# Patient Record
Sex: Female | Born: 1960
Health system: Southern US, Community
[De-identification: ages and names within clinical notes are randomized; demographics above are authoritative.]

## PROBLEM LIST (undated history)

## (undated) DIAGNOSIS — K529 Noninfective gastroenteritis and colitis, unspecified: Secondary | ICD-10-CM

## (undated) DIAGNOSIS — Z923 Personal history of irradiation: Secondary | ICD-10-CM

## (undated) DIAGNOSIS — J45909 Unspecified asthma, uncomplicated: Secondary | ICD-10-CM

## (undated) DIAGNOSIS — M1990 Unspecified osteoarthritis, unspecified site: Secondary | ICD-10-CM

## (undated) DIAGNOSIS — E119 Type 2 diabetes mellitus without complications: Secondary | ICD-10-CM

## (undated) DIAGNOSIS — I10 Essential (primary) hypertension: Secondary | ICD-10-CM

## (undated) DIAGNOSIS — C801 Malignant (primary) neoplasm, unspecified: Secondary | ICD-10-CM

## (undated) DIAGNOSIS — G629 Polyneuropathy, unspecified: Secondary | ICD-10-CM

## (undated) HISTORY — PX: TONSILLECTOMY: SUR1361

## (undated) HISTORY — DX: Personal history of irradiation: Z92.3

## (undated) HISTORY — PX: TOTAL HIP ARTHROPLASTY: SHX124

## (undated) HISTORY — PX: ABDOMINAL HYSTERECTOMY: SHX81

## (undated) HISTORY — DX: Noninfective gastroenteritis and colitis, unspecified: K52.9

## (undated) HISTORY — PX: KNEE ARTHROSCOPY: SUR90

## (undated) HISTORY — PX: DILATION AND CURETTAGE OF UTERUS: SHX78

---

## 1998-03-15 ENCOUNTER — Other Ambulatory Visit: Admission: RE | Admit: 1998-03-15 | Discharge: 1998-03-15 | Payer: Self-pay | Admitting: Gynecology

## 2001-01-06 ENCOUNTER — Other Ambulatory Visit: Admission: RE | Admit: 2001-01-06 | Discharge: 2001-01-06 | Payer: Self-pay | Admitting: Gynecology

## 2002-09-16 ENCOUNTER — Other Ambulatory Visit: Admission: RE | Admit: 2002-09-16 | Discharge: 2002-09-16 | Payer: Self-pay | Admitting: Gynecology

## 2002-09-18 ENCOUNTER — Other Ambulatory Visit: Admission: RE | Admit: 2002-09-18 | Discharge: 2002-09-18 | Payer: Self-pay | Admitting: Obstetrics and Gynecology

## 2003-03-02 ENCOUNTER — Other Ambulatory Visit: Admission: RE | Admit: 2003-03-02 | Discharge: 2003-03-02 | Payer: Self-pay | Admitting: Gynecology

## 2003-10-19 ENCOUNTER — Other Ambulatory Visit: Admission: RE | Admit: 2003-10-19 | Discharge: 2003-10-19 | Payer: Self-pay | Admitting: Gynecology

## 2004-10-06 ENCOUNTER — Ambulatory Visit: Payer: Self-pay | Admitting: Gastroenterology

## 2004-10-13 ENCOUNTER — Ambulatory Visit: Payer: Self-pay | Admitting: Gastroenterology

## 2004-10-13 ENCOUNTER — Encounter (INDEPENDENT_AMBULATORY_CARE_PROVIDER_SITE_OTHER): Payer: Self-pay | Admitting: *Deleted

## 2004-10-23 ENCOUNTER — Other Ambulatory Visit: Admission: RE | Admit: 2004-10-23 | Discharge: 2004-10-23 | Payer: Self-pay | Admitting: Gynecology

## 2004-11-13 ENCOUNTER — Ambulatory Visit: Payer: Self-pay | Admitting: Gastroenterology

## 2007-07-23 ENCOUNTER — Encounter (INDEPENDENT_AMBULATORY_CARE_PROVIDER_SITE_OTHER): Payer: Self-pay | Admitting: Obstetrics and Gynecology

## 2007-07-23 ENCOUNTER — Inpatient Hospital Stay (HOSPITAL_COMMUNITY): Admission: RE | Admit: 2007-07-23 | Discharge: 2007-07-25 | Payer: Self-pay | Admitting: Obstetrics and Gynecology

## 2008-08-07 ENCOUNTER — Encounter: Admission: RE | Admit: 2008-08-07 | Discharge: 2008-08-07 | Payer: Self-pay | Admitting: Orthopedic Surgery

## 2009-01-04 ENCOUNTER — Inpatient Hospital Stay (HOSPITAL_COMMUNITY): Admission: RE | Admit: 2009-01-04 | Discharge: 2009-01-08 | Payer: Self-pay | Admitting: Orthopedic Surgery

## 2009-09-09 ENCOUNTER — Encounter (INDEPENDENT_AMBULATORY_CARE_PROVIDER_SITE_OTHER): Payer: Self-pay | Admitting: *Deleted

## 2010-02-28 NOTE — Letter (Signed)
Summary: Colonoscopy Letter  [facility]  [place], [place] [address]   Phone: [phone]  Fax: [phone]      September 09, 2009 MRN: [id number]   [facility] [place] [address] [place], [place]  [address]   Dear Ms. PATIL[place]-Burkes,   According to your medical record, it is time for you to schedule a Colonoscopy. The American Cancer Society recommends this procedure as a method to detect early colon cancer. Patients with a family history of colon cancer, or a personal history of colon polyps or inflammatory bowel disease are at increased risk.  This letter has beeen generated based on the recommendations made at the time of your procedure. If you feel that in your particular situation this may no longer apply, please contact our office.  Please call our office at [phone] to schedule this appointment or to update your records at your earliest convenience.  Thank you for cooperating with [place] to provide you with the very best care possible.   Sincerely,  Norberto Sorenson T. Fuller Plan, M.D.  [facility] [phone]

## 2010-05-02 LAB — PROTIME-INR
INR: 1.4 (ref 0.00–1.49)
INR: 1.56 — ABNORMAL HIGH (ref 0.00–1.49)
Prothrombin Time: 18.5 seconds — ABNORMAL HIGH (ref 11.6–15.2)

## 2010-05-02 LAB — HEMOGLOBIN AND HEMATOCRIT, BLOOD
HCT: 31.5 % — ABNORMAL LOW (ref 36.0–46.0)
Hemoglobin: 10.5 g/dL — ABNORMAL LOW (ref 12.0–15.0)
Hemoglobin: 10.7 g/dL — ABNORMAL LOW (ref 12.0–15.0)
Hemoglobin: 11.7 g/dL — ABNORMAL LOW (ref 12.0–15.0)

## 2010-05-02 LAB — URINALYSIS, ROUTINE W REFLEX MICROSCOPIC
Hgb urine dipstick: NEGATIVE
Nitrite: NEGATIVE
Protein, ur: NEGATIVE mg/dL
Specific Gravity, Urine: 1.023 (ref 1.005–1.030)
Urobilinogen, UA: 0.2 mg/dL (ref 0.0–1.0)

## 2010-05-02 LAB — TYPE AND SCREEN
ABO/RH(D): O POS
Antibody Screen: NEGATIVE

## 2010-05-02 LAB — DIFFERENTIAL
Eosinophils Relative: 1 % (ref 0–5)
Lymphocytes Relative: 35 % (ref 12–46)
Lymphs Abs: 2.7 10*3/uL (ref 0.7–4.0)
Monocytes Absolute: 0.5 10*3/uL (ref 0.1–1.0)
Monocytes Relative: 6 % (ref 3–12)

## 2010-05-02 LAB — COMPREHENSIVE METABOLIC PANEL
ALT: 27 U/L (ref 0–35)
AST: 25 U/L (ref 0–37)
Albumin: 3.6 g/dL (ref 3.5–5.2)
Calcium: 9.2 mg/dL (ref 8.4–10.5)
Creatinine, Ser: 1.22 mg/dL — ABNORMAL HIGH (ref 0.4–1.2)
GFR calc Af Amer: 56 mL/min — ABNORMAL LOW (ref >60)
Sodium: 140 mEq/L (ref 135–145)

## 2010-05-02 LAB — CBC
MCHC: 33.3 g/dL (ref 30.0–36.0)
MCV: 88.5 fL (ref 78.0–100.0)
Platelets: 222 10*3/uL (ref 150–400)
WBC: 7.6 10*3/uL (ref 4.0–10.5)

## 2010-05-02 LAB — ABO/RH: ABO/RH(D): O POS

## 2010-06-13 NOTE — Op Note (Signed)
NAME:  Brandi Dickson, Brandi Dickson     ACCOUNT NO.:  [id number]   MEDICAL RECORD NO.:  [id number]          PATIENT TYPE:  INP   LOCATION:  9303                          FACILITY:  [facility]   PHYSICIAN:  Gus Height, M.D.       DATE OF BIRTH:  09/28/1960   DATE OF PROCEDURE:  07/23/2007  DATE OF DISCHARGE:                               OPERATIVE REPORT   PREOPERATIVE DIAGNOSES:  1. Menorrhagia.  2. Uterine fibroids.   POSTOPERATIVE DIAGNOSES:  1. Menorrhagia.  2. Uterine fibroids.   PROCEDURES:  Total abdominal hysterectomy, bilateral salpingo-  oophorectomy.   SURGEON:  Gus Height, MD.   ASSISTANT:  Barbaraann Rondo, MD.   ANESTHESIA:  General.   COMPLICATIONS:  None.   JUSTIFICATION:  The patient is a 50 year old white female with a long  history of menorrhagia and history of uterine fibroids.  The bleeding  and size and pain are associated with the fibroids, which is now  becoming an issue with the patient's quality of life and she has  requested definitive procedure be carried out to correct this problem.  Risks and benefits of total abdominal hysterectomy were discussed with  the patient and husband and informed consent has been obtained.   PROCEDURE:  The patient was taken to the operating room and placed in  supine position.  General anesthesia was administered without  difficulty.  She was then prepped and draped in the usual sterile  fashion.  Foley catheter was inserted.  At this point, a Pfannenstiel  incision was made, extended through the subcutaneous tissue with  bleeding points being clamped and coagulation were encountered.  The  fascia was then identified, incised transversely, and separated from the  underlying rectus muscles.  The rectus muscles were divided in midline.  The peritoneum was then found and entered carefully underlying  structures.  At this point, a self-retaining retractor was placed  through the wound.  The viscera was packed out of pelvis and  inspection  revealed a markedly enlarged uterus approximately 14-16 weeks'  equivalent size with many intramural and subserosal uterine fibroids,  fibroids extending into the broad ligament and onto the anterior lower  uterine segment.  At this point, the round ligaments were identified,  clamped, cut, and suture ligated using suture ligatures of 0 Vicryl and  a bladder flap was created anteriorly.  Perforations were then made  below the utero-ovarian ligaments.  The infundibulopelvic ligament was  then found as were the ureters where the ureters were identified and the  infundibulopelvic ligaments were clamped, cut, and suture ligated using  suture ligatures of 0 Vicryl.  The broad ligaments were then  skeletonized and additional bites were taken of the broad ligament  structures using curved Heaney clamps.  Uterine vessels were then found,  clamped, cut, and suture ligated in similar fashion.  At this point in  an effort to debulk the uterus to allow the surgery to proceed more  smoothly, the fundus was cut free from the cervix.  The cervix was then  grasped with Kocher clamps and held.  At this point, the bladder flap  was taken down further with sharp  dissection.  Then, using straight  Heaney clamps, serial bites were taken of the paracervical fascia.  All  pedicles were cut and suture ligated using suture ligatures of 0 Vicryl.  The cardinal ligaments and uterosacral ligaments were then clamped in  sequence using curved Heaney clamps.  Pedicles were cut and suture  ligated using suture ligatures of 0 Vicryl.  Once this was done, the  reflection of the cervix on the vagina was found using curved Heaney  clamps.  This was cross-cut.  Specimen of the cervix was excised.  The  cuff was then closed using 3-way suture of 0 Vicryl with the uterosacral  ligaments incorporated for support and then using interrupted stitch of  0 Vicryl sutures.  Inspection was made for hemostasis.  Hemostasis   appeared to be excellent.  At this point, lap counts and instrument  counts were taken down to be correct.  The abdomen was irrigated with  warm saline and then the abdomen was closed.  Parietal peritoneum was  closed using running continuous 0 Vicryl suture.  Rectus muscles were  reapproximated using running continuous 0 Vicryl suture.  The fascia was  then closed using two sutures of 0 Vicryl each starting at the lateral  fascial angles and meeting in the midline.  Subcutaneous tissue was  closed using interrupted 0 Vicryl sutures and the skin incision was  closed using subcuticular 3-0 Vicryl.  Estimated blood loss was  approximately 350 mL.  The patient tolerated the procedure well and went  to the recovery room in satisfactory condition.      Gus Height, M.D.  Electronically Signed     AR/MEDQ  D:  07/23/2007  T:  07/24/2007  Job:  [id number]

## 2010-06-16 NOTE — Discharge Summary (Signed)
NAME:  Brandi Dickson, Brandi Dickson     ACCOUNT NO.:  [id number]   MEDICAL RECORD NO.:  [id number]          PATIENT TYPE:  INP   LOCATION:  9303                          FACILITY:  [facility]   PHYSICIAN:  Gus Height, M.D.       DATE OF BIRTH:  November 15, 1960   DATE OF ADMISSION:  07/23/2007  DATE OF DISCHARGE:  07/25/2007                               DISCHARGE SUMMARY   ADMISSION DIAGNOSIS:  Symptomatic uterine fibroids.   OPERATIONS AND PROCEDURES:  Total abdominal hysterectomy and bilateral  salpingo-oophorectomy.   BRIEF HISTORY:  The patient is a 50 year old white female.  She is a  patient of Dr. Delene Loll who on clinical examination was noted to have  large uterine fibroids associated with pelvic pain and menorrhagia.  Due  to the symptoms associated with the large uterine fibroids, it was  decided that the patient was to undergo definitive therapy via total  abdominal hysterectomy and bilateral salpingo-oophorectomy and the risks  and benefits of this procedure were discussed with the patient.  She was  admitted on July 23, 2007.  After informed consent was obtained, the  patient underwent laparotomy with total abdominal hysterectomy and  bilateral salpingo-oophorectomy.  At the time laparotomy, the patient  was noted to have multiple large uterine fibroids both subserosal and  intramural.  The hysterectomy was carried without difficulty with  associated bilateral salpingo-oophorectomy and the patient underwent an  essentially uncomplicated postoperative course tolerating increasing  ambulation and diet well.  She was able to be discharged home by the  second postoperative day.  She remained afebrile.  Her hemoglobin  remained stable.  She was able to take a regular diet and void  spontaneously on her own.  The final pathology report on the  hysterectomy specimen revealed chronic cervicitis, benign proliferative  endometrium, multiple leiomyomas, the largest being 7 cm.  The right  ovary and tube revealed atrophic ovarian parenchyma.  The left ovary  revealed a hemorrhagic corpus luteum and a benign ovarian fibroma.  The  patient was discharged home on Tylox 1 every 3 or 4 hours as needed for  pain.  She was instructed to do no heavy lifting.  Call for any problems  such as fever, pain, or heavy bleeding.  She will be seen back in 4  weeks for followup examination.      Gus Height, M.D.  Electronically Signed     AR/MEDQ  D:  07/31/2007  T:  07/31/2007  Job:  [id number]

## 2010-10-26 LAB — URINALYSIS, ROUTINE W REFLEX MICROSCOPIC
Bilirubin Urine: NEGATIVE
Ketones, ur: NEGATIVE
Nitrite: NEGATIVE
Specific Gravity, Urine: 1.01
Urobilinogen, UA: 0.2

## 2010-10-26 LAB — COMPREHENSIVE METABOLIC PANEL
AST: 26
CO2: 31
Calcium: 9.2
Creatinine, Ser: 0.66
GFR calc Af Amer: >60
GFR calc non Af Amer: >60

## 2010-10-26 LAB — CBC
MCHC: 33
MCHC: 33.2
MCHC: 33.5
MCV: 89.6
MCV: 90.6
RBC: 3.69 — ABNORMAL LOW
RBC: 4.81
RDW: 13.4
RDW: 13.5
RDW: 13.6

## 2010-10-26 LAB — PREGNANCY, URINE: Preg Test, Ur: NEGATIVE

## 2010-10-26 LAB — PROTIME-INR: Prothrombin Time: 12.1

## 2010-10-26 LAB — APTT: aPTT: 29

## 2011-10-03 ENCOUNTER — Encounter: Payer: Self-pay | Admitting: Gastroenterology

## 2013-03-03 ENCOUNTER — Other Ambulatory Visit (HOSPITAL_BASED_OUTPATIENT_CLINIC_OR_DEPARTMENT_OTHER): Payer: Self-pay | Admitting: Physician Assistant

## 2013-03-03 DIAGNOSIS — R229 Localized swelling, mass and lump, unspecified: Secondary | ICD-10-CM

## 2013-03-04 ENCOUNTER — Ambulatory Visit (HOSPITAL_BASED_OUTPATIENT_CLINIC_OR_DEPARTMENT_OTHER)
Admission: RE | Admit: 2013-03-04 | Discharge: 2013-03-04 | Disposition: A | Discharge status: Home / Self Care | Payer: 59 | Source: Ambulatory Visit | Attending: Physician Assistant | Admitting: Physician Assistant

## 2013-03-04 ENCOUNTER — Ambulatory Visit (HOSPITAL_BASED_OUTPATIENT_CLINIC_OR_DEPARTMENT_OTHER): Payer: 59

## 2013-03-04 DIAGNOSIS — R229 Localized swelling, mass and lump, unspecified: Secondary | ICD-10-CM

## 2013-03-04 DIAGNOSIS — R222 Localized swelling, mass and lump, trunk: Secondary | ICD-10-CM | POA: Insufficient documentation

## 2014-09-21 ENCOUNTER — Emergency Department (HOSPITAL_COMMUNITY): Payer: 59

## 2014-09-21 ENCOUNTER — Emergency Department (HOSPITAL_COMMUNITY)
Admission: EM | Admit: 2014-09-21 | Discharge: 2014-09-21 | Disposition: A | Discharge status: Home / Self Care | Payer: 59 | Attending: Emergency Medicine | Admitting: Emergency Medicine

## 2014-09-21 ENCOUNTER — Encounter (HOSPITAL_COMMUNITY): Payer: Self-pay | Admitting: Emergency Medicine

## 2014-09-21 DIAGNOSIS — Z8669 Personal history of other diseases of the nervous system and sense organs: Secondary | ICD-10-CM | POA: Diagnosis not present

## 2014-09-21 DIAGNOSIS — E876 Hypokalemia: Secondary | ICD-10-CM

## 2014-09-21 DIAGNOSIS — R0789 Other chest pain: Secondary | ICD-10-CM

## 2014-09-21 DIAGNOSIS — R079 Chest pain, unspecified: Secondary | ICD-10-CM | POA: Diagnosis present

## 2014-09-21 DIAGNOSIS — E119 Type 2 diabetes mellitus without complications: Secondary | ICD-10-CM | POA: Diagnosis not present

## 2014-09-21 DIAGNOSIS — I10 Essential (primary) hypertension: Secondary | ICD-10-CM | POA: Insufficient documentation

## 2014-09-21 DIAGNOSIS — R112 Nausea with vomiting, unspecified: Secondary | ICD-10-CM | POA: Diagnosis not present

## 2014-09-21 DIAGNOSIS — R002 Palpitations: Secondary | ICD-10-CM | POA: Diagnosis not present

## 2014-09-21 HISTORY — DX: Polyneuropathy, unspecified: G62.9

## 2014-09-21 HISTORY — DX: Type 2 diabetes mellitus without complications: E11.9

## 2014-09-21 HISTORY — DX: Essential (primary) hypertension: I10

## 2014-09-21 LAB — BASIC METABOLIC PANEL
ANION GAP: 10 (ref 5–15)
BUN: 19 mg/dL (ref 6–20)
CALCIUM: 9 mg/dL (ref 8.9–10.3)
CO2: 24 mmol/L (ref 22–32)
CREATININE: 1.03 mg/dL — AB (ref 0.44–1.00)
Chloride: 100 mmol/L — ABNORMAL LOW (ref 101–111)
Glucose, Bld: 139 mg/dL — ABNORMAL HIGH (ref 65–99)
Potassium: 3.3 mmol/L — ABNORMAL LOW (ref 3.5–5.1)
SODIUM: 134 mmol/L — AB (ref 135–145)

## 2014-09-21 LAB — TROPONIN I

## 2014-09-21 LAB — CBC
HCT: 42.6 % (ref 36.0–46.0)
HEMOGLOBIN: 14.3 g/dL (ref 12.0–15.0)
MCH: 29.5 pg (ref 26.0–34.0)
MCHC: 33.6 g/dL (ref 30.0–36.0)
MCV: 88 fL (ref 78.0–100.0)
PLATELETS: 206 10*3/uL (ref 150–400)
RBC: 4.84 MIL/uL (ref 3.87–5.11)
RDW: 13.2 % (ref 11.5–15.5)
WBC: 9 10*3/uL (ref 4.0–10.5)

## 2014-09-21 MED ORDER — POTASSIUM CHLORIDE CRYS ER 20 MEQ PO TBCR
40.0000 meq | EXTENDED_RELEASE_TABLET | Freq: Once | ORAL | Status: AC
  Administered 2014-09-21: 40 meq via ORAL
  Filled 2014-09-21: qty 2

## 2014-09-21 MED ORDER — ONDANSETRON HCL 8 MG PO TABS: 8.0000 mg | ORAL_TABLET | Freq: Three times a day (TID) | ORAL | Status: DC | PRN

## 2014-09-21 NOTE — ED Provider Notes (Signed)
CSN: [id number]     Arrival date & time 09/21/14  0351 History   First MD Initiated Contact with Patient 09/21/14 [phone]     Chief Complaint  Patient presents with  . Chest Pain     (Consider location/radiation/quality/duration/timing/severity/associated sxs/prior Treatment) HPI Comments: Brandi Dickson is a 54 y.o. female with a PMHx of HTN, DM2, and neuropathy, who presents to the ED with complaints of palpitations and nausea that began around 2:30 AM. She states that 2 weeks ago she started taking lisinopril, and she noticed that about 15-20 minutes after she takes the medication she has these symptoms. Last night she states that she took the medicine at 10 PM, and awoke at 2:30 AM with a sensation that she could "feel her heart beating in her entire body". She denies that this is a fast heartbeat, or irregular, and that sensation lasted approximately 1-1/2 hours. She had one episode of nonbloody nonbilious emesis and some mild chest pressure that gradually developed after the onset of her original symptoms. She described this chest pressure is 1/10 centrally located nonradiating constant pressure which lasted approximately 1 hour and then resolved, with no aggravating or alleviating factors. Associated symptoms also include mild lightheadedness when her original symptoms occurred, but this also resolved. Upon evaluation all of her symptoms had resolved on their own with no interventions. She denies any fevers, chills, cough, shortness of breath, leg swelling, recent travel/surgery/immobilization, history of DVT/PE, estrogen use, claudication, orthopnea, diaphoresis, abdominal pain, hematemesis, diarrhea, constipation, dysuria, hematuria, numbness, tingling, or weakness. She is a nonsmoker. She has a family history of MI in her maternal aunt and paternal uncle.  Patient is a 54 y.o. female presenting with chest pain. The history is provided by the patient. No language interpreter was used.  Chest  Pain Pain location:  Substernal area Pain quality: pressure   Pain radiates to:  Does not radiate Pain radiates to the back: no   Pain severity:  Mild Onset quality:  Gradual Duration:  1 hour Timing:  Constant Progression:  Resolved Chronicity:  New Context: at rest   Relieved by:  None tried Worsened by:  Nothing tried Ineffective treatments:  None tried Associated symptoms: nausea, palpitations and vomiting (x1)   Associated symptoms: no abdominal pain, no claudication, no cough, no diaphoresis, no fever, no lower extremity edema, no numbness, no orthopnea, no shortness of breath and no weakness   Risk factors: diabetes mellitus, hypertension and obesity   Risk factors: no birth control, no coronary artery disease, no high cholesterol, no immobilization, no prior DVT/PE, no smoking and no surgery     Past Medical History  Diagnosis Date  . Hypertension   . Diabetes mellitus without complication   . Neuropathy    Past Surgical History  Procedure Laterality Date  . Total hip arthroplasty Right   . Abdominal hysterectomy     No family history on file. Social History  Substance Use Topics  . Smoking status: Never Smoker   . Smokeless tobacco: None  . Alcohol Use: No   OB History    No data available     Review of Systems  Constitutional: Negative for fever, chills and diaphoresis.  Respiratory: Negative for cough and shortness of breath.   Cardiovascular: Positive for chest pain (pressure, mild, resolved) and palpitations. Negative for orthopnea and claudication.  Gastrointestinal: Positive for nausea and vomiting (x1). Negative for abdominal pain, diarrhea and constipation.  Genitourinary: Negative for dysuria and hematuria.  Musculoskeletal: Negative for myalgias  and arthralgias.  Skin: Negative for color change.  Allergic/Immunologic: Positive for immunocompromised state (diabetic).  Neurological: Positive for light-headedness (brief, now resolved). Negative for  weakness and numbness.  Psychiatric/Behavioral: Negative for confusion.   10 Systems reviewed and are negative for acute change except as noted in the HPI.    Allergies  Augmentin  Home Medications   Prior to Admission medications   Not on File   BP 110/58 mmHg  Pulse 88  Temp(Src) 97.9 F (36.6 C) (Oral)  Resp 14  Ht 5\' 5"  (1.651 m)  Wt 242 lb 12.8 oz (110.133 kg)  BMI 40.40 kg/m2  SpO2 97% Physical Exam  Constitutional: She is oriented to person, place, and time. Vital signs are normal. She appears well-developed and well-nourished.  Non-toxic appearance. No distress.  Afebrile, nontoxic, NAD  HENT:  Head: Normocephalic and atraumatic.  Mouth/Throat: Oropharynx is clear and moist and mucous membranes are normal.  Eyes: Conjunctivae and EOM are normal. Right eye exhibits no discharge. Left eye exhibits no discharge.  Neck: Normal range of motion. Neck supple.  Cardiovascular: Normal rate, regular rhythm, normal heart sounds and intact distal pulses.  Exam reveals no gallop and no friction rub.   No murmur heard. RRR, nl s1/s2, no m/r/g, distal pulses intact, no pedal edema   Pulmonary/Chest: Effort normal and breath sounds normal. No respiratory distress. She has no decreased breath sounds. She has no wheezes. She has no rhonchi. She has no rales. She exhibits no tenderness, no crepitus, no deformity and no retraction.  CTAB in all lung fields, no w/r/r, no hypoxia or increased WOB, speaking in full sentences, SpO2 97% on RA  No chest wall TTP, crepitus, deformity, or retractions  Abdominal: Soft. Normal appearance and bowel sounds are normal. She exhibits no distension. There is no tenderness. There is no rigidity, no rebound, no guarding, no CVA tenderness, no tenderness at McBurney's point and negative Murphy's sign.  Musculoskeletal: Normal range of motion.  MAE x4 Strength and sensation grossly intact Distal pulses intact No pedal edema, neg homan's bilaterally    Neurological: She is alert and oriented to person, place, and time. She has normal strength. No sensory deficit.  Skin: Skin is warm, dry and intact. No rash noted.  Psychiatric: She has a normal mood and affect.  Nursing note and vitals reviewed.   ED Course  Procedures (including critical care time) Labs Review Labs Reviewed  BASIC METABOLIC PANEL - Abnormal; Notable for the following:    Sodium 134 (*)    Potassium 3.3 (*)    Chloride 100 (*)    Glucose, Bld 139 (*)    Creatinine, Ser 1.03 (*)    All other components within normal limits  CBC  TROPONIN I  TROPONIN I    Imaging Review Dg Chest 2 View  09/21/2014   CLINICAL DATA:  Chest pain and nausea. Symptoms for 2 weeks. Symptoms primarily after taking anti hypertensive medication.  EXAM: CHEST  2 VIEW  COMPARISON:  None.  FINDINGS: The cardiomediastinal contours are normal. The lungs are clear. Pulmonary vasculature is normal. No consolidation, pleural effusion, or pneumothorax. No acute osseous abnormalities are seen. Mild degenerative change in the spine.  IMPRESSION: No acute pulmonary process.   Electronically Signed   By: Jeb Levering M.D.   On: 09/21/2014 04:47   I have personally reviewed and evaluated these images and lab results as part of my medical decision-making.   EKG Interpretation   Date/Time:  Tuesday September 21 2014 03:57:41 EDT Ventricular Rate:  109 PR Interval:  174 QRS Duration: 80 QT Interval:  338 QTC Calculation: 455 R Axis:   95 Text Interpretation:  Sinus tachycardia Rightward axis Low voltage QRS  Septal infarct , age undetermined Abnormal ECG No significant change since  last tracing Confirmed by Christy Gentles  MD, DONALD ([id number]) on 09/21/2014  5:03:28 AM      MDM   Final diagnoses:  Atypical chest pain  Palpitations  Non-intractable vomiting with nausea, vomiting of unspecified type  Hypokalemia    54 y.o. female here with complaints of palpitations and nausea with 1 episode of  vomiting around 2:30am. Similar symptoms since starting 2wks ago when she started lisinopril. Today she had a brief episode of mild chest pressure which resolved on its own. She feels well upon evaluation. Risk factors: diabetic, some family hx. Nonsmoker. No SOB complaint although this was documented by triage. No ongoing tachycardia (only one documented HR>100, none since) or hypoxia, doubt PE. BMP showing mildly low K, will replete. Otherwise labs, CXR, and EKG unremarkable/unchanged. All symptoms resolved at this time. Given some risk factors, will repeat trop at 1hr mark. If neg, likely could go home with outpt f/up. Will reassess shortly.  8:08 AM  2nd trop neg. Still no ongoing symptoms, with no interventions given here. Likely med reaction. Will have her f/up with her PCP in 3-5 days. Will send home with zofran. Discussed good hydration. Possible holter monitoring as outpt if palpitations continues. I explained the diagnosis and have given explicit precautions to return to the ER including for any other new or worsening symptoms. The patient understands and accepts the medical plan as it's been dictated and I have answered their questions. Discharge instructions concerning home care and prescriptions have been given. The patient is STABLE and is discharged to home in good condition.   BP 110/58 mmHg  Pulse 88  Temp(Src) 97.9 F (36.6 C) (Oral)  Resp 14  Ht 5\' 5"  (1.651 m)  Wt 242 lb 12.8 oz (110.133 kg)  BMI 40.40 kg/m2  SpO2 97%  Meds ordered this encounter  Medications  . potassium chloride SA (K-DUR,KLOR-CON) CR tablet 40 mEq    Sig:   . ondansetron (ZOFRAN) 8 MG tablet    Sig: Take 1 tablet (8 mg total) by mouth every 8 (eight) hours as needed for nausea or vomiting.    Dispense:  10 tablet    Refill:  0    Order Specific Question:  Supervising Provider    Answer:  Noemi Chapel [[id number]]     Brandi Montellano Camprubi-Soms, PA-C 09/21/14 [place], DO 09/21/14 1504

## 2014-09-21 NOTE — Discharge Instructions (Signed)
Your symptoms could be related to your recent change in medication. Use zofran as directed for nausea, stay well hydrated, and check your blood pressure and blood sugar regularly. Follow up with your regular doctor in 5 days for recheck and ongoing management, and potentially to schedule more outpatient testing. Return to the ER for changes or worsening symptoms.   Chest Pain (Nonspecific) It is often hard to give a diagnosis for the cause of chest pain. There is always a chance that your pain could be related to something serious, such as a heart attack or a blood clot in the lungs. You need to follow up with your doctor. HOME CARE  If antibiotic medicine was given, take it as directed by your doctor. Finish the medicine even if you start to feel better.  For the next few days, avoid activities that bring on chest pain. Continue physical activities as told by your doctor.  Do not use any tobacco products. This includes cigarettes, chewing tobacco, and e-cigarettes.  Avoid drinking alcohol.  Only take medicine as told by your doctor.  Follow your doctor's suggestions for more testing if your chest pain does not go away.  Keep all doctor visits you made. GET HELP IF:  Your chest pain does not go away, even after treatment.  You have a rash with blisters on your chest.  You have a fever. GET HELP RIGHT AWAY IF:   You have more pain or pain that spreads to your arm, neck, jaw, back, or belly (abdomen).  You have shortness of breath.  You cough more than usual or cough up blood.  You have very bad back or belly pain.  You feel sick to your stomach (nauseous) or throw up (vomit).  You have very bad weakness.  You pass out (faint).  You have chills. This is an emergency. Do not wait to see if the problems will go away. Call your local emergency services (911 in [place] Do not drive yourself to the hospital. MAKE SURE YOU:   Understand these instructions.  Will watch your  condition.  Will get help right away if you are not doing well or get worse. Document Released: 07/04/2007 Document Revised: 01/20/2013 Document Reviewed: 07/04/2007 [facility] Patient Information 2015 [place], [place]. This information is not intended to replace advice given to you by your health care provider. Make sure you discuss any questions you have with your health care provider.  Nausea and Vomiting Nausea is a sick feeling that often comes before throwing up (vomiting). Vomiting is a reflex where stomach contents come out of your mouth. Vomiting can cause severe loss of body fluids (dehydration). Children and elderly adults can become dehydrated quickly, especially if they also have diarrhea. Nausea and vomiting are symptoms of a condition or disease. It is important to find the cause of your symptoms. CAUSES   Direct irritation of the stomach lining. This irritation can result from increased acid production (gastroesophageal reflux disease), infection, food poisoning, taking certain medicines (such as nonsteroidal anti-inflammatory drugs), alcohol use, or tobacco use.  Signals from the brain.These signals could be caused by a headache, heat exposure, an inner ear disturbance, increased pressure in the brain from injury, infection, a tumor, or a concussion, pain, emotional stimulus, or metabolic problems.  An obstruction in the gastrointestinal tract (bowel obstruction).  Illnesses such as diabetes, hepatitis, gallbladder problems, appendicitis, kidney problems, cancer, sepsis, atypical symptoms of a heart attack, or eating disorders.  Medical treatments such as chemotherapy and radiation.  Receiving medicine that makes you sleep (general anesthetic) during surgery. DIAGNOSIS Your caregiver may ask for tests to be done if the problems do not improve after a few days. Tests may also be done if symptoms are severe or if the reason for the nausea and vomiting is not clear. Tests may  include:  Urine tests.  Blood tests.  Stool tests.  Cultures (to look for evidence of infection).  X-rays or other imaging studies. Test results can help your caregiver make decisions about treatment or the need for additional tests. TREATMENT You need to stay well hydrated. Drink frequently but in small amounts.You may wish to drink water, sports drinks, clear broth, or eat frozen ice pops or gelatin dessert to help stay hydrated.When you eat, eating slowly may help prevent nausea.There are also some antinausea medicines that may help prevent nausea. HOME CARE INSTRUCTIONS   Take all medicine as directed by your caregiver.  If you do not have an appetite, do not force yourself to eat. However, you must continue to drink fluids.  If you have an appetite, eat a normal diet unless your caregiver tells you differently.  Eat a variety of complex carbohydrates (rice, wheat, potatoes, bread), lean meats, yogurt, fruits, and vegetables.  Avoid high-fat foods because they are more difficult to digest.  Drink enough water and fluids to keep your urine clear or pale yellow.  If you are dehydrated, ask your caregiver for specific rehydration instructions. Signs of dehydration may include:  Severe thirst.  Dry lips and mouth.  Dizziness.  Dark urine.  Decreasing urine frequency and amount.  Confusion.  Rapid breathing or pulse. SEEK IMMEDIATE MEDICAL CARE IF:   You have blood or brown flecks (like coffee grounds) in your vomit.  You have black or bloody stools.  You have a severe headache or stiff neck.  You are confused.  You have severe abdominal pain.  You have chest pain or trouble breathing.  You do not urinate at least once every 8 hours.  You develop cold or clammy skin.  You continue to vomit for longer than 24 to 48 hours.  You have a fever. MAKE SURE YOU:   Understand these instructions.  Will watch your condition.  Will get help right away if  you are not doing well or get worse. Document Released: 01/15/2005 Document Revised: 04/09/2011 Document Reviewed: 06/14/2010 [facility] Patient Information 2015 [place], [place]. This information is not intended to replace advice given to you by your health care provider. Make sure you discuss any questions you have with your health care provider.  Hypokalemia Hypokalemia means that the amount of potassium in the blood is lower than normal.Potassium is a chemical, called an electrolyte, that helps regulate the amount of fluid in the body. It also stimulates muscle contraction and helps nerves function properly.Most of the body's potassium is inside of cells, and only a very small amount is in the blood. Because the amount in the blood is so small, minor changes can be life-threatening. CAUSES  Antibiotics.  Diarrhea or vomiting.  Using laxatives too much, which can cause diarrhea.  Chronic kidney disease.  Water pills (diuretics).  Eating disorders (bulimia).  Low magnesium level.  Sweating a lot. SIGNS AND SYMPTOMS  Weakness.  Constipation.  Fatigue.  Muscle cramps.  Mental confusion.  Skipped heartbeats or irregular heartbeat (palpitations).  Tingling or numbness. DIAGNOSIS  Your health care provider can diagnose hypokalemia with blood tests. In addition to checking your potassium level, your health care  provider may also check other lab tests. TREATMENT Hypokalemia can be treated with potassium supplements taken by mouth or adjustments in your current medicines. If your potassium level is very low, you may need to get potassium through a vein (IV) and be monitored in the hospital. A diet high in potassium is also helpful. Foods high in potassium are:  Nuts, such as peanuts and pistachios.  Seeds, such as sunflower seeds and pumpkin seeds.  Peas, lentils, and lima beans.  Whole grain and bran cereals and breads.  Fresh fruit and vegetables, such as apricots,  avocado, bananas, cantaloupe, kiwi, oranges, tomatoes, asparagus, and potatoes.  Orange and tomato juices.  Red meats.  Fruit yogurt. HOME CARE INSTRUCTIONS  Take all medicines as prescribed by your health care provider.  Maintain a healthy diet by including nutritious food, such as fruits, vegetables, nuts, whole grains, and lean meats.  If you are taking a laxative, be sure to follow the directions on the label. SEEK MEDICAL CARE IF:  Your weakness gets worse.  You feel your heart pounding or racing.  You are vomiting or having diarrhea.  You are diabetic and having trouble keeping your blood glucose in the normal range. SEEK IMMEDIATE MEDICAL CARE IF:  You have chest pain, shortness of breath, or dizziness.  You are vomiting or having diarrhea for more than 2 days.  You faint. MAKE SURE YOU:   Understand these instructions.  Will watch your condition.  Will get help right away if you are not doing well or get worse. Document Released: 01/15/2005 Document Revised: 11/05/2012 Document Reviewed: 07/18/2012 [facility] Patient Information 2015 [place], [place]. This information is not intended to replace advice given to you by your health care provider. Make sure you discuss any questions you have with your health care provider.  Holter Monitoring A Holter monitor is a small device with electrodes (small sticky patches) that attach to your chest. It records the electrical activity of your heart and is worn continuously for 24-48 hours.  A HOLTER MONITOR IS USED TO  Detect heart problems such as:  Heart arrhythmia. Is an abnormal or irregular heartbeat. With some heart arrhythmias, you may not feel or know that you have an irregular heart rhythm.  Palpitations, such as feeling your heart racing or fluttering. It is possible to have heart palpitations and not have a heart arrhythmia.  A heart rhythm that is too slow or too fast.  If you have problems fainting, near  fainting or feeling light-headed, a Holter monitor may be worn to see if your heart is the cause. HOLTER MONITOR PREPARATION   Electrodes will be attached to the skin on your chest.  If you have hair on your chest, small areas may have to be shaved. This is done to help the patches stick better and make the recording more accurate.  The electrodes are attached by wires to the Holter monitor. The Holter monitor clips to your clothing. You will wear the monitor at all times, even while exercising and sleeping. HOME CARE INSTRUCTIONS   Wear your monitor at all times.  The wires and the monitor must stay dry. Do not get the monitor wet.  Do not bathe, swim or use a hot tub with it on.  You may do a "sponge" bath while you have the monitor on.  Keep your skin clean, do not put body lotion or moisturizer on your chest.  It's possible that your skin under the electrodes could become irritated. To keep  this from happening, you may put the electrodes in slightly different places on your chest.  Your caregiver will also ask you to keep a diary of your activities, such as walking or doing chores. Be sure to note what you are doing if you experience heart symptoms such as palpitations. This will help your caregiver determine what might be contributing to your symptoms. The information stored in your monitor will be reviewed by your caregiver alongside your diary entries.  Make sure the monitor is safely clipped to your clothing or in a location close to your body that your caregiver recommends.  The monitor and electrodes are removed when the test is over. Return the monitor as directed.  Be sure to follow up with your caregiver and discuss your Holter monitor results. SEEK IMMEDIATE MEDICAL CARE IF:  You faint or feel lightheaded.  You have trouble breathing.  You get pain in your chest, upper arm or jaw.  You feel sick to your stomach and your skin is pale, cool, or damp.  You think  something is wrong with the way your heart is beating. MAKE SURE YOU:   Understand these instructions.  Will watch your condition.  Will get help right away if you are not doing well or get worse. Document Released: 10/14/2003 Document Revised: 04/09/2011 Document Reviewed: 02/26/2008 [facility] Patient Information 2015 [place], [place]. This information is not intended to replace advice given to you by your health care provider. Make sure you discuss any questions you have with your health care provider.  Palpitations A palpitation is the feeling that your heartbeat is irregular. It may feel like your heart is fluttering or skipping a beat. It may also feel like your heart is beating faster than normal. This is usually not a serious problem. In some cases, you may need more medical tests. HOME CARE  Avoid:  Caffeine in coffee, tea, soft drinks, diet pills, and energy drinks.  Chocolate.  Alcohol.  Stop smoking if you smoke.  Reduce your stress and anxiety. Try:  A method that measures bodily functions so you can learn to control them (biofeedback).  Yoga.  Meditation.  Physical activity such as swimming, jogging, or walking.  Get plenty of rest and sleep. GET HELP IF:  Your fast or irregular heartbeat continues after 24 hours.  Your palpitations occur more often. GET HELP RIGHT AWAY IF:   You have chest pain.  You feel short of breath.  You have a very bad headache.  You feel dizzy or pass out (faint). MAKE SURE YOU:   Understand these instructions.  Will watch your condition.  Will get help right away if you are not doing well or get worse. Document Released: 10/25/2007 Document Revised: 06/01/2013 Document Reviewed: 03/16/2011 [facility] Patient Information 2015 [place], [place]. This information is not intended to replace advice given to you by your health care provider. Make sure you discuss any questions you have with your health care provider.  Potassium  Content of Foods Potassium is a mineral found in many foods and drinks. It helps keep fluids and minerals balanced in your body and affects how steadily your heart beats. Potassium also helps control your blood pressure and keep your muscles and nervous system healthy. Certain health conditions and medicines may change the balance of potassium in your body. When this happens, you can help balance your level of potassium through the foods that you do or do not eat. Your health care provider or dietitian may recommend an amount of  potassium that you should have each day. The following lists of foods provide the amount of potassium (in parentheses) per serving in each item. HIGH IN POTASSIUM  The following foods and beverages have 200 mg or more of potassium per serving:  Apricots, 2 raw or 5 dry (200 mg).  Artichoke, 1 medium (345 mg).  Avocado, raw,  each (245 mg).  Banana, 1 medium (425 mg).  Beans, lima, or baked beans, canned,  cup (280 mg).  Beans, white, canned,  cup (595 mg).  Beef roast, 3 oz (320 mg).  Beef, ground, 3 oz (270 mg).  Beets, raw or cooked,  cup (260 mg).  Bran muffin, 2 oz (300 mg).  Broccoli,  cup (230 mg).  Brussels sprouts,  cup (250 mg).  Cantaloupe,  cup (215 mg).  Cereal, 100% bran,  cup (200-400 mg).  Cheeseburger, single, fast food, 1 each (225-400 mg).  Chicken, 3 oz (220 mg).  Clams, canned, 3 oz (535 mg).  Crab, 3 oz (225 mg).  Dates, 5 each (270 mg).  Dried beans and peas,  cup (300-475 mg).  Figs, dried, 2 each (260 mg).  Fish: halibut, tuna, cod, snapper, 3 oz (480 mg).  Fish: salmon, haddock, swordfish, perch, 3 oz (300 mg).  Fish, tuna, canned 3 oz (200 mg).  [place] fries, fast food, 3 oz (470 mg).  Granola with fruit and nuts,  cup (200 mg).  Grapefruit juice,  cup (200 mg).  Greens, beet,  cup (655 mg).  Honeydew melon,  cup (200 mg).  Kale, raw, 1 cup (300 mg).  Kiwi, 1 medium (240  mg).  Kohlrabi, rutabaga, parsnips,  cup (280 mg).  Lentils,  cup (365 mg).  Mango, 1 each (325 mg).  Milk, chocolate, 1 cup (420 mg).  Milk: nonfat, low-fat, whole, buttermilk, 1 cup (350-380 mg).  Molasses, 1 Tbsp (295 mg).  Mushrooms,  cup (280) mg.  Nectarine, 1 each (275 mg).  Nuts: almonds, peanuts, hazelnuts, [place], cashew, mixed, 1 oz (200 mg).  Nuts, pistachios, 1 oz (295 mg).  Orange, 1 each (240 mg).  Orange juice,  cup (235 mg).  Papaya, medium,  fruit (390 mg).  Peanut butter, chunky, 2 Tbsp (240 mg).  Peanut butter, smooth, 2 Tbsp (210 mg).  Pear, 1 medium (200 mg).  Pomegranate, 1 whole (400 mg).  Pomegranate juice,  cup (215 mg).  Pork, 3 oz (350 mg).  Potato chips, salted, 1 oz (465 mg).  Potato, baked with skin, 1 medium (925 mg).  Potatoes, boiled,  cup (255 mg).  Potatoes, mashed,  cup (330 mg).  Prune juice,  cup (370 mg).  Prunes, 5 each (305 mg).  Pudding, chocolate,  cup (230 mg).  Pumpkin, canned,  cup (250 mg).  Raisins, seedless,  cup (270 mg).  Seeds, sunflower or pumpkin, 1 oz (240 mg).  Soy milk, 1 cup (300 mg).  Spinach,  cup (420 mg).  Spinach, canned,  cup (370 mg).  Sweet potato, baked with skin, 1 medium (450 mg).  Swiss chard,  cup (480 mg).  Tomato or vegetable juice,  cup (275 mg).  Tomato sauce or puree,  cup (400-550 mg).  Tomato, raw, 1 medium (290 mg).  Tomatoes, canned,  cup (200-300 mg).  [place], 3 oz (250 mg).  Wheat germ, 1 oz (250 mg).  Winter squash,  cup (250 mg).  Yogurt, plain or fruited, 6 oz (260-435 mg).  Zucchini,  cup (220 mg). MODERATE IN POTASSIUM The following foods and beverages have  50-200 mg of potassium per serving:  Apple, 1 each (150 mg).  Apple juice,  cup (150 mg).  Applesauce,  cup (90 mg).  Apricot nectar,  cup (140 mg).  Asparagus, small spears,  cup or 6 spears (155 mg).  Bagel, cinnamon raisin, 1 each (130 mg).  Bagel,  egg or plain, 4 in., 1 each (70 mg).  Beans, green,  cup (90 mg).  Beans, yellow,  cup (190 mg).  Beer, regular, 12 oz (100 mg).  Beets, canned,  cup (125 mg).  Blackberries,  cup (115 mg).  Blueberries,  cup (60 mg).  Bread, whole wheat, 1 slice (70 mg).  Broccoli, raw,  cup (145 mg).  Cabbage,  cup (150 mg).  Carrots, cooked or raw,  cup (180 mg).  Cauliflower, raw,  cup (150 mg).  Celery, raw,  cup (155 mg).  Cereal, bran flakes, cup (120-150 mg).  Cheese, cottage,  cup (110 mg).  Cherries, 10 each (150 mg).  Chocolate, 1 oz bar (165 mg).  Coffee, brewed 6 oz (90 mg).  Corn,  cup or 1 ear (195 mg).  Cucumbers,  cup (80 mg).  Egg, large, 1 each (60 mg).  Eggplant,  cup (60 mg).  Endive, raw, cup (80 mg).  English muffin, 1 each (65 mg).  Fish, orange roughy, 3 oz (150 mg).  Frankfurter, beef or pork, 1 each (75 mg).  Fruit cocktail,  cup (115 mg).  Grape juice,  cup (170 mg).  Grapefruit,  fruit (175 mg).  Grapes,  cup (155 mg).  Greens: kale, turnip, collard,  cup (110-150 mg).  Ice cream or frozen yogurt, chocolate,  cup (175 mg).  Ice cream or frozen yogurt, vanilla,  cup (120-150 mg).  Lemons, limes, 1 each (80 mg).  Lettuce, all types, 1 cup (100 mg).  Mixed vegetables,  cup (150 mg).  Mushrooms, raw,  cup (110 mg).  Nuts: walnuts, pecans, or macadamia, 1 oz (125 mg).  Oatmeal,  cup (80 mg).  Okra,  cup (110 mg).  Onions, raw,  cup (120 mg).  Peach, 1 each (185 mg).  Peaches, canned,  cup (120 mg).  Pears, canned,  cup (120 mg).  Peas, green, frozen,  cup (90 mg).  Peppers, green,  cup (130 mg).  Peppers, red,  cup (160 mg).  Pineapple juice,  cup (165 mg).  Pineapple, fresh or canned,  cup (100 mg).  Plums, 1 each (105 mg).  Pudding, vanilla,  cup (150 mg).  Raspberries,  cup (90 mg).  Rhubarb,  cup (115 mg).  Rice, wild,  cup (80 mg).  Shrimp, 3 oz (155  mg).  Spinach, raw, 1 cup (170 mg).  Strawberries,  cup (125 mg).  Summer squash  cup (175-200 mg).  Swiss chard, raw, 1 cup (135 mg).  Tangerines, 1 each (140 mg).  Tea, brewed, 6 oz (65 mg).  Turnips,  cup (140 mg).  Watermelon,  cup (85 mg).  Wine, red, table, 5 oz (180 mg).  Wine, white, table, 5 oz (100 mg). LOW IN POTASSIUM The following foods and beverages have less than 50 mg of potassium per serving.  Bread, white, 1 slice (30 mg).  Carbonated beverages, 12 oz (less than 5 mg).  Cheese, 1 oz (20-30 mg).  Cranberries,  cup (45 mg).  Cranberry juice cocktail,  cup (20 mg).  Fats and oils, 1 Tbsp (less than 5 mg).  Hummus, 1 Tbsp (32 mg).  Nectar: papaya, mango, or pear,  cup (35 mg).  Rice, white or brown,  cup (50 mg).  Spaghetti or macaroni,  cup cooked (30 mg).  Tortilla, flour or corn, 1 each (50 mg).  Waffle, 4 in., 1 each (50 mg).  Water chestnuts,  cup (40 mg). Document Released: 08/29/2004 Document Revised: 01/20/2013 Document Reviewed: 12/12/2012 [facility] Patient Information 2015 [place], [place]. This information is not intended to replace advice given to you by your health care provider. Make sure you discuss any questions you have with your health care provider.

## 2014-09-21 NOTE — ED Notes (Signed)
Pt reports she was started on Lisinopril 2 weeks ago and ever since then she gets a brief moment of palpitations after the dose. Tonight she was woken up from sleep with more intense palpitations and chest pressure. Also reports sob and nv.

## 2014-12-20 ENCOUNTER — Other Ambulatory Visit: Payer: Self-pay | Admitting: Orthopedic Surgery

## 2014-12-21 ENCOUNTER — Other Ambulatory Visit: Payer: Self-pay | Admitting: Emergency Medicine

## 2015-01-18 ENCOUNTER — Encounter (HOSPITAL_COMMUNITY): Payer: Self-pay

## 2015-01-18 ENCOUNTER — Emergency Department (HOSPITAL_COMMUNITY)
Admission: EM | Admit: 2015-01-18 | Discharge: 2015-01-18 | Disposition: A | Discharge status: Home / Self Care | Payer: 59 | Attending: Emergency Medicine | Admitting: Emergency Medicine

## 2015-01-18 ENCOUNTER — Emergency Department (HOSPITAL_COMMUNITY): Payer: 59

## 2015-01-18 DIAGNOSIS — Z791 Long term (current) use of non-steroidal anti-inflammatories (NSAID): Secondary | ICD-10-CM | POA: Diagnosis not present

## 2015-01-18 DIAGNOSIS — K760 Fatty (change of) liver, not elsewhere classified: Secondary | ICD-10-CM | POA: Diagnosis not present

## 2015-01-18 DIAGNOSIS — E119 Type 2 diabetes mellitus without complications: Secondary | ICD-10-CM | POA: Diagnosis not present

## 2015-01-18 DIAGNOSIS — Z79899 Other long term (current) drug therapy: Secondary | ICD-10-CM | POA: Diagnosis not present

## 2015-01-18 DIAGNOSIS — I10 Essential (primary) hypertension: Secondary | ICD-10-CM | POA: Insufficient documentation

## 2015-01-18 DIAGNOSIS — Z7982 Long term (current) use of aspirin: Secondary | ICD-10-CM | POA: Insufficient documentation

## 2015-01-18 DIAGNOSIS — R1010 Upper abdominal pain, unspecified: Secondary | ICD-10-CM | POA: Diagnosis present

## 2015-01-18 DIAGNOSIS — Z8669 Personal history of other diseases of the nervous system and sense organs: Secondary | ICD-10-CM | POA: Diagnosis not present

## 2015-01-18 DIAGNOSIS — R1013 Epigastric pain: Secondary | ICD-10-CM

## 2015-01-18 LAB — COMPREHENSIVE METABOLIC PANEL
ALT: 22 U/L (ref 14–54)
ANION GAP: 7 (ref 5–15)
AST: 21 U/L (ref 15–41)
Albumin: 3.8 g/dL (ref 3.5–5.0)
Alkaline Phosphatase: 81 U/L (ref 38–126)
BILIRUBIN TOTAL: 0.5 mg/dL (ref 0.3–1.2)
BUN: 13 mg/dL (ref 6–20)
CALCIUM: 9.4 mg/dL (ref 8.9–10.3)
CO2: 29 mmol/L (ref 22–32)
CREATININE: 0.88 mg/dL (ref 0.44–1.00)
Chloride: 103 mmol/L (ref 101–111)
GFR calc Af Amer: >60 mL/min (ref >60)
GLUCOSE: 101 mg/dL — AB (ref 65–99)
POTASSIUM: 4.2 mmol/L (ref 3.5–5.1)
Sodium: 139 mmol/L (ref 135–145)
TOTAL PROTEIN: 7.8 g/dL (ref 6.5–8.1)

## 2015-01-18 LAB — URINALYSIS, ROUTINE W REFLEX MICROSCOPIC
BILIRUBIN URINE: NEGATIVE
Glucose, UA: NEGATIVE mg/dL
Hgb urine dipstick: NEGATIVE
KETONES UR: NEGATIVE mg/dL
Leukocytes, UA: NEGATIVE
NITRITE: NEGATIVE
PH: 6.5 (ref 5.0–8.0)
Protein, ur: NEGATIVE mg/dL
Specific Gravity, Urine: 1.014 (ref 1.005–1.030)

## 2015-01-18 LAB — LIPASE, BLOOD: LIPASE: 33 U/L (ref 11–51)

## 2015-01-18 LAB — CBC
HCT: 44.4 % (ref 36.0–46.0)
Hemoglobin: 14.5 g/dL (ref 12.0–15.0)
MCH: 29.5 pg (ref 26.0–34.0)
MCHC: 32.7 g/dL (ref 30.0–36.0)
MCV: 90.2 fL (ref 78.0–100.0)
PLATELETS: 216 10*3/uL (ref 150–400)
RBC: 4.92 MIL/uL (ref 3.87–5.11)
RDW: 13.3 % (ref 11.5–15.5)
WBC: 8.8 10*3/uL (ref 4.0–10.5)

## 2015-01-18 MED ORDER — ONDANSETRON HCL 8 MG PO TABS: 8.0000 mg | ORAL_TABLET | Freq: Three times a day (TID) | ORAL | Status: DC | PRN

## 2015-01-18 MED ORDER — ESOMEPRAZOLE MAGNESIUM 40 MG PO CPDR: 40.0000 mg | DELAYED_RELEASE_CAPSULE | Freq: Every day | ORAL | Status: DC

## 2015-01-18 MED ORDER — SUCRALFATE 1 G PO TABS: 1.0000 g | ORAL_TABLET | Freq: Three times a day (TID) | ORAL | Status: DC

## 2015-01-18 MED ORDER — HYDROCODONE-ACETAMINOPHEN 5-325 MG PO TABS
2.0000 | ORAL_TABLET | Freq: Once | ORAL | Status: AC
  Administered 2015-01-18: 2 via ORAL
  Filled 2015-01-18: qty 2

## 2015-01-18 MED ORDER — DICYCLOMINE HCL 20 MG PO TABS: 20.0000 mg | ORAL_TABLET | Freq: Two times a day (BID) | ORAL | Status: DC

## 2015-01-18 NOTE — ED Notes (Signed)
Pt ambulatory to restroom

## 2015-01-18 NOTE — Discharge Instructions (Signed)
Abdominal Pain, Adult Many things can cause abdominal pain. Usually, abdominal pain is not caused by a disease and will improve without treatment. It can often be observed and treated at home. Your health care provider will do a physical exam and possibly order blood tests and X-rays to help determine the seriousness of your pain. However, in many cases, more time must pass before a clear cause of the pain can be found. Before that point, your health care provider may not know if you need more testing or further treatment. HOME CARE INSTRUCTIONS Monitor your abdominal pain for any changes. The following actions may help to alleviate any discomfort you are experiencing:  Only take over-the-counter or prescription medicines as directed by your health care provider.  Do not take laxatives unless directed to do so by your health care provider.  Try a clear liquid diet (broth, tea, or water) as directed by your health care provider. Slowly move to a bland diet as tolerated. SEEK MEDICAL CARE IF:  You have unexplained abdominal pain.  You have abdominal pain associated with nausea or diarrhea.  You have pain when you urinate or have a bowel movement.  You experience abdominal pain that wakes you in the night.  You have abdominal pain that is worsened or improved by eating food.  You have abdominal pain that is worsened with eating fatty foods.  You have a fever. SEEK IMMEDIATE MEDICAL CARE IF:  Your pain does not go away within 2 hours.  You keep throwing up (vomiting).  Your pain is felt only in portions of the abdomen, such as the right side or the left lower portion of the abdomen.  You pass bloody or black tarry stools. MAKE SURE YOU:  Understand these instructions.  Will watch your condition.  Will get help right away if you are not doing well or get worse.   This information is not intended to replace advice given to you by your health care provider. Make sure you discuss  any questions you have with your health care provider.   Document Released: 10/25/2004 Document Revised: 10/06/2014 Document Reviewed: 09/24/2012 Elsevier Interactive Patient Education 2016 Elsevier Inc[employer]  Fatty Liver Fatty liver, also called hepatic steatosis or steatohepatitis, is a condition in which too much fat has built up in your liver cells. The liver removes harmful substances from your bloodstream. It produces fluids your body needs. It also helps your body use and store energy from the food you eat. In many cases, fatty liver does not cause symptoms or problems. It is often diagnosed when tests are being done for other reasons. However, over time, fatty liver can cause inflammation that may lead to more serious liver problems, such as scarring of the liver (cirrhosis). CAUSES  Causes of fatty liver may include:   Drinking too much alcohol.  Poor nutrition.  Obesity.  Cushing syndrome.  Diabetes.  Hyperlipidemia.  Pregnancy.  Certain drugs.  Poisons.  Some viral infections. RISK FACTORS You may be more likely to develop fatty liver if you:  Abuse alcohol.  Are pregnant.  Are overweight.  Have diabetes.  Have hepatitis.  Have a high triglyceride level.  SIGNS AND SYMPTOMS  Fatty liver often does not cause any symptoms. In cases where symptoms develop, they can include:  Fatigue.  Weakness.  Weight loss.  Confusion.   Abdominal pain.  Yellowing of your skin and the white parts of your eyes (jaundice).  Nausea and vomiting. DIAGNOSIS  Fatty liver may be diagnosed  by:   Physical exam and medical history.  Blood tests.  Imaging tests, such as an ultrasound, CT scan, or MRI.  Liver biopsy. A small sample of liver tissue is removed using a needle. The sample is then looked at under a microscope. TREATMENT  Fatty liver is often caused by other health conditions. Treatment for fatty liver may involve medicines and lifestyle changes to manage  conditions such as:   Alcoholism.  High cholesterol.  Diabetes.  Being overweight or obese.  HOME CARE INSTRUCTIONS  Eat a healthy diet as directed by your health care provider.  Exercise regularly. This can help you lose weight and control your cholesterol and diabetes. Talk to your health care provider about an exercise plan and which activities are best for you.  Do not drink alcohol.   Take medicines only as directed by your health care provider. SEEK MEDICAL CARE IF: You have difficulty controlling your:  Blood sugar.  Cholesterol.  Alcohol consumption. SEEK IMMEDIATE MEDICAL CARE IF:  You have abdominal pain.  You have jaundice.  You have nausea and vomiting.   This information is not intended to replace advice given to you by your health care provider. Make sure you discuss any questions you have with your health care provider.   Document Released: 03/02/2005 Document Revised: 02/05/2014 Document Reviewed: 05/27/2013 Elsevier Interactive Patient Education [employer].

## 2015-01-18 NOTE — ED Notes (Signed)
Patient here with epigastric pain that has been intermittent x 2 months with intermittent vomiting. Pain worse after eating fried foods

## 2015-01-18 NOTE — ED Provider Notes (Signed)
4:01 PM BP 150/80 mmHg  Pulse 66  Temp(Src) 98.7 F (37.1 C) (Oral)  Resp 16  SpO2 100% Patient itaken in sign out. Awaiting RUQ [place]. Anticipate DC with ccs or GI f/u  US negative except for dialated cystic bile duct. Will d/c with both CCS and GI . meds for pain and suspected PUD. Discussed results and return precautions. The patient appears reasonably screened and/or stabilized for discharge and I doubt any other medical condition or other [facility] requiring further screening, evaluation, or treatment in the ED at this time prior to discharge.   Margarita Mail, PA-C 01/20/15 0104  Fredia Sorrow, MD 01/20/15 [phone]

## 2015-01-18 NOTE — ED Provider Notes (Signed)
CSN: [id number]     Arrival date & time 01/18/15  1152 History   First MD Initiated Contact with Patient 01/18/15 1420     Chief Complaint  Patient presents with  . Abdominal Pain     (Consider location/radiation/quality/duration/timing/severity/associated sxs/prior Treatment) HPI Comments: Patient presents to the emergency department with chief complaints of upper abdominal pain, and intermittent nausea and vomiting 1 month. She states that her symptoms are worsened after she eats. She denies any prior abdominal surgeries. She does report a history of acid reflux, but does not take any medication for this. She denies any associated fevers, or chills. There are no other associated symptoms.  The history is provided by the patient. No language interpreter was used.    Past Medical History  Diagnosis Date  . Hypertension   . Diabetes mellitus without complication ([place])   . Neuropathy [facility])    Past Surgical History  Procedure Laterality Date  . Total hip arthroplasty Right   . Abdominal hysterectomy     No family history on file. Social History  Substance Use Topics  . Smoking status: Never Smoker   . Smokeless tobacco: None  . Alcohol Use: No   OB History    No data available     Review of Systems  Constitutional: Negative for fever and chills.  Respiratory: Negative for shortness of breath.   Cardiovascular: Negative for chest pain.  Gastrointestinal: Positive for nausea and abdominal pain. Negative for vomiting, diarrhea and constipation.  Genitourinary: Negative for dysuria.  All other systems reviewed and are negative.     Allergies  Augmentin  Home Medications   Prior to Admission medications   Medication Sig Start Date End Date Taking? Authorizing Provider  aspirin EC 81 MG tablet Take 81 mg by mouth 2 (two) times daily.   Yes Historical Provider, MD  Biotin 5000 MCG TABS Take 5,000 mcg by mouth 2 (two) times daily.   Yes Historical Provider, MD   gabapentin (NEURONTIN) 100 MG capsule Take 200 mg by mouth at bedtime.    Yes Historical Provider, MD  meloxicam (MOBIC) 15 MG tablet Take 15 mg by mouth daily. 01/13/15  Yes Historical Provider, MD  metFORMIN (GLUCOPHAGE-XR) 500 MG 24 hr tablet Take 1,000 mg by mouth at bedtime.   Yes Historical Provider, MD  niacin 500 MG tablet Take 500 mg by mouth 2 (two) times daily.   Yes Historical Provider, MD  Nutritional Supplements (MENOPAUSE FORMULA PO) Take 1 tablet by mouth daily.   Yes Historical Provider, MD  triamterene-hydrochlorothiazide (MAXZIDE) 75-50 MG per tablet Take 1 tablet by mouth daily.   Yes Historical Provider, MD  ondansetron (ZOFRAN) 8 MG tablet Take 1 tablet (8 mg total) by mouth every 8 (eight) hours as needed for nausea or vomiting. Patient not taking: Reported on 01/18/2015 09/21/14   Mercedes Camprubi-Soms, PA-C   BP 138/84 mmHg  Pulse 69  Temp(Src) 98.7 F (37.1 C) (Oral)  Resp 18  SpO2 100% Physical Exam  Constitutional: She is oriented to person, place, and time. She appears well-developed and well-nourished.  HENT:  Head: Normocephalic and atraumatic.  Eyes: Conjunctivae and EOM are normal. Pupils are equal, round, and reactive to light.  Neck: Normal range of motion. Neck supple.  Cardiovascular: Normal rate and regular rhythm.  Exam reveals no gallop and no friction rub.   No murmur heard. Pulmonary/Chest: Effort normal and breath sounds normal. No respiratory distress. She has no wheezes. She has no rales. She exhibits no  tenderness.  Abdominal: Soft. Bowel sounds are normal. She exhibits no distension and no mass. There is no tenderness. There is no rebound and no guarding.  No focal abdominal tenderness, no RLQ tenderness or pain at McBurney's point, no RUQ tenderness or Murphy's sign, no left-sided abdominal tenderness, no fluid wave, or signs of peritonitis   Musculoskeletal: Normal range of motion. She exhibits no edema or tenderness.  Neurological: She  is alert and oriented to person, place, and time.  Skin: Skin is warm and dry.  Psychiatric: She has a normal mood and affect. Her behavior is normal. Judgment and thought content normal.  Nursing note and vitals reviewed.   ED Course  Procedures (including critical care time) Results for orders placed or performed during the hospital encounter of 01/18/15  Lipase, blood  Result Value Ref Range   Lipase 33 11 - 51 U/L  Comprehensive metabolic panel  Result Value Ref Range   Sodium 139 135 - 145 mmol/L   Potassium 4.2 3.5 - 5.1 mmol/L   Chloride 103 101 - 111 mmol/L   CO2 29 22 - 32 mmol/L   Glucose, Bld 101 (H) 65 - 99 mg/dL   BUN 13 6 - 20 mg/dL   Creatinine, Ser 0.88 0.44 - 1.00 mg/dL   Calcium 9.4 8.9 - 10.3 mg/dL   Total Protein 7.8 6.5 - 8.1 g/dL   Albumin 3.8 3.5 - 5.0 g/dL   AST 21 15 - 41 U/L   ALT 22 14 - 54 U/L   Alkaline Phosphatase 81 38 - 126 U/L   Total Bilirubin 0.5 0.3 - 1.2 mg/dL   GFR calc non Af Amer >60 >60 mL/min   GFR calc Af Amer >60 >60 mL/min   Anion gap 7 5 - 15  CBC  Result Value Ref Range   WBC 8.8 4.0 - 10.5 K/uL   RBC 4.92 3.87 - 5.11 MIL/uL   Hemoglobin 14.5 12.0 - 15.0 g/dL   HCT 44.4 36.0 - 46.0 %   MCV 90.2 78.0 - 100.0 fL   MCH 29.5 26.0 - 34.0 pg   MCHC 32.7 30.0 - 36.0 g/dL   RDW 13.3 11.5 - 15.5 %   Platelets 216 150 - 400 K/uL  Urinalysis, Routine w reflex microscopic (not at [facility])  Result Value Ref Range   Color, Urine YELLOW YELLOW   APPearance CLEAR CLEAR   Specific Gravity, Urine 1.014 1.005 - 1.030   pH 6.5 5.0 - 8.0   Glucose, UA NEGATIVE NEGATIVE mg/dL   Hgb urine dipstick NEGATIVE NEGATIVE   Bilirubin Urine NEGATIVE NEGATIVE   Ketones, ur NEGATIVE NEGATIVE mg/dL   Protein, ur NEGATIVE NEGATIVE mg/dL   Nitrite NEGATIVE NEGATIVE   Leukocytes, UA NEGATIVE NEGATIVE   US Abdomen Limited  01/18/2015  CLINICAL DATA:  Patient with right upper quadrant pain for 1 day. Prior hysterectomy. EXAM: US ABDOMEN LIMITED - RIGHT  UPPER QUADRANT COMPARISON:  None. FINDINGS: Gallbladder: No gallstones or wall thickening visualized. No sonographic Murphy sign noted by sonographer. Common bile duct: Diameter: 7 mm Liver: Diffusely increased in echogenicity.  No focal lesion identified. IMPRESSION: No cholelithiasis or sonographic evidence for acute cholecystitis. Common bile duct is dilated measuring 7 mm. Recommend correlation with LFTs. If abnormal, consider further evaluation with MRCP. Hepatic steatosis. Electronically Signed   By: Lovey Newcomer M.D.   On: 01/18/2015 16:08    I have personally reviewed and evaluated these images and lab results as part of my medical decision-making.  MDM   Final diagnoses:  Epigastric abdominal pain  Fatty liver    Patient with intermittent upper abdominal pain 1 month. She has had some associated nausea and vomiting. She has a history of GERD, but this is untreated. Symptoms could be related to GERD versus gallbladder colic. Will check right upper quadrant ultrasound.  Patient signed out to [place], [place], who will follow-up on ultrasound, and review results with patient.    Montine Circle, PA-C 01/19/15 1643  Fredia Sorrow, MD 01/20/15 [phone]

## 2015-07-11 ENCOUNTER — Encounter (HOSPITAL_COMMUNITY): Payer: Self-pay | Admitting: *Deleted

## 2015-07-11 ENCOUNTER — Emergency Department (HOSPITAL_COMMUNITY)
Admission: EM | Admit: 2015-07-11 | Discharge: 2015-07-11 | Disposition: A | Discharge status: Home / Self Care | Payer: 59 | Attending: Emergency Medicine | Admitting: Emergency Medicine

## 2015-07-11 DIAGNOSIS — I10 Essential (primary) hypertension: Secondary | ICD-10-CM | POA: Insufficient documentation

## 2015-07-11 DIAGNOSIS — Z79899 Other long term (current) drug therapy: Secondary | ICD-10-CM | POA: Insufficient documentation

## 2015-07-11 DIAGNOSIS — Z7982 Long term (current) use of aspirin: Secondary | ICD-10-CM | POA: Diagnosis not present

## 2015-07-11 DIAGNOSIS — Z7984 Long term (current) use of oral hypoglycemic drugs: Secondary | ICD-10-CM | POA: Diagnosis not present

## 2015-07-11 DIAGNOSIS — K922 Gastrointestinal hemorrhage, unspecified: Secondary | ICD-10-CM | POA: Diagnosis not present

## 2015-07-11 DIAGNOSIS — E119 Type 2 diabetes mellitus without complications: Secondary | ICD-10-CM | POA: Insufficient documentation

## 2015-07-11 DIAGNOSIS — K625 Hemorrhage of anus and rectum: Secondary | ICD-10-CM | POA: Diagnosis present

## 2015-07-11 LAB — COMPREHENSIVE METABOLIC PANEL
ALT: 41 U/L (ref 14–54)
AST: 34 U/L (ref 15–41)
Albumin: 3.8 g/dL (ref 3.5–5.0)
Alkaline Phosphatase: 79 U/L (ref 38–126)
Anion gap: 11 (ref 5–15)
BILIRUBIN TOTAL: 0.5 mg/dL (ref 0.3–1.2)
BUN: 11 mg/dL (ref 6–20)
CALCIUM: 9.1 mg/dL (ref 8.9–10.3)
CHLORIDE: 99 mmol/L — AB (ref 101–111)
CO2: 28 mmol/L (ref 22–32)
CREATININE: 0.92 mg/dL (ref 0.44–1.00)
Glucose, Bld: 112 mg/dL — ABNORMAL HIGH (ref 65–99)
POTASSIUM: 3.3 mmol/L — AB (ref 3.5–5.1)
Sodium: 138 mmol/L (ref 135–145)
TOTAL PROTEIN: 7.9 g/dL (ref 6.5–8.1)

## 2015-07-11 LAB — CBC
HCT: 41.5 % (ref 36.0–46.0)
HEMOGLOBIN: 13.6 g/dL (ref 12.0–15.0)
MCH: 28.3 pg (ref 26.0–34.0)
MCHC: 32.8 g/dL (ref 30.0–36.0)
MCV: 86.3 fL (ref 78.0–100.0)
PLATELETS: 241 10*3/uL (ref 150–400)
RBC: 4.81 MIL/uL (ref 3.87–5.11)
RDW: 13 % (ref 11.5–15.5)
WBC: 6 10*3/uL (ref 4.0–10.5)

## 2015-07-11 LAB — POC OCCULT BLOOD, ED: FECAL OCCULT BLD: POSITIVE — AB

## 2015-07-11 LAB — TYPE AND SCREEN
ABO/RH(D): O POS
ANTIBODY SCREEN: NEGATIVE

## 2015-07-11 LAB — CBG MONITORING, ED: GLUCOSE-CAPILLARY: 109 mg/dL — AB (ref 65–99)

## 2015-07-11 NOTE — ED Provider Notes (Signed)
CSN: [id number]     Arrival date & time 07/11/15  0919 History   First MD Initiated Contact with Patient 07/11/15 1031     Chief Complaint  Patient presents with  . Rectal Bleeding     (Consider location/radiation/quality/duration/timing/severity/associated sxs/prior Treatment) HPI Patient reports rectal bleeding for about 2 weeks. Initially symptoms started with a large loose bowel movement that was nonbloody. She went on then to have small amounts of blood sporadically. She reports for the past couple of days now her bowel movements have been red and mostly blood. She reports 3 episodes of stool this morning that looks like "red mush" in the bottom of the toilet. She denies any pain. She has not been having abdominal pain. No rectal pain. She denies weakness or lightheadedness. No syncopal episode. No recent fevers or chills. Patient states that she had a colonoscopy some 11 years ago. At that time to polyps were removed. She does not know of any history of diverticulosis. Patient does however report a strong family history of GI cancer. Past Medical History  Diagnosis Date  . Hypertension   . Diabetes mellitus without complication ([place])   . Neuropathy [facility])    Past Surgical History  Procedure Laterality Date  . Total hip arthroplasty Right   . Abdominal hysterectomy     No family history on file. Social History  Substance Use Topics  . Smoking status: Never Smoker   . Smokeless tobacco: None  . Alcohol Use: No   OB History    No data available     Review of Systems  10 Systems reviewed and are negative for acute change except as noted in the HPI.   Allergies  Lisinopril and Augmentin  Home Medications   Prior to Admission medications   Medication Sig Start Date End Date Taking? Authorizing Provider  aspirin EC 81 MG tablet Take 81 mg by mouth 2 (two) times daily.   Yes Historical Provider, MD  bismuth subsalicylate (PEPTO BISMOL) 262 MG/15ML suspension Take 30 mLs by  mouth every 6 (six) hours as needed for indigestion or diarrhea or loose stools.   Yes Historical Provider, MD  Black Cohosh-SoyIsoflav-Magnol (ESTROVEN MENOPAUSE RELIEF) CAPS Take 1 tablet by mouth daily.   Yes Historical Provider, MD  Esomeprazole Magnesium (NEXIUM 24HR) 20 MG TBEC Take 20 mg by mouth at bedtime.   Yes Historical Provider, MD  gabapentin (NEURONTIN) 100 MG capsule Take 200 mg by mouth at bedtime.    Yes Historical Provider, MD  loperamide (IMODIUM) 1 MG/5ML solution Take 3 mg by mouth as needed for diarrhea or loose stools.   Yes Historical Provider, MD  metFORMIN (GLUCOPHAGE-XR) 500 MG 24 hr tablet Take 1,000 mg by mouth at bedtime.   Yes Historical Provider, MD  metoprolol (LOPRESSOR) 50 MG tablet Take 50 mg by mouth 2 (two) times daily.   Yes Historical Provider, MD  niacin 250 MG tablet Take 500 mg by mouth at bedtime.   Yes Historical Provider, MD  Plant Sterols and Stanols (CHOLESTOFF PLUS PO) Take 1 tablet by mouth daily.   Yes Historical Provider, MD  Specialty Vitamins Products (VITAMINS FOR THE HAIR) TABS Take 2 tablets by mouth 2 (two) times daily.   Yes Historical Provider, MD  triamterene-hydrochlorothiazide (MAXZIDE) 75-50 MG per tablet Take 0.5 tablets by mouth daily.    Yes Historical Provider, MD  Turmeric Curcumin 500 MG CAPS Take 2,000 mg by mouth daily.   Yes Historical Provider, MD  dicyclomine (BENTYL) 20 MG  tablet Take 1 tablet (20 mg total) by mouth 2 (two) times daily. Patient not taking: Reported on 07/11/2015 01/18/15   Margarita Mail, PA-C  esomeprazole (NEXIUM) 40 MG capsule Take 1 capsule (40 mg total) by mouth daily. Patient not taking: Reported on 07/11/2015 01/18/15   Margarita Mail, PA-C  ondansetron (ZOFRAN) 8 MG tablet Take 1 tablet (8 mg total) by mouth every 8 (eight) hours as needed for nausea or vomiting. Patient not taking: Reported on 07/11/2015 01/18/15   Margarita Mail, PA-C  sucralfate (CARAFATE) 1 G tablet Take 1 tablet (1 g total) by  mouth 4 (four) times daily -  with meals and at bedtime. Patient not taking: Reported on 07/11/2015 01/18/15   Margarita Mail, PA-C   BP 135/59 mmHg  Pulse 68  Temp(Src) 98.4 F (36.9 C) (Oral)  Resp 18  SpO2 100% Physical Exam  Constitutional: She is oriented to person, place, and time.  Patient is alert and nontoxic. No respiratory distress. Color is good.  HENT:  Head: Normocephalic and atraumatic.  Mouth/Throat: Oropharynx is clear and moist.  Eyes: EOM are normal. Pupils are equal, round, and reactive to light.  Neck: Neck supple.  Cardiovascular: Normal rate, regular rhythm, normal heart sounds and intact distal pulses.   Pulmonary/Chest: Effort normal and breath sounds normal.  Abdominal: Soft. Bowel sounds are normal. She exhibits no distension. There is no tenderness.  Genitourinary:  Rectal vault is empty. Trace blood-tinged mucus present. No obvious, significant hemorrhoid. No masses in the rectal vault.  Musculoskeletal: Normal range of motion. She exhibits no edema or tenderness.  Neurological: She is alert and oriented to person, place, and time. She has normal strength. Coordination normal. GCS eye subscore is 4. GCS verbal subscore is 5. GCS motor subscore is 6.  Skin: Skin is warm, dry and intact.  Psychiatric: She has a normal mood and affect.    ED Course  Procedures (including critical care time) Labs Review Labs Reviewed  COMPREHENSIVE METABOLIC PANEL - Abnormal; Notable for the following:    Potassium 3.3 (*)    Chloride 99 (*)    Glucose, Bld 112 (*)    All other components within normal limits  POC OCCULT BLOOD, ED - Abnormal; Notable for the following:    Fecal Occult Bld POSITIVE (*)    All other components within normal limits  CBG MONITORING, ED - Abnormal; Notable for the following:    Glucose-Capillary 109 (*)    All other components within normal limits  CBC  TYPE AND SCREEN    Imaging Review No results found. I have personally reviewed  and evaluated these images and lab results as part of my medical decision-making.   EKG Interpretation None     Consult: Discussed with Dr. Oletta Lamas of [facility]. Patient will be contacted for an appointment. MDM   Final diagnoses:  Lower GI bleed   Patient presents with recurrent bloody stool. General condition is good. She is alert and nontoxic. Vital signs are stable. Hemoglobin is stable. There is blood-tinged mucus in the rectal vault. This appears to be lower GI bleed. Patient has not been absorbing blood to elevate her BUN. Consideration is also for bloody diarrhea. The patient however has no fever and no abdominal pain. At this time I do not feel that antibiotics are indicated. Plan will be for follow-up with gastroenterology. Signs and symptoms which return are reviewed.    Charlesetta Shanks, MD 07/11/15 [phone]

## 2015-07-11 NOTE — Discharge Instructions (Signed)
Gastrointestinal Bleeding Gastrointestinal (GI) bleeding means there is bleeding somewhere along the digestive tract, between the mouth and anus. CAUSES  There are many different problems that can cause GI bleeding. Possible causes include:  Esophagitis. This is inflammation, irritation, or swelling of the esophagus.  Hemorrhoids.These are veins that are full of blood (engorged) in the rectum. They cause pain, inflammation, and may bleed.  Anal fissures.These are areas of painful tearing which may bleed. They are often caused by passing hard stool.  Diverticulosis.These are pouches that form on the colon over time, with age, and may bleed significantly.  Diverticulitis.This is inflammation in areas with diverticulosis. It can cause pain, fever, and bloody stools, although bleeding is rare.  Polyps and cancer. Colon cancer often starts out as precancerous polyps.  Gastritis and ulcers.Bleeding from the upper gastrointestinal tract (near the stomach) may travel through the intestines and produce black, sometimes tarry, often bad smelling stools. In certain cases, if the bleeding is fast enough, the stools may not be black, but red. This condition may be life-threatening. SYMPTOMS   Vomiting bright red blood or material that looks like coffee grounds.  Bloody, black, or tarry stools. DIAGNOSIS  Your caregiver may diagnose your condition by taking your history and performing a physical exam. More tests may be needed, including:  X-rays and other imaging tests.  Esophagogastroduodenoscopy (EGD). This test uses a flexible, lighted tube to look at your esophagus, stomach, and small intestine.  Colonoscopy. This test uses a flexible, lighted tube to look at your colon. TREATMENT  Treatment depends on the cause of your bleeding.   For bleeding from the esophagus, stomach, small intestine, or colon, the caregiver doing your EGD or colonoscopy may be able to stop the bleeding as part of  the procedure.  Inflammation or infection of the colon can be treated with medicines.  Many rectal problems can be treated with creams, suppositories, or warm baths.  Surgery is sometimes needed.  Blood transfusions are sometimes needed if you have lost a lot of blood. If bleeding is slow, you may be allowed to go home. If there is a lot of bleeding, you will need to stay in the hospital for observation. HOME CARE INSTRUCTIONS   Take any medicines exactly as prescribed.  Keep your stools soft by eating foods that are high in fiber. These foods include whole grains, legumes, fruits, and vegetables. Prunes (1 to 3 a day) work well for many people.  Drink enough fluids to keep your urine clear or pale yellow. SEEK IMMEDIATE MEDICAL CARE IF:   Your bleeding increases.  You feel lightheaded, weak, or you faint.  You have severe cramps in your back or abdomen.  You pass large blood clots in your stool.  Your problems are getting worse. MAKE SURE YOU:   Understand these instructions.  Will watch your condition.  Will get help right away if you are not doing well or get worse.   This information is not intended to replace advice given to you by your health care provider. Make sure you discuss any questions you have with your health care provider.   Document Released: 01/13/2000 Document Revised: 01/02/2012 Document Reviewed: 07/05/2014 Elsevier Interactive Patient Education 2016 Else[employer] Bloody Diarrhea Bloody diarrhea can be caused by many different conditions. Most of the time bloody diarrhea is the result of food poisoning or minor infections. Bloody diarrhea usually improves over 2 to 3 days of rest and fluid replacement. Other conditions that can cause bloody diarrhea  include:  Internal bleeding.  Infection.  Diseases of the bowel and colon. Internal bleeding from an ulcer or bowel disease can be severe and requires hospital care or even surgery. DIAGNOSIS  To  find out what is wrong your caregiver may check your:  Stool.  Blood.  Results from a test that looks inside the body (endoscopy). TREATMENT   Get plenty of rest.  Drink enough water and fluids to keep your urine clear or pale yellow.  Do not smoke.  Solid foods and dairy products should be avoided until your illness improves.  As you improve, slowly return to a regular diet with easily-digested foods first. Examples are:  Bananas.  Rice.  Toast.  Crackers. You should only need these for about 2 days before adding more normal foods to your diet.  Avoid spicy or fatty foods as well as caffeine and alcohol for several days.  Medicine to control cramping and diarrhea can relieve symptoms but may prolong some cases of bloody diarrhea. Antibiotics can speed recovery from diarrhea due to some bacterial infections. Call your caregiver if diarrhea does not get better in 3 days. SEEK MEDICAL CARE IF:   You do not improve after 3 days.  Your diarrhea improves but your stool appears black. SEEK IMMEDIATE MEDICAL CARE IF:   You become extremely weak or faint.  You become very sweaty.  You have increased pain or bleeding.  You develop repeated vomiting.  You vomit and you see blood or the vomit looks black in color.  You have a fever.   This information is not intended to replace advice given to you by your health care provider. Make sure you discuss any questions you have with your health care provider.   Document Released: 01/15/2005 Document Revised: 02/05/2014 Document Reviewed: 12/17/2008 Elsevier Interactive Patient Education [employer].

## 2015-07-11 NOTE — ED Notes (Signed)
Pt reports scant bright red rectal bleeding x 2 weeks ago, but Friday it's more bleeding.  Reports feeling bloated but denies any abd pain or n/v.  States each time she has a BM, it "squirts and it's just blood."  Denies known hemorrhoids at this time.

## 2015-07-13 ENCOUNTER — Telehealth: Payer: Self-pay | Admitting: Gastroenterology

## 2015-07-13 NOTE — Telephone Encounter (Signed)
Patient will come in tomorrow and see Alonza Bogus, PA at 11:00

## 2015-07-14 ENCOUNTER — Other Ambulatory Visit (INDEPENDENT_AMBULATORY_CARE_PROVIDER_SITE_OTHER): Payer: 59

## 2015-07-14 ENCOUNTER — Encounter: Payer: Self-pay | Admitting: Gastroenterology

## 2015-07-14 ENCOUNTER — Ambulatory Visit (INDEPENDENT_AMBULATORY_CARE_PROVIDER_SITE_OTHER): Payer: 59 | Admitting: Gastroenterology

## 2015-07-14 VITALS — BP 118/80 | HR 80 | Ht 64.0 in | Wt 236.2 lb

## 2015-07-14 DIAGNOSIS — Z800 Family history of malignant neoplasm of digestive organs: Secondary | ICD-10-CM | POA: Insufficient documentation

## 2015-07-14 DIAGNOSIS — Z1211 Encounter for screening for malignant neoplasm of colon: Secondary | ICD-10-CM

## 2015-07-14 DIAGNOSIS — R634 Abnormal weight loss: Secondary | ICD-10-CM | POA: Insufficient documentation

## 2015-07-14 DIAGNOSIS — K625 Hemorrhage of anus and rectum: Secondary | ICD-10-CM | POA: Insufficient documentation

## 2015-07-14 DIAGNOSIS — R1032 Left lower quadrant pain: Secondary | ICD-10-CM

## 2015-07-14 LAB — CBC WITH DIFFERENTIAL/PLATELET
BASOS ABS: 0 10*3/uL (ref 0.0–0.1)
Basophils Relative: 0.3 % (ref 0.0–3.0)
EOS ABS: 0.5 10*3/uL (ref 0.0–0.7)
Eosinophils Relative: 3.7 % (ref 0.0–5.0)
HEMATOCRIT: 41.2 % (ref 36.0–46.0)
Hemoglobin: 13.6 g/dL (ref 12.0–15.0)
LYMPHS PCT: 25.6 % (ref 12.0–46.0)
Lymphs Abs: 3.4 10*3/uL (ref 0.7–4.0)
MCHC: 33 g/dL (ref 30.0–36.0)
MCV: 85.8 fl (ref 78.0–100.0)
Monocytes Absolute: 1.4 10*3/uL — ABNORMAL HIGH (ref 0.1–1.0)
Monocytes Relative: 10.5 % (ref 3.0–12.0)
NEUTROS ABS: 7.9 10*3/uL — AB (ref 1.4–7.7)
Neutrophils Relative %: 59.9 % (ref 43.0–77.0)
PLATELETS: 290 10*3/uL (ref 150.0–400.0)
RBC: 4.8 Mil/uL (ref 3.87–5.11)
RDW: 13 % (ref 11.5–15.5)
WBC: 13.1 10*3/uL — AB (ref 4.0–10.5)

## 2015-07-14 LAB — COMPREHENSIVE METABOLIC PANEL
ALBUMIN: 4 g/dL (ref 3.5–5.2)
ALK PHOS: 92 U/L (ref 39–117)
ALT: 40 U/L — ABNORMAL HIGH (ref 0–35)
AST: 27 U/L (ref 0–37)
BUN: 9 mg/dL (ref 6–23)
CALCIUM: 9.5 mg/dL (ref 8.4–10.5)
CO2: 34 mEq/L — ABNORMAL HIGH (ref 19–32)
Chloride: 95 mEq/L — ABNORMAL LOW (ref 96–112)
Creatinine, Ser: 0.89 mg/dL (ref 0.40–1.20)
GFR: 69.56 mL/min (ref >60.00)
Glucose, Bld: 109 mg/dL — ABNORMAL HIGH (ref 70–99)
POTASSIUM: 3 meq/L — AB (ref 3.5–5.1)
SODIUM: 136 meq/L (ref 135–145)
TOTAL PROTEIN: 8.1 g/dL (ref 6.0–8.3)
Total Bilirubin: 0.5 mg/dL (ref 0.2–1.2)

## 2015-07-14 MED ORDER — NA SULFATE-K SULFATE-MG SULF 17.5-3.13-1.6 GM/177ML PO SOLN: 1.0000 | Freq: Once | ORAL | Status: DC

## 2015-07-14 MED ORDER — NA SULFATE-K SULFATE-MG SULF 17.5-3.13-1.6 GM/177ML PO SOLN: 1.0000 | Freq: Once | ORAL | Status: AC

## 2015-07-14 MED ORDER — HYDROCORTISONE ACETATE 25 MG RE SUPP: 25.0000 mg | Freq: Two times a day (BID) | RECTAL | Status: DC

## 2015-07-14 NOTE — Patient Instructions (Addendum)
If you are age 57 or older, your body mass index should be between 23-30. Your Body mass index is 40.53 kg/(m^2). If this is out of the aforementioned range listed, please consider follow up with your Primary Care Provider.  If you are age 69 or younger, your body mass index should be between 19-25. Your Body mass index is 40.53 kg/(m^2). If this is out of the aformentioned range listed, please consider follow up with your Primary Care Provider.   Your physician has requested that you go to the basement for the following lab work before leaving today: CBC, CMET  We have sent the following medications to your pharmacy for you to pick up at your convenience: Hydrocortisone   You have been scheduled for a colonoscopy. Please follow written instructions given to you at your visit today.  Please pick up your prep supplies at the pharmacy within the next 1-3 days. If you use inhalers (even only as needed), please bring them with you on the day of your procedure. Your physician has requested that you go to www.startemmi.com and enter the access code given to you at your visit today. This web site gives a general overview about your procedure. However, you should still follow specific instructions given to you by our office regarding your preparation for the procedure.  Your physician has requested that you go to the basement for the following lab work before leaving today: CBC   You have been scheduled for a CT scan of the abdomen and pelvis at [facility] ([address]---this is in the same building as [facility]).   You are scheduled on Friday 07-15-2015 at 1:00 PM. You should arrive at 12:45 PM  to your appointment time for registration. Please follow the written instructions below on the day of your exam:  WARNING: IF YOU ARE ALLERGIC TO IODINE/X-RAY DYE, PLEASE NOTIFY RADIOLOGY IMMEDIATELY AT [phone]! YOU WILL BE GIVEN A 13 HOUR PREMEDICATION PREP.  1) Do not eat or  drink anything after 9:00 am (4 hours prior to your test) 2) You have been given 2 bottles of oral contrast to drink. The solution may taste   better if refrigerated, but do NOT add ice or any other liquid to this solution. Shake   well before drinking.    Drink 1 bottle of contrast @ 11:00 am (2 hours prior to your exam)  Drink 1 bottle of contrast @ 12:00 Noon (1 hour prior to your exam)  You may take any medications as prescribed with a small amount of water except for the following: Metformin, Glucophage, Glucovance, Avandamet, Riomet, Fortamet, Actoplus Met, Janumet, Glumetza or Metaglip. The above medications must be held the day of the exam AND 48 hours after the exam.  The purpose of you drinking the oral contrast is to aid in the visualization of your intestinal tract. The contrast solution may cause some diarrhea. Before your exam is started, you will be given a small amount of fluid to drink. Depending on your individual set of symptoms, you may also receive an intravenous injection of x-ray contrast/dye. Plan on being at [facility] for 30 minutes or long, depending on the type of exam you are having performed.  If you have any questions regarding your exam or if you need to reschedule, you may call the CT department at [phone] between the hours of 8:00 am and 5:00 pm, Monday-Friday.  ________________________________________________________________________

## 2015-07-14 NOTE — Progress Notes (Signed)
07/14/2015 DYAMON SOSINSKI [id number] 1960/07/10   HISTORY OF PRESENT ILLNESS:  This is a pleasant 55 year old female who is known to Dr. Fuller Plan several years ago for colonoscopy. We actually don't even have those records at this time and had to request her paper chart. Anyway, she presents to our office today with complaints of rectal bleeding and left lower quadrant abdominal pain. She states that last Saturday, so 5 days ago, she started having rectal bleeding. It has been bright red, but is continuous with bowel movements and she says that a lot of times it is mostly blood and very little stool. She presented to the ER on the 12th for evaluation at which time she was heme positive, but HGb was normal/stable and remaining labs were ok so she was discharged home and told to follow-up with GI.  She tells me that then on Tuesday, so 2 days ago, she developed some left lower quadrant abdominal pain that has been persistent for the past couple of days.  She says that she feels like there is a rock and pressure in her LLQ when she stands up.  She says that she has lost about 13 pounds since this all began because she is afraid to eat and irritate something.  Of note, she also has a family history of colon cancer in her father listed in her history.   Past Medical History  Diagnosis Date  . Hypertension   . Diabetes mellitus without complication ([place])   . Neuropathy [facility])    Past Surgical History  Procedure Laterality Date  . Total hip arthroplasty Right   . Abdominal hysterectomy      reports that she has never smoked. She has never used smokeless tobacco. She reports that she does not drink alcohol or use illicit drugs. family history includes Colon cancer in her father; Stomach cancer in her paternal uncle and paternal uncle. Allergies  Allergen Reactions  . Lisinopril Palpitations  . Augmentin [Amoxicillin-Pot Clavulanate] Other (See Comments)    sts gives her a yeast infection       Outpatient Encounter Prescriptions as of 07/14/2015  Medication Sig  . aspirin EC 81 MG tablet Take 81 mg by mouth 2 (two) times daily.  [name] Kitchen bismuth subsalicylate (PEPTO BISMOL) 262 MG/15ML suspension Take 30 mLs by mouth every 6 (six) hours as needed for indigestion or diarrhea or loose stools.  . Black Cohosh-SoyIsoflav-Magnol (ESTROVEN MENOPAUSE RELIEF) CAPS Take 1 tablet by mouth daily.  [name] Kitchen dicyclomine (BENTYL) 20 MG tablet Take 1 tablet (20 mg total) by mouth 2 (two) times daily.  [name] Kitchen esomeprazole (NEXIUM) 40 MG capsule Take 1 capsule (40 mg total) by mouth daily.  . Esomeprazole Magnesium (NEXIUM 24HR) 20 MG TBEC Take 20 mg by mouth at bedtime.  . gabapentin (NEURONTIN) 100 MG capsule Take 200 mg by mouth at bedtime.   [name] Kitchen loperamide (IMODIUM) 1 MG/5ML solution Take 3 mg by mouth as needed for diarrhea or loose stools.  . metFORMIN (GLUCOPHAGE-XR) 500 MG 24 hr tablet Take 1,000 mg by mouth at bedtime.  . metoprolol (LOPRESSOR) 50 MG tablet Take 50 mg by mouth 2 (two) times daily.  . niacin 250 MG tablet Take 500 mg by mouth at bedtime.  . ondansetron (ZOFRAN) 8 MG tablet Take 1 tablet (8 mg total) by mouth every 8 (eight) hours as needed for nausea or vomiting.  . Plant Sterols and Stanols (CHOLESTOFF PLUS PO) Take 1 tablet by mouth daily.  [name] Kitchen Specialty Vitamins  Products (VITAMINS FOR THE HAIR) TABS Take 2 tablets by mouth 2 (two) times daily.  . sucralfate (CARAFATE) 1 G tablet Take 1 tablet (1 g total) by mouth 4 (four) times daily -  with meals and at bedtime.  . triamterene-hydrochlorothiazide (MAXZIDE) 75-50 MG per tablet Take 0.5 tablets by mouth daily.   . Turmeric Curcumin 500 MG CAPS Take 2,000 mg by mouth daily.  . hydrocortisone (ANUSOL-HC) 25 MG suppository Place 1 suppository (25 mg total) rectally 2 (two) times daily.  . Na Sulfate-K Sulfate-Mg Sulf 17.5-3.13-1.6 GM/180ML SOLN Take 1 kit by mouth once.  . Na Sulfate-K Sulfate-Mg Sulf 17.5-3.13-1.6 GM/180ML SOLN Take 1 kit by  mouth once.   No facility-administered encounter medications on file as of 07/14/2015.     REVIEW OF SYSTEMS  : All other systems reviewed and negative except where noted in the History of Present Illness.   PHYSICAL EXAM: BP 118/80 mmHg  Pulse 80  Ht 5' 4"  (1.626 m)  Wt 236 lb 4 oz (107.162 kg)  BMI 40.53 kg/m2 General: Well developed white female in no acute distress Head: Normocephalic and atraumatic Eyes:  Sclerae anicteric, conjunctiva pink. Ears: Normal auditory acuity Lungs: Clear throughout to auscultation Heart: Regular rate and rhythm Abdomen: Soft, non-distended.  Normal bowel sounds.  Moderate LLQ TTP. Rectal:  No external hemorrhoids noted.  DRE did not reveal any masses.  No stool but trace pink mucus/secretions on exam glove that were heme positive.  Anoscopy revealed internal hemorrhoids and some blood but no exact bleeding source identified.   Musculoskeletal: Symmetrical with no gross deformities  Skin: No lesions on visible extremities Extremities: No edema  Neurological: Alert oriented x 4, grossly non-focal Psychological:  Alert and cooperative. Normal mood and affect  ASSESSMENT AND PLAN: -55 year old female with complaints of left lower quadrant abdominal pain and rectal bleeding. She does need a colonoscopy as she has not had one in several years and did have some bleeding on exam today. We will schedule that with Dr. Fuller Plan.  The risks, benefits, and alternatives to colonoscopy were discussed with the patient and she consents to proceed.  I'm going to recheck a CBC and a BMP today.  I'm going to treat her empirically with some hydrocortisone suppositories twice a day for now. In light of her left lower quadrant abdominal pain, she is quite tender on exam. I am going to obtain a CT scan tomorrow to rule out diverticulitis, etc. before proceeding with colonoscopy.  -Weight loss of 13 pounds over the past week -Family history of colon caner in her father.   CC:   Orpah Melter, MD

## 2015-07-15 ENCOUNTER — Ambulatory Visit (INDEPENDENT_AMBULATORY_CARE_PROVIDER_SITE_OTHER)
Admission: RE | Admit: 2015-07-15 | Discharge: 2015-07-15 | Disposition: A | Discharge status: Home / Self Care | Payer: 59 | Source: Ambulatory Visit | Attending: Gastroenterology | Admitting: Gastroenterology

## 2015-07-15 ENCOUNTER — Telehealth: Payer: Self-pay | Admitting: Gastroenterology

## 2015-07-15 DIAGNOSIS — K625 Hemorrhage of anus and rectum: Secondary | ICD-10-CM

## 2015-07-15 DIAGNOSIS — R1032 Left lower quadrant pain: Secondary | ICD-10-CM

## 2015-07-15 MED ORDER — METRONIDAZOLE 500 MG PO TABS: 500.0000 mg | ORAL_TABLET | Freq: Three times a day (TID) | ORAL | Status: DC

## 2015-07-15 MED ORDER — IOPAMIDOL (ISOVUE-300) INJECTION 61%
100.0000 mL | Freq: Once | INTRAVENOUS | Status: AC | PRN
  Administered 2015-07-15: 100 mL via INTRAVENOUS

## 2015-07-15 MED ORDER — CIPROFLOXACIN HCL 500 MG PO TABS: 500.0000 mg | ORAL_TABLET | Freq: Two times a day (BID) | ORAL | Status: DC

## 2015-07-15 NOTE — Telephone Encounter (Signed)
Spoke with the patient regarding her CT scan results. She has diverticulitis. We'll send Cipro and Flagyl to her pharmacy for 14 day course. She is already scheduled for colonoscopy on July 24, which we will proceed with. She will call back post-treatment if her symptoms persist and will certainly call our office back or proceed to the ER if symptoms worsen.

## 2015-07-17 NOTE — Progress Notes (Signed)
Reviewed and agree with initial management plan.  Sukhraj Esquivias T. Unique Searfoss, MD FACG 

## 2015-08-09 ENCOUNTER — Encounter: Payer: Self-pay | Admitting: Gastroenterology

## 2015-08-22 ENCOUNTER — Ambulatory Visit (AMBULATORY_SURGERY_CENTER): Payer: 59 | Admitting: Gastroenterology

## 2015-08-22 ENCOUNTER — Encounter: Payer: Self-pay | Admitting: Gastroenterology

## 2015-08-22 VITALS — BP 126/72 | HR 73 | Temp 97.1°F | Resp 14 | Ht 64.0 in | Wt 236.0 lb

## 2015-08-22 DIAGNOSIS — R195 Other fecal abnormalities: Secondary | ICD-10-CM | POA: Diagnosis not present

## 2015-08-22 DIAGNOSIS — K635 Polyp of colon: Secondary | ICD-10-CM

## 2015-08-22 DIAGNOSIS — K529 Noninfective gastroenteritis and colitis, unspecified: Secondary | ICD-10-CM

## 2015-08-22 DIAGNOSIS — D120 Benign neoplasm of cecum: Secondary | ICD-10-CM

## 2015-08-22 DIAGNOSIS — R933 Abnormal findings on diagnostic imaging of other parts of digestive tract: Secondary | ICD-10-CM

## 2015-08-22 DIAGNOSIS — K5140 Inflammatory polyps of colon without complications: Secondary | ICD-10-CM | POA: Diagnosis not present

## 2015-08-22 DIAGNOSIS — K921 Melena: Secondary | ICD-10-CM | POA: Diagnosis not present

## 2015-08-22 LAB — GLUCOSE, CAPILLARY
Glucose-Capillary: 102 mg/dL — ABNORMAL HIGH (ref 65–99)
Glucose-Capillary: 78 mg/dL (ref 65–99)

## 2015-08-22 MED ORDER — MESALAMINE 800 MG PO TBEC: 800.0000 mg | DELAYED_RELEASE_TABLET | Freq: Three times a day (TID) | ORAL | 0 refills | Status: DC

## 2015-08-22 NOTE — Progress Notes (Signed)
Report to PACU, RN, vss, BBS= Clear.  

## 2015-08-22 NOTE — Patient Instructions (Signed)
YOU HAD AN ENDOSCOPIC PROCEDURE TODAY AT [facility]:   Refer to the procedure report that was given to you for any specific questions about what was found during the examination.  If the procedure report does not answer your questions, please call your gastroenterologist to clarify.  If you requested that your care partner not be given the details of your procedure findings, then the procedure report has been included in a sealed envelope for you to review at your convenience later.  YOU SHOULD EXPECT: Some feelings of bloating in the abdomen. Passage of more gas than usual.  Walking can help get rid of the air that was put into your GI tract during the procedure and reduce the bloating. If you had a lower endoscopy (such as a colonoscopy or flexible sigmoidoscopy) you may notice spotting of blood in your stool or on the toilet paper. If you underwent a bowel prep for your procedure, you may not have a normal bowel movement for a few days.  Please Note:  You might notice some irritation and congestion in your nose or some drainage.  This is from the oxygen used during your procedure.  There is no need for concern and it should clear up in a day or so.  SYMPTOMS TO REPORT IMMEDIATELY:   Following lower endoscopy (colonoscopy or flexible sigmoidoscopy):  Excessive amounts of blood in the stool  Significant tenderness or worsening of abdominal pains  Swelling of the abdomen that is new, acute  Fever of 100F or higher   For urgent or emergent issues, a gastroenterologist can be reached at any hour by calling [phone].   DIET: Your first meal following the procedure should be a small meal and then it is ok to progress to your normal diet. Heavy or fried foods are harder to digest and may make you feel nauseous or bloated.  Likewise, meals heavy in dairy and vegetables can increase bloating.  Drink plenty of fluids but you should avoid alcoholic beverages for 24  hours.  ACTIVITY:  You should plan to take it easy for the rest of today and you should NOT DRIVE or use heavy machinery until tomorrow (because of the sedation medicines used during the test).    FOLLOW UP: Our staff will call the number listed on your records the next business day following your procedure to check on you and address any questions or concerns that you may have regarding the information given to you following your procedure. If we do not reach you, we will leave a message.  However, if you are feeling well and you are not experiencing any problems, there is no need to return our call.  We will assume that you have returned to your regular daily activities without incident.  If any biopsies were taken you will be contacted by phone or by letter within the next 1-3 weeks.  Please call us at [phone] if you have not heard about the biopsies in 3 weeks.    SIGNATURES/CONFIDENTIALITY: You and/or your care partner have signed paperwork which will be entered into your electronic medical record.  These signatures attest to the fact that that the information above on your After Visit Summary has been reviewed and is understood.  Full responsibility of the confidentiality of this discharge information lies with you and/or your care-partner.  Read all of the handouts given to you by your recovery room nurse.  Thank-you for choosing [facility] for your healthcare needs today.

## 2015-08-22 NOTE — Progress Notes (Signed)
Called to room to assist during endoscopic procedure.  Patient ID and intended procedure confirmed with present staff. Received instructions for my participation in the procedure from the performing physician.  

## 2015-08-22 NOTE — Addendum Note (Signed)
Addended by: Ernestine Conrad D on: 08/22/2015 04:08 PM   Modules accepted: Orders

## 2015-08-22 NOTE — Op Note (Signed)
Minor Patient Name: Brandi Dickson Procedure Date: 08/22/2015 2:43 PM MRN: [id number] Endoscopist: Ladene Artist , MD Age: 55 Referring MD:  Date of Birth: [birth year] Gender: Female Account #: [id number] Procedure:                Colonoscopy Indications:              Evaluation of unexplained GI bleeding,                            Hematochezia, Heme positive stool, Abnormal CT of                            the GI tract Medicines:                Monitored Anesthesia Care Procedure:                Pre-Anesthesia Assessment:                           - Prior to the procedure, a History and Physical                            was performed, and patient medications and                            allergies were reviewed. The patient's tolerance of                            previous anesthesia was also reviewed. The risks                            and benefits of the procedure and the sedation                            options and risks were discussed with the patient.                            All questions were answered, and informed consent                            was obtained. Prior Anticoagulants: The patient has                            taken no previous anticoagulant or antiplatelet                            agents. ASA Grade Assessment: III - A patient with                            severe systemic disease. After reviewing the risks                            and benefits, the patient was deemed in  satisfactory condition to undergo the procedure.                           After obtaining informed consent, the colonoscope                            was passed under direct vision. Throughout the                            procedure, the patient's blood pressure, pulse, and                            oxygen saturations were monitored continuously. The                            Model PCF-H190L [id number] scope was introduced                            through the anus and advanced to the the cecum,                            identified by appendiceal orifice and ileocecal                            valve. The ileocecal valve, appendiceal orifice,                            and rectum were photographed. The quality of the                            bowel preparation was good. The colonoscopy was                            performed without difficulty. The patient tolerated                            the procedure well. Scope In: 2:51:55 PM Scope Out: 3:05:36 PM Scope Withdrawal Time: 0 hours 11 minutes 18 seconds  Total Procedure Duration: 0 hours 13 minutes 41 seconds  Findings:                 A 6 mm polyp was found in the cecum. The polyp was                            sessile. The polyp was removed with a cold snare.                            Resection and retrieval were complete.                           Diffuse moderate inflammation characterized by                            congestion (edema), erythema, friability,  granularity and loss of vascularity was found in                            the rectum, in the sigmoid colon, in the descending                            colon and at the splenic flexure. Biopsies were                            taken with a cold forceps for histology.                           Diffuse mild inflammation characterized by                            congestion (edema), friability and granularity was                            found in the transverse colon and at the hepatic                            flexure. Biopsies were taken with a cold forceps                            for histology.                           The exam was otherwise normal throughout the                            examined colon. Random biopsies taken in the                            ascending colon and cecum. Several attempts were                            made to enter the  TI that were not successful. Complications:            No immediate complications. Estimated blood loss:                            None. Estimated Blood Loss:     Estimated blood loss: none. Impression:               - One 6 mm polyp in the cecum, removed with a cold                            snare. Resected and retrieved.                           - Diffuse moderate inflammation was found in the                            rectum, in the sigmoid colon, in the descending  colon and at the splenic flexure secondary to                            ulcerative colitis. Biopsied.                           - Diffuse mild inflammation was found in the                            transverse colon and at the hepatic flexure                            secondary to ulcerative colitis. Biopsied. Recommendation:           - Repeat colonoscopy date to be determined after                            pending pathology results are reviewed for                            surveillance.                           - Patient has a contact number available for                            emergencies. The signs and symptoms of potential                            delayed complications were discussed with the                            patient. Return to normal activities tomorrow.                            Written discharge instructions were provided to the                            patient.                           - Resume previous diet.                           - Continue present medications.                           - Await pathology results.                           - Asacol HD 800 mg 2 tabs PO TID.                           - Return to my office in 1 month. Ladene Artist, MD 08/22/2015 3:13:37 PM This report has been signed electronically.

## 2015-08-23 ENCOUNTER — Telehealth: Payer: Self-pay | Admitting: *Deleted

## 2015-08-23 NOTE — Telephone Encounter (Signed)
  Follow up Call-  Call back number 08/22/2015  Post procedure Call Back phone  # [phone]  Permission to leave phone message Yes  Some recent data might be hidden     Patient questions:  Left message on f/u call

## 2015-08-31 ENCOUNTER — Encounter: Payer: Self-pay | Admitting: Gastroenterology

## 2015-09-28 ENCOUNTER — Ambulatory Visit: Payer: 59 | Admitting: Gastroenterology

## 2015-10-07 ENCOUNTER — Encounter (INDEPENDENT_AMBULATORY_CARE_PROVIDER_SITE_OTHER): Payer: Self-pay

## 2015-10-07 ENCOUNTER — Ambulatory Visit (INDEPENDENT_AMBULATORY_CARE_PROVIDER_SITE_OTHER): Payer: 59 | Admitting: Gastroenterology

## 2015-10-07 ENCOUNTER — Encounter: Payer: Self-pay | Admitting: Gastroenterology

## 2015-10-07 VITALS — BP 124/70 | Ht 64.0 in | Wt 224.6 lb

## 2015-10-07 DIAGNOSIS — K5190 Ulcerative colitis, unspecified, without complications: Secondary | ICD-10-CM | POA: Diagnosis not present

## 2015-10-07 DIAGNOSIS — K219 Gastro-esophageal reflux disease without esophagitis: Secondary | ICD-10-CM | POA: Diagnosis not present

## 2015-10-07 NOTE — Patient Instructions (Signed)
Follow up with Dr. Fuller Plan in one year.  Normal BMI (Body Mass Index- based on height and weight) is between 19 and 25. Your BMI today is Body mass index is 38.55 kg/m. [name] Kitchen Please consider follow up  regarding your BMI with your Primary Care Provider.  Thank you for choosing me and [facility].  Pricilla Riffle. Dagoberto Ligas., MD., Marval Regal

## 2015-10-07 NOTE — Progress Notes (Signed)
    History of Present Illness: This is a 55 year-old female returning for follow-up of bloody diarrhea. See CT imaging and colonoscopy results as below. She's been treated with Asacol HD 800 mg 3 times a day with good relief of all symptoms. She has occasional slightly loose stool with high fiber foods and milk products and greasy foods. When she avoids these she has no gastrointestinal complaints.  Abdominal/pelvic CT 07/15/2015 IMPRESSION: 1. Left lower quadrant inflammatory process associated with sigmoid colon wall thickening and surrounding inflammation, most consistent with acute diverticulitis. There is possible minimal adjacent extraluminal air, although this could be contained within a diverticulum. No evidence of free air, abscess or obstruction. 2. Nonspecific small pelvic and retroperitoneal lymph nodes, possibly reactive. 3. Follow-up CT or colonoscopy recommended after treatment of diverticulitis to exclude underlying mucosal neoplasm. 4. Probable incidental left adrenal adenoma, grossly stable from previous lumbar MRI  Colonoscopy 08/22/2015: - One 6 mm polyp in the cecum, removed with a cold snare. Resected and retrieved. - Diffuse moderate inflammation was found in the rectum, in the sigmoid colon, in the descending colon and at the splenic flexure secondary to ulcerative colitis. Biopsied. - Diffuse mild inflammation was found in the transverse colon and at the hepatic flexure secondary to ulcerative colitis.  - Biopsies showed chronic mild to moderately active colitis from rectum to transverse colon. Inflammatory polyp.   Current Medications, Allergies, Past Medical History, Past Surgical History, Family History and Social History were reviewed in [employer] record.  Physical Exam: General: Well developed, well nourished, no acute distress Head: Normocephalic and atraumatic Eyes:  sclerae anicteric, EOMI Ears: Normal auditory acuity Mouth: No  deformity or lesions Lungs: Clear throughout to auscultation Heart: Regular rate and rhythm; no murmurs, rubs or bruits Abdomen: Soft, non tender and non distended. No masses, hepatosplenomegaly or hernias noted. Normal Bowel sounds Musculoskeletal: Symmetrical with no gross deformities  Pulses:  Normal pulses noted Extremities: No clubbing, cyanosis, edema or deformities noted Neurological: Alert oriented x 4, grossly nonfocal Psychological:  Alert and cooperative. Normal mood and affect  Assessment and Recommendations:  1. Ulcerative colitis, rectum to hepatic flexure, symptoms under good control. Asacol HD 800 mg po tid. Long discussion about management of UC in the short and long-term. Avoid/minimize foods that trigger GI symptoms. REV in 1 year and prn.  2. GERD. Well controlled with standard antireflux measures and Nexium 20 mg daily.  I spent 15 minutes of face-to-face time with the patient. Greater than 50% of the time was spent counseling and coordinating care.

## 2016-02-13 DIAGNOSIS — E119 Type 2 diabetes mellitus without complications: Secondary | ICD-10-CM | POA: Diagnosis not present

## 2016-02-13 DIAGNOSIS — E1122 Type 2 diabetes mellitus with diabetic chronic kidney disease: Secondary | ICD-10-CM | POA: Diagnosis not present

## 2016-02-13 DIAGNOSIS — Z01818 Encounter for other preprocedural examination: Secondary | ICD-10-CM | POA: Diagnosis not present

## 2016-02-13 DIAGNOSIS — Z7984 Long term (current) use of oral hypoglycemic drugs: Secondary | ICD-10-CM | POA: Diagnosis not present

## 2016-02-24 NOTE — Patient Instructions (Addendum)
Brandi Dickson  02/24/2016   Your procedure is scheduled on: Tuesday  03/06/2016  Report to Brandi Dickson take Brandi Dickson  elevators to 3rd floor to  Brandi Dickson.  Call this number if you have problems the morning of surgery [phone]   Remember: ONLY 1 PERSON MAY GO WITH YOU TO SHORT STAY TO GET  READY MORNING OF Brandi Dickson.    Do not eat food or drink liquids :After Midnight.    How to Manage Your Diabetes Before and After Surgery  Why is it important to control my blood sugar before and after surgery? . Improving blood sugar levels before and after surgery helps healing and can limit problems. . A way of improving blood sugar control is eating a healthy diet by: o  Eating less sugar and carbohydrates o  Increasing activity/exercise o  Talking with your doctor about reaching your blood sugar goals . High blood sugars (greater than 180 mg/dL) can raise your risk of infections and slow your recovery, so you will need to focus on controlling your diabetes during the weeks before surgery. . Make sure that the doctor who takes care of your diabetes knows about your planned surgery including the date and location.  How do I manage my blood sugar before surgery? . Check your blood sugar at least 4 times a day, starting 2 days before surgery, to make sure that the level is not too high or low. o Check your blood sugar the morning of your surgery when you wake up and every 2 hours until you get to the Short Stay unit. . If your blood sugar is less than 70 mg/dL, you will need to treat for low blood sugar: o Do not take insulin. o Treat a low blood sugar (less than 70 mg/dL) with  cup of clear juice (cranberry or apple), 4 glucose tablets, OR glucose gel. o Recheck blood sugar in 15 minutes after treatment (to make sure it is greater than 70 mg/dL). If your blood sugar is not greater than 70 mg/dL on recheck, call [phone] for  further instructions. . Report your blood sugar to the short stay nurse when you get to Short Stay.  . If you are admitted to the hospital after surgery: o Your blood sugar will be checked by the staff and you will probably be given insulin after surgery (instead of oral diabetes medicines) to make sure you have good blood sugar levels. o The goal for blood sugar control after surgery is 80-180 mg/dL.   WHAT DO I DO ABOUT MY DIABETES MEDICATION?  [name] Kitchen Do not take oral diabetes medicines (pills) the morning of surgery.!     Take these medicines the morning of surgery with A SIP OF WATER: Metoprolol                               You may not have any metal on your body including hair pins and              piercings  Do not wear jewelry, make-up, lotions, powders or perfumes, deodorant             Do not wear nail polish.  Do not shave  48 hours prior to surgery.  Men may shave face and neck.   Do not bring valuables to the hospital. [name].  Contacts, dentures or bridgework may not be worn into surgery.  Leave suitcase in the car. After surgery it may be brought to your room.                  Please read over the following fact sheets you were given: _____________________________________________________________________             Brandi Dickson - Preparing for Surgery Before surgery, you can play an important role.  Because skin is not sterile, your skin needs to be as free of germs as possible.  You can reduce the number of germs on your skin by washing with CHG (chlorahexidine gluconate) soap before surgery.  CHG is an antiseptic cleaner which kills germs and bonds with the skin to continue killing germs even after washing. Please DO NOT use if you have an allergy to CHG or antibacterial soaps.  If your skin becomes reddened/irritated stop using the CHG and inform your nurse when you arrive at Short Stay. Do not shave  (including legs and underarms) for at least 48 hours prior to the first CHG shower.  You may shave your face/neck. Please follow these instructions carefully:  1.  Shower with CHG Soap the night before surgery and the  morning of Surgery.  2.  If you choose to wash your hair, wash your hair first as usual with your  normal  shampoo.  3.  After you shampoo, rinse your hair and body thoroughly to remove the  shampoo.                           4.  Use CHG as you would any other liquid soap.  You can apply chg directly  to the skin and wash                       Gently with a scrungie or clean washcloth.  5.  Apply the CHG Soap to your body ONLY FROM THE NECK DOWN.   Do not use on face/ open                           Wound or open sores. Avoid contact with eyes, ears mouth and genitals (private parts).                       Wash face,  Genitals (private parts) with your normal soap.             6.  Wash thoroughly, paying special attention to the area where your surgery  will be performed.  7.  Thoroughly rinse your body with warm water from the neck down.  8.  DO NOT shower/wash with your normal soap after using and rinsing off  the CHG Soap.                9.  Pat yourself dry with a clean towel.            10.  Wear clean pajamas.            11.  Place clean sheets on your bed the night of your first shower and do not  sleep with  pets. Day of Surgery : Do not apply any lotions/deodorants the morning of surgery.  Please wear clean clothes to the hospital/surgery center.  FAILURE TO FOLLOW THESE INSTRUCTIONS MAY RESULT IN THE CANCELLATION OF YOUR SURGERY PATIENT SIGNATURE_________________________________  NURSE SIGNATURE__________________________________  ________________________________________________________________________   Brandi Dickson  An incentive spirometer is a tool that can help keep your lungs clear and active. This tool measures how well you are filling your lungs with  each breath. Taking long deep breaths may help reverse or decrease the chance of developing breathing (pulmonary) problems (especially infection) following:  A long period of time when you are unable to move or be active. BEFORE THE PROCEDURE   If the spirometer includes an indicator to show your best effort, your nurse or respiratory therapist will set it to a desired goal.  If possible, sit up straight or lean slightly forward. Try not to slouch.  Hold the incentive spirometer in an upright position. INSTRUCTIONS FOR USE  1. Sit on the edge of your bed if possible, or sit up as far as you can in bed or on a chair. 2. Hold the incentive spirometer in an upright position. 3. Breathe out normally. 4. Place the mouthpiece in your mouth and seal your lips tightly around it. 5. Breathe in slowly and as deeply as possible, raising the piston or the ball toward the top of the column. 6. Hold your breath for 3-5 seconds or for as long as possible. Allow the piston or ball to fall to the bottom of the column. 7. Remove the mouthpiece from your mouth and breathe out normally. 8. Rest for a few seconds and repeat Steps 1 through 7 at least 10 times every 1-2 hours when you are awake. Take your time and take a few normal breaths between deep breaths. 9. The spirometer may include an indicator to show your best effort. Use the indicator as a goal to work toward during each repetition. 10. After each set of 10 deep breaths, practice coughing to be sure your lungs are clear. If you have an incision (the cut made at the time of surgery), support your incision when coughing by placing a pillow or rolled up towels firmly against it. Once you are able to get out of bed, walk around indoors and cough well. You may stop using the incentive spirometer when instructed by your caregiver.  RISKS AND COMPLICATIONS  Take your time so you do not get dizzy or light-headed.  If you are in pain, you may need to take or  ask for pain medication before doing incentive spirometry. It is harder to take a deep breath if you are having pain. AFTER USE  Rest and breathe slowly and easily.  It can be helpful to keep track of a log of your progress. Your caregiver can provide you with a simple table to help with this. If you are using the spirometer at home, follow these instructions: [place] IF:   You are having difficultly using the spirometer.  You have trouble using the spirometer as often as instructed.  Your pain medication is not giving enough relief while using the spirometer.  You develop fever of 100.5 F (38.1 C) or higher. SEEK IMMEDIATE MEDICAL CARE IF:   You cough up bloody sputum that had not been present before.  You develop fever of 102 F (38.9 C) or greater.  You develop worsening pain at or near the incision site. MAKE SURE YOU:   Understand these  instructions.  Will watch your condition.  Will get help right away if you are not doing well or get worse. Document Released: 05/28/2006 Document Revised: 04/09/2011 Document Reviewed: 07/29/2006 ExitCare Patient Information 2014 ExitCare, [place].   ________________________________________________________________________  WHAT IS A BLOOD TRANSFUSION? Blood Transfusion Information  A transfusion is the replacement of blood or some of its parts. Blood is made up of multiple cells which provide different functions.  Red blood cells carry oxygen and are used for blood loss replacement.  White blood cells fight against infection.  Platelets control bleeding.  Plasma helps clot blood.  Other blood products are available for specialized needs, such as hemophilia or other clotting disorders. BEFORE THE TRANSFUSION  Who gives blood for transfusions?   Healthy volunteers who are fully evaluated to make sure their blood is safe. This is blood bank blood. Transfusion therapy is the safest it has ever been in the practice of  medicine. Before blood is taken from a donor, a complete history is taken to make sure that person has no history of diseases nor engages in risky social behavior (examples are intravenous drug use or sexual activity with multiple partners). The donor's travel history is screened to minimize risk of transmitting infections, such as malaria. The donated blood is tested for signs of infectious diseases, such as HIV and hepatitis. The blood is then tested to be sure it is compatible with you in order to minimize the chance of a transfusion reaction. If you or a relative donates blood, this is often done in anticipation of surgery and is not appropriate for emergency situations. It takes many days to process the donated blood. RISKS AND COMPLICATIONS Although transfusion therapy is very safe and saves many lives, the main dangers of transfusion include:   Getting an infectious disease.  Developing a transfusion reaction. This is an allergic reaction to something in the blood you were given. Every precaution is taken to prevent this. The decision to have a blood transfusion has been considered carefully by your caregiver before blood is given. Blood is not given unless the benefits outweigh the risks. AFTER THE TRANSFUSION  Right after receiving a blood transfusion, you will usually feel much better and more energetic. This is especially true if your red blood cells have gotten low (anemic). The transfusion raises the level of the red blood cells which carry oxygen, and this usually causes an energy increase.  The nurse administering the transfusion will monitor you carefully for complications. HOME CARE INSTRUCTIONS  No special instructions are needed after a transfusion. You may find your energy is better. Speak with your caregiver about any limitations on activity for underlying diseases you may have. SEEK MEDICAL CARE IF:   Your condition is not improving after your transfusion.  You develop  redness or irritation at the intravenous (IV) site. SEEK IMMEDIATE MEDICAL CARE IF:  Any of the following symptoms occur over the next 12 hours:  Shaking chills.  You have a temperature by mouth above 102 F (38.9 C), not controlled by medicine.  Chest, back, or muscle pain.  People around you feel you are not acting correctly or are confused.  Shortness of breath or difficulty breathing.  Dizziness and fainting.  You get a rash or develop hives.  You have a decrease in urine output.  Your urine turns a dark color or changes to pink, red, or brown. Any of the following symptoms occur over the next 10 days:  You have a temperature by mouth  above 102 F (38.9 C), not controlled by medicine.  Shortness of breath.  Weakness after normal activity.  The white part of the eye turns yellow (jaundice).  You have a decrease in the amount of urine or are urinating less often.  Your urine turns a dark color or changes to pink, red, or brown. Document Released: 01/13/2000 Document Revised: 04/09/2011 Document Reviewed: 09/01/2007 [facility] Patient Information 2014 [place], [place].  _______________________________________________________________________

## 2016-02-27 ENCOUNTER — Encounter (HOSPITAL_COMMUNITY)
Admission: RE | Admit: 2016-02-27 | Discharge: 2016-02-27 | Disposition: A | Discharge status: Home / Self Care | Payer: 59 | Source: Ambulatory Visit | Attending: Orthopedic Surgery | Admitting: Orthopedic Surgery

## 2016-02-27 ENCOUNTER — Encounter (HOSPITAL_COMMUNITY): Payer: Self-pay

## 2016-02-27 DIAGNOSIS — Z01818 Encounter for other preprocedural examination: Secondary | ICD-10-CM | POA: Insufficient documentation

## 2016-02-27 DIAGNOSIS — E119 Type 2 diabetes mellitus without complications: Secondary | ICD-10-CM | POA: Insufficient documentation

## 2016-02-27 DIAGNOSIS — I10 Essential (primary) hypertension: Secondary | ICD-10-CM | POA: Insufficient documentation

## 2016-02-27 DIAGNOSIS — M1712 Unilateral primary osteoarthritis, left knee: Secondary | ICD-10-CM | POA: Diagnosis not present

## 2016-02-27 HISTORY — DX: Unspecified osteoarthritis, unspecified site: M19.90

## 2016-02-27 HISTORY — DX: Unspecified asthma, uncomplicated: J45.909

## 2016-02-27 LAB — CBC
HCT: 40.9 % (ref 36.0–46.0)
Hemoglobin: 13.1 g/dL (ref 12.0–15.0)
MCH: 26.8 pg (ref 26.0–34.0)
MCHC: 32 g/dL (ref 30.0–36.0)
MCV: 83.8 fL (ref 78.0–100.0)
PLATELETS: 264 10*3/uL (ref 150–400)
RBC: 4.88 MIL/uL (ref 3.87–5.11)
RDW: 14.9 % (ref 11.5–15.5)
WBC: 7.9 10*3/uL (ref 4.0–10.5)

## 2016-02-27 LAB — SURGICAL PCR SCREEN
MRSA, PCR: NEGATIVE
STAPHYLOCOCCUS AUREUS: POSITIVE — AB

## 2016-02-27 LAB — BASIC METABOLIC PANEL
Anion gap: 10 (ref 5–15)
BUN: 12 mg/dL (ref 6–20)
CO2: 28 mmol/L (ref 22–32)
CREATININE: 0.95 mg/dL (ref 0.44–1.00)
Calcium: 9.5 mg/dL (ref 8.9–10.3)
Chloride: 100 mmol/L — ABNORMAL LOW (ref 101–111)
GFR calc Af Amer: >60 mL/min (ref >60)
GLUCOSE: 96 mg/dL (ref 65–99)
POTASSIUM: 3.8 mmol/L (ref 3.5–5.1)
Sodium: 138 mmol/L (ref 135–145)

## 2016-02-27 LAB — GLUCOSE, CAPILLARY: Glucose-Capillary: 100 mg/dL — ABNORMAL HIGH (ref 65–99)

## 2016-02-28 LAB — HEMOGLOBIN A1C
Hgb A1c MFr Bld: 5.7 % — ABNORMAL HIGH (ref 4.8–5.6)
Mean Plasma Glucose: 117 mg/dL

## 2016-03-04 NOTE — H&P (Signed)
TOTAL HIP ADMISSION H&P  Patient is admitted for left total hip arthroplasty, anterior approach.  Subjective:  Chief Complaint:    Left hip primary OA / pain  HPI: Brandi Dickson, 56 y.o. female, has a history of pain and functional disability in the left hip(s) due to arthritis and patient has failed non-surgical conservative treatments for greater than 12 weeks to include NSAID's and/or analgesics, use of assistive devices and activity modification.  Onset of symptoms was gradual starting 1+ years ago with gradually worsening course since that time.The patient noted prior procedures of the hip to include arthroplasty on the right hip in 2010.  Patient currently rates pain in the left hip at 10 out of 10 with activity. Patient has night pain, worsening of pain with activity and weight bearing, trendelenberg gait, pain that interfers with activities of daily living and pain with passive range of motion. Patient has evidence of periarticular osteophytes and joint space narrowing by imaging studies. This condition presents safety issues increasing the risk of falls.   There is no current active infection.   Risks, benefits and expectations were discussed with the patient.  Risks including but not limited to the risk of anesthesia, blood clots, nerve damage, blood vessel damage, failure of the prosthesis, infection and up to and including death.  Patient understand the risks, benefits and expectations and wishes to proceed with surgery.   PCP: Orpah Melter, MD  D/C Plans:       Home - No PT,  HEP  Post-op Meds:       No Rx given  Tranexamic Acid:      To be given - IV   Decadron:      Is to be given  FYI:     ASA  Tramadol and APAP     Patient Active Problem List   Diagnosis Date Noted  . Rectal bleeding 07/14/2015  . LLQ abdominal pain 07/14/2015  . Special screening for malignant neoplasms, colon 07/14/2015  . Loss of weight 07/14/2015  . Family history of colon cancer 07/14/2015    Past Medical History:  Diagnosis Date  . Arthritis   . Asthma    triggered with [name] perfumes and cigarette smoke  . Colitis   . Diabetes mellitus without complication ([place])   . Hypertension   . Neuropathy [facility])     Past Surgical History:  Procedure Laterality Date  . ABDOMINAL HYSTERECTOMY    . DILATION AND CURETTAGE OF UTERUS    . TONSILLECTOMY    . TOTAL HIP ARTHROPLASTY Right     No prescriptions prior to admission.   Allergies  Allergen Reactions  . Lisinopril Palpitations  . Augmentin [Amoxicillin-Pot Clavulanate] Other (See Comments)    sts gives her a yeast infection    Social History  Substance Use Topics  . Smoking status: Former Smoker    Packs/day: 1.00    Years: 29.00    Types: Cigarettes    Quit date: 11/10/2002  . Smokeless tobacco: Never Used  . Alcohol use No    Family History  Problem Relation Age of Onset  . Colon cancer Father   . Stomach cancer Paternal Uncle   . Stomach cancer Paternal Uncle      Review of Systems  Constitutional: Negative.   HENT: Negative.   Eyes: Negative.   Respiratory: Negative.   Cardiovascular: Negative.   Gastrointestinal: Negative.   Genitourinary: Negative.   Musculoskeletal: Positive for joint pain.  Skin: Negative.   Neurological:  Negative.   Endo/Heme/Allergies: Negative.   Psychiatric/Behavioral: Negative.     Objective:  Physical Exam  Constitutional: She is oriented to person, place, and time. She appears well-developed.  HENT:  Head: Normocephalic.  Eyes: Pupils are equal, round, and reactive to light.  Neck: Neck supple. No JVD present. No tracheal deviation present. No thyromegaly present.  Cardiovascular: Normal rate, regular rhythm, normal heart sounds and intact distal pulses.   Respiratory: Effort normal and breath sounds normal. No respiratory distress. She has no wheezes.  GI: Soft. There is no tenderness. There is no guarding.  Musculoskeletal:       Left hip: She exhibits  decreased range of motion, decreased strength, tenderness and bony tenderness. She exhibits no swelling, no deformity and no laceration.  Lymphadenopathy:    She has no cervical adenopathy.  Neurological: She is alert and oriented to person, place, and time. A sensory deficit (bilateral DM neuropathy) is present.  Skin: Skin is warm and dry.  Psychiatric: She has a normal mood and affect.       Labs:  Estimated body mass index is 38.24 kg/m as calculated from the following:   Height as of 02/27/16: 5\' 4"  (1.626 m).   Weight as of 02/27/16: 101.1 kg (222 lb 12.8 oz).   Imaging Review Plain radiographs demonstrate severe degenerative joint disease of the left hip(s). The bone quality appears to be good for age and reported activity level.  Assessment/Plan:  End stage arthritis, left hip(s)  The patient history, physical examination, clinical judgement of the provider and imaging studies are consistent with end stage degenerative joint disease of the left hip(s) and total hip arthroplasty is deemed medically necessary. The treatment options including medical management, injection therapy, arthroscopy and arthroplasty were discussed at length. The risks and benefits of total hip arthroplasty were presented and reviewed. The risks due to aseptic loosening, infection, stiffness, dislocation/subluxation,  thromboembolic complications and other imponderables were discussed.  The patient acknowledged the explanation, agreed to proceed with the plan and consent was signed. Patient is being admitted for inpatient treatment for surgery, pain control, PT, OT, prophylactic antibiotics, VTE prophylaxis, progressive ambulation and ADL's and discharge planning.The patient is planning to be discharged home.      West Pugh Jhada Risk   PA-C  03/04/2016, 2:37 PM

## 2016-03-05 NOTE — Anesthesia Preprocedure Evaluation (Addendum)
Anesthesia Evaluation  Patient identified by MRN, date of birth, ID band Patient awake    Reviewed: Allergy & Precautions, H&P , NPO status , Patient's Chart, lab work & pertinent test results, reviewed documented beta blocker date and time   Airway Mallampati: II  TM Distance: >3 FB Neck ROM: Full    Dental no notable dental hx. (+) Teeth Intact, Dental Advisory Given   Pulmonary asthma , former smoker,    Pulmonary exam normal breath sounds clear to auscultation       Cardiovascular Exercise Tolerance: Good hypertension, Pt. on medications and Pt. on home beta blockers  Rhythm:Regular Rate:Normal     Neuro/Psych negative neurological ROS  negative psych ROS   GI/Hepatic negative GI ROS, Neg liver ROS,   Endo/Other  diabetes, Type 2, Oral Hypoglycemic AgentsMorbid obesity  Renal/GU negative Renal ROS  negative genitourinary   Musculoskeletal  (+) Arthritis , Osteoarthritis,    Abdominal   Peds  Hematology negative hematology ROS (+)   Anesthesia Other Findings   Reproductive/Obstetrics negative OB ROS                            Anesthesia Physical Anesthesia Plan  ASA: III  Anesthesia Plan: Spinal   Post-op Pain Management:    Induction: Intravenous  Airway Management Planned: Simple Face Mask  Additional Equipment:   Intra-op Plan:   Post-operative Plan:   Informed Consent: I have reviewed the patients History and Physical, chart, labs and discussed the procedure including the risks, benefits and alternatives for the proposed anesthesia with the patient or authorized representative who has indicated his/her understanding and acceptance.   Dental advisory given  Plan Discussed with: CRNA  Anesthesia Plan Comments:         Anesthesia Quick Evaluation

## 2016-03-06 ENCOUNTER — Inpatient Hospital Stay (HOSPITAL_COMMUNITY): Payer: 59

## 2016-03-06 ENCOUNTER — Inpatient Hospital Stay (HOSPITAL_COMMUNITY): Payer: 59 | Admitting: Anesthesiology

## 2016-03-06 ENCOUNTER — Encounter (HOSPITAL_COMMUNITY): Payer: Self-pay | Admitting: Anesthesiology

## 2016-03-06 ENCOUNTER — Encounter (HOSPITAL_COMMUNITY)
Admission: RE | Disposition: A | Discharge status: Home / Self Care | Payer: Self-pay | Source: Ambulatory Visit | Attending: Orthopedic Surgery

## 2016-03-06 ENCOUNTER — Inpatient Hospital Stay (HOSPITAL_COMMUNITY)
Admission: RE | Admit: 2016-03-06 | Discharge: 2016-03-07 | DRG: 470 | Disposition: A | Discharge status: Home / Self Care | Payer: 59 | Source: Ambulatory Visit | Attending: Orthopedic Surgery | Admitting: Orthopedic Surgery

## 2016-03-06 DIAGNOSIS — Z7984 Long term (current) use of oral hypoglycemic drugs: Secondary | ICD-10-CM | POA: Diagnosis not present

## 2016-03-06 DIAGNOSIS — I10 Essential (primary) hypertension: Secondary | ICD-10-CM | POA: Diagnosis present

## 2016-03-06 DIAGNOSIS — K625 Hemorrhage of anus and rectum: Secondary | ICD-10-CM | POA: Diagnosis not present

## 2016-03-06 DIAGNOSIS — Z87891 Personal history of nicotine dependence: Secondary | ICD-10-CM

## 2016-03-06 DIAGNOSIS — Z471 Aftercare following joint replacement surgery: Secondary | ICD-10-CM | POA: Diagnosis not present

## 2016-03-06 DIAGNOSIS — Z79899 Other long term (current) drug therapy: Secondary | ICD-10-CM

## 2016-03-06 DIAGNOSIS — Z6838 Body mass index (BMI) 38.0-38.9, adult: Secondary | ICD-10-CM | POA: Diagnosis not present

## 2016-03-06 DIAGNOSIS — E1140 Type 2 diabetes mellitus with diabetic neuropathy, unspecified: Secondary | ICD-10-CM | POA: Diagnosis present

## 2016-03-06 DIAGNOSIS — Z96649 Presence of unspecified artificial hip joint: Secondary | ICD-10-CM

## 2016-03-06 DIAGNOSIS — M1612 Unilateral primary osteoarthritis, left hip: Principal | ICD-10-CM | POA: Diagnosis present

## 2016-03-06 DIAGNOSIS — M25552 Pain in left hip: Secondary | ICD-10-CM | POA: Diagnosis not present

## 2016-03-06 DIAGNOSIS — Z96641 Presence of right artificial hip joint: Secondary | ICD-10-CM | POA: Diagnosis present

## 2016-03-06 DIAGNOSIS — Z96642 Presence of left artificial hip joint: Secondary | ICD-10-CM | POA: Diagnosis not present

## 2016-03-06 DIAGNOSIS — J45909 Unspecified asthma, uncomplicated: Secondary | ICD-10-CM | POA: Diagnosis present

## 2016-03-06 HISTORY — PX: TOTAL HIP ARTHROPLASTY: SHX124

## 2016-03-06 LAB — GLUCOSE, CAPILLARY
Glucose-Capillary: 103 mg/dL — ABNORMAL HIGH (ref 65–99)
Glucose-Capillary: 144 mg/dL — ABNORMAL HIGH (ref 65–99)
Glucose-Capillary: 187 mg/dL — ABNORMAL HIGH (ref 65–99)
Glucose-Capillary: 153 mg/dL — ABNORMAL HIGH (ref 65–99)

## 2016-03-06 LAB — TYPE AND SCREEN
ABO/RH(D): O POS
ANTIBODY SCREEN: NEGATIVE

## 2016-03-06 SURGERY — ARTHROPLASTY, HIP, TOTAL, ANTERIOR APPROACH
Anesthesia: Spinal | Site: Hip | Laterality: Left

## 2016-03-06 MED ORDER — METHOCARBAMOL 500 MG PO TABS: 500.0000 mg | ORAL_TABLET | Freq: Four times a day (QID) | ORAL | 0 refills | Status: DC | PRN

## 2016-03-06 MED ORDER — MENTHOL 3 MG MT LOZG: 1.0000 | LOZENGE | OROMUCOSAL | Status: DC | PRN

## 2016-03-06 MED ORDER — TRAMADOL HCL 50 MG PO TABS: 50.0000 mg | ORAL_TABLET | Freq: Four times a day (QID) | ORAL | 0 refills | Status: DC | PRN

## 2016-03-06 MED ORDER — DEXAMETHASONE SODIUM PHOSPHATE 10 MG/ML IJ SOLN
10.0000 mg | Freq: Once | INTRAMUSCULAR | Status: AC
  Administered 2016-03-07: 10 mg via INTRAVENOUS
  Filled 2016-03-06: qty 1

## 2016-03-06 MED ORDER — ONDANSETRON HCL 4 MG PO TABS: 4.0000 mg | ORAL_TABLET | Freq: Four times a day (QID) | ORAL | Status: DC | PRN

## 2016-03-06 MED ORDER — CEFAZOLIN SODIUM-DEXTROSE 2-4 GM/100ML-% IV SOLN
INTRAVENOUS | Status: AC
  Filled 2016-03-06: qty 100

## 2016-03-06 MED ORDER — CELECOXIB 200 MG PO CAPS
200.0000 mg | ORAL_CAPSULE | Freq: Two times a day (BID) | ORAL | Status: DC
  Administered 2016-03-06 – 2016-03-07 (×2): 200 mg via ORAL
  Filled 2016-03-06 (×2): qty 1

## 2016-03-06 MED ORDER — ACETAMINOPHEN 500 MG PO TABS
1000.0000 mg | ORAL_TABLET | Freq: Three times a day (TID) | ORAL | Status: DC
  Administered 2016-03-06: 1000 mg via ORAL
  Filled 2016-03-06: qty 2

## 2016-03-06 MED ORDER — DOCUSATE SODIUM 100 MG PO CAPS
100.0000 mg | ORAL_CAPSULE | Freq: Two times a day (BID) | ORAL | Status: DC
  Administered 2016-03-07: 100 mg via ORAL
  Filled 2016-03-06 (×2): qty 1

## 2016-03-06 MED ORDER — METHOCARBAMOL 1000 MG/10ML IJ SOLN
500.0000 mg | Freq: Four times a day (QID) | INTRAVENOUS | Status: DC | PRN
  Administered 2016-03-06: 500 mg via INTRAVENOUS
  Filled 2016-03-06: qty 550
  Filled 2016-03-06: qty 5

## 2016-03-06 MED ORDER — HYDROCORTISONE ACETATE 25 MG RE SUPP: 25.0000 mg | Freq: Two times a day (BID) | RECTAL | Status: DC

## 2016-03-06 MED ORDER — BISMUTH SUBSALICYLATE 262 MG/15ML PO SUSP: 30.0000 mL | Freq: Four times a day (QID) | ORAL | Status: DC | PRN

## 2016-03-06 MED ORDER — ESOMEPRAZOLE MAGNESIUM 20 MG PO CPDR
20.0000 mg | DELAYED_RELEASE_CAPSULE | Freq: Every day | ORAL | Status: DC
Start: 2016-03-06 — End: 2016-03-07
  Filled 2016-03-06 (×2): qty 1

## 2016-03-06 MED ORDER — MIDAZOLAM HCL 5 MG/5ML IJ SOLN
INTRAMUSCULAR | Status: DC | PRN
  Administered 2016-03-06: 2 mg via INTRAVENOUS

## 2016-03-06 MED ORDER — DIPHENHYDRAMINE HCL 25 MG PO CAPS: 25.0000 mg | ORAL_CAPSULE | Freq: Four times a day (QID) | ORAL | Status: DC | PRN

## 2016-03-06 MED ORDER — GABAPENTIN 100 MG PO CAPS
200.0000 mg | ORAL_CAPSULE | Freq: Every day | ORAL | Status: DC
  Administered 2016-03-06: 100 mg via ORAL
  Filled 2016-03-06: qty 2

## 2016-03-06 MED ORDER — DICYCLOMINE HCL 20 MG PO TABS
20.0000 mg | ORAL_TABLET | Freq: Two times a day (BID) | ORAL | Status: DC
  Administered 2016-03-07: 20 mg via ORAL
  Filled 2016-03-06 (×2): qty 1

## 2016-03-06 MED ORDER — SUCRALFATE 1 G PO TABS: 1.0000 g | ORAL_TABLET | Freq: Three times a day (TID) | ORAL | Status: DC

## 2016-03-06 MED ORDER — HYDROMORPHONE HCL 1 MG/ML IJ SOLN
INTRAMUSCULAR | Status: AC
  Administered 2016-03-06: 0.5 mg via INTRAVENOUS
  Filled 2016-03-06: qty 1

## 2016-03-06 MED ORDER — HYDROCODONE-ACETAMINOPHEN 7.5-325 MG PO TABS
1.0000 | ORAL_TABLET | ORAL | Status: DC
  Administered 2016-03-06 – 2016-03-07 (×6): 1 via ORAL
  Filled 2016-03-06 (×6): qty 1

## 2016-03-06 MED ORDER — METOCLOPRAMIDE HCL 5 MG PO TABS: 5.0000 mg | ORAL_TABLET | Freq: Three times a day (TID) | ORAL | Status: DC | PRN

## 2016-03-06 MED ORDER — METHOCARBAMOL 500 MG PO TABS
500.0000 mg | ORAL_TABLET | Freq: Four times a day (QID) | ORAL | Status: DC | PRN
  Administered 2016-03-07: 500 mg via ORAL
  Filled 2016-03-06: qty 1

## 2016-03-06 MED ORDER — CEFAZOLIN SODIUM-DEXTROSE 2-4 GM/100ML-% IV SOLN
2.0000 g | Freq: Four times a day (QID) | INTRAVENOUS | Status: AC
  Administered 2016-03-06 (×2): 2 g via INTRAVENOUS
  Filled 2016-03-06 (×2): qty 100

## 2016-03-06 MED ORDER — KETAMINE HCL 10 MG/ML IJ SOLN
INTRAMUSCULAR | Status: AC
  Filled 2016-03-06: qty 1

## 2016-03-06 MED ORDER — SODIUM CHLORIDE 0.9 % IV SOLN
1000.0000 mg | INTRAVENOUS | Status: AC
  Administered 2016-03-06: 1000 mg via INTRAVENOUS
  Filled 2016-03-06: qty 1100

## 2016-03-06 MED ORDER — INSULIN ASPART 100 UNIT/ML ~~LOC~~ SOLN
0.0000 [IU] | Freq: Three times a day (TID) | SUBCUTANEOUS | Status: DC
  Administered 2016-03-06: 3 [IU] via SUBCUTANEOUS
  Administered 2016-03-07: 2 [IU] via SUBCUTANEOUS

## 2016-03-06 MED ORDER — FENTANYL CITRATE (PF) 100 MCG/2ML IJ SOLN
INTRAMUSCULAR | Status: AC
  Filled 2016-03-06: qty 2

## 2016-03-06 MED ORDER — ASPIRIN 81 MG PO CHEW
81.0000 mg | CHEWABLE_TABLET | Freq: Two times a day (BID) | ORAL | Status: DC
  Administered 2016-03-06 – 2016-03-07 (×2): 81 mg via ORAL
  Filled 2016-03-06 (×2): qty 1

## 2016-03-06 MED ORDER — ONDANSETRON HCL 4 MG/2ML IJ SOLN: 4.0000 mg | Freq: Four times a day (QID) | INTRAMUSCULAR | Status: DC | PRN

## 2016-03-06 MED ORDER — SODIUM CHLORIDE 0.9 % IR SOLN
Status: DC | PRN
  Administered 2016-03-06: 1000 mL

## 2016-03-06 MED ORDER — CEFAZOLIN SODIUM-DEXTROSE 2-4 GM/100ML-% IV SOLN
2.0000 g | INTRAVENOUS | Status: AC
  Administered 2016-03-06: 2 g via INTRAVENOUS

## 2016-03-06 MED ORDER — SODIUM CHLORIDE 0.9 % IV SOLN
100.0000 mL/h | INTRAVENOUS | Status: DC
  Administered 2016-03-06 – 2016-03-07 (×2): 100 mL/h via INTRAVENOUS
  Filled 2016-03-06 (×6): qty 1000

## 2016-03-06 MED ORDER — METOPROLOL TARTRATE 50 MG PO TABS
50.0000 mg | ORAL_TABLET | Freq: Two times a day (BID) | ORAL | Status: DC
  Administered 2016-03-06 – 2016-03-07 (×2): 50 mg via ORAL
  Filled 2016-03-06 (×2): qty 1

## 2016-03-06 MED ORDER — FERROUS SULFATE 325 (65 FE) MG PO TABS
325.0000 mg | ORAL_TABLET | Freq: Three times a day (TID) | ORAL | Status: DC
  Administered 2016-03-06 – 2016-03-07 (×3): 325 mg via ORAL
  Filled 2016-03-06 (×3): qty 1

## 2016-03-06 MED ORDER — ONDANSETRON HCL 4 MG/2ML IJ SOLN
INTRAMUSCULAR | Status: DC | PRN
  Administered 2016-03-06: 4 mg via INTRAVENOUS

## 2016-03-06 MED ORDER — MAGNESIUM CITRATE PO SOLN: 1.0000 | Freq: Once | ORAL | Status: DC | PRN

## 2016-03-06 MED ORDER — ALUM & MAG HYDROXIDE-SIMETH 200-200-20 MG/5ML PO SUSP
30.0000 mL | ORAL | Status: DC | PRN
  Administered 2016-03-07: 30 mL via ORAL
  Filled 2016-03-06: qty 30

## 2016-03-06 MED ORDER — HYDROMORPHONE HCL 1 MG/ML IJ SOLN
0.2500 mg | INTRAMUSCULAR | Status: DC | PRN
  Administered 2016-03-06: 0.5 mg via INTRAVENOUS

## 2016-03-06 MED ORDER — PROPOFOL 10 MG/ML IV BOLUS
INTRAVENOUS | Status: AC
  Filled 2016-03-06: qty 80

## 2016-03-06 MED ORDER — PHENOL 1.4 % MT LIQD: 1.0000 | OROMUCOSAL | Status: DC | PRN

## 2016-03-06 MED ORDER — EPHEDRINE 5 MG/ML INJ
INTRAVENOUS | Status: AC
  Filled 2016-03-06: qty 10

## 2016-03-06 MED ORDER — ASPIRIN 81 MG PO CHEW: 81.0000 mg | CHEWABLE_TABLET | Freq: Two times a day (BID) | ORAL | 0 refills | Status: AC

## 2016-03-06 MED ORDER — ONDANSETRON HCL 4 MG/2ML IJ SOLN
INTRAMUSCULAR | Status: AC
  Filled 2016-03-06: qty 2

## 2016-03-06 MED ORDER — HYDROCODONE-ACETAMINOPHEN 7.5-325 MG/15ML PO SOLN: 10.0000 mL | ORAL | Status: DC

## 2016-03-06 MED ORDER — FERROUS SULFATE 325 (65 FE) MG PO TABS: 325.0000 mg | ORAL_TABLET | Freq: Three times a day (TID) | ORAL | Status: DC

## 2016-03-06 MED ORDER — DOCUSATE SODIUM 100 MG PO CAPS: 100.0000 mg | ORAL_CAPSULE | Freq: Two times a day (BID) | ORAL | 0 refills | Status: DC

## 2016-03-06 MED ORDER — POLYETHYLENE GLYCOL 3350 17 G PO PACK: 17.0000 g | PACK | Freq: Two times a day (BID) | ORAL | 0 refills | Status: DC

## 2016-03-06 MED ORDER — LACTATED RINGERS IV SOLN
INTRAVENOUS | Status: DC | PRN
  Administered 2016-03-06 (×3): via INTRAVENOUS

## 2016-03-06 MED ORDER — MIDAZOLAM HCL 2 MG/2ML IJ SOLN
INTRAMUSCULAR | Status: AC
  Filled 2016-03-06: qty 2

## 2016-03-06 MED ORDER — METOCLOPRAMIDE HCL 5 MG/ML IJ SOLN: 5.0000 mg | Freq: Three times a day (TID) | INTRAMUSCULAR | Status: DC | PRN

## 2016-03-06 MED ORDER — METFORMIN HCL ER 500 MG PO TB24
1000.0000 mg | ORAL_TABLET | Freq: Every day | ORAL | Status: DC
  Administered 2016-03-06: 1000 mg via ORAL
  Filled 2016-03-06: qty 2

## 2016-03-06 MED ORDER — PROPOFOL 500 MG/50ML IV EMUL
INTRAVENOUS | Status: DC | PRN
  Administered 2016-03-06: 75 ug/kg/min via INTRAVENOUS

## 2016-03-06 MED ORDER — ACETAMINOPHEN 500 MG PO TABS: 1000.0000 mg | ORAL_TABLET | Freq: Three times a day (TID) | ORAL | 0 refills | Status: DC

## 2016-03-06 MED ORDER — HYDROMORPHONE HCL 1 MG/ML IJ SOLN
0.5000 mg | INTRAMUSCULAR | Status: DC | PRN
  Administered 2016-03-06: 1 mg via INTRAVENOUS
  Filled 2016-03-06 (×2): qty 1

## 2016-03-06 MED ORDER — DEXAMETHASONE SODIUM PHOSPHATE 10 MG/ML IJ SOLN
10.0000 mg | Freq: Once | INTRAMUSCULAR | Status: AC
  Administered 2016-03-06: 10 mg via INTRAVENOUS

## 2016-03-06 MED ORDER — FENTANYL CITRATE (PF) 100 MCG/2ML IJ SOLN
INTRAMUSCULAR | Status: DC | PRN
Start: 2016-03-06 — End: 2016-03-06
  Administered 2016-03-06: 75 ug via INTRAVENOUS
  Administered 2016-03-06: 25 ug via INTRAVENOUS

## 2016-03-06 MED ORDER — KETAMINE HCL 10 MG/ML IJ SOLN
INTRAMUSCULAR | Status: DC | PRN
  Administered 2016-03-06 (×3): 10 mg via INTRAVENOUS

## 2016-03-06 MED ORDER — NIACIN 250 MG PO TABS
250.0000 mg | ORAL_TABLET | Freq: Every day | ORAL | Status: DC
  Administered 2016-03-07: 250 mg via ORAL
  Filled 2016-03-06: qty 1

## 2016-03-06 MED ORDER — POLYETHYLENE GLYCOL 3350 17 G PO PACK
17.0000 g | PACK | Freq: Two times a day (BID) | ORAL | Status: DC
  Administered 2016-03-07: 17 g via ORAL
  Filled 2016-03-06 (×2): qty 1

## 2016-03-06 MED ORDER — ASPIRIN 81 MG PO CHEW: 81.0000 mg | CHEWABLE_TABLET | Freq: Two times a day (BID) | ORAL | 0 refills | Status: DC

## 2016-03-06 MED ORDER — BISACODYL 10 MG RE SUPP: 10.0000 mg | Freq: Every day | RECTAL | Status: DC | PRN

## 2016-03-06 MED ORDER — BUPIVACAINE IN DEXTROSE 0.75-8.25 % IT SOLN
INTRATHECAL | Status: DC | PRN
  Administered 2016-03-06: 2 mL via INTRATHECAL

## 2016-03-06 MED ORDER — EPHEDRINE SULFATE-NACL 50-0.9 MG/10ML-% IV SOSY
PREFILLED_SYRINGE | INTRAVENOUS | Status: DC | PRN
  Administered 2016-03-06 (×7): 5 mg via INTRAVENOUS

## 2016-03-06 MED ORDER — DEXAMETHASONE SODIUM PHOSPHATE 10 MG/ML IJ SOLN
INTRAMUSCULAR | Status: AC
  Filled 2016-03-06: qty 2

## 2016-03-06 MED ORDER — TRAMADOL HCL 50 MG PO TABS
50.0000 mg | ORAL_TABLET | Freq: Four times a day (QID) | ORAL | Status: DC
  Administered 2016-03-06: 100 mg via ORAL
  Filled 2016-03-06: qty 2

## 2016-03-06 MED ORDER — CHLORHEXIDINE GLUCONATE 4 % EX LIQD: 60.0000 mL | Freq: Once | CUTANEOUS | Status: DC

## 2016-03-06 MED ORDER — TRIAMTERENE-HCTZ 75-50 MG PO TABS
0.5000 | ORAL_TABLET | Freq: Every day | ORAL | Status: DC
  Filled 2016-03-06: qty 0.5

## 2016-03-06 SURGICAL SUPPLY — 35 items
BAG DECANTER FOR FLEXI CONT (MISCELLANEOUS) IMPLANT
BAG ZIPLOCK 12X15 (MISCELLANEOUS) IMPLANT
BLADE SAG 18X100X1.27 (BLADE) ×2 IMPLANT
CAPT HIP TOTAL 2 ×2 IMPLANT
CLOTH BEACON ORANGE TIMEOUT ST (SAFETY) ×2 IMPLANT
COVER PERINEAL POST (MISCELLANEOUS) ×2 IMPLANT
DERMABOND ADVANCED (GAUZE/BANDAGES/DRESSINGS) ×1
DERMABOND ADVANCED .7 DNX12 (GAUZE/BANDAGES/DRESSINGS) ×1 IMPLANT
DRAPE STERI IOBAN 125X83 (DRAPES) ×2 IMPLANT
DRAPE U-SHAPE 47X51 STRL (DRAPES) ×4 IMPLANT
DRESSING AQUACEL AG SP 3.5X10 (GAUZE/BANDAGES/DRESSINGS) ×1 IMPLANT
DRSG AQUACEL AG SP 3.5X10 (GAUZE/BANDAGES/DRESSINGS) ×2
DURAPREP 26ML APPLICATOR (WOUND CARE) ×2 IMPLANT
ELECT REM PT RETURN 15FT ADLT (MISCELLANEOUS) IMPLANT
ELECT REM PT RETURN 9FT ADLT (ELECTROSURGICAL) ×2
ELECTRODE REM PT RTRN 9FT ADLT (ELECTROSURGICAL) ×1 IMPLANT
GLOVE BIOGEL M STRL SZ7.5 (GLOVE) ×4 IMPLANT
GLOVE BIOGEL PI IND STRL 7.5 (GLOVE) ×5 IMPLANT
GLOVE BIOGEL PI IND STRL 8.5 (GLOVE) IMPLANT
GLOVE BIOGEL PI INDICATOR 7.5 (GLOVE) ×5
GLOVE BIOGEL PI INDICATOR 8.5 (GLOVE)
GLOVE ECLIPSE 8.0 STRL XLNG CF (GLOVE) IMPLANT
GLOVE ORTHO TXT STRL SZ7.5 (GLOVE) ×2 IMPLANT
GOWN STRL REUS W/TWL LRG LVL3 (GOWN DISPOSABLE) ×2 IMPLANT
GOWN STRL REUS W/TWL XL LVL3 (GOWN DISPOSABLE) ×6 IMPLANT
HOLDER FOLEY CATH W/STRAP (MISCELLANEOUS) ×2 IMPLANT
PACK ANTERIOR HIP CUSTOM (KITS) ×2 IMPLANT
SUT MNCRL AB 4-0 PS2 18 (SUTURE) ×2 IMPLANT
SUT VIC AB 1 CT1 36 (SUTURE) ×6 IMPLANT
SUT VIC AB 2-0 CT1 27 (SUTURE) ×2
SUT VIC AB 2-0 CT1 TAPERPNT 27 (SUTURE) ×2 IMPLANT
SUT VLOC 180 0 24IN GS25 (SUTURE) ×2 IMPLANT
TRAY FOLEY CATH 14FR (SET/KITS/TRAYS/PACK) ×2 IMPLANT
WATER STERILE IRR 1500ML POUR (IV SOLUTION) ×4 IMPLANT
YANKAUER SUCT BULB TIP 10FT TU (MISCELLANEOUS) IMPLANT

## 2016-03-06 NOTE — Interval H&P Note (Signed)
History and Physical Interval Note:  03/06/2016 7:04 AM  Brandi Dickson  has presented today for surgery, with the diagnosis of Left hip osteoarthritis  The various methods of treatment have been discussed with the patient and family. After consideration of risks, benefits and other options for treatment, the patient has consented to  Procedure(s): LEFT TOTAL HIP ARTHROPLASTY ANTERIOR APPROACH (Left) as a surgical intervention .  The patient's history has been reviewed, patient examined, no change in status, stable for surgery.  I have reviewed the patient's chart and labs.  Questions were answered to the patient's satisfaction.     Mauri Pole

## 2016-03-06 NOTE — Op Note (Signed)
NAME:  Brandi Dickson NO.: [id number]      MEDICAL RECORD NO.: [id number]      FACILITY:  [facility]      PHYSICIAN:  [name] D  DATE OF BIRTH:  [birth year]     DATE OF PROCEDURE:  03/06/2016                                 OPERATIVE REPORT         PREOPERATIVE DIAGNOSIS: Left  hip osteoarthritis.      POSTOPERATIVE DIAGNOSIS:  Left hip osteoarthritis.      PROCEDURE:  Left total hip replacement through an anterior approach   utilizing DePuy THR system, component size 6m pinnacle cup, a size 36+4 neutral   Altrex liner, a size 4 Hi Tri Lock stem with a 36+1.5 delta ceramic   ball.      SURGEON:  MPietro Cassis OAlvan Dame M.D.      ASSISTANT:  BNehemiah Massed PA-C     ANESTHESIA:  Spinal.      SPECIMENS:  None.      COMPLICATIONS:  None.      BLOOD LOSS:  250 cc     DRAINS:  None.      INDICATION OF THE PROCEDURE:  RNAHJAE HOEGis a 56y.o. female who had   presented to office for evaluation of left hip pain.  Radiographs revealed   progressive degenerative changes with bone-on-bone   articulation to the  hip joint.  The patient had painful limited range of   motion significantly affecting their overall quality of life.  The patient was failing to    respond to conservative measures, and at this point was ready   to proceed with more definitive measures.  The patient has noted progressive   degenerative changes in his hip, progressive problems and dysfunction   with regarding the hip prior to surgery.  Consent was obtained for   benefit of pain relief.  Specific risk of infection, DVT, component   failure, dislocation, need for revision surgery, as well discussion of   the anterior versus posterior approach were reviewed.  Consent was   obtained for benefit of anterior pain relief through an anterior   approach.      PROCEDURE IN DETAIL:  The patient was brought to operative theater.   Once adequate anesthesia,  preoperative antibiotics, 2gm of Ancef, 1 gm of Tranexamic Acid, and 10 mg of Decadron administered.   The patient was positioned supine on the OSI Hanna table.  Once adequate   padding of boney process was carried out, we had predraped out the hip, and  used fluoroscopy to confirm orientation of the pelvis and position.      The left hip was then prepped and draped from proximal iliac crest to   mid thigh with shower curtain technique.      Time-out was performed identifying the patient, planned procedure, and   extremity.     An incision was then made 2 cm distal and lateral to the   anterior superior iliac spine extending over the orientation of the   tensor fascia lata muscle and sharp dissection was carried down to the   fascia of the muscle and protractor placed in the soft tissues.      The fascia was  then incised.  The muscle belly was identified and swept   laterally and retractor placed along the superior neck.  Following   cauterization of the circumflex vessels and removing some pericapsular   fat, a second cobra retractor was placed on the inferior neck.  A third   retractor was placed on the anterior acetabulum after elevating the   anterior rectus.  A L-capsulotomy was along the line of the   superior neck to the trochanteric fossa, then extended proximally and   distally.  Tag sutures were placed and the retractors were then placed   intracapsular.  We then identified the trochanteric fossa and   orientation of my neck cut, confirmed this radiographically   and then made a neck osteotomy with the femur on traction.  The femoral   head was removed without difficulty or complication.  Traction was let   off and retractors were placed posterior and anterior around the   acetabulum.      The labrum and foveal tissue were debrided.  I began reaming with a 37m   reamer and reamed up to 521mreamer with good bony bed preparation and a 5235m cup was chosen.  The final 49m40minnacle cup was then impacted under fluoroscopy  to confirm the depth of penetration and orientation with respect to   abduction.  A screw was placed followed by the hole eliminator.  The final   36+4 neutral Altrex liner was impacted with good visualized rim fit.  The cup was positioned anatomically within the acetabular portion of the pelvis.      At this point, the femur was rolled at 80 degrees.  Further capsule was   released off the inferior aspect of the femoral neck.  I then   released the superior capsule proximally.  The hook was placed laterally   along the femur and elevated manually and held in position with the bed   hook.  The leg was then extended and adducted with the leg rolled to 100   degrees of external rotation.  Once the proximal femur was fully   exposed, I used a box osteotome to set orientation.  I then began   broaching with the starting chili pepper broach and passed this by hand and then broached up to 4.  With the 4 broach in place I chose a high offset neck and did  trial reductions.  The offset was appropriate, leg lengths   appeared to be equal best matched with the +1.5 head ball confirmed radiographically.   Given these findings, I went ahead and dislocated the hip, repositioned all   retractors and positioned the right hip in the extended and abducted position.  The final 4 Hi Tri Lock stem was   chosen and it was impacted down to the level of neck cut.  Based on this   and the trial reduction, a 36+1.5 delta ceramic ball was chosen and   impacted onto a clean and dry trunnion, and the hip was reduced.  The   hip had been irrigated throughout the case again at this point.  I did   reapproximate the superior capsular leaflet to the anterior leaflet   using #1 Vicryl.  The fascia of the   tensor fascia lata muscle was then reapproximated using #1 Vicryl and #0 Stratafix sutures.  The   remaining wound was closed with 2-0 Vicryl and running 4-0 Monocryl.    The hip was cleaned, dried, and  dressed sterilely using Dermabond and   Aquacel dressing.  She was then brought   to recovery room in stable condition tolerating the procedure well.    Nehemiah Massed, PA-C was present for the entirety of the case involved from   preoperative positioning, perioperative retractor management, general   facilitation of the case, as well as primary wound closure as assistant.            Pietro Cassis Alvan Dame, M.D.        03/06/2016 9:49 AM

## 2016-03-06 NOTE — Anesthesia Procedure Notes (Signed)
Spinal  Patient location during procedure: OR Start time: 03/06/2016 8:27 AM End time: 03/06/2016 8:31 AM Staffing Anesthesiologist: Roderic Palau Performed: anesthesiologist  Preanesthetic Checklist Completed: patient identified, surgical consent, pre-op evaluation, timeout performed, IV checked, risks and benefits discussed and monitors and equipment checked Spinal Block Patient position: sitting Prep: DuraPrep Patient monitoring: cardiac monitor, continuous pulse ox and blood pressure Approach: midline Location: L3-4 Injection technique: single-shot Needle Needle type: Pencan  Needle gauge: 24 G Needle length: 9 cm Assessment Sensory level: T8 Additional Notes Functioning IV was confirmed and monitors were applied. Sterile prep and drape, including hand hygiene and sterile gloves were used. The patient was positioned and the spine was prepped. The skin was anesthetized with lidocaine.  Free flow of clear CSF was obtained prior to injecting local anesthetic into the CSF.  The spinal needle aspirated freely following injection.  The needle was carefully withdrawn.  The patient tolerated the procedure well.

## 2016-03-06 NOTE — Anesthesia Postprocedure Evaluation (Signed)
Anesthesia Post Note  Patient: Brandi Dickson  Procedure(s) Performed: Procedure(s) (LRB): LEFT TOTAL HIP ARTHROPLASTY ANTERIOR APPROACH (Left)  Patient location during evaluation: PACU Anesthesia Type: Spinal Level of consciousness: awake and alert Pain management: pain level controlled Vital Signs Assessment: post-procedure vital signs reviewed and stable Respiratory status: spontaneous breathing and respiratory function stable Cardiovascular status: blood pressure returned to baseline and stable Postop Assessment: spinal receding Anesthetic complications: no       Last Vitals:  Vitals:   03/06/16 1200 03/06/16 1213  BP: 121/68 116/66  Pulse: (!) 52 67  Resp: 14 18  Temp:      Last Pain:  Vitals:   03/06/16 1213  TempSrc:   PainSc: 3     LLE Motor Response: Purposeful movement (03/06/16 1213) LLE Sensation: Decreased (03/06/16 1213) RLE Motor Response: Purposeful movement (03/06/16 1213) RLE Sensation: Decreased (03/06/16 1213) L Sensory Level: L3-Anterior knee, lower leg (03/06/16 1213) R Sensory Level: L3-Anterior knee, lower leg (03/06/16 1213)  Artis Buechele,W. EDMOND

## 2016-03-06 NOTE — Transfer of Care (Signed)
Immediate Anesthesia Transfer of Care Note  Patient: Brandi Dickson  Procedure(s) Performed: Procedure(s): LEFT TOTAL HIP ARTHROPLASTY ANTERIOR APPROACH (Left)  Patient Location: PACU  Anesthesia Type:Spinal  Level of Consciousness:  sedated, patient cooperative and responds to stimulation  Airway & Oxygen Therapy:Patient Spontanous Breathing and Patient connected to face mask oxgen  Post-op Assessment:  Report given to PACU RN and Post -op Vital signs reviewed and stable  Post vital signs:  Reviewed and stable  Last Vitals:  Vitals:   03/06/16 0618  BP: 137/77  Pulse: 73  Resp: 16  Temp: 0000000 C    Complications: No apparent anesthesia complications

## 2016-03-06 NOTE — Progress Notes (Signed)
PT Cancellation Note  Patient Details Name: Brandi Dickson MRN: [id number] DOB: 1960-03-09   Cancelled Treatment:     remains  Too numb at this time. Claretha Cooper 03/06/2016, 4:07 PM

## 2016-03-06 NOTE — Discharge Instructions (Signed)

## 2016-03-07 LAB — BASIC METABOLIC PANEL
Anion gap: 6 (ref 5–15)
BUN: 12 mg/dL (ref 6–20)
CHLORIDE: 105 mmol/L (ref 101–111)
CO2: 27 mmol/L (ref 22–32)
CREATININE: 0.76 mg/dL (ref 0.44–1.00)
Calcium: 8.6 mg/dL — ABNORMAL LOW (ref 8.9–10.3)
GFR calc Af Amer: >60 mL/min (ref >60)
GFR calc non Af Amer: >60 mL/min (ref >60)
GLUCOSE: 107 mg/dL — AB (ref 65–99)
Potassium: 4.7 mmol/L (ref 3.5–5.1)
SODIUM: 138 mmol/L (ref 135–145)

## 2016-03-07 LAB — CBC
HCT: 32.7 % — ABNORMAL LOW (ref 36.0–46.0)
HEMOGLOBIN: 10.4 g/dL — AB (ref 12.0–15.0)
MCH: 26.8 pg (ref 26.0–34.0)
MCHC: 31.8 g/dL (ref 30.0–36.0)
MCV: 84.3 fL (ref 78.0–100.0)
Platelets: 218 10*3/uL (ref 150–400)
RBC: 3.88 MIL/uL (ref 3.87–5.11)
RDW: 14.9 % (ref 11.5–15.5)
WBC: 9.7 10*3/uL (ref 4.0–10.5)

## 2016-03-07 LAB — GLUCOSE, CAPILLARY
GLUCOSE-CAPILLARY: 104 mg/dL — AB (ref 65–99)
GLUCOSE-CAPILLARY: 139 mg/dL — AB (ref 65–99)

## 2016-03-07 NOTE — Progress Notes (Signed)
Physical Therapy Treatment Patient Details Name: Brandi Dickson MRN: [id number] DOB: 1960/03/11 Today's Date: 03/07/2016    History of Present Illness Pt s/p L THR and with hx of R posterior THR (10), DM and neuropathy    PT Comments    Pt progressing well with mobility and eager for return home.  Reviewed stairs, car transfers and home therex program with progression with pt and spouse.    Follow Up Recommendations  No PT follow up     Equipment Recommendations  None recommended by PT    Recommendations for Other Services       Precautions / Restrictions Precautions Precautions: Fall Restrictions Weight Bearing Restrictions: No    Mobility  Bed Mobility Overal bed mobility: Needs Assistance Bed Mobility: Supine to Sit;Sit to Supine     Supine to sit: Min guard Sit to supine: Min assist   General bed mobility comments: cues for sequence and use of R LE to self assist.  Physical assist to bring L LE into bed  Transfers Overall transfer level: Needs assistance Equipment used: Rolling walker (2 wheeled) Transfers: Sit to/from Stand Sit to Stand: Min guard;Supervision         General transfer comment: cues for LE management and use of UEs to self assist  Ambulation/Gait Ambulation/Gait assistance: Min guard;Supervision Ambulation Distance (Feet): 300 Feet Assistive device: Rolling walker (2 wheeled) Gait Pattern/deviations: Step-to pattern;Step-through pattern;Decreased step length - right;Decreased step length - left;Shuffle;Trunk flexed Gait velocity: decr Gait velocity interpretation: Below normal speed for age/gender General Gait Details: cues for posture, position from RW and initial sequence   Stairs Stairs: Yes   Stair Management: One rail Right;Step to pattern;Forwards;With cane Number of Stairs: 4 General stair comments: cues for sequence and foot/cane placement; spouse present and assisting  Wheelchair Mobility    Modified Rankin (Stroke  Patients Only)       Balance Overall balance assessment: No apparent balance deficits (not formally assessed)                                  Cognition Arousal/Alertness: Awake/alert Behavior During Therapy: WFL for tasks assessed/performed Overall Cognitive Status: Within Functional Limits for tasks assessed                      Exercises Total Joint Exercises Ankle Circles/Pumps: AROM;Both;20 reps;Supine Quad Sets: AROM;Both;10 reps;Supine Heel Slides: AAROM;Left;Supine;15 reps Hip ABduction/ADduction: AAROM;Left;Supine;10 reps Long Arc Quad: 10 reps;AROM;Left;Seated    General Comments        Pertinent Vitals/Pain Pain Assessment: 0-10 Pain Score: 4  Pain Location: L hip Pain Descriptors / Indicators: Aching;Sore Pain Intervention(s): Limited activity within patient's tolerance;Monitored during session;Premedicated before session;Ice applied    Home Living                      Prior Function            PT Goals (current goals can now be found in the care plan section) Acute Rehab PT Goals Patient Stated Goal: Regain IND PT Goal Formulation: With patient Time For Goal Achievement: 03/10/16 Potential to Achieve Goals: Good Progress towards PT goals: Progressing toward goals    Frequency    7X/week      PT Plan Current plan remains appropriate    Co-evaluation             End of Session Equipment Utilized During Treatment:  Gait belt Activity Tolerance: Patient tolerated treatment well Patient left: in chair;with call bell/phone within reach;with family/visitor present;with chair alarm set     Time: 1400-1438 PT Time Calculation (min) (ACUTE ONLY): 38 min  Charges:  $Gait Training: 8-22 mins $Therapeutic Exercise: 8-22 mins $Therapeutic Activity: 8-22 mins                    G Codes:      Jymir Dunaj 04-05-16, 5:21 PM

## 2016-03-07 NOTE — Evaluation (Signed)
Physical Therapy Evaluation Patient Details Name: Brandi Dickson MRN: [id number] DOB: 1960/06/11 Today's Date: 03/07/2016   History of Present Illness  Pt s/p L THR and with hx of R posterior THR (10), DM and neuropathy  Clinical Impression  Pt s/p L THR and presents with decreased L LE strength/ROM and post op pain limiting functional mobility.  Pt plans dc home with family assist.    Follow Up Recommendations No PT follow up (Per physician)    Equipment Recommendations  None recommended by PT    Recommendations for Other Services       Precautions / Restrictions Precautions Precautions: Fall Restrictions Weight Bearing Restrictions: No      Mobility  Bed Mobility               General bed mobility comments: OOB with nursing  Transfers Overall transfer level: Needs assistance Equipment used: Rolling walker (2 wheeled) Transfers: Sit to/from Stand Sit to Stand: Min assist         General transfer comment: cues for LE management and use of UEs to self assist  Ambulation/Gait Ambulation/Gait assistance: Min assist;Min guard Ambulation Distance (Feet): 200 Feet Assistive device: Rolling walker (2 wheeled) Gait Pattern/deviations: Step-to pattern;Step-through pattern;Decreased step length - right;Decreased step length - left;Shuffle;Trunk flexed Gait velocity: decr Gait velocity interpretation: Below normal speed for age/gender General Gait Details: cues for posture, position from RW and initial sequence  Stairs            Wheelchair Mobility    Modified Rankin (Stroke Patients Only)       Balance Overall balance assessment: No apparent balance deficits (not formally assessed)                                           Pertinent Vitals/Pain Pain Assessment: 0-10 Pain Score: 4  Pain Location: L hip Pain Descriptors / Indicators: Aching;Sore Pain Intervention(s): Limited activity within patient's tolerance;Monitored during  session;Premedicated before session;Ice applied    Home Living Family/patient expects to be discharged to:: Private residence Living Arrangements: Spouse/significant other Available Help at Discharge: Family Type of Home: House Home Access: Stairs to enter Entrance Stairs-Rails: Right Entrance Stairs-Number of Steps: 3 Home Layout: Able to live on main level with bedroom/bathroom Home Equipment: Walker - 2 wheels;Cane - single point;Crutches;Shower seat;Wheelchair - manual      Prior Function Level of Independence: Independent               Hand Dominance        Extremity/Trunk Assessment   Upper Extremity Assessment Upper Extremity Assessment: Overall WFL for tasks assessed    Lower Extremity Assessment Lower Extremity Assessment: LLE deficits/detail LLE Deficits / Details: Strength at hip 2+/5 with AAROM at hip to 80 flex and 20 abd    Cervical / Trunk Assessment Cervical / Trunk Assessment: Normal  Communication   Communication: No difficulties  Cognition Arousal/Alertness: Awake/alert Behavior During Therapy: WFL for tasks assessed/performed Overall Cognitive Status: Within Functional Limits for tasks assessed                      General Comments      Exercises Total Joint Exercises Ankle Circles/Pumps: AROM;Both;20 reps;Supine Quad Sets: AROM;Both;10 reps;Supine Heel Slides: AAROM;Left;20 reps;Supine Hip ABduction/ADduction: AAROM;Left;15 reps;Supine   Assessment/Plan    PT Assessment Patient needs continued PT services  PT Problem List Decreased  strength;Decreased range of motion;Decreased activity tolerance;Decreased mobility;Decreased knowledge of use of DME;Obesity;Pain          PT Treatment Interventions DME instruction;Gait training;Functional mobility training;Stair training;Therapeutic activities;Therapeutic exercise;Patient/family education    PT Goals (Current goals can be found in the Care Plan section)  Acute Rehab PT  Goals Patient Stated Goal: Regain IND PT Goal Formulation: With patient Time For Goal Achievement: 03/10/16 Potential to Achieve Goals: Good    Frequency 7X/week   Barriers to discharge        Co-evaluation               End of Session Equipment Utilized During Treatment: Gait belt Activity Tolerance: Patient tolerated treatment well Patient left: in chair;with call bell/phone within reach;with family/visitor present;with chair alarm set Nurse Communication: Mobility status         Time: 1040-1110 PT Time Calculation (min) (ACUTE ONLY): 30 min   Charges:   PT Evaluation $PT Eval Low Complexity: 1 Procedure PT Treatments $Therapeutic Exercise: 8-22 mins   PT G Codes:        Oleda Borski March 24, 2016, 12:36 PM

## 2016-03-07 NOTE — Progress Notes (Signed)
OT Cancellation Note  Patient Details Name: Brandi Dickson MRN: [id number] DOB: March 11, 1960   Cancelled Treatment:    Reason Eval/Treat Not Completed: PT screened, no needs identified, will sign off  Natalie Leclaire 03/07/2016, 11:12 AM  Lesle Chris, OTR/L [phone] 03/07/2016

## 2016-03-07 NOTE — Care Management Note (Signed)
Case Management Note  Patient Details  Name: Brandi Dickson MRN: [id number] Date of Birth: 1960/07/19  Subjective/Objective:                  LEFT TOTAL HIP ARTHROPLASTY ANTERIOR APPROACH Action/Plan: Discharge planning Expected Discharge Date:  03/07/16               Expected Discharge Plan:  Home/Self Care  In-House Referral:     Discharge planning Services  CM Consult  Post Acute Care Choice:  NA Choice offered to:  Patient  DME Arranged:  N/A DME Agency:  NA  HH Arranged:  NA HH Agency:  NA  Status of Service:  Completed, signed off  If discussed at [place] of Stay Meetings, dates discussed:    Additional Comments: CM met with pt to confirm plan is no Home Health services; pt confirms. Pt also states she has rolling walker, 3n1, shower stool at home. NO other CM needs were communicated. Dellie Catholic, RN 03/07/2016, 11:29 AM

## 2016-03-09 NOTE — Progress Notes (Signed)
     Subjective: 1 Day Post-Op Procedure(s) (LRB): LEFT TOTAL HIP ARTHROPLASTY ANTERIOR APPROACH (Left)   Patient reports pain as mild, pain controlled. No events throughout the night.  Ready to be discharged home.  Objective:   VITALS:    03/07/16 0950  BP: 130/63  Pulse: 65  Resp: 16  Temp: 98 F (36.7 C)    Dorsiflexion/Plantar flexion intact Incision: dressing C/D/I No cellulitis present Compartment soft  LABS  Recent Labs  03/07/16 0427  HGB 10.4*  HCT 32.7*  WBC 9.7  PLT 218     Recent Labs  03/07/16 0427  NA 138  K 4.7  BUN 12  CREATININE 0.76  GLUCOSE 107*     Assessment/Plan: 1 Day Post-Op Procedure(s) (LRB): LEFT TOTAL HIP ARTHROPLASTY ANTERIOR APPROACH (Left) Up with therapy Discharge home Follow up in 2 weeks at [facility]. Follow up with OLIN,Zamyia Gowell D in 2 weeks.  Contact information:  [facility] [address], [address] [place] Narissa Beaufort   PAC  03/09/2016, 11:44 AM

## 2016-03-09 NOTE — Discharge Summary (Signed)
Physician Discharge Summary  Patient ID: Brandi Dickson MRN: [id number] DOB/AGE: 1960-11-21 56 y.o.  Admit date: 03/06/2016 Discharge date: 03/07/2016   Procedures:  Procedure(s) (LRB): LEFT TOTAL HIP ARTHROPLASTY ANTERIOR APPROACH (Left)  Attending Physician:  Dr. [name]   Admission Diagnoses:   Left hip primary OA / pain  Discharge Diagnoses:  Principal Problem:   S/P left THA, AA  Past Medical History:  Diagnosis Date  . Arthritis   . Asthma    triggered with [name] perfumes and cigarette smoke  . Colitis   . Diabetes mellitus without complication ([place])   . Hypertension   . Neuropathy (HCC)     HPI:    Brandi Dickson, 56 y.o. female, has a history of pain and functional disability in the left hip(s) due to arthritis and patient has failed non-surgical conservative treatments for greater than 12 weeks to include NSAID's and/or analgesics, use of assistive devices and activity modification.  Onset of symptoms was gradual starting 1+ years ago with gradually worsening course since that time.The patient noted prior procedures of the hip to include arthroplasty on the right hip in 2010.  Patient currently rates pain in the left hip at 10 out of 10 with activity. Patient has night pain, worsening of pain with activity and weight bearing, trendelenberg gait, pain that interfers with activities of daily living and pain with passive range of motion. Patient has evidence of periarticular osteophytes and joint space narrowing by imaging studies. This condition presents safety issues increasing the risk of falls.  There is no current active infection.   Risks, benefits and expectations were discussed with the patient.  Risks including but not limited to the risk of anesthesia, blood clots, nerve damage, blood vessel damage, failure of the prosthesis, infection and up to and including death.  Patient understand the risks, benefits and expectations and wishes to proceed with  surgery.   PCP: Orpah Melter, MD   Discharged Condition: good  Hospital Course:  Patient underwent the above stated procedure on 03/06/2016. Patient tolerated the procedure well and brought to the recovery room in good condition and subsequently to the floor.  POD #1 BP: 130/63 ; Pulse: 65 ; Temp: 98 F (36.7 C) ; Resp: 16 Patient reports pain as mild, pain controlled. No events throughout the night.  Ready to be discharged home. Dorsiflexion/plantar flexion intact, incision: dressing C/D/I, no cellulitis present and compartment soft.   LABS  Basename    HGB     10.4  HCT     32.7    Discharge Exam: General appearance: alert, cooperative and no distress Extremities: Homans sign is negative, no sign of DVT, no edema, redness or tenderness in the calves or thighs and no ulcers, gangrene or trophic changes  Disposition: Home with follow up in 2 weeks   Follow-up Information    Mauri Pole, MD. Schedule an appointment as soon as possible for a visit in 2 week(s).   Specialty:  Orthopedic Surgery Contact information: [address] [place] [address] [phone]           Discharge Instructions    Call MD / Call 911    Complete by:  As directed    If you experience chest pain or shortness of breath, CALL 911 and be transported to the hospital emergency room.  If you develope a fever above 101 F, pus (white drainage) or increased drainage or redness at the wound, or calf pain, call your  surgeon's office.   Change dressing    Complete by:  As directed    Maintain surgical dressing until follow up in the clinic. If the edges start to pull up, may reinforce with tape. If the dressing is no longer working, may remove and cover with gauze and tape, but must keep the area dry and clean.  Call with any questions or concerns.   Constipation Prevention    Complete by:  As directed    Drink plenty of fluids.  Prune juice may be helpful.  You may use a stool  softener, such as Colace (over the counter) 100 mg twice a day.  Use MiraLax (over the counter) for constipation as needed.   Diet - low sodium heart healthy    Complete by:  As directed    Discharge instructions    Complete by:  As directed    Maintain surgical dressing until follow up in the clinic. If the edges start to pull up, may reinforce with tape. If the dressing is no longer working, may remove and cover with gauze and tape, but must keep the area dry and clean.  Follow up in 2 weeks at [facility]. Call with any questions or concerns.   Increase activity slowly as tolerated    Complete by:  As directed    Weight bearing as tolerated with assist device (walker, cane, etc) as directed, use it as long as suggested by your surgeon or therapist, typically at least 4-6 weeks.   TED hose    Complete by:  As directed    Use stockings (TED hose) for 2 weeks on both leg(s).  You may remove them at night for sleeping.      Allergies as of 03/07/2016      Reactions   Lisinopril Palpitations   Augmentin [amoxicillin-pot Clavulanate] Other (See Comments)   sts gives her a yeast infection Has patient had a PCN reaction causing immediate rash, facial/tongue/throat swelling, SOB or lightheadedness with hypotension: no Has patient had a PCN reaction causing severe rash involving mucus membranes or skin necrosis: no Has patient had a PCN reaction that required hospitalization no Has patient had a PCN reaction occurring within the last 10 years: unknown If all of the above answers are "NO", then may proceed with Cephalosporin use.      Medication List    STOP taking these medications   aspirin EC 81 MG tablet Replaced by:  aspirin 81 MG chewable tablet   Mesalamine 800 MG Tbec Commonly known as:  ASACOL HD     TAKE these medications   acetaminophen 500 MG tablet Commonly known as:  TYLENOL Take 2 tablets (1,000 mg total) by mouth every 8 (eight) hours.   aspirin 81 MG  chewable tablet Chew 1 tablet (81 mg total) by mouth 2 (two) times daily. Take for 4 weeks, then resume regular dose. Replaces:  aspirin EC 81 MG tablet   Biotin 7500 MCG Tabs Take 7,500 mcg by mouth daily.   dicyclomine 20 MG tablet Commonly known as:  BENTYL Take 1 tablet (20 mg total) by mouth 2 (two) times daily. What changed:  when to take this  reasons to take this   docusate sodium 100 MG capsule Commonly known as:  COLACE Take 1 capsule (100 mg total) by mouth 2 (two) times daily.   ESTROVEN MENOPAUSE RELIEF Caps Take 1 tablet by mouth daily.   ferrous sulfate 325 (65 FE) MG tablet Commonly known as:  FERROUSUL Take  1 tablet (325 mg total) by mouth 3 (three) times daily with meals. What changed:  when to take this   gabapentin 100 MG capsule Commonly known as:  NEURONTIN Take 200 mg by mouth at bedtime.   hydrocortisone 25 MG suppository Commonly known as:  ANUSOL-HC Place 1 suppository (25 mg total) rectally 2 (two) times daily. What changed:  when to take this  reasons to take this   loperamide 1 MG/5ML solution Commonly known as:  IMODIUM Take 3 mg by mouth as needed for diarrhea or loose stools.   metFORMIN 500 MG 24 hr tablet Commonly known as:  GLUCOPHAGE-XR Take 1,000 mg by mouth at bedtime.   methocarbamol 500 MG tablet Commonly known as:  ROBAXIN Take 1 tablet (500 mg total) by mouth every 6 (six) hours as needed for muscle spasms.   metoprolol 50 MG tablet Commonly known as:  LOPRESSOR Take 50 mg by mouth 2 (two) times daily.   NEXIUM 24HR 20 MG Tbec Generic drug:  Esomeprazole Magnesium Take 20 mg by mouth at bedtime as needed (For heartburn or acid reflux.). What changed:  Another medication with the same name was removed. Continue taking this medication, and follow the directions you see here.   niacin 250 MG tablet Take 250 mg by mouth daily before breakfast.   polyethylene glycol packet Commonly known as:  MIRALAX /  GLYCOLAX Take 17 g by mouth 2 (two) times daily.   traMADol 50 MG tablet Commonly known as:  ULTRAM Take 1-2 tablets (50-100 mg total) by mouth every 6 (six) hours as needed. What changed:  how much to take  when to take this  reasons to take this   triamterene-hydrochlorothiazide 75-50 MG tablet Commonly known as:  MAXZIDE Take 0.5 tablets by mouth daily.   VITAMINS FOR THE HAIR Tabs Take 2 tablets by mouth 2 (two) times daily.        Signed: [place] P[place]. Magda Muise   PA-C  03/09/2016, 11:42 AM

## 2016-04-04 DIAGNOSIS — E782 Mixed hyperlipidemia: Secondary | ICD-10-CM | POA: Diagnosis not present

## 2016-04-04 DIAGNOSIS — I10 Essential (primary) hypertension: Secondary | ICD-10-CM | POA: Diagnosis not present

## 2016-04-04 DIAGNOSIS — E1122 Type 2 diabetes mellitus with diabetic chronic kidney disease: Secondary | ICD-10-CM | POA: Diagnosis not present

## 2016-04-04 DIAGNOSIS — N183 Chronic kidney disease, stage 3 (moderate): Secondary | ICD-10-CM | POA: Diagnosis not present

## 2016-04-18 DIAGNOSIS — Z96642 Presence of left artificial hip joint: Secondary | ICD-10-CM | POA: Diagnosis not present

## 2016-04-20 ENCOUNTER — Telehealth: Payer: Self-pay | Admitting: Gastroenterology

## 2016-04-20 NOTE — Telephone Encounter (Signed)
Waiting on patient's return call 

## 2016-04-23 MED ORDER — MESALAMINE 800 MG PO TBEC: 1.0000 | DELAYED_RELEASE_TABLET | Freq: Three times a day (TID) | ORAL | 0 refills | Status: DC

## 2016-04-23 NOTE — Telephone Encounter (Signed)
Prescription for Asacol sent to patient's pharmacy in [place], [place].

## 2016-04-24 DIAGNOSIS — N6321 Unspecified lump in the left breast, upper outer quadrant: Secondary | ICD-10-CM | POA: Diagnosis not present

## 2016-04-25 ENCOUNTER — Other Ambulatory Visit: Payer: Self-pay | Admitting: Radiology

## 2016-04-25 DIAGNOSIS — D0592 Unspecified type of carcinoma in situ of left breast: Secondary | ICD-10-CM | POA: Diagnosis not present

## 2016-04-25 DIAGNOSIS — C50812 Malignant neoplasm of overlapping sites of left female breast: Secondary | ICD-10-CM | POA: Diagnosis not present

## 2016-04-25 DIAGNOSIS — C50412 Malignant neoplasm of upper-outer quadrant of left female breast: Secondary | ICD-10-CM | POA: Diagnosis not present

## 2016-04-26 ENCOUNTER — Telehealth: Payer: Self-pay | Admitting: *Deleted

## 2016-04-26 NOTE — Telephone Encounter (Signed)
Confirmed [facility] for 05/02/16 at 815am .  Instructions and contact information given.

## 2016-04-30 ENCOUNTER — Telehealth: Payer: Self-pay | Admitting: Gastroenterology

## 2016-04-30 MED ORDER — MESALAMINE 800 MG PO TBEC: 1.0000 | DELAYED_RELEASE_TABLET | Freq: Three times a day (TID) | ORAL | 5 refills | Status: DC

## 2016-04-30 NOTE — Telephone Encounter (Signed)
Left a message for patient informing her a sent her prescription to her pharmacy.

## 2016-05-02 ENCOUNTER — Encounter: Payer: Self-pay | Admitting: Hematology

## 2016-05-02 ENCOUNTER — Encounter: Payer: Self-pay | Admitting: *Deleted

## 2016-05-02 ENCOUNTER — Ambulatory Visit (HOSPITAL_BASED_OUTPATIENT_CLINIC_OR_DEPARTMENT_OTHER): Payer: 59 | Admitting: Hematology

## 2016-05-02 ENCOUNTER — Other Ambulatory Visit: Payer: Self-pay | Admitting: Surgery

## 2016-05-02 ENCOUNTER — Ambulatory Visit
Admission: RE | Admit: 2016-05-02 | Discharge: 2016-05-02 | Disposition: A | Discharge status: Home / Self Care | Payer: 59 | Source: Ambulatory Visit | Attending: Radiation Oncology | Admitting: Radiation Oncology

## 2016-05-02 ENCOUNTER — Telehealth (HOSPITAL_COMMUNITY): Payer: Self-pay | Admitting: Vascular Surgery

## 2016-05-02 VITALS — BP 112/65 | HR 75 | Temp 98.2°F | Resp 18 | Ht 64.75 in | Wt 214.2 lb

## 2016-05-02 DIAGNOSIS — C50912 Malignant neoplasm of unspecified site of left female breast: Secondary | ICD-10-CM | POA: Diagnosis not present

## 2016-05-02 DIAGNOSIS — I10 Essential (primary) hypertension: Secondary | ICD-10-CM

## 2016-05-02 DIAGNOSIS — C50412 Malignant neoplasm of upper-outer quadrant of left female breast: Secondary | ICD-10-CM

## 2016-05-02 DIAGNOSIS — Z171 Estrogen receptor negative status [ER-]: Secondary | ICD-10-CM | POA: Diagnosis not present

## 2016-05-02 DIAGNOSIS — K5190 Ulcerative colitis, unspecified, without complications: Secondary | ICD-10-CM | POA: Diagnosis not present

## 2016-05-02 DIAGNOSIS — E1140 Type 2 diabetes mellitus with diabetic neuropathy, unspecified: Secondary | ICD-10-CM

## 2016-05-02 MED ORDER — ONDANSETRON HCL 8 MG PO TABS: 8.0000 mg | ORAL_TABLET | Freq: Two times a day (BID) | ORAL | 1 refills | Status: DC | PRN

## 2016-05-02 MED ORDER — LORAZEPAM 0.5 MG PO TABS: 0.5000 mg | ORAL_TABLET | Freq: Once | ORAL | 0 refills | Status: DC | PRN

## 2016-05-02 MED ORDER — DEXAMETHASONE 4 MG PO TABS: 4.0000 mg | ORAL_TABLET | Freq: Every day | ORAL | 1 refills | Status: DC

## 2016-05-02 MED ORDER — PROCHLORPERAZINE MALEATE 10 MG PO TABS: 10.0000 mg | ORAL_TABLET | Freq: Four times a day (QID) | ORAL | 1 refills | Status: DC | PRN

## 2016-05-02 NOTE — Telephone Encounter (Signed)
Left pt detailed message about appt 4/11

## 2016-05-02 NOTE — Progress Notes (Signed)
START ON PATHWAY REGIMEN - Breast   Docetaxel  + Carboplatin + Trastuzumab + Pertuzumab (TCHP) q21 Days:   A cycle is every 21 days:     Pertuzumab      Pertuzumab      Trastuzumab      Trastuzumab      Carboplatin      Docetaxel   **Always confirm dose/schedule in your pharmacy ordering system**    Trastuzumab (Maintenance - NO Loading Dose):   A cycle is every 21 days:     Trastuzumab   **Always confirm dose/schedule in your pharmacy ordering system**    Patient Characteristics: Preoperative or Nonsurgical Candidate (Clinical Staging), Neoadjuvant Therapy followed by Surgery, Invasive Disease, Chemotherapy, HER2 Positive, ER Negative/Unknown Therapeutic Status: Preoperative or Nonsurgical Candidate (Clinical Staging) AJCC M Category: cM0 Breast Surgical Plan: Neoadjuvant Therapy followed by Surgery AJCC 8 Stage Grouping: IIA ER Status: Negative (-) HER2 Status: Positive (+) AJCC T Category: T2 AJCC Grade: G3 AJCC N Category: cN0 PR Status: Negative (-)  Intent of Therapy: Curative Intent, Discussed with Patient

## 2016-05-02 NOTE — Progress Notes (Signed)
Nutrition Assessment  Reason for Assessment:  Pt seen in Breast Clinic  ASSESSMENT:   56 year old female with new diagnosis of left breast cancer.  Past medical history of ulcerative colitis, DM, HTN  Patient reports normal appetite but has been trying to change diet due to ulcerative colitis.    Medications:  reviewed  Labs: reviewed  Anthropometrics:   Height: 64 inches Weight: 214 lb 3.2 oz BMI: 36   NUTRITION DIAGNOSIS: Food and nutrition related knowledge deficit related to new diagnosis of breast cancer as evidenced by no prior need for nutrition related information.  INTERVENTION:   Discussed and provided packet of information regarding nutritional tips for breast cancer patients.  Questions answered.  Teachback method used.  Contact information provided and patient knows to contact me with questions/concerns.    MONITORING, EVALUATION, and GOAL: Pt will consume a healthy plant based diet to maintain lean body mass throughout treatment.   Neidra Girvan B. Zenia Resides, [place], [place] Registered Dietitian [phone] (pager)

## 2016-05-02 NOTE — Progress Notes (Signed)
Clinical Social Work [facility] Psychosocial Distress Screening [facility]  Patient completed distress screening protocol and scored a 5 on the Psychosocial Distress Thermometer which indicates moderate distress. Clinical Social Worker met with patient and patients family in [facility] to assess for distress and other psychosocial needs. Patient stated she was feeling overwhelmed but felt "better" after meeting with the treatment team and getting more information on her treatment plan. CSW and patient discussed common feeling and emotions when being diagnosed with cancer, and the importance of support during treatment. CSW informed patient of the support team and support services at [facility], and patient was agreeable to an [facility] referral. CSW provided contact information and encouraged patient to call with any questions or concerns.   ON[facility]N DISTRESS SCREENING 05/02/2016  Screening Type Initial Screening  Distress experienced in past week (1-10) 5  Emotional problem type Adjusting to illness  Referral to support programs Yes    Abigail Elmore, MSW, LCSW, OSW-C Clinical Social Worker [facility] [phone]       

## 2016-05-02 NOTE — Progress Notes (Addendum)
[place]  [phone] Fax:([phone] [place] Note   Patient Care Team: Orpah Melter, MD as PCP - General (Family Medicine) [name], MD as Consulting Physician (General Surgery) Truitt Merle, MD as Consulting Physician (Hematology) Gery Pray, MD as Consulting Physician (Radiation Oncology) Ladene Artist, MD as Consulting Physician (Gastroenterology) 05/02/2016  CHIEF COMPLAINTS/PURPOSE OF CONSULTATION:  Left breast cancer    Breast cancer of upper-outer quadrant of left female breast ([place])   04/24/2016 Mammogram    Category B breasts with a new irregular mass in the UOQ left breast middle depth. Ultrasound revealed a 3.9 cm mass in the 1:00 position. The left axilla was negative.       04/25/2016 Initial Biopsy    Biopsy of the left breast showed grade 3 invasive ductal carcinoma, DCIS, and lymphovascular invasion was present.      04/25/2016 Receptors her2    ER 0% negative, PR 0% negative, HER2 positive, Ki67 30%      05/02/2016 Initial Diagnosis    Breast cancer of upper-outer quadrant of left female breast ([place])      HISTORY OF PRESENTING ILLNESS:  Brandi Dickson 56 y.o. female is here because of a new diagnosis of left breast cancer. She is accompanied by her husband to our multidisciplinary breast clinic today.  The patient presented with a palpable left breast lump approximately 2 weeks ago. Bilateral diagnostic mammogram on 04/24/16 showed Category B breasts with a new irregular mass in the UOQ left breast middle depth. Ultrasound performed on 04/24/16 revealed a 3.9 cm mass in the 1:00 position. The left axilla was negative.  Biopsy of the left breast on 04/25/16 showed grade 3 invasive ductal carcinoma, DCIS, and lymphovascular invasion was present (ER 0% negative, PR 0% negative, HER2 positive, Ki67 30%).  She denies tenderness of the biopsied area. Reports minor dimpling. The patient is 8 weeks out from a left hip  replacement. She had a right hip replacement 2010 years ago.  The patient and her husband present today in multidisciplinary breast clinic to discuss treatment options for the management of her disease.  The patient is taking Neurontin for neuropathy in her feet from diabetes. She has been diagnosed for diabetes for the past 4 years. She states she only has neuropathy at night and take 2 Neurontin at night and then has no symptoms.  GYN HISTORY  Menarchal: 9 LMP: Complete hysterectomy for uterine fibroids ~ 2009. She was bleeding too much with her last menstrual cycle lasting 6 weeks. No issues with menopause. Contraceptive: no  HRT: No GP: G1P0  MEDICAL HISTORY:  Past Medical History:  Diagnosis Date  . Arthritis   . Asthma    triggered with [name] perfumes and cigarette smoke  . Colitis   . Diabetes mellitus without complication ([place])   . Hypertension   . Neuropathy ([place])     SURGICAL HISTORY: Past Surgical History:  Procedure Laterality Date  . ABDOMINAL HYSTERECTOMY    . DILATION AND CURETTAGE OF UTERUS    . TONSILLECTOMY    . TOTAL HIP ARTHROPLASTY Right   . TOTAL HIP ARTHROPLASTY Left 03/06/2016   Procedure: LEFT TOTAL HIP ARTHROPLASTY ANTERIOR APPROACH;  Surgeon: [name], MD;  Location: [facility];  Service: Orthopedics;  Laterality: Left;    SOCIAL HISTORY: Social History   Social History  . Marital status: Married    Spouse name: N/A  . Number of children: N/A  . Years of education: N/A  Occupational History  . Not on file.   Social History Main Topics  . Smoking status: Former Smoker    Packs/day: 1.00    Years: 29.00    Types: Cigarettes    Quit date: 10/27/2002  . Smokeless tobacco: Never Used  . Alcohol use No  . Drug use: No  . Sexual activity: Not on file   Other Topics Concern  . Not on file   Social History Narrative  . No narrative on file    FAMILY HISTORY: Family History  Problem Relation Age of Onset  . Colon cancer  Father   . Stomach cancer Paternal Uncle   . Stomach cancer Paternal Uncle   . Melanoma Brother   . Thyroid cancer Brother   . Breast cancer Maternal Aunt   . Breast cancer Maternal Aunt   . Breast cancer Cousin     ALLERGIES:  is allergic to lisinopril and augmentin [amoxicillin-pot clavulanate].  MEDICATIONS:  Current Outpatient Prescriptions  Medication Sig Dispense Refill  . acetaminophen (TYLENOL) 500 MG tablet Take 2 tablets (1,000 mg total) by mouth every 8 (eight) hours. (Patient not taking: Reported on 05/02/2016) 30 tablet 0  . Biotin 7500 MCG TABS Take 7,500 mcg by mouth daily.     . Black Cohosh-SoyIsoflav-Magnol (ESTROVEN MENOPAUSE RELIEF) CAPS Take 1 tablet by mouth daily.    [name] Kitchen dicyclomine (BENTYL) 20 MG tablet Take 1 tablet (20 mg total) by mouth 2 (two) times daily. (Patient taking differently: Take 20 mg by mouth 2 (two) times daily as needed (For colitis flare-ups.). ) 20 tablet 0  . docusate sodium (COLACE) 100 MG capsule Take 1 capsule (100 mg total) by mouth 2 (two) times daily. 10 capsule 0  . Esomeprazole Magnesium (NEXIUM 24HR) 20 MG TBEC Take 20 mg by mouth at bedtime as needed (For heartburn or acid reflux.).     [name] Kitchen ferrous sulfate (FERROUSUL) 325 (65 FE) MG tablet Take 1 tablet (325 mg total) by mouth 3 (three) times daily with meals.    . gabapentin (NEURONTIN) 100 MG capsule Take 200 mg by mouth at bedtime.     . hydrocortisone (ANUSOL-HC) 25 MG suppository Place 1 suppository (25 mg total) rectally 2 (two) times daily. (Patient taking differently: Place 25 mg rectally 2 (two) times daily as needed for hemorrhoids or itching. ) 20 suppository 2  . loperamide (IMODIUM) 1 MG/5ML solution Take 3 mg by mouth as needed for diarrhea or loose stools.    [name] Kitchen LORazepam (ATIVAN) 0.5 MG tablet Take 1 tablet (0.5 mg total) by mouth once as needed for anxiety. 2 tablet 0  . Mesalamine (ASACOL HD) 800 MG TBEC Take 1 tablet (800 mg total) by mouth 3 (three) times daily. 90 tablet 5   . metFORMIN (GLUCOPHAGE-XR) 500 MG 24 hr tablet Take 1,000 mg by mouth at bedtime.    . methocarbamol (ROBAXIN) 500 MG tablet Take 1 tablet (500 mg total) by mouth every 6 (six) hours as needed for muscle spasms. 40 tablet 0  . metoprolol (LOPRESSOR) 50 MG tablet Take 50 mg by mouth 2 (two) times daily.    . niacin 250 MG tablet Take 250 mg by mouth daily before breakfast.    . polyethylene glycol (MIRALAX / GLYCOLAX) packet Take 17 g by mouth 2 (two) times daily. 14 each 0  . Specialty Vitamins Products (VITAMINS FOR THE HAIR) TABS Take 2 tablets by mouth 2 (two) times daily.    . traMADol (ULTRAM) 50 MG tablet Take 1-2  tablets (50-100 mg total) by mouth every 6 (six) hours as needed. 40 tablet 0  . triamterene-hydrochlorothiazide (MAXZIDE) 75-50 MG per tablet Take 0.5 tablets by mouth daily.      No current facility-administered medications for this visit.     REVIEW OF SYSTEMS:   Constitutional: Denies fevers, chills or abnormal night sweats Eyes: Denies blurriness of vision, double vision or watery eyes Ears, nose, mouth, throat, and face: Denies mucositis or sore throat Respiratory: Denies cough, dyspnea or wheezes Cardiovascular: Denies palpitation, chest discomfort or lower extremity swelling Gastrointestinal:  Denies nausea, heartburn or change in bowel habits Skin: Denies abnormal skin rashes Lymphatics: Denies new lymphadenopathy or easy bruising Neurological: (+) Foot numbness from diabetic neuropathy. Behavioral/Psych: Mood is stable, no new changes  All other systems were reviewed with the patient and are negative.  PHYSICAL EXAMINATION: ECOG PERFORMANCE STATUS: 1 - Symptomatic but completely ambulatory  Vitals:   05/02/16 0829  BP: 112/65  Pulse: 75  Resp: 18  Temp: 98.2 F (36.8 C)   Filed Weights   05/02/16 0829  Weight: 214 lb 3.2 oz (97.2 kg)    GENERAL:alert, no distress and comfortable SKIN: skin color, texture, turgor are normal, no rashes or  significant lesions EYES: normal, conjunctiva are pink and non-injected, sclera clear OROPHARYNX:no exudate, no erythema and lips, buccal mucosa, and tongue normal  NECK: supple, thyroid normal size, non-tender, without nodularity LYMPH:  no palpable lymphadenopathy in the cervical, axillary or inguinal LUNGS: clear to auscultation and percussion with normal breathing effort HEART: regular rate & rhythm and no murmurs and no lower extremity edema ABDOMEN:abdomen soft, non-tender and normal bowel sounds Musculoskeletal:no cyanosis of digits and no clubbing  PSYCH: alert & oriented x 3 with fluent speech NEURO: no focal motor/sensory deficits BREAST: Breast inspection showed them to be symmetrical with no nipple discharge. Left breast exam showed UOQ 4 x 2.5 cm palpable mass, non-tender, no palpable mass in the right breast and bilateral axilla.  LABORATORY DATA:  I have reviewed the data as listed CBC Latest Ref Rng & Units 03/07/2016 02/27/2016 07/14/2015  WBC 4.0 - 10.5 K/uL 9.7 7.9 13.1(H)  Hemoglobin 12.0 - 15.0 g/dL 10.4(L) 13.1 13.6  Hematocrit 36.0 - 46.0 % 32.7(L) 40.9 41.2  Platelets 150 - 400 K/uL 218 264 290.0   CMP Latest Ref Rng & Units 03/07/2016 02/27/2016 07/14/2015  Glucose 65 - 99 mg/dL 107(H) 96 109(H)  BUN 6 - 20 mg/dL 12 12 9   Creatinine 0.44 - 1.00 mg/dL 0.76 0.95 0.89  Sodium 135 - 145 mmol/L 138 138 136  Potassium 3.5 - 5.1 mmol/L 4.7 3.8 3.0(L)  Chloride 101 - 111 mmol/L 105 100(L) 95(L)  CO2 22 - 32 mmol/L 27 28 34(H)  Calcium 8.9 - 10.3 mg/dL 8.6(L) 9.5 9.5  Total Protein 6.0 - 8.3 g/dL - - 8.1  Total Bilirubin 0.2 - 1.2 mg/dL - - 0.5  Alkaline Phos 39 - 117 U/L - - 92  AST 0 - 37 U/L - - 27  ALT 0 - 35 U/L - - 40(H)   PATHOLOGY REPORT:  See onc history   RADIOGRAPHIC STUDIES: I have personally reviewed the radiological images as listed and agreed with the findings in the report.  See onc history  ASSESSMENT & PLAN: 56 y.o. post-menopausal Caucasian  female with a self palpated left breast mass.  1. Breast cancer of upper-outer quadrant of left breast, invasive ductal carcinoma,  stage IIA (cT2N0M0) grade 3, ER-, PR-, HER2 amplified -We reviewed  the patient's imaging and pathology. -We reviewed her staging and biology of her breast cancer  -We discussed that surgical resection is the definitive treatment for breast cancer, she was seen by breast surgeon Dr. Lucia Gaskins today, lumpectomy versus mastectomy were discussed with patient. -We discussed HER2 positive breast cancers total approximately 15% of breast cancers and happens to be more aggressive than HER2 negative cancers, especially ER and PR negative disease, she has high likelihood of cancer recurrence after complete surgical resection.  -We discussed the benefit of neoadjuvant or adjuvant chemotherapy, to reduce her risk of cancer recurrence. Given her the large size of the primary tumor, I recommend neoadjuvant chemotherapy to shrink the tumor, and makes the lumpectomy surgery more feasible. -We discussed that HR negative HER-2 positive cancer are more sensitive to chemotherapy, she has a high likelihood to have good response to chemotherapy.  -We discussed various chemo regiment.Given her stage II disease, I recommend docetaxel, Carboplatin, Herceptin and pejeta (TCHP) every 3 weeks, for total of 6 cycles, followed by Herceptin and perjeta maintenance therapy to complete 1 year treatment. --Chemotherapy consent: Side effects including but does not not limited to, fatigue, nausea, vomiting, diarrhea, hair loss, neuropathy, fluid retention, renal and kidney dysfunction, neutropenic fever, needed for blood transfusion, bleeding, infusion reaction, skin rashes, congestive heart failure, etc, were discussed with patient in great detail. She agrees to proceed. -The goal of therapy is curative. -due to her DM, I will reduced dexa to 41m daily for the day before, and daily X3 after chemo to prevent  docetaxel allergy reaction -If she gets lumpectomy, she would also benefit from adjuvant breast radiation to reduce her risk of local recurrence. She was seen by a radiation oncologist Dr. KSondra Cometoday. -The patient has a family history of breast and colon cancers. She is eligible for genetic counseling. -will get breat MRI, echo before first cycle chemo, lab next week.   2. Genetics -Given her strong family history of breast cancer, we recommend her to see genetic counseling to ruled out inheritable breast cancer syndrome. She agrees  3. Type 2 Diabetes mellitus, HTN -Managed by her PCP. -The patient has peripheral neuropathy in her feet from her diabetes. The patient is already on Neurontin with 200 mg at night. We discussed that chemotherapy may make her neuropathy worse. -Steroids will be given to reduce chemo side effects and I will reduce the amount given to not affect her blood sugar much. -We'll monitor her blood glucose and blood pressure closely during her chemotherapy treatment.  4. Colitis -We discussed that chemotherapy would cause diarrhea and the patient's colitis may exacerbate during chemo  -She is not taking steroids for this. She is on mesalamine, will follow up with Dr. SFuller Plan  5. Arthritis  -s/p b/l hip replacement, last surgery 2 months ago   PLAN -Genetic counseling on 05/17/16 -Bilateral breast MRI on 05/05/16. -Echocardiogram and cardiology consult scheduled on 05/09/16 -Chemo class and lab on same day next week -Lab, flush, f/u, and neoadjuvant chemo TCHP on 05/11/16.   Orders Placed This Encounter  Procedures  . MR BREAST BILATERAL W WO CONTRAST    Wt 214/yes claus/creat drawn 03-07-16 in epic/[facility] bilat mammogram 04-24-16-ofc to have sent to [facility]/no needs/no metal in eyes or body/no implants/no hx of brain,heart,eye or ear sx/no breast implants/no kidney or  Liver dz/no lupus or ra/no htn/yes diab/nkda to iv dye/uhc/plm and dawn with epic order    Standing  Status:   Future    Standing  Expiration Date:   07/03/2017    Order Specific Question:   If indicated for the ordered procedure, I authorize the administration of contrast media per Radiology protocol    Answer:   Yes    Order Specific Question:   Reason for Exam (SYMPTOM  OR DIAGNOSIS REQUIRED)    Answer:   new breast cancer    Order Specific Question:   Preferred imaging location?    Answer:   [facility] (table limit-350 lbs)    Order Specific Question:   What is the patient's sedation requirement?    Answer:   No Sedation    Order Specific Question:   Does the patient have a pacemaker or implanted devices?    Answer:   No  . CBC with Differential    Standing Status:   Standing    Number of Occurrences:   40    Standing Expiration Date:   05/02/2021  . Comprehensive metabolic panel    Standing Status:   Standing    Number of Occurrences:   40    Standing Expiration Date:   05/02/2021  . ECHOCARDIOGRAM COMPLETE    Standing Status:   Future    Standing Expiration Date:   08/01/2017    Order Specific Question:   Where should this test be performed    Answer:   [place]    Order Specific Question:   Complete or Limited study?    Answer:   Complete    Order Specific Question:   Does the patient have a known history of hypersensitivity to Perflutren (aka Scientist, research (medical) for echocardiograms - CHECK ALLERGIES)    Answer:   No    Order Specific Question:   ADMINISTER PERFLUTERN    Answer:   ADMINISTER PERFLUTREN    Order Specific Question:   Expected Date:    Answer:   1 week    Order Specific Question:   Reason for exam-Echo    Answer:   Pre-operative cardiovascular examination V72.81 / Z01.810    All questions were answered. The patient knows to call the clinic with any problems, questions or concerns. I spent 55 minutes counseling the patient face to face. The total time spent in the appointment was 60 minutes and more than 50% was on counseling.     Truitt Merle,  MD 05/02/2016   This document serves as a record of services personally performed by Truitt Merle, MD. It was created on her behalf by Darcus Austin, a trained medical scribe. The creation of this record is based on the scribe's personal observations and the provider's statements to them. This document has been checked and approved by the attending provider.

## 2016-05-02 NOTE — Progress Notes (Signed)
Radiation Oncology         [phone] ________________________________  Name: Brandi Dickson MRN: [id number]  Date: 05/02/2016  DOB: [birth year]  [id number], Annie Main, MD  [name], MD     REFERRING PHYSICIAN: [name], MD   DIAGNOSIS: The encounter diagnosis was Malignant neoplasm of upper-outer quadrant of left breast in female, estrogen receptor negative ([place]).   HISTORY OF PRESENT ILLNESS: Brandi Dickson is a 56 y.o. female seen in the multidisciplinary breast clinic for a new diagnosis of left breast cancer. The patient self palpated a mass after toweling off while bathing and was seen for a mammogram. This revealed a mass in the left upper outer quadrant. She proceeded with diagnostic ultrasound which revealed a 3.9 cm mass and her axillary evaluation was negative for adenopathy. A biopsy on 04/25/16 revealed a grade 3 invasive ductal carcinoma with DCIS and LVSI, ER/PR negative, HER2 amplified, with a Ki 67 of 30%. She comes today to discuss her options for treatment of her breast cancer.    PREVIOUS RADIATION THERAPY: No   PAST MEDICAL HISTORY:  Past Medical History:  Diagnosis Date  . Arthritis   . Asthma    triggered with [name] perfumes and cigarette smoke  . Colitis   . Diabetes mellitus without complication ([place])   . Hypertension   . Neuropathy ([place])        PAST SURGICAL HISTORY: Past Surgical History:  Procedure Laterality Date  . ABDOMINAL HYSTERECTOMY    . DILATION AND CURETTAGE OF UTERUS    . TONSILLECTOMY    . TOTAL HIP ARTHROPLASTY Right   . TOTAL HIP ARTHROPLASTY Left 03/06/2016   Procedure: LEFT TOTAL HIP ARTHROPLASTY ANTERIOR APPROACH;  Surgeon: [name], MD;  Location: [facility];  Service: Orthopedics;  Laterality: Left;     FAMILY HISTORY:  Family History  Problem Relation Age of Onset  . Colon cancer Father   . Stomach cancer Paternal Uncle   . Stomach cancer Paternal Uncle   . Melanoma Brother   . Thyroid cancer Brother    . Breast cancer Maternal Aunt   . Breast cancer Maternal Aunt   . Breast cancer Cousin      SOCIAL HISTORY:  reports that she quit smoking about 13 years ago. Her smoking use included Cigarettes. She has a 29.00 pack-year smoking history. She has never used smokeless tobacco. She reports that she does not drink alcohol or use drugs. The patient is married and lives in [place]. 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    ALLERGIES: Lisinopril and Augmentin [amoxicillin-pot clavulanate]   MEDICATIONS:  Current Outpatient Prescriptions  Medication Sig Dispense Refill  . acetaminophen (TYLENOL) 500 MG tablet Take 2 tablets (1,000 mg total) by mouth every 8 (eight) hours. 30 tablet 0  . Biotin 7500 MCG TABS Take 7,500 mcg by mouth daily.     . Black Cohosh-SoyIsoflav-Magnol (ESTROVEN MENOPAUSE RELIEF) CAPS Take 1 tablet by mouth daily.    [name] Kitchen dicyclomine (BENTYL) 20 MG tablet Take 1 tablet (20 mg total) by mouth 2 (two) times daily. (Patient taking differently: Take 20 mg by mouth 2 (two) times daily as needed (For colitis flare-ups.). ) 20 tablet 0  . docusate sodium (COLACE) 100 MG capsule Take 1 capsule (100 mg total) by mouth 2 (two) times daily. 10 capsule 0  . Esomeprazole Magnesium (NEXIUM 24HR) 20 MG TBEC Take 20 mg by mouth at bedtime as needed (For heartburn or  acid reflux.).     [name] Kitchen ferrous sulfate (FERROUSUL) 325 (65 FE) MG tablet Take 1 tablet (325 mg total) by mouth 3 (three) times daily with meals.    . gabapentin (NEURONTIN) 100 MG capsule Take 200 mg by mouth at bedtime.     . hydrocortisone (ANUSOL-HC) 25 MG suppository Place 1 suppository (25 mg total) rectally 2 (two) times daily. (Patient taking differently: Place 25 mg rectally 2 (two) times daily as needed for hemorrhoids or itching. ) 20 suppository 2  . loperamide (IMODIUM) 1 MG/5ML solution Take 3 mg by mouth as needed for diarrhea or loose stools.    . Mesalamine (ASACOL HD) 800  MG TBEC Take 1 tablet (800 mg total) by mouth 3 (three) times daily. 90 tablet 5  . metFORMIN (GLUCOPHAGE-XR) 500 MG 24 hr tablet Take 1,000 mg by mouth at bedtime.    . methocarbamol (ROBAXIN) 500 MG tablet Take 1 tablet (500 mg total) by mouth every 6 (six) hours as needed for muscle spasms. 40 tablet 0  . metoprolol (LOPRESSOR) 50 MG tablet Take 50 mg by mouth 2 (two) times daily.    . niacin 250 MG tablet Take 250 mg by mouth daily before breakfast.    . polyethylene glycol (MIRALAX / GLYCOLAX) packet Take 17 g by mouth 2 (two) times daily. 14 each 0  . Specialty Vitamins Products (VITAMINS FOR THE HAIR) TABS Take 2 tablets by mouth 2 (two) times daily.    . traMADol (ULTRAM) 50 MG tablet Take 1-2 tablets (50-100 mg total) by mouth every 6 (six) hours as needed. 40 tablet 0  . triamterene-hydrochlorothiazide (MAXZIDE) 75-50 MG per tablet Take 0.5 tablets by mouth daily.      No current facility-administered medications for this encounter.      REVIEW OF SYSTEMS: On review of systems, the patient reports that she is doing well overall. She denies any chest pain, shortness of breath, cough, fevers, chills, night sweats, unintended weight changes. In fact she's been successful in weight loss and is recovering from hip surgery as well. She is getting along well since this surgery was performed about 6 weeks ago and walks with the assistance of a cane when out in public. She denies any bowel or bladder disturbances, and denies abdominal pain, nausea or vomiting. She denies any additional musculoskeletal or joint aches or pains. A complete review of systems is obtained and is otherwise negative.     PHYSICAL EXAM:  Wt Readings from Last 3 Encounters:  05/02/16 214 lb 3.2 oz (97.2 kg)  03/06/16 222 lb (100.7 kg)  02/27/16 222 lb 12.8 oz (101.1 kg)   Temp Readings from Last 3 Encounters:  05/02/16 98.2 F (36.8 C) (Oral)  03/07/16 98.2 F (36.8 C) (Oral)  02/27/16 98.9 F (37.2 C) (Oral)    BP Readings from Last 3 Encounters:  05/02/16 112/65  03/07/16 119/66  02/27/16 125/61   Pulse Readings from Last 3 Encounters:  05/02/16 75  03/07/16 76  02/27/16 70   In general this is a well appearing caucasian female in no acute distress. She is alert and oriented x4 and appropriate throughout the examination. HEENT reveals that the patient is normocephalic, atraumatic. EOMs are intact. Skin is intact without any evidence of gross lesions. Cardiopulmonary assessment is negative for acute distress and she exhibits normal effort. Lymphatic assessment is performed and does not reveal any adenopathy in the cervical, supraclavicular, axillary, or inguinal chains. Bilateral breast examination is performed. No gross assymetry is  noted, no skin retraction is noted of either breast. No nipple bleeding or discharge is noted of either breast. Post biopsy changes are noted along the left breast without palpable mass, and the right breast does not reveal any palpable abnormalities. Abdomen has active bowel sounds in all quadrants and is intact. The abdomen is soft, non tender, non distended. Lower extremities reveals bilateral 1+ pretibial pitting. No edema, deep calf tenderness, cyanosis or clubbing is noted.   ECOG = 0  0 - Asymptomatic (Fully active, able to carry on all predisease activities without restriction)  1 - Symptomatic but completely ambulatory (Restricted in physically strenuous activity but ambulatory and able to carry out work of a light or sedentary nature. For example, light housework, office work)  2 - Symptomatic, <50% in bed during the day (Ambulatory and capable of all self care but unable to carry out any work activities. Up and about more than 50% of waking hours)  3 - Symptomatic, >50% in bed, but not bedbound (Capable of only limited self-care, confined to bed or chair 50% or more of waking hours)  4 - Bedbound (Completely disabled. Cannot carry on any self-care. Totally  confined to bed or chair)  5 - Death   Eustace Pen MM, Creech RH, Tormey DC, et al. [phone]). "Toxicity and response criteria of the [facility]". [place] Oncol. 5 (6): 649-55    LABORATORY DATA:  Lab Results  Component Value Date   WBC 9.7 04-Feb-202018   HGB 10.4 (L) 04-Feb-202018   HCT 32.7 (L) 04-Feb-202018   MCV 84.3 04-Feb-202018   PLT 218 04-Feb-202018   Lab Results  Component Value Date   NA 138 04-Feb-202018   K 4.7 04-Feb-202018   CL 105 04-Feb-202018   CO2 27 04-Feb-202018   Lab Results  Component Value Date   ALT 40 (H) 07/14/2015   AST 27 07/14/2015   ALKPHOS 92 07/14/2015   BILITOT 0.5 07/14/2015      RADIOGRAPHY: No results found.     IMPRESSION/PLAN: 1. Stage IIA, cT2,N0  ER/PR negative, HER2 amplified grade 3 invasive ductal carcinoma with DCIS of the left breast. Dr. Sondra Come discusses the pathology findings and reviews the nature of invasive ductal breast disease. The consensus from the breast conference includes neoadjuvant chemotherapy with targeted Herceptin as well. An MRI prior to this is recommended as a baseline to characterize her disease and to have a baseline study for comparison purposes following chemotherapy. She is also going to meet with genetic counseling which may alter her surgical recommendations and or decisions regarding which procedure she'd be interested in. If she elects for post chemotherapy lumpectomy and sentinel node evaluation rather than mastectomy, radiotherapy would be recommended as an adjuvant therapy to reduce the risk of recurrence. There does not appear to be a role for antiestrogen therapy due to her receptor status. We discussed the risks, benefits, short, and long term effects of radiotherapy, and the patient is interested in proceeding. Dr. Sondra Come discusses the delivery and logistics of radiotherapy, and would recommend a course of 5-6 1/2 weeks of daily treatment. Given that her disease is localized to the left breast, we will  anticipate deep inspiration breath hold technique as well. We will see her back about 2 weeks after completing surgery to move forward with the simulation and planning process and anticipate starting radiotherapy about 4-6 weeks after surgery.  2. Possible genetic predisposition to malignancy. Given her personal history of cancer and family history, she has  been offered an evaluation with genetics. She will meet with genetic counseling on 05/17/16 to pursue testing.   The above documentation reflects my direct findings during this shared patient visit. Please see the separate note by Dr. Sondra Come on this date for the remainder of the patient's plan of care.    Carola Rhine, PAC

## 2016-05-03 ENCOUNTER — Encounter (HOSPITAL_BASED_OUTPATIENT_CLINIC_OR_DEPARTMENT_OTHER): Payer: Self-pay | Admitting: *Deleted

## 2016-05-03 NOTE — Progress Notes (Signed)
Bring all medications. Coming tomorrow for BMET. 

## 2016-05-05 ENCOUNTER — Telehealth: Payer: Self-pay | Admitting: Hematology

## 2016-05-05 ENCOUNTER — Ambulatory Visit
Admission: RE | Admit: 2016-05-05 | Discharge: 2016-05-05 | Disposition: A | Discharge status: Home / Self Care | Payer: 59 | Source: Ambulatory Visit | Attending: Hematology | Admitting: Hematology

## 2016-05-05 DIAGNOSIS — C50412 Malignant neoplasm of upper-outer quadrant of left female breast: Secondary | ICD-10-CM

## 2016-05-05 DIAGNOSIS — N6321 Unspecified lump in the left breast, upper outer quadrant: Secondary | ICD-10-CM | POA: Diagnosis not present

## 2016-05-05 DIAGNOSIS — Z171 Estrogen receptor negative status [ER-]: Principal | ICD-10-CM

## 2016-05-05 MED ORDER — GADOBENATE DIMEGLUMINE 529 MG/ML IV SOLN
19.0000 mL | Freq: Once | INTRAVENOUS | Status: AC | PRN
  Administered 2016-05-05: 19 mL via INTRAVENOUS

## 2016-05-05 NOTE — Telephone Encounter (Signed)
Scheduled Chemo education class per sch message from [facility] . Left message with appt date and time.

## 2016-05-07 ENCOUNTER — Telehealth: Payer: Self-pay | Admitting: *Deleted

## 2016-05-07 NOTE — H&P (Signed)
Brandi Dickson  Location: [facility] Patient #: [id number] DOB: [birth year] Undefined / Language: Cleophus Molt / Race: Refused to Report/Unreported Female  History of Present Illness   The patient is a 56 year old female who presents with a complaint of left breast cancer.   The PCP is Dr. [name]  The patient was referred by Dr. [name]  The pateint is at the [name] is Drs. Burr Medico and Kinard  She is accompanied by her husband, Brandi Dickson.  She felt a mass in her left breast when she was on a trip to [place] with her husband. They were going there to visit friends. She was seeing Dr. [name], but he has retired, so she saw Dr. [name]. Her last mammogram was about 2 years ago. She updated her mammogram  Mammograms: at [facility] on on 04/24/2016 which showed a 3.9 cm lobulated mass in the left upper outer quadrant Biopsy: 04/25/2016 ([id number]) - IDC, grade 3, PR - 0%, ER - 0%, Ki67- 30%, and Her2Neu - positive Family history of breast or ovarian cancer: 2 aunts on her mother's side and one cousin On hormone therapy: None  I discussed the options for breast cancer treatment with the patient. The patient is at the [facility], which includes medical oncology and radiation oncology. I discussed the surgical options of lumpectomy vs. mastectomy. If mastectomy, there is the possibility of reconstruction. I discussed the options of lymph node biopsy. The treatment plan depends on the pathologic staging of the tumor and the patient's personal wishes. She appears to best suited for up front neoadjuvant chemotx. The risks of surgery include, but are not limited to, bleeding, infection, the need for further surgery, and nerve injury. The patient has been given literature on the treatment of breast cancer.  Plan: 1) Neoadjuvant chemotx/anti Her2Neu therapy, 2) port placement, 3) MRI, 4) Genetics, 5) left breast lumpectomy  and left axillary SLNBx, 6) Rad tx to the left breast  I discussed the indications and potential complications of the power port placement. The primary complications of the power port, include, but are not limited to, bleeding, infection, nerve injury, thrombosis, and pneumothorax.  Past Medical History: 1. Claustrophobia I gave her Valium - 5 mg x 2 for the day of the MRI 2. Quit smoking 2004 3. UC diagnosed in July 2017 last colonoscopy in 2017 - UC well controlled on current meds and when she watches what she eats 4. DM - x 4 years Stable HbgA1c at 5.9 5. Left hip replaced by Dr. Alvan Dame She is still in recovery for this.  Social History: She is accompanied by her husband, Brandi Dickson. 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 Hip Surgery  Bilateral. Hysterectomy (not due to cancer) - Complete  Tonsillectomy   Diagnostic Studies History [name], RN; 05/02/2016 8:13 AM) Colonoscopy  within last year Mammogram  within last year Pap Smear  1-5 years ago  Medication History [name], RN; 05/02/2016 8:13 AM) Medications Reconciled  Social History [name], RN; 05/02/2016 8:13 AM) Alcohol use  Remotely quit alcohol use. Caffeine use  Carbonated beverages, Tea. No drug use  Tobacco use  Former smoker.  Family History [name], RN; 05/02/2016 8:13 AM) Breast Cancer  Family Members In General. Cancer  Family Members In General. Cerebrovascular Accident  Mother. Colon Cancer  Father. Colon Polyps  Father. Diabetes  Mellitus  Family Members In General. Heart Disease  Family Members In General. Hypertension  Mother. Melanoma  Brother. Thyroid problems  Brother.  Pregnancy / Birth History [name], RN; 05/02/2016 8:13 AM) Age at menarche  84 years. Age of menopause  76-50 Contraceptive History  Oral  contraceptives. Gravida  1 Maternal age  42-25  Other Problems [name], RN; 05/02/2016 8:13 AM) Arthritis  Asthma  Diabetes Mellitus  High blood pressure  Hypercholesterolemia  Lump In Breast  Ulcerative Colitis     Review of Systems [name] RN; 05/02/2016 8:13 AM) General Not Present- Appetite Loss, Chills, Fatigue, Fever, Night Sweats, Weight Gain and Weight Loss. Skin Not Present- Change in Wart/Mole, Dryness, Hives, Jaundice, New Lesions, Non-Healing Wounds, Rash and Ulcer. HEENT Present- Wears glasses/contact lenses. Not Present- Earache, Hearing Loss, Hoarseness, Nose Bleed, Oral Ulcers, Ringing in the Ears, Seasonal Allergies, Sinus Pain, Sore Throat, Visual Disturbances and Yellow Eyes. Respiratory Not Present- Bloody sputum, Chronic Cough, Difficulty Breathing, Snoring and Wheezing. Breast Present- Breast Mass. Not Present- Breast Pain, Nipple Discharge and Skin Changes. Cardiovascular Not Present- Chest Pain, Difficulty Breathing Lying Down, Leg Cramps, Palpitations, Rapid Heart Rate, Shortness of Breath and Swelling of Extremities. Gastrointestinal Not Present- Abdominal Pain, Bloating, Bloody Stool, Change in Bowel Habits, Chronic diarrhea, Constipation, Difficulty Swallowing, Excessive gas, Gets full quickly at meals, Hemorrhoids, Indigestion, Nausea, Rectal Pain and Vomiting. Female Genitourinary Not Present- Frequency, Nocturia, Painful Urination, Pelvic Pain and Urgency. Musculoskeletal Not Present- Back Pain, Joint Pain, Joint Stiffness, Muscle Pain, Muscle Weakness and Swelling of Extremities. Neurological Not Present- Decreased Memory, Fainting, Headaches, Numbness, Seizures, Tingling, Tremor, Trouble walking and Weakness. Psychiatric Not Present- Anxiety, Bipolar, Change in Sleep Pattern, Depression, Fearful and Frequent crying. Endocrine Not Present- Cold Intolerance, Excessive Hunger, Hair Changes, Heat Intolerance, Hot flashes and New  Diabetes. Hematology Present- Blood Thinners. Not Present- Easy Bruising, Excessive bleeding, Gland problems, HIV and Persistent Infections.   Physical Exam  General: WN moderately obese WF, alert and generally healthy appearing. Skin: Inspection and palpation of the skin unremarkable.  Eyes: Conjunctivae white, pupils equal. Face, ears, nose, mouth, and throat: Face - normal. Normal ears and nose. Lips and teeth normal.  Neck: Supple. No mass. Trachea midline. No thyroid mass. Lymph Nodes: No supraclavicular or cervical adenopathy.  No axillary adenopathy. Particular attention paid to the left axilla.  Lungs: Normal respiratory effort. Clear to auscultation and symmetric breath sounds. Cardiovascular: Regular rate and rythm. Normal auscultation of the heart. No murmur or rub.  Breasts: right - no mass or nodule  Left - 3.0 cm mass in the 1 o'clock position, about 5 cm off the areaola  Abdomen: Soft. No mass. Liver and spleen not palpable. No tenderness. No hernia. Normal bowel sounds. No abdominal scars. Rectal: Not done.  Musculoskeletal/extremities: Normal gait.  Good strength and ROM in upper and lower extremities.  Neurologic: Grossly intact to motor and sensory function.  Psychiatric: Has normal mood and affect.  Judgement and insight appear normal.   Assessment & Plan  1.  BREAST CANCER, STAGE 2, LEFT (C50.912)  Story: Mammograms: at [facility] on on 04/24/2016 which showed a 3.9 cm lobulated mass in the left upper outer quadrant  Biopsy: 04/25/2016 ([id number]) - IDC, grade 3, PR - 0%, ER - 0%, Ki67- 30%, and Her2Neu - positive  Oncology - Feng/Kinard  Plan:   1) Neoadjuvant chemotx/anti Her2Neu therapy,   2) port placement,    PAC placement scheduled 4/10 @ [facility] @ 2:00pm.   3)  MRI,   4) Genetics,   5) left breast lumpectomy and left axillary SLNBx,   6) Rad tx to the left breast  2.  CHRONIC ULCERATIVE COLITIS WITHOUT COMPLICATION, UNSPECIFIED LOCATION  (K51.90)  Impression: Followed by Dr. Fuller Plan  last colonoscopy in 2017 - UC well controlled on current meds and when she watches what she eats 3. Claustrophobia I gave her Valium - 5 mg x 2 for the day of the MRI 4. Quit smoking 2004 5. DM - x 4 years Stable HbgA1c at 5.9 6. Left hip replaced by Dr. Alvan Dame She is still in recovery for this.    [name], MD, [facility] Pager: [phone] Office phone:  [phone]

## 2016-05-07 NOTE — Telephone Encounter (Signed)
Spoke to pt regarding Brandi Dickson from 05/02/16. Denies questions or concerns regarding dx or treatment care plan. Scheduled lab appt for 4/12 after chemo class and flush and appt with [name] on 4/13. Confirmed date and time. Informed pt she will be scheduled for chemo after appt with [name] on 4/13. Received verbal understanding. Encourage pt to call with needs.

## 2016-05-08 ENCOUNTER — Ambulatory Visit (HOSPITAL_BASED_OUTPATIENT_CLINIC_OR_DEPARTMENT_OTHER)
Admission: RE | Admit: 2016-05-08 | Discharge: 2016-05-08 | Disposition: A | Discharge status: Home / Self Care | Payer: 59 | Source: Ambulatory Visit | Attending: Surgery | Admitting: Surgery

## 2016-05-08 ENCOUNTER — Ambulatory Visit (HOSPITAL_COMMUNITY)
Admission: RE | Admit: 2016-05-08 | Discharge: 2016-05-08 | Disposition: A | Discharge status: Home / Self Care | Payer: 59 | Source: Ambulatory Visit | Attending: Surgery | Admitting: Surgery

## 2016-05-08 ENCOUNTER — Ambulatory Visit (HOSPITAL_BASED_OUTPATIENT_CLINIC_OR_DEPARTMENT_OTHER): Payer: 59 | Admitting: Certified Registered Nurse Anesthetist

## 2016-05-08 ENCOUNTER — Encounter (HOSPITAL_BASED_OUTPATIENT_CLINIC_OR_DEPARTMENT_OTHER)
Admission: RE | Disposition: A | Discharge status: Home / Self Care | Payer: Self-pay | Source: Ambulatory Visit | Attending: Surgery

## 2016-05-08 ENCOUNTER — Ambulatory Visit (HOSPITAL_COMMUNITY): Payer: 59

## 2016-05-08 ENCOUNTER — Encounter (HOSPITAL_BASED_OUTPATIENT_CLINIC_OR_DEPARTMENT_OTHER): Payer: Self-pay | Admitting: Certified Registered Nurse Anesthetist

## 2016-05-08 DIAGNOSIS — E119 Type 2 diabetes mellitus without complications: Secondary | ICD-10-CM | POA: Insufficient documentation

## 2016-05-08 DIAGNOSIS — Z7982 Long term (current) use of aspirin: Secondary | ICD-10-CM | POA: Diagnosis not present

## 2016-05-08 DIAGNOSIS — C50412 Malignant neoplasm of upper-outer quadrant of left female breast: Secondary | ICD-10-CM | POA: Diagnosis not present

## 2016-05-08 DIAGNOSIS — C50911 Malignant neoplasm of unspecified site of right female breast: Secondary | ICD-10-CM

## 2016-05-08 DIAGNOSIS — I10 Essential (primary) hypertension: Secondary | ICD-10-CM | POA: Diagnosis not present

## 2016-05-08 DIAGNOSIS — Z79899 Other long term (current) drug therapy: Secondary | ICD-10-CM | POA: Diagnosis not present

## 2016-05-08 DIAGNOSIS — Z6836 Body mass index (BMI) 36.0-36.9, adult: Secondary | ICD-10-CM | POA: Insufficient documentation

## 2016-05-08 DIAGNOSIS — C50912 Malignant neoplasm of unspecified site of left female breast: Secondary | ICD-10-CM | POA: Diagnosis not present

## 2016-05-08 DIAGNOSIS — Z95828 Presence of other vascular implants and grafts: Secondary | ICD-10-CM

## 2016-05-08 DIAGNOSIS — Z96642 Presence of left artificial hip joint: Secondary | ICD-10-CM | POA: Insufficient documentation

## 2016-05-08 DIAGNOSIS — Z170 Estrogen receptor positive status [ER+]: Secondary | ICD-10-CM | POA: Insufficient documentation

## 2016-05-08 DIAGNOSIS — J45909 Unspecified asthma, uncomplicated: Secondary | ICD-10-CM | POA: Diagnosis not present

## 2016-05-08 DIAGNOSIS — Z8041 Family history of malignant neoplasm of ovary: Secondary | ICD-10-CM | POA: Insufficient documentation

## 2016-05-08 DIAGNOSIS — Z87891 Personal history of nicotine dependence: Secondary | ICD-10-CM | POA: Insufficient documentation

## 2016-05-08 DIAGNOSIS — K5190 Ulcerative colitis, unspecified, without complications: Secondary | ICD-10-CM | POA: Insufficient documentation

## 2016-05-08 DIAGNOSIS — Z7984 Long term (current) use of oral hypoglycemic drugs: Secondary | ICD-10-CM | POA: Insufficient documentation

## 2016-05-08 DIAGNOSIS — Z7952 Long term (current) use of systemic steroids: Secondary | ICD-10-CM | POA: Insufficient documentation

## 2016-05-08 DIAGNOSIS — Z452 Encounter for adjustment and management of vascular access device: Secondary | ICD-10-CM | POA: Diagnosis not present

## 2016-05-08 HISTORY — PX: PORTACATH PLACEMENT: SHX2246

## 2016-05-08 HISTORY — DX: Malignant (primary) neoplasm, unspecified: C80.1

## 2016-05-08 LAB — POCT I-STAT, CHEM 8
BUN: 13 mg/dL (ref 6–20)
CALCIUM ION: 1.17 mmol/L (ref 1.15–1.40)
CREATININE: 1 mg/dL (ref 0.44–1.00)
Chloride: 102 mmol/L (ref 101–111)
GLUCOSE: 107 mg/dL — AB (ref 65–99)
HCT: 44 % (ref 36.0–46.0)
HEMOGLOBIN: 15 g/dL (ref 12.0–15.0)
Potassium: 3.7 mmol/L (ref 3.5–5.1)
Sodium: 141 mmol/L (ref 135–145)
TCO2: 28 mmol/L (ref 0–100)

## 2016-05-08 LAB — GLUCOSE, CAPILLARY: Glucose-Capillary: 113 mg/dL — ABNORMAL HIGH (ref 65–99)

## 2016-05-08 SURGERY — INSERTION, TUNNELED CENTRAL VENOUS DEVICE, WITH PORT
Anesthesia: General | Site: Chest | Laterality: Right

## 2016-05-08 MED ORDER — OXYCODONE HCL 5 MG PO TABS
5.0000 mg | ORAL_TABLET | Freq: Once | ORAL | Status: AC | PRN
  Administered 2016-05-08: 5 mg via ORAL

## 2016-05-08 MED ORDER — CEFAZOLIN SODIUM-DEXTROSE 2-4 GM/100ML-% IV SOLN
2.0000 g | INTRAVENOUS | Status: AC
  Administered 2016-05-08: 2 g via INTRAVENOUS

## 2016-05-08 MED ORDER — BUPIVACAINE HCL 0.25 % IJ SOLN
INTRAMUSCULAR | Status: DC | PRN
  Administered 2016-05-08: 7 mL

## 2016-05-08 MED ORDER — LIDOCAINE HCL (CARDIAC) 20 MG/ML IV SOLN
INTRAVENOUS | Status: DC | PRN
  Administered 2016-05-08: 50 mg via INTRAVENOUS

## 2016-05-08 MED ORDER — SCOPOLAMINE 1 MG/3DAYS TD PT72: 1.0000 | MEDICATED_PATCH | Freq: Once | TRANSDERMAL | Status: DC | PRN

## 2016-05-08 MED ORDER — FENTANYL CITRATE (PF) 100 MCG/2ML IJ SOLN
INTRAMUSCULAR | Status: AC
  Filled 2016-05-08: qty 2

## 2016-05-08 MED ORDER — GABAPENTIN 300 MG PO CAPS
300.0000 mg | ORAL_CAPSULE | ORAL | Status: AC
  Administered 2016-05-08: 300 mg via ORAL

## 2016-05-08 MED ORDER — EPHEDRINE SULFATE 50 MG/ML IJ SOLN
INTRAMUSCULAR | Status: DC | PRN
  Administered 2016-05-08 (×2): 10 mg via INTRAVENOUS

## 2016-05-08 MED ORDER — PROPOFOL 10 MG/ML IV BOLUS
INTRAVENOUS | Status: AC
  Filled 2016-05-08: qty 20

## 2016-05-08 MED ORDER — HEPARIN SOD (PORK) LOCK FLUSH 100 UNIT/ML IV SOLN
INTRAVENOUS | Status: AC
  Filled 2016-05-08: qty 5

## 2016-05-08 MED ORDER — LIDOCAINE-PRILOCAINE 2.5-2.5 % EX CREA: 1.0000 "application " | TOPICAL_CREAM | CUTANEOUS | 0 refills | Status: DC | PRN

## 2016-05-08 MED ORDER — LIDOCAINE 2% (20 MG/ML) 5 ML SYRINGE
INTRAMUSCULAR | Status: AC
  Filled 2016-05-08: qty 5

## 2016-05-08 MED ORDER — HEPARIN SODIUM (PORCINE) 1000 UNIT/ML IJ SOLN
INTRAMUSCULAR | Status: AC
  Filled 2016-05-08: qty 1

## 2016-05-08 MED ORDER — EPHEDRINE 5 MG/ML INJ
INTRAVENOUS | Status: AC
  Filled 2016-05-08: qty 10

## 2016-05-08 MED ORDER — OXYCODONE HCL 5 MG PO TABS
ORAL_TABLET | ORAL | Status: AC
  Filled 2016-05-08: qty 1

## 2016-05-08 MED ORDER — ONDANSETRON HCL 4 MG/2ML IJ SOLN
INTRAMUSCULAR | Status: DC | PRN
  Administered 2016-05-08: 4 mg via INTRAVENOUS

## 2016-05-08 MED ORDER — DEXAMETHASONE SODIUM PHOSPHATE 10 MG/ML IJ SOLN
INTRAMUSCULAR | Status: AC
  Filled 2016-05-08: qty 1

## 2016-05-08 MED ORDER — ACETAMINOPHEN 500 MG PO TABS
1000.0000 mg | ORAL_TABLET | ORAL | Status: AC
  Administered 2016-05-08: 1000 mg via ORAL

## 2016-05-08 MED ORDER — MIDAZOLAM HCL 2 MG/2ML IJ SOLN
INTRAMUSCULAR | Status: AC
  Filled 2016-05-08: qty 2

## 2016-05-08 MED ORDER — HYDROMORPHONE HCL 1 MG/ML IJ SOLN: 0.2500 mg | INTRAMUSCULAR | Status: DC | PRN

## 2016-05-08 MED ORDER — LACTATED RINGERS IV SOLN
INTRAVENOUS | Status: DC
  Administered 2016-05-08 (×2): via INTRAVENOUS

## 2016-05-08 MED ORDER — PROMETHAZINE HCL 25 MG/ML IJ SOLN: 6.2500 mg | INTRAMUSCULAR | Status: DC | PRN

## 2016-05-08 MED ORDER — GABAPENTIN 300 MG PO CAPS
ORAL_CAPSULE | ORAL | Status: AC
  Filled 2016-05-08: qty 1

## 2016-05-08 MED ORDER — MEPERIDINE HCL 25 MG/ML IJ SOLN: 6.2500 mg | INTRAMUSCULAR | Status: DC | PRN

## 2016-05-08 MED ORDER — PROPOFOL 10 MG/ML IV BOLUS
INTRAVENOUS | Status: DC | PRN
  Administered 2016-05-08: 200 mg via INTRAVENOUS

## 2016-05-08 MED ORDER — CHLORHEXIDINE GLUCONATE CLOTH 2 % EX PADS
6.0000 | MEDICATED_PAD | Freq: Once | CUTANEOUS | Status: DC
Start: 2016-05-08 — End: 2016-05-08

## 2016-05-08 MED ORDER — HEPARIN (PORCINE) IN NACL 2-0.9 UNIT/ML-% IJ SOLN
INTRAMUSCULAR | Status: DC | PRN
  Administered 2016-05-08: 1 via INTRAVENOUS

## 2016-05-08 MED ORDER — MIDAZOLAM HCL 2 MG/2ML IJ SOLN
1.0000 mg | INTRAMUSCULAR | Status: DC | PRN
  Administered 2016-05-08: 2 mg via INTRAVENOUS

## 2016-05-08 MED ORDER — DEXAMETHASONE SODIUM PHOSPHATE 10 MG/ML IJ SOLN
INTRAMUSCULAR | Status: DC | PRN
  Administered 2016-05-08: 5 mg via INTRAVENOUS

## 2016-05-08 MED ORDER — ONDANSETRON HCL 4 MG/2ML IJ SOLN
INTRAMUSCULAR | Status: AC
  Filled 2016-05-08: qty 2

## 2016-05-08 MED ORDER — FENTANYL CITRATE (PF) 100 MCG/2ML IJ SOLN
50.0000 ug | INTRAMUSCULAR | Status: AC | PRN
  Administered 2016-05-08: 50 ug via INTRAVENOUS
  Administered 2016-05-08 (×2): 25 ug via INTRAVENOUS

## 2016-05-08 MED ORDER — CEFAZOLIN SODIUM-DEXTROSE 2-4 GM/100ML-% IV SOLN
INTRAVENOUS | Status: AC
  Filled 2016-05-08: qty 100

## 2016-05-08 MED ORDER — HEPARIN SOD (PORK) LOCK FLUSH 100 UNIT/ML IV SOLN
INTRAVENOUS | Status: DC | PRN
  Administered 2016-05-08: 400 [IU] via INTRAVENOUS

## 2016-05-08 MED ORDER — BUPIVACAINE HCL (PF) 0.25 % IJ SOLN
INTRAMUSCULAR | Status: AC
  Filled 2016-05-08: qty 30

## 2016-05-08 MED ORDER — OXYCODONE HCL 5 MG/5ML PO SOLN: 5.0000 mg | Freq: Once | ORAL | Status: AC | PRN

## 2016-05-08 MED ORDER — CHLORHEXIDINE GLUCONATE CLOTH 2 % EX PADS: 6.0000 | MEDICATED_PAD | Freq: Once | CUTANEOUS | Status: DC

## 2016-05-08 MED ORDER — ACETAMINOPHEN 500 MG PO TABS
ORAL_TABLET | ORAL | Status: AC
  Filled 2016-05-08: qty 2

## 2016-05-08 MED ORDER — BUPIVACAINE HCL (PF) 0.5 % IJ SOLN
INTRAMUSCULAR | Status: AC
  Filled 2016-05-08: qty 30

## 2016-05-08 SURGICAL SUPPLY — 47 items
BAG DECANTER FOR FLEXI CONT (MISCELLANEOUS) ×2 IMPLANT
BENZOIN TINCTURE PRP APPL 2/3 (GAUZE/BANDAGES/DRESSINGS) ×2 IMPLANT
BLADE SURG 15 STRL LF DISP TIS (BLADE) ×1 IMPLANT
BLADE SURG 15 STRL SS (BLADE) ×1
CHLORAPREP W/TINT 26ML (MISCELLANEOUS) ×2 IMPLANT
CLEANER CAUTERY TIP 5X5 PAD (MISCELLANEOUS) ×1 IMPLANT
COVER BACK TABLE 60X90IN (DRAPES) ×2 IMPLANT
COVER MAYO STAND STRL (DRAPES) ×2 IMPLANT
COVER PROBE 5X48 (MISCELLANEOUS)
DECANTER SPIKE VIAL GLASS SM (MISCELLANEOUS) IMPLANT
DERMABOND ADVANCED (GAUZE/BANDAGES/DRESSINGS) ×1
DERMABOND ADVANCED .7 DNX12 (GAUZE/BANDAGES/DRESSINGS) ×1 IMPLANT
DRAPE C-ARM 42X72 X-RAY (DRAPES) ×2 IMPLANT
DRAPE LAPAROTOMY T 102X78X121 (DRAPES) ×2 IMPLANT
DRAPE UTILITY XL STRL (DRAPES) ×2 IMPLANT
ELECT REM PT RETURN 9FT ADLT (ELECTROSURGICAL) ×2
ELECTRODE REM PT RTRN 9FT ADLT (ELECTROSURGICAL) ×1 IMPLANT
GAUZE SPONGE 4X4 12PLY STRL LF (GAUZE/BANDAGES/DRESSINGS) ×4 IMPLANT
GLOVE BIO SURGEON STRL SZ 6.5 (GLOVE) ×2 IMPLANT
GLOVE BIOGEL PI IND STRL 7.0 (GLOVE) ×2 IMPLANT
GLOVE BIOGEL PI INDICATOR 7.0 (GLOVE) ×2
GLOVE SURG SIGNA 7.5 PF LTX (GLOVE) ×2 IMPLANT
GOWN STRL REUS W/ TWL LRG LVL3 (GOWN DISPOSABLE) ×1 IMPLANT
GOWN STRL REUS W/ TWL XL LVL3 (GOWN DISPOSABLE) ×1 IMPLANT
GOWN STRL REUS W/TWL LRG LVL3 (GOWN DISPOSABLE) ×1
GOWN STRL REUS W/TWL XL LVL3 (GOWN DISPOSABLE) ×1
IV CATH AUTO 14GX1.75 SAFE ORG (IV SOLUTION) IMPLANT
IV CATH PLACEMENT UNIT 16 GA (IV SOLUTION) IMPLANT
IV KIT MINILOC 20X1 SAFETY (NEEDLE) IMPLANT
KIT CVR 48X5XPRB PLUP LF (MISCELLANEOUS) IMPLANT
KIT PORT POWER 8FR ISP CVUE (Catheter) ×2 IMPLANT
NEEDLE HYPO 25X1 1.5 SAFETY (NEEDLE) ×2 IMPLANT
PACK BASIN DAY SURGERY FS (CUSTOM PROCEDURE TRAY) ×2 IMPLANT
PAD CLEANER CAUTERY TIP 5X5 (MISCELLANEOUS) ×1
PENCIL BUTTON HOLSTER BLD 10FT (ELECTRODE) ×2 IMPLANT
SET SHEATH INTRODUCER 10FR (MISCELLANEOUS) IMPLANT
SHEATH COOK PEEL AWAY SET 9F (SHEATH) IMPLANT
SLEEVE SCD COMPRESS KNEE MED (MISCELLANEOUS) ×2 IMPLANT
STRIP CLOSURE SKIN 1/4X4 (GAUZE/BANDAGES/DRESSINGS) ×2 IMPLANT
SUT ETHILON 2 0 FS 18 (SUTURE) IMPLANT
SUT ETHILON 3 0 PS 1 (SUTURE) IMPLANT
SUT MNCRL AB 4-0 PS2 18 (SUTURE) ×2 IMPLANT
SUT VICRYL 3-0 CR8 SH (SUTURE) ×2 IMPLANT
SYR 5ML LUER SLIP (SYRINGE) ×2 IMPLANT
SYR CONTROL 10ML LL (SYRINGE) ×2 IMPLANT
TOWEL OR 17X24 6PK STRL BLUE (TOWEL DISPOSABLE) ×2 IMPLANT
TOWEL OR NON WOVEN STRL DISP B (DISPOSABLE) ×2 IMPLANT

## 2016-05-08 NOTE — Anesthesia Procedure Notes (Signed)
Procedure Name: LMA Insertion Date/Time: 05/08/2016 10:53 AM Performed by: Bufford Spikes Pre-anesthesia Checklist: Patient identified, Emergency Drugs available, Suction available and Patient being monitored Patient Re-evaluated:Patient Re-evaluated prior to inductionOxygen Delivery Method: Circle system utilized Preoxygenation: Pre-oxygenation with 100% oxygen Intubation Type: IV induction Ventilation: Mask ventilation without difficulty LMA: LMA inserted LMA Size: 4.0 Number of attempts: 1 Placement Confirmation: positive ETCO2 Tube secured with: Tape Dental Injury: Teeth and Oropharynx as per pre-operative assessment

## 2016-05-08 NOTE — Anesthesia Preprocedure Evaluation (Signed)
Anesthesia Evaluation  Patient identified by MRN, date of birth, ID band Patient awake    Reviewed: Allergy & Precautions, H&P , NPO status , Patient's Chart, lab work & pertinent test results, reviewed documented beta blocker date and time   Airway Mallampati: II  TM Distance: >3 FB Neck ROM: Full    Dental no notable dental hx. (+) Teeth Intact, Dental Advisory Given   Pulmonary asthma , former smoker,    Pulmonary exam normal breath sounds clear to auscultation       Cardiovascular Exercise Tolerance: Good hypertension, Pt. on medications and Pt. on home beta blockers  Rhythm:Regular Rate:Normal     Neuro/Psych negative neurological ROS  negative psych ROS   GI/Hepatic negative GI ROS, Neg liver ROS,   Endo/Other  diabetes, Type 2, Oral Hypoglycemic AgentsMorbid obesity  Renal/GU negative Renal ROS  negative genitourinary   Musculoskeletal  (+) Arthritis , Osteoarthritis,    Abdominal   Peds  Hematology negative hematology ROS (+)   Anesthesia Other Findings   Reproductive/Obstetrics negative OB ROS                             Anesthesia Physical  Anesthesia Plan  ASA: III  Anesthesia Plan: General   Post-op Pain Management:    Induction: Intravenous  Airway Management Planned: LMA  Additional Equipment:   Intra-op Plan:   Post-operative Plan:   Informed Consent: I have reviewed the patients History and Physical, chart, labs and discussed the procedure including the risks, benefits and alternatives for the proposed anesthesia with the patient or authorized representative who has indicated his/her understanding and acceptance.   Dental advisory given  Plan Discussed with: CRNA  Anesthesia Plan Comments:         Anesthesia Quick Evaluation

## 2016-05-08 NOTE — Discharge Instructions (Signed)
°  Post Anesthesia Home Care Instructions  Activity: Get plenty of rest for the remainder of the day. A responsible individual must stay with you for 24 hours following the procedure.  For the next 24 hours, DO NOT: -Drive a car -Paediatric nurse -Drink alcoholic beverages -Take any medication unless instructed by your physician -Make any legal decisions or sign important papers.  Meals: Start with liquid foods such as gelatin or soup. Progress to regular foods as tolerated. Avoid greasy, spicy, heavy foods. If nausea and/or vomiting occur, drink only clear liquids until the nausea and/or vomiting subsides. Call your physician if vomiting continues.  Special Instructions/Symptoms: Your throat may feel dry or sore from the anesthesia or the breathing tube placed in your throat during surgery. If this causes discomfort, gargle with warm salt water. The discomfort should disappear within 24 hours.  If you had a scopolamine patch placed behind your ear for the management of post- operative nausea and/or vomiting:  1. The medication in the patch is effective for 72 hours, after which it should be removed.  Wrap patch in a tissue and discard in the trash. Wash hands thoroughly with soap and water. 2. You may remove the patch earlier than 72 hours if you experience unpleasant side effects which may include dry mouth, dizziness or visual disturbances. 3. Avoid touching the patch. Wash your hands with soap and water after contact with the patch.   [facility] - DISCHARGE INSTRUCTIONS TO PATIENT  Activity:  Driving - May drive tomorrow, if doing well   Lifting - No lifting more than 15 pounds for 3 days, then no limit  Wound Care:   Leave incisions dry for 2 days, then may shower  Diet:  As tolerated  Follow up appointment:  Call Dr. Pollie Friar office [facility]) at [phone] for an appointment in 2 months.  Medications and dosages:  Resume your home  medications.  You have a prescription for:  EMLA cream - to be applied about 30 prior to chemotx. (Apply to skin over port, then cover with plastic wrap to allow for clothing/activity)  Call Dr. Lucia Gaskins or his office  [phone]) if you have:  Temperature greater than 100.4,  Persistent nausea and vomiting,  Severe uncontrolled pain,  Redness, tenderness, or signs of infection (pain, swelling, redness, odor or green/yellow discharge around the site),  Difficulty breathing, headache or visual disturbances,  Any other questions or concerns you may have after discharge.  In an emergency, call 911 or go to an Emergency Department at a nearby hospital.

## 2016-05-08 NOTE — Interval H&P Note (Signed)
History and Physical Interval Note:  05/08/2016 10:42 AM  Brandi Dickson  has presented today for surgery, with the diagnosis of LEFT BREAST CANCER  The various methods of treatment have been discussed with the patient and family.  Her husband is here with her.  After consideration of risks, benefits and other options for treatment, the patient has consented to  Procedure(s): INSERTION PORT-A-CATH WITH [place] (N/A) as a surgical intervention .  The patient's history has been reviewed, patient examined, no change in status, stable for surgery.  I have reviewed the patient's chart and labs.  Questions were answered to the patient's satisfaction.     Aleatha Taite H

## 2016-05-08 NOTE — Op Note (Signed)
05/08/2016  12:03 PM  PATIENT:  Brandi Dickson, 56 y.o., female MRN: [id number] DOB: Apr 29, 1960  PREOP DIAGNOSIS:  LEFT BREAST CANCER, anticipate chemotherapy  POSTOP DIAGNOSIS:   Left Breast cancer, anticipate chemotherapy  PROCEDURE:   Procedure(s):   INSERTION PORT-A-CATH WITH [place]  SURGEON:   [name], M.D.  ANESTHESIA:   general  No anesthesia staff entered.  General  EBL:  minimal  ml  COUNTS CORRECT:  YES  INDICATIONS FOR PROCEDURE:  Brandi Dickson is a 56 y.o. (DOB: 1960-10-21) white female whose primary care physician is MEYERS, Annie Main, MD and comes for power port placement for the treatment of left breast cancer cancer.  [name] and Sondra Come is her treating oncologist.   Her tumor is Her2Neu positive and large - so our plans are neoadjuvant chemotx.   The indications and risks of the surgery were explained to the patient.  The risks include, but are not limited to, infection, bleeding, pneumothorax, nerve injury, and thrombosis of the vein.  OPERATIVE NOTE:  The patient was taken to Room #6 at [facility].  Anesthesia was provided by No anesthesia staff entered..  At the beginning of the operation, the patient was given 2 gm Ancef, had a roll placed under her back, and had the upper chest/neck prepped with Chloroprep and draped.   A time out was held and the surgery checklist reviewed.   The patient was placed in Trendelenburg position.  The right internal jugular vein was accessed with a 16 gauge needle and a guide wire threaded through the needle into the vein.  The position of the wire was checked with fluoroscopy.   I then developed a pocket in the upper inner aspect of the right chest for the port reservoir.  I used the [employer] for venous access.  The reservoir was sewn in place with a 3-0 Vicryl suture.  The reservoir had been flushed with dilute (10 units/cc) heparin.   I then passed the silastic tubing from the reservoir incision to the  subclavian stick site and used the 8 French introducer to pass it into the vein.  The tip of the silastic catheter was position at the junction of the SVC and the right atrium under fluoroscopy.  The silastic catheter was then attached to the port with the bayonet device.     The entire port and tubing were checked with fluoroscopy and then the port was flushed with 4 cc of concentrated heparin (100 units/cc).   The wounds were then closed with 3-0 vicryl subcutaneous sutures and the skin closed with a 4-0 Monocryl suture.  The skin was painted with DermaBond.   The patient was transferred to the recovery room in good condition.  The sponge and needle count were correct at the end of the case.  A CXR is ordered for port placement and pending at the time of this note.  [name], MD, [facility] Pager: [phone] Office phone:  [phone]

## 2016-05-08 NOTE — Anesthesia Postprocedure Evaluation (Signed)
Anesthesia Post Note  Patient: Brandi Dickson  Procedure(s) Performed: Procedure(s) (LRB): INSERTION PORT-A-CATH WITH [place] (Right)  Patient location during evaluation: PACU Anesthesia Type: General Level of consciousness: awake and alert Pain management: pain level controlled Vital Signs Assessment: post-procedure vital signs reviewed and stable Respiratory status: spontaneous breathing, nonlabored ventilation and respiratory function stable Cardiovascular status: blood pressure returned to baseline and stable Postop Assessment: no signs of nausea or vomiting Anesthetic complications: no       Last Vitals:  Vitals:   05/08/16 1303 05/08/16 1321  BP:  129/81  Pulse: (!) 58 61  Resp: 16 18  Temp:  36.4 C    Last Pain:  Vitals:   05/08/16 1321  TempSrc:   PainSc: [place]

## 2016-05-08 NOTE — Transfer of Care (Signed)
Immediate Anesthesia Transfer of Care Note  Patient: Brandi Dickson  Procedure(s) Performed: Procedure(s): INSERTION PORT-A-CATH WITH [place] (Right)  Patient Location: PACU  Anesthesia Type:General  Level of Consciousness: oriented and sedated  Airway & Oxygen Therapy: Patient Spontanous Breathing and Patient connected to face mask oxygen  Post-op Assessment: Report given to RN and Post -op Vital signs reviewed and stable  Post vital signs: Reviewed and stable  Last Vitals:  Vitals:   05/08/16 0927  BP: (!) 142/85  Pulse: 69  Resp: 18  Temp: 36.9 C    Last Pain:  Vitals:   05/08/16 0927  TempSrc: Oral         Complications: No apparent anesthesia complications

## 2016-05-09 ENCOUNTER — Encounter (HOSPITAL_COMMUNITY): Payer: Self-pay

## 2016-05-09 ENCOUNTER — Ambulatory Visit (HOSPITAL_COMMUNITY)
Admission: RE | Admit: 2016-05-09 | Discharge: 2016-05-09 | Disposition: A | Discharge status: Home / Self Care | Payer: 59 | Source: Ambulatory Visit | Attending: Internal Medicine | Admitting: Internal Medicine

## 2016-05-09 ENCOUNTER — Ambulatory Visit (HOSPITAL_BASED_OUTPATIENT_CLINIC_OR_DEPARTMENT_OTHER)
Admission: RE | Admit: 2016-05-09 | Discharge: 2016-05-09 | Disposition: A | Discharge status: Home / Self Care | Payer: 59 | Source: Ambulatory Visit | Attending: Cardiology | Admitting: Cardiology

## 2016-05-09 ENCOUNTER — Encounter: Payer: Self-pay | Admitting: *Deleted

## 2016-05-09 VITALS — BP 140/80 | HR 64 | Wt 217.5 lb

## 2016-05-09 DIAGNOSIS — Z79899 Other long term (current) drug therapy: Secondary | ICD-10-CM | POA: Insufficient documentation

## 2016-05-09 DIAGNOSIS — Z7984 Long term (current) use of oral hypoglycemic drugs: Secondary | ICD-10-CM | POA: Diagnosis not present

## 2016-05-09 DIAGNOSIS — C50412 Malignant neoplasm of upper-outer quadrant of left female breast: Secondary | ICD-10-CM | POA: Insufficient documentation

## 2016-05-09 DIAGNOSIS — Z87891 Personal history of nicotine dependence: Secondary | ICD-10-CM | POA: Diagnosis not present

## 2016-05-09 DIAGNOSIS — Z803 Family history of malignant neoplasm of breast: Secondary | ICD-10-CM | POA: Insufficient documentation

## 2016-05-09 DIAGNOSIS — K5190 Ulcerative colitis, unspecified, without complications: Secondary | ICD-10-CM | POA: Insufficient documentation

## 2016-05-09 DIAGNOSIS — Z808 Family history of malignant neoplasm of other organs or systems: Secondary | ICD-10-CM | POA: Diagnosis not present

## 2016-05-09 DIAGNOSIS — Z7982 Long term (current) use of aspirin: Secondary | ICD-10-CM | POA: Insufficient documentation

## 2016-05-09 DIAGNOSIS — E119 Type 2 diabetes mellitus without complications: Secondary | ICD-10-CM | POA: Diagnosis not present

## 2016-05-09 DIAGNOSIS — Z800 Family history of malignant neoplasm of digestive organs: Secondary | ICD-10-CM | POA: Insufficient documentation

## 2016-05-09 DIAGNOSIS — Z171 Estrogen receptor negative status [ER-]: Secondary | ICD-10-CM | POA: Insufficient documentation

## 2016-05-09 DIAGNOSIS — I10 Essential (primary) hypertension: Secondary | ICD-10-CM | POA: Diagnosis not present

## 2016-05-09 NOTE — Patient Instructions (Signed)
We will contact you in 3 months to schedule your next appointment and echocardiogram  

## 2016-05-09 NOTE — Addendum Note (Signed)
Addended by: Truitt Merle on: 05/09/2016 08:00 AM   Modules accepted: Orders

## 2016-05-09 NOTE — Progress Notes (Signed)
  Echocardiogram 2D Echocardiogram has been performed.  Brandi Dickson 05/09/2016, 12:08 PM

## 2016-05-09 NOTE — Progress Notes (Signed)
[place]  [phone] [phone]  Clinic Follow Up Note   Patient Care Team: Orpah Melter, MD as PCP - General (Family Medicine) [name], MD as Consulting Physician (General Surgery) Truitt Merle, MD as Consulting Physician (Hematology) Gery Pray, MD as Consulting Physician (Radiation Oncology) Ladene Artist, MD as Consulting Physician (Gastroenterology) 05/11/2016  CHIEF COMPLAINTS:  Follow up left breast cancer  Oncology History   Cancer Staging Breast cancer of upper-outer quadrant of left female breast [facility]) Staging form: Breast, AJCC 8th Edition - Clinical stage from 04/25/2016: Stage IIA (cT2, cN0, cM0, G3, ER: Negative, PR: Negative, HER2: Positive) - Signed by Truitt Merle, MD on 05/02/2016       Breast cancer of upper-outer quadrant of left female breast ([place])   04/24/2016 Mammogram    Category B breasts with a new irregular mass in the UOQ left breast middle depth. Ultrasound revealed a 3.9 cm mass in the 1:00 position. The left axilla was negative.       04/25/2016 Initial Biopsy    Biopsy of the left breast showed grade 3 invasive ductal carcinoma, DCIS, and lymphovascular invasion was present.      04/25/2016 Receptors her2    ER 0% negative, PR 0% negative, HER2 positive, Ki67 30%      05/02/2016 Initial Diagnosis    Breast cancer of upper-outer quadrant of left female breast ([place])     05/07/2016 Imaging    MRI of the bilateral breast 05/07/16 IMPRESSION: Lobulated enhancing mass (4.0 x 2.7 x 3.1 cm) in the upper-outer quadrant of the left breast corresponding with the recently diagnosed invasive mammary carcinoma. Linear enhancement extends 2.6 cm posterior to the mass worrisome for ductal carcinoma in-situ.      05/09/2016 Echocardiogram    Echo 05/09/16 -LF EF: 55-60%      05/11/2016 -  Neo-Adjuvant Chemotherapy    Docetaxel, Carboplatin, Herceptin and pejeta (TCHP) every 3 weeks, for total of 6 cycles, followed by  Herceptin and perjeta maintenance therapy to complete 1 year treatment.       HISTORY OF PRESENTING ILLNESS:  Brandi Dickson 56 y.o. female is here because of a new diagnosis of left breast cancer. She is accompanied by her husband to our multidisciplinary breast clinic today.  The patient presented with a palpable left breast lump approximately 2 weeks ago. Bilateral diagnostic mammogram on 04/24/16 showed Category B breasts with a new irregular mass in the UOQ left breast middle depth. Ultrasound performed on 04/24/16 revealed a 3.9 cm mass in the 1:00 position. The left axilla was negative.  Biopsy of the left breast on 04/25/16 showed grade 3 invasive ductal carcinoma, DCIS, and lymphovascular invasion was present (ER 0% negative, PR 0% negative, HER2 positive, Ki67 30%).  She denies tenderness of the biopsied area. Reports minor dimpling. The patient is 8 weeks out from a left hip replacement. She had a right hip replacement 2010 years ago.  The patient and her husband present today in multidisciplinary breast clinic to discuss treatment options for the management of her disease.  The patient is taking Neurontin for neuropathy in her feet from diabetes. She has been diagnosed for diabetes for the past 4 years. She states she only has neuropathy at night and take 2 Neurontin at night and then has no symptoms.  GYN HISTORY  Menarchal: 9 LMP: Complete hysterectomy for uterine fibroids ~ 2009. She was bleeding too much with her last menstrual cycle lasting 6 weeks. No issues with  menopause. Contraceptive: no  HRT: No GP: G1P0  CURRENT THERAPY: Docetaxel, Carboplatin, Herceptin and pejeta (TCHP) every 3 weeks, for total of 6 cycles, followed by Herceptin and perjeta maintenance therapy to complete 1 year treatment starting 05/11/16  INTERVAL HISTORY: Brandi Dickson returns for follow up with her husband and first cycle Docetaxel, Carboplatin, Herceptin and pejeta (TCHP). She is anxious  about chemotherapy. She had a port placed on 05/08/16. She has some pain in that area and is taking Tramadol for this with good relief. She denies any other complaints at this time.  MEDICAL HISTORY:  Past Medical History:  Diagnosis Date  . Arthritis   . Asthma    triggered with [name] perfumes and cigarette smoke  . Cancer ([place])   . Colitis   . Diabetes mellitus without complication ([place])   . Hypertension   . Neuropathy ([place])     SURGICAL HISTORY: Past Surgical History:  Procedure Laterality Date  . ABDOMINAL HYSTERECTOMY    . DILATION AND CURETTAGE OF UTERUS    . PORTACATH PLACEMENT Right 05/08/2016   Procedure: INSERTION PORT-A-CATH WITH [place];  Surgeon: [name], MD;  Location: [facility];  Service: General;  Laterality: Right;  . TONSILLECTOMY    . TOTAL HIP ARTHROPLASTY Right   . TOTAL HIP ARTHROPLASTY Left 03/06/2016   Procedure: LEFT TOTAL HIP ARTHROPLASTY ANTERIOR APPROACH;  Surgeon: [name], MD;  Location: [facility];  Service: Orthopedics;  Laterality: Left;    SOCIAL HISTORY: Social History   Social History  . Marital status: Married    Spouse name: N/A  . Number of children: N/A  . Years of education: N/A   Occupational History  . Not on file.   Social History Main Topics  . Smoking status: Former Smoker    Packs/day: 1.00    Years: 29.00    Types: Cigarettes    Quit date: 10/27/2002  . Smokeless tobacco: Never Used  . Alcohol use No  . Drug use: No  . Sexual activity: Yes    Birth control/ protection: Surgical   Other Topics Concern  . Not on file   Social History Narrative  . No narrative on file    FAMILY HISTORY: Family History  Problem Relation Age of Onset  . Colon cancer Father   . Stomach cancer Paternal Uncle   . Stomach cancer Paternal Uncle   . Melanoma Brother   . Thyroid cancer Brother   . Breast cancer Maternal Aunt   . Breast cancer Maternal Aunt   . Breast cancer Cousin     ALLERGIES:  is allergic  to lisinopril and augmentin [amoxicillin-pot clavulanate].  MEDICATIONS:  Current Outpatient Prescriptions  Medication Sig Dispense Refill  . ALBUTEROL IN Inhale into the lungs.    . ASPIRIN 81 PO Take by mouth.    . dexamethasone (DECADRON) 4 MG tablet Take 1 tablet (4 mg total) by mouth daily. Start the day before Taxotere. Then again the day after chemo for 3 days. 30 tablet 1  . Esomeprazole Magnesium (NEXIUM 24HR) 20 MG TBEC Take 20 mg by mouth at bedtime as needed (For heartburn or acid reflux.).     [name] Kitchen ferrous sulfate (FERROUSUL) 325 (65 FE) MG tablet Take 1 tablet (325 mg total) by mouth 3 (three) times daily with meals.    . gabapentin (NEURONTIN) 100 MG capsule Take 200 mg by mouth at bedtime.     . hydrocortisone (ANUSOL-HC) 25 MG suppository Place 1 suppository (25 mg  total) rectally 2 (two) times daily. (Patient taking differently: Place 25 mg rectally 2 (two) times daily as needed for hemorrhoids or itching. ) 20 suppository 2  . lidocaine-prilocaine (EMLA) cream Apply 1 application to skin 1.5 to 2 hrs before use.  Cover to secure cream with plastic wrap. 30 g PRN  . loperamide (IMODIUM) 1 MG/5ML solution Take 3 mg by mouth as needed for diarrhea or loose stools.    [name] Kitchen LORazepam (ATIVAN) 0.5 MG tablet Take 1 tablet (0.5 mg total) by mouth once as needed for anxiety. 2 tablet 0  . Mesalamine (ASACOL HD) 800 MG TBEC Take 1 tablet (800 mg total) by mouth 3 (three) times daily. 90 tablet 5  . metFORMIN (GLUCOPHAGE-XR) 500 MG 24 hr tablet Take 1,000 mg by mouth at bedtime.    . methocarbamol (ROBAXIN) 500 MG tablet Take 1 tablet (500 mg total) by mouth every 6 (six) hours as needed for muscle spasms. 40 tablet 0  . metoprolol (LOPRESSOR) 50 MG tablet Take 50 mg by mouth 2 (two) times daily.    . niacin 250 MG tablet Take 250 mg by mouth daily before breakfast.    . Specialty Vitamins Products (VITAMINS FOR THE HAIR) TABS Take 2 tablets by mouth 2 (two) times daily.    . traMADol (ULTRAM)  50 MG tablet Take 1-2 tablets (50-100 mg total) by mouth every 6 (six) hours as needed. 40 tablet 0  . triamterene-hydrochlorothiazide (MAXZIDE) 75-50 MG per tablet Take 0.5 tablets by mouth daily.     . ondansetron (ZOFRAN) 8 MG tablet Take 1 tablet (8 mg total) by mouth 2 (two) times daily as needed for refractory nausea / vomiting. Start on day 3 after chemo. (Patient not taking: Reported on 05/09/2016) 30 tablet 1  . prochlorperazine (COMPAZINE) 10 MG tablet Take 1 tablet (10 mg total) by mouth every 6 (six) hours as needed (Nausea or vomiting). (Patient not taking: Reported on 05/09/2016) 30 tablet 1   No current facility-administered medications for this visit.    Facility-Administered Medications Ordered in Other Visits  Medication Dose Route Frequency Provider Last Rate Last Dose  . CARBOplatin (PARAPLATIN) 720 mg in sodium chloride 0.9 % 250 mL chemo infusion  720 mg Intravenous Once Truitt Merle, MD      . dexamethasone (DECADRON) injection 10 mg  10 mg Intravenous Once Truitt Merle, MD      . DOCEtaxel (TAXOTERE) 160 mg in sodium chloride 0.9 % 250 mL chemo infusion  75 mg/m2 (Treatment Plan Recorded) Intravenous Once Truitt Merle, MD      . heparin lock flush 100 unit/mL  500 Units Intracatheter Once PRN Truitt Merle, MD      . palonosetron (ALOXI) injection 0.25 mg  0.25 mg Intravenous Once Truitt Merle, MD      . pertuzumab (PERJETA) 840 mg in sodium chloride 0.9 % 250 mL chemo infusion  840 mg Intravenous Once Truitt Merle, MD      . sodium chloride flush (NS) 0.9 % injection 10 mL  10 mL Intracatheter PRN Truitt Merle, MD      . trastuzumab (HERCEPTIN) 777 mg in sodium chloride 0.9 % 250 mL chemo infusion  8 mg/kg (Treatment Plan Recorded) Intravenous Once Truitt Merle, MD        REVIEW OF SYSTEMS:   Constitutional: Denies fevers, chills or abnormal night sweats Eyes: Denies blurriness of vision, double vision or watery eyes Ears, nose, mouth, throat, and face: Denies mucositis or sore throat Respiratory:  Denies  cough, dyspnea or wheezes Cardiovascular: Denies palpitation, chest discomfort or lower extremity swelling Gastrointestinal:  Denies nausea, heartburn or change in bowel habits Skin: Denies abnormal skin rashes Lymphatics: Denies new lymphadenopathy or easy bruising Neurological: (+) Foot numbness from diabetic neuropathy. (+) Right chest pain/discomfort from port a cath placement. Behavioral/Psych: Mood is stable, no new changes  All other systems were reviewed with the patient and are negative.  PHYSICAL EXAMINATION: ECOG PERFORMANCE STATUS: 1 - Symptomatic but completely ambulatory  Vitals:   05/11/16 0851  BP: (!) 144/58  Pulse: 72  Resp: 18  Temp: 98.5 F (36.9 C)   Filed Weights   05/11/16 0851  Weight: 213 lb 12.8 oz (97 kg)    GENERAL:alert, no distress and comfortable SKIN: skin color, texture, turgor are normal, no rashes or significant lesions EYES: normal, conjunctiva are pink and non-injected, sclera clear OROPHARYNX:no exudate, no erythema and lips, buccal mucosa, and tongue normal  NECK: supple, thyroid normal size, non-tender, without nodularity LYMPH:  no palpable lymphadenopathy in the cervical, axillary or inguinal LUNGS: clear to auscultation and percussion with normal breathing effort HEART: regular rate & rhythm and no murmurs and no lower extremity edema ABDOMEN:abdomen soft, non-tender and normal bowel sounds Musculoskeletal:no cyanosis of digits and no clubbing  PSYCH: alert & oriented x 3 with fluent speech NEURO: no focal motor/sensory deficits BREAST: Breast inspection showed them to be symmetrical with no nipple discharge. Left breast exam showed UOQ 4 x 2.5 cm palpable mass, non-tender, no palpable mass in the right breast and bilateral axilla.  LABORATORY DATA:  I have reviewed the data as listed CBC Latest Ref Rng & Units 05/10/2016 05/08/2016 03/07/2016  WBC 3.9 - 10.3 10e3/uL 7.6 - 9.7  Hemoglobin 11.6 - 15.9 g/dL 12.6 15.0 10.4(L)    Hematocrit 34.8 - 46.6 % 39.2 44.0 32.7(L)  Platelets 145 - 400 10e3/uL 276 - 218   CMP Latest Ref Rng & Units 05/10/2016 05/08/2016 03/07/2016  Glucose 70 - 140 mg/dl 92 107(H) 107(H)  BUN 7.0 - 26.0 mg/dL 12._0 Creatinine 0.6 - 1.1 mg/dL 0.8 1.00 0.76  Sodium 136 - 145 mEq/L 140 141 138  Potassium 3.5 - 5.1 mEq/L 3.9 3.7 4.7  Chloride 101 - 111 mmol/L - 102 105  CO2 22 - 29 mEq/L 29 - 27  Calcium 8.4 - 10.4 mg/dL 9.3 - 8.6(L)  Total Protein 6.4 - 8.3 g/dL 7.2 - -  Total Bilirubin 0.20 - 1.20 mg/dL 0.39 - -  Alkaline Phos 40 - 150 U/L 82 - -  AST 5 - 34 U/L 17 - -  ALT 0 - 55 U/L 17 - -   PATHOLOGY REPORT:   ADDITIONAL INFORMATION: 04/25/16 PROGNOSTIC INDICATORS Results: IMMUNOHISTOCHEMICAL AND MORPHOMETRIC ANALYSIS PERFORMED MANUALLY Estrogen Receptor: 0%, NEGATIVE Progesterone Receptor: 0%, NEGATIVE Proliferation Marker Ki67: 30% COMMENT: The negative hormone receptor study(ies) in this case has no internal positive control. REFERENCE RANGE ESTROGEN RECEPTOR NEGATIVE 0% POSITIVE =>1% REFERENCE RANGE PROGESTERONE RECEPTOR NEGATIVE 0% POSITIVE =>1% All controls stained appropriately Enid Cutter MD Pathologist, Electronic Signature ( Signed 05/01/2016) FLUORESCENCE IN-SITU HYBRIDIZATION Results: HER2 - **POSITIVE** RATIO OF HER2/CEP17 SIGNALS 2.25 AVERAGE HER2 COPY NUMBER PER CELL 8.45 1 of 3 FINAL for Radle, Marlis P ([id number]) ADDITIONAL INFORMATION:(continued) Reference Range: NEGATIVE HER2/CEP17 Ratio <2.0 and average HER2 copy number <4.0 EQUIVOCAL HER2/CEP17 Ratio <2.0 and average HER2 copy number 4.0 and <6.0 POSITIVE HER2/CEP17 Ratio >=2.0 or <2.0 and average HER2 copy number >=6.0 Enid Cutter MD Pathologist, Electronic Signature (  Signed 04/30/2016) FINAL DIAGNOSIS Diagnosis Breast, left, needle core biopsy - INVASIVE DUCTAL CARCINOMA, SEE COMMENT. - DUCTAL CARCINOMA IN SITU. - LYMPHOVASCULAR INVASION PRESENT. Microscopic Comment The  carcinoma appears grade 3 with focal squamous differentiation. Prognostic markers will be ordered. Dr. Lyndon Code has reviewed the case. The case was called to Dr. Isaiah Blakes on 04/26/2016. Vicente Males MD Pathologist, Electronic Signature (Case signed 04/26/2016)  RADIOGRAPHIC STUDIES: I have personally reviewed the radiological images as listed and agreed with the findings in the report.  See onc history  Echo 05/09/16 Study Conclusions -LF EF: 55-60% - Left ventricle: The cavity size was normal. There was mild focal   basal hypertrophy of the septum. Indeterminant diastolic   function. Systolic function was normal. The estimated ejection   fraction was in the range of 55% to 60%. Wall motion was normal;   there were no regional wall motion abnormalities. GLS abnormal at   -13.3%, poor images however.  ASSESSMENT & PLAN: 56 y.o. post-menopausal Caucasian female with a self palpated left breast mass.  1. Breast cancer of upper-outer quadrant of left breast, invasive ductal carcinoma,  stage IIA (cT2N0M0) grade 3, ER-, PR-, HER2 amplified -We reviewed the patient's imaging and pathology. -We reviewed her staging and biology of her breast cancer  -We discussed that surgical resection is the definitive treatment for breast cancer, she was seen by breast surgeon Dr. Lucia Gaskins today, lumpectomy versus mastectomy were discussed with patient. -We discussed HER2 positive breast cancers total approximately 15% of breast cancers and happens to be more aggressive than HER2 negative cancers, especially ER and PR negative disease, she has high likelihood of cancer recurrence after complete surgical resection.  -I recommend neoadjuvant chemotherapy TCHP (docetaxel, carboplatin, Herceptin and pejeta) every 3 weeks for 6 cycles, followed by maintenance Herceptin and pejeta to complete 1 year therapy --Chemotherapy consent: Side effects including but does not not limited to, fatigue, nausea, vomiting, diarrhea, hair  loss, neuropathy, fluid retention, renal and kidney dysfunction, neutropenic fever, needed for blood transfusion, bleeding, cardiomyopathy and heart failure, were discussed with patient in great detail. She agrees to proceed. -Her baseline echo was normal, she was seen by cardiologist Dr. Benjamine Mola -MRI of the bilateral breast 05/07/16 showed a lobulated enhancing mass (4.0 x 2.7 x 3.1 cm) in the upper-outer quadrant of the left breast corresponding with the recently diagnosed invasive mammary carcinoma. Linear enhancement extends 2.6 cm posterior to the mass worrisome for ductal carcinoma in-situ. -The patient has two cats and I advised her to wear long sleeved clothing to prevent bites and scratches that could cause a possible infection while she is on chemotherapy. -Labs reviewed, adequate for 1st cycle TCHP TODAY, every 3 weeks.  2. Genetics -Given her strong family history of breast cancer, we recommend her to see genetic counseling to ruled out inheritable breast cancer syndrome. She agreed. -Genetic counseling scheduled 05/17/16.  3. Type 2 Diabetes mellitus, HTN -Managed by her PCP. -The patient has peripheral neuropathy in her feet from her diabetes. The patient is already on Neurontin with 200 mg at night. We discussed that chemotherapy may make her neuropathy worse. -Steroids will be given to reduce chemo side effects and I will reduce dexa to 78m daily to not affect her blood sugar much. -We'll monitor her blood glucose and blood pressure closely during her chemotherapy treatment.  4. Colitis -We discussed that chemotherapy would cause diarrhea and the patient's colitis may exacerbate during chemo  -She is not taking steroids for this. She is on  mesalamine, will follow up with Dr. Fuller Plan  -I advised the patient to keep herself adequatly hydrated and to take Imodium PRN.  5. Arthritis  -s/p b/l hip replacement, last surgery in February 2018.   PLAN -1st cycle neoadjuvant chemo TCHP  today. -Genetic counseling on 05/17/16 -Lab, f/u with APP and 2hr IVF in 1 week. -Lab, flushed, follow-up and second cycle TCHP in 3 weeks  No orders of the defined types were placed in this encounter.   All questions were answered. The patient knows to call the clinic with any problems, questions or concerns. I spent 20 minutes counseling the patient face to face. The total time spent in the appointment was 25 minutes and more than 50% was on counseling.     Truitt Merle, MD 05/11/2016   This document serves as a record of services personally performed by Truitt Merle, MD. It was created on her behalf by Darcus Austin, a trained medical scribe. The creation of this record is based on the scribe's personal observations and the provider's statements to them. This document has been checked and approved by the attending provider.

## 2016-05-10 ENCOUNTER — Other Ambulatory Visit: Payer: 59

## 2016-05-10 ENCOUNTER — Other Ambulatory Visit (HOSPITAL_BASED_OUTPATIENT_CLINIC_OR_DEPARTMENT_OTHER): Payer: 59

## 2016-05-10 ENCOUNTER — Telehealth: Payer: Self-pay | Admitting: *Deleted

## 2016-05-10 DIAGNOSIS — C50412 Malignant neoplasm of upper-outer quadrant of left female breast: Secondary | ICD-10-CM | POA: Diagnosis not present

## 2016-05-10 DIAGNOSIS — Z171 Estrogen receptor negative status [ER-]: Principal | ICD-10-CM

## 2016-05-10 LAB — CBC WITH DIFFERENTIAL/PLATELET
BASO%: 0.6 % (ref 0.0–2.0)
Basophils Absolute: 0 10*3/uL (ref 0.0–0.1)
EOS%: 2.6 % (ref 0.0–7.0)
Eosinophils Absolute: 0.2 10*3/uL (ref 0.0–0.5)
HEMATOCRIT: 39.2 % (ref 34.8–46.6)
HEMOGLOBIN: 12.6 g/dL (ref 11.6–15.9)
LYMPH#: 2.8 10*3/uL (ref 0.9–3.3)
LYMPH%: 36.7 % (ref 14.0–49.7)
MCH: 28.3 pg (ref 25.1–34.0)
MCHC: 32.3 g/dL (ref 31.5–36.0)
MCV: 87.5 fL (ref 79.5–101.0)
MONO#: 0.6 10*3/uL (ref 0.1–0.9)
MONO%: 7.3 % (ref 0.0–14.0)
NEUT#: 4 10*3/uL (ref 1.5–6.5)
NEUT%: 52.8 % (ref 38.4–76.8)
Platelets: 276 10*3/uL (ref 145–400)
RBC: 4.47 10*6/uL (ref 3.70–5.45)
RDW: 14.5 % (ref 11.2–14.5)
WBC: 7.6 10*3/uL (ref 3.9–10.3)

## 2016-05-10 LAB — COMPREHENSIVE METABOLIC PANEL
ALT: 17 U/L (ref 0–55)
AST: 17 U/L (ref 5–34)
Albumin: 3.3 g/dL — ABNORMAL LOW (ref 3.5–5.0)
Alkaline Phosphatase: 82 U/L (ref 40–150)
Anion Gap: 8 mEq/L (ref 3–11)
BUN: 12.8 mg/dL (ref 7.0–26.0)
CALCIUM: 9.3 mg/dL (ref 8.4–10.4)
CHLORIDE: 104 meq/L (ref 98–109)
CO2: 29 mEq/L (ref 22–29)
CREATININE: 0.8 mg/dL (ref 0.6–1.1)
EGFR: 80 mL/min/{1.73_m2} — ABNORMAL LOW (ref >90)
GLUCOSE: 92 mg/dL (ref 70–140)
POTASSIUM: 3.9 meq/L (ref 3.5–5.1)
SODIUM: 140 meq/L (ref 136–145)
Total Bilirubin: 0.39 mg/dL (ref 0.20–1.20)
Total Protein: 7.2 g/dL (ref 6.4–8.3)

## 2016-05-10 MED ORDER — LIDOCAINE-PRILOCAINE 2.5-2.5 % EX CREA: TOPICAL_CREAM | CUTANEOUS | 99 refills | Status: DC

## 2016-05-10 NOTE — Progress Notes (Signed)
Oncologist: [name]  56 yo referred by [name] with history of type II diabetes, HTN, ulcerative colitis and breast cancer presents for cardio-oncology evaluation.   Breast cancer was diagnosed 3/18, ER-/PR-/HER2+.  She will start docetaxel/carboplatin/Herceptin/Perjeta on 05/11/16 x 6 cycles.  Herceptin/Perjeta will continue for a year.  She will have surgery and radiation after chemotherapy.    At baseline, no exertional dyspnea or chest pain.  No history of cardiac disease. She is a nonsmoker.   Labs (2/18): K 4.7, creatinine 0.76  PMH: 1. Type II diabetes 2. HTN 3. Ulcerative colitis 4. Breast cancer: Diagnosed 3/18, ER-/PR-/HER2+.  She will start docetaxel/carboplatin/Herceptin/Perjeta on 05/11/16 x 6 cycles.  Herceptin/Perjeta will continue for a year.  She will have surgery and radiation after chemotherapy.   - Echo (4/18): EF 55-60%, GLS -13.3%, normal RV size and systolic function (technically difficult study).   Social History   Social History  . Marital status: Married    Spouse name: N/A  . Number of children: N/A  . Years of education: N/A   Occupational History  . Not on file.   Social History Main Topics  . Smoking status: Former Smoker    Packs/day: 1.00    Years: 29.00    Types: Cigarettes    Quit date: 10/27/2002  . Smokeless tobacco: Never Used  . Alcohol use No  . Drug use: No  . Sexual activity: Yes    Birth control/ protection: Surgical   Other Topics Concern  . Not on file   Social History Narrative  . No narrative on file   Family History  Problem Relation Age of Onset  . Colon cancer Father   . Stomach cancer Paternal Uncle   . Stomach cancer Paternal Uncle   . Melanoma Brother   . Thyroid cancer Brother   . Breast cancer Maternal Aunt   . Breast cancer Maternal Aunt   . Breast cancer Cousin    ROS: All systems reviewed and negative except as per HPI.   Current Outpatient Prescriptions  Medication Sig Dispense Refill  . ALBUTEROL IN  Inhale into the lungs.    . ASPIRIN 81 PO Take by mouth.    . dexamethasone (DECADRON) 4 MG tablet Take 1 tablet (4 mg total) by mouth daily. Start the day before Taxotere. Then again the day after chemo for 3 days. 30 tablet 1  . Esomeprazole Magnesium (NEXIUM 24HR) 20 MG TBEC Take 20 mg by mouth at bedtime as needed (For heartburn or acid reflux.).     [name] Kitchen ferrous sulfate (FERROUSUL) 325 (65 FE) MG tablet Take 1 tablet (325 mg total) by mouth 3 (three) times daily with meals.    . gabapentin (NEURONTIN) 100 MG capsule Take 200 mg by mouth at bedtime.     . hydrocortisone (ANUSOL-HC) 25 MG suppository Place 1 suppository (25 mg total) rectally 2 (two) times daily. (Patient taking differently: Place 25 mg rectally 2 (two) times daily as needed for hemorrhoids or itching. ) 20 suppository 2  . loperamide (IMODIUM) 1 MG/5ML solution Take 3 mg by mouth as needed for diarrhea or loose stools.    [name] Kitchen LORazepam (ATIVAN) 0.5 MG tablet Take 1 tablet (0.5 mg total) by mouth once as needed for anxiety. 2 tablet 0  . Mesalamine (ASACOL HD) 800 MG TBEC Take 1 tablet (800 mg total) by mouth 3 (three) times daily. 90 tablet 5  . metFORMIN (GLUCOPHAGE-XR) 500 MG 24 hr tablet Take 1,000 mg by mouth at bedtime.    [name] Kitchen  methocarbamol (ROBAXIN) 500 MG tablet Take 1 tablet (500 mg total) by mouth every 6 (six) hours as needed for muscle spasms. 40 tablet 0  . metoprolol (LOPRESSOR) 50 MG tablet Take 50 mg by mouth 2 (two) times daily.    . niacin 250 MG tablet Take 250 mg by mouth daily before breakfast.    . Specialty Vitamins Products (VITAMINS FOR THE HAIR) TABS Take 2 tablets by mouth 2 (two) times daily.    [name] Kitchen triamterene-hydrochlorothiazide (MAXZIDE) 75-50 MG per tablet Take 0.5 tablets by mouth daily.     [name] Kitchen lidocaine-prilocaine (EMLA) cream Apply 1 application to skin 1.5 to 2 hrs before use.  Cover to secure cream with plastic wrap. 30 g PRN  . ondansetron (ZOFRAN) 8 MG tablet Take 1 tablet (8 mg total) by mouth 2  (two) times daily as needed for refractory nausea / vomiting. Start on day 3 after chemo. (Patient not taking: Reported on 05/09/2016) 30 tablet 1  . prochlorperazine (COMPAZINE) 10 MG tablet Take 1 tablet (10 mg total) by mouth every 6 (six) hours as needed (Nausea or vomiting). (Patient not taking: Reported on 05/09/2016) 30 tablet 1  . traMADol (ULTRAM) 50 MG tablet Take 1-2 tablets (50-100 mg total) by mouth every 6 (six) hours as needed. (Patient not taking: Reported on 05/09/2016) 40 tablet 0   No current facility-administered medications for this encounter.      BP 140/80   Pulse 64   Wt 217 lb 8 oz (98.7 kg)   SpO2 100%   BMI 37.33 kg/m  General: NAD Neck: No JVD, no thyromegaly or thyroid nodule.  Lungs: Clear to auscultation bilaterally with normal respiratory effort. CV: Nondisplaced PMI.  Heart regular S1/S2, no S3/S4, no murmur.  No peripheral edema.  No carotid bruit.  Normal pedal pulses.  Abdomen: Soft, nontender, no hepatosplenomegaly, no distention.  Skin: Intact without lesions or rashes.  Neurologic: Alert and oriented x 3.  Psych: Normal affect. Extremities: No clubbing or cyanosis.  HEENT: Normal.   Assessment/Plan: 1. Breast cancer: Recent diagnosis of HER2+ breast cancer.  She will be starting a Herceptin-based regimen later this week.  We discussed the cardiac risk of Herceptin therapy and the rationale behind echo screening.  I reviewed her baseline echo today => difficult study, but LV systolic function is normal.  - Repeat echo with office followup in 3 months. 2. HTN: BP upper normal.  I will not change her regimen.   Loralie Champagne 05/10/2016

## 2016-05-10 NOTE — Telephone Encounter (Signed)
"  I just left there.  I'm at [place] to pick up the cream for my port-a-cath but the pharmacy does not have an order yet.  What do I need to do?"  This nurse will send order.  Confirmed she knows how to use  this cream.  ERx order sent.  Corrected preferred pharmacy information.

## 2016-05-11 ENCOUNTER — Ambulatory Visit: Payer: 59

## 2016-05-11 ENCOUNTER — Encounter: Payer: Self-pay | Admitting: *Deleted

## 2016-05-11 ENCOUNTER — Telehealth: Payer: Self-pay | Admitting: Hematology

## 2016-05-11 ENCOUNTER — Ambulatory Visit (HOSPITAL_BASED_OUTPATIENT_CLINIC_OR_DEPARTMENT_OTHER): Payer: 59

## 2016-05-11 ENCOUNTER — Ambulatory Visit (HOSPITAL_BASED_OUTPATIENT_CLINIC_OR_DEPARTMENT_OTHER): Payer: 59 | Admitting: Hematology

## 2016-05-11 ENCOUNTER — Other Ambulatory Visit: Payer: Self-pay | Admitting: *Deleted

## 2016-05-11 ENCOUNTER — Encounter: Payer: Self-pay | Admitting: Hematology

## 2016-05-11 VITALS — BP 144/58 | HR 72 | Temp 98.5°F | Resp 18 | Ht 64.0 in | Wt 213.8 lb

## 2016-05-11 VITALS — BP 141/83 | HR 68 | Temp 98.7°F | Resp 18

## 2016-05-11 DIAGNOSIS — Z5112 Encounter for antineoplastic immunotherapy: Secondary | ICD-10-CM | POA: Diagnosis not present

## 2016-05-11 DIAGNOSIS — C50412 Malignant neoplasm of upper-outer quadrant of left female breast: Secondary | ICD-10-CM | POA: Diagnosis not present

## 2016-05-11 DIAGNOSIS — K529 Noninfective gastroenteritis and colitis, unspecified: Secondary | ICD-10-CM

## 2016-05-11 DIAGNOSIS — E1140 Type 2 diabetes mellitus with diabetic neuropathy, unspecified: Secondary | ICD-10-CM | POA: Diagnosis not present

## 2016-05-11 DIAGNOSIS — Z5111 Encounter for antineoplastic chemotherapy: Secondary | ICD-10-CM | POA: Diagnosis not present

## 2016-05-11 DIAGNOSIS — M160 Bilateral primary osteoarthritis of hip: Secondary | ICD-10-CM | POA: Diagnosis not present

## 2016-05-11 DIAGNOSIS — I10 Essential (primary) hypertension: Secondary | ICD-10-CM

## 2016-05-11 DIAGNOSIS — Z171 Estrogen receptor negative status [ER-]: Secondary | ICD-10-CM

## 2016-05-11 DIAGNOSIS — Z95828 Presence of other vascular implants and grafts: Secondary | ICD-10-CM | POA: Insufficient documentation

## 2016-05-11 MED ORDER — ACETAMINOPHEN 325 MG PO TABS
ORAL_TABLET | ORAL | Status: AC
  Filled 2016-05-11: qty 2

## 2016-05-11 MED ORDER — SODIUM CHLORIDE 0.9 % IV SOLN
Freq: Once | INTRAVENOUS | Status: AC
  Administered 2016-05-11: 10:00:00 via INTRAVENOUS

## 2016-05-11 MED ORDER — LORAZEPAM 2 MG/ML IJ SOLN
INTRAMUSCULAR | Status: AC
  Filled 2016-05-11: qty 1

## 2016-05-11 MED ORDER — DEXAMETHASONE SODIUM PHOSPHATE 10 MG/ML IJ SOLN
10.0000 mg | Freq: Once | INTRAMUSCULAR | Status: AC
  Administered 2016-05-11: 10 mg via INTRAVENOUS

## 2016-05-11 MED ORDER — HEPARIN SOD (PORK) LOCK FLUSH 100 UNIT/ML IV SOLN
500.0000 [IU] | Freq: Once | INTRAVENOUS | Status: AC | PRN
  Administered 2016-05-11: 500 [IU]
  Filled 2016-05-11: qty 5

## 2016-05-11 MED ORDER — SODIUM CHLORIDE 0.9 % IV SOLN
840.0000 mg | Freq: Once | INTRAVENOUS | Status: AC
  Administered 2016-05-11: 840 mg via INTRAVENOUS
  Filled 2016-05-11: qty 28

## 2016-05-11 MED ORDER — DIPHENHYDRAMINE HCL 25 MG PO CAPS
ORAL_CAPSULE | ORAL | Status: AC
  Filled 2016-05-11: qty 2

## 2016-05-11 MED ORDER — SODIUM CHLORIDE 0.9 % IV SOLN
720.0000 mg | Freq: Once | INTRAVENOUS | Status: AC
  Administered 2016-05-11: 720 mg via INTRAVENOUS
  Filled 2016-05-11: qty 72

## 2016-05-11 MED ORDER — DEXAMETHASONE SODIUM PHOSPHATE 10 MG/ML IJ SOLN
INTRAMUSCULAR | Status: AC
  Filled 2016-05-11: qty 1

## 2016-05-11 MED ORDER — SODIUM CHLORIDE 0.9% FLUSH
10.0000 mL | INTRAVENOUS | Status: DC | PRN
  Administered 2016-05-11: 10 mL
  Filled 2016-05-11: qty 10

## 2016-05-11 MED ORDER — SODIUM CHLORIDE 0.9% FLUSH
10.0000 mL | Freq: Once | INTRAVENOUS | Status: AC
  Administered 2016-05-11: 10 mL
  Filled 2016-05-11: qty 10

## 2016-05-11 MED ORDER — LORAZEPAM 2 MG/ML IJ SOLN
0.5000 mg | Freq: Once | INTRAMUSCULAR | Status: AC
  Administered 2016-05-11: 0.5 mg via INTRAVENOUS

## 2016-05-11 MED ORDER — PALONOSETRON HCL INJECTION 0.25 MG/5ML
0.2500 mg | Freq: Once | INTRAVENOUS | Status: AC
  Administered 2016-05-11: 0.25 mg via INTRAVENOUS

## 2016-05-11 MED ORDER — ACETAMINOPHEN 325 MG PO TABS
650.0000 mg | ORAL_TABLET | Freq: Once | ORAL | Status: AC
  Administered 2016-05-11: 650 mg via ORAL

## 2016-05-11 MED ORDER — SODIUM CHLORIDE 0.9 % IV SOLN
75.0000 mg/m2 | Freq: Once | INTRAVENOUS | Status: AC
  Administered 2016-05-11: 160 mg via INTRAVENOUS
  Filled 2016-05-11: qty 16

## 2016-05-11 MED ORDER — TRASTUZUMAB CHEMO 150 MG IV SOLR
8.0000 mg/kg | Freq: Once | INTRAVENOUS | Status: AC
  Administered 2016-05-11: 777 mg via INTRAVENOUS
  Filled 2016-05-11: qty 37

## 2016-05-11 MED ORDER — PALONOSETRON HCL INJECTION 0.25 MG/5ML
INTRAVENOUS | Status: AC
  Filled 2016-05-11: qty 5

## 2016-05-11 MED ORDER — DIPHENHYDRAMINE HCL 25 MG PO CAPS
50.0000 mg | ORAL_CAPSULE | Freq: Once | ORAL | Status: AC
  Administered 2016-05-11: 50 mg via ORAL

## 2016-05-11 NOTE — Telephone Encounter (Signed)
Appointments scheduled per 4.13.18 LOS. Patient given AVS report and calendars with future scheduled appointments.

## 2016-05-11 NOTE — Progress Notes (Deleted)
Patient does not require phlebotomy today due to HCT. Patient verbalized understanding and instructed to call our office for any questions or concerns.   

## 2016-05-11 NOTE — Progress Notes (Signed)
Pt reports restless legs and "crawling feeling" to skin, states this is due to the benadryl.  Selena Lesser, NP notified and order received to administer 0.5 mg Ativan IVP.  Herceptin paused, NS started to flush IV line prior to administering Ativan.  Then Herceptin will be restarted.  Pt verbalized understanding

## 2016-05-11 NOTE — Patient Instructions (Addendum)
[facility] Discharge Instructions for Patients Receiving Chemotherapy  Today you received the following chemotherapy agents Herceptin/Perjeta/Taxotere/Cytoxan  To help prevent nausea and vomiting after your treatment, we encourage you to take your nausea medication as prescribed. If you develop nausea and vomiting that is not controlled by your nausea medication, call the clinic.   BELOW ARE SYMPTOMS THAT SHOULD BE REPORTED IMMEDIATELY:  *FEVER GREATER THAN 100.5 F  *CHILLS WITH OR WITHOUT FEVER  NAUSEA AND VOMITING THAT IS NOT CONTROLLED WITH YOUR NAUSEA MEDICATION  *UNUSUAL SHORTNESS OF BREATH  *UNUSUAL BRUISING OR BLEEDING  TENDERNESS IN MOUTH AND THROAT WITH OR WITHOUT PRESENCE OF ULCERS  *URINARY PROBLEMS  *BOWEL PROBLEMS  UNUSUAL RASH Items with * indicate a potential emergency and should be followed up as soon as possible.  Feel free to call the clinic you have any questions or concerns. The clinic phone number is [phone].  Please show the [facility] at check-in to the Emergency Department and triage nurse.   Trastuzumab injection for infusion (Herceptin) What is this medicine? TRASTUZUMAB (tras TOO zoo mab) is a monoclonal antibody. It is used to treat breast cancer and stomach cancer. This medicine may be used for other purposes; ask your health care provider or pharmacist if you have questions. COMMON BRAND NAME(S): Herceptin What should I tell my health care provider before I take this medicine? They need to know if you have any of these conditions: -heart disease -heart failure -lung or breathing disease, like asthma -an unusual or allergic reaction to trastuzumab, benzyl alcohol, or other medications, foods, dyes, or preservatives -pregnant or trying to get pregnant -breast-feeding How should I use this medicine? This drug is given as an infusion into a vein. It is administered in a hospital or clinic by a specially trained  health care professional. Talk to your pediatrician regarding the use of this medicine in children. This medicine is not approved for use in children. Overdosage: If you think you have taken too much of this medicine contact a poison control center or emergency room at once. NOTE: This medicine is only for you. Do not share this medicine with others. What if I miss a dose? It is important not to miss a dose. Call your doctor or health care professional if you are unable to keep an appointment. What may interact with this medicine? This medicine may interact with the following medications: -certain types of chemotherapy, such as daunorubicin, doxorubicin, epirubicin, and idarubicin This list may not describe all possible interactions. Give your health care provider a list of all the medicines, herbs, non-prescription drugs, or dietary supplements you use. Also tell them if you smoke, drink alcohol, or use illegal drugs. Some items may interact with your medicine. What should I watch for while using this medicine? Visit your doctor for checks on your progress. Report any side effects. Continue your course of treatment even though you feel ill unless your doctor tells you to stop. Call your doctor or health care professional for advice if you get a fever, chills or sore throat, or other symptoms of a cold or flu. Do not treat yourself. Try to avoid being around people who are sick. You may experience fever, chills and shaking during your first infusion. These effects are usually mild and can be treated with other medicines. Report any side effects during the infusion to your health care professional. Fever and chills usually do not happen with later infusions. Do not become pregnant while taking  this medicine or for 7 months after stopping it. Women should inform their doctor if they wish to become pregnant or think they might be pregnant. Women of child-bearing potential will need to have a negative  pregnancy test before starting this medicine. There is a potential for serious side effects to an unborn child. Talk to your health care professional or pharmacist for more information. Do not breast-feed an infant while taking this medicine or for 7 months after stopping it. Women must use effective birth control with this medicine. What side effects may I notice from receiving this medicine? Side effects that you should report to your doctor or health care professional as soon as possible: -allergic reactions like skin rash, itching or hives, swelling of the face, lips, or tongue -chest pain or palpitations -cough -dizziness -feeling faint or lightheaded, falls -fever -general ill feeling or flu-like symptoms -signs of worsening heart failure like breathing problems; swelling in your legs and feet -unusually weak or tired Side effects that usually do not require medical attention (report to your doctor or health care professional if they continue or are bothersome): -bone pain -changes in taste -diarrhea -joint pain -nausea/vomiting -weight loss This list may not describe all possible side effects. Call your doctor for medical advice about side effects. You may report side effects to FDA at 1-800-FDA-1088. Where should I keep my medicine? This drug is given in a hospital or clinic and will not be stored at home. NOTE: This sheet is a summary. It may not cover all possible information. If you have questions about this medicine, talk to your doctor, pharmacist, or health care provider.  2018 Elsevier/Gold Standard (2016-01-10 14:37:52)  Pertuzumab injection (Perjeta) What is this medicine? PERTUZUMAB (per TOOZ ue mab) is a monoclonal antibody. It is used to treat breast cancer. This medicine may be used for other purposes; ask your health care provider or pharmacist if you have questions. COMMON BRAND NAME(S): PERJETA What should I tell my health care provider before I take this  medicine? They need to know if you have any of these conditions: -heart disease -heart failure -high blood pressure -history of irregular heart beat -recent or ongoing radiation therapy -an unusual or allergic reaction to pertuzumab, other medicines, foods, dyes, or preservatives -pregnant or trying to get pregnant -breast-feeding How should I use this medicine? This medicine is for infusion into a vein. It is given by a health care professional in a hospital or clinic setting. Talk to your pediatrician regarding the use of this medicine in children. Special care may be needed. Overdosage: If you think you have taken too much of this medicine contact a poison control center or emergency room at once. NOTE: This medicine is only for you. Do not share this medicine with others. What if I miss a dose? It is important not to miss your dose. Call your doctor or health care professional if you are unable to keep an appointment. What may interact with this medicine? Interactions are not expected. Give your health care provider a list of all the medicines, herbs, non-prescription drugs, or dietary supplements you use. Also tell them if you smoke, drink alcohol, or use illegal drugs. Some items may interact with your medicine. This list may not describe all possible interactions. Give your health care provider a list of all the medicines, herbs, non-prescription drugs, or dietary supplements you use. Also tell them if you smoke, drink alcohol, or use illegal drugs. Some items may interact with your medicine.  What should I watch for while using this medicine? Your condition will be monitored carefully while you are receiving this medicine. Report any side effects. Continue your course of treatment even though you feel ill unless your doctor tells you to stop. Do not become pregnant while taking this medicine or for 7 months after stopping it. Women should inform their doctor if they wish to become  pregnant or think they might be pregnant. Women of child-bearing potential will need to have a negative pregnancy test before starting this medicine. There is a potential for serious side effects to an unborn child. Talk to your health care professional or pharmacist for more information. Do not breast-feed an infant while taking this medicine or for 7 months after stopping it. Women must use effective birth control with this medicine. Call your doctor or health care professional for advice if you get a fever, chills or sore throat, or other symptoms of a cold or flu. Do not treat yourself. Try to avoid being around people who are sick. You may experience fever, chills, and headache during the infusion. Report any side effects during the infusion to your health care professional. What side effects may I notice from receiving this medicine? Side effects that you should report to your doctor or health care professional as soon as possible: -breathing problems -chest pain or palpitations -dizziness -feeling faint or lightheaded -fever or chills -skin rash, itching or hives -sore throat -swelling of the face, lips, or tongue -swelling of the legs or ankles -unusually weak or tired Side effects that usually do not require medical attention (report to your doctor or health care professional if they continue or are bothersome): -diarrhea -hair loss -nausea, vomiting -tiredness This list may not describe all possible side effects. Call your doctor for medical advice about side effects. You may report side effects to FDA at 1-800-FDA-1088. Where should I keep my medicine? This drug is given in a hospital or clinic and will not be stored at home. NOTE: This sheet is a summary. It may not cover all possible information. If you have questions about this medicine, talk to your doctor, pharmacist, or health care provider.  2018 Elsevier/Gold Standard (2015-02-17 12:08:50)   Docetaxel injection  (Taxotere) What is this medicine? DOCETAXEL (doe se TAX el) is a chemotherapy drug. It targets fast dividing cells, like cancer cells, and causes these cells to die. This medicine is used to treat many types of cancers like breast cancer, certain stomach cancers, head and neck cancer, lung cancer, and prostate cancer. This medicine may be used for other purposes; ask your health care provider or pharmacist if you have questions. COMMON BRAND NAME(S): Docefrez, Taxotere What should I tell my health care provider before I take this medicine? They need to know if you have any of these conditions: -infection (especially a virus infection such as chickenpox, cold sores, or herpes) -liver disease -low blood counts, like low white cell, platelet, or red cell counts -an unusual or allergic reaction to docetaxel, polysorbate 80, other chemotherapy agents, other medicines, foods, dyes, or preservatives -pregnant or trying to get pregnant -breast-feeding How should I use this medicine? This drug is given as an infusion into a vein. It is administered in a hospital or clinic by a specially trained health care professional. Talk to your pediatrician regarding the use of this medicine in children. Special care may be needed. Overdosage: If you think you have taken too much of this medicine contact a poison control center  or emergency room at once. NOTE: This medicine is only for you. Do not share this medicine with others. What if I miss a dose? It is important not to miss your dose. Call your doctor or health care professional if you are unable to keep an appointment. What may interact with this medicine? -cyclosporine -erythromycin -ketoconazole -medicines to increase blood counts like filgrastim, pegfilgrastim, sargramostim -vaccines Talk to your doctor or health care professional before taking any of these medicines: -acetaminophen -aspirin -ibuprofen -ketoprofen -naproxen This list may not  describe all possible interactions. Give your health care provider a list of all the medicines, herbs, non-prescription drugs, or dietary supplements you use. Also tell them if you smoke, drink alcohol, or use illegal drugs. Some items may interact with your medicine. What should I watch for while using this medicine? Your condition will be monitored carefully while you are receiving this medicine. You will need important blood work done while you are taking this medicine. This drug may make you feel generally unwell. This is not uncommon, as chemotherapy can affect healthy cells as well as cancer cells. Report any side effects. Continue your course of treatment even though you feel ill unless your doctor tells you to stop. In some cases, you may be given additional medicines to help with side effects. Follow all directions for their use. Call your doctor or health care professional for advice if you get a fever, chills or sore throat, or other symptoms of a cold or flu. Do not treat yourself. This drug decreases your body's ability to fight infections. Try to avoid being around people who are sick. This medicine may increase your risk to bruise or bleed. Call your doctor or health care professional if you notice any unusual bleeding. This medicine may contain alcohol in the product. You may get drowsy or dizzy. Do not drive, use machinery, or do anything that needs mental alertness until you know how this medicine affects you. Do not stand or sit up quickly, especially if you are an older patient. This reduces the risk of dizzy or fainting spells. Avoid alcoholic drinks. Do not become pregnant while taking this medicine. Women should inform their doctor if they wish to become pregnant or think they might be pregnant. There is a potential for serious side effects to an unborn child. Talk to your health care professional or pharmacist for more information. Do not breast-feed an infant while taking this  medicine. What side effects may I notice from receiving this medicine? Side effects that you should report to your doctor or health care professional as soon as possible: -allergic reactions like skin rash, itching or hives, swelling of the face, lips, or tongue -low blood counts - This drug may decrease the number of white blood cells, red blood cells and platelets. You may be at increased risk for infections and bleeding. -signs of infection - fever or chills, cough, sore throat, pain or difficulty passing urine -signs of decreased platelets or bleeding - bruising, pinpoint red spots on the skin, black, tarry stools, nosebleeds -signs of decreased red blood cells - unusually weak or tired, fainting spells, lightheadedness -breathing problems -fast or irregular heartbeat -low blood pressure -mouth sores -nausea and vomiting -pain, swelling, redness or irritation at the injection site -pain, tingling, numbness in the hands or feet -swelling of the ankle, feet, hands -weight gain Side effects that usually do not require medical attention (report to your doctor or health care professional if they continue or are bothersome): -  bone pain -complete hair loss including hair on your head, underarms, pubic hair, eyebrows, and eyelashes -diarrhea -excessive tearing -changes in the color of fingernails -loosening of the fingernails -nausea -muscle pain -red flush to skin -sweating -weak or tired This list may not describe all possible side effects. Call your doctor for medical advice about side effects. You may report side effects to FDA at 1-800-FDA-1088. Where should I keep my medicine? This drug is given in a hospital or clinic and will not be stored at home. NOTE: This sheet is a summary. It may not cover all possible information. If you have questions about this medicine, talk to your doctor, pharmacist, or health care provider.  2018 Elsevier/Gold Standard (2015-02-17  12:32:56)  Carboplatin injection What is this medicine? CARBOPLATIN (KAR boe pla tin) is a chemotherapy drug. It targets fast dividing cells, like cancer cells, and causes these cells to die. This medicine is used to treat ovarian cancer and many other cancers. This medicine may be used for other purposes; ask your health care provider or pharmacist if you have questions. COMMON BRAND NAME(S): Paraplatin What should I tell my health care provider before I take this medicine? They need to know if you have any of these conditions: -blood disorders -hearing problems -kidney disease -recent or ongoing radiation therapy -an unusual or allergic reaction to carboplatin, cisplatin, other chemotherapy, other medicines, foods, dyes, or preservatives -pregnant or trying to get pregnant -breast-feeding How should I use this medicine? This drug is usually given as an infusion into a vein. It is administered in a hospital or clinic by a specially trained health care professional. Talk to your pediatrician regarding the use of this medicine in children. Special care may be needed. Overdosage: If you think you have taken too much of this medicine contact a poison control center or emergency room at once. NOTE: This medicine is only for you. Do not share this medicine with others. What if I miss a dose? It is important not to miss a dose. Call your doctor or health care professional if you are unable to keep an appointment. What may interact with this medicine? -medicines for seizures -medicines to increase blood counts like filgrastim, pegfilgrastim, sargramostim -some antibiotics like amikacin, gentamicin, neomycin, streptomycin, tobramycin -vaccines Talk to your doctor or health care professional before taking any of these medicines: -acetaminophen -aspirin -ibuprofen -ketoprofen -naproxen This list may not describe all possible interactions. Give your health care provider a list of all the  medicines, herbs, non-prescription drugs, or dietary supplements you use. Also tell them if you smoke, drink alcohol, or use illegal drugs. Some items may interact with your medicine. What should I watch for while using this medicine? Your condition will be monitored carefully while you are receiving this medicine. You will need important blood work done while you are taking this medicine. This drug may make you feel generally unwell. This is not uncommon, as chemotherapy can affect healthy cells as well as cancer cells. Report any side effects. Continue your course of treatment even though you feel ill unless your doctor tells you to stop. In some cases, you may be given additional medicines to help with side effects. Follow all directions for their use. Call your doctor or health care professional for advice if you get a fever, chills or sore throat, or other symptoms of a cold or flu. Do not treat yourself. This drug decreases your body's ability to fight infections. Try to avoid being around people who are  sick. This medicine may increase your risk to bruise or bleed. Call your doctor or health care professional if you notice any unusual bleeding. Be careful brushing and flossing your teeth or using a toothpick because you may get an infection or bleed more easily. If you have any dental work done, tell your dentist you are receiving this medicine. Avoid taking products that contain aspirin, acetaminophen, ibuprofen, naproxen, or ketoprofen unless instructed by your doctor. These medicines may hide a fever. Do not become pregnant while taking this medicine. Women should inform their doctor if they wish to become pregnant or think they might be pregnant. There is a potential for serious side effects to an unborn child. Talk to your health care professional or pharmacist for more information. Do not breast-feed an infant while taking this medicine. What side effects may I notice from receiving this  medicine? Side effects that you should report to your doctor or health care professional as soon as possible: -allergic reactions like skin rash, itching or hives, swelling of the face, lips, or tongue -signs of infection - fever or chills, cough, sore throat, pain or difficulty passing urine -signs of decreased platelets or bleeding - bruising, pinpoint red spots on the skin, black, tarry stools, nosebleeds -signs of decreased red blood cells - unusually weak or tired, fainting spells, lightheadedness -breathing problems -changes in hearing -changes in vision -chest pain -high blood pressure -low blood counts - This drug may decrease the number of white blood cells, red blood cells and platelets. You may be at increased risk for infections and bleeding. -nausea and vomiting -pain, swelling, redness or irritation at the injection site -pain, tingling, numbness in the hands or feet -problems with balance, talking, walking -trouble passing urine or change in the amount of urine Side effects that usually do not require medical attention (report to your doctor or health care professional if they continue or are bothersome): -hair loss -loss of appetite -metallic taste in the mouth or changes in taste This list may not describe all possible side effects. Call your doctor for medical advice about side effects. You may report side effects to FDA at 1-800-FDA-1088. Where should I keep my medicine? This drug is given in a hospital or clinic and will not be stored at home. NOTE: This sheet is a summary. It may not cover all possible information. If you have questions about this medicine, talk to your doctor, pharmacist, or health care provider.  2018 Elsevier/Gold Standard (2007-04-22 14:38:05)

## 2016-05-13 ENCOUNTER — Encounter (HOSPITAL_COMMUNITY): Payer: Self-pay | Admitting: Emergency Medicine

## 2016-05-13 ENCOUNTER — Emergency Department (HOSPITAL_COMMUNITY)
Admission: EM | Admit: 2016-05-13 | Discharge: 2016-05-13 | Disposition: A | Discharge status: Home / Self Care | Payer: 59 | Attending: Emergency Medicine | Admitting: Emergency Medicine

## 2016-05-13 ENCOUNTER — Other Ambulatory Visit: Payer: Self-pay

## 2016-05-13 ENCOUNTER — Emergency Department (HOSPITAL_COMMUNITY): Payer: 59

## 2016-05-13 DIAGNOSIS — J45909 Unspecified asthma, uncomplicated: Secondary | ICD-10-CM | POA: Diagnosis not present

## 2016-05-13 DIAGNOSIS — Z7982 Long term (current) use of aspirin: Secondary | ICD-10-CM | POA: Insufficient documentation

## 2016-05-13 DIAGNOSIS — Z87891 Personal history of nicotine dependence: Secondary | ICD-10-CM | POA: Insufficient documentation

## 2016-05-13 DIAGNOSIS — Z96643 Presence of artificial hip joint, bilateral: Secondary | ICD-10-CM | POA: Insufficient documentation

## 2016-05-13 DIAGNOSIS — I10 Essential (primary) hypertension: Secondary | ICD-10-CM | POA: Insufficient documentation

## 2016-05-13 DIAGNOSIS — E1140 Type 2 diabetes mellitus with diabetic neuropathy, unspecified: Secondary | ICD-10-CM | POA: Insufficient documentation

## 2016-05-13 DIAGNOSIS — R0600 Dyspnea, unspecified: Secondary | ICD-10-CM | POA: Diagnosis not present

## 2016-05-13 DIAGNOSIS — R0602 Shortness of breath: Secondary | ICD-10-CM | POA: Diagnosis not present

## 2016-05-13 LAB — COMPREHENSIVE METABOLIC PANEL
ALK PHOS: 70 U/L (ref 38–126)
ALT: 26 U/L (ref 14–54)
AST: 26 U/L (ref 15–41)
Albumin: 3.8 g/dL (ref 3.5–5.0)
Anion gap: 9 (ref 5–15)
BILIRUBIN TOTAL: 0.8 mg/dL (ref 0.3–1.2)
BUN: 17 mg/dL (ref 6–20)
CALCIUM: 9.1 mg/dL (ref 8.9–10.3)
CO2: 24 mmol/L (ref 22–32)
CREATININE: 0.78 mg/dL (ref 0.44–1.00)
Chloride: 103 mmol/L (ref 101–111)
GFR calc non Af Amer: >60 mL/min (ref >60)
GLUCOSE: 123 mg/dL — AB (ref 65–99)
Potassium: 4.2 mmol/L (ref 3.5–5.1)
SODIUM: 136 mmol/L (ref 135–145)
TOTAL PROTEIN: 7.9 g/dL (ref 6.5–8.1)

## 2016-05-13 LAB — CBC
HEMATOCRIT: 40.8 % (ref 36.0–46.0)
HEMOGLOBIN: 13.6 g/dL (ref 12.0–15.0)
MCH: 28.3 pg (ref 26.0–34.0)
MCHC: 33.3 g/dL (ref 30.0–36.0)
MCV: 84.8 fL (ref 78.0–100.0)
Platelets: 260 10*3/uL (ref 150–400)
RBC: 4.81 MIL/uL (ref 3.87–5.11)
RDW: 13.7 % (ref 11.5–15.5)
WBC: 6.3 10*3/uL (ref 4.0–10.5)

## 2016-05-13 LAB — D-DIMER, QUANTITATIVE: D-Dimer, Quant: 0.64 ug/mL-FEU — ABNORMAL HIGH (ref 0.00–0.50)

## 2016-05-13 LAB — TROPONIN I

## 2016-05-13 LAB — BRAIN NATRIURETIC PEPTIDE: B Natriuretic Peptide: 21.1 pg/mL (ref 0.0–100.0)

## 2016-05-13 MED ORDER — SODIUM CHLORIDE 0.9 % IV BOLUS (SEPSIS)
1000.0000 mL | Freq: Once | INTRAVENOUS | Status: AC
  Administered 2016-05-13: 1000 mL via INTRAVENOUS

## 2016-05-13 MED ORDER — IOPAMIDOL (ISOVUE-370) INJECTION 76%
INTRAVENOUS | Status: AC
  Administered 2016-05-13: 100 mL
  Filled 2016-05-13: qty 100

## 2016-05-13 MED ORDER — HEPARIN SOD (PORK) LOCK FLUSH 100 UNIT/ML IV SOLN
500.0000 [IU] | Freq: Once | INTRAVENOUS | Status: AC
  Administered 2016-05-13: 500 [IU]
  Filled 2016-05-13: qty 5

## 2016-05-13 MED ORDER — IPRATROPIUM-ALBUTEROL 0.5-2.5 (3) MG/3ML IN SOLN
3.0000 mL | RESPIRATORY_TRACT | Status: DC
  Administered 2016-05-13: 3 mL via RESPIRATORY_TRACT
  Filled 2016-05-13: qty 3

## 2016-05-13 MED ORDER — DEXAMETHASONE SODIUM PHOSPHATE 10 MG/ML IJ SOLN
10.0000 mg | Freq: Once | INTRAMUSCULAR | Status: AC
  Administered 2016-05-13: 10 mg via INTRAVENOUS
  Filled 2016-05-13: qty 1

## 2016-05-13 NOTE — ED Notes (Signed)
Patient transported to X-ray 

## 2016-05-13 NOTE — ED Notes (Signed)
ED Provider at bedside. 

## 2016-05-13 NOTE — ED Provider Notes (Signed)
[facility] DEPT Provider Note   CSN: [id number] Arrival date & time: 05/13/16  1325     History   Chief Complaint Chief Complaint  Patient presents with  . Shortness of Breath    HPI Brandi Dickson is a 56 y.o. female.   Shortness of Breath   A 56 year old female with history of asthma, breast cancer status post 1 chemotherapy treatment on Friday, diabetes, hypertension who presents to the emergency department today which was of breath that started this morning. She describes it as a chest pressure feels like something is wrapped around her chest to make it hard for her to take a deep breath. She's had asthma exacerbations before and does not feel like this is similar to those. She has not had a fever or cough. She has no lower extremity swelling or history of heart failure. She didn't have any episodes or concerns after her chemotherapy on Friday however yesterday she did go to a potluck and after that she had diarrhea and so did her husband. No vomiting. No other associated or exacerbating symptoms.  Past Medical History:  Diagnosis Date  . Arthritis   . Asthma    triggered with [name] perfumes and cigarette smoke  . Cancer ([place])   . Colitis   . Diabetes mellitus without complication ([place])   . Hypertension   . Neuropathy     Patient Active Problem List   Diagnosis Date Noted  . Port catheter in place 05/11/2016  . Breast cancer of upper-outer quadrant of left female breast ([place]) 05/02/2016  . S/P left THA, AA 03/06/2016  . Rectal bleeding 07/14/2015  . LLQ abdominal pain 07/14/2015  . Special screening for malignant neoplasms, colon 07/14/2015  . Loss of weight 07/14/2015  . Family history of colon cancer 07/14/2015    Past Surgical History:  Procedure Laterality Date  . ABDOMINAL HYSTERECTOMY    . DILATION AND CURETTAGE OF UTERUS    . PORTACATH PLACEMENT Right 05/08/2016   Procedure: INSERTION PORT-A-CATH WITH [place];  Surgeon: [name], MD;   Location: [facility];  Service: General;  Laterality: Right;  . TONSILLECTOMY    . TOTAL HIP ARTHROPLASTY Right   . TOTAL HIP ARTHROPLASTY Left 03/06/2016   Procedure: LEFT TOTAL HIP ARTHROPLASTY ANTERIOR APPROACH;  Surgeon: [name], MD;  Location: [facility];  Service: Orthopedics;  Laterality: Left;    OB History    No data available       Home Medications    Prior to Admission medications   Medication Sig Start Date End Date Taking? Authorizing Provider  albuterol (PROVENTIL HFA;VENTOLIN HFA) 108 (90 Base) MCG/ACT inhaler Inhale 1-2 puffs into the lungs every 6 (six) hours as needed for wheezing or shortness of breath.   Yes Historical Provider, MD  ASPIRIN 81 PO Take by mouth.   Yes Historical Provider, MD  dexamethasone (DECADRON) 4 MG tablet Take 1 tablet (4 mg total) by mouth daily. Start the day before Taxotere. Then again the day after chemo for 3 days. 05/02/16  Yes Truitt Merle, MD  ferrous sulfate (FERROUSUL) 325 (65 FE) MG tablet Take 1 tablet (325 mg total) by mouth 3 (three) times daily with meals. 03/06/16  Yes Danae Orleans, PA-C  gabapentin (NEURONTIN) 100 MG capsule Take 200 mg by mouth at bedtime.    Yes Historical Provider, MD  lidocaine-prilocaine (EMLA) cream Apply 1 application to skin 1.5 to 2 hrs before use.  Cover to secure cream with plastic wrap. 05/10/16  Yes Truitt Merle, MD  loperamide (IMODIUM) 1 MG/5ML solution Take 3 mg by mouth as needed for diarrhea or loose stools.   Yes Historical Provider, MD  LORazepam (ATIVAN) 0.5 MG tablet Take 1 tablet (0.5 mg total) by mouth once as needed for anxiety. 05/02/16  Yes Truitt Merle, MD  Mesalamine (ASACOL HD) 800 MG TBEC Take 1 tablet (800 mg total) by mouth 3 (three) times daily. 04/30/16  Yes Ladene Artist, MD  metFORMIN (GLUCOPHAGE-XR) 500 MG 24 hr tablet Take 1,000 mg by mouth at bedtime.   Yes Historical Provider, MD  metoprolol (LOPRESSOR) 50 MG tablet Take 50 mg by mouth 2 (two) times daily.   Yes Historical  Provider, MD  niacin 250 MG tablet Take 250 mg by mouth daily before breakfast.   Yes Historical Provider, MD  ondansetron (ZOFRAN) 8 MG tablet Take 1 tablet (8 mg total) by mouth 2 (two) times daily as needed for refractory nausea / vomiting. Start on day 3 after chemo. 05/02/16  Yes Truitt Merle, MD  prochlorperazine (COMPAZINE) 10 MG tablet Take 1 tablet (10 mg total) by mouth every 6 (six) hours as needed (Nausea or vomiting). 05/02/16  Yes Truitt Merle, MD  Specialty Vitamins Products (VITAMINS FOR THE HAIR) TABS Take 2 tablets by mouth 2 (two) times daily.   Yes Historical Provider, MD  traMADol (ULTRAM) 50 MG tablet Take 1-2 tablets (50-100 mg total) by mouth every 6 (six) hours as needed. 03/06/16  Yes Danae Orleans, PA-C  triamterene-hydrochlorothiazide (MAXZIDE) 75-50 MG per tablet Take 0.5 tablets by mouth daily.    Yes Historical Provider, MD    Family History Family History  Problem Relation Age of Onset  . Colon cancer Father   . Stomach cancer Paternal Uncle   . Stomach cancer Paternal Uncle   . Melanoma Brother   . Thyroid cancer Brother   . Breast cancer Maternal Aunt   . Breast cancer Maternal Aunt   . Breast cancer Cousin     Social History Social History  Substance Use Topics  . Smoking status: Former Smoker    Packs/day: 1.00    Years: 29.00    Types: Cigarettes    Quit date: 10/27/2002  . Smokeless tobacco: Never Used  . Alcohol use No     Allergies   Lisinopril; Augmentin [amoxicillin-pot clavulanate]; and Benadryl [diphenhydramine]   Review of Systems Review of Systems  Respiratory: Positive for shortness of breath.   All other systems reviewed and are negative.    Physical Exam Updated Vital Signs BP 121/70 (BP Location: Right Arm)   Pulse 78   Temp 98.9 F (37.2 C) (Oral)   Resp 14   Ht 5' 4"  (1.626 m)   Wt 213 lb (96.6 kg)   SpO2 96%   BMI 36.56 kg/m   Physical Exam  Constitutional: She is oriented to person, place, and time. She appears  well-developed and well-nourished.  HENT:  Head: Normocephalic and atraumatic.  Eyes: Conjunctivae and EOM are normal.  Neck: Normal range of motion.  Cardiovascular: Normal rate and regular rhythm.   Pulmonary/Chest: Effort normal and breath sounds normal. No stridor. No respiratory distress. She has no wheezes.  Abdominal: Soft. She exhibits no distension.  Musculoskeletal: Normal range of motion. She exhibits no edema or deformity.  Neurological: She is alert and oriented to person, place, and time. No cranial nerve deficit. Coordination normal.  Skin: Skin is warm and dry.  Nursing note and vitals reviewed.    ED  Treatments / Results  Labs (all labs ordered are listed, but only abnormal results are displayed) Labs Reviewed  COMPREHENSIVE METABOLIC PANEL - Abnormal; Notable for the following:       Result Value   Glucose, Bld 123 (*)    All other components within normal limits  D-DIMER, QUANTITATIVE (NOT AT [facility]) - Abnormal; Notable for the following:    D-Dimer, Quant 0.64 (*)    All other components within normal limits  CBC  BRAIN NATRIURETIC PEPTIDE  TROPONIN I    EKG  EKG Interpretation None       Radiology Dg Chest 2 View  Result Date: 05/13/2016 CLINICAL DATA:  Shortness of breath this morning EXAM: CHEST  2 VIEW COMPARISON:  05/09/2014 FINDINGS: Cardiomediastinal silhouette is stable. Right IJ Port-A-Cath is unchanged in position. No infiltrate or pleural effusion. No pulmonary edema. Bony thorax is stable. IMPRESSION: No active cardiopulmonary disease. Electronically Signed   By: Lahoma Crocker M.D.   On: 05/13/2016 14:47   Ct Angio Chest Pe W And/or Wo Contrast  Result Date: 05/13/2016 CLINICAL DATA:  Shortness of Breath EXAM: CT ANGIOGRAPHY CHEST WITH CONTRAST TECHNIQUE: Multidetector CT imaging of the chest was performed using the standard protocol during bolus administration of intravenous contrast. Multiplanar CT image reconstructions and MIPs were obtained  to evaluate the vascular anatomy. CONTRAST:  100 mL Isovue 370. COMPARISON:  Chest x-ray from earlier in the same day FINDINGS: Cardiovascular: Thoracic aorta shows a normal branching pattern with mild atherosclerotic changes in the descending thoracic aorta. No significant calcifications, aneurysmal dilatation or dissection is seen. The pulmonary artery shows a normal branching pattern without intraluminal filling defect to suggest pulmonary embolism. No cardiac enlargement is seen. No significant coronary calcifications are noted. A right chest wall port is seen in satisfactory position. Mediastinum/Nodes: The thoracic inlet is within normal limits. No sizable hilar or mediastinal adenopathy is noted. The esophagus as visualize is within normal limits. No axillary adenopathy is seen. Lungs/Pleura: Well aerated bilaterally without focal infiltrate or sizable effusion. No pneumothorax or parenchymal nodules are seen. Upper Abdomen: A hypodense 1.7 cm lesion is noted within the left adrenal gland likely representing a small adenoma. This is stable from a prior CT examination dated 07/15/2015. Musculoskeletal: No acute abnormality noted. Changes in the left breast are noted consistent with the given clinical history. Review of the MIP images confirms the above findings. IMPRESSION: No evidence of pulmonary emboli. Left breast mass consistent with the given clinical history. Stable left adrenal lesion likely representing a small adenoma. Electronically Signed   By: Inez Catalina M.D.   On: 05/13/2016 17:57    Procedures Procedures (including critical care time)  Medications Ordered in ED Medications  sodium chloride 0.9 % bolus 1,000 mL (0 mLs Intravenous Stopped 05/13/16 1813)  iopamidol (ISOVUE-370) 76 % injection (100 mLs  Contrast Given 05/13/16 1659)  dexamethasone (DECADRON) injection 10 mg (10 mg Intravenous Given 05/13/16 1831)  heparin lock flush 100 unit/mL (500 Units Intracatheter Given 05/13/16 1831)       Initial Impression / Assessment and Plan / ED Course  I have reviewed the triage vital signs and the nursing notes.  Pertinent labs & imaging results that were available during my care of the patient were reviewed by me and considered in my medical decision making (see chart for details).     Will eval for PE, treat for asthma.    Workup overall negative. Doubt ACS. No e/o PE. No e/o PNA. Discussed adrenal abnormality,  will note and fu w/ PCP. Decadron given, will continue albuterol at home. Stable for dc.   Final Clinical Impressions(s) / ED Diagnoses   Final diagnoses:  Dyspnea, unspecified type    New Prescriptions Discharge Medication List as of 05/13/2016  6:02 PM       Merrily Pew, MD 05/13/16 2255

## 2016-05-13 NOTE — ED Triage Notes (Signed)
Patient reports that she has breast cancer and had chemo on Friday. Patient states that she felt bad during the night after going to a birthday party and then today started having SOB.  Patient states that it differ than her asthma, states that she just cant get a full deep breath.

## 2016-05-14 ENCOUNTER — Ambulatory Visit (HOSPITAL_BASED_OUTPATIENT_CLINIC_OR_DEPARTMENT_OTHER): Payer: 59

## 2016-05-14 VITALS — BP 147/89 | HR 75 | Temp 98.6°F | Resp 18

## 2016-05-14 DIAGNOSIS — Z5189 Encounter for other specified aftercare: Secondary | ICD-10-CM

## 2016-05-14 DIAGNOSIS — C50412 Malignant neoplasm of upper-outer quadrant of left female breast: Secondary | ICD-10-CM

## 2016-05-14 DIAGNOSIS — Z171 Estrogen receptor negative status [ER-]: Principal | ICD-10-CM

## 2016-05-14 MED ORDER — PEGFILGRASTIM INJECTION 6 MG/0.6ML ~~LOC~~
6.0000 mg | PREFILLED_SYRINGE | Freq: Once | SUBCUTANEOUS | Status: AC
  Administered 2016-05-14: 6 mg via SUBCUTANEOUS
  Filled 2016-05-14: qty 0.6

## 2016-05-14 NOTE — Patient Instructions (Signed)
Pegfilgrastim injection What is this medicine? PEGFILGRASTIM (PEG fil gra stim) is a long-acting granulocyte colony-stimulating factor that stimulates the growth of neutrophils, a type of white blood cell important in the body's fight against infection. It is used to reduce the incidence of fever and infection in patients with certain types of cancer who are receiving chemotherapy that affects the bone marrow, and to increase survival after being exposed to high doses of radiation. This medicine may be used for other purposes; ask your health care provider or pharmacist if you have questions. COMMON BRAND NAME(S): Neulasta What should I tell my health care provider before I take this medicine? They need to know if you have any of these conditions: -kidney disease -latex allergy -ongoing radiation therapy -sickle cell disease -skin reactions to acrylic adhesives (On-Body Injector only) -an unusual or allergic reaction to pegfilgrastim, filgrastim, other medicines, foods, dyes, or preservatives -pregnant or trying to get pregnant -breast-feeding How should I use this medicine? This medicine is for injection under the skin. If you get this medicine at home, you will be taught how to prepare and give the pre-filled syringe or how to use the On-body Injector. Refer to the patient Instructions for Use for detailed instructions. Use exactly as directed. Tell your healthcare provider immediately if you suspect that the On-body Injector may not have performed as intended or if you suspect the use of the On-body Injector resulted in a missed or partial dose. It is important that you put your used needles and syringes in a special sharps container. Do not put them in a trash can. If you do not have a sharps container, call your pharmacist or healthcare provider to get one. Talk to your pediatrician regarding the use of this medicine in children. While this drug may be prescribed for selected conditions,  precautions do apply. Overdosage: If you think you have taken too much of this medicine contact a poison control center or emergency room at once. NOTE: This medicine is only for you. Do not share this medicine with others. What if I miss a dose? It is important not to miss your dose. Call your doctor or health care professional if you miss your dose. If you miss a dose due to an On-body Injector failure or leakage, a new dose should be administered as soon as possible using a single prefilled syringe for manual use. What may interact with this medicine? Interactions have not been studied. Give your health care provider a list of all the medicines, herbs, non-prescription drugs, or dietary supplements you use. Also tell them if you smoke, drink alcohol, or use illegal drugs. Some items may interact with your medicine. This list may not describe all possible interactions. Give your health care provider a list of all the medicines, herbs, non-prescription drugs, or dietary supplements you use. Also tell them if you smoke, drink alcohol, or use illegal drugs. Some items may interact with your medicine. What should I watch for while using this medicine? You may need blood work done while you are taking this medicine. If you are going to need a MRI, CT scan, or other procedure, tell your doctor that you are using this medicine (On-Body Injector only). What side effects may I notice from receiving this medicine? Side effects that you should report to your doctor or health care professional as soon as possible: -allergic reactions like skin rash, itching or hives, swelling of the face, lips, or tongue -dizziness -fever -pain, redness, or irritation at site  where injected -pinpoint red spots on the skin -red or dark-brown urine -shortness of breath or breathing problems -stomach or side pain, or pain at the shoulder -swelling -tiredness -trouble passing urine or change in the amount of urine Side  effects that usually do not require medical attention (report to your doctor or health care professional if they continue or are bothersome): -bone pain -muscle pain This list may not describe all possible side effects. Call your doctor for medical advice about side effects. You may report side effects to FDA at 1-800-FDA-1088. Where should I keep my medicine? Keep out of the reach of children. Store pre-filled syringes in a refrigerator between 2 and 8 degrees C (36 and 46 degrees F). Do not freeze. Keep in carton to protect from light. Throw away this medicine if it is left out of the refrigerator for more than 48 hours. Throw away any unused medicine after the expiration date. NOTE: This sheet is a summary. It may not cover all possible information. If you have questions about this medicine, talk to your doctor, pharmacist, or health care provider.  2018 Elsevier/Gold Standard (2016-01-12 12:58:03)

## 2016-05-16 ENCOUNTER — Telehealth: Payer: Self-pay | Admitting: *Deleted

## 2016-05-16 DIAGNOSIS — Z170 Estrogen receptor positive status [ER+]: Principal | ICD-10-CM

## 2016-05-16 DIAGNOSIS — C50412 Malignant neoplasm of upper-outer quadrant of left female breast: Secondary | ICD-10-CM

## 2016-05-16 MED ORDER — DIPHENOXYLATE-ATROPINE 2.5-0.025 MG PO TABS: ORAL_TABLET | ORAL | 1 refills | Status: DC

## 2016-05-16 NOTE — Telephone Encounter (Signed)
[place] operator received call from patient reporting six stools rectal bleeding so far this morning.  Returned call to patient.  "My first chemotherapy was 05-11-2016.  Diarrhea started yesterday.  I stayed out of work yesterday.  I've taken imodium liquid yesterday and today but this morning, nothing but blood six time with blood in the bowl.  I have colitis.  The chemotherapy diarrhea may have aggravated this.  What do I do?  I'm very nauseated and not eating as much (crackers, chicken and rice soup).  Threw up once yesterday.  Before when this happens, Dr. Fuller Plan tells me to use a suppository I have on hand. "    Verbal order received and read back from [name] for Lomotil 1-2 tabs q 6 hrs prn.  Take up to eight imodium per 24 hours as needed and to call if signs of dehydration or diarrhea unresolved for follow up.    Patient given these orders and instructions along with symptoms/signs of dehydration, diet precautions.  Encouraged to force fluids (water, sports drinks) always but especially for the next twelve hours., Reintroduce soft foods slowly or follow BRAT diet.  Lomotil order called to [facility] on [address].  No suppository on medication list as mentioned by patient but Mesalamine 800 mg tablets are on list.

## 2016-05-17 ENCOUNTER — Other Ambulatory Visit: Payer: 59

## 2016-05-17 ENCOUNTER — Encounter: Payer: 59 | Admitting: Genetics

## 2016-05-18 ENCOUNTER — Encounter (HOSPITAL_COMMUNITY): Payer: Self-pay | Admitting: Emergency Medicine

## 2016-05-18 ENCOUNTER — Ambulatory Visit: Payer: 59

## 2016-05-18 ENCOUNTER — Other Ambulatory Visit: Payer: Self-pay

## 2016-05-18 ENCOUNTER — Ambulatory Visit: Payer: 59 | Admitting: Hematology

## 2016-05-18 ENCOUNTER — Encounter (HOSPITAL_COMMUNITY): Payer: Self-pay | Admitting: Internal Medicine

## 2016-05-18 ENCOUNTER — Other Ambulatory Visit: Payer: 59

## 2016-05-18 ENCOUNTER — Encounter: Payer: Self-pay | Admitting: Adult Health

## 2016-05-18 ENCOUNTER — Ambulatory Visit (HOSPITAL_BASED_OUTPATIENT_CLINIC_OR_DEPARTMENT_OTHER): Payer: 59 | Admitting: Adult Health

## 2016-05-18 ENCOUNTER — Telehealth: Payer: Self-pay | Admitting: *Deleted

## 2016-05-18 ENCOUNTER — Inpatient Hospital Stay (HOSPITAL_COMMUNITY)
Admission: EM | Admit: 2016-05-18 | Discharge: 2016-05-24 | DRG: 385 | Disposition: A | Discharge status: Home / Self Care | Payer: 59 | Attending: Internal Medicine | Admitting: Internal Medicine

## 2016-05-18 ENCOUNTER — Ambulatory Visit (HOSPITAL_BASED_OUTPATIENT_CLINIC_OR_DEPARTMENT_OTHER): Payer: 59

## 2016-05-18 ENCOUNTER — Other Ambulatory Visit (HOSPITAL_BASED_OUTPATIENT_CLINIC_OR_DEPARTMENT_OTHER): Payer: 59

## 2016-05-18 DIAGNOSIS — I10 Essential (primary) hypertension: Secondary | ICD-10-CM | POA: Diagnosis present

## 2016-05-18 DIAGNOSIS — Z90710 Acquired absence of both cervix and uterus: Secondary | ICD-10-CM

## 2016-05-18 DIAGNOSIS — Z7952 Long term (current) use of systemic steroids: Secondary | ICD-10-CM | POA: Diagnosis not present

## 2016-05-18 DIAGNOSIS — E1140 Type 2 diabetes mellitus with diabetic neuropathy, unspecified: Secondary | ICD-10-CM

## 2016-05-18 DIAGNOSIS — D62 Acute posthemorrhagic anemia: Secondary | ICD-10-CM | POA: Diagnosis present

## 2016-05-18 DIAGNOSIS — C50919 Malignant neoplasm of unspecified site of unspecified female breast: Secondary | ICD-10-CM | POA: Diagnosis not present

## 2016-05-18 DIAGNOSIS — D6959 Other secondary thrombocytopenia: Secondary | ICD-10-CM | POA: Diagnosis present

## 2016-05-18 DIAGNOSIS — M160 Bilateral primary osteoarthritis of hip: Secondary | ICD-10-CM | POA: Diagnosis not present

## 2016-05-18 DIAGNOSIS — Z79899 Other long term (current) drug therapy: Secondary | ICD-10-CM | POA: Diagnosis not present

## 2016-05-18 DIAGNOSIS — E871 Hypo-osmolality and hyponatremia: Secondary | ICD-10-CM | POA: Diagnosis not present

## 2016-05-18 DIAGNOSIS — T451X5A Adverse effect of antineoplastic and immunosuppressive drugs, initial encounter: Secondary | ICD-10-CM | POA: Diagnosis present

## 2016-05-18 DIAGNOSIS — K51311 Ulcerative (chronic) rectosigmoiditis with rectal bleeding: Secondary | ICD-10-CM | POA: Diagnosis not present

## 2016-05-18 DIAGNOSIS — G9341 Metabolic encephalopathy: Secondary | ICD-10-CM | POA: Diagnosis present

## 2016-05-18 DIAGNOSIS — K529 Noninfective gastroenteritis and colitis, unspecified: Secondary | ICD-10-CM

## 2016-05-18 DIAGNOSIS — D6481 Anemia due to antineoplastic chemotherapy: Secondary | ICD-10-CM | POA: Diagnosis not present

## 2016-05-18 DIAGNOSIS — Z87891 Personal history of nicotine dependence: Secondary | ICD-10-CM

## 2016-05-18 DIAGNOSIS — C50412 Malignant neoplasm of upper-outer quadrant of left female breast: Secondary | ICD-10-CM | POA: Diagnosis not present

## 2016-05-18 DIAGNOSIS — Z800 Family history of malignant neoplasm of digestive organs: Secondary | ICD-10-CM

## 2016-05-18 DIAGNOSIS — Z7984 Long term (current) use of oral hypoglycemic drugs: Secondary | ICD-10-CM | POA: Diagnosis not present

## 2016-05-18 DIAGNOSIS — Z6836 Body mass index (BMI) 36.0-36.9, adult: Secondary | ICD-10-CM

## 2016-05-18 DIAGNOSIS — R112 Nausea with vomiting, unspecified: Secondary | ICD-10-CM

## 2016-05-18 DIAGNOSIS — D61810 Antineoplastic chemotherapy induced pancytopenia: Secondary | ICD-10-CM | POA: Diagnosis not present

## 2016-05-18 DIAGNOSIS — K625 Hemorrhage of anus and rectum: Secondary | ICD-10-CM

## 2016-05-18 DIAGNOSIS — R197 Diarrhea, unspecified: Secondary | ICD-10-CM | POA: Diagnosis not present

## 2016-05-18 DIAGNOSIS — Z881 Allergy status to other antibiotic agents status: Secondary | ICD-10-CM

## 2016-05-18 DIAGNOSIS — Z171 Estrogen receptor negative status [ER-]: Principal | ICD-10-CM

## 2016-05-18 DIAGNOSIS — Z96643 Presence of artificial hip joint, bilateral: Secondary | ICD-10-CM | POA: Diagnosis present

## 2016-05-18 DIAGNOSIS — R1084 Generalized abdominal pain: Secondary | ICD-10-CM | POA: Diagnosis not present

## 2016-05-18 DIAGNOSIS — E876 Hypokalemia: Secondary | ICD-10-CM | POA: Diagnosis not present

## 2016-05-18 DIAGNOSIS — Z888 Allergy status to other drugs, medicaments and biological substances status: Secondary | ICD-10-CM

## 2016-05-18 DIAGNOSIS — R402362 Coma scale, best motor response, obeys commands, at arrival to emergency department: Secondary | ICD-10-CM | POA: Diagnosis present

## 2016-05-18 DIAGNOSIS — Z803 Family history of malignant neoplasm of breast: Secondary | ICD-10-CM

## 2016-05-18 DIAGNOSIS — R402142 Coma scale, eyes open, spontaneous, at arrival to emergency department: Secondary | ICD-10-CM | POA: Diagnosis present

## 2016-05-18 DIAGNOSIS — D696 Thrombocytopenia, unspecified: Secondary | ICD-10-CM | POA: Diagnosis not present

## 2016-05-18 DIAGNOSIS — Z79891 Long term (current) use of opiate analgesic: Secondary | ICD-10-CM

## 2016-05-18 DIAGNOSIS — M1990 Unspecified osteoarthritis, unspecified site: Secondary | ICD-10-CM | POA: Diagnosis present

## 2016-05-18 DIAGNOSIS — E860 Dehydration: Secondary | ICD-10-CM | POA: Diagnosis not present

## 2016-05-18 DIAGNOSIS — K921 Melena: Secondary | ICD-10-CM | POA: Diagnosis not present

## 2016-05-18 DIAGNOSIS — E663 Overweight: Secondary | ICD-10-CM | POA: Diagnosis present

## 2016-05-18 DIAGNOSIS — J45909 Unspecified asthma, uncomplicated: Secondary | ICD-10-CM | POA: Diagnosis present

## 2016-05-18 DIAGNOSIS — R402252 Coma scale, best verbal response, oriented, at arrival to emergency department: Secondary | ICD-10-CM | POA: Diagnosis present

## 2016-05-18 DIAGNOSIS — C189 Malignant neoplasm of colon, unspecified: Secondary | ICD-10-CM | POA: Diagnosis not present

## 2016-05-18 DIAGNOSIS — Z7982 Long term (current) use of aspirin: Secondary | ICD-10-CM | POA: Diagnosis not present

## 2016-05-18 DIAGNOSIS — K922 Gastrointestinal hemorrhage, unspecified: Secondary | ICD-10-CM | POA: Diagnosis present

## 2016-05-18 DIAGNOSIS — E119 Type 2 diabetes mellitus without complications: Secondary | ICD-10-CM | POA: Diagnosis not present

## 2016-05-18 DIAGNOSIS — Z808 Family history of malignant neoplasm of other organs or systems: Secondary | ICD-10-CM

## 2016-05-18 DIAGNOSIS — K5190 Ulcerative colitis, unspecified, without complications: Secondary | ICD-10-CM | POA: Diagnosis not present

## 2016-05-18 LAB — COMPREHENSIVE METABOLIC PANEL
ALK PHOS: 85 U/L (ref 40–150)
ALT: 19 U/L (ref 0–55)
AST: 23 U/L (ref 5–34)
Albumin: 2.7 g/dL — ABNORMAL LOW (ref 3.5–5.0)
Anion Gap: 14 mEq/L — ABNORMAL HIGH (ref 3–11)
BUN: 21 mg/dL (ref 7.0–26.0)
CO2: 23 meq/L (ref 22–29)
Calcium: 9.2 mg/dL (ref 8.4–10.4)
Creatinine: 1.2 mg/dL — ABNORMAL HIGH (ref 0.6–1.1)
EGFR: 51 mL/min/{1.73_m2} — AB (ref >90)
GLUCOSE: 131 mg/dL (ref 70–140)
POTASSIUM: 3.8 meq/L (ref 3.5–5.1)
Total Bilirubin: 1.21 mg/dL — ABNORMAL HIGH (ref 0.20–1.20)
Total Protein: 6.7 g/dL (ref 6.4–8.3)

## 2016-05-18 LAB — BASIC METABOLIC PANEL
Anion gap: 9 (ref 5–15)
BUN: 19 mg/dL (ref 6–20)
CHLORIDE: 92 mmol/L — AB (ref 101–111)
CO2: 24 mmol/L (ref 22–32)
CREATININE: 1 mg/dL (ref 0.44–1.00)
Calcium: 7.9 mg/dL — ABNORMAL LOW (ref 8.9–10.3)
GFR calc non Af Amer: >60 mL/min (ref >60)
GLUCOSE: 126 mg/dL — AB (ref 65–99)
Potassium: 3.2 mmol/L — ABNORMAL LOW (ref 3.5–5.1)
Sodium: 125 mmol/L — ABNORMAL LOW (ref 135–145)

## 2016-05-18 LAB — CBC WITH DIFFERENTIAL/PLATELET
BASO%: 0.1 % (ref 0.0–2.0)
BASOS ABS: 0 10*3/uL (ref 0.0–0.1)
EOS ABS: 0 10*3/uL (ref 0.0–0.5)
EOS%: 0.1 % (ref 0.0–7.0)
HCT: 38.3 % (ref 34.8–46.6)
HGB: 12.7 g/dL (ref 11.6–15.9)
LYMPH%: 32.5 % (ref 14.0–49.7)
MCH: 28.3 pg (ref 25.1–34.0)
MCHC: 33.1 g/dL (ref 31.5–36.0)
MCV: 85.3 fL (ref 79.5–101.0)
MONO#: 0.2 10*3/uL (ref 0.1–0.9)
MONO%: 8 % (ref 0.0–14.0)
NEUT#: 1.4 10*3/uL — ABNORMAL LOW (ref 1.5–6.5)
NEUT%: 59.3 % (ref 38.4–76.8)
Platelets: 54 10*3/uL — ABNORMAL LOW (ref 145–400)
RBC: 4.49 10*6/uL (ref 3.70–5.45)
RDW: 13.5 % (ref 11.2–14.5)
WBC: 2.4 10*3/uL — ABNORMAL LOW (ref 3.9–10.3)
lymph#: 0.8 10*3/uL — ABNORMAL LOW (ref 0.9–3.3)

## 2016-05-18 LAB — DIFFERENTIAL
Basophils Absolute: 0 10*3/uL (ref 0.0–0.1)
Basophils Relative: 0 %
EOS ABS: 0 10*3/uL (ref 0.0–0.7)
Eosinophils Relative: 0 %
LYMPHS ABS: 0.9 10*3/uL (ref 0.7–4.0)
Lymphocytes Relative: 35 %
MONO ABS: 0.4 10*3/uL (ref 0.1–1.0)
Monocytes Relative: 16 %
NEUTROS ABS: 1.2 10*3/uL — AB (ref 1.7–7.7)
NEUTROS PCT: 49 %

## 2016-05-18 LAB — CBC
HEMATOCRIT: 32.4 % — AB (ref 36.0–46.0)
HEMOGLOBIN: 11.1 g/dL — AB (ref 12.0–15.0)
MCH: 27.7 pg (ref 26.0–34.0)
MCHC: 34.3 g/dL (ref 30.0–36.0)
MCV: 80.8 fL (ref 78.0–100.0)
Platelets: 36 10*3/uL — ABNORMAL LOW (ref 150–400)
RBC: 4.01 MIL/uL (ref 3.87–5.11)
RDW: 12.8 % (ref 11.5–15.5)
WBC: 2.5 10*3/uL — ABNORMAL LOW (ref 4.0–10.5)

## 2016-05-18 LAB — MAGNESIUM: Magnesium: 1.4 mg/dL — ABNORMAL LOW (ref 1.7–2.4)

## 2016-05-18 LAB — GLUCOSE, CAPILLARY: GLUCOSE-CAPILLARY: 115 mg/dL — AB (ref 65–99)

## 2016-05-18 LAB — PHOSPHORUS: PHOSPHORUS: 2.8 mg/dL (ref 2.5–4.6)

## 2016-05-18 LAB — TYPE AND SCREEN
ABO/RH(D): O POS
ANTIBODY SCREEN: NEGATIVE

## 2016-05-18 LAB — I-STAT CG4 LACTIC ACID, ED: LACTIC ACID, VENOUS: 1.72 mmol/L (ref 0.5–1.9)

## 2016-05-18 MED ORDER — ONDANSETRON HCL 4 MG/2ML IJ SOLN
8.0000 mg | Freq: Once | INTRAMUSCULAR | Status: AC
  Administered 2016-05-18: 8 mg via INTRAVENOUS

## 2016-05-18 MED ORDER — MAGNESIUM SULFATE 2 GM/50ML IV SOLN
2.0000 g | Freq: Once | INTRAVENOUS | Status: AC
  Administered 2016-05-18: 2 g via INTRAVENOUS
  Filled 2016-05-18: qty 50

## 2016-05-18 MED ORDER — SODIUM CHLORIDE 0.9 % IV SOLN
INTRAVENOUS | Status: DC
  Administered 2016-05-18: 13:00:00 via INTRAVENOUS

## 2016-05-18 MED ORDER — ACETAMINOPHEN 325 MG PO TABS
650.0000 mg | ORAL_TABLET | Freq: Once | ORAL | Status: AC
  Administered 2016-05-18: 650 mg via ORAL
  Filled 2016-05-18: qty 2

## 2016-05-18 MED ORDER — SODIUM CHLORIDE 0.9 % IV SOLN
Freq: Once | INTRAVENOUS | Status: AC
  Administered 2016-05-18: 17:00:00 via INTRAVENOUS

## 2016-05-18 MED ORDER — ONDANSETRON HCL 4 MG/2ML IJ SOLN
INTRAMUSCULAR | Status: AC
  Filled 2016-05-18: qty 4

## 2016-05-18 MED ORDER — SODIUM CHLORIDE 0.9 % IV SOLN: Freq: Once | INTRAVENOUS | Status: DC

## 2016-05-18 MED ORDER — SODIUM CHLORIDE 0.9 % IV BOLUS (SEPSIS)
1000.0000 mL | Freq: Once | INTRAVENOUS | Status: AC
  Administered 2016-05-18: 1000 mL via INTRAVENOUS

## 2016-05-18 MED ORDER — MORPHINE SULFATE (PF) 4 MG/ML IV SOLN
4.0000 mg | Freq: Once | INTRAVENOUS | Status: AC
  Administered 2016-05-18: 4 mg via INTRAVENOUS
  Filled 2016-05-18: qty 1

## 2016-05-18 MED ORDER — INSULIN ASPART 100 UNIT/ML ~~LOC~~ SOLN
0.0000 [IU] | SUBCUTANEOUS | Status: DC
  Administered 2016-05-19 – 2016-05-20 (×2): 1 [IU] via SUBCUTANEOUS
  Administered 2016-05-20: 2 [IU] via SUBCUTANEOUS
  Administered 2016-05-21: 1 [IU] via SUBCUTANEOUS
  Administered 2016-05-22: 3 [IU] via SUBCUTANEOUS
  Administered 2016-05-22: 2 [IU] via SUBCUTANEOUS
  Administered 2016-05-22: 1 [IU] via SUBCUTANEOUS
  Administered 2016-05-22 – 2016-05-23 (×4): 2 [IU] via SUBCUTANEOUS
  Administered 2016-05-23: 1 [IU] via SUBCUTANEOUS
  Administered 2016-05-23 – 2016-05-24 (×3): 2 [IU] via SUBCUTANEOUS

## 2016-05-18 MED ORDER — SODIUM CHLORIDE 0.9 % IV SOLN
Freq: Once | INTRAVENOUS | Status: AC
  Administered 2016-05-18: 10 mL via INTRAVENOUS

## 2016-05-18 MED ORDER — FAMOTIDINE IN NACL 20-0.9 MG/50ML-% IV SOLN
20.0000 mg | Freq: Once | INTRAVENOUS | Status: AC
  Administered 2016-05-18: 20 mg via INTRAVENOUS
  Filled 2016-05-18: qty 50

## 2016-05-18 NOTE — ED Notes (Signed)
Requested urine from patient. 

## 2016-05-18 NOTE — H&P (Signed)
Brandi Dickson QIO:[id number] DOB: 1960/07/19 DOA: 05/18/2016     PCP: Orpah Melter, MD   Outpatient Specialists: [facility]  Patient coming from:  home Lives   With family    Chief Complaint: Vomiting diarrhea  HPI: Brandi Dickson is a 56 y.o. female with medical history significant of 42 diagnosed breast cancer, DM 2 asthma, HTN, neuropathy secondary to diabetes, ulcerative colitis    Presented with 5 days of nausea vomiting and diarrhea she went to a pot luck with her husband and after that both of them developed diarrhea she later on developed some nausea and vomiting. Patient status post chemotherapy on April 13 for breast cancer she was told that this will likely to cause nausea vomiting and diarrhea as well seen for free per patient has undergone Pegfilgrastim injection Attempted to use some Imodium 2 days ago she had an episode of blood in the bowl. She has history of known colitis. She is concerned that her chemotherapy may have aggravated it she's not been eating much she called oncology and was told to use Lomotil. Her diarrhea has become more severe habit 45 right knee bowel movements this morning. The bleeding has decreased. Presented today to oncology clinic on 1 L normal saline and Zofran need to decline and had some signs of confusion labs were done in sodium show to be 125 at which point is send her to emergency department.  Note she was seen in the emergency department 5 days ago for chest tightness and shortness of breath had a CTA done showing no evidence of PE   Regarding pertinent Chronic problems: In March 2018 diagnosed with upper outer quadrant of the left breast mass concerning for invasive carcinoma that on neoadjuvant chemotherapy and 13th of April 2018with  Docetaxel, Carboplatin, Herceptin and pejeta (TCHP) every 3 weeks Plan for total of 6 cycles, followed by Herceptin and perjeta maintenance therapy to complete 1  year treatment. She had a port placed on 05/08/16.  Brandi Dickson has history of colitis followed by GI Echo 05/09/16 -LF EF: 55-60%  IN ER:  Temp (24hrs), Avg:97.8 F (36.6 C), Min:97.7 F (36.5 C), Max:97.9 F (36.6 C)     RR19 96 %Hr 116 Bp 115/71 LA 1.72 WBC 2.5  Hg 11.1 at baseline 13.6 PLT 36 down from 54 today and down from 260 at baseline NA 125 K 3.2 Cr 1.00 Mg 1.4  Following Medications were ordered in ER: Medications  magnesium sulfate IVPB 2 g 50 mL (not administered)  sodium chloride 0.9 % bolus 1,000 mL (not administered)  0.9 %  sodium chloride infusion ( Intravenous New Bag/Given 05/18/16 1717)  morphine 4 MG/ML injection 4 mg (4 mg Intravenous Given 05/18/16 1718)  famotidine (PEPCID) IVPB 20 mg premix (0 mg Intravenous Stopped 05/18/16 1736)      Hospitalist was called for admission for dehydration secondary to nausea vomiting diarrhea with associated hyponatremia hypokalemia and hypomagnesemia with recent blood per rectum in the setting of new onset of thrombocytopenia light the secondary to chemotherapy  Review of Systems:    Pertinent positives include: abdominal pain, nausea, vomiting, diarrhea,  blood in stool, Constitutional:  No weight loss, night sweats, Fevers, chills, fatigue, weight loss  HEENT:  No headaches, Difficulty swallowing,Tooth/dental problems,Sore throat,  No sneezing, itching, ear ache, nasal congestion, post nasal drip,  Cardio-vascular:  No chest pain, Orthopnea, PND, anasarca, dizziness, palpitations.no Bilateral lower extremity swelling  GI:  No heartburn, indigestion, change in  bowel habits, loss of appetite, melena,  hematemesis Resp:  no shortness of breath at rest. No dyspnea on exertion, No excess mucus, no productive cough, No non-productive cough, No coughing up of blood.No change in color of mucus.No wheezing. Skin:  no rash or lesions. No jaundice GU:  no dysuria, change in color of urine, no urgency or frequency. No straining to  urinate.  No flank pain.  Musculoskeletal:  No joint pain or no joint swelling. No decreased range of motion. No back pain.  Psych:  No change in mood or affect. No depression or anxiety. No memory loss.  Neuro: no localizing neurological complaints, no tingling, no weakness, no double vision, no gait abnormality, no slurred speech, no confusion  As per HPI otherwise 10 point review of systems negative.   Past Medical History: Past Medical History:  Diagnosis Date  . Arthritis   . Asthma    triggered with [name] perfumes and cigarette smoke  . Cancer ([place])   . Colitis   . Diabetes mellitus without complication ([place])   . Hypertension   . Neuropathy    Past Surgical History:  Procedure Laterality Date  . ABDOMINAL HYSTERECTOMY    . DILATION AND CURETTAGE OF UTERUS    . PORTACATH PLACEMENT Right 05/08/2016   Procedure: INSERTION PORT-A-CATH WITH [place];  Surgeon: [name], MD;  Location: [place];  Service: General;  Laterality: Right;  . TONSILLECTOMY    . TOTAL HIP ARTHROPLASTY Right   . TOTAL HIP ARTHROPLASTY Left 03/06/2016   Procedure: LEFT TOTAL HIP ARTHROPLASTY ANTERIOR APPROACH;  Surgeon: [name], MD;  Location: [facility];  Service: Orthopedics;  Laterality: Left;     Social History:  Ambulatory  independently      reports that she quit smoking about 13 years ago. Her smoking use included Cigarettes. She has a 29.00 pack-year smoking history. She has never used smokeless tobacco. She reports that she does not drink alcohol or use drugs.  Allergies:   Allergies  Allergen Reactions  . Lisinopril Palpitations  . Augmentin [Amoxicillin-Pot Clavulanate] Other (See Comments)    sts gives her a yeast infection Has patient had a PCN reaction causing immediate rash, facial/tongue/throat swelling, SOB or lightheadedness with hypotension: no Has patient had a PCN reaction causing severe rash involving mucus membranes or skin necrosis: no Has patient  had a PCN reaction that required hospitalization no Has patient had a PCN reaction occurring within the last 10 years: unknown If all of the above answers are "NO", then may proceed with Cephalosporin use.   . Benadryl [Diphenhydramine] Itching and Anxiety    Per pt: "Makes my skin crawl"; makes pt sensitive to touch       Family History:   Family History  Problem Relation Age of Onset  . Colon cancer Father   . Stomach cancer Paternal Uncle   . Stomach cancer Paternal Uncle   . Melanoma Brother   . Thyroid cancer Brother   . Breast cancer Maternal Aunt   . Breast cancer Maternal Aunt   . Breast cancer Cousin     Medications: Prior to Admission medications   Medication Sig Start Date End Date Taking? Authorizing Provider  albuterol (PROVENTIL HFA;VENTOLIN HFA) 108 (90 Base) MCG/ACT inhaler Inhale 1-2 puffs into the lungs every 6 (six) hours as needed for wheezing or shortness of breath.   Yes Historical Provider, MD  ASPIRIN 81 PO Take by mouth.   Yes Historical Provider, MD  dexamethasone (DECADRON)  4 MG tablet Take 1 tablet (4 mg total) by mouth daily. Start the day before Taxotere. Then again the day after chemo for 3 days. 05/02/16  Yes Truitt Merle, MD  diphenoxylate-atropine (LOMOTIL) 2.5-0.025 MG tablet Take one to two tablets every six hours as needed for diarrhea 05/16/16  Yes Truitt Merle, MD  ferrous sulfate (FERROUSUL) 325 (65 FE) MG tablet Take 1 tablet (325 mg total) by mouth 3 (three) times daily with meals. 03/06/16  Yes Danae Orleans, PA-C  gabapentin (NEURONTIN) 100 MG capsule Take 200 mg by mouth at bedtime.    Yes Historical Provider, MD  lidocaine-prilocaine (EMLA) cream Apply 1 application to skin 1.5 to 2 hrs before use.  Cover to secure cream with plastic wrap. 05/10/16  Yes Truitt Merle, MD  loperamide (IMODIUM) 1 MG/5ML solution Take 3 mg by mouth as needed for diarrhea or loose stools.   Yes Historical Provider, MD  LORazepam (ATIVAN) 0.5 MG tablet Take 1 tablet (0.5 mg  total) by mouth once as needed for anxiety. 05/02/16  Yes Truitt Merle, MD  Mesalamine (ASACOL HD) 800 MG TBEC Take 1 tablet (800 mg total) by mouth 3 (three) times daily. 04/30/16  Yes Ladene Artist, MD  metFORMIN (GLUCOPHAGE-XR) 500 MG 24 hr tablet Take 1,000 mg by mouth at bedtime.   Yes Historical Provider, MD  metoprolol (LOPRESSOR) 50 MG tablet Take 50 mg by mouth at bedtime.    Yes Historical Provider, MD  niacin 250 MG tablet Take 250 mg by mouth at bedtime.    Yes Historical Provider, MD  ondansetron (ZOFRAN) 8 MG tablet Take 1 tablet (8 mg total) by mouth 2 (two) times daily as needed for refractory nausea / vomiting. Start on day 3 after chemo. 05/02/16  Yes Truitt Merle, MD  prochlorperazine (COMPAZINE) 10 MG tablet Take 1 tablet (10 mg total) by mouth every 6 (six) hours as needed (Nausea or vomiting). 05/02/16  Yes Truitt Merle, MD  Specialty Vitamins Products (VITAMINS FOR THE HAIR) TABS Take 2 tablets by mouth 2 (two) times daily.   Yes Historical Provider, MD  traMADol (ULTRAM) 50 MG tablet Take 1-2 tablets (50-100 mg total) by mouth every 6 (six) hours as needed. 03/06/16  Yes Danae Orleans, PA-C  triamterene-hydrochlorothiazide (MAXZIDE) 75-50 MG per tablet Take 0.5 tablets by mouth daily.    Yes Historical Provider, MD    Physical Exam: Patient Vitals for the past 24 hrs:  BP Temp Temp src Pulse Resp SpO2 Height Weight  05/18/16 1855 115/71 - - (!) 116 19 96 % - -  05/18/16 1819 126/66 - - (!) 110 19 96 % - -  05/18/16 1720 114/72 - - (!) 124 (!) 23 97 % - -  05/18/16 1645 114/72 - - (!) 121 (!) 24 96 % - -  05/18/16 1600 124/75 - - (!) 121 (!) 21 91 % - -  05/18/16 1554 117/77 - - (!) 121 20 97 % - -  05/18/16 1446 (!) 144/91 97.7 F (36.5 C) Oral (!) 127 18 99 % 5' 4"  (1.626 m) 96.6 kg (213 lb)    1. General:  in No Acute distress 2. Psychological: Alert and  Oriented 3. Head/ENT:     Dry Mucous Membranes                          Head Non traumatic, neck supple  Normal   Dentition 4. SKIN:  decreased Skin turgor,  Skin clean Dry and intact no rash 5. Heart: Regular rate and rhythm no Murmur, Rub or gallop 6. Lungs: Clear to auscultation bilaterally, no wheezes or crackles   7. Abdomen: Soft,  non-tender, Non distended 8. Lower extremities: no clubbing, cyanosis, or edema 9. Neurologically Grossly intact, moving all 4 extremities equally  t 10. MSK: Normal range of motion   body mass index is 36.56 kg/m.  Labs on Admission:   Labs on Admission: I have personally reviewed following labs and imaging studies  CBC:  Recent Labs Lab 05/13/16 1419 05/18/16 1238 05/18/16 1653  WBC 6.3 2.4* 2.5*  NEUTROABS  --  1.4*  --   HGB 13.6 12.7 11.1*  HCT 40.8 38.3 32.4*  MCV 84.8 85.3 80.8  PLT 260 54* 36*   Basic Metabolic Panel:  Recent Labs Lab 05/13/16 1419 05/18/16 1238 05/18/16 1653  NA 136 125 Repeated and Verified* 125*  K 4.2 3.8 3.2*  CL 103  --  92*  CO2 24 23 24   GLUCOSE 123* 131 126*  BUN 17 21.0 19  CREATININE 0.78 1.2* 1.00  CALCIUM 9.1 9.2 7.9*  MG  --   --  1.4*   GFR: Estimated Creatinine Clearance: 70.1 mL/min (by C-G formula based on SCr of 1 mg/dL). Liver Function Tests:  Recent Labs Lab 05/13/16 1419 05/18/16 1238  AST 26 23  ALT 26 19  ALKPHOS 70 85  BILITOT 0.8 1.21*  PROT 7.9 6.7  ALBUMIN 3.8 2.7*   No results for input(s): LIPASE, AMYLASE in the last 168 hours. No results for input(s): AMMONIA in the last 168 hours. Coagulation Profile: No results for input(s): INR, PROTIME in the last 168 hours. Cardiac Enzymes:  Recent Labs Lab 05/13/16 1431  TROPONINI <0.03   BNP (last 3 results) No results for input(s): PROBNP in the last 8760 hours. HbA1C: No results for input(s): HGBA1C in the last 72 hours. CBG: No results for input(s): GLUCAP in the last 168 hours. Lipid Profile: No results for input(s): CHOL, HDL, LDLCALC, TRIG, CHOLHDL, LDLDIRECT in the last 72 hours. Thyroid Function  Tests: No results for input(s): TSH, T4TOTAL, FREET4, T3FREE, THYROIDAB in the last 72 hours. Anemia Panel: No results for input(s): VITAMINB12, FOLATE, FERRITIN, TIBC, IRON, RETICCTPCT in the last 72 hours. Urine analysis:  Sepsis Labs: @LABRCNTIP (procalcitonin:4,lacticidven:4) )No results found for this or any previous visit (from the past 240 hour(s)).    UA  not ordered  Lab Results  Component Value Date   HGBA1C 5.7 (H) 02/27/2016    Estimated Creatinine Clearance: 70.1 mL/min (by C-G formula based on SCr of 1 mg/dL).  BNP (last 3 results) No results for input(s): PROBNP in the last 8760 hours.   ECG REPORT  Independently reviewed Rate: 112  Rhythm: sinus tachycardia ST&T Change: No acute ischemic changes   QTC 475  Filed Weights   05/18/16 1446  Weight: 96.6 kg (213 lb)     Cultures: No results found for: SDES, SPECREQUEST, CULT, REPTSTATUS   Radiological Exams on Admission: No results found.  Chart has been reviewed    Assessment/Plan  56 y.o. female with medical history significant of 68 diagnosed breast cancer, DM 2 asthma, HTN, neuropathy secondary to diabetes, colitis  admitted for dehydration secondary to nausea vomiting diarrhea with associated hyponatremia hypokalemia and hypomagnesemia with recent blood per rectum in the setting of new onset of thrombocytopenia light the secondary to chemotherapy   Present  on Admission:  . Hyponatremia - most likely secondary to hypovolemic hyponatremia secondary to fluid depletion and dehydration will obtain urine electrolytes rehydrate and follow sodium closely  . Hypokalemia - replace . Hypomagnesemia- replace . Dehydration - administer IV fluids and rehydrate . Blood in stool - likely exacerbation of ulcerative colitis GI aware . Thrombocytopenia ([place]) - most likely secondary to thrombocytopenia hematology aware we'll transfuse given ongoing GI bleed . GI bleeding Most likely secondary to ulcerative  colitis, lower GI bleed we will transfuse platelets order  serial CB doubt upper GI bleed hold off on Protonix drip instead order twice a day . Breast cancer of upper-outer quadrant of left female breast [facility])- discuss with hematology oncology aware of the patient Diarrhea most likely secondary to ulcerative colitis but also have had recent possibly food poisoning will obtain Gi panel DM 2 neuropathy - continue Neurontin order sliding scale hold by mouth medications Other plan as per orders.  DVT prophylaxis:  SCD      Code Status:  FULL CODE as per patient    Family Communication:   Family  at  Bedside  plan of care was discussed with   Husband,    Disposition Plan:      To home once workup is complete and patient is stable                              Consults called: Gi LB, hematology     Admission status:  inpatient       Level of care    tele           I have spent a total of 76 min on this admission  extra time was spent to discuss case with consultants  Reyli Schroth 05/19/2016, 12:46 AM    [facility]  Pager [phone]   after 2 AM please page floor coverage PA If 7AM-7PM, please contact the day team taking care of the patient  [email or web address]  Password [email or web address]

## 2016-05-18 NOTE — Patient Instructions (Signed)
Implanted Port Home Guide An implanted port is a type of central line that is placed under the skin. Central lines are used to provide IV access when treatment or nutrition needs to be given through a person's veins. Implanted ports are used for long-term IV access. An implanted port may be placed because:  You need IV medicine that would be irritating to the small veins in your hands or arms.  You need long-term IV medicines, such as antibiotics.  You need IV nutrition for a long period.  You need frequent blood draws for lab tests.  You need dialysis.  Implanted ports are usually placed in the chest area, but they can also be placed in the upper arm, the abdomen, or the leg. An implanted port has two main parts:  Reservoir. The reservoir is round and will appear as a small, raised area under your skin. The reservoir is the part where a needle is inserted to give medicines or draw blood.  Catheter. The catheter is a thin, flexible tube that extends from the reservoir. The catheter is placed into a large vein. Medicine that is inserted into the reservoir goes into the catheter and then into the vein.  How will I care for my incision site? Do not get the incision site wet. Bathe or shower as directed by your health care provider. How is my port accessed? Special steps must be taken to access the port:  Before the port is accessed, a numbing cream can be placed on the skin. This helps numb the skin over the port site.  Your health care provider uses a sterile technique to access the port. ? Your health care provider must put on a mask and sterile gloves. ? The skin over your port is cleaned carefully with an antiseptic and allowed to dry. ? The port is gently pinched between sterile gloves, and a needle is inserted into the port.  Only "non-coring" port needles should be used to access the port. Once the port is accessed, a blood return should be checked. This helps ensure that the port  is in the vein and is not clogged.  If your port needs to remain accessed for a constant infusion, a clear (transparent) bandage will be placed over the needle site. The bandage and needle will need to be changed every week, or as directed by your health care provider.  Keep the bandage covering the needle clean and dry. Do not get it wet. Follow your health care provider's instructions on how to take a shower or bath while the port is accessed.  If your port does not need to stay accessed, no bandage is needed over the port.  What is flushing? Flushing helps keep the port from getting clogged. Follow your health care provider's instructions on how and when to flush the port. Ports are usually flushed with saline solution or a medicine called heparin. The need for flushing will depend on how the port is used.  If the port is used for intermittent medicines or blood draws, the port will need to be flushed: ? After medicines have been given. ? After blood has been drawn. ? As part of routine maintenance.  If a constant infusion is running, the port may not need to be flushed.  How long will my port stay implanted? The port can stay in for as long as your health care provider thinks it is needed. When it is time for the port to come out, surgery will be   done to remove it. The procedure is similar to the one performed when the port was put in. When should I seek immediate medical care? When you have an implanted port, you should seek immediate medical care if:  You notice a bad smell coming from the incision site.  You have swelling, redness, or drainage at the incision site.  You have more swelling or pain at the port site or the surrounding area.  You have a fever that is not controlled with medicine.  This information is not intended to replace advice given to you by your health care provider. Make sure you discuss any questions you have with your health care provider. Document  Released: 01/15/2005 Document Revised: 06/23/2015 Document Reviewed: 09/22/2012 Elsevier Interactive Patient Education  2017 [employer]  

## 2016-05-18 NOTE — Progress Notes (Signed)
Pt arrived to tx area for flush and labs to be drawn. Accessed pt PAC and drew labs.  Pt complained of extreme nausea, vomiting and diarrhea for the past several days. Charlestine Massed NP notified,  Order for Zofran and 1 L NS to be admin.(both administered according to [facility]) in Tx area. Mendel Ryder to assess pt in tx area. Will continue to monitor.   1400 pt experiencing nausea and diarrhea.With pt in bathroom for majority of time in tx area, as not to leave her alone, and make sure she is safe.  Pt is finding it hard to find/use the correct words. Pt is experiencing confusion. She does not know the year, does know she is in the [facility]  Paged Mendel Ryder, also left this new information on her voicemail.  Charlestine Massed NP assessed pt in tx area. Pt brought to ED@[id number] , via wheelchair.

## 2016-05-18 NOTE — ED Triage Notes (Addendum)
Per [place] pt sent for evaluation of abdominal pain with associated n/v/d for 2 days; pt sodium level 125. Pt received chemotherapy 8 days ago for breast cancer. Pt has been given 1000 ml bolus of 0.9% sodium chloride and 8 mg of zofran at [facility].

## 2016-05-18 NOTE — ED Notes (Signed)
Patient taken upstairs 

## 2016-05-18 NOTE — Telephone Encounter (Signed)
Received call from pt stating that she is having diarrhea all the time.  She reports going through 4 pairs of pants this am.  When asked how many times she has had diarrhea this am, she reports 4-5 times.  She states the bleeding is less.  She took lomotil @ 10-10:30 am & took imodium sometime before that.  She states she wants to come for her appt but doesn't know how she can manage.  Discussed with Dr Burr Medico who states to take imodium now & repeat lomotil in 1 hour & try to come in.  This info was given to pt & also to hold solid foods & push clear liquids.  She expressed understanding & will follow directions.  Message routed to [facility] NP

## 2016-05-18 NOTE — Progress Notes (Signed)
[place]  [phone] [phone]  Clinic Follow Up Note   Patient Care Team: Orpah Melter, MD as PCP - General (Family Medicine) [name], MD as Consulting Physician (General Surgery) Truitt Merle, MD as Consulting Physician (Hematology) Gery Pray, MD as Consulting Physician (Radiation Oncology) Ladene Artist, MD as Consulting Physician (Gastroenterology) 05/18/2016  CHIEF COMPLAINTS:  Follow up left breast cancer  Oncology History   Cancer Staging Breast cancer of upper-outer quadrant of left female breast [facility]) Staging form: Breast, AJCC 8th Edition - Clinical stage from 04/25/2016: Stage IIA (cT2, cN0, cM0, G3, ER: Negative, PR: Negative, HER2: Positive) - Signed by Truitt Merle, MD on 05/02/2016       Breast cancer of upper-outer quadrant of left female breast ([place])   04/24/2016 Mammogram    Category B breasts with a new irregular mass in the UOQ left breast middle depth. Ultrasound revealed a 3.9 cm mass in the 1:00 position. The left axilla was negative.       04/25/2016 Initial Biopsy    Biopsy of the left breast showed grade 3 invasive ductal carcinoma, DCIS, and lymphovascular invasion was present.      04/25/2016 Receptors her2    ER 0% negative, PR 0% negative, HER2 positive, Ki67 30%      05/02/2016 Initial Diagnosis    Breast cancer of upper-outer quadrant of left female breast ([place])     05/07/2016 Imaging    MRI of the bilateral breast 05/07/16 IMPRESSION: Lobulated enhancing mass (4.0 x 2.7 x 3.1 cm) in the upper-outer quadrant of the left breast corresponding with the recently diagnosed invasive mammary carcinoma. Linear enhancement extends 2.6 cm posterior to the mass worrisome for ductal carcinoma in-situ.      05/09/2016 Echocardiogram    Echo 05/09/16 -LF EF: 55-60%      05/11/2016 -  Neo-Adjuvant Chemotherapy    Docetaxel, Carboplatin, Herceptin and pejeta (TCHP) every 3 weeks, for total of 6 cycles, followed by  Herceptin and perjeta maintenance therapy to complete 1 year treatment.       HISTORY OF PRESENTING ILLNESS:  Brandi Dickson 56 y.o. female is here because of a new diagnosis of left breast cancer. She is accompanied by her husband to our multidisciplinary breast clinic today.  The patient presented with a palpable left breast lump approximately 2 weeks ago. Bilateral diagnostic mammogram on 04/24/16 showed Category B breasts with a new irregular mass in the UOQ left breast middle depth. Ultrasound performed on 04/24/16 revealed a 3.9 cm mass in the 1:00 position. The left axilla was negative.  Biopsy of the left breast on 04/25/16 showed grade 3 invasive ductal carcinoma, DCIS, and lymphovascular invasion was present (ER 0% negative, PR 0% negative, HER2 positive, Ki67 30%).  She denies tenderness of the biopsied area. Reports minor dimpling. The patient is 8 weeks out from a left hip replacement. She had a right hip replacement 2010 years ago.  The patient and her husband present today in multidisciplinary breast clinic to discuss treatment options for the management of her disease.  The patient is taking Neurontin for neuropathy in her feet from diabetes. She has been diagnosed for diabetes for the past 4 years. She states she only has neuropathy at night and take 2 Neurontin at night and then has no symptoms.  GYN HISTORY  Menarchal: 9 LMP: Complete hysterectomy for uterine fibroids ~ 2009. She was bleeding too much with her last menstrual cycle lasting 6 weeks. No issues with  menopause. Contraceptive: no  HRT: No GP: G1P0  CURRENT THERAPY: Docetaxel, Carboplatin, Herceptin and pejeta (TCHP) every 3 weeks, for total of 6 cycles, followed by Herceptin and perjeta maintenance therapy to complete 1 year treatment starting 05/11/16  INTERVAL HISTORY: Brandi Dickson returns for follow up with her husband after receiving her first cycle Docetaxel, Carboplatin, Herceptin and pejeta  (TCHP).  She is day 8 of treatment and unfortunately over the past 2 days has had significant nausea, vomiting and diarrhea.  She has taken anti-emetics without relief and has been taking loperamide and lomotil without any improvement in her diarrhea.  She has been unable to keep any po foods or liquids down.  She has stomach pain, but no fevers, chills.   MEDICAL HISTORY:  Past Medical History:  Diagnosis Date  . Arthritis   . Asthma    triggered with [name] perfumes and cigarette smoke  . Cancer ([place])   . Colitis   . Diabetes mellitus without complication ([place])   . Hypertension   . Neuropathy     SURGICAL HISTORY: Past Surgical History:  Procedure Laterality Date  . ABDOMINAL HYSTERECTOMY    . DILATION AND CURETTAGE OF UTERUS    . PORTACATH PLACEMENT Right 05/08/2016   Procedure: INSERTION PORT-A-CATH WITH [place];  Surgeon: [name], MD;  Location: [facility];  Service: General;  Laterality: Right;  . TONSILLECTOMY    . TOTAL HIP ARTHROPLASTY Right   . TOTAL HIP ARTHROPLASTY Left 03/06/2016   Procedure: LEFT TOTAL HIP ARTHROPLASTY ANTERIOR APPROACH;  Surgeon: [name], MD;  Location: [facility];  Service: Orthopedics;  Laterality: Left;    SOCIAL HISTORY: Social History   Social History  . Marital status: Married    Spouse name: N/A  . Number of children: N/A  . Years of education: N/A   Occupational History  . Not on file.   Social History Main Topics  . Smoking status: Former Smoker    Packs/day: 1.00    Years: 29.00    Types: Cigarettes    Quit date: 10/27/2002  . Smokeless tobacco: Never Used  . Alcohol use No  . Drug use: No  . Sexual activity: Yes    Birth control/ protection: Surgical   Other Topics Concern  . Not on file   Social History Narrative  . No narrative on file    FAMILY HISTORY: Family History  Problem Relation Age of Onset  . Colon cancer Father   . Stomach cancer Paternal Uncle   . Stomach cancer Paternal Uncle     . Melanoma Brother   . Thyroid cancer Brother   . Breast cancer Maternal Aunt   . Breast cancer Maternal Aunt   . Breast cancer Cousin     ALLERGIES:  is allergic to lisinopril; augmentin [amoxicillin-pot clavulanate]; and benadryl [diphenhydramine].  MEDICATIONS:  Current Outpatient Prescriptions  Medication Sig Dispense Refill  . albuterol (PROVENTIL HFA;VENTOLIN HFA) 108 (90 Base) MCG/ACT inhaler Inhale 1-2 puffs into the lungs every 6 (six) hours as needed for wheezing or shortness of breath.    . ASPIRIN 81 PO Take by mouth.    . dexamethasone (DECADRON) 4 MG tablet Take 1 tablet (4 mg total) by mouth daily. Start the day before Taxotere. Then again the day after chemo for 3 days. 30 tablet 1  . diphenoxylate-atropine (LOMOTIL) 2.5-0.025 MG tablet Take one to two tablets every six hours as needed for diarrhea 30 tablet 1  . ferrous sulfate (FERROUSUL) 325 (  65 FE) MG tablet Take 1 tablet (325 mg total) by mouth 3 (three) times daily with meals.    . gabapentin (NEURONTIN) 100 MG capsule Take 200 mg by mouth at bedtime.     . lidocaine-prilocaine (EMLA) cream Apply 1 application to skin 1.5 to 2 hrs before use.  Cover to secure cream with plastic wrap. 30 g PRN  . loperamide (IMODIUM) 1 MG/5ML solution Take 3 mg by mouth as needed for diarrhea or loose stools.    [name] Kitchen LORazepam (ATIVAN) 0.5 MG tablet Take 1 tablet (0.5 mg total) by mouth once as needed for anxiety. 2 tablet 0  . Mesalamine (ASACOL HD) 800 MG TBEC Take 1 tablet (800 mg total) by mouth 3 (three) times daily. 90 tablet 5  . metFORMIN (GLUCOPHAGE-XR) 500 MG 24 hr tablet Take 1,000 mg by mouth at bedtime.    . metoprolol (LOPRESSOR) 50 MG tablet Take 50 mg by mouth 2 (two) times daily.    . niacin 250 MG tablet Take 250 mg by mouth daily before breakfast.    . ondansetron (ZOFRAN) 8 MG tablet Take 1 tablet (8 mg total) by mouth 2 (two) times daily as needed for refractory nausea / vomiting. Start on day 3 after chemo. 30 tablet  1  . prochlorperazine (COMPAZINE) 10 MG tablet Take 1 tablet (10 mg total) by mouth every 6 (six) hours as needed (Nausea or vomiting). 30 tablet 1  . Specialty Vitamins Products (VITAMINS FOR THE HAIR) TABS Take 2 tablets by mouth 2 (two) times daily.    . traMADol (ULTRAM) 50 MG tablet Take 1-2 tablets (50-100 mg total) by mouth every 6 (six) hours as needed. 40 tablet 0  . triamterene-hydrochlorothiazide (MAXZIDE) 75-50 MG per tablet Take 0.5 tablets by mouth daily.      Current Facility-Administered Medications  Medication Dose Route Frequency Provider Last Rate Last Dose  . 0.9 %  sodium chloride infusion   Intravenous Continuous Gardenia Phlegm, NP 500 mL/hr at 05/18/16 1325      REVIEW OF SYSTEMS:   Difficulty to do ROS due to increased word finding ability.  No fevers or chills.  No chest pain, body aches, muscle aches.  No shortness of breath  PHYSICAL EXAMINATION: ECOG PERFORMANCE STATUS: 1 - Symptomatic but completely ambulatory 98/73, HR 135, o2 98%, T 97.9 There were no vitals filed for this visit. There were no vitals filed for this visit. GENERAL: Patient appears unwell and acutely ill HEENT:  Sclerae anicteric.  Oropharynx dry. No ulcerations or evidence of oropharyngeal candidiasis.   NODES:  No cervical, supraclavicular, or axillary lymphadenopathy palpated.  BREAST EXAM:  Deferred. LUNGS:  Clear to auscultation bilaterally.  No wheezes or rhonchi. HEART:  Regular rate and rhythm. No murmur appreciated. ABDOMEN:  Soft, nontender.  Positive, normoactive bowel sounds. No organomegaly palpated. MSK:  No focal spinal tenderness to palpation. Full range of motion bilaterally in the upper extremities. EXTREMITIES:  No peripheral edema.   SKIN:  Clear with no obvious rashes or skin changes. No nail dyscrasia. NEURO:  Nonfocal. Disoriented to time, difficulty with word finding ability  Appropriate affect.   LABORATORY DATA:  I have reviewed the data as listed CBC  Latest Ref Rng & Units 05/18/2016 05/13/2016 05/10/2016  WBC 3.9 - 10.3 10e3/uL 2.4(L) 6.3 7.6  Hemoglobin 11.6 - 15.9 g/dL 12.7 13.6 12.6  Hematocrit 34.8 - 46.6 % 38.3 40.8 39.2  Platelets 145 - 400 10e3/uL 54(L) 260 276   CMP Latest Ref  Rng & Units 05/18/2016 05/13/2016 05/10/2016  Glucose 70 - 140 mg/dl 131 123(H) 92  BUN 7.0 - 26.0 mg/dL 21.0 17 12.8  Creatinine 0.6 - 1.1 mg/dL 1.2(H) 0.78 0.8  Sodium 136 - 145 mEq/L 125 Repeated and Verified(L) 136 140  Potassium 3.5 - 5.1 mEq/L 3.8 4.2 3.9  Chloride 101 - 111 mmol/L - 103 -  CO2 22 - 29 mEq/L _0 Calcium 8.4 - 10.4 mg/dL 9.2 9.1 9.3  Total Protein 6.4 - 8.3 g/dL 6.7 7.9 7.2  Total Bilirubin 0.20 - 1.20 mg/dL 1.21(H) 0.8 0.39  Alkaline Phos 40 - 150 U/L 85 70 82  AST 5 - 34 U/L _1 ALT 0 - 55 U/L _2 PATHOLOGY REPORT:   ADDITIONAL INFORMATION: 04/25/16 PROGNOSTIC INDICATORS Results: IMMUNOHISTOCHEMICAL AND MORPHOMETRIC ANALYSIS PERFORMED MANUALLY Estrogen Receptor: 0%, NEGATIVE Progesterone Receptor: 0%, NEGATIVE Proliferation Marker Ki67: 30% COMMENT: The negative hormone receptor study(ies) in this case has no internal positive control. REFERENCE RANGE ESTROGEN RECEPTOR NEGATIVE 0% POSITIVE =>1% REFERENCE RANGE PROGESTERONE RECEPTOR NEGATIVE 0% POSITIVE =>1% All controls stained appropriately Enid Cutter MD Pathologist, Electronic Signature ( Signed 05/01/2016) FLUORESCENCE IN-SITU HYBRIDIZATION Results: HER2 - **POSITIVE** RATIO OF HER2/CEP17 SIGNALS 2.25 AVERAGE HER2 COPY NUMBER PER CELL 8.45 1 of 3 FINAL for Car, Ferne P ([id number]) ADDITIONAL INFORMATION:(continued) Reference Range: NEGATIVE HER2/CEP17 Ratio <2.0 and average HER2 copy number <4.0 EQUIVOCAL HER2/CEP17 Ratio <2.0 and average HER2 copy number 4.0 and <6.0 POSITIVE HER2/CEP17 Ratio >=2.0 or <2.0 and average HER2 copy number >=6.0 Enid Cutter MD Pathologist, Electronic Signature ( Signed 04/30/2016) FINAL  DIAGNOSIS Diagnosis Breast, left, needle core biopsy - INVASIVE DUCTAL CARCINOMA, SEE COMMENT. - DUCTAL CARCINOMA IN SITU. - LYMPHOVASCULAR INVASION PRESENT. Microscopic Comment The carcinoma appears grade 3 with focal squamous differentiation. Prognostic markers will be ordered. Dr. Lyndon Code has reviewed the case. The case was called to Dr. Isaiah Blakes on 04/26/2016. Vicente Males MD Pathologist, Electronic Signature (Case signed 04/26/2016)  RADIOGRAPHIC STUDIES: I have personally reviewed the radiological images as listed and agreed with the findings in the report.  See onc history  Echo 05/09/16 Study Conclusions -LF EF: 55-60% - Left ventricle: The cavity size was normal. There was mild focal   basal hypertrophy of the septum. Indeterminant diastolic   function. Systolic function was normal. The estimated ejection   fraction was in the range of 55% to 60%. Wall motion was normal;   there were no regional wall motion abnormalities. GLS abnormal at   -13.3%, poor images however.  ASSESSMENT & PLAN: 56 y.o. post-menopausal Caucasian female with a self palpated left breast mass.  1. Breast cancer of upper-outer quadrant of left breast, invasive ductal carcinoma,  stage IIA (cT2N0M0) grade 3, ER-, PR-, HER2 amplified -Patient imaging and pathology has been reviewed. -staging and biology of breast cancer has been reviewed  -discussed that surgical resection is the definitive treatment for breast cancer, she has been seen by breast surgeon Dr. Lucia Gaskins, lumpectomy versus mastectomy were discussed with patient. -HER2 positive breast cancers total approximately 15% of breast cancers and happens to be more aggressive than HER2 negative cancers, especially ER and PR negative disease, she has high likelihood of cancer recurrence after complete surgical resection.  - neoadjuvant chemotherapy TCHP (docetaxel, carboplatin, Herceptin and pejeta) every 3 weeks for 6 cycles, followed by maintenance  Herceptin and pejeta to complete 1 year therapy -Her baseline echo was normal, she was seen by cardiologist Dr. Benjamine Mola -MRI of the  bilateral breast 05/07/16 showed a lobulated enhancing mass (4.0 x 2.7 x 3.1 cm) in the upper-outer quadrant of the left breast corresponding with the recently diagnosed invasive mammary carcinoma. Linear enhancement extends 2.6 cm posterior to the mass worrisome for ductal carcinoma in-situ.    2. Genetics strong family history of breast cancer genetic counseling scheduled 05/17/16.  3. Type 2 Diabetes mellitus, HTN -Managed by her PCP. -The patient has peripheral neuropathy in her feet from her diabetes. The patient is already on Neurontin with 200 mg at night. We discussed that chemotherapy may make her neuropathy worse. -will do lower dose dex.   4. Colitis -has h/o colitis, may be exacerbated by chemotherapy.   -She is not taking steroids for this. She is on mesalamine, will follow up with Dr. Fuller Plan   5. Arthritis  -s/p b/l hip replacement, last surgery in February 2018.   PLAN Patient has significant nausea, vomiting, and diarrhea.  She was very unwell when she came to infusion room with her nausea vomiting and diarrhea, so she received one liter of iv fluids and IV zofran after her port was accessed and labs were drawn.  Due to her confusion and her sodium result of 125, she needs to go to ER for further IV fluid administration, evaluation and monitoring. I reviewed this with her husband and with her and they are agreeable.  We will see her after discharge.    This was reviewed with [name] in detail and she is in agreement with the above plan.  All questions were answered. The patient knows to call the clinic with any problems, questions or concerns.  A total of (30) minutes of face-to-face time was spent with this patient with greater than 50% of that time in counseling and care-coordination.      Scot Dock, NP 05/18/2016

## 2016-05-18 NOTE — ED Provider Notes (Signed)
[facility] DEPT Provider Note   CSN: [id number] Arrival date & time: 05/18/16  1444     History   Chief Complaint Chief Complaint  Patient presents with  . Abnormal Lab  . Abdominal Pain    n/v/d    HPI Brandi Dickson is a 56 y.o. female who presents with history of asthma, breast cancer status post 1 chemotherapy treatment 1 week ago, diabetes, hypertension. She was seen in the ED 5 days ago for SOB and CTA was negative for PE. Over the past 2 days she has had significant N/V/D and abdominal pain. She has also had blood in the stool. She has been taking antiemetics and anti-diarrheal agents without relief. Also has hx of UC and reports blood in the stool. Today she went to the cancer center and was evaluated. She received 1L NS and Zofran at Cancer clinic and was transferred to the ED for further care due to her clinical presentation. Labs were drawn and were significant for hyponatremia of 125. [name] is oncologist. She reports associated headache. She denies fever, chills, chest pain, SOB, dysuria, vaginal symptoms. Her husband reports mild confusion although the patient denies this. When asked where she is she states she is still at the cancer center.  HPI  Past Medical History:  Diagnosis Date  . Arthritis   . Asthma    triggered with [name] perfumes and cigarette smoke  . Cancer ([place])   . Colitis   . Diabetes mellitus without complication ([place])   . Hypertension   . Neuropathy     Patient Active Problem List   Diagnosis Date Noted  . Port catheter in place 05/11/2016  . Breast cancer of upper-outer quadrant of left female breast ([place]) 05/02/2016  . S/P left THA, AA 03/06/2016  . Rectal bleeding 07/14/2015  . LLQ abdominal pain 07/14/2015  . Special screening for malignant neoplasms, colon 07/14/2015  . Loss of weight 07/14/2015  . Family history of colon cancer 07/14/2015    Past Surgical History:  Procedure Laterality Date  . ABDOMINAL HYSTERECTOMY     . DILATION AND CURETTAGE OF UTERUS    . PORTACATH PLACEMENT Right 05/08/2016   Procedure: INSERTION PORT-A-CATH WITH [place];  Surgeon: [name], MD;  Location: [facility];  Service: General;  Laterality: Right;  . TONSILLECTOMY    . TOTAL HIP ARTHROPLASTY Right   . TOTAL HIP ARTHROPLASTY Left 03/06/2016   Procedure: LEFT TOTAL HIP ARTHROPLASTY ANTERIOR APPROACH;  Surgeon: [name], MD;  Location: [facility];  Service: Orthopedics;  Laterality: Left;    OB History    No data available       Home Medications    Prior to Admission medications   Medication Sig Start Date End Date Taking? Authorizing Provider  albuterol (PROVENTIL HFA;VENTOLIN HFA) 108 (90 Base) MCG/ACT inhaler Inhale 1-2 puffs into the lungs every 6 (six) hours as needed for wheezing or shortness of breath.    Historical Provider, MD  ASPIRIN 81 PO Take by mouth.    Historical Provider, MD  dexamethasone (DECADRON) 4 MG tablet Take 1 tablet (4 mg total) by mouth daily. Start the day before Taxotere. Then again the day after chemo for 3 days. 05/02/16   Truitt Merle, MD  diphenoxylate-atropine (LOMOTIL) 2.5-0.025 MG tablet Take one to two tablets every six hours as needed for diarrhea 05/16/16   Truitt Merle, MD  ferrous sulfate (FERROUSUL) 325 (65 FE) MG tablet Take 1 tablet (325 mg total) by mouth  3 (three) times daily with meals. 03/06/16   Danae Orleans, PA-C  gabapentin (NEURONTIN) 100 MG capsule Take 200 mg by mouth at bedtime.     Historical Provider, MD  lidocaine-prilocaine (EMLA) cream Apply 1 application to skin 1.5 to 2 hrs before use.  Cover to secure cream with plastic wrap. 05/10/16   Truitt Merle, MD  loperamide (IMODIUM) 1 MG/5ML solution Take 3 mg by mouth as needed for diarrhea or loose stools.    Historical Provider, MD  LORazepam (ATIVAN) 0.5 MG tablet Take 1 tablet (0.5 mg total) by mouth once as needed for anxiety. 05/02/16   Truitt Merle, MD  Mesalamine (ASACOL HD) 800 MG TBEC Take 1 tablet (800 mg total) by  mouth 3 (three) times daily. 04/30/16   Ladene Artist, MD  metFORMIN (GLUCOPHAGE-XR) 500 MG 24 hr tablet Take 1,000 mg by mouth at bedtime.    Historical Provider, MD  metoprolol (LOPRESSOR) 50 MG tablet Take 50 mg by mouth 2 (two) times daily.    Historical Provider, MD  niacin 250 MG tablet Take 250 mg by mouth daily before breakfast.    Historical Provider, MD  ondansetron (ZOFRAN) 8 MG tablet Take 1 tablet (8 mg total) by mouth 2 (two) times daily as needed for refractory nausea / vomiting. Start on day 3 after chemo. 05/02/16   Truitt Merle, MD  prochlorperazine (COMPAZINE) 10 MG tablet Take 1 tablet (10 mg total) by mouth every 6 (six) hours as needed (Nausea or vomiting). 05/02/16   Truitt Merle, MD  Specialty Vitamins Products (VITAMINS FOR THE HAIR) TABS Take 2 tablets by mouth 2 (two) times daily.    Historical Provider, MD  traMADol (ULTRAM) 50 MG tablet Take 1-2 tablets (50-100 mg total) by mouth every 6 (six) hours as needed. 03/06/16   Danae Orleans, PA-C  triamterene-hydrochlorothiazide (MAXZIDE) 75-50 MG per tablet Take 0.5 tablets by mouth daily.     Historical Provider, MD    Family History Family History  Problem Relation Age of Onset  . Colon cancer Father   . Stomach cancer Paternal Uncle   . Stomach cancer Paternal Uncle   . Melanoma Brother   . Thyroid cancer Brother   . Breast cancer Maternal Aunt   . Breast cancer Maternal Aunt   . Breast cancer Cousin     Social History Social History  Substance Use Topics  . Smoking status: Former Smoker    Packs/day: 1.00    Years: 29.00    Types: Cigarettes    Quit date: 10/27/2002  . Smokeless tobacco: Never Used  . Alcohol use No     Allergies   Lisinopril; Augmentin [amoxicillin-pot clavulanate]; and Benadryl [diphenhydramine]   Review of Systems Review of Systems  Constitutional: Positive for fatigue. Negative for chills and fever.  Respiratory: Negative for shortness of breath.   Cardiovascular: Negative for chest  pain.  Gastrointestinal: Positive for abdominal pain, blood in stool, diarrhea, nausea and vomiting. Negative for anal bleeding.  Allergic/Immunologic: Positive for immunocompromised state.  Neurological: Positive for light-headedness and headaches. Negative for syncope.  Psychiatric/Behavioral: Positive for confusion.  All other systems reviewed and are negative.    Physical Exam Updated Vital Signs BP (!) 144/91 (BP Location: Right Arm)   Pulse (!) 127   Temp 97.7 F (36.5 C) (Oral)   Resp 18   Ht 5\' 4"  (1.626 m)   Wt 96.6 kg   SpO2 99%   BMI 36.56 kg/m   Physical Exam  Constitutional: She  appears well-developed and well-nourished. She appears ill. No distress.  NAD. Appears fatigued  HENT:  Head: Normocephalic and atraumatic.  Eyes: Conjunctivae are normal. Pupils are equal, round, and reactive to light. Right eye exhibits no discharge. Left eye exhibits no discharge. No scleral icterus.  Neck: Normal range of motion.  Cardiovascular: Tachycardia present.  Exam reveals no gallop and no friction rub.   No murmur heard. Pulmonary/Chest: Effort normal and breath sounds normal. No respiratory distress. She has no wheezes. She has no rales. She exhibits no tenderness.  Abdominal: Soft. Bowel sounds are normal. She exhibits no distension and no mass. There is tenderness (generalized). There is no rebound and no guarding. No hernia.  Neurological: She is alert. GCS eye subscore is 4. GCS verbal subscore is 5. GCS motor subscore is 6.  Skin: Skin is warm and dry.  Psychiatric: She has a normal mood and affect. Her behavior is normal.  Nursing note and vitals reviewed.    ED Treatments / Results  Labs (all labs ordered are listed, but only abnormal results are displayed) Labs Reviewed  CBC - Abnormal; Notable for the following:       Result Value   WBC 2.5 (*)    Hemoglobin 11.1 (*)    HCT 32.4 (*)    Platelets 36 (*)    All other components within normal limits  BASIC  METABOLIC PANEL - Abnormal; Notable for the following:    Sodium 125 (*)    Potassium 3.2 (*)    Chloride 92 (*)    Glucose, Bld 126 (*)    Calcium 7.9 (*)    All other components within normal limits  MAGNESIUM - Abnormal; Notable for the following:    Magnesium 1.4 (*)    All other components within normal limits  DIFFERENTIAL - Abnormal; Notable for the following:    Neutro Abs 1.2 (*)    All other components within normal limits  PHOSPHORUS  SODIUM, URINE, RANDOM  CREATININE, URINE, RANDOM  OSMOLALITY, URINE  I-STAT CG4 LACTIC ACID, ED  TYPE AND SCREEN    EKG  EKG Interpretation None       Radiology No results found.  Procedures Procedures (including critical care time)  Medications Ordered in ED Medications  0.9 %  sodium chloride infusion ( Intravenous New Bag/Given 05/18/16 1717)  morphine 4 MG/ML injection 4 mg (4 mg Intravenous Given 05/18/16 1718)  famotidine (PEPCID) IVPB 20 mg premix (0 mg Intravenous Stopped 05/18/16 1736)  magnesium sulfate IVPB 2 g 50 mL (2 g Intravenous New Bag/Given 05/18/16 1922)  sodium chloride 0.9 % bolus 1,000 mL (0 mLs Intravenous Stopped 05/18/16 1922)     Initial Impression / Assessment and Plan / ED Course  I have reviewed the triage vital signs and the nursing notes.  Pertinent labs & imaging results that were available during my care of the patient were reviewed by me and considered in my medical decision making (see chart for details).  56 year old female presents with symptomatic hyponatremia likely due to N/V/D. She is tachycardic on arrival. Otherwise vitals are normal. CBC remarkable for leukopenia (2.4) and platelets 36. CMP remarkable for hyponatremia of 125, hypochloremia 88, SCr of 1.2 which is slightly elevated from baseline. Lactic acid is normal. Mg is slightly low at 1.4. Fluids, Mg, Pepcid, and pain medicine ordered. Shared visit with Dr. Myrene Buddy. Will admit to hospitalist. Spoke with Dr. Loletha Carrow with [facility] who  will follow. He recommends stool panel due to  immunocompromised state.  Final Clinical Impressions(s) / ED Diagnoses   Final diagnoses:  Hyponatremia  Nausea vomiting and diarrhea    New Prescriptions New Prescriptions   No medications on file        Recardo Evangelist, PA-C 05/18/16 2054    Recardo Evangelist, PA-C 05/18/16 2218    Duffy Bruce, MD 05/19/16 1158

## 2016-05-19 ENCOUNTER — Other Ambulatory Visit: Payer: Self-pay | Admitting: Oncology

## 2016-05-19 DIAGNOSIS — R1084 Generalized abdominal pain: Secondary | ICD-10-CM

## 2016-05-19 DIAGNOSIS — K921 Melena: Secondary | ICD-10-CM

## 2016-05-19 DIAGNOSIS — R112 Nausea with vomiting, unspecified: Secondary | ICD-10-CM

## 2016-05-19 DIAGNOSIS — R197 Diarrhea, unspecified: Secondary | ICD-10-CM

## 2016-05-19 LAB — BASIC METABOLIC PANEL
Anion gap: 9 (ref 5–15)
BUN: 19 mg/dL (ref 6–20)
CO2: 23 mmol/L (ref 22–32)
CREATININE: 0.99 mg/dL (ref 0.44–1.00)
Calcium: 8.2 mg/dL — ABNORMAL LOW (ref 8.9–10.3)
Chloride: 98 mmol/L — ABNORMAL LOW (ref 101–111)
Glucose, Bld: 102 mg/dL — ABNORMAL HIGH (ref 65–99)
Potassium: 4 mmol/L (ref 3.5–5.1)
SODIUM: 130 mmol/L — AB (ref 135–145)

## 2016-05-19 LAB — C DIFFICILE QUICK SCREEN W PCR REFLEX
C DIFFICILE (CDIFF) TOXIN: NEGATIVE
C Diff antigen: NEGATIVE
C Diff interpretation: NOT DETECTED

## 2016-05-19 LAB — GLUCOSE, CAPILLARY
GLUCOSE-CAPILLARY: 98 mg/dL (ref 65–99)
GLUCOSE-CAPILLARY: 125 mg/dL — AB (ref 65–99)
Glucose-Capillary: 96 mg/dL (ref 65–99)
Glucose-Capillary: 105 mg/dL — ABNORMAL HIGH (ref 65–99)
Glucose-Capillary: 148 mg/dL — ABNORMAL HIGH (ref 65–99)

## 2016-05-19 LAB — OSMOLALITY, URINE: OSMOLALITY UR: 436 mosm/kg (ref 300–900)

## 2016-05-19 LAB — CBC WITH DIFFERENTIAL/PLATELET
BASOS ABS: 0 10*3/uL (ref 0.0–0.1)
Basophils Relative: 0 %
EOS ABS: 0 10*3/uL (ref 0.0–0.7)
Eosinophils Relative: 0 %
HCT: 32.3 % — ABNORMAL LOW (ref 36.0–46.0)
HEMOGLOBIN: 11 g/dL — AB (ref 12.0–15.0)
LYMPHS PCT: 32 %
Lymphs Abs: 1.6 10*3/uL (ref 0.7–4.0)
MCH: 28.2 pg (ref 26.0–34.0)
MCHC: 34.1 g/dL (ref 30.0–36.0)
MCV: 82.8 fL (ref 78.0–100.0)
MONOS PCT: 10 %
Monocytes Absolute: 0.5 10*3/uL (ref 0.1–1.0)
NEUTROS ABS: 2.8 10*3/uL (ref 1.7–7.7)
Neutrophils Relative %: 58 %
PLATELETS: 52 10*3/uL — AB (ref 150–400)
RBC: 3.9 MIL/uL (ref 3.87–5.11)
RDW: 13.2 % (ref 11.5–15.5)
WBC Morphology: INCREASED
WBC: 4.9 10*3/uL (ref 4.0–10.5)

## 2016-05-19 LAB — COMPREHENSIVE METABOLIC PANEL
ALBUMIN: 2.4 g/dL — AB (ref 3.5–5.0)
ALT: 26 U/L (ref 14–54)
ANION GAP: 9 (ref 5–15)
AST: 35 U/L (ref 15–41)
Alkaline Phosphatase: 72 U/L (ref 38–126)
BILIRUBIN TOTAL: 0.8 mg/dL (ref 0.3–1.2)
BUN: 19 mg/dL (ref 6–20)
CHLORIDE: 98 mmol/L — AB (ref 101–111)
CO2: 23 mmol/L (ref 22–32)
Calcium: 8.2 mg/dL — ABNORMAL LOW (ref 8.9–10.3)
Creatinine, Ser: 1 mg/dL (ref 0.44–1.00)
Glucose, Bld: 101 mg/dL — ABNORMAL HIGH (ref 65–99)
POTASSIUM: 4 mmol/L (ref 3.5–5.1)
SODIUM: 130 mmol/L — AB (ref 135–145)
TOTAL PROTEIN: 5.6 g/dL — AB (ref 6.5–8.1)

## 2016-05-19 LAB — GASTROINTESTINAL PANEL BY PCR, STOOL (REPLACES STOOL CULTURE)

## 2016-05-19 LAB — TSH: TSH: 1.491 u[IU]/mL (ref 0.350–4.500)

## 2016-05-19 LAB — MAGNESIUM: Magnesium: 2.4 mg/dL (ref 1.7–2.4)

## 2016-05-19 LAB — MRSA PCR SCREENING: MRSA by PCR: NEGATIVE

## 2016-05-19 LAB — PHOSPHORUS: Phosphorus: 2.6 mg/dL (ref 2.5–4.6)

## 2016-05-19 LAB — SODIUM, URINE, RANDOM: Sodium, Ur: <10 mmol/L

## 2016-05-19 LAB — CBC
HCT: 30.1 % — ABNORMAL LOW (ref 36.0–46.0)
HEMOGLOBIN: 10.4 g/dL — AB (ref 12.0–15.0)
MCH: 28.9 pg (ref 26.0–34.0)
MCHC: 34.6 g/dL (ref 30.0–36.0)
MCV: 83.6 fL (ref 78.0–100.0)
PLATELETS: 53 10*3/uL — AB (ref 150–400)
RBC: 3.6 MIL/uL — AB (ref 3.87–5.11)
RDW: 13.3 % (ref 11.5–15.5)
WBC: 6.7 10*3/uL (ref 4.0–10.5)

## 2016-05-19 LAB — CREATININE, URINE, RANDOM: CREATININE, URINE: 100.17 mg/dL

## 2016-05-19 LAB — HIV ANTIBODY (ROUTINE TESTING W REFLEX): HIV SCREEN 4TH GENERATION: NONREACTIVE

## 2016-05-19 MED ORDER — ACETAMINOPHEN 650 MG RE SUPP: 650.0000 mg | Freq: Four times a day (QID) | RECTAL | Status: DC | PRN

## 2016-05-19 MED ORDER — MORPHINE SULFATE (PF) 4 MG/ML IV SOLN
2.0000 mg | INTRAVENOUS | Status: DC | PRN
  Administered 2016-05-19 – 2016-05-23 (×10): 2 mg via INTRAVENOUS
  Filled 2016-05-19 (×10): qty 1

## 2016-05-19 MED ORDER — SODIUM CHLORIDE 0.9 % IV SOLN
INTRAVENOUS | Status: DC
  Administered 2016-05-19: via INTRAVENOUS
  Administered 2016-05-20: 1000 mL via INTRAVENOUS

## 2016-05-19 MED ORDER — PANTOPRAZOLE SODIUM 40 MG IV SOLR
40.0000 mg | Freq: Two times a day (BID) | INTRAVENOUS | Status: DC
  Administered 2016-05-19 – 2016-05-24 (×12): 40 mg via INTRAVENOUS
  Filled 2016-05-19 (×12): qty 40

## 2016-05-19 MED ORDER — BOOST / RESOURCE BREEZE PO LIQD
1.0000 | Freq: Three times a day (TID) | ORAL | Status: DC
  Administered 2016-05-20 – 2016-05-23 (×2): 1 via ORAL

## 2016-05-19 MED ORDER — MESALAMINE 400 MG PO CPDR
800.0000 mg | DELAYED_RELEASE_CAPSULE | Freq: Three times a day (TID) | ORAL | Status: DC
  Administered 2016-05-19 – 2016-05-21 (×7): 800 mg via ORAL
  Filled 2016-05-19 (×8): qty 2

## 2016-05-19 MED ORDER — SODIUM CHLORIDE 0.9 % IV SOLN: 30.0000 meq | Freq: Once | INTRAVENOUS | Status: DC

## 2016-05-19 MED ORDER — POTASSIUM CHLORIDE 10 MEQ/100ML IV SOLN
10.0000 meq | INTRAVENOUS | Status: AC
  Administered 2016-05-19 (×3): 10 meq via INTRAVENOUS
  Filled 2016-05-19 (×3): qty 100

## 2016-05-19 MED ORDER — SODIUM CHLORIDE 0.9% FLUSH
3.0000 mL | Freq: Two times a day (BID) | INTRAVENOUS | Status: DC
  Administered 2016-05-19 – 2016-05-21 (×4): 3 mL via INTRAVENOUS

## 2016-05-19 MED ORDER — DICYCLOMINE HCL 20 MG PO TABS
20.0000 mg | ORAL_TABLET | Freq: Three times a day (TID) | ORAL | Status: DC
  Administered 2016-05-19 – 2016-05-24 (×19): 20 mg via ORAL
  Filled 2016-05-19 (×22): qty 1

## 2016-05-19 MED ORDER — ACETAMINOPHEN 325 MG PO TABS: 650.0000 mg | ORAL_TABLET | Freq: Four times a day (QID) | ORAL | Status: DC | PRN

## 2016-05-19 MED ORDER — LORAZEPAM 0.5 MG PO TABS
0.5000 mg | ORAL_TABLET | Freq: Once | ORAL | Status: DC | PRN
  Administered 2016-05-19 – 2016-05-23 (×5): 0.5 mg via ORAL
  Filled 2016-05-19 (×5): qty 1

## 2016-05-19 MED ORDER — ONDANSETRON HCL 4 MG/2ML IJ SOLN: 4.0000 mg | Freq: Four times a day (QID) | INTRAMUSCULAR | Status: DC | PRN

## 2016-05-19 MED ORDER — ONDANSETRON HCL 4 MG PO TABS: 4.0000 mg | ORAL_TABLET | Freq: Four times a day (QID) | ORAL | Status: DC | PRN

## 2016-05-19 MED ORDER — GABAPENTIN 100 MG PO CAPS
200.0000 mg | ORAL_CAPSULE | Freq: Every day | ORAL | Status: DC
  Administered 2016-05-19 – 2016-05-23 (×6): 200 mg via ORAL
  Filled 2016-05-19 (×6): qty 2

## 2016-05-19 MED ORDER — SODIUM CHLORIDE 0.9 % IV SOLN
INTRAVENOUS | Status: AC
  Administered 2016-05-19: 01:00:00 via INTRAVENOUS

## 2016-05-19 MED ORDER — HYDROCODONE-ACETAMINOPHEN 5-325 MG PO TABS: 1.0000 | ORAL_TABLET | ORAL | Status: DC | PRN

## 2016-05-19 NOTE — Progress Notes (Signed)
The is having frequent trips to the bathroom, and has requested that we have three side rails up and a bsc at the bedside. I have informed the Pt about the importance of calling for help for bathroom assistance bed alarm is on at this time. I will continue to monitor.

## 2016-05-19 NOTE — Progress Notes (Signed)
Initial Nutrition Assessment  DOCUMENTATION CODES:   Obesity unspecified  INTERVENTION:   Provide Boost Breeze po TID, each supplement provides 250 kcal and 9 grams of protein while on clear liquids.  RD to continue to monitor for needs  NUTRITION DIAGNOSIS:   Increased nutrient needs related to cancer and cancer related treatments as evidenced by estimated needs.  GOAL:   Patient will meet greater than or equal to 90% of their needs  MONITOR:   PO intake, Supplement acceptance, Labs, Weight trends, Diet advancement, I & O's  REASON FOR ASSESSMENT:   Malnutrition Screening Tool    ASSESSMENT:   56 y.o. female with medical history significant of 68 diagnosed breast cancer, DM 2 asthma, HTN, neuropathy secondary to diabetes, ulcerative colitis  Pt last seen by [facility] on 4/4, at that time pt was reporting normal appetite and eating well.  Pt now reports a week of N/V/D. Prior to these symptoms she was eating normally. Ate at a potluck and became sick after eating on 4/14. Pt on clear liquids at this time. Will order Boost Breeze supplements while on clears.  Per chart review, pt has lost 9 lb since 2/6 (4% wt loss x 2.5 months, insignificant for time frame). Nutrition focused physical exam shows no sign of depletion of muscle mass or body fat.  Medications: IV Protonix every 12 hours Labs reviewed: Low Na Mg/Phos WNL  Diet Order:  Diet clear liquid Room service appropriate? Yes; Fluid consistency: Thin  Skin:  Wound (see comment) (4/10 R breast incision)  Last BM:  4/21  Height:   Ht Readings from Last 1 Encounters:  05/18/16 5\' 4"  (1.626 m)    Weight:   Wt Readings from Last 1 Encounters:  05/18/16 213 lb (96.6 kg)    Ideal Body Weight:  54.5 kg  BMI:  Body mass index is 36.56 kg/m.  Estimated Nutritional Needs:   Kcal:  1950-2150  Protein:  80-90g  Fluid:  2L/day  EDUCATION NEEDS:   No education needs identified at this  time  Clayton Bibles, MS, RD, LDN Pager: [phone] After Hours Pager: [phone]

## 2016-05-19 NOTE — Progress Notes (Unsigned)
I was called by the hospitalist on this patient because of concerns regarding the low platelet count and ulcerative colitis with some bleeding. Hemoglobin is stable. I suggested checking platelets twice daily for now and maintaining a count >50K so long as there is a concern re bleeding from her UC. She is now day 9 from her chemo and her marrow function will be recovering in the next few days.

## 2016-05-19 NOTE — Progress Notes (Signed)
PROGRESS NOTE    Brandi Dickson  LYY:[id number] DOB: Mar 05, 1960 DOA: 05/18/2016 PCP: Orpah Melter, MD   Chief Complaint  Patient presents with  . Abnormal Lab  . Abdominal Pain    n/v/d    Brief Narrative:  56 y.o. female with medical history significant of 68 diagnosed breast cancer, DM 2 asthma, HTN, neuropathy secondary to diabetes, ulcerative colitis. Presented with vomiting and diarrhea. Found to have rectal bleeding and thrombocytopenia.   Assessment & Plan   GI bleeding/hematochezia with diarrhea secondary to exacerbation of ulcerative colitis -In the setting of thrombocytopenia with recent chemotherapy -C Diff PCR negative, GI pathogen panel pending -hemoglobin currently stable -Gastroenterology consulted and appreciated -Continue mesalamine -Suspect diarrhea may be due to food poisoning at it started earlier this week after eating out  Thrombocytopenia -Likely secondary to chemotherapy received on 05/11/2016 -Platelets dropped to 36, patient was transfused -Currently platelets 52 -Discussed with Oncology/Hematology, Dr. Jana Hakim, keep platelets over 50K given GIB -Continue to monitor CBC   Hyponatremia -Likely secondary to GI losses and dehydration -Sodium was 125 upon admission, currently 130 -Continue IVF and monitor BMP  Hypomagnesemia/Hypokalemia  -Replaced/resolved  Diabetes mellitus, type II with neuropathy -Hold metformin -Placed on ISS and CBG monitoring -Continue gabapentin for neuropathy  Breast cancer of the upper-outer quadrant of the left breast -Stage IIA -Followed by [name] -Recently received chemotherapy, Docetaxel, carboplatin, herceptin and pejeta  -Started on 05/11/16 -Discussed with HemOnc  DVT Prophylaxis  SCDs  Code Status: Full  Family Communication: None at bedside  Disposition Plan: Admitted, continue treatment and monitor CBC. Pending GI  recommendations  Consultants Gastroenterology Hematology/Oncology  Procedures  None  Antibiotics   Anti-infectives    None      Subjective:   Brandi Dickson seen and examined today.  Patient states she is feeling mildly better. Denies further nausea and vomiting. Would like to try drinking clear liquids. Denies chest pain, shortness of breath, headache, dizziness.  Objective:   Vitals:   05/19/16 0000 05/19/16 0121 05/19/16 0349 05/19/16 0800  BP: (!) 118/57 (!) 112/51    Pulse: (!) 106 (!) 103    Resp: 18 14    Temp:  97.9 F (36.6 C) 97.6 F (36.4 C) 97.6 F (36.4 C)  TempSrc:  Oral Oral Oral  SpO2: 98% 96%    Weight:      Height:        Intake/Output Summary (Last 24 hours) at 05/19/16 1104 Last data filed at 05/19/16 0130  Gross per 24 hour  Intake             1423 ml  Output              200 ml  Net             1223 ml   Filed Weights   05/18/16 1446  Weight: 96.6 kg (213 lb)    Exam  General: Well developed, well nourished, NAD, appears stated age  HEENT: NCAT, mucous membranes moist.   Cardiovascular: S1 S2 auscultated, no rubs, murmurs or gallops. Regular rate and rhythm.  Respiratory: Clear to auscultation bilaterally with equal chest rise  Abdomen: Soft, obese, mild diffuse TTP, nondistended, + bowel sounds  Extremities: warm dry without cyanosis clubbing or edema  Neuro: AAOx3, nonfocal  Psych: Normal affect and demeanor with intact judgement and insight   Data Reviewed: I have personally reviewed following labs and imaging studies  CBC:  Recent Labs Lab 05/13/16 1419 05/18/16 1238 05/18/16  1653 05/19/16 0740  WBC 6.3 2.4* 2.5* 4.9  NEUTROABS  --  1.4* 1.2* 2.8  HGB 13.6 12.7 11.1* 11.0*  HCT 40.8 38.3 32.4* 32.3*  MCV 84.8 85.3 80.8 82.8  PLT 260 54* 36* 52*   Basic Metabolic Panel:  Recent Labs Lab 05/13/16 1419 05/18/16 1238 05/18/16 1653 05/19/16 0740  NA 136 125 Repeated and Verified* 125* 130*  130*  K 4.2  3.8 3.2* 4.0  4.0  CL 103  --  92* 98*  98*  CO2 24 23 24 23  23   GLUCOSE 123* 131 126* 101*  102*  BUN 17 21.0 19 19  19   CREATININE 0.78 1.2* 1.00 1.00  0.99  CALCIUM 9.1 9.2 7.9* 8.2*  8.2*  MG  --   --  1.4* 2.4  PHOS  --   --  2.8 2.6   GFR: Estimated Creatinine Clearance: 70.1 mL/min (by C-G formula based on SCr of 1 mg/dL). Liver Function Tests:  Recent Labs Lab 05/13/16 1419 05/18/16 1238 05/19/16 0740  AST 26 23 35  ALT 26 19 26   ALKPHOS 70 85 72  BILITOT 0.8 1.21* 0.8  PROT 7.9 6.7 5.6*  ALBUMIN 3.8 2.7* 2.4*   No results for input(s): LIPASE, AMYLASE in the last 168 hours. No results for input(s): AMMONIA in the last 168 hours. Coagulation Profile: No results for input(s): INR, PROTIME in the last 168 hours. Cardiac Enzymes:  Recent Labs Lab 05/13/16 1431  TROPONINI <0.03   BNP (last 3 results) No results for input(s): PROBNP in the last 8760 hours. HbA1C: No results for input(s): HGBA1C in the last 72 hours. CBG:  Recent Labs Lab 05/18/16 2306 05/19/16 0422 05/19/16 0809  GLUCAP 115* 98 96   Lipid Profile: No results for input(s): CHOL, HDL, LDLCALC, TRIG, CHOLHDL, LDLDIRECT in the last 72 hours. Thyroid Function Tests:  Recent Labs  05/19/16 0740  TSH 1.491   Anemia Panel: No results for input(s): VITAMINB12, FOLATE, FERRITIN, TIBC, IRON, RETICCTPCT in the last 72 hours. Urine analysis:    Component Value Date/Time   COLORURINE YELLOW 01/18/2015 [place] 01/18/2015 1209   LABSPEC 1.014 01/18/2015 1209   PHURINE 6.5 01/18/2015 1209   GLUCOSEU NEGATIVE 01/18/2015 1209   HGBUR NEGATIVE 01/18/2015 1209   BILIRUBINUR NEGATIVE 01/18/2015 1209   KETONESUR NEGATIVE 01/18/2015 1209   PROTEINUR NEGATIVE 01/18/2015 1209   UROBILINOGEN 0.2 12/30/2008 1317   NITRITE NEGATIVE 01/18/2015 1209   LEUKOCYTESUR NEGATIVE 01/18/2015 1209   Sepsis Labs: @LABRCNTIP (procalcitonin:4,lacticidven:4)  ) Recent Results (from the  past 240 hour(s))  MRSA PCR Screening     Status: None   Collection Time: 05/18/16 10:50 PM  Result Value Ref Range Status   MRSA by PCR NEGATIVE NEGATIVE Final    Comment:        The GeneXpert MRSA Assay (FDA approved for NASAL specimens only), is one component of a comprehensive MRSA colonization surveillance program. It is not intended to diagnose MRSA infection nor to guide or monitor treatment for MRSA infections.   C difficile quick scan w PCR reflex     Status: None   Collection Time: 05/19/16 12:15 AM  Result Value Ref Range Status   C Diff antigen NEGATIVE NEGATIVE Final   C Diff toxin NEGATIVE NEGATIVE Final   C Diff interpretation No C. difficile detected.  Final      Radiology Studies: No results found.   Scheduled Meds: . gabapentin  200 mg Oral QHS  .  insulin aspart  0-9 Units Subcutaneous Q4H  . Mesalamine  800 mg Oral TID  . pantoprazole (PROTONIX) IV  40 mg Intravenous Q12H  . sodium chloride flush  3 mL Intravenous Q12H   Continuous Infusions:   LOS: 1 day   Time Spent in minutes   30 minutes  Kailand Seda D.O. on 05/19/2016 at 11:04 AM  Between 7am to 7pm - Pager - [phone]  After 7pm go to [email or web address] - password [email or web address]  And look for the night coverage person covering for me after hours  [facility] Office  [phone]

## 2016-05-19 NOTE — Consult Note (Signed)
Consultation  Referring Provider: Dr. Ree Kida Primary Care Physician:  Orpah Melter, MD Primary Gastroenterologist: Dr. Fuller Plan        Reason for Consultation: Ulcerative Colitis, Hematochezia, Diarrhea, Nausea and Vomiting           HPI:   Brandi Dickson is a 56 y.o. Caucasian female with past medical history of asthma, arthritis, recently diagnosed breast cancer, Ulcerative colitis, DM and HTN, who presented to the ED with complaint of 5 days of nausea, vomiting and diarrhea with bright red blood.  Today, the patient is found laying in bed comfortably on the phone with a relative. She begins by explaining that she was recently diagnosed with breast cancer and had her first chemotherapy therapy treatment on Friday 05/11/16 and seemed to do well that day into the next. On Saturday, 05/12/16 her and her husband went to a potluck birthday dinner and had some "questionable food", they both started with diarrhea that night, though her husband's stopped on Monday and she continued with symptoms and also started with some bright red blood mixed in with diarrhea on Monday, 05/14/16. She also started with nausea and vomiting. She was able to go to work on Monday but by Tuesday felt so ill that she could not. She continued with symptoms and progressively heavier bright red blood over the past week. She did try Imodium which did not help and was then instructed to try Lomotil which seemed to "lessen things", but not stop them. The patient has not had another episode of vomiting since she has been here but has continued with loose stool. Associated symptoms include a generalized abdominal pain. Patient feels like she has "a rock in my gut".   Patient does tell me that she was just recently diagnosed with ulcerative colitis last year and typically only takes her Asacol HD 800 mg as needed, though admits that for the past couple of months she has been taking this 2-3 times per day. She seems to have certain  foods which "set off my symptoms" and tries to avoid these.   Patient also has chronic reflux symptoms for which she was on Nexium at some point, but also stopped this due to "fear of side effects".   Patient's social history is positive for losing around 36 pounds over the past year" watching my diet".   Patient denies fever, chills, melena or change in appetite.   IN ER:  Temp (24hrs), Avg:97.8 F (36.6 C), Min:97.7 F (36.5 C), Max:97.9 F (36.6 C)     RR19 96 %Hr 116 Bp 115/71 LA 1.72 WBC 2.5  Hg 11.1 at baseline 13.6 PLT 36 down from 54 today and down from 260 at baseline NA 125 K 3.2 Cr 1.00 Mg 1.4  Past GI History: 08/22/15-colonoscopy, Dr. Fuller Plan: Impression: 16 mm polyp in the cecum, diffuse moderate inflammation in the rectum, sigmoid colon, descending colon and at the splenic flexure secondary to ulcerative colitis, diffuse mild inflammation in the transverse colon and at the hepatic flexure secondary to ulcerative colitis.; Recommendations: Patient started on Canasa call HD 800 mg 2 tabs by mouth 3 times a day  Past Medical History:  Diagnosis Date  . Arthritis   . Asthma    triggered with [name] perfumes and cigarette smoke  . Cancer ([place])   . Colitis   . Diabetes mellitus without complication ([place])   . Hypertension   . Neuropathy     Past Surgical History:  Procedure Laterality Date  .  ABDOMINAL HYSTERECTOMY    . DILATION AND CURETTAGE OF UTERUS    . PORTACATH PLACEMENT Right 05/08/2016   Procedure: INSERTION PORT-A-CATH WITH [place];  Surgeon: [name], MD;  Location: [facility];  Service: General;  Laterality: Right;  . TONSILLECTOMY    . TOTAL HIP ARTHROPLASTY Right   . TOTAL HIP ARTHROPLASTY Left 03/06/2016   Procedure: LEFT TOTAL HIP ARTHROPLASTY ANTERIOR APPROACH;  Surgeon: [name], MD;  Location: [facility];  Service: Orthopedics;  Laterality: Left;    Family History  Problem Relation Age of Onset  . Colon cancer Father   . Stomach  cancer Paternal Uncle   . Stomach cancer Paternal Uncle   . Melanoma Brother   . Thyroid cancer Brother   . Breast cancer Maternal Aunt   . Breast cancer Maternal Aunt   . Breast cancer Cousin     Social History  Substance Use Topics  . Smoking status: Former Smoker    Packs/day: 1.00    Years: 29.00    Types: Cigarettes    Quit date: 10/27/2002  . Smokeless tobacco: Never Used  . Alcohol use No  Her husband is at the bedside  Prior to Admission medications   Medication Sig Start Date End Date Taking? Authorizing Provider  albuterol (PROVENTIL HFA;VENTOLIN HFA) 108 (90 Base) MCG/ACT inhaler Inhale 1-2 puffs into the lungs every 6 (six) hours as needed for wheezing or shortness of breath.   Yes Historical Provider, MD  ASPIRIN 81 PO Take by mouth.   Yes Historical Provider, MD  dexamethasone (DECADRON) 4 MG tablet Take 1 tablet (4 mg total) by mouth daily. Start the day before Taxotere. Then again the day after chemo for 3 days. 05/02/16  Yes Truitt Merle, MD  diphenoxylate-atropine (LOMOTIL) 2.5-0.025 MG tablet Take one to two tablets every six hours as needed for diarrhea 05/16/16  Yes Truitt Merle, MD  ferrous sulfate (FERROUSUL) 325 (65 FE) MG tablet Take 1 tablet (325 mg total) by mouth 3 (three) times daily with meals. 03/06/16  Yes Danae Orleans, PA-C  gabapentin (NEURONTIN) 100 MG capsule Take 200 mg by mouth at bedtime.    Yes Historical Provider, MD  lidocaine-prilocaine (EMLA) cream Apply 1 application to skin 1.5 to 2 hrs before use.  Cover to secure cream with plastic wrap. 05/10/16  Yes Truitt Merle, MD  loperamide (IMODIUM) 1 MG/5ML solution Take 3 mg by mouth as needed for diarrhea or loose stools.   Yes Historical Provider, MD  LORazepam (ATIVAN) 0.5 MG tablet Take 1 tablet (0.5 mg total) by mouth once as needed for anxiety. 05/02/16  Yes Truitt Merle, MD  Mesalamine (ASACOL HD) 800 MG TBEC Take 1 tablet (800 mg total) by mouth 3 (three) times daily. 04/30/16  Yes Ladene Artist, MD  metFORMIN  (GLUCOPHAGE-XR) 500 MG 24 hr tablet Take 1,000 mg by mouth at bedtime.   Yes Historical Provider, MD  metoprolol (LOPRESSOR) 50 MG tablet Take 50 mg by mouth at bedtime.    Yes Historical Provider, MD  niacin 250 MG tablet Take 250 mg by mouth at bedtime.    Yes Historical Provider, MD  ondansetron (ZOFRAN) 8 MG tablet Take 1 tablet (8 mg total) by mouth 2 (two) times daily as needed for refractory nausea / vomiting. Start on day 3 after chemo. 05/02/16  Yes Truitt Merle, MD  prochlorperazine (COMPAZINE) 10 MG tablet Take 1 tablet (10 mg total) by mouth every 6 (six) hours as needed (Nausea or vomiting). 05/02/16  Yes Truitt Merle, MD  Specialty Vitamins Products (VITAMINS FOR THE HAIR) TABS Take 2 tablets by mouth 2 (two) times daily.   Yes Historical Provider, MD  traMADol (ULTRAM) 50 MG tablet Take 1-2 tablets (50-100 mg total) by mouth every 6 (six) hours as needed. 03/06/16  Yes Danae Orleans, PA-C  triamterene-hydrochlorothiazide (MAXZIDE) 75-50 MG per tablet Take 0.5 tablets by mouth daily.    Yes Historical Provider, MD    Current Facility-Administered Medications  Medication Dose Route Frequency Provider Last Rate Last Dose  . 0.9 %  sodium chloride infusion   Intravenous Continuous Toy Baker, MD 100 mL/hr at 05/19/16 0125    . acetaminophen (TYLENOL) tablet 650 mg  650 mg Oral Q6H PRN Toy Baker, MD       Or  . acetaminophen (TYLENOL) suppository 650 mg  650 mg Rectal Q6H PRN Toy Baker, MD      . gabapentin (NEURONTIN) capsule 200 mg  200 mg Oral QHS Toy Baker, MD   200 mg at 05/19/16 0124  . HYDROcodone-acetaminophen (NORCO/VICODIN) 5-325 MG per tablet 1-2 tablet  1-2 tablet Oral Q4H PRN Toy Baker, MD      . insulin aspart (novoLOG) injection 0-9 Units  0-9 Units Subcutaneous Q4H Toy Baker, MD      . LORazepam (ATIVAN) tablet 0.5 mg  0.5 mg Oral Once PRN Toy Baker, MD      . Mesalamine (ASACOL) DR capsule 800 mg  800 mg Oral TID  Toy Baker, MD      . ondansetron (ZOFRAN) tablet 4 mg  4 mg Oral Q6H PRN Toy Baker, MD       Or  . ondansetron (ZOFRAN) injection 4 mg  4 mg Intravenous Q6H PRN Toy Baker, MD      . pantoprazole (PROTONIX) injection 40 mg  40 mg Intravenous Q12H Toy Baker, MD   40 mg at 05/19/16 0125  . sodium chloride flush (NS) 0.9 % injection 3 mL  3 mL Intravenous Q12H Toy Baker, MD   3 mL at 05/19/16 0126    Allergies as of 05/18/2016 - Review Complete 05/18/2016  Allergen Reaction Noted  . Lisinopril Palpitations 07/11/2015  . Augmentin [amoxicillin-pot clavulanate] Other (See Comments) 09/21/2014  . Benadryl [diphenhydramine] Itching and Anxiety 05/13/2016     Review of Systems:    Constitutional: No fever or chills Skin: No rash Cardiovascular: No chest pain Respiratory: No SOB Gastrointestinal: See HPI and otherwise negative Genitourinary: No dysuria or change in urinary frequency Neurological: No headache, dizziness or syncope Musculoskeletal: No new muscle or joint pain Hematologic: No bruising Psychiatric: No history of depression or anxiety   Physical Exam:  Vital signs in last 24 hours: Temp:  [97.6 F (36.4 C)-98 F (36.7 C)] 97.6 F (36.4 C) (04/21 0800) Pulse Rate:  [103-136] 103 (04/21 0121) Resp:  [14-24] 14 (04/21 0121) BP: (96-144)/(51-91) 112/51 (04/21 0121) SpO2:  [91 %-100 %] 96 % (04/21 0121) Weight:  [213 lb (96.6 kg)] 213 lb (96.6 kg) (04/20 1446) Last BM Date: 05/19/16 General:   Very Pleasant overweight Caucasian female appears to be in NAD, Well developed, Well nourished, alert and cooperative Head:  Normocephalic and atraumatic. Eyes:   PEERL, EOMI. No icterus. Conjunctiva pink. Ears:  Normal auditory acuity. Neck:  Supple Throat: Oral cavity and pharynx without inflammation, swelling or lesion.  Lungs: Respirations even and unlabored. Lungs clear to auscultation bilaterally.   No wheezes, crackles, or rhonchi.    Heart: Normal S1, S2. No MRG. Regular  rate and rhythm. No peripheral edema, cyanosis or pallor.  Abdomen:  Soft, nondistended, Moderate generalized tenderness. No rebound or guarding. Normal bowel sounds. No appreciable masses or hepatomegaly. Rectal:  Not performed.  Msk:  Symmetrical without gross deformities.  Extremities:  Without edema, no deformity or joint abnormality.  Neurologic:  Alert and  oriented x4;  grossly normal neurologically.  Skin:   Dry and intact without significant lesions or rashes. Psychiatric: Demonstrates good judgement and reason without abnormal affect or behaviors.   LAB RESULTS:  Recent Labs  05/18/16 1238 05/18/16 1653 05/19/16 0740  WBC 2.4* 2.5* 4.9  HGB 12.7 11.1* 11.0*  HCT 38.3 32.4* 32.3*  PLT 54* 36* 52*   BMET  Recent Labs  05/18/16 1238 05/18/16 1653 05/19/16 0740  NA 125 Repeated and Verified* 125* 130*  130*  K 3.8 3.2* 4.0  4.0  CL  --  92* 98*  98*  CO2 23 24 23  23   GLUCOSE 131 126* 101*  102*  BUN 21.0 19 19  19   CREATININE 1.2* 1.00 1.00  0.99  CALCIUM 9.2 7.9* 8.2*  8.2*   LFT  Recent Labs  05/19/16 0740  PROT 5.6*  ALBUMIN 2.4*  AST 35  ALT 26  ALKPHOS 72  BILITOT 0.8    PREVIOUS ENDOSCOPIES:            See HPI   Impression / Plan:   Impression: 1. Ulcerative colitis: Patient diagnosed in July of last year via with Dr. Fuller Plan, maintained on Asacol HD 800 mg 2 tabs 3 times a day, the patient admits she is only taking these as needed and explains she has been taking this 2--3 times a week over the past month, unsure if she is taking the right dose, started with hematochezia/diarrhea and abdominal pain at the beginning of the week after eating at a cookout; likely this represents a flare of UC, must rule out some sort of food poisoning/infectious cause 2. Nausea and vomiting: Likely related to chemotherapy and possibly above 3. Hyponatremia/hypokalemia/hypomagnesemia 4. Thrombocytopenia 5. Breast  cancer with upper-outer quadrant left breast  Plan: 1. GI pathogen panel pending, C. difficile is returned negative 2. Could consider steroids 3. Continue supportive measures 4. Please await any further recommendations from Dr. Loletha Carrow later today  Thank you for your kind consultation, we will continue to follow.  Lavone Nian Lemmon  05/19/2016, 9:52 AM Pager #: [phone]  I have reviewed the entire case in detail with the above APP and discussed the plan in detail.  Therefore, I agree with the diagnoses recorded above. In addition,  I have personally interviewed and examined the patient and have personally reviewed any abdominal/pelvic CT scan images.  My additional thoughts are as follows: Additional diagnoses: Generalized abdominal pain Acute diarrhea, suspect infectious Hematochezia, suspected to be from colitis.   While she is nontoxic, this patient seems to have a significant colitis with diffuse abdominal pain, lower tenderness and ongoing postprandial cramps and bleeding.  Her WBC is rebanding today, although I am not certain if it is merely the rebound from nadir after chemotherapy induced pancytopenia, or if it is a relative leukocytosis from this inflammatory/infectious process.  The acute onset and time course of the illness seems to speak mostly toward an infectious and probably bacterial colitis. Her husband became sick at the same time, but much less severely in for only a couple of days. She has known ulcerative colitis that, while not on a frequency of medicines prescribed,  did not seem to have any symptoms suggesting poor control up until this acute illness. It is conceivable that the immunosuppressive effects of chemotherapy may somehow affect her inflammatory bowel disease and cause an acute flare.  My plan is as follows:  Bentyl 20 mg 3 times a day and bedtime to help control cramps.  I've asked the nurse to contact the hospitalist to consider IV narcotics to  help control her pain. Vicodin did not seem to help, and she recalls receiving some relief from morphine in the ED.  I'm awaiting the results of her stool pathogen panel. If it shows positive result for a bacteria such as Escherichia coli or Salmonella, her immunocompromised state and overall clinical condition will warrant a 3 day antibiotic course.  If negative, it is possible that it may still have been a bacterial colitis but the actual pathogen has cleared and is not detectable. Still, we would have to assume it could be a flare of the UC in that case, and I would opt to give her mesalamine 60 g enema at nighttime. Either way, we have resumed her 3 time daily dose of oral mesalamine.  I agree with hematology that if she still is active bleeding and platelets dropped below 50,000, platelet transfusion is in order.  We will follow her with you. I will be on-call until Sunday evening, and then my partner Dr. Ardis Hughs will be on overnight until Dr. Carlean Purl has the service Monday morning.  Nelida Meuse III Pager [phone]  Mon-Fri 8a-5p [phone] after 5p, weekends, holidays

## 2016-05-20 LAB — CBC
HCT: 28.6 % — ABNORMAL LOW (ref 36.0–46.0)
HCT: 30.2 % — ABNORMAL LOW (ref 36.0–46.0)
HCT: 31.5 % — ABNORMAL LOW (ref 36.0–46.0)
Hemoglobin: 9.6 g/dL — ABNORMAL LOW (ref 12.0–15.0)
Hemoglobin: 10.1 g/dL — ABNORMAL LOW (ref 12.0–15.0)
Hemoglobin: 10.5 g/dL — ABNORMAL LOW (ref 12.0–15.0)
MCH: 28.5 pg (ref 26.0–34.0)
MCH: 28.5 pg (ref 26.0–34.0)
MCH: 28.5 pg (ref 26.0–34.0)
MCHC: 33.6 g/dL (ref 30.0–36.0)
MCHC: 33.4 g/dL (ref 30.0–36.0)
MCHC: 33.3 g/dL (ref 30.0–36.0)
MCV: 84.9 fL (ref 78.0–100.0)
MCV: 85.1 fL (ref 78.0–100.0)
MCV: 85.6 fL (ref 78.0–100.0)
PLATELETS: 49 10*3/uL — AB (ref 150–400)
PLATELETS: 58 10*3/uL — AB (ref 150–400)
PLATELETS: 64 10*3/uL — AB (ref 150–400)
RBC: 3.37 MIL/uL — ABNORMAL LOW (ref 3.87–5.11)
RBC: 3.55 MIL/uL — ABNORMAL LOW (ref 3.87–5.11)
RBC: 3.68 MIL/uL — ABNORMAL LOW (ref 3.87–5.11)
RDW: 13.5 % (ref 11.5–15.5)
RDW: 13.5 % (ref 11.5–15.5)
RDW: 13.6 % (ref 11.5–15.5)
WBC: 6.8 10*3/uL (ref 4.0–10.5)
WBC: 7 10*3/uL (ref 4.0–10.5)
WBC: 6.9 10*3/uL (ref 4.0–10.5)

## 2016-05-20 LAB — PREPARE PLATELET PHERESIS: UNIT DIVISION: 0

## 2016-05-20 LAB — HEMOGLOBIN A1C
Hgb A1c MFr Bld: 5.7 % — ABNORMAL HIGH (ref 4.8–5.6)
MEAN PLASMA GLUCOSE: 117 mg/dL

## 2016-05-20 LAB — BASIC METABOLIC PANEL
Anion gap: 6 (ref 5–15)
BUN: 10 mg/dL (ref 6–20)
CALCIUM: 7.7 mg/dL — AB (ref 8.9–10.3)
CO2: 25 mmol/L (ref 22–32)
Chloride: 103 mmol/L (ref 101–111)
Creatinine, Ser: 0.77 mg/dL (ref 0.44–1.00)
GFR calc Af Amer: >60 mL/min (ref >60)
GLUCOSE: 102 mg/dL — AB (ref 65–99)
Potassium: 3.2 mmol/L — ABNORMAL LOW (ref 3.5–5.1)
Sodium: 134 mmol/L — ABNORMAL LOW (ref 135–145)

## 2016-05-20 LAB — GLUCOSE, CAPILLARY
GLUCOSE-CAPILLARY: 96 mg/dL (ref 65–99)
GLUCOSE-CAPILLARY: 157 mg/dL — AB (ref 65–99)
Glucose-Capillary: 112 mg/dL — ABNORMAL HIGH (ref 65–99)
Glucose-Capillary: 142 mg/dL — ABNORMAL HIGH (ref 65–99)
Glucose-Capillary: 73 mg/dL (ref 65–99)
Glucose-Capillary: 97 mg/dL (ref 65–99)

## 2016-05-20 LAB — BPAM PLATELET PHERESIS
BLOOD PRODUCT EXPIRATION DATE: 201804202359
ISSUE DATE / TIME: 201804202312
Unit Type and Rh: 5100

## 2016-05-20 LAB — MAGNESIUM: Magnesium: 2.1 mg/dL (ref 1.7–2.4)

## 2016-05-20 MED ORDER — PREDNISONE 5 MG PO TABS
30.0000 mg | ORAL_TABLET | Freq: Every day | ORAL | Status: DC
  Administered 2016-05-20: 30 mg via ORAL
  Filled 2016-05-20: qty 1

## 2016-05-20 MED ORDER — MESALAMINE 4 G RE ENEM
4.0000 g | ENEMA | Freq: Every day | RECTAL | Status: DC
  Administered 2016-05-21: 4 g via RECTAL
  Filled 2016-05-20: qty 60

## 2016-05-20 MED ORDER — SODIUM CHLORIDE 0.9 % IV SOLN
Freq: Once | INTRAVENOUS | Status: AC
  Administered 2016-05-20: 12:00:00 via INTRAVENOUS

## 2016-05-20 MED ORDER — POTASSIUM CHLORIDE 10 MEQ/100ML IV SOLN
10.0000 meq | INTRAVENOUS | Status: AC
  Administered 2016-05-20 (×3): 10 meq via INTRAVENOUS
  Filled 2016-05-20 (×3): qty 100

## 2016-05-20 MED ORDER — SODIUM CHLORIDE 0.9 % IV SOLN: 30.0000 meq | Freq: Once | INTRAVENOUS | Status: DC

## 2016-05-20 NOTE — Progress Notes (Signed)
PROGRESS NOTE [facility]   Brandi Dickson   ZWC:[id number] DOB: [birth year]  DOA: 05/18/2016 PCP: Orpah Melter, MD   Brief Narrative:  56 y.o.femalewith medical history significant of 68 diagnosed breast cancer, DM 2 asthma, HTN, neuropathy secondary to diabetes, ulcerative colitis. Presented with vomiting and diarrhea. Found to have rectal bleeding and thrombocytopenia.   Subjective: Patient seen and examined with husband at bedside. Patient report less cramping, continues to have bloody BM. No tolerating clear liquid diet. No acute event overnight.   Assessment & Plan: GI bleeding/hematochezia with diarrhea secondary to exacerbation of ulcerative colitis -In the setting of thrombocytopenia with recent chemotherapy -C Diff PCR negative, GI pathogen pending  -GI on board  -Continue mesalamine, adding rectal Mesalamine tonight. -Likely had VGE early this weak which caused UC flare   Thrombocytopenia -Likely secondary to chemotherapy received  -Was transfused 1 unit of platelet yesterday, will transfuse another one today as GIB continues  -Platelets 49 today  -Oncologist recommending to keep platelets >50K -Monitor CBC   Hyponatremia - improved  -Likely secondary to GI losses and dehydration -Sodium was 125 upon admission, currently 134 -On IVF - monitor with sign of fluid overload   Hypomagnesemia/Hypokalemia  -Replaced/resolved  Diabetes mellitus, type II with neuropathy -Hold metformin -Placed on ISS and CBG monitoring -Continue gabapentin for neuropathy  Breast cancer of the upper-outer quadrant of the left breast -Stage IIA -Followed by [name] -Recently received chemotherapy, Docetaxel, carboplatin, herceptin and pejeta  -Started on 05/11/16 -Discussed with HemOnc   DVT prophylaxis: SCD's  Code Status: FULL Family Communication:  Disposition Plan: Home when medically stable   Consultants:   GI - [place]  Procedures:   None    Antimicrobials: Anti-infectives    None        Objective: Vitals:   05/20/16 0200 05/20/16 0400 05/20/16 0600 05/20/16 1136  BP: 140/71 132/66 113/70   Pulse: 94 84 84   Resp: (!) 30 (!) 21 12   Temp:  97.6 F (36.4 C)  98 F (36.7 C)  TempSrc:  Axillary  Oral  SpO2: 100% 96% 95%   Weight:      Height:        Intake/Output Summary (Last 24 hours) at 05/20/16 1351 Last data filed at 05/20/16 1224  Gross per 24 hour  Intake             2340 ml  Output                4 ml  Net             2336 ml   Filed Weights   05/18/16 1446  Weight: 96.6 kg (213 lb)    Examination:  General exam: Appears calm and comfortable  Respiratory system: Clear to auscultation. Chemo port in R chest in place, no signs of infection Cardiovascular system: S1 & S2 heard, RRR. No JVD, murmurs, rubs or gallops Gastrointestinal system: Abd is non distended and soft, mild b/l lower tenderness.  Central nervous system: Alert and oriented.  Extremities: Trace pedal edema.  Skin: No rashes, lesions or ulcers Psychiatry: Judgement and insight appear normal. Mood & affect appropriate.    Data Reviewed: I have personally reviewed following labs and imaging studies  CBC:  Recent Labs Lab 05/18/16 1238 05/18/16 1653 05/19/16 0740 05/19/16 1807 05/20/16 0420 05/20/16 0800  WBC 2.4* 2.5* 4.9 6.7 6.8 7.0  NEUTROABS 1.4* 1.2* 2.8  --   --   --   HGB  12.7 11.1* 11.0* 10.4* 9.6* 10.1*  HCT 38.3 32.4* 32.3* 30.1* 28.6* 30.2*  MCV 85.3 80.8 82.8 83.6 84.9 85.1  PLT 54* 36* 52* 53* 49* 58*   Basic Metabolic Panel:  Recent Labs Lab 05/13/16 1419 05/18/16 1238 05/18/16 1653 05/19/16 0740 05/20/16 0420  NA 136 125 Repeated and Verified* 125* 130*  130* 134*  K 4.2 3.8 3.2* 4.0  4.0 3.2*  CL 103  --  92* 98*  98* 103  CO2 24 23 24 23  23 25   GLUCOSE 123* 131 126* 101*  102* 102*  BUN 17 21.0 19 19  19 10   CREATININE 0.78 1.2* 1.00 1.00  0.99 0.77  CALCIUM 9.1 9.2 7.9* 8.2*   8.2* 7.7*  MG  --   --  1.4* 2.4 2.1  PHOS  --   --  2.8 2.6  --    GFR: Estimated Creatinine Clearance: 87.6 mL/min (by C-G formula based on SCr of 0.77 mg/dL). Liver Function Tests:  Recent Labs Lab 05/13/16 1419 05/18/16 1238 05/19/16 0740  AST 26 23 35  ALT 26 19 26   ALKPHOS 70 85 72  BILITOT 0.8 1.21* 0.8  PROT 7.9 6.7 5.6*  ALBUMIN 3.8 2.7* 2.4*   Cardiac Enzymes:  Recent Labs Lab 05/13/16 1431  TROPONINI <0.03   CBG:  Recent Labs Lab 05/19/16 2034 05/19/16 2347 05/20/16 0413 05/20/16 0832 05/20/16 1245  GLUCAP 148* 112* 96 97 142*   Thyroid Function Tests:  Recent Labs  05/19/16 0740  TSH 1.491   Sepsis Labs:  Recent Labs Lab 05/18/16 1704  LATICACIDVEN 1.72    Recent Results (from the past 240 hour(s))  MRSA PCR Screening     Status: None   Collection Time: 05/18/16 10:50 PM  Result Value Ref Range Status   MRSA by PCR NEGATIVE NEGATIVE Final    Comment:        The GeneXpert MRSA Assay (FDA approved for NASAL specimens only), is one component of a comprehensive MRSA colonization surveillance program. It is not intended to diagnose MRSA infection nor to guide or monitor treatment for MRSA infections.   Gastrointestinal Panel by PCR , Stool     Status: None   Collection Time: 05/19/16 12:15 AM  Result Value Ref Range Status   Campylobacter species NOT DETECTED NOT DETECTED Final   Plesimonas shigelloides NOT DETECTED NOT DETECTED Final   Salmonella species NOT DETECTED NOT DETECTED Final   Yersinia enterocolitica NOT DETECTED NOT DETECTED Final   Vibrio species NOT DETECTED NOT DETECTED Final   Vibrio cholerae NOT DETECTED NOT DETECTED Final   Enteroaggregative E coli (EAEC) NOT DETECTED NOT DETECTED Final   Enteropathogenic E coli (EPEC) NOT DETECTED NOT DETECTED Final   Enterotoxigenic E coli (ETEC) NOT DETECTED NOT DETECTED Final   Shiga like toxin producing E coli (STEC) NOT DETECTED NOT DETECTED Final    Shigella/Enteroinvasive E coli (EIEC) NOT DETECTED NOT DETECTED Final   Cryptosporidium NOT DETECTED NOT DETECTED Final   Cyclospora cayetanensis NOT DETECTED NOT DETECTED Final   Entamoeba histolytica NOT DETECTED NOT DETECTED Final   Giardia lamblia NOT DETECTED NOT DETECTED Final   Adenovirus F40/41 NOT DETECTED NOT DETECTED Final   Astrovirus NOT DETECTED NOT DETECTED Final   Norovirus GI/GII NOT DETECTED NOT DETECTED Final   Rotavirus A NOT DETECTED NOT DETECTED Final   Sapovirus (I, II, IV, and V) NOT DETECTED NOT DETECTED Final  C difficile quick scan w PCR reflex     Status:  None   Collection Time: 05/19/16 12:15 AM  Result Value Ref Range Status   C Diff antigen NEGATIVE NEGATIVE Final   C Diff toxin NEGATIVE NEGATIVE Final   C Diff interpretation No C. difficile detected.  Final     Scheduled Meds: . dicyclomine  20 mg Oral TID AC & HS  . feeding supplement  1 Container Oral TID BM  . gabapentin  200 mg Oral QHS  . insulin aspart  0-9 Units Subcutaneous Q4H  . Mesalamine  800 mg Oral TID  . mesalamine  4 g Rectal QHS  . pantoprazole (PROTONIX) IV  40 mg Intravenous Q12H  . sodium chloride flush  3 mL Intravenous Q12H   Continuous Infusions: . sodium chloride 1,000 mL (05/20/16 0916)  . potassium chloride Stopped (05/20/16 1259)     LOS: 2 days    Chipper Oman, MD Pager: Text Page via [email or web address]  [phone]  If 7PM-7AM, please contact night-coverage [email or web address] Password [email or web address] 05/20/2016, 1:51 PM

## 2016-05-20 NOTE — Progress Notes (Signed)
    Progress Note   Subjective   Chief Complaint: Ulcerative Colitis, hematochezia, diarrhea, nausea and vomiting  This morning the patient recalls that overnight she was really awakened around 2:00 in the morning when someone was walking by her door and "they thought I had sleep apnea", apparently they rushed in the room and applied oxygen and patient was aggravated by this as she was not talked to before they did this and there was some poor communication. She was able to sleep afterwards with the assistance of medication. She has continued with multiple loose bloody bowel movements this morning and abdominal pain, cramping in nature and worse before a bowel movement. Her husband is by her bedside this morning.    Objective   Vital signs in last 24 hours: Temp:  [97.6 F (36.4 C)-98.6 F (37 C)] 98 F (36.7 C) (04/22 1136) Pulse Rate:  [84-102] 84 (04/22 0600) Resp:  [12-30] 12 (04/22 0600) BP: (113-140)/(57-78) 113/70 (04/22 0600) SpO2:  [95 %-100 %] 95 % (04/22 0600) Last BM Date: 05/19/16 General: Overweight Caucasian female in NAD Heart:  Regular rate and rhythm; no murmurs Lungs: Respirations even and unlabored, lungs CTA bilaterally Abdomen:  Soft, mild generalized ttp, somewhat worse in b/l lower quadrants and nondistended. Normal bowel sounds. Extremities:  Without edema. Neurologic:  Alert and oriented,  grossly normal neurologically. Psych:  Cooperative. Normal mood and affect.  Intake/Output from previous day: 04/21 0701 - 04/22 0700 In: 1590 [P.O.:390; I.V.:1200] Out: 8 [Stool:8]  Lab Results:  Recent Labs  05/19/16 1807 05/20/16 0420 05/20/16 0800  WBC 6.7 6.8 7.0  HGB 10.4* 9.6* 10.1*  HCT 30.1* 28.6* 30.2*  PLT 53* 49* 58*   BMET  Recent Labs  05/18/16 1653 05/19/16 0740 05/20/16 0420  NA 125* 130*  130* 134*  K 3.2* 4.0  4.0 3.2*  CL 92* 98*  98* 103  CO2 24 23  23 25   GLUCOSE 126* 101*  102* 102*  BUN 19 19  19 10   CREATININE 1.00  1.00  0.99 0.77  CALCIUM 7.9* 8.2*  8.2* 7.7*   LFT  Recent Labs  05/19/16 0740  PROT 5.6*  ALBUMIN 2.4*  AST 35  ALT 26  ALKPHOS 72  BILITOT 0.8      Assessment / Plan:   Assessment: 1. Ulcerative Colitis:Gi pathogen panel negative, likely abdominal pain and hematochezia are due to acute flare of UC 2. Hematochezia 3. Generalized Abdominal Pain 4. Diarrhea  Plan: 1. GI pathogen panel negative. 2. Will start Mesalamine 60g enema at nighttime 3. Continue supportive measures 4. Continue Oral Mesalamine TID 5. Please await further recommendations from Dr. Loletha Carrow later this afternoon.  Thank you for your kind consultation, we will continue to follow.   LOS: 2 days   Levin Erp  05/20/2016, 11:39 AM  Pager # [phone]

## 2016-05-21 DIAGNOSIS — K51311 Ulcerative (chronic) rectosigmoiditis with rectal bleeding: Principal | ICD-10-CM

## 2016-05-21 DIAGNOSIS — D6481 Anemia due to antineoplastic chemotherapy: Secondary | ICD-10-CM

## 2016-05-21 DIAGNOSIS — E860 Dehydration: Secondary | ICD-10-CM

## 2016-05-21 DIAGNOSIS — R197 Diarrhea, unspecified: Secondary | ICD-10-CM

## 2016-05-21 DIAGNOSIS — E871 Hypo-osmolality and hyponatremia: Secondary | ICD-10-CM

## 2016-05-21 DIAGNOSIS — E119 Type 2 diabetes mellitus without complications: Secondary | ICD-10-CM

## 2016-05-21 DIAGNOSIS — D6959 Other secondary thrombocytopenia: Secondary | ICD-10-CM

## 2016-05-21 DIAGNOSIS — C50919 Malignant neoplasm of unspecified site of unspecified female breast: Secondary | ICD-10-CM

## 2016-05-21 LAB — GLUCOSE, CAPILLARY
GLUCOSE-CAPILLARY: 107 mg/dL — AB (ref 65–99)
GLUCOSE-CAPILLARY: 128 mg/dL — AB (ref 65–99)
GLUCOSE-CAPILLARY: 128 mg/dL — AB (ref 65–99)
GLUCOSE-CAPILLARY: 142 mg/dL — AB (ref 65–99)
Glucose-Capillary: 116 mg/dL — ABNORMAL HIGH (ref 65–99)
Glucose-Capillary: 103 mg/dL — ABNORMAL HIGH (ref 65–99)

## 2016-05-21 LAB — BASIC METABOLIC PANEL
Anion gap: 8 (ref 5–15)
BUN: <5 mg/dL — ABNORMAL LOW (ref 6–20)
CALCIUM: 7.9 mg/dL — AB (ref 8.9–10.3)
CO2: 24 mmol/L (ref 22–32)
CREATININE: 0.7 mg/dL (ref 0.44–1.00)
Chloride: 104 mmol/L (ref 101–111)
GFR calc Af Amer: >60 mL/min (ref >60)
GFR calc non Af Amer: >60 mL/min (ref >60)
GLUCOSE: 139 mg/dL — AB (ref 65–99)
Potassium: 3.7 mmol/L (ref 3.5–5.1)
Sodium: 136 mmol/L (ref 135–145)

## 2016-05-21 LAB — MAGNESIUM: Magnesium: 2.1 mg/dL (ref 1.7–2.4)

## 2016-05-21 MED ORDER — METHYLPREDNISOLONE SODIUM SUCC 125 MG IJ SOLR
60.0000 mg | Freq: Every day | INTRAMUSCULAR | Status: DC
  Administered 2016-05-21 – 2016-05-24 (×4): 60 mg via INTRAVENOUS
  Filled 2016-05-21 (×4): qty 2

## 2016-05-21 MED ORDER — SODIUM CHLORIDE 0.9% FLUSH
10.0000 mL | INTRAVENOUS | Status: DC | PRN
  Administered 2016-05-22 – 2016-05-24 (×2): 10 mL
  Filled 2016-05-21 (×2): qty 40

## 2016-05-21 MED ORDER — MESALAMINE 400 MG PO CPDR
1600.0000 mg | DELAYED_RELEASE_CAPSULE | Freq: Three times a day (TID) | ORAL | Status: DC
  Administered 2016-05-21 – 2016-05-24 (×8): 1600 mg via ORAL
  Filled 2016-05-21 (×9): qty 4

## 2016-05-21 NOTE — Progress Notes (Signed)
PROGRESS NOTE [facility]   Brandi Dickson   YIR:[id number] DOB: Mar 09, 1960  DOA: 05/18/2016 PCP: Orpah Melter, MD   Brief Narrative:  56 y.o.femalewith medical history significant of 68 diagnosed breast cancer, DM 2 asthma, HTN, neuropathy secondary to diabetes, ulcerative colitis. Presented with vomiting and diarrhea. Found to have rectal bleeding and thrombocytopenia.   Subjective: Patient seen and examined, doing better, diarrhea has decrease in frequency, still bloody though. Patient continues to have cramps.  Assessment & Plan: GI bleeding/hematochezia with diarrhea secondary to exacerbation of ulcerative colitis -In the setting of thrombocytopenia with recent chemotherapy -C Diff PCR negative, GI pathogen pending  -GI on board continue mesalamine, adding rectal Mesalamine today  -Started on prednisone last night - dose increased today  -UC flare triggered by food poisoning/VGE   Thrombocytopenia - improved  -Likely secondary to chemotherapy received  -s/p 2 pack of platelets during hospital stay   -Current platelets 64 -Oncologist recommending to keep platelets >50K -Monitor CBC   Hyponatremia - Resolved  -Likely secondary to GI losses and dehydration -Sodium was 125 upon admission -d/c IVF   Hypomagnesemia/Hypokalemia  -Replaced/resolved  Diabetes mellitus, type II with neuropathy -Hold metformin -Placed on ISS and CBG monitoring -Continue gabapentin for neuropathy  Breast cancer of the upper-outer quadrant of the left breast -Stage IIA -Followed by [name] -Recently received chemotherapy, Docetaxel, carboplatin, herceptin and pejeta   DVT prophylaxis: SCD's  Code Status: FULL Family Communication:  Disposition Plan: Home when medically stable   Consultants:   GI - [place]  Procedures:   None   Antimicrobials: Anti-infectives    None       Objective: Vitals:   05/21/16 0600 05/21/16 0800 05/21/16 1000 05/21/16 1030    BP: (!) 148/76 (!) 147/93 (!) 142/77 (!) 161/81  Pulse:    83  Resp: 13 14 20 20   Temp:  97.7 F (36.5 C)  98.1 F (36.7 C)  TempSrc:  Oral  Oral  SpO2:    100%  Weight:      Height:        Intake/Output Summary (Last 24 hours) at 05/21/16 1216 Last data filed at 05/21/16 1000  Gross per 24 hour  Intake             4174 ml  Output              252 ml  Net             3922 ml   Filed Weights   05/18/16 1446  Weight: 96.6 kg (213 lb)    Examination:  General exam: NAD Respiratory system: Chemo port in place, Clear to auscultation  Cardiovascular system: S1S2 RRR  Gastrointestinal system: Soft non distended, no tender Central nervous system: AAOx3  Extremities: No LE edema  Skin: No rashes  Psychiatry: Mood appropriate   Data Reviewed: I have personally reviewed following labs and imaging studies  CBC:  Recent Labs Lab 05/18/16 1238  05/18/16 1653 05/19/16 0740 05/19/16 1807 05/20/16 0420 05/20/16 0800 05/20/16 1800  WBC 2.4*  < > 2.5* 4.9 6.7 6.8 7.0 6.9  NEUTROABS 1.4*  --  1.2* 2.8  --   --   --   --   HGB 12.7  < > 11.1* 11.0* 10.4* 9.6* 10.1* 10.5*  HCT 38.3  < > 32.4* 32.3* 30.1* 28.6* 30.2* 31.5*  MCV 85.3  < > 80.8 82.8 83.6 84.9 85.1 85.6  PLT 54*  < > 36* 52* 53* 49* 58*  64*  < > = values in this interval not displayed. Basic Metabolic Panel:  Recent Labs Lab 05/18/16 1238 05/18/16 1653 05/19/16 0740 05/20/16 0420 05/21/16 0430  NA 125 Repeated and Verified* 125* 130*  130* 134* 136  K 3.8 3.2* 4.0  4.0 3.2* 3.7  CL  --  92* 98*  98* 103 104  CO2 23 24 23  23 25 24   GLUCOSE 131 126* 101*  102* 102* 139*  BUN 21.0 19 19  19 10  <5*  CREATININE 1.2* 1.00 1.00  0.99 0.77 0.70  CALCIUM 9.2 7.9* 8.2*  8.2* 7.7* 7.9*  MG  --  1.4* 2.4 2.1 2.1  PHOS  --  2.8 2.6  --   --    GFR: Estimated Creatinine Clearance: 87.6 mL/min (by C-G formula based on SCr of 0.7 mg/dL). Liver Function Tests:  Recent Labs Lab 05/18/16 1238  05/19/16 0740  AST 23 35  ALT 19 26  ALKPHOS 85 72  BILITOT 1.21* 0.8  PROT 6.7 5.6*  ALBUMIN 2.7* 2.4*   Cardiac Enzymes: No results for input(s): CKTOTAL, CKMB, CKMBINDEX, TROPONINI in the last 168 hours. CBG:  Recent Labs Lab 05/20/16 1957 05/21/16 0032 05/21/16 0424 05/21/16 0722 05/21/16 1142  GLUCAP 157* 128* 128* 116* 103*   Thyroid Function Tests:  Recent Labs  05/19/16 0740  TSH 1.491   Sepsis Labs:  Recent Labs Lab 05/18/16 1704  LATICACIDVEN 1.72    Recent Results (from the past 240 hour(s))  MRSA PCR Screening     Status: None   Collection Time: 05/18/16 10:50 PM  Result Value Ref Range Status   MRSA by PCR NEGATIVE NEGATIVE Final    Comment:        The GeneXpert MRSA Assay (FDA approved for NASAL specimens only), is one component of a comprehensive MRSA colonization surveillance program. It is not intended to diagnose MRSA infection nor to guide or monitor treatment for MRSA infections.   Gastrointestinal Panel by PCR , Stool     Status: None   Collection Time: 05/19/16 12:15 AM  Result Value Ref Range Status   Campylobacter species NOT DETECTED NOT DETECTED Final   Plesimonas shigelloides NOT DETECTED NOT DETECTED Final   Salmonella species NOT DETECTED NOT DETECTED Final   Yersinia enterocolitica NOT DETECTED NOT DETECTED Final   Vibrio species NOT DETECTED NOT DETECTED Final   Vibrio cholerae NOT DETECTED NOT DETECTED Final   Enteroaggregative E coli (EAEC) NOT DETECTED NOT DETECTED Final   Enteropathogenic E coli (EPEC) NOT DETECTED NOT DETECTED Final   Enterotoxigenic E coli (ETEC) NOT DETECTED NOT DETECTED Final   Shiga like toxin producing E coli (STEC) NOT DETECTED NOT DETECTED Final   Shigella/Enteroinvasive E coli (EIEC) NOT DETECTED NOT DETECTED Final   Cryptosporidium NOT DETECTED NOT DETECTED Final   Cyclospora cayetanensis NOT DETECTED NOT DETECTED Final   Entamoeba histolytica NOT DETECTED NOT DETECTED Final   Giardia  lamblia NOT DETECTED NOT DETECTED Final   Adenovirus F40/41 NOT DETECTED NOT DETECTED Final   Astrovirus NOT DETECTED NOT DETECTED Final   Norovirus GI/GII NOT DETECTED NOT DETECTED Final   Rotavirus A NOT DETECTED NOT DETECTED Final   Sapovirus (I, II, IV, and V) NOT DETECTED NOT DETECTED Final  C difficile quick scan w PCR reflex     Status: None   Collection Time: 05/19/16 12:15 AM  Result Value Ref Range Status   C Diff antigen NEGATIVE NEGATIVE Final   C Diff toxin NEGATIVE NEGATIVE  Final   C Diff interpretation No C. difficile detected.  Final     Scheduled Meds: . dicyclomine  20 mg Oral TID AC & HS  . feeding supplement  1 Container Oral TID BM  . gabapentin  200 mg Oral QHS  . insulin aspart  0-9 Units Subcutaneous Q4H  . Mesalamine  800 mg Oral TID  . mesalamine  4 g Rectal QHS  . pantoprazole (PROTONIX) IV  40 mg Intravenous Q12H  . predniSONE  30 mg Oral QAC supper  . sodium chloride flush  3 mL Intravenous Q12H   Continuous Infusions: . sodium chloride Stopped (05/21/16 1012)     LOS: 3 days    Chipper Oman, MD Pager: Text Page via [email or web address]  [phone]  If 7PM-7AM, please contact night-coverage [email or web address] Password [facility] 05/21/2016, 12:16 PM

## 2016-05-21 NOTE — Progress Notes (Signed)
     [facility] Progress Note  Assessment / Plan: 1. Ulcerative Colitis, left sided:  GI pathogen panel negative, likely has an acute flare of UC possibly triggered by food poisoning and in combination with immunosuppressive effect of her first chemo.  On oral mesalamine, only 2.4 grams daily.  Mesalamine enemas ordered at bedtime, but she asked if she could do it during the day (just going to receive first one today).  Prednisone 30 mg just started 4/22.  Continue Bentyl for cramping. 2.  Left breast cancer 3.  Thrombocytopenia 4.  Hyponatremia:  Resolved.   LOS: 3 days   ZEHR, JESSICA D.  05/21/2016, 8:59 AM  Pager number [phone]    [place] Attending   I have taken an interval history, reviewed the chart and examined the patient. I agree with the Advanced Practitioner's note, impression and recommendations.    She thinks she is a bit better but still w/ bloody diarrhea. Cannot retain enema Wants full liquids - believes diarrhea worse when she is on high sugar foods like clears diet  Plan:  Full liquids Solumedrol 60 mg qd Increase oral mesalamine to 4.8 g/day  Gatha Mayer, MD, [facility] [phone] (pager) [phone] after 5 PM, weekends and holidays  05/21/2016 4:12 PM    CC:  Bloody diarrhea, abdominal pain  Subjective:  Still not feeling great.  Still having a lot of abdominal cramping.  Diarrhea is decreasing in frequency.  Just had a small liquid BM that I saw--looked like muddy water, maybe small amount of blood but not extremely obvious.  Objective:  Vital signs in last 24 hours: Temp:  [97.7 F (36.5 C)-98.4 F (36.9 C)] 97.7 F (36.5 C) (04/23 0800) Pulse Rate:  [86-112] 88 (04/23 0400) Resp:  [13-26] 13 (04/23 0600) BP: (103-161)/(48-104) 148/76 (04/23 0600) SpO2:  [100 %] 100 % (04/22 2000) Last BM Date: 05/20/16 General:  Alert, Well-developed, in NAD Heart:  Regular rate and rhythm; no murmurs Pulm:   CTAB.  No increased WOB. Abdomen:  Soft, non-distended.  BS present.  Minimal LLQ TTP. Extremities:  Without edema. Neurologic:  Alert and oriented x 4;  grossly normal neurologically. Psych:  Alert and cooperative. Normal mood and affect.  Intake/Output from previous day: 04/22 0701 - 04/23 0700 In: 3969 [P.O.:1530; I.V.:2025; Blood:414] Out: 252 [Urine:250; Stool:2]  Lab Results:  Recent Labs  05/20/16 0420 05/20/16 0800 05/20/16 1800  WBC 6.8 7.0 6.9  HGB 9.6* 10.1* 10.5*  HCT 28.6* 30.2* 31.5*  PLT 49* 58* 64*   BMET  Recent Labs  05/19/16 0740 05/20/16 0420 05/21/16 0430  NA 130*  130* 134* 136  K 4.0  4.0 3.2* 3.7  CL 98*  98* 103 104  CO2 23  23 25 24   GLUCOSE 101*  102* 102* 139*  BUN 19  19 10  <5*  CREATININE 1.00  0.99 0.77 0.70  CALCIUM 8.2*  8.2* 7.7* 7.9*   LFT  Recent Labs  05/19/16 0740  PROT 5.6*  ALBUMIN 2.4*  AST 35  ALT 26  ALKPHOS 72  BILITOT 0.8

## 2016-05-21 NOTE — Progress Notes (Signed)
Brandi Dickson   DOB:12-20-1960   ID#:[id number]   GBM#:[id number]  Oncology follow up  Subjective: Patient is well-known to me, recently started neoadjuvant chemotherapy for breast cancer. She was admitted for severe diarrhea. Overall feels better today, still has diarrhea, unclear liquid.    Objective:  Vitals:   05/21/16 1030 05/21/16 1415  BP: (!) 161/81 130/86  Pulse: 83 76  Resp: 20 20  Temp: 98.1 F (36.7 C) 98 F (36.7 C)    Body mass index is 36.56 kg/m.  Intake/Output Summary (Last 24 hours) at 05/21/16 1826 Last data filed at 05/21/16 1700  Gross per 24 hour  Intake             3899 ml  Output              252 ml  Net             3647 ml     Sclerae unicteric  Oropharynx clear  No peripheral adenopathy  Lungs clear -- no rales or rhonchi  Heart regular rate and rhythm  Abdomen benign  MSK no focal spinal tenderness, no peripheral edema  Neuro nonfocal    CBG (last 3)   Recent Labs  05/21/16 0722 05/21/16 1142 05/21/16 1652  GLUCAP 116* 103* 107*     Labs:  Lab Results  Component Value Date   WBC 6.9 05/20/2016   HGB 10.5 (L) 05/20/2016   HCT 31.5 (L) 05/20/2016   MCV 85.6 05/20/2016   PLT 64 (L) 05/20/2016   NEUTROABS 2.8 05/19/2016   CMP Latest Ref Rng & Units 05/21/2016 05/20/2016 05/19/2016  Glucose 65 - 99 mg/dL 139(H) 102(H) 101(H)  BUN 6 - 20 mg/dL <5(L) 10 19  Creatinine 0.44 - 1.00 mg/dL 0.70 0.77 1.00  Sodium 135 - 145 mmol/L 136 134(L) 130(L)  Potassium 3.5 - 5.1 mmol/L 3.7 3.2(L) 4.0  Chloride 101 - 111 mmol/L 104 103 98(L)  CO2 22 - 32 mmol/L 24 25 23   Calcium 8.9 - 10.3 mg/dL 7.9(L) 7.7(L) 8.2(L)  Total Protein 6.5 - 8.1 g/dL - - 5.6(L)  Total Bilirubin 0.3 - 1.2 mg/dL - - 0.8  Alkaline Phos 38 - 126 U/L - - 72  AST 15 - 41 U/L - - 35  ALT 14 - 54 U/L - - 26     Urine Studies No results for input(s): UHGB, CRYS in the last 72 hours.  Invalid input(s): UACOL, UAPR, USPG, UPH, UTP, UGL, UKET, UBIL, UNIT, UROB, [place],  UEPI, UWBC, [place], [place], [place], [place], [place]  Basic Metabolic Panel:  Recent Labs Lab 05/18/16 1238  05/18/16 1653 05/19/16 0740 05/20/16 0420 05/21/16 0430  NA 125 Repeated and Verified*  --  125* 130*  130* 134* 136  K 3.8  < > 3.2* 4.0  4.0 3.2* 3.7  CL  --   --  92* 98*  98* 103 104  CO2 23  --  24 23  23 25 24   GLUCOSE 131  --  126* 101*  102* 102* 139*  BUN 21.0  --  19 19  19 10  <5*  CREATININE 1.2*  --  1.00 1.00  0.99 0.77 0.70  CALCIUM 9.2  --  7.9* 8.2*  8.2* 7.7* 7.9*  MG  --   --  1.4* 2.4 2.1 2.1  PHOS  --   --  2.8 2.6  --   --   < > = values in this interval not displayed. GFR Estimated Creatinine Clearance: 87.6  mL/min (by C-G formula based on SCr of 0.7 mg/dL). Liver Function Tests:  Recent Labs Lab 05/18/16 1238 05/19/16 0740  AST 23 35  ALT 19 26  ALKPHOS 85 72  BILITOT 1.21* 0.8  PROT 6.7 5.6*  ALBUMIN 2.7* 2.4*   No results for input(s): LIPASE, AMYLASE in the last 168 hours. No results for input(s): AMMONIA in the last 168 hours. Coagulation profile No results for input(s): INR, PROTIME in the last 168 hours.  CBC:  Recent Labs Lab 05/18/16 1238  05/18/16 1653 05/19/16 0740 05/19/16 1807 05/20/16 0420 05/20/16 0800 05/20/16 1800  WBC 2.4*  < > 2.5* 4.9 6.7 6.8 7.0 6.9  NEUTROABS 1.4*  --  1.2* 2.8  --   --   --   --   HGB 12.7  < > 11.1* 11.0* 10.4* 9.6* 10.1* 10.5*  HCT 38.3  < > 32.4* 32.3* 30.1* 28.6* 30.2* 31.5*  MCV 85.3  < > 80.8 82.8 83.6 84.9 85.1 85.6  PLT 54*  < > 36* 52* 53* 49* 58* 64*  < > = values in this interval not displayed. Cardiac Enzymes: No results for input(s): CKTOTAL, CKMB, CKMBINDEX, TROPONINI in the last 168 hours. BNP: Invalid input(s): POCBNP CBG:  Recent Labs Lab 05/21/16 0032 05/21/16 0424 05/21/16 0722 05/21/16 1142 05/21/16 1652  GLUCAP 128* 128* 116* 103* 107*   D-Dimer No results for input(s): DDIMER in the last 72 hours. Hgb A1c  Recent Labs  05/19/16 0740  HGBA1C 5.7*    Lipid Profile No results for input(s): CHOL, HDL, LDLCALC, TRIG, CHOLHDL, LDLDIRECT in the last 72 hours. Thyroid function studies  Recent Labs  05/19/16 0740  TSH 1.491   Anemia work up No results for input(s): VITAMINB12, FOLATE, FERRITIN, TIBC, IRON, RETICCTPCT in the last 72 hours. Microbiology Recent Results (from the past 240 hour(s))  MRSA PCR Screening     Status: None   Collection Time: 05/18/16 10:50 PM  Result Value Ref Range Status   MRSA by PCR NEGATIVE NEGATIVE Final    Comment:        The GeneXpert MRSA Assay (FDA approved for NASAL specimens only), is one component of a comprehensive MRSA colonization surveillance program. It is not intended to diagnose MRSA infection nor to guide or monitor treatment for MRSA infections.   Gastrointestinal Panel by PCR , Stool     Status: None   Collection Time: 05/19/16 12:15 AM  Result Value Ref Range Status   Campylobacter species NOT DETECTED NOT DETECTED Final   Plesimonas shigelloides NOT DETECTED NOT DETECTED Final   Salmonella species NOT DETECTED NOT DETECTED Final   Yersinia enterocolitica NOT DETECTED NOT DETECTED Final   Vibrio species NOT DETECTED NOT DETECTED Final   Vibrio cholerae NOT DETECTED NOT DETECTED Final   Enteroaggregative E coli (EAEC) NOT DETECTED NOT DETECTED Final   Enteropathogenic E coli (EPEC) NOT DETECTED NOT DETECTED Final   Enterotoxigenic E coli (ETEC) NOT DETECTED NOT DETECTED Final   Shiga like toxin producing E coli (STEC) NOT DETECTED NOT DETECTED Final   Shigella/Enteroinvasive E coli (EIEC) NOT DETECTED NOT DETECTED Final   Cryptosporidium NOT DETECTED NOT DETECTED Final   Cyclospora cayetanensis NOT DETECTED NOT DETECTED Final   Entamoeba histolytica NOT DETECTED NOT DETECTED Final   Giardia lamblia NOT DETECTED NOT DETECTED Final   Adenovirus F40/41 NOT DETECTED NOT DETECTED Final   Astrovirus NOT DETECTED NOT DETECTED Final   Norovirus GI/GII NOT DETECTED NOT DETECTED  Final   Rotavirus  A NOT DETECTED NOT DETECTED Final   Sapovirus (I, II, IV, and V) NOT DETECTED NOT DETECTED Final  C difficile quick scan w PCR reflex     Status: None   Collection Time: 05/19/16 12:15 AM  Result Value Ref Range Status   C Diff antigen NEGATIVE NEGATIVE Final   C Diff toxin NEGATIVE NEGATIVE Final   C Diff interpretation No C. difficile detected.  Final      Studies:  No results found.  Assessment: 56 y.o.   1. Severe diarrhea secondary to exacerbation of ulcerative colitis, food poisoning, versus chemotherapy 2. Dehydration and hyponatremia, much improved 3. Metabolic encephalopathy, resolved 4. Thrombocytopenia, secondary to chemotherapy 5. Anemia, secondary to chemotherapy 6. Type 2 diabetes 7. Stage II a breast cancer, currently on neoadjuvant chemotherapy   Plan:  -I appreciate the excellent care from the family medicine and GI service -we discussed it may take her some time to recover well before we can restart second cycle chemo -I may change her chemotherapy regiment slightly to avoid severe diarrhea -Continue supportive care, consider blood transfusion if hemoglobin less than 8, and or plt transfusion if plt <10K or active bleeding -I will f/u    Truitt Merle, MD 05/21/2016  6:26 PM

## 2016-05-21 NOTE — Care Management Note (Signed)
Case Management Note  Patient Details  Name: Brandi Dickson MRN: [id number] Date of Birth: 13-May-1960  Subjective/Objective: 56 y/o f admitted w/Ulcerative colitis. Hx: Breast Ca.From home.GI following.                   Action/Plan:d/c plan home.   Expected Discharge Date:   (unknown)               Expected Discharge Plan:  Home/Self Care  In-House Referral:     Discharge planning Services  CM Consult  Post Acute Care Choice:    Choice offered to:     DME Arranged:    DME Agency:     HH Arranged:    HH Agency:     Status of Service:  In process, will continue to follow  If discussed at Long Length of Stay Meetings, dates discussed:    Additional Comments:  Dessa Phi, RN 05/21/2016, 2:55 PM

## 2016-05-22 LAB — BASIC METABOLIC PANEL
Anion gap: 7 (ref 5–15)
BUN: 5 mg/dL — AB (ref 6–20)
CALCIUM: 8.2 mg/dL — AB (ref 8.9–10.3)
CO2: 25 mmol/L (ref 22–32)
CREATININE: 0.73 mg/dL (ref 0.44–1.00)
Chloride: 105 mmol/L (ref 101–111)
GFR calc non Af Amer: >60 mL/min (ref >60)
Glucose, Bld: 138 mg/dL — ABNORMAL HIGH (ref 65–99)
Potassium: 4 mmol/L (ref 3.5–5.1)
Sodium: 137 mmol/L (ref 135–145)

## 2016-05-22 LAB — GLUCOSE, CAPILLARY
GLUCOSE-CAPILLARY: 153 mg/dL — AB (ref 65–99)
GLUCOSE-CAPILLARY: 106 mg/dL — AB (ref 65–99)
GLUCOSE-CAPILLARY: 129 mg/dL — AB (ref 65–99)
GLUCOSE-CAPILLARY: 166 mg/dL — AB (ref 65–99)
Glucose-Capillary: 130 mg/dL — ABNORMAL HIGH (ref 65–99)
Glucose-Capillary: 156 mg/dL — ABNORMAL HIGH (ref 65–99)
Glucose-Capillary: 215 mg/dL — ABNORMAL HIGH (ref 65–99)

## 2016-05-22 LAB — PREPARE PLATELET PHERESIS: Unit division: 0

## 2016-05-22 LAB — CBC WITH DIFFERENTIAL/PLATELET
BASOS PCT: 0 %
Basophils Absolute: 0 10*3/uL (ref 0.0–0.1)
Eosinophils Absolute: 0 10*3/uL (ref 0.0–0.7)
Eosinophils Relative: 0 %
HEMATOCRIT: 29.7 % — AB (ref 36.0–46.0)
Hemoglobin: 9.9 g/dL — ABNORMAL LOW (ref 12.0–15.0)
Lymphocytes Relative: 17 %
Lymphs Abs: 0.9 10*3/uL (ref 0.7–4.0)
MCH: 28.4 pg (ref 26.0–34.0)
MCHC: 33.3 g/dL (ref 30.0–36.0)
MCV: 85.3 fL (ref 78.0–100.0)
MONOS PCT: 7 %
Monocytes Absolute: 0.4 10*3/uL (ref 0.1–1.0)
Neutro Abs: 4.1 10*3/uL (ref 1.7–7.7)
Neutrophils Relative %: 76 %
Platelets: 82 10*3/uL — ABNORMAL LOW (ref 150–400)
RBC: 3.48 MIL/uL — ABNORMAL LOW (ref 3.87–5.11)
RDW: 13.8 % (ref 11.5–15.5)
WBC: 5.4 10*3/uL (ref 4.0–10.5)

## 2016-05-22 LAB — BPAM PLATELET PHERESIS
BLOOD PRODUCT EXPIRATION DATE: 201804231513
ISSUE DATE / TIME: 201804221707
UNIT TYPE AND RH: 6200

## 2016-05-22 MED ORDER — SALINE SPRAY 0.65 % NA SOLN
1.0000 | NASAL | Status: DC | PRN
  Administered 2016-05-22: 1 via NASAL
  Filled 2016-05-22: qty 44

## 2016-05-22 NOTE — Progress Notes (Signed)
     [facility] Progress Note  CC:  Ulcerative colitis  Subjective:  Not much change from yesterday.  Had 2 BM's that were "nothing but blood" this AM.  Says that it was very dark blood.  Objective:  Vital signs in last 24 hours: Temp:  [98 F (36.7 C)-98.4 F (36.9 C)] 98.4 F (36.9 C) (04/24 0504) Pulse Rate:  [76-83] 78 (04/24 0504) Resp:  [16-20] 16 (04/24 0504) BP: (128-161)/(63-95) 128/95 (04/24 0504) SpO2:  [98 %-100 %] 100 % (04/24 0504) Last BM Date: 05/21/16 General:  Alert, Well-developed, in NAD Heart:  Regular rate and rhythm; no murmurs Pulm:  CTAB.  No increased WOB. Abdomen:  Soft, non-distended.  BS present.  Minimal LLQ TTP.  Extremities:  Without edema. Neurologic:  Alert and oriented x 4;  grossly normal neurologically. Psych:  Alert and cooperative. Normal mood and affect.  Intake/Output from previous day: 04/23 0701 - 04/24 0700 In: 1405 [P.O.:720; I.V.:685] Out: -   Lab Results:  Recent Labs  05/20/16 0800 05/20/16 1800 05/22/16 0542  WBC 7.0 6.9 5.4  HGB 10.1* 10.5* 9.9*  HCT 30.2* 31.5* 29.7*  PLT 58* 64* 82*   BMET  Recent Labs  05/20/16 0420 05/21/16 0430 05/22/16 0542  NA 134* 136 137  K 3.2* 3.7 4.0  CL 103 104 105  CO2 25 24 25   GLUCOSE 102* 139* 138*  BUN 10 <5* 5*  CREATININE 0.77 0.70 0.73  CALCIUM 7.7* 7.9* 8.2*   Assessment / Plan: 1. Ulcerative Colitis, left sided:  GI pathogen panel negative, likely has an acute flare of UC possibly triggered by food poisoning and in combination with immunosuppressive effect of her first chemo.  On oral mesalamine, only 2.4 grams daily--increased to 4.8 grams on 4/23.  Mesalamine enemas ordered but she cannot retain them.  Prednisone 30 mg started 4/22--changed to solumedrol 60 mg on 4/24.  Continue Bentyl for cramping.  Continue full liquid diet for now. 2.  Left breast cancer 3. Thrombocytopenia:  Improving. 4.  Hyponatremia:  Resolved.   LOS: 4 days   ZEHR,  JESSICA D.  05/22/2016, 9:09 AM  Pager number [place] Attending   I have taken an interval history, reviewed the chart and examined the patient. I agree with the Advanced Practitioner's note, impression and recommendations.   Maybe a little better tonight. Rusty stool in commode. I think she could go home tomorrow - Prednisone taper and f/u outpatient GI MD  Soft diet  Gatha Mayer, MD, [facility] [phone] (pager) [phone] after 5 PM, weekends and holidays  05/22/2016 5:40 PM

## 2016-05-22 NOTE — Progress Notes (Signed)
PROGRESS NOTE    Brandi Dickson  NKN:[id number] DOB: July 17, 1960 DOA: 05/18/2016 PCP: Orpah Melter, MD    Brief Narrative: 56 y.o.femalewith medical history significant of 68 diagnosed breast cancer, DM 2 asthma, HTN, neuropathy secondary to diabetes, ulcerative colitis. Presented with vomiting and diarrhea. Found to have rectal bleeding and thrombocytopenia.   Subjective: Patient states she was able to sleep through the night for the first time and her pain has been well controlled. This morning, patient had a large bloody bowel movement with both bright red and dark blood and had painful abdominal cramps with it. Denies nausea, vomiting, chest pain, shortness of breath. States she has a known history of internal hemorrhoids. Her nose is still bleeding despite using a saline rinse.   Assessment & Plan:   Active Problems:   Breast cancer of upper-outer quadrant of left female breast (HCC)   Hyponatremia   Hypokalemia   Hypomagnesemia   Dehydration   Blood in stool   Thrombocytopenia (HCC)   GI bleeding   Ulcerative rectosigmoiditis with rectal bleeding (HCC)   Hematochezia   Generalized abdominal pain   Nausea vomiting and diarrhea   Diarrhea of presumed infectious origin  GI bleeding/hematochezia with diarrheasecondary to exacerbation of ulcerative colitis -In the setting of thrombocytopenia with recent chemotherapy -C Diff PCR negative, GI pathogen panel negative -GI consulted. Patient unable to retain mesalamine enema. Therefore, oral mesalamine dose doubled from 2.4 to 4.8 grams daily.  -Prednisone 30 mg started on 4/22 and changed to solumedrol 60 mg by GI. -UC flare triggered by food poisoning/VGE  - Continue full liquid diet, Protonix, and Bentyl for cramping - Hgb 9.9 today, continue to monitor CBC  Thrombocytopenia - improved  -Likely secondary to chemotherapy received  -s/p 2 pack of platelets during hospital stay   - Platelets trending up 49 >> 58 >> 64  >> 82 -Oncologist recommending to keep platelets >50K -Monitor CBC   Hyponatremia - Resolved  -Likely secondary to GI losses and dehydration -Sodium was 125 upon admission. 137 today -d/c IVF   Hypomagnesemia/Hypokalemia  -Replaced/resolved  Diabetes mellitus, type II with neuropathy -Hold metformin -Placed on ISS and CBG monitoring -Continue gabapentin for neuropathy  Breast cancer of the upper-outer quadrant of the left breast -Stage IIA -Followed by [name] -Recently received chemotherapy, Docetaxel, carboplatin, herceptin and pejeta    DVT prophylaxis: SCDs Code Status: Full Family Communication: None Disposition Plan: Home  Consultants:   GI - [place]  Procedures:   None  Antimicrobials:  None    Objective: Vitals:   05/21/16 1030 05/21/16 1415 05/21/16 2059 05/22/16 0504  BP: (!) 161/81 130/86 139/63 (!) 128/95  Pulse: 83 76 79 78  Resp: 20 20 18 16   Temp: 98.1 F (36.7 C) 98 F (36.7 C) 98.4 F (36.9 C) 98.4 F (36.9 C)  TempSrc: Oral Oral Oral Oral  SpO2: 100% 100% 98% 100%  Weight:      Height:        Intake/Output Summary (Last 24 hours) at 05/22/16 1423 Last data filed at 05/22/16 1315  Gross per 24 hour  Intake              810 ml  Output                2 ml  Net              808 ml   Filed Weights   05/18/16 1446  Weight: 96.6 kg (213 lb)  Examination:  General exam: Appears calm and comfortable  Respiratory system: Clear to auscultation. Respiratory effort normal. Cardiovascular system: S1 & S2 heard, RRR. No JVD, murmurs, rubs, gallops or clicks. No pedal edema. Gastrointestinal system: Abdomen is nondistended, soft, mild LLQ TTP. Normal bowel sounds heard. Central nervous system: Alert and oriented. Extremities: Symmetric 5 x 5 power. Skin: No rashes, lesions or ulcers Psychiatry: Judgement and insight appear normal. Mood & affect appropriate.   Data Reviewed: I have personally reviewed following labs and imaging  studies  CBC:  Recent Labs Lab 05/18/16 1238  05/18/16 1653 05/19/16 0740 05/19/16 1807 05/20/16 0420 05/20/16 0800 05/20/16 1800 05/22/16 0542  WBC 2.4*  < > 2.5* 4.9 6.7 6.8 7.0 6.9 5.4  NEUTROABS 1.4*  --  1.2* 2.8  --   --   --   --  4.1  HGB 12.7  < > 11.1* 11.0* 10.4* 9.6* 10.1* 10.5* 9.9*  HCT 38.3  < > 32.4* 32.3* 30.1* 28.6* 30.2* 31.5* 29.7*  MCV 85.3  < > 80.8 82.8 83.6 84.9 85.1 85.6 85.3  PLT 54*  < > 36* 52* 53* 49* 58* 64* 82*  < > = values in this interval not displayed. Basic Metabolic Panel:  Recent Labs Lab 05/18/16 1653 05/19/16 0740 05/20/16 0420 05/21/16 0430 05/22/16 0542  NA 125* 130*  130* 134* 136 137  K 3.2* 4.0  4.0 3.2* 3.7 4.0  CL 92* 98*  98* 103 104 105  CO2 24 23  23 25 24 25   GLUCOSE 126* 101*  102* 102* 139* 138*  BUN 19 19  19 10  <5* 5*  CREATININE 1.00 1.00  0.99 0.77 0.70 0.73  CALCIUM 7.9* 8.2*  8.2* 7.7* 7.9* 8.2*  MG 1.4* 2.4 2.1 2.1  --   PHOS 2.8 2.6  --   --   --    GFR: Estimated Creatinine Clearance: 87.6 mL/min (by C-G formula based on SCr of 0.73 mg/dL). Liver Function Tests:  Recent Labs Lab 05/18/16 1238 05/19/16 0740  AST 23 35  ALT 19 26  ALKPHOS 85 72  BILITOT 1.21* 0.8  PROT 6.7 5.6*  ALBUMIN 2.7* 2.4*   No results for input(s): LIPASE, AMYLASE in the last 168 hours. No results for input(s): AMMONIA in the last 168 hours. Coagulation Profile: No results for input(s): INR, PROTIME in the last 168 hours. Cardiac Enzymes: No results for input(s): CKTOTAL, CKMB, CKMBINDEX, TROPONINI in the last 168 hours. BNP (last 3 results) No results for input(s): PROBNP in the last 8760 hours. HbA1C: No results for input(s): HGBA1C in the last 72 hours. CBG:  Recent Labs Lab 05/21/16 2057 05/22/16 0006 05/22/16 0504 05/22/16 0808 05/22/16 1238  GLUCAP 142* 156* 153* 106* 215*   Lipid Profile: No results for input(s): CHOL, HDL, LDLCALC, TRIG, CHOLHDL, LDLDIRECT in the last 72 hours. Thyroid  Function Tests: No results for input(s): TSH, T4TOTAL, FREET4, T3FREE, THYROIDAB in the last 72 hours. Anemia Panel: No results for input(s): VITAMINB12, FOLATE, FERRITIN, TIBC, IRON, RETICCTPCT in the last 72 hours. Sepsis Labs:  Recent Labs Lab 05/18/16 1704  LATICACIDVEN 1.72    Recent Results (from the past 240 hour(s))  MRSA PCR Screening     Status: None   Collection Time: 05/18/16 10:50 PM  Result Value Ref Range Status   MRSA by PCR NEGATIVE NEGATIVE Final    Comment:        The GeneXpert MRSA Assay (FDA approved for NASAL specimens only), is one component of  a comprehensive MRSA colonization surveillance program. It is not intended to diagnose MRSA infection nor to guide or monitor treatment for MRSA infections.   Gastrointestinal Panel by PCR , Stool     Status: None   Collection Time: 05/19/16 12:15 AM  Result Value Ref Range Status   Campylobacter species NOT DETECTED NOT DETECTED Final   Plesimonas shigelloides NOT DETECTED NOT DETECTED Final   Salmonella species NOT DETECTED NOT DETECTED Final   Yersinia enterocolitica NOT DETECTED NOT DETECTED Final   Vibrio species NOT DETECTED NOT DETECTED Final   Vibrio cholerae NOT DETECTED NOT DETECTED Final   Enteroaggregative E coli (EAEC) NOT DETECTED NOT DETECTED Final   Enteropathogenic E coli (EPEC) NOT DETECTED NOT DETECTED Final   Enterotoxigenic E coli (ETEC) NOT DETECTED NOT DETECTED Final   Shiga like toxin producing E coli (STEC) NOT DETECTED NOT DETECTED Final   Shigella/Enteroinvasive E coli (EIEC) NOT DETECTED NOT DETECTED Final   Cryptosporidium NOT DETECTED NOT DETECTED Final   Cyclospora cayetanensis NOT DETECTED NOT DETECTED Final   Entamoeba histolytica NOT DETECTED NOT DETECTED Final   Giardia lamblia NOT DETECTED NOT DETECTED Final   Adenovirus F40/41 NOT DETECTED NOT DETECTED Final   Astrovirus NOT DETECTED NOT DETECTED Final   Norovirus GI/GII NOT DETECTED NOT DETECTED Final   Rotavirus A  NOT DETECTED NOT DETECTED Final   Sapovirus (I, II, IV, and V) NOT DETECTED NOT DETECTED Final  C difficile quick scan w PCR reflex     Status: None   Collection Time: 05/19/16 12:15 AM  Result Value Ref Range Status   C Diff antigen NEGATIVE NEGATIVE Final   C Diff toxin NEGATIVE NEGATIVE Final   C Diff interpretation No C. difficile detected.  Final         Radiology Studies: No results found.      Scheduled Meds: . dicyclomine  20 mg Oral TID AC & HS  . feeding supplement  1 Container Oral TID BM  . gabapentin  200 mg Oral QHS  . insulin aspart  0-9 Units Subcutaneous Q4H  . Mesalamine  1,600 mg Oral TID  . methylPREDNISolone (SOLU-MEDROL) injection  60 mg Intravenous Daily  . pantoprazole (PROTONIX) IV  40 mg Intravenous Q12H  . sodium chloride flush  3 mL Intravenous Q12H   Continuous Infusions:   LOS: 4 days    Time spent: 30 minutes    Donald Siva, PA-Student    Attending MD note  Patient was seen, examined,treatment plan was discussed with the PA-S [facility].  I have personally reviewed the clinical findings, lab, imaging studies and management of this patient in detail. I agree with the documentation, as recorded by the PA-S and changes to above note were in bold green  Patient is A 56 year old female which was diagnosed with breast cancer, diabetes, asthma, hypertension, urosepsis colitis presented with vomiting and diarrhea was found to have rectal bleeding and thrombocytopenia. Patient is being treated for flare of ulcerative colitis due to pleural gastroenteritis.  On Exam: Gen. exam: Awake, alert, not in any distress Chest: Good air entry bilaterally, no rhonchi or rales CVS: S1-S2 regular, no murmurs Abdomen: Soft, nontender and nondistended Neurology: Non-focal Skin: No rash or lesions  Plan Ulcerative colitis flare triggered by food poisoning/vital gastroenteritis Sensation to Solu-Medrol overnight by GI, also mesalamine dose was  doubled. Patient will full liquid diet, receiving Bentyl and Protonix GI recommendation appreciated, if continued to improve probably may d/c on prednisone taper.  Hyponatremia -  resolved  Thrombocytopenia secondary to chemotherapy - improving Status post 2 pack of platelets  Type 2 diabetes - stable Continue current management  Rest as above  Chipper Oman, MD   If 7PM-7AM, please contact night-coverage [email or web address] Password [facility] 05/22/2016, 2:23 PM

## 2016-05-23 ENCOUNTER — Other Ambulatory Visit: Payer: Self-pay | Admitting: Hematology

## 2016-05-23 LAB — CBC
HEMATOCRIT: 25.5 % — AB (ref 36.0–46.0)
HEMOGLOBIN: 8.4 g/dL — AB (ref 12.0–15.0)
MCH: 27.7 pg (ref 26.0–34.0)
MCHC: 32.9 g/dL (ref 30.0–36.0)
MCV: 84.2 fL (ref 78.0–100.0)
Platelets: 100 10*3/uL — ABNORMAL LOW (ref 150–400)
RBC: 3.03 MIL/uL — ABNORMAL LOW (ref 3.87–5.11)
RDW: 13.8 % (ref 11.5–15.5)
WBC: 6.1 10*3/uL (ref 4.0–10.5)

## 2016-05-23 LAB — COMPREHENSIVE METABOLIC PANEL
ALBUMIN: 1.9 g/dL — AB (ref 3.5–5.0)
ALT: 15 U/L (ref 14–54)
AST: 12 U/L — AB (ref 15–41)
Alkaline Phosphatase: 46 U/L (ref 38–126)
Anion gap: 6 (ref 5–15)
BILIRUBIN TOTAL: 0.2 mg/dL — AB (ref 0.3–1.2)
BUN: 8 mg/dL (ref 6–20)
CO2: 21 mmol/L — ABNORMAL LOW (ref 22–32)
Calcium: 6.3 mg/dL — CL (ref 8.9–10.3)
Chloride: 115 mmol/L — ABNORMAL HIGH (ref 101–111)
Creatinine, Ser: 0.53 mg/dL (ref 0.44–1.00)
GFR calc Af Amer: >60 mL/min (ref >60)
GLUCOSE: 65 mg/dL (ref 65–99)
Potassium: 2.6 mmol/L — CL (ref 3.5–5.1)
Sodium: 142 mmol/L (ref 135–145)
TOTAL PROTEIN: 4.5 g/dL — AB (ref 6.5–8.1)

## 2016-05-23 LAB — GLUCOSE, CAPILLARY
GLUCOSE-CAPILLARY: 155 mg/dL — AB (ref 65–99)
GLUCOSE-CAPILLARY: 172 mg/dL — AB (ref 65–99)
GLUCOSE-CAPILLARY: 171 mg/dL — AB (ref 65–99)
Glucose-Capillary: 85 mg/dL (ref 65–99)
Glucose-Capillary: 179 mg/dL — ABNORMAL HIGH (ref 65–99)

## 2016-05-23 LAB — MAGNESIUM: Magnesium: 1.8 mg/dL (ref 1.7–2.4)

## 2016-05-23 MED ORDER — SODIUM CHLORIDE 0.9 % IV SOLN
Freq: Once | INTRAVENOUS | Status: AC
  Administered 2016-05-23: 10:00:00 via INTRAVENOUS
  Filled 2016-05-23: qty 1000

## 2016-05-23 MED ORDER — MAGNESIUM SULFATE 2 GM/50ML IV SOLN
2.0000 g | Freq: Once | INTRAVENOUS | Status: AC
  Administered 2016-05-23: 2 g via INTRAVENOUS
  Filled 2016-05-23: qty 50

## 2016-05-23 NOTE — Progress Notes (Signed)
PROGRESS NOTE    Brandi Dickson  BTD:[id number] DOB: Jun 14, 1960 DOA: 05/18/2016 PCP: Orpah Melter, MD    Brief Narrative: 56 y.o.femalewith medical history significant of 68 diagnosed breast cancer, DM 2 asthma, HTN, neuropathy secondary to diabetes, ulcerative colitis. Presented with vomiting and diarrhea. Found to have rectal bleeding and thrombocytopenia. Been treated for UC flare and followed by GI, now with severe hypokalemia.   Subjective: Patient seen and examined, she is feeling better, diarrhea has improved, less bleeding as well. No acute events overnight. K low this AM   Assessment & Plan: Ulcerative colitis flare triggered by food poisoning/viral gastroenteritis -In the setting of thrombocytopenia with recent chemotherapy -C Diff PCR negative, GI pathogen panel negative -GI consulted. Patient unable to retain mesalamine enema. Therefore, oral mesalamine dose doubled from 2.4 to 4.8 grams daily.  -Prednisone 30 mg started on 4/22 and changed to solumedrol 60 mg by GI. - switch to Prednisone 40 in AM and d/c with plan for taper over 2 weeks  -GI signed off  -Protonix, and Bentyl for cramping   Anemia acute blood loss and bone marrow suppression  -Asymptomatic  -Hgb slight down today -Transfuse if Hgb < 8  Thrombocytopenia - improved  -Likely secondary to chemotherapy received  -s/p 2 pack of platelets during hospital stay   -Platelets trending up 49 >> 58 >> 64 >> 82>>100 -Oncologist recommending to keep platelets >50K -Monitor CBC   Hyponatremia - Resolved  -Likely secondary to GI losses and dehydration -Treated with IVF   Hypokalemia - 2.6 -Replete -Mag 1.8 although will give 1 dose given significant low K+ -Check BMP in AM   Hypocalcemia - Corrected Ca+ 8. New, unclear etiology -Asymptomatic  -Ionized calcium pending   -Check Vit D  -Monitor   Diabetes mellitus, type II with neuropathy -Holding metformin -Placed on ISS and CBG  monitoring -Continue gabapentin for neuropathy  Breast cancer of the upper-outer quadrant of the left breast -Stage IIA -Followed by [name] -Recently received chemotherapy, Docetaxel, carboplatin, herceptin and pejeta   DVT prophylaxis: SCDs Code Status: Full Family Communication: None Disposition Plan: If all labs stable can be d/c home in AM   Consultants:   GI - [place]  Procedures:   None  Antimicrobials:  None    Objective: Vitals:   05/22/16 1453 05/22/16 2029 05/23/16 0437 05/23/16 1327  BP: 140/73 (!) 136/57 134/73 (!) 150/86  Pulse: 99 73 66 83  Resp: 17 18 18 20   Temp: 98.6 F (37 C) 98.4 F (36.9 C) 98.6 F (37 C) 98.7 F (37.1 C)  TempSrc: Oral Oral Oral Oral  SpO2: 99% 100% 99% 100%  Weight:      Height:        Intake/Output Summary (Last 24 hours) at 05/23/16 1628 Last data filed at 05/23/16 1328  Gross per 24 hour  Intake              720 ml  Output                4 ml  Net              716 ml   Filed Weights   05/18/16 1446  Weight: 96.6 kg (213 lb)    Examination:  General exam: NAD Respiratory system: Clear to auscultation Cardiovascular system: S1-S2 regular rate and rhythm Gastrointestinal system: Abdomen soft nontender nondistended Central nervous system: Alert and oriented 3 Extremities: No lower extremity edema  Data Reviewed: I have personally reviewed  following labs and imaging studies  CBC:  Recent Labs Lab 05/18/16 1238  05/18/16 1653 05/19/16 0740  05/20/16 0420 05/20/16 0800 05/20/16 1800 05/22/16 0542 05/23/16 0545  WBC 2.4*  < > 2.5* 4.9  < > 6.8 7.0 6.9 5.4 6.1  NEUTROABS 1.4*  --  1.2* 2.8  --   --   --   --  4.1  --   HGB 12.7  < > 11.1* 11.0*  < > 9.6* 10.1* 10.5* 9.9* 8.4*  HCT 38.3  < > 32.4* 32.3*  < > 28.6* 30.2* 31.5* 29.7* 25.5*  MCV 85.3  < > 80.8 82.8  < > 84.9 85.1 85.6 85.3 84.2  PLT 54*  < > 36* 52*  < > 49* 58* 64* 82* 100*  < > = values in this interval not displayed. Basic  Metabolic Panel:  Recent Labs Lab 05/18/16 1653 05/19/16 0740 05/20/16 0420 05/21/16 0430 05/22/16 0542 05/23/16 0545 05/23/16 1038  NA 125* 130*  130* 134* 136 137 142  --   K 3.2* 4.0  4.0 3.2* 3.7 4.0 2.6*  --   CL 92* 98*  98* 103 104 105 115*  --   CO2 24 23  23 25 24 25  21*  --   GLUCOSE 126* 101*  102* 102* 139* 138* 65  --   BUN 19 19  19 10  <5* 5* 8  --   CREATININE 1.00 1.00  0.99 0.77 0.70 0.73 0.53  --   CALCIUM 7.9* 8.2*  8.2* 7.7* 7.9* 8.2* 6.3*  --   MG 1.4* 2.4 2.1 2.1  --   --  1.8  PHOS 2.8 2.6  --   --   --   --   --    GFR: Estimated Creatinine Clearance: 87.6 mL/min (by C-G formula based on SCr of 0.53 mg/dL). Liver Function Tests:  Recent Labs Lab 05/18/16 1238 05/19/16 0740 05/23/16 0545  AST 23 35 12*  ALT 19 26 15   ALKPHOS 85 72 46  BILITOT 1.21* 0.8 0.2*  PROT 6.7 5.6* 4.5*  ALBUMIN 2.7* 2.4* 1.9*   No results for input(s): LIPASE, AMYLASE in the last 168 hours. No results for input(s): AMMONIA in the last 168 hours. Coagulation Profile: No results for input(s): INR, PROTIME in the last 168 hours. Cardiac Enzymes: No results for input(s): CKTOTAL, CKMB, CKMBINDEX, TROPONINI in the last 168 hours. BNP (last 3 results) No results for input(s): PROBNP in the last 8760 hours. HbA1C: No results for input(s): HGBA1C in the last 72 hours. CBG:  Recent Labs Lab 05/22/16 2026 05/22/16 2355 05/23/16 0430 05/23/16 0740 05/23/16 1208  GLUCAP 166* 130* 155* 85 179*   Lipid Profile: No results for input(s): CHOL, HDL, LDLCALC, TRIG, CHOLHDL, LDLDIRECT in the last 72 hours. Thyroid Function Tests: No results for input(s): TSH, T4TOTAL, FREET4, T3FREE, THYROIDAB in the last 72 hours. Anemia Panel: No results for input(s): VITAMINB12, FOLATE, FERRITIN, TIBC, IRON, RETICCTPCT in the last 72 hours. Sepsis Labs:  Recent Labs Lab 05/18/16 1704  LATICACIDVEN 1.72    Recent Results (from the past 240 hour(s))  MRSA PCR Screening      Status: None   Collection Time: 05/18/16 10:50 PM  Result Value Ref Range Status   MRSA by PCR NEGATIVE NEGATIVE Final    Comment:        The GeneXpert MRSA Assay (FDA approved for NASAL specimens only), is one component of a comprehensive MRSA colonization surveillance program. It is not intended  to diagnose MRSA infection nor to guide or monitor treatment for MRSA infections.   Gastrointestinal Panel by PCR , Stool     Status: None   Collection Time: 05/19/16 12:15 AM  Result Value Ref Range Status   Campylobacter species NOT DETECTED NOT DETECTED Final   Plesimonas shigelloides NOT DETECTED NOT DETECTED Final   Salmonella species NOT DETECTED NOT DETECTED Final   Yersinia enterocolitica NOT DETECTED NOT DETECTED Final   Vibrio species NOT DETECTED NOT DETECTED Final   Vibrio cholerae NOT DETECTED NOT DETECTED Final   Enteroaggregative E coli (EAEC) NOT DETECTED NOT DETECTED Final   Enteropathogenic E coli (EPEC) NOT DETECTED NOT DETECTED Final   Enterotoxigenic E coli (ETEC) NOT DETECTED NOT DETECTED Final   Shiga like toxin producing E coli (STEC) NOT DETECTED NOT DETECTED Final   Shigella/Enteroinvasive E coli (EIEC) NOT DETECTED NOT DETECTED Final   Cryptosporidium NOT DETECTED NOT DETECTED Final   Cyclospora cayetanensis NOT DETECTED NOT DETECTED Final   Entamoeba histolytica NOT DETECTED NOT DETECTED Final   Giardia lamblia NOT DETECTED NOT DETECTED Final   Adenovirus F40/41 NOT DETECTED NOT DETECTED Final   Astrovirus NOT DETECTED NOT DETECTED Final   Norovirus GI/GII NOT DETECTED NOT DETECTED Final   Rotavirus A NOT DETECTED NOT DETECTED Final   Sapovirus (I, II, IV, and V) NOT DETECTED NOT DETECTED Final  C difficile quick scan w PCR reflex     Status: None   Collection Time: 05/19/16 12:15 AM  Result Value Ref Range Status   C Diff antigen NEGATIVE NEGATIVE Final   C Diff toxin NEGATIVE NEGATIVE Final   C Diff interpretation No C. difficile detected.  Final          Radiology Studies: No results found.      Scheduled Meds: . dicyclomine  20 mg Oral TID AC & HS  . feeding supplement  1 Container Oral TID BM  . gabapentin  200 mg Oral QHS  . insulin aspart  0-9 Units Subcutaneous Q4H  . Mesalamine  1,600 mg Oral TID  . methylPREDNISolone (SOLU-MEDROL) injection  60 mg Intravenous Daily  . pantoprazole (PROTONIX) IV  40 mg Intravenous Q12H  . sodium chloride flush  3 mL Intravenous Q12H   Continuous Infusions:   LOS: 5 days    Time spent: 30 minutes  Chipper Oman, MD   If 7PM-7AM, please contact night-coverage [email or web address] Password [facility] 05/23/2016, 4:28 PM

## 2016-05-23 NOTE — Progress Notes (Signed)
CRITICAL VALUE ALERT  Critical value received:  K+ 2.6  Date of notification:  05/23/16  Time of notification:  0655  Critical value read back:Yes.    Nurse who received alert:  Leona Carry  MD notified (1st page):  Baltazar Najjar, NP  Time of first page:  [phone]

## 2016-05-23 NOTE — Progress Notes (Signed)
Brandi Dickson   DOB:22-Aug-1960   GH#:[id number]   CVE#:[id number]  Oncology follow up  Subjective: Brandi Dickson feels better overall, less diarrhea and hematochezia. She is eating well. Afebrile, vital signs stable. She may go home tomorrow morning.    Objective:  Vitals:   05/23/16 1327 05/23/16 2015  BP: (!) 150/86 (!) 145/81  Pulse: 83 76  Resp: 20 18  Temp: 98.7 F (37.1 C) 98.5 F (36.9 C)    Body mass index is 36.56 kg/m.  Intake/Output Summary (Last 24 hours) at 05/23/16 2136 Last data filed at 05/23/16 1328  Gross per 24 hour  Intake              720 ml  Output                0 ml  Net              720 ml     Sclerae unicteric  Oropharynx clear  No peripheral adenopathy  Lungs clear -- no rales or rhonchi  Heart regular rate and rhythm  Abdomen benign  MSK no focal spinal tenderness, no peripheral edema  Neuro nonfocal    CBG (last 3)   Recent Labs  05/23/16 1208 05/23/16 1712 05/23/16 2011  GLUCAP 179* 172* 171*     Labs:  Lab Results  Component Value Date   WBC 6.1 05/23/2016   HGB 8.4 (L) 05/23/2016   HCT 25.5 (L) 05/23/2016   MCV 84.2 05/23/2016   PLT 100 (L) 05/23/2016   NEUTROABS 4.1 05/22/2016   CMP Latest Ref Rng & Units 05/23/2016 05/22/2016 05/21/2016  Glucose 65 - 99 mg/dL 65 138(H) 139(H)  BUN 6 - 20 mg/dL 8 5(L) <5(L)  Creatinine 0.44 - 1.00 mg/dL 0.53 0.73 0.70  Sodium 135 - 145 mmol/L 142 137 136  Potassium 3.5 - 5.1 mmol/L 2.6(LL) 4.0 3.7  Chloride 101 - 111 mmol/L 115(H) 105 104  CO2 22 - 32 mmol/L 21(L) 25 24  Calcium 8.9 - 10.3 mg/dL 6.3(LL) 8.2(L) 7.9(L)  Total Protein 6.5 - 8.1 g/dL 4.5(L) - -  Total Bilirubin 0.3 - 1.2 mg/dL 0.2(L) - -  Alkaline Phos 38 - 126 U/L 46 - -  AST 15 - 41 U/L 12(L) - -  ALT 14 - 54 U/L 15 - -     Urine Studies No results for input(s): UHGB, CRYS in the last 72 hours.  Invalid input(s): UACOL, UAPR, USPG, UPH, UTP, UGL, UKET, UBIL, UNIT, UROB, [place], UEPI, UWBC, Junie Panning [place], [place],  [place]  Basic Metabolic Panel:  Recent Labs Lab 05/18/16 1653 05/19/16 0740 05/20/16 0420 05/21/16 0430 05/22/16 0542 05/23/16 0545 05/23/16 1038  NA 125* 130*  130* 134* 136 137 142  --   K 3.2* 4.0  4.0 3.2* 3.7 4.0 2.6*  --   CL 92* 98*  98* 103 104 105 115*  --   CO2 24 23  23 25 24 25  21*  --   GLUCOSE 126* 101*  102* 102* 139* 138* 65  --   BUN 19 19  19 10  <5* 5* 8  --   CREATININE 1.00 1.00  0.99 0.77 0.70 0.73 0.53  --   CALCIUM 7.9* 8.2*  8.2* 7.7* 7.9* 8.2* 6.3*  --   MG 1.4* 2.4 2.1 2.1  --   --  1.8  PHOS 2.8 2.6  --   --   --   --   --    GFR Estimated Creatinine  Clearance: 87.6 mL/min (by C-G formula based on SCr of 0.53 mg/dL). Liver Function Tests:  Recent Labs Lab 05/18/16 1238 05/19/16 0740 05/23/16 0545  AST 23 35 12*  ALT 19 26 15   ALKPHOS 85 72 46  BILITOT 1.21* 0.8 0.2*  PROT 6.7 5.6* 4.5*  ALBUMIN 2.7* 2.4* 1.9*   No results for input(s): LIPASE, AMYLASE in the last 168 hours. No results for input(s): AMMONIA in the last 168 hours. Coagulation profile No results for input(s): INR, PROTIME in the last 168 hours.  CBC:  Recent Labs Lab 05/18/16 1238  05/18/16 1653 05/19/16 0740  05/20/16 0420 05/20/16 0800 05/20/16 1800 05/22/16 0542 05/23/16 0545  WBC 2.4*  < > 2.5* 4.9  < > 6.8 7.0 6.9 5.4 6.1  NEUTROABS 1.4*  --  1.2* 2.8  --   --   --   --  4.1  --   HGB 12.7  < > 11.1* 11.0*  < > 9.6* 10.1* 10.5* 9.9* 8.4*  HCT 38.3  < > 32.4* 32.3*  < > 28.6* 30.2* 31.5* 29.7* 25.5*  MCV 85.3  < > 80.8 82.8  < > 84.9 85.1 85.6 85.3 84.2  PLT 54*  < > 36* 52*  < > 49* 58* 64* 82* 100*  < > = values in this interval not displayed. Cardiac Enzymes: No results for input(s): CKTOTAL, CKMB, CKMBINDEX, TROPONINI in the last 168 hours. BNP: Invalid input(s): POCBNP CBG:  Recent Labs Lab 05/23/16 0430 05/23/16 0740 05/23/16 1208 05/23/16 1712 05/23/16 2011  GLUCAP 155* 85 179* 172* 171*   D-Dimer No results for input(s): DDIMER  in the last 72 hours. Hgb A1c No results for input(s): HGBA1C in the last 72 hours. Lipid Profile No results for input(s): CHOL, HDL, LDLCALC, TRIG, CHOLHDL, LDLDIRECT in the last 72 hours. Thyroid function studies No results for input(s): TSH, T4TOTAL, T3FREE, THYROIDAB in the last 72 hours.  Invalid input(s): FREET3 Anemia work up No results for input(s): VITAMINB12, FOLATE, FERRITIN, TIBC, IRON, RETICCTPCT in the last 72 hours. Microbiology Recent Results (from the past 240 hour(s))  MRSA PCR Screening     Status: None   Collection Time: 05/18/16 10:50 PM  Result Value Ref Range Status   MRSA by PCR NEGATIVE NEGATIVE Final    Comment:        The GeneXpert MRSA Assay (FDA approved for NASAL specimens only), is one component of a comprehensive MRSA colonization surveillance program. It is not intended to diagnose MRSA infection nor to guide or monitor treatment for MRSA infections.   Gastrointestinal Panel by PCR , Stool     Status: None   Collection Time: 05/19/16 12:15 AM  Result Value Ref Range Status   Campylobacter species NOT DETECTED NOT DETECTED Final   Plesimonas shigelloides NOT DETECTED NOT DETECTED Final   Salmonella species NOT DETECTED NOT DETECTED Final   Yersinia enterocolitica NOT DETECTED NOT DETECTED Final   Vibrio species NOT DETECTED NOT DETECTED Final   Vibrio cholerae NOT DETECTED NOT DETECTED Final   Enteroaggregative E coli (EAEC) NOT DETECTED NOT DETECTED Final   Enteropathogenic E coli (EPEC) NOT DETECTED NOT DETECTED Final   Enterotoxigenic E coli (ETEC) NOT DETECTED NOT DETECTED Final   Shiga like toxin producing E coli (STEC) NOT DETECTED NOT DETECTED Final   Shigella/Enteroinvasive E coli (EIEC) NOT DETECTED NOT DETECTED Final   Cryptosporidium NOT DETECTED NOT DETECTED Final   Cyclospora cayetanensis NOT DETECTED NOT DETECTED Final   Entamoeba histolytica NOT DETECTED  NOT DETECTED Final   Giardia lamblia NOT DETECTED NOT DETECTED Final    Adenovirus F40/41 NOT DETECTED NOT DETECTED Final   Astrovirus NOT DETECTED NOT DETECTED Final   Norovirus GI/GII NOT DETECTED NOT DETECTED Final   Rotavirus A NOT DETECTED NOT DETECTED Final   Sapovirus (I, II, IV, and V) NOT DETECTED NOT DETECTED Final  C difficile quick scan w PCR reflex     Status: None   Collection Time: 05/19/16 12:15 AM  Result Value Ref Range Status   C Diff antigen NEGATIVE NEGATIVE Final   C Diff toxin NEGATIVE NEGATIVE Final   C Diff interpretation No C. difficile detected.  Final      Studies:  No results found.  Assessment: 56 y.o.   1. Severe diarrhea secondary to exacerbation of ulcerative colitis, food poisoning, versus chemotherapy 2. Dehydration and hyponatremia, much improved 3. Metabolic encephalopathy, resolved 4. Thrombocytopenia, secondary to chemotherapy, improving  5. Anemia, secondary to chemotherapy 6. Type 2 diabetes 7. Stage II a breast cancer, currently on neoadjuvant chemotherapy   Plan:  -she is doing much better overall, may go home tomorrow with tapering dose steroids per GI  -I will see her next week 5/3 and decide if we will proceed secondary cycle chemo on 5/4 as scheduled, or postpone it if she has not recovered completely  -I will change her chemo docetaxel to paclitaxel from next cycle, to decrease the risk of diarrhea, and to reduce her chemotherapy dose to ensure her tolerance.   Truitt Merle, MD 05/23/2016  9:36 PM

## 2016-05-23 NOTE — Progress Notes (Signed)
Orders had not been received from MD on  critical value of k+ and ca called in by night RN.  texted Day MD. Received order to add magnesium level to am labs and NS w 35mq k+ at 1271mhr.

## 2016-05-23 NOTE — Progress Notes (Signed)
CRITICAL VALUE ALERT  Critical value received:  Calcium 6.3   Date of notification:  05/23/16  Time of notification:  0655  Critical value read back:Yes.    Nurse who received alert:  Leona Carry  MD notified (1st page):  Baltazar Najjar, NP  Time of first page:  [phone]

## 2016-05-23 NOTE — Progress Notes (Signed)
     [facility] Progress Note  CC:  Ulcerative colitis  Subjective:  Is feeling better today.  Bleeding is becoming less.  Abdominal pain about resolved.  K+ was 2.6 this AM so is being replaced.  Objective:  Vital signs in last 24 hours: Temp:  [98.4 F (36.9 C)-98.6 F (37 C)] 98.6 F (37 C) (04/25 0437) Pulse Rate:  [66-99] 66 (04/25 0437) Resp:  [17-18] 18 (04/25 0437) BP: (134-140)/(57-73) 134/73 (04/25 0437) SpO2:  [99 %-100 %] 99 % (04/25 0437) Last BM Date: 05/22/16 General:  Alert, Well-developed, in NAD Heart:  Regular rate and rhythm; no murmurs Pulm: CTAB.  No increased WOB. Abdomen:  Soft, non-distended.  BS present.  Non-tender. Extremities:  Without edema. Neurologic:  Alert and oriented x 4;  grossly normal neurologically. Psych:  Alert and cooperative. Normal mood and affect.   Lab Results:  Recent Labs  05/20/16 1800 05/22/16 0542 05/23/16 0545  WBC 6.9 5.4 6.1  HGB 10.5* 9.9* 8.4*  HCT 31.5* 29.7* 25.5*  PLT 64* 82* 100*   BMET  Recent Labs  05/21/16 0430 05/22/16 0542 05/23/16 0545  NA 136 137 142  K 3.7 4.0 2.6*  CL 104 105 115*  CO2 24 25 21*  GLUCOSE 139* 138* 65  BUN <5* 5* 8  CREATININE 0.70 0.73 0.53  CALCIUM 7.9* 8.2* 6.3*   LFT  Recent Labs  05/23/16 0545  PROT 4.5*  ALBUMIN 1.9*  AST 12*  ALT 15  ALKPHOS 46  BILITOT 0.2*   Assessment / Plan: 1. Ulcerative Colitis, left sided: GIpathogen panel negative, likely has an acute flare of UC possibly triggered by food poisoning and in combination with immunosuppressive effect of her first chemo. On oral mesalamine, only 2.4 grams daily--increased to 4.8 grams on 4/23. Mesalamine enemas ordered but she cannot retain them. Prednisone 30 mg started 4/22--changed to solumedrol 60 mg on 4/24. Continue Bentyl for cramping.  Advanced to soft diet on 4/24. 2. Left breast cancer 3. Thrombocytopenia:  Improving. 4. Hyponatremia: Resolved. 5.  Hypokalemia:  2.6  this AM.  Being replaced. 6.  Acute blood loss anemia:  Hgb still trending down some.  She reports that bleeding is becoming less.    *Possible discharge on 4/24 pending that labs are stable.  Will need discharged on PO prednisone 40 mg daily with plans for taper. *Follow-up scheduled with me on 5/8 at 1:30 pm.  This appt is in discharge instructions.   LOS: 5 days   ZEHR, JESSICA D.  05/23/2016, 9:51 AM  Pager number [phone]  Agree with Ms. Alphia Kava management. From GI standpoint ok to go - call us back if ? Signing off Gatha Mayer, MD, [facility]

## 2016-05-24 DIAGNOSIS — R1084 Generalized abdominal pain: Secondary | ICD-10-CM

## 2016-05-24 LAB — GLUCOSE, CAPILLARY
GLUCOSE-CAPILLARY: 100 mg/dL — AB (ref 65–99)
GLUCOSE-CAPILLARY: 94 mg/dL (ref 65–99)
Glucose-Capillary: 179 mg/dL — ABNORMAL HIGH (ref 65–99)
Glucose-Capillary: 198 mg/dL — ABNORMAL HIGH (ref 65–99)

## 2016-05-24 LAB — CBC
HCT: 33.5 % — ABNORMAL LOW (ref 36.0–46.0)
Hemoglobin: 11 g/dL — ABNORMAL LOW (ref 12.0–15.0)
MCH: 28.3 pg (ref 26.0–34.0)
MCHC: 32.8 g/dL (ref 30.0–36.0)
MCV: 86.1 fL (ref 78.0–100.0)
PLATELETS: 149 10*3/uL — AB (ref 150–400)
RBC: 3.89 MIL/uL (ref 3.87–5.11)
RDW: 13.8 % (ref 11.5–15.5)
WBC: 8 10*3/uL (ref 4.0–10.5)

## 2016-05-24 LAB — BASIC METABOLIC PANEL
ANION GAP: 7 (ref 5–15)
BUN: 13 mg/dL (ref 6–20)
CALCIUM: 8.5 mg/dL — AB (ref 8.9–10.3)
CO2: 27 mmol/L (ref 22–32)
Chloride: 104 mmol/L (ref 101–111)
Creatinine, Ser: 0.73 mg/dL (ref 0.44–1.00)
Glucose, Bld: 104 mg/dL — ABNORMAL HIGH (ref 65–99)
Potassium: 4.2 mmol/L (ref 3.5–5.1)
Sodium: 138 mmol/L (ref 135–145)

## 2016-05-24 LAB — CALCIUM, IONIZED: Calcium, Ionized, Serum: 5 mg/dL (ref 4.5–5.6)

## 2016-05-24 LAB — MAGNESIUM: Magnesium: 2.2 mg/dL (ref 1.7–2.4)

## 2016-05-24 LAB — ALBUMIN: ALBUMIN: 2.6 g/dL — AB (ref 3.5–5.0)

## 2016-05-24 MED ORDER — HYDROCORTISONE ACETATE 25 MG RE SUPP
25.0000 mg | Freq: Two times a day (BID) | RECTAL | 0 refills | Status: DC
Start: 2016-05-24 — End: 2017-06-03

## 2016-05-24 MED ORDER — PREDNISONE 20 MG PO TABS: 40.0000 mg | ORAL_TABLET | Freq: Every day | ORAL | 0 refills | Status: DC

## 2016-05-24 MED ORDER — MESALAMINE 800 MG PO TBEC: 2.0000 | DELAYED_RELEASE_TABLET | Freq: Three times a day (TID) | ORAL | 5 refills | Status: DC

## 2016-05-24 MED ORDER — HEPARIN SOD (PORK) LOCK FLUSH 100 UNIT/ML IV SOLN
500.0000 [IU] | INTRAVENOUS | Status: AC | PRN
  Administered 2016-05-24: 500 [IU]

## 2016-05-24 MED ORDER — DICYCLOMINE HCL 20 MG PO TABS: 20.0000 mg | ORAL_TABLET | Freq: Three times a day (TID) | ORAL | 0 refills | Status: DC

## 2016-05-24 MED ORDER — HYDROCORTISONE ACETATE 25 MG RE SUPP
25.0000 mg | Freq: Two times a day (BID) | RECTAL | 0 refills | Status: DC
Start: 2016-05-24 — End: 2016-05-24

## 2016-05-24 NOTE — Discharge Summary (Signed)
Physician Discharge Summary  Brandi Dickson PJA:[id number] DOB: 12-04-1960 DOA: 05/18/2016  PCP: Orpah Melter, MD  Admit date: 05/18/2016 Discharge date: 05/24/2016  Admitted From: home Disposition:  Home  Recommendations for Outpatient Follow-up:  1. Follow up with PCP in 1-2 weeks 2. Please obtain BMP/CBC in one week 3. Please follow up on the following pending results:  Home Health:No Equipment/Devices:none  Discharge Condition:stable CODE STATUS:full Diet recommendation: Heart Healthy / Carb Modified / Regular / Dysphagia   Brief/Interim Summary: 56 y.o. female past medical history significant for breast cancer diabetes mellitus, essential hypertension, neuropathy secondary to diabetes mellitus and ulcerative colitis presents with bloody diarrhea and vomiting and thrombocytopenia is being treated for ulcerative colitis flare appreciate GIs assistance.  Discharge Diagnoses:  Active Problems:   Breast cancer of upper-outer quadrant of left female breast (HCC)   Hyponatremia   Hypokalemia   Hypomagnesemia   Dehydration   Blood in stool   Thrombocytopenia (HCC)   GI bleeding   Ulcerative rectosigmoiditis with rectal bleeding (HCC)   Hematochezia   Generalized abdominal pain   Nausea vomiting and diarrhea   Diarrhea of presumed infectious origin  Ulcerative colitis flare: CF PCR was negative GI pathogen panel was negative. GI was consulted they recommended to increase her mesalamine, she was also started on oral prednisone which she will continue for 2 weeks She'll follow-up with GI as an outpatient.  Normocytic anemia: Due to blood loss asymptomatic, she was transfused 1 unit of packed red blood cells, her hemoglobin has been stable.  Thrombocytopenia: Likely due to chemotherapy she is status post 2 units of packed platelets and her platelet has been started to trend up above 50,000 K.  Hyponatremia: Likely due to GI losses resolved with IV fluids.  Mild  Hypocalcemia: Asymptomatic unclear etiology, will follow up with primary care doctor as an outpatient.  Diabetes mellitus, type II with neuropathy: No changes were made.  Breast cancer of the upper-outer quadrant of the left breast -Stage IIA -Followed by [name] as an outpatient. -Recently received chemotherapy, Docetaxel, carboplatin, herceptin and pejeta.    Discharge Instructions  Discharge Instructions    Diet - low sodium heart healthy    Complete by:  As directed    Increase activity slowly    Complete by:  As directed      Allergies as of 05/24/2016      Reactions   Lisinopril Palpitations   Augmentin [amoxicillin-pot Clavulanate] Other (See Comments)   sts gives her a yeast infection Has patient had a PCN reaction causing immediate rash, facial/tongue/throat swelling, SOB or lightheadedness with hypotension: no Has patient had a PCN reaction causing severe rash involving mucus membranes or skin necrosis: no Has patient had a PCN reaction that required hospitalization no Has patient had a PCN reaction occurring within the last 10 years: unknown If all of the above answers are "NO", then may proceed with Cephalosporin use.   Benadryl [diphenhydramine] Itching, Anxiety   Per pt: "Makes my skin crawl"; makes pt sensitive to touch      Medication List    TAKE these medications   albuterol 108 (90 Base) MCG/ACT inhaler Commonly known as:  PROVENTIL HFA;VENTOLIN HFA Inhale 1-2 puffs into the lungs every 6 (six) hours as needed for wheezing or shortness of breath.   ASPIRIN 81 PO Take by mouth.   dexamethasone 4 MG tablet Commonly known as:  DECADRON Take 1 tablet (4 mg total) by mouth daily. Start the day before Taxotere. Then  again the day after chemo for 3 days.   dicyclomine 20 MG tablet Commonly known as:  BENTYL Take 1 tablet (20 mg total) by mouth 4 (four) times daily -  before meals and at bedtime.   diphenoxylate-atropine 2.5-0.025 MG tablet Commonly  known as:  LOMOTIL Take one to two tablets every six hours as needed for diarrhea   ferrous sulfate 325 (65 FE) MG tablet Commonly known as:  FERROUSUL Take 1 tablet (325 mg total) by mouth 3 (three) times daily with meals.   gabapentin 100 MG capsule Commonly known as:  NEURONTIN Take 200 mg by mouth at bedtime.   hydrocortisone 25 MG suppository Commonly known as:  ANUSOL-HC Place 1 suppository (25 mg total) rectally 2 (two) times daily.   lidocaine-prilocaine cream Commonly known as:  EMLA Apply 1 application to skin 1.5 to 2 hrs before use.  Cover to secure cream with plastic wrap.   loperamide 1 MG/5ML solution Commonly known as:  IMODIUM Take 3 mg by mouth as needed for diarrhea or loose stools.   LORazepam 0.5 MG tablet Commonly known as:  ATIVAN Take 1 tablet (0.5 mg total) by mouth once as needed for anxiety.   Mesalamine 800 MG Tbec Commonly known as:  ASACOL HD Take 2 tablets (1,600 mg total) by mouth 3 (three) times daily. What changed:  how much to take   metFORMIN 500 MG 24 hr tablet Commonly known as:  GLUCOPHAGE-XR Take 1,000 mg by mouth at bedtime.   metoprolol 50 MG tablet Commonly known as:  LOPRESSOR Take 50 mg by mouth at bedtime.   niacin 250 MG tablet Take 250 mg by mouth at bedtime.   ondansetron 8 MG tablet Commonly known as:  ZOFRAN Take 1 tablet (8 mg total) by mouth 2 (two) times daily as needed for refractory nausea / vomiting. Start on day 3 after chemo.   predniSONE 20 MG tablet Commonly known as:  DELTASONE Take 2 tablets (40 mg total) by mouth daily with breakfast.   prochlorperazine 10 MG tablet Commonly known as:  COMPAZINE Take 1 tablet (10 mg total) by mouth every 6 (six) hours as needed (Nausea or vomiting).   traMADol 50 MG tablet Commonly known as:  ULTRAM Take 1-2 tablets (50-100 mg total) by mouth every 6 (six) hours as needed.   triamterene-hydrochlorothiazide 75-50 MG tablet Commonly known as:  MAXZIDE Take 0.5  tablets by mouth daily.   VITAMINS FOR THE HAIR Tabs Take 2 tablets by mouth 2 (two) times daily.      Follow-up Information    ZEHR, JESSICA D., PA-C Follow up on 06/05/2016.   Specialty:  Gastroenterology Why:  Appt is at 1:30 pm.  Please call on May 2 with a symptom update.  Ask for Katina Degree, my nurse.  We may start tapering prednisone at that point if you are continuing to do better.  Contact information: [place] [address] [phone]          Allergies  Allergen Reactions  . Lisinopril Palpitations  . Augmentin [Amoxicillin-Pot Clavulanate] Other (See Comments)    sts gives her a yeast infection Has patient had a PCN reaction causing immediate rash, facial/tongue/throat swelling, SOB or lightheadedness with hypotension: no Has patient had a PCN reaction causing severe rash involving mucus membranes or skin necrosis: no Has patient had a PCN reaction that required hospitalization no Has patient had a PCN reaction occurring within the last 10 years: unknown If all of the  above answers are "NO", then may proceed with Cephalosporin use.   . Benadryl [Diphenhydramine] Itching and Anxiety    Per pt: "Makes my skin crawl"; makes pt sensitive to touch    Consultations:  None   Procedures/Studies: Dg Chest 2 View  Result Date: 05/13/2016 CLINICAL DATA:  Shortness of breath this morning EXAM: CHEST  2 VIEW COMPARISON:  05/09/2014 FINDINGS: Cardiomediastinal silhouette is stable. Right IJ Port-A-Cath is unchanged in position. No infiltrate or pleural effusion. No pulmonary edema. Bony thorax is stable. IMPRESSION: No active cardiopulmonary disease. Electronically Signed   By: Lahoma Crocker M.D.   On: 05/13/2016 14:47   Ct Angio Chest Pe W And/or Wo Contrast  Result Date: 05/13/2016 CLINICAL DATA:  Shortness of Breath EXAM: CT ANGIOGRAPHY CHEST WITH CONTRAST TECHNIQUE: Multidetector CT imaging of the chest was performed using the standard protocol during  bolus administration of intravenous contrast. Multiplanar CT image reconstructions and MIPs were obtained to evaluate the vascular anatomy. CONTRAST:  100 mL Isovue 370. COMPARISON:  Chest x-ray from earlier in the same day FINDINGS: Cardiovascular: Thoracic aorta shows a normal branching pattern with mild atherosclerotic changes in the descending thoracic aorta. No significant calcifications, aneurysmal dilatation or dissection is seen. The pulmonary artery shows a normal branching pattern without intraluminal filling defect to suggest pulmonary embolism. No cardiac enlargement is seen. No significant coronary calcifications are noted. A right chest wall port is seen in satisfactory position. Mediastinum/Nodes: The thoracic inlet is within normal limits. No sizable hilar or mediastinal adenopathy is noted. The esophagus as visualize is within normal limits. No axillary adenopathy is seen. Lungs/Pleura: Well aerated bilaterally without focal infiltrate or sizable effusion. No pneumothorax or parenchymal nodules are seen. Upper Abdomen: A hypodense 1.7 cm lesion is noted within the left adrenal gland likely representing a small adenoma. This is stable from a prior CT examination dated 07/15/2015. Musculoskeletal: No acute abnormality noted. Changes in the left breast are noted consistent with the given clinical history. Review of the MIP images confirms the above findings. IMPRESSION: No evidence of pulmonary emboli. Left breast mass consistent with the given clinical history. Stable left adrenal lesion likely representing a small adenoma. Electronically Signed   By: Inez Catalina M.D.   On: 05/13/2016 17:57   Mr Breast Bilateral W Wo Contrast  Result Date: 05/07/2016 CLINICAL DATA:  Biopsy proven invasive mammary carcinoma in the left breast. LABS:  Creatinine was obtained on site at [facility] at [address]Results: Creatinine 1.1 mg/dL. EXAM: BILATERAL BREAST MRI WITH AND WITHOUT CONTRAST  TECHNIQUE: Multiplanar, multisequence MR images of both breasts were obtained prior to and following the intravenous administration of 19 ml of MultiHance. THREE-DIMENSIONAL MR IMAGE RENDERING ON INDEPENDENT WORKSTATION: Three-dimensional MR images were rendered by post-processing of the original MR data on an independent workstation. The three-dimensional MR images were interpreted, and findings are reported in the following complete MRI report for this study. Three dimensional images were evaluated at the independent DynaCad workstation COMPARISON:  Previous exam(s). FINDINGS: Breast composition: b. Scattered fibroglandular tissue. Background parenchymal enhancement: Moderate. Right breast: No mass or abnormal enhancement. Left breast: There is an irregular enhancing mass in the upper-outer quadrant of the left breast measuring 4.0 x 2.7 x 3.1 cm in anterior posterior, transverse and craniocaudal dimensions. There is linear enhancement extending 2.6 cm posterior to the mass concerning for ductal carcinoma in-situ. The mass and linear enhancement measure 6.6 cm in anterior posterior dimension. There is a signal void artifact in the  anterior aspect of the enhancing mass from the biopsy clip. Lymph nodes: No abnormal appearing lymph nodes. Ancillary findings:  None. IMPRESSION: Lobulated enhancing mass in the upper-outer quadrant of the left breast corresponding with the recently diagnosed invasive mammary carcinoma. Linear enhancement extends 2.6 cm posterior to the mass worrisome for ductal carcinoma in-situ. RECOMMENDATION: Treatment planning of the biopsy proven left breast invasive mammary carcinoma is recommended. There is a biopsy clip at the anterior aspect of the mass. If pathologic proof of extent of disease is clinically indicated MRI biopsy could be performed of the posterior aspect of the linear enhancement. BI-RADS CATEGORY  6: Known biopsy-proven malignancy. Electronically Signed   By: Lillia Mountain M.D.    On: 05/07/2016 10:40   Dg Chest Port 1 View  Result Date: 05/08/2016 CLINICAL DATA:  Post- operative from Port-A-Cath appliance in Cerner shin. History of asthma EXAM: PORTABLE CHEST 1 VIEW COMPARISON:  PA and lateral chest x-ray of September 21, 2014 FINDINGS: The lungs are mildly hypoinflated. There is no postprocedure pneumothorax following right Port-A-Cath placement via the right internal jugular approach. The cardiac silhouette is enlarged. There is patchy density at the right lung base and in the perihilar regions bilaterally. IMPRESSION: No immediate postprocedure complication following Port-A-Cath placement on the right. The tip of the catheter overlies the midportion of the SVC. Electronically Signed   By: David  Martinique M.D.   On: 05/08/2016 12:34   Dg Fluoro Guide Cv Line-no Report  Result Date: 05/08/2016 Fluoroscopy was utilized by the requesting physician.  No radiographic interpretation.     Subjective: No complains  Discharge Exam: Vitals:   05/23/16 2015 05/24/16 0454  BP: (!) 145/81 (!) 162/88  Pulse: 76 75  Resp: 18 20  Temp: 98.5 F (36.9 C) 98.5 F (36.9 C)   Vitals:   05/23/16 0437 05/23/16 1327 05/23/16 2015 05/24/16 0454  BP: 134/73 (!) 150/86 (!) 145/81 (!) 162/88  Pulse: 66 83 76 75  Resp: 18 20 18 20   Temp: 98.6 F (37 C) 98.7 F (37.1 C) 98.5 F (36.9 C) 98.5 F (36.9 C)  TempSrc: Oral Oral Oral Oral  SpO2: 99% 100% 100% 100%  Weight:      Height:        General: Pt is alert, awake, not in acute distress Cardiovascular: Rate and rhythm positive S1-S2 Respiratory: Good air movement to auscultation Abdominal: Positive bowel sounds on the tended, nontender Extremities: No clubbing cyanosis or edema.    The results of significant diagnostics from this hospitalization (including imaging, microbiology, ancillary and laboratory) are listed below for reference.     Microbiology: Recent Results (from the past 240 hour(s))  MRSA PCR Screening      Status: None   Collection Time: 05/18/16 10:50 PM  Result Value Ref Range Status   MRSA by PCR NEGATIVE NEGATIVE Final    Comment:        The GeneXpert MRSA Assay (FDA approved for NASAL specimens only), is one component of a comprehensive MRSA colonization surveillance program. It is not intended to diagnose MRSA infection nor to guide or monitor treatment for MRSA infections.   Gastrointestinal Panel by PCR , Stool     Status: None   Collection Time: 05/19/16 12:15 AM  Result Value Ref Range Status   Campylobacter species NOT DETECTED NOT DETECTED Final   Plesimonas shigelloides NOT DETECTED NOT DETECTED Final   Salmonella species NOT DETECTED NOT DETECTED Final   Yersinia enterocolitica NOT DETECTED NOT DETECTED Final  Vibrio species NOT DETECTED NOT DETECTED Final   Vibrio cholerae NOT DETECTED NOT DETECTED Final   Enteroaggregative E coli (EAEC) NOT DETECTED NOT DETECTED Final   Enteropathogenic E coli (EPEC) NOT DETECTED NOT DETECTED Final   Enterotoxigenic E coli (ETEC) NOT DETECTED NOT DETECTED Final   Shiga like toxin producing E coli (STEC) NOT DETECTED NOT DETECTED Final   Shigella/Enteroinvasive E coli (EIEC) NOT DETECTED NOT DETECTED Final   Cryptosporidium NOT DETECTED NOT DETECTED Final   Cyclospora cayetanensis NOT DETECTED NOT DETECTED Final   Entamoeba histolytica NOT DETECTED NOT DETECTED Final   Giardia lamblia NOT DETECTED NOT DETECTED Final   Adenovirus F40/41 NOT DETECTED NOT DETECTED Final   Astrovirus NOT DETECTED NOT DETECTED Final   Norovirus GI/GII NOT DETECTED NOT DETECTED Final   Rotavirus A NOT DETECTED NOT DETECTED Final   Sapovirus (I, II, IV, and V) NOT DETECTED NOT DETECTED Final  C difficile quick scan w PCR reflex     Status: None   Collection Time: 05/19/16 12:15 AM  Result Value Ref Range Status   C Diff antigen NEGATIVE NEGATIVE Final   C Diff toxin NEGATIVE NEGATIVE Final   C Diff interpretation No C. difficile detected.  Final      Labs: BNP (last 3 results)  Recent Labs  05/13/16 1431  BNP 25.4   Basic Metabolic Panel:  Recent Labs Lab 05/18/16 1653 05/19/16 0740 05/20/16 0420 05/21/16 0430 05/22/16 0542 05/23/16 0545 05/23/16 1038 05/24/16 0531  NA 125* 130*  130* 134* 136 137 142  --  138  K 3.2* 4.0  4.0 3.2* 3.7 4.0 2.6*  --  4.2  CL 92* 98*  98* 103 104 105 115*  --  104  CO2 24 23  23 25 24 25  21*  --  27  GLUCOSE 126* 101*  102* 102* 139* 138* 65  --  104*  BUN 19 19  19 10  <5* 5* 8  --  13  CREATININE 1.00 1.00  0.99 0.77 0.70 0.73 0.53  --  0.73  CALCIUM 7.9* 8.2*  8.2* 7.7* 7.9* 8.2* 6.3*  --  8.5*  MG 1.4* 2.4 2.1 2.1  --   --  1.8 2.2  PHOS 2.8 2.6  --   --   --   --   --   --    Liver Function Tests:  Recent Labs Lab 05/18/16 1238 05/19/16 0740 05/23/16 0545 05/24/16 0531  AST 23 35 12*  --   ALT 19 26 15   --   ALKPHOS 85 72 46  --   BILITOT 1.21* 0.8 0.2*  --   PROT 6.7 5.6* 4.5*  --   ALBUMIN 2.7* 2.4* 1.9* 2.6*   No results for input(s): LIPASE, AMYLASE in the last 168 hours. No results for input(s): AMMONIA in the last 168 hours. CBC:  Recent Labs Lab 05/18/16 1238  05/18/16 1653 05/19/16 0740  05/20/16 0800 05/20/16 1800 05/22/16 0542 05/23/16 0545 05/24/16 0531  WBC 2.4*  < > 2.5* 4.9  < > 7.0 6.9 5.4 6.1 8.0  NEUTROABS 1.4*  --  1.2* 2.8  --   --   --  4.1  --   --   HGB 12.7  < > 11.1* 11.0*  < > 10.1* 10.5* 9.9* 8.4* 11.0*  HCT 38.3  < > 32.4* 32.3*  < > 30.2* 31.5* 29.7* 25.5* 33.5*  MCV 85.3  < > 80.8 82.8  < > 85.1 85.6 85.3 84.2 86.1  PLT 54*  < > 36* 52*  < > 58* 64* 82* 100* 149*  < > = values in this interval not displayed. Cardiac Enzymes: No results for input(s): CKTOTAL, CKMB, CKMBINDEX, TROPONINI in the last 168 hours. BNP: Invalid input(s): POCBNP CBG:  Recent Labs Lab 05/23/16 1712 05/23/16 2011 05/24/16 0014 05/24/16 0450 05/24/16 0757  GLUCAP 172* 171* 179* 100* 94   D-Dimer No results for input(s): DDIMER in the  last 72 hours. Hgb A1c No results for input(s): HGBA1C in the last 72 hours. Lipid Profile No results for input(s): CHOL, HDL, LDLCALC, TRIG, CHOLHDL, LDLDIRECT in the last 72 hours. Thyroid function studies No results for input(s): TSH, T4TOTAL, T3FREE, THYROIDAB in the last 72 hours.  Invalid input(s): FREET3 Anemia work up No results for input(s): VITAMINB12, FOLATE, FERRITIN, TIBC, IRON, RETICCTPCT in the last 72 hours. Urinalysis    Component Value Date/Time   COLORURINE YELLOW 01/18/2015 1209   APPEARANCEUR CLEAR 01/18/2015 1209   LABSPEC 1.014 01/18/2015 1209   PHURINE 6.5 01/18/2015 1209   GLUCOSEU NEGATIVE 01/18/2015 1209   HGBUR NEGATIVE 01/18/2015 1209   BILIRUBINUR NEGATIVE 01/18/2015 1209   KETONESUR NEGATIVE 01/18/2015 1209   PROTEINUR NEGATIVE 01/18/2015 1209   UROBILINOGEN 0.2 12/30/2008 1317   NITRITE NEGATIVE 01/18/2015 1209   LEUKOCYTESUR NEGATIVE 01/18/2015 1209   Sepsis Labs Invalid input(s): PROCALCITONIN,  WBC,  LACTICIDVEN Microbiology Recent Results (from the past 240 hour(s))  MRSA PCR Screening     Status: None   Collection Time: 05/18/16 10:50 PM  Result Value Ref Range Status   MRSA by PCR NEGATIVE NEGATIVE Final    Comment:        The GeneXpert MRSA Assay (FDA approved for NASAL specimens only), is one component of a comprehensive MRSA colonization surveillance program. It is not intended to diagnose MRSA infection nor to guide or monitor treatment for MRSA infections.   Gastrointestinal Panel by PCR , Stool     Status: None   Collection Time: 05/19/16 12:15 AM  Result Value Ref Range Status   Campylobacter species NOT DETECTED NOT DETECTED Final   Plesimonas shigelloides NOT DETECTED NOT DETECTED Final   Salmonella species NOT DETECTED NOT DETECTED Final   Yersinia enterocolitica NOT DETECTED NOT DETECTED Final   Vibrio species NOT DETECTED NOT DETECTED Final   Vibrio cholerae NOT DETECTED NOT DETECTED Final   Enteroaggregative E  coli (EAEC) NOT DETECTED NOT DETECTED Final   Enteropathogenic E coli (EPEC) NOT DETECTED NOT DETECTED Final   Enterotoxigenic E coli (ETEC) NOT DETECTED NOT DETECTED Final   Shiga like toxin producing E coli (STEC) NOT DETECTED NOT DETECTED Final   Shigella/Enteroinvasive E coli (EIEC) NOT DETECTED NOT DETECTED Final   Cryptosporidium NOT DETECTED NOT DETECTED Final   Cyclospora cayetanensis NOT DETECTED NOT DETECTED Final   Entamoeba histolytica NOT DETECTED NOT DETECTED Final   Giardia lamblia NOT DETECTED NOT DETECTED Final   Adenovirus F40/41 NOT DETECTED NOT DETECTED Final   Astrovirus NOT DETECTED NOT DETECTED Final   Norovirus GI/GII NOT DETECTED NOT DETECTED Final   Rotavirus A NOT DETECTED NOT DETECTED Final   Sapovirus (I, II, IV, and V) NOT DETECTED NOT DETECTED Final  C difficile quick scan w PCR reflex     Status: None   Collection Time: 05/19/16 12:15 AM  Result Value Ref Range Status   C Diff antigen NEGATIVE NEGATIVE Final   C Diff toxin NEGATIVE NEGATIVE Final   C Diff interpretation No C. difficile detected.  Final     Time coordinating discharge: Over 30 minutes  SIGNED:   Charlynne Cousins, MD  [facility] 05/24/2016, 12:26 PM Pager   If 7PM-7AM, please contact night-coverage [email or web address] Password [email or web address]

## 2016-05-24 NOTE — Care Management Note (Signed)
Case Management Note  Patient Details  Name: NYIA TSAO MRN: [id number] Date of Birth: Feb 14, 1960  Subjective/Objective:                    Action/Plan:d/c home.   Expected Discharge Date:  05/24/16               Expected Discharge Plan:  Home/Self Care  In-House Referral:     Discharge planning Services  CM Consult  Post Acute Care Choice:    Choice offered to:     DME Arranged:    DME Agency:     HH Arranged:    HH Agency:     Status of Service:  Completed, signed off  If discussed at [employer] of Stay Meetings, dates discussed:    Additional Comments:  Dessa Phi, RN 05/24/2016, 12:39 PM

## 2016-05-24 NOTE — Progress Notes (Signed)
Nutrition Follow-up  DOCUMENTATION CODES:   Obesity unspecified  INTERVENTION:  - Continue to encourage PO intakes of meals.  - RD will continue to monitor for additional needs if pt unable to d/c today.   NUTRITION DIAGNOSIS:   Increased nutrient needs related to cancer and cancer related treatments as evidenced by estimated needs. -ongoing  GOAL:   Patient will meet greater than or equal to 90% of their needs -met at this time.  MONITOR:   PO intake, Supplement acceptance, Labs, Weight trends, Diet advancement, I & O's  ASSESSMENT:   56 y.o. female with medical history significant of 68 diagnosed breast cancer, DM 2 asthma, HTN, neuropathy secondary to diabetes, ulcerative colitis  4/26 Pt eating 100% of meals yesterday and for breakfast this AM. She is a possible d/c for this AM. During previous RD assessment, pt was ordered Boost Breeze TID which she has not been drinking. Will d/c this supplement, especially since pt may d/c today. No new weight since admission.   Medications reviewed; sliding scale Novolog, 2 g IV Mg sulfate x1 run yesterday, 60 mg IV Solu-medrol/day, 40 mg IV Protonix BID. Labs reviewed; CBGs: 94 and 179 mg/dL today, Ca: 8.5 mg/dL.  IVF: NS-80 mEq KCl @ 125 mL/hr.   4/21 - Pt last seen by [facility] on 4/4, at that time pt was reporting normal appetite and eating well.  - Pt now reports a week of N/V/D.  - Prior to these symptoms she was eating normally.  - Ate at a potluck and became sick after eating on 4/14.  - Pt on clear liquids at this time.  - Will order Boost Breeze supplements while on clears. - Per chart review, pt has lost 9 lb since 2/6 (4% wt loss x 2.5 months, insignificant for time frame).  - Nutrition focused physical exam shows no sign of depletion of muscle mass or body fat.   Diet Order:  Diet regular Room service appropriate? Yes; Fluid consistency: Thin  Skin:  Wound (see comment) (4/10 R breast incision)  Last BM:   4/25  Height:   Ht Readings from Last 1 Encounters:  05/18/16 _0  (1.626 m)    Weight:   Wt Readings from Last 1 Encounters:  05/18/16 213 lb (96.6 kg)    Ideal Body Weight:  54.5 kg  BMI:  Body mass index is 36.56 kg/m.  Estimated Nutritional Needs:   Kcal:  1950-2150  Protein:  80-90g  Fluid:  2L/day  EDUCATION NEEDS:   No education needs identified at this time    Jarome Matin, MS, RD, LDN, CNSC Inpatient Clinical Dietitian Pager # [phone] After hours/weekend pager # [phone]

## 2016-05-25 LAB — VITAMIN D 25 HYDROXY (VIT D DEFICIENCY, FRACTURES): Vit D, 25-Hydroxy: 10.6 ng/mL — ABNORMAL LOW (ref 30.0–100.0)

## 2016-05-29 ENCOUNTER — Telehealth: Payer: Self-pay | Admitting: Hematology

## 2016-05-29 NOTE — Telephone Encounter (Signed)
Confirmed MD/lab appt change to 5/3 at 315 pm per LOS

## 2016-05-30 NOTE — Progress Notes (Signed)
[place]  [phone] [phone]  Clinic Follow Up Note   Patient Care Team: Orpah Melter, MD as PCP - General (Family Medicine) [name], MD as Consulting Physician (General Surgery) Truitt Merle, MD as Consulting Physician (Hematology) Gery Pray, MD as Consulting Physician (Radiation Oncology) Ladene Artist, MD as Consulting Physician (Gastroenterology) 05/31/2016  CHIEF COMPLAINTS:  Follow up left breast cancer  Oncology History   Cancer Staging Breast cancer of upper-outer quadrant of left female breast [facility]) Staging form: Breast, AJCC 8th Edition - Clinical stage from 04/25/2016: Stage IIA (cT2, cN0, cM0, G3, ER: Negative, PR: Negative, HER2: Positive) - Signed by Truitt Merle, MD on 05/02/2016       Breast cancer of upper-outer quadrant of left female breast ([place])   04/24/2016 Mammogram    Category B breasts with a new irregular mass in the UOQ left breast middle depth. Ultrasound revealed a 3.9 cm mass in the 1:00 position. The left axilla was negative.       04/25/2016 Initial Biopsy    Biopsy of the left breast showed grade 3 invasive ductal carcinoma, DCIS, and lymphovascular invasion was present.      04/25/2016 Receptors her2    ER 0% negative, PR 0% negative, HER2 positive, Ki67 30%      05/02/2016 Initial Diagnosis    Breast cancer of upper-outer quadrant of left female breast ([place])     05/07/2016 Imaging    MRI of the bilateral breast 05/07/16 IMPRESSION: Lobulated enhancing mass (4.0 x 2.7 x 3.1 cm) in the upper-outer quadrant of the left breast corresponding with the recently diagnosed invasive mammary carcinoma. Linear enhancement extends 2.6 cm posterior to the mass worrisome for ductal carcinoma in-situ.      05/09/2016 Echocardiogram    Echo 05/09/16 -LF EF: 55-60%      05/11/2016 -  Neo-Adjuvant Chemotherapy    Docetaxel, Carboplatin, Herceptin and pejeta (TCHP) every 3 weeks, for total of 6 cycles, followed by  Herceptin and perjeta maintenance therapy to complete 1 year treatment.      05/13/2016 Imaging    CT Angio Chest PE IMPRESSION: No evidence of pulmonary emboli. Left breast mass consistent with the given clinical history. Stable left adrenal lesion likely representing a small adenoma.      05/18/2016 - 05/24/2016 Hospital Admission    Patient presented with nausea, vomiting, and diarrhea; admitted to hospital with Hyponatremia       HISTORY OF PRESENTING ILLNESS (05/02/16):  Brandi Dickson 56 y.o. female is here because of a new diagnosis of left breast cancer. She is accompanied by her husband to our multidisciplinary breast clinic today.  The patient presented with a palpable left breast lump approximately 2 weeks ago. Bilateral diagnostic mammogram on 04/24/16 showed Category B breasts with a new irregular mass in the UOQ left breast middle depth. Ultrasound performed on 04/24/16 revealed a 3.9 cm mass in the 1:00 position. The left axilla was negative.  Biopsy of the left breast on 04/25/16 showed grade 3 invasive ductal carcinoma, DCIS, and lymphovascular invasion was present (ER 0% negative, PR 0% negative, HER2 positive, Ki67 30%).  She denies tenderness of the biopsied area. Reports minor dimpling. The patient is 8 weeks out from a left hip replacement. She had a right hip replacement 2010 years ago.  The patient and her husband present today in multidisciplinary breast clinic to discuss treatment options for the management of her disease.  The patient is taking Neurontin for neuropathy in  her feet from diabetes. She has been diagnosed for diabetes for the past 4 years. She states she only has neuropathy at night and take 2 Neurontin at night and then has no symptoms.  GYN HISTORY  Menarchal: 11 LMP: Complete hysterectomy for uterine fibroids ~ 2009. She was bleeding too much with her last menstrual cycle lasting 6 weeks. No issues with menopause. Contraceptive: no  HRT: No GP:  G1P0  CURRENT THERAPY: Docetaxel, Carboplatin, Herceptin and pejeta (TCHP) every 3 weeks, for total of 6 cycles, followed by Herceptin and perjeta maintenance therapy to complete 1 year treatment starting 05/11/16; Docetaxel changed to Taxol, carbo dose reduced to AUC 4.5 (from 5)  from cycle 2 due to severe diarrhea and cytopenia, on 06/01/16  INTERVAL HISTORY: Brandi Dickson returns for follow up.  The patient was discharged from the hospital 1 week ago. She reports her energy and appetite are greatly improved, almost back to normal. She reports her bowel movements are "mostly back to normal." She reports loose bowel movements 3-5 times a day, for which she takes Lomotil and Imodium once daily. She denies hematochezia.   MEDICAL HISTORY:  Past Medical History:  Diagnosis Date  . Arthritis   . Asthma    triggered with [name] perfumes and cigarette smoke  . Cancer ([place])   . Colitis   . Diabetes mellitus without complication ([place])   . Hypertension   . Neuropathy     SURGICAL HISTORY: Past Surgical History:  Procedure Laterality Date  . ABDOMINAL HYSTERECTOMY    . DILATION AND CURETTAGE OF UTERUS    . PORTACATH PLACEMENT Right 05/08/2016   Procedure: INSERTION PORT-A-CATH WITH [place];  Surgeon: [name], MD;  Location: [facility];  Service: General;  Laterality: Right;  . TONSILLECTOMY    . TOTAL HIP ARTHROPLASTY Right   . TOTAL HIP ARTHROPLASTY Left 03/06/2016   Procedure: LEFT TOTAL HIP ARTHROPLASTY ANTERIOR APPROACH;  Surgeon: [name], MD;  Location: [facility];  Service: Orthopedics;  Laterality: Left;    SOCIAL HISTORY: Social History   Social History  . Marital status: Married    Spouse name: N/A  . Number of children: N/A  . Years of education: N/A   Occupational History  . Not on file.   Social History Main Topics  . Smoking status: Former Smoker    Packs/day: 1.00    Years: 29.00    Types: Cigarettes    Quit date: 10/27/2002  . Smokeless  tobacco: Never Used  . Alcohol use No  . Drug use: No  . Sexual activity: Yes    Birth control/ protection: Surgical   Other Topics Concern  . Not on file   Social History Narrative  . No narrative on file    FAMILY HISTORY: Family History  Problem Relation Age of Onset  . Colon cancer Father   . Stomach cancer Paternal Uncle   . Stomach cancer Paternal Uncle   . Melanoma Brother   . Thyroid cancer Brother   . Breast cancer Maternal Aunt   . Breast cancer Maternal Aunt   . Breast cancer Cousin     ALLERGIES:  is allergic to lisinopril; augmentin [amoxicillin-pot clavulanate]; and benadryl [diphenhydramine].  MEDICATIONS:  Current Outpatient Prescriptions  Medication Sig Dispense Refill  . albuterol (PROVENTIL HFA;VENTOLIN HFA) 108 (90 Base) MCG/ACT inhaler Inhale 1-2 puffs into the lungs every 6 (six) hours as needed for wheezing or shortness of breath.    . ASPIRIN 81 PO Take  by mouth.    . dicyclomine (BENTYL) 20 MG tablet Take 1 tablet (20 mg total) by mouth 4 (four) times daily -  before meals and at bedtime. 15 tablet 0  . diphenoxylate-atropine (LOMOTIL) 2.5-0.025 MG tablet Take one to two tablets every six hours as needed for diarrhea 30 tablet 1  . ferrous sulfate (FERROUSUL) 325 (65 FE) MG tablet Take 1 tablet (325 mg total) by mouth 3 (three) times daily with meals.    . gabapentin (NEURONTIN) 100 MG capsule Take 200 mg by mouth at bedtime.     . hydrocortisone (ANUSOL-HC) 25 MG suppository Place 1 suppository (25 mg total) rectally 2 (two) times daily. 12 suppository 0  . lidocaine-prilocaine (EMLA) cream Apply 1 application to skin 1.5 to 2 hrs before use.  Cover to secure cream with plastic wrap. 30 g PRN  . loperamide (IMODIUM) 1 MG/5ML solution Take 3 mg by mouth as needed for diarrhea or loose stools.    [name] Kitchen LORazepam (ATIVAN) 0.5 MG tablet Take 1 tablet (0.5 mg total) by mouth once as needed for anxiety. 2 tablet 0  . Mesalamine (ASACOL HD) 800 MG TBEC Take 2  tablets (1,600 mg total) by mouth 3 (three) times daily. 90 tablet 5  . metFORMIN (GLUCOPHAGE-XR) 500 MG 24 hr tablet Take 1,000 mg by mouth at bedtime.    . metoprolol (LOPRESSOR) 50 MG tablet Take 50 mg by mouth at bedtime.     . niacin 250 MG tablet Take 250 mg by mouth at bedtime.     . ondansetron (ZOFRAN) 8 MG tablet Take 1 tablet (8 mg total) by mouth 2 (two) times daily as needed for refractory nausea / vomiting. Start on day 3 after chemo. 30 tablet 1  . predniSONE (DELTASONE) 20 MG tablet Take 2 tablets (40 mg total) by mouth daily with breakfast. 40 tablet 0  . prochlorperazine (COMPAZINE) 10 MG tablet Take 1 tablet (10 mg total) by mouth every 6 (six) hours as needed (Nausea or vomiting). 30 tablet 1  . Specialty Vitamins Products (VITAMINS FOR THE HAIR) TABS Take 2 tablets by mouth 2 (two) times daily.    . traMADol (ULTRAM) 50 MG tablet Take 1-2 tablets (50-100 mg total) by mouth every 6 (six) hours as needed. 40 tablet 0  . triamterene-hydrochlorothiazide (MAXZIDE) 75-50 MG per tablet Take 0.5 tablets by mouth daily.     [name] Kitchen dexamethasone (DECADRON) 4 MG tablet Take 1 tablet (4 mg total) by mouth daily. Start the day before Taxotere. Then again the day after chemo for 3 days. (Patient not taking: Reported on 05/31/2016) 30 tablet 1   No current facility-administered medications for this visit.     REVIEW OF SYSTEMS:   Constitutional: Denies fevers, chills or abnormal night sweats Eyes: Denies blurriness of vision, double vision or watery eyes Ears, nose, mouth, throat, and face: Denies mucositis or sore throat Respiratory: Denies cough, dyspnea or wheezes Cardiovascular: Denies palpitation, chest discomfort or lower extremity swelling Gastrointestinal:  Denies nausea, heartburn (+) diarrhea 3-5 times daily, (+) flatus Skin: Denies abnormal skin rashes Lymphatics: Denies new lymphadenopathy or easy bruising Neurological: (+) Foot numbness from diabetic neuropathy. (+) Right chest  pain/discomfort from port a cath placement. Behavioral/Psych: Mood is stable, no new changes  All other systems were reviewed with the patient and are negative.  PHYSICAL EXAMINATION: ECOG PERFORMANCE STATUS: 1 - Symptomatic but completely ambulatory  Vitals:   05/31/16 1628  BP: (!) 142/75  Pulse: 78  Resp: 20  Temp: 98.4 F (36.9 C)   Filed Weights   05/31/16 1628  Weight: 201 lb 8 oz (91.4 kg)    GENERAL:alert, no distress and comfortable SKIN: skin color, texture, turgor are normal, no rashes or significant lesions EYES: normal, conjunctiva are pink and non-injected, sclera clear OROPHARYNX:no exudate, no erythema and lips, buccal mucosa, and tongue normal  NECK: supple, thyroid normal size, non-tender, without nodularity LYMPH:  no palpable lymphadenopathy in the cervical, axillary or inguinal LUNGS: clear to auscultation and percussion with normal breathing effort HEART: regular rate & rhythm and no murmurs and no lower extremity edema ABDOMEN:abdomen soft, non-tender and normal bowel sounds Musculoskeletal:no cyanosis of digits and no clubbing  PSYCH: alert & oriented x 3 with fluent speech NEURO: no focal motor/sensory deficits BREAST: Breast inspection showed them to be symmetrical with no nipple discharge. Left breast exam showed UOQ 2 x 2.5 cm palpable mass, non tender, and smaller in size than previous exam. No palpable mass in the right breast and bilateral axilla.  LABORATORY DATA:  I have reviewed the data as listed CBC Latest Ref Rng & Units 05/31/2016 05/24/2016 05/23/2016  WBC 3.9 - 10.3 10e3/uL 8.6 8.0 6.1  Hemoglobin 11.6 - 15.9 g/dL 12.0 11.0(L) 8.4(L)  Hematocrit 34.8 - 46.6 % 38.0 33.5(L) 25.5(L)  Platelets 145 - 400 10e3/uL 189 149(L) 100(L)   CMP Latest Ref Rng & Units 05/31/2016 05/24/2016 05/23/2016  Glucose 70 - 140 mg/dl 168(H) 104(H) 65  BUN 7.0 - 26.0 mg/dL 16._0 Creatinine 0.6 - 1.1 mg/dL 0.8 0.73 0.53  Sodium 136 - 145 mEq/L 140 138 142    Potassium 3.5 - 5.1 mEq/L 3.6 4.2 2.6(LL)  Chloride 101 - 111 mmol/L - 104 115(H)  CO2 22 - 29 mEq/L 25 27 21(L)  Calcium 8.4 - 10.4 mg/dL 9.1 8.5(L) 6.3(LL)  Total Protein 6.4 - 8.3 g/dL 6.3(L) - 4.5(L)  Total Bilirubin 0.20 - 1.20 mg/dL 0.30 - 0.2(L)  Alkaline Phos 40 - 150 U/L 90 - 46  AST 5 - 34 U/L 11 - 12(L)  ALT 0 - 55 U/L 18 - 15   PATHOLOGY REPORT:   ADDITIONAL INFORMATION: 04/25/16 PROGNOSTIC INDICATORS Results: IMMUNOHISTOCHEMICAL AND MORPHOMETRIC ANALYSIS PERFORMED MANUALLY Estrogen Receptor: 0%, NEGATIVE Progesterone Receptor: 0%, NEGATIVE Proliferation Marker Ki67: 30% COMMENT: The negative hormone receptor study(ies) in this case has no internal positive control. REFERENCE RANGE ESTROGEN RECEPTOR NEGATIVE 0% POSITIVE =>1% REFERENCE RANGE PROGESTERONE RECEPTOR NEGATIVE 0% POSITIVE =>1% All controls stained appropriately Enid Cutter MD Pathologist, Electronic Signature ( Signed 05/01/2016) FLUORESCENCE IN-SITU HYBRIDIZATION Results: HER2 - **POSITIVE** RATIO OF HER2/CEP17 SIGNALS 2.25 AVERAGE HER2 COPY NUMBER PER CELL 8.45 1 of 3 FINAL for Thayne, Luciel P ([id number]) ADDITIONAL INFORMATION:(continued) Reference Range: NEGATIVE HER2/CEP17 Ratio <2.0 and average HER2 copy number <4.0 EQUIVOCAL HER2/CEP17 Ratio <2.0 and average HER2 copy number 4.0 and <6.0 POSITIVE HER2/CEP17 Ratio >=2.0 or <2.0 and average HER2 copy number >=6.0 Enid Cutter MD Pathologist, Electronic Signature ( Signed 04/30/2016) FINAL DIAGNOSIS Diagnosis Breast, left, needle core biopsy - INVASIVE DUCTAL CARCINOMA, SEE COMMENT. - DUCTAL CARCINOMA IN SITU. - LYMPHOVASCULAR INVASION PRESENT. Microscopic Comment The carcinoma appears grade 3 with focal squamous differentiation. Prognostic markers will be ordered. Dr. Lyndon Code has reviewed the case. The case was called to Dr. Isaiah Blakes on 04/26/2016. Vicente Males MD Pathologist, Electronic Signature (Case signed  04/26/2016)  RADIOGRAPHIC STUDIES: I have personally reviewed the radiological images as listed and agreed with the findings in the report.  See onc history  Echo 05/09/16 Study Conclusions -LF EF: 55-60% - Left ventricle: The cavity size was normal. There was mild focal   basal hypertrophy of the septum. Indeterminant diastolic   function. Systolic function was normal. The estimated ejection   fraction was in the range of 55% to 60%. Wall motion was normal;   there were no regional wall motion abnormalities. GLS abnormal at   -13.3%, poor images however.  ASSESSMENT & PLAN: 57 y.o. post-menopausal Caucasian female with a self palpated left breast mass.  1. Breast cancer of upper-outer quadrant of left breast, invasive ductal carcinoma,  stage IIA (cT2N0M0) grade 3, ER-, PR-, HER2 amplified -We previously reviewed the patient's imaging and pathology. -We previously reviewed her staging and biology of her breast cancer  -We previously discussed that surgical resection is the definitive treatment for breast cancer, she was seen by breast surgeon Dr. Lucia Gaskins today, lumpectomy versus mastectomy were discussed with patient. -We previously discussed HER2 positive breast cancers total approximately 15% of breast cancers and happens to be more aggressive than HER2 negative cancers, especially ER and PR negative disease, she has high likelihood of cancer recurrence after complete surgical resection.  -I recommend neoadjuvant chemotherapy TCHP (docetaxel, carboplatin, Herceptin and pejeta) every 3 weeks for 6 cycles, followed by maintenance Herceptin and pejeta to complete 1 year therapy -Her baseline echo was normal, she was seen by cardiologist Dr. Benjamine Mola -She unfortunately developed severe diarrhea and cytopenia after first cycle chemotherapy, was hospitalized for a week. She has now recovered well, still has moderate diarrhea. -I recommend change docetaxel to paclitaxel, which causes less  diarrhea. I'll also reduce her carboplatin from AUC 5 to 4.5, due to cytopenia. She will receive Neulasta on day 3. I'll slightly decreased pejeta dose by 10% for cycle 2. If she tolerates cycle 2 well, pejeta will be back to full dose for cycle 3. -Today's lab results reviewed, her cytopenia has completed her resolved, CMP are unremarkable, adequate for treatment, she will start cycle 2 chemotherapy tomorrow.  2. Genetics -Given her strong family history of breast cancer, we recommend her to see genetic counseling to ruled out inheritable breast cancer syndrome. She agreed. -Genetic counseling scheduled 05/17/16.  3. Type 2 Diabetes mellitus, HTN -Managed by her PCP. -The patient has peripheral neuropathy in her feet from her diabetes. The patient is already on Neurontin with 200 mg at night. We discussed that chemotherapy may make her neuropathy worse. -Steroids will be given to reduce chemo side effects and I will reduce dexa to 67m daily to not affect her blood sugar much. -We'll monitor her blood glucose and blood pressure closely during her chemotherapy treatment.  4. Ulcerative colitis -We previously discussed that chemotherapy would cause diarrhea and the patient's colitis may exacerbate during chemo  -She is not taking steroids for this. She is on mesalamine, will follow up with Dr. SFuller Plan -She had severe diarrhea after first cycle chemotherapy, was treated for ulcerative colitis flare, she will follow-up with Dr. SFuller Plannext week. -I previously advised the patient to keep herself adequatly hydrated and to take Imodium PRN. - I again advised the patient to take Lomotil and Imodium frequently as needed.  5. Arthritis  -s/p b/l hip replacement, last surgery in February 2018.   PLAN - Labs today are adequate for treatment tomorrow with chemotherapy. Docetaxel discontinued and replaced with Taxol 175 mg/m, carboplatin dosed reduced to AUC 4.5, pejeta dose reduction 10%, Herceptin full  dose. She will receive Neulasta on day 4.  -  No refills needed today. - Follow up with labs on 5/10 or 5/11. -Lab, flushed, follow-up and cycle 3 chemotherapy in 3 weeks.  No orders of the defined types were placed in this encounter.   All questions were answered. The patient knows to call the clinic with any problems, questions or concerns.  I spent 20 minutes counseling the patient face to face. The total time spent in the appointment was 25 minutes and more than 50% was on counseling.   This document serves as a record of services personally performed by Truitt Merle, MD. It was created on her behalf by Maryla Morrow, a trained medical scribe. The creation of this record is based on the scribe's personal observations and the provider's statements to them. This document has been checked and approved by the attending provider.   Truitt Merle, MD 05/31/2016

## 2016-05-31 ENCOUNTER — Other Ambulatory Visit: Payer: 59

## 2016-05-31 ENCOUNTER — Encounter: Payer: Self-pay | Admitting: Hematology

## 2016-05-31 ENCOUNTER — Ambulatory Visit (HOSPITAL_BASED_OUTPATIENT_CLINIC_OR_DEPARTMENT_OTHER): Payer: 59

## 2016-05-31 ENCOUNTER — Ambulatory Visit (HOSPITAL_BASED_OUTPATIENT_CLINIC_OR_DEPARTMENT_OTHER): Payer: 59 | Admitting: Hematology

## 2016-05-31 VITALS — BP 142/75 | HR 78 | Temp 98.4°F | Resp 20 | Ht 64.0 in | Wt 201.5 lb

## 2016-05-31 DIAGNOSIS — Z452 Encounter for adjustment and management of vascular access device: Secondary | ICD-10-CM

## 2016-05-31 DIAGNOSIS — C50412 Malignant neoplasm of upper-outer quadrant of left female breast: Secondary | ICD-10-CM

## 2016-05-31 DIAGNOSIS — E119 Type 2 diabetes mellitus without complications: Secondary | ICD-10-CM

## 2016-05-31 DIAGNOSIS — K5190 Ulcerative colitis, unspecified, without complications: Secondary | ICD-10-CM

## 2016-05-31 DIAGNOSIS — Z171 Estrogen receptor negative status [ER-]: Secondary | ICD-10-CM

## 2016-05-31 DIAGNOSIS — I10 Essential (primary) hypertension: Secondary | ICD-10-CM

## 2016-05-31 DIAGNOSIS — Z95828 Presence of other vascular implants and grafts: Secondary | ICD-10-CM

## 2016-05-31 LAB — CBC WITH DIFFERENTIAL/PLATELET
BASO%: 0 % (ref 0.0–2.0)
Basophils Absolute: 0 10*3/uL (ref 0.0–0.1)
EOS%: 0 % (ref 0.0–7.0)
Eosinophils Absolute: 0 10*3/uL (ref 0.0–0.5)
HEMATOCRIT: 38 % (ref 34.8–46.6)
HGB: 12 g/dL (ref 11.6–15.9)
LYMPH%: 11.3 % — ABNORMAL LOW (ref 14.0–49.7)
MCH: 27.9 pg (ref 25.1–34.0)
MCHC: 31.6 g/dL (ref 31.5–36.0)
MCV: 88.4 fL (ref 79.5–101.0)
MONO#: 0.4 10*3/uL (ref 0.1–0.9)
MONO%: 4.8 % (ref 0.0–14.0)
NEUT%: 83.9 % — ABNORMAL HIGH (ref 38.4–76.8)
NEUTROS ABS: 7.2 10*3/uL — AB (ref 1.5–6.5)
PLATELETS: 189 10*3/uL (ref 145–400)
RBC: 4.3 10*6/uL (ref 3.70–5.45)
RDW: 15.1 % — AB (ref 11.2–14.5)
WBC: 8.6 10*3/uL (ref 3.9–10.3)
lymph#: 1 10*3/uL (ref 0.9–3.3)

## 2016-05-31 LAB — COMPREHENSIVE METABOLIC PANEL
ALT: 18 U/L (ref 0–55)
ANION GAP: 10 meq/L (ref 3–11)
AST: 11 U/L (ref 5–34)
Albumin: 3 g/dL — ABNORMAL LOW (ref 3.5–5.0)
Alkaline Phosphatase: 90 U/L (ref 40–150)
BILIRUBIN TOTAL: 0.3 mg/dL (ref 0.20–1.20)
BUN: 16.7 mg/dL (ref 7.0–26.0)
CALCIUM: 9.1 mg/dL (ref 8.4–10.4)
CO2: 25 meq/L (ref 22–29)
CREATININE: 0.8 mg/dL (ref 0.6–1.1)
Chloride: 105 mEq/L (ref 98–109)
EGFR: 85 mL/min/{1.73_m2} — ABNORMAL LOW (ref >90)
Glucose: 168 mg/dl — ABNORMAL HIGH (ref 70–140)
Potassium: 3.6 mEq/L (ref 3.5–5.1)
Sodium: 140 mEq/L (ref 136–145)
Total Protein: 6.3 g/dL — ABNORMAL LOW (ref 6.4–8.3)

## 2016-05-31 MED ORDER — SODIUM CHLORIDE 0.9% FLUSH
10.0000 mL | Freq: Once | INTRAVENOUS | Status: AC
  Administered 2016-05-31: 10 mL
  Filled 2016-05-31: qty 10

## 2016-05-31 MED ORDER — HEPARIN SOD (PORK) LOCK FLUSH 100 UNIT/ML IV SOLN
500.0000 [IU] | Freq: Once | INTRAVENOUS | Status: AC
  Administered 2016-05-31: 500 [IU]
  Filled 2016-05-31: qty 5

## 2016-06-01 ENCOUNTER — Other Ambulatory Visit: Payer: 59

## 2016-06-01 ENCOUNTER — Encounter: Payer: Self-pay | Admitting: *Deleted

## 2016-06-01 ENCOUNTER — Telehealth: Payer: Self-pay | Admitting: *Deleted

## 2016-06-01 ENCOUNTER — Ambulatory Visit (HOSPITAL_BASED_OUTPATIENT_CLINIC_OR_DEPARTMENT_OTHER): Payer: 59

## 2016-06-01 ENCOUNTER — Telehealth: Payer: Self-pay | Admitting: Hematology

## 2016-06-01 VITALS — BP 139/78 | HR 78 | Temp 98.8°F | Resp 18 | Ht 64.0 in

## 2016-06-01 DIAGNOSIS — C50412 Malignant neoplasm of upper-outer quadrant of left female breast: Secondary | ICD-10-CM | POA: Diagnosis not present

## 2016-06-01 DIAGNOSIS — Z5112 Encounter for antineoplastic immunotherapy: Secondary | ICD-10-CM

## 2016-06-01 DIAGNOSIS — Z5111 Encounter for antineoplastic chemotherapy: Secondary | ICD-10-CM

## 2016-06-01 DIAGNOSIS — Z171 Estrogen receptor negative status [ER-]: Principal | ICD-10-CM

## 2016-06-01 MED ORDER — ACETAMINOPHEN 325 MG PO TABS
650.0000 mg | ORAL_TABLET | Freq: Once | ORAL | Status: AC
  Administered 2016-06-01: 650 mg via ORAL

## 2016-06-01 MED ORDER — DEXAMETHASONE SODIUM PHOSPHATE 10 MG/ML IJ SOLN
INTRAMUSCULAR | Status: AC
  Filled 2016-06-01: qty 1

## 2016-06-01 MED ORDER — ACETAMINOPHEN 325 MG PO TABS
ORAL_TABLET | ORAL | Status: AC
  Filled 2016-06-01: qty 2

## 2016-06-01 MED ORDER — PACLITAXEL CHEMO INJECTION 300 MG/50ML
175.0000 mg/m2 | Freq: Once | INTRAVENOUS | Status: AC
  Administered 2016-06-01: 372 mg via INTRAVENOUS
  Filled 2016-06-01: qty 62

## 2016-06-01 MED ORDER — PALONOSETRON HCL INJECTION 0.25 MG/5ML
INTRAVENOUS | Status: AC
  Filled 2016-06-01: qty 5

## 2016-06-01 MED ORDER — FAMOTIDINE IN NACL 20-0.9 MG/50ML-% IV SOLN
INTRAVENOUS | Status: AC
  Filled 2016-06-01: qty 50

## 2016-06-01 MED ORDER — PALONOSETRON HCL INJECTION 0.25 MG/5ML
0.2500 mg | Freq: Once | INTRAVENOUS | Status: AC
  Administered 2016-06-01: 0.25 mg via INTRAVENOUS

## 2016-06-01 MED ORDER — FAMOTIDINE IN NACL 20-0.9 MG/50ML-% IV SOLN
20.0000 mg | Freq: Once | INTRAVENOUS | Status: AC
  Administered 2016-06-01: 20 mg via INTRAVENOUS

## 2016-06-01 MED ORDER — LORATADINE 10 MG PO TABS
10.0000 mg | ORAL_TABLET | Freq: Once | ORAL | Status: AC
  Administered 2016-06-01: 10 mg via ORAL
  Filled 2016-06-01: qty 1

## 2016-06-01 MED ORDER — SODIUM CHLORIDE 0.9% FLUSH
10.0000 mL | INTRAVENOUS | Status: AC | PRN
  Filled 2016-06-01: qty 10

## 2016-06-01 MED ORDER — PERTUZUMAB CHEMO INJECTION 420 MG/14ML
380.0000 mg | Freq: Once | INTRAVENOUS | Status: AC
  Administered 2016-06-01: 390 mg via INTRAVENOUS
  Filled 2016-06-01: qty 13

## 2016-06-01 MED ORDER — SODIUM CHLORIDE 0.9 % IV SOLN
6.0000 mg/kg | Freq: Once | INTRAVENOUS | Status: AC
  Administered 2016-06-01: 588 mg via INTRAVENOUS
  Filled 2016-06-01: qty 28

## 2016-06-01 MED ORDER — SODIUM CHLORIDE 0.9 % IV SOLN
Freq: Once | INTRAVENOUS | Status: AC
  Administered 2016-06-01: 10:00:00 via INTRAVENOUS

## 2016-06-01 MED ORDER — SODIUM CHLORIDE 0.9 % IV SOLN
650.0000 mg | Freq: Once | INTRAVENOUS | Status: AC
  Administered 2016-06-01: 650 mg via INTRAVENOUS
  Filled 2016-06-01: qty 65

## 2016-06-01 MED ORDER — FAMOTIDINE IN NACL 20-0.9 MG/50ML-% IV SOLN: 20.0000 mg | Freq: Two times a day (BID) | INTRAVENOUS | Status: DC

## 2016-06-01 MED ORDER — HEPARIN SOD (PORK) LOCK FLUSH 100 UNIT/ML IV SOLN
500.0000 [IU] | Freq: Once | INTRAVENOUS | Status: AC | PRN
  Filled 2016-06-01: qty 5

## 2016-06-01 MED ORDER — LORATADINE 10 MG PO TABS: 10.0000 mg | ORAL_TABLET | Freq: Every day | ORAL | Status: DC

## 2016-06-01 MED ORDER — DEXAMETHASONE SODIUM PHOSPHATE 10 MG/ML IJ SOLN
10.0000 mg | Freq: Once | INTRAMUSCULAR | Status: AC
  Administered 2016-06-01: 10 mg via INTRAVENOUS

## 2016-06-01 MED ORDER — DIPHENHYDRAMINE HCL 25 MG PO CAPS: 50.0000 mg | ORAL_CAPSULE | Freq: Once | ORAL | Status: DC

## 2016-06-01 NOTE — Patient Instructions (Addendum)
[facility] Discharge Instructions for Patients Receiving Chemotherapy  Today you received the following chemotherapy agents: herceptin, Perjeta, Taxol and Carboplatin   To help prevent nausea and vomiting after your treatment, we encourage you to take your nausea medication as directed.    If you develop nausea and vomiting that is not controlled by your nausea medication, call the clinic.   BELOW ARE SYMPTOMS THAT SHOULD BE REPORTED IMMEDIATELY:  *FEVER GREATER THAN 100.5 F  *CHILLS WITH OR WITHOUT FEVER  NAUSEA AND VOMITING THAT IS NOT CONTROLLED WITH YOUR NAUSEA MEDICATION  *UNUSUAL SHORTNESS OF BREATH  *UNUSUAL BRUISING OR BLEEDING  TENDERNESS IN MOUTH AND THROAT WITH OR WITHOUT PRESENCE OF ULCERS  *URINARY PROBLEMS  *BOWEL PROBLEMS  UNUSUAL RASH Items with * indicate a potential emergency and should be followed up as soon as possible.  Feel free to call the clinic you have any questions or concerns. The clinic phone number is [phone].  Please show the [facility] at check-in to the Emergency Department and triage nurse.    Paclitaxel injection What is this medicine? PACLITAXEL (PAK li TAX el) is a chemotherapy drug. It targets fast dividing cells, like cancer cells, and causes these cells to die. This medicine is used to treat ovarian cancer, breast cancer, and other cancers. This medicine may be used for other purposes; ask your health care provider or pharmacist if you have questions. COMMON BRAND NAME(S): Onxol, Taxol What should I tell my health care provider before I take this medicine? They need to know if you have any of these conditions: -blood disorders -irregular heartbeat -infection (especially a virus infection such as chickenpox, cold sores, or herpes) -liver disease -previous or ongoing radiation therapy -an unusual or allergic reaction to paclitaxel, alcohol, polyoxyethylated castor oil, other chemotherapy agents, other  medicines, foods, dyes, or preservatives -pregnant or trying to get pregnant -breast-feeding How should I use this medicine? This drug is given as an infusion into a vein. It is administered in a hospital or clinic by a specially trained health care professional. Talk to your pediatrician regarding the use of this medicine in children. Special care may be needed. Overdosage: If you think you have taken too much of this medicine contact a poison control center or emergency room at once. NOTE: This medicine is only for you. Do not share this medicine with others. What if I miss a dose? It is important not to miss your dose. Call your doctor or health care professional if you are unable to keep an appointment. What may interact with this medicine? Do not take this medicine with any of the following medications: -disulfiram -metronidazole This medicine may also interact with the following medications: -cyclosporine -diazepam -ketoconazole -medicines to increase blood counts like filgrastim, pegfilgrastim, sargramostim -other chemotherapy drugs like cisplatin, doxorubicin, epirubicin, etoposide, teniposide, vincristine -quinidine -testosterone -vaccines -verapamil Talk to your doctor or health care professional before taking any of these medicines: -acetaminophen -aspirin -ibuprofen -ketoprofen -naproxen This list may not describe all possible interactions. Give your health care provider a list of all the medicines, herbs, non-prescription drugs, or dietary supplements you use. Also tell them if you smoke, drink alcohol, or use illegal drugs. Some items may interact with your medicine. What should I watch for while using this medicine? Your condition will be monitored carefully while you are receiving this medicine. You will need important blood work done while you are taking this medicine. This medicine can cause serious allergic reactions. To reduce  your risk you will need to take other  medicine(s) before treatment with this medicine. If you experience allergic reactions like skin rash, itching or hives, swelling of the face, lips, or tongue, tell your doctor or health care professional right away. In some cases, you may be given additional medicines to help with side effects. Follow all directions for their use. This drug may make you feel generally unwell. This is not uncommon, as chemotherapy can affect healthy cells as well as cancer cells. Report any side effects. Continue your course of treatment even though you feel ill unless your doctor tells you to stop. Call your doctor or health care professional for advice if you get a fever, chills or sore throat, or other symptoms of a cold or flu. Do not treat yourself. This drug decreases your body's ability to fight infections. Try to avoid being around people who are sick. This medicine may increase your risk to bruise or bleed. Call your doctor or health care professional if you notice any unusual bleeding. Be careful brushing and flossing your teeth or using a toothpick because you may get an infection or bleed more easily. If you have any dental work done, tell your dentist you are receiving this medicine. Avoid taking products that contain aspirin, acetaminophen, ibuprofen, naproxen, or ketoprofen unless instructed by your doctor. These medicines may hide a fever. Do not become pregnant while taking this medicine. Women should inform their doctor if they wish to become pregnant or think they might be pregnant. There is a potential for serious side effects to an unborn child. Talk to your health care professional or pharmacist for more information. Do not breast-feed an infant while taking this medicine. Men are advised not to father a child while receiving this medicine. This product may contain alcohol. Ask your pharmacist or healthcare provider if this medicine contains alcohol. Be sure to tell all healthcare providers you are  taking this medicine. Certain medicines, like metronidazole and disulfiram, can cause an unpleasant reaction when taken with alcohol. The reaction includes flushing, headache, nausea, vomiting, sweating, and increased thirst. The reaction can last from 30 minutes to several hours. What side effects may I notice from receiving this medicine? Side effects that you should report to your doctor or health care professional as soon as possible: -allergic reactions like skin rash, itching or hives, swelling of the face, lips, or tongue -low blood counts - This drug may decrease the number of white blood cells, red blood cells and platelets. You may be at increased risk for infections and bleeding. -signs of infection - fever or chills, cough, sore throat, pain or difficulty passing urine -signs of decreased platelets or bleeding - bruising, pinpoint red spots on the skin, black, tarry stools, nosebleeds -signs of decreased red blood cells - unusually weak or tired, fainting spells, lightheadedness -breathing problems -chest pain -high or low blood pressure -mouth sores -nausea and vomiting -pain, swelling, redness or irritation at the injection site -pain, tingling, numbness in the hands or feet -slow or irregular heartbeat -swelling of the ankle, feet, hands Side effects that usually do not require medical attention (report to your doctor or health care professional if they continue or are bothersome): -bone pain -complete hair loss including hair on your head, underarms, pubic hair, eyebrows, and eyelashes -changes in the color of fingernails -diarrhea -loosening of the fingernails -loss of appetite -muscle or joint pain -red flush to skin -sweating This list may not describe all possible side effects. Call   your doctor for medical advice about side effects. You may report side effects to FDA at 1-800-FDA-1088. Where should I keep my medicine? This drug is given in a hospital or clinic and  will not be stored at home. NOTE: This sheet is a summary. It may not cover all possible information. If you have questions about this medicine, talk to your doctor, pharmacist, or health care provider.  2018 Elsevier/Gold Standard (2014-11-16 19:58:00)

## 2016-06-01 NOTE — Telephone Encounter (Signed)
Appointments scheduled per 5.4.18 LOS. Patient given AVS report and calendars with future scheduled appointments.

## 2016-06-01 NOTE — Progress Notes (Unsigned)
Pt tolerated infusion well.

## 2016-06-01 NOTE — Telephone Encounter (Signed)
Called & left message to check on pt post chemotherapy with taxol.  No answer but message left to call us with any problems.

## 2016-06-01 NOTE — Telephone Encounter (Signed)
-----   Message from Egbert Garibaldi, RN sent at 06/01/2016  4:42 PM EDT ----- Regarding: Dr. Allen Kell chemo f/u call  Pt of [name], first time Taxol, pt tolerated well

## 2016-06-04 ENCOUNTER — Ambulatory Visit (HOSPITAL_BASED_OUTPATIENT_CLINIC_OR_DEPARTMENT_OTHER): Payer: 59

## 2016-06-04 ENCOUNTER — Ambulatory Visit: Payer: 59

## 2016-06-04 VITALS — BP 108/63 | HR 70 | Temp 99.2°F | Resp 18

## 2016-06-04 DIAGNOSIS — C50412 Malignant neoplasm of upper-outer quadrant of left female breast: Secondary | ICD-10-CM | POA: Diagnosis not present

## 2016-06-04 DIAGNOSIS — Z5189 Encounter for other specified aftercare: Secondary | ICD-10-CM

## 2016-06-04 DIAGNOSIS — Z171 Estrogen receptor negative status [ER-]: Secondary | ICD-10-CM

## 2016-06-04 MED ORDER — PEGFILGRASTIM INJECTION 6 MG/0.6ML ~~LOC~~
6.0000 mg | PREFILLED_SYRINGE | Freq: Once | SUBCUTANEOUS | Status: AC
  Administered 2016-06-04: 6 mg via SUBCUTANEOUS
  Filled 2016-06-04: qty 0.6

## 2016-06-04 NOTE — Patient Instructions (Signed)
Pegfilgrastim injection What is this medicine? PEGFILGRASTIM (PEG fil gra stim) is a long-acting granulocyte colony-stimulating factor that stimulates the growth of neutrophils, a type of white blood cell important in the body's fight against infection. It is used to reduce the incidence of fever and infection in patients with certain types of cancer who are receiving chemotherapy that affects the bone marrow, and to increase survival after being exposed to high doses of radiation. This medicine may be used for other purposes; ask your health care provider or pharmacist if you have questions. COMMON BRAND NAME(S): Neulasta What should I tell my health care provider before I take this medicine? They need to know if you have any of these conditions: -kidney disease -latex allergy -ongoing radiation therapy -sickle cell disease -skin reactions to acrylic adhesives (On-Body Injector only) -an unusual or allergic reaction to pegfilgrastim, filgrastim, other medicines, foods, dyes, or preservatives -pregnant or trying to get pregnant -breast-feeding How should I use this medicine? This medicine is for injection under the skin. If you get this medicine at home, you will be taught how to prepare and give the pre-filled syringe or how to use the On-body Injector. Refer to the patient Instructions for Use for detailed instructions. Use exactly as directed. Tell your healthcare provider immediately if you suspect that the On-body Injector may not have performed as intended or if you suspect the use of the On-body Injector resulted in a missed or partial dose. It is important that you put your used needles and syringes in a special sharps container. Do not put them in a trash can. If you do not have a sharps container, call your pharmacist or healthcare provider to get one. Talk to your pediatrician regarding the use of this medicine in children. While this drug may be prescribed for selected conditions,  precautions do apply. Overdosage: If you think you have taken too much of this medicine contact a poison control center or emergency room at once. NOTE: This medicine is only for you. Do not share this medicine with others. What if I miss a dose? It is important not to miss your dose. Call your doctor or health care professional if you miss your dose. If you miss a dose due to an On-body Injector failure or leakage, a new dose should be administered as soon as possible using a single prefilled syringe for manual use. What may interact with this medicine? Interactions have not been studied. Give your health care provider a list of all the medicines, herbs, non-prescription drugs, or dietary supplements you use. Also tell them if you smoke, drink alcohol, or use illegal drugs. Some items may interact with your medicine. This list may not describe all possible interactions. Give your health care provider a list of all the medicines, herbs, non-prescription drugs, or dietary supplements you use. Also tell them if you smoke, drink alcohol, or use illegal drugs. Some items may interact with your medicine. What should I watch for while using this medicine? You may need blood work done while you are taking this medicine. If you are going to need a MRI, CT scan, or other procedure, tell your doctor that you are using this medicine (On-Body Injector only). What side effects may I notice from receiving this medicine? Side effects that you should report to your doctor or health care professional as soon as possible: -allergic reactions like skin rash, itching or hives, swelling of the face, lips, or tongue -dizziness -fever -pain, redness, or irritation at site   where injected -pinpoint red spots on the skin -red or dark-brown urine -shortness of breath or breathing problems -stomach or side pain, or pain at the shoulder -swelling -tiredness -trouble passing urine or change in the amount of urine Side  effects that usually do not require medical attention (report to your doctor or health care professional if they continue or are bothersome): -bone pain -muscle pain This list may not describe all possible side effects. Call your doctor for medical advice about side effects. You may report side effects to FDA at 1-800-FDA-1088. Where should I keep my medicine? Keep out of the reach of children. Store pre-filled syringes in a refrigerator between 2 and 8 degrees C (36 and 46 degrees F). Do not freeze. Keep in carton to protect from light. Throw away this medicine if it is left out of the refrigerator for more than 48 hours. Throw away any unused medicine after the expiration date. NOTE: This sheet is a summary. It may not cover all possible information. If you have questions about this medicine, talk to your doctor, pharmacist, or health care provider.  2018 Elsevier/Gold Standard (2016-01-12 12:58:03)  

## 2016-06-05 ENCOUNTER — Ambulatory Visit (INDEPENDENT_AMBULATORY_CARE_PROVIDER_SITE_OTHER): Payer: 59 | Admitting: Gastroenterology

## 2016-06-05 ENCOUNTER — Encounter: Payer: Self-pay | Admitting: Gastroenterology

## 2016-06-05 VITALS — BP 112/74 | Ht 64.0 in | Wt 199.0 lb

## 2016-06-05 DIAGNOSIS — K5190 Ulcerative colitis, unspecified, without complications: Secondary | ICD-10-CM

## 2016-06-05 MED ORDER — PREDNISONE 10 MG PO TABS: 10.0000 mg | ORAL_TABLET | ORAL | 0 refills | Status: DC

## 2016-06-05 NOTE — Progress Notes (Signed)
06/05/2016 Brandi Dickson [id number] 03/31/1960   HISTORY OF PRESENT ILLNESS:  This is a 56 year old female who is here for hospital follow-up. She was hospitalized from April 20-April 26 with an ulcerative colitis flare. During her hospital stay her mesalamine was increased to 4.8 g daily. She was put on IV steroids and then transitioned to prednisone 40 mg daily at discharge. Her flare was thought to either be from bad food that she ate at a picnic since her husband also had gotten sick versus chemotherapy. She has stage IIa breast cancer and has been receiving chemotherapy for that under the direction of [name].  In regards to her colitis she said that she is doing very well. She says that she is still having diarrhea that just started again following her chemotherapy session this past Friday. She is quite sure that this diarrhea is related to the chemotherapy rather than her colitis as she is no longer having any blood or abdominal pain. She is asking for a slow taper of prednisone for fear of her colitis flaring back up again while she continues her chemotherapy. She gets one treatment every 3 weeks and has 3 or 4 treatments left.  Is using Imodium alternating with Lomotil for this diarrhea, which helps.   Past Medical History:  Diagnosis Date  . Arthritis   . Asthma    triggered with [name] perfumes and cigarette smoke  . Cancer ([place])   . Colitis   . Diabetes mellitus without complication ([place])   . Hypertension   . Neuropathy    Past Surgical History:  Procedure Laterality Date  . ABDOMINAL HYSTERECTOMY    . DILATION AND CURETTAGE OF UTERUS    . PORTACATH PLACEMENT Right 05/08/2016   Procedure: INSERTION PORT-A-CATH WITH [place];  Surgeon: [name], MD;  Location: [facility];  Service: General;  Laterality: Right;  . TONSILLECTOMY    . TOTAL HIP ARTHROPLASTY Right   . TOTAL HIP ARTHROPLASTY Left 03/06/2016   Procedure: LEFT TOTAL HIP ARTHROPLASTY ANTERIOR  APPROACH;  Surgeon: [name], MD;  Location: [facility];  Service: Orthopedics;  Laterality: Left;    reports that she quit smoking about 13 years ago. Her smoking use included Cigarettes. She has a 29.00 pack-year smoking history. She has never used smokeless tobacco. She reports that she does not drink alcohol or use drugs. family history includes Breast cancer in her cousin, maternal aunt, and maternal aunt; Colon cancer in her father; Melanoma in her brother; Stomach cancer in her paternal uncle and paternal uncle; Thyroid cancer in her brother. Allergies  Allergen Reactions  . Lisinopril Palpitations  . Augmentin [Amoxicillin-Pot Clavulanate] Other (See Comments)    sts gives her a yeast infection Has patient had a PCN reaction causing immediate rash, facial/tongue/throat swelling, SOB or lightheadedness with hypotension: no Has patient had a PCN reaction causing severe rash involving mucus membranes or skin necrosis: no Has patient had a PCN reaction that required hospitalization no Has patient had a PCN reaction occurring within the last 10 years: unknown If all of the above answers are "NO", then may proceed with Cephalosporin use.   . Benadryl [Diphenhydramine] Itching and Anxiety    Per pt: "Makes my skin crawl"; makes pt sensitive to touch      Outpatient Encounter Prescriptions as of 06/05/2016  Medication Sig  . albuterol (PROVENTIL HFA;VENTOLIN HFA) 108 (90 Base) MCG/ACT inhaler Inhale 1-2 puffs into the lungs every 6 (six) hours as needed  for wheezing or shortness of breath.  . ASPIRIN 81 PO Take by mouth.  . dexamethasone (DECADRON) 4 MG tablet Take 1 tablet (4 mg total) by mouth daily. Start the day before Taxotere. Then again the day after chemo for 3 days.  [name] Kitchen dicyclomine (BENTYL) 20 MG tablet Take 1 tablet (20 mg total) by mouth 4 (four) times daily -  before meals and at bedtime.  . diphenoxylate-atropine (LOMOTIL) 2.5-0.025 MG tablet Take one to two tablets every six  hours as needed for diarrhea  . ferrous sulfate (FERROUSUL) 325 (65 FE) MG tablet Take 1 tablet (325 mg total) by mouth 3 (three) times daily with meals.  . gabapentin (NEURONTIN) 100 MG capsule Take 200 mg by mouth at bedtime.   . hydrocortisone (ANUSOL-HC) 25 MG suppository Place 1 suppository (25 mg total) rectally 2 (two) times daily.  [name] Kitchen lidocaine-prilocaine (EMLA) cream Apply 1 application to skin 1.5 to 2 hrs before use.  Cover to secure cream with plastic wrap.  . loperamide (IMODIUM) 1 MG/5ML solution Take 3 mg by mouth as needed for diarrhea or loose stools.  [name] Kitchen LORazepam (ATIVAN) 0.5 MG tablet Take 1 tablet (0.5 mg total) by mouth once as needed for anxiety.  . Mesalamine (ASACOL HD) 800 MG TBEC Take 2 tablets (1,600 mg total) by mouth 3 (three) times daily.  . metFORMIN (GLUCOPHAGE-XR) 500 MG 24 hr tablet Take 1,000 mg by mouth at bedtime.  . metoprolol (LOPRESSOR) 50 MG tablet Take 50 mg by mouth at bedtime.   . niacin 250 MG tablet Take 250 mg by mouth at bedtime.   . ondansetron (ZOFRAN) 8 MG tablet Take 1 tablet (8 mg total) by mouth 2 (two) times daily as needed for refractory nausea / vomiting. Start on day 3 after chemo.  . predniSONE (DELTASONE) 20 MG tablet Take 2 tablets (40 mg total) by mouth daily with breakfast.  . prochlorperazine (COMPAZINE) 10 MG tablet Take 1 tablet (10 mg total) by mouth every 6 (six) hours as needed (Nausea or vomiting).  [name] Kitchen Specialty Vitamins Products (VITAMINS FOR THE HAIR) TABS Take 2 tablets by mouth 2 (two) times daily.  . traMADol (ULTRAM) 50 MG tablet Take 1-2 tablets (50-100 mg total) by mouth every 6 (six) hours as needed.  . triamterene-hydrochlorothiazide (MAXZIDE) 75-50 MG per tablet Take 0.5 tablets by mouth daily.    Facility-Administered Encounter Medications as of 06/05/2016  Medication  . heparin lock flush 100 unit/mL  . sodium chloride flush (NS) 0.9 % injection 10 mL     REVIEW OF SYSTEMS  : All other systems reviewed and  negative except where noted in the History of Present Illness.   PHYSICAL EXAM: BP 112/74   Ht 5\' 4"  (1.626 m)   Wt 199 lb (90.3 kg)   BMI 34.16 kg/m  General: Well developed white female in no acute distress Head: Normocephalic and atraumatic Eyes:  Sclerae anicteric, conjunctiva pink. Ears: Normal auditory acuity Lungs: Clear throughout to auscultation; no increased WOB. Heart: Regular rate and rhythm Abdomen: Soft, non-distended. No masses or hepatomegaly noted. Normal bowel sounds Musculoskeletal: Symmetrical with no gross deformities  Skin: No lesions on visible extremities Extremities: No edema  Neurological: Alert oriented x 4, grossly non-focal Psychological:  Alert and cooperative. Normal mood and affect  ASSESSMENT AND PLAN: -Ulcerative colitis:  Much better on increased mesalamine and prednisone.  Will leave Asacol at 4.8 grams daily.  Will taper prednisone by 10 mg every two weeks.  Will bring her back  for follow-up in about 8 weeks at which point she will be off of prednisone.   CC:  Orpah Melter, MD

## 2016-06-05 NOTE — Patient Instructions (Signed)
Prednisone taper  Starting Friday 06/08/16 30 mg x 2 weeks 20 mg x 2 weeks 10 mg x 2 weeks then stop

## 2016-06-06 NOTE — Progress Notes (Signed)
Reviewed and agree with management plan.  Shaunika Italiano T. Ernst Cumpston, MD FACG 

## 2016-06-07 ENCOUNTER — Emergency Department (HOSPITAL_COMMUNITY): Payer: 59

## 2016-06-07 ENCOUNTER — Other Ambulatory Visit: Payer: Self-pay

## 2016-06-07 ENCOUNTER — Telehealth: Payer: Self-pay | Admitting: Gastroenterology

## 2016-06-07 ENCOUNTER — Encounter (HOSPITAL_COMMUNITY): Payer: Self-pay | Admitting: Obstetrics and Gynecology

## 2016-06-07 ENCOUNTER — Observation Stay (HOSPITAL_COMMUNITY)
Admission: EM | Admit: 2016-06-07 | Discharge: 2016-06-09 | DRG: 385 | Disposition: A | Discharge status: Home / Self Care | Payer: 59 | Attending: Internal Medicine | Admitting: Internal Medicine

## 2016-06-07 ENCOUNTER — Other Ambulatory Visit: Payer: Self-pay | Admitting: Oncology

## 2016-06-07 DIAGNOSIS — Z803 Family history of malignant neoplasm of breast: Secondary | ICD-10-CM | POA: Diagnosis not present

## 2016-06-07 DIAGNOSIS — C50412 Malignant neoplasm of upper-outer quadrant of left female breast: Secondary | ICD-10-CM | POA: Diagnosis not present

## 2016-06-07 DIAGNOSIS — J45909 Unspecified asthma, uncomplicated: Secondary | ICD-10-CM | POA: Diagnosis present

## 2016-06-07 DIAGNOSIS — Z7982 Long term (current) use of aspirin: Secondary | ICD-10-CM

## 2016-06-07 DIAGNOSIS — Z7952 Long term (current) use of systemic steroids: Secondary | ICD-10-CM | POA: Diagnosis not present

## 2016-06-07 DIAGNOSIS — Z96643 Presence of artificial hip joint, bilateral: Secondary | ICD-10-CM | POA: Diagnosis present

## 2016-06-07 DIAGNOSIS — Z171 Estrogen receptor negative status [ER-]: Secondary | ICD-10-CM | POA: Diagnosis not present

## 2016-06-07 DIAGNOSIS — Z87891 Personal history of nicotine dependence: Secondary | ICD-10-CM | POA: Diagnosis not present

## 2016-06-07 DIAGNOSIS — R000 Tachycardia, unspecified: Secondary | ICD-10-CM | POA: Diagnosis present

## 2016-06-07 DIAGNOSIS — D61810 Antineoplastic chemotherapy induced pancytopenia: Secondary | ICD-10-CM | POA: Diagnosis not present

## 2016-06-07 DIAGNOSIS — I4581 Long QT syndrome: Secondary | ICD-10-CM | POA: Diagnosis not present

## 2016-06-07 DIAGNOSIS — Z90710 Acquired absence of both cervix and uterus: Secondary | ICD-10-CM

## 2016-06-07 DIAGNOSIS — K51911 Ulcerative colitis, unspecified with rectal bleeding: Secondary | ICD-10-CM | POA: Diagnosis not present

## 2016-06-07 DIAGNOSIS — Z853 Personal history of malignant neoplasm of breast: Secondary | ICD-10-CM

## 2016-06-07 DIAGNOSIS — E876 Hypokalemia: Secondary | ICD-10-CM | POA: Diagnosis present

## 2016-06-07 DIAGNOSIS — Z888 Allergy status to other drugs, medicaments and biological substances status: Secondary | ICD-10-CM

## 2016-06-07 DIAGNOSIS — K921 Melena: Secondary | ICD-10-CM

## 2016-06-07 DIAGNOSIS — Z881 Allergy status to other antibiotic agents status: Secondary | ICD-10-CM | POA: Diagnosis not present

## 2016-06-07 DIAGNOSIS — K625 Hemorrhage of anus and rectum: Secondary | ICD-10-CM

## 2016-06-07 DIAGNOSIS — Z79899 Other long term (current) drug therapy: Secondary | ICD-10-CM | POA: Diagnosis not present

## 2016-06-07 DIAGNOSIS — K5190 Ulcerative colitis, unspecified, without complications: Secondary | ICD-10-CM

## 2016-06-07 DIAGNOSIS — K769 Liver disease, unspecified: Secondary | ICD-10-CM | POA: Diagnosis not present

## 2016-06-07 DIAGNOSIS — Z7984 Long term (current) use of oral hypoglycemic drugs: Secondary | ICD-10-CM | POA: Diagnosis not present

## 2016-06-07 DIAGNOSIS — D509 Iron deficiency anemia, unspecified: Secondary | ICD-10-CM | POA: Diagnosis present

## 2016-06-07 DIAGNOSIS — I10 Essential (primary) hypertension: Secondary | ICD-10-CM | POA: Diagnosis not present

## 2016-06-07 DIAGNOSIS — E119 Type 2 diabetes mellitus without complications: Secondary | ICD-10-CM | POA: Diagnosis not present

## 2016-06-07 DIAGNOSIS — T451X5A Adverse effect of antineoplastic and immunosuppressive drugs, initial encounter: Secondary | ICD-10-CM

## 2016-06-07 DIAGNOSIS — K922 Gastrointestinal hemorrhage, unspecified: Secondary | ICD-10-CM | POA: Diagnosis present

## 2016-06-07 LAB — CBC WITH DIFFERENTIAL/PLATELET
Basophils Absolute: 0 10*3/uL (ref 0.0–0.1)
Basophils Absolute: 0 10*3/uL (ref 0.0–0.1)
Basophils Relative: 0 %
Basophils Relative: 0 %
Eosinophils Absolute: 0 10*3/uL (ref 0.0–0.7)
Eosinophils Absolute: 0 10*3/uL (ref 0.0–0.7)
Eosinophils Relative: 0 %
Eosinophils Relative: 1 %
HCT: 34.8 % — ABNORMAL LOW (ref 36.0–46.0)
HCT: 33.4 % — ABNORMAL LOW (ref 36.0–46.0)
Hemoglobin: 11.5 g/dL — ABNORMAL LOW (ref 12.0–15.0)
Hemoglobin: 11 g/dL — ABNORMAL LOW (ref 12.0–15.0)
Lymphocytes Relative: 70 %
Lymphocytes Relative: 68 %
Lymphs Abs: 0.6 10*3/uL — ABNORMAL LOW (ref 0.7–4.0)
Lymphs Abs: 0.9 10*3/uL (ref 0.7–4.0)
MCH: 28.8 pg (ref 26.0–34.0)
MCH: 28.5 pg (ref 26.0–34.0)
MCHC: 33 g/dL (ref 30.0–36.0)
MCHC: 32.9 g/dL (ref 30.0–36.0)
MCV: 87 fL (ref 78.0–100.0)
MCV: 86.5 fL (ref 78.0–100.0)
Monocytes Absolute: 0.1 10*3/uL (ref 0.1–1.0)
Monocytes Absolute: 0.2 10*3/uL (ref 0.1–1.0)
Monocytes Relative: 15 %
Monocytes Relative: 17 %
Neutro Abs: 0.1 10*3/uL — ABNORMAL LOW (ref 1.7–7.7)
Neutro Abs: 0.2 10*3/uL — ABNORMAL LOW (ref 1.7–7.7)
Neutrophils Relative %: 15 %
Neutrophils Relative %: 14 %
Platelets: 78 10*3/uL — ABNORMAL LOW (ref 150–400)
Platelets: 79 10*3/uL — ABNORMAL LOW (ref 150–400)
RBC: 4 MIL/uL (ref 3.87–5.11)
RBC: 3.86 MIL/uL — ABNORMAL LOW (ref 3.87–5.11)
RDW: 14.6 % (ref 11.5–15.5)
RDW: 14.7 % (ref 11.5–15.5)
WBC: 0.8 10*3/uL — CL (ref 4.0–10.5)
WBC: 1.3 10*3/uL — CL (ref 4.0–10.5)

## 2016-06-07 LAB — I-STAT CG4 LACTIC ACID, ED: Lactic Acid, Venous: 2.31 mmol/L (ref 0.5–1.9)

## 2016-06-07 LAB — COMPREHENSIVE METABOLIC PANEL
ALT: 20 U/L (ref 14–54)
AST: 19 U/L (ref 15–41)
Albumin: 3 g/dL — ABNORMAL LOW (ref 3.5–5.0)
Alkaline Phosphatase: 71 U/L (ref 38–126)
Anion gap: 9 (ref 5–15)
BUN: 17 mg/dL (ref 6–20)
CO2: 23 mmol/L (ref 22–32)
Calcium: 8.7 mg/dL — ABNORMAL LOW (ref 8.9–10.3)
Chloride: 103 mmol/L (ref 101–111)
Creatinine, Ser: 0.78 mg/dL (ref 0.44–1.00)
GFR calc Af Amer: >60 mL/min (ref >60)
GFR calc non Af Amer: >60 mL/min (ref >60)
Glucose, Bld: 196 mg/dL — ABNORMAL HIGH (ref 65–99)
Potassium: 3.5 mmol/L (ref 3.5–5.1)
Sodium: 135 mmol/L (ref 135–145)
Total Bilirubin: 0.8 mg/dL (ref 0.3–1.2)
Total Protein: 6.3 g/dL — ABNORMAL LOW (ref 6.5–8.1)

## 2016-06-07 LAB — TYPE AND SCREEN
ABO/RH(D): O POS
Antibody Screen: NEGATIVE

## 2016-06-07 LAB — GLUCOSE, CAPILLARY: Glucose-Capillary: 149 mg/dL — ABNORMAL HIGH (ref 65–99)

## 2016-06-07 LAB — SEDIMENTATION RATE: Sed Rate: 45 mm/hr — ABNORMAL HIGH (ref 0–22)

## 2016-06-07 LAB — LIPASE, BLOOD: Lipase: 18 U/L (ref 11–51)

## 2016-06-07 MED ORDER — PROCHLORPERAZINE MALEATE 10 MG PO TABS: 10.0000 mg | ORAL_TABLET | Freq: Four times a day (QID) | ORAL | Status: DC | PRN

## 2016-06-07 MED ORDER — TRIAMTERENE-HCTZ 37.5-25 MG PO TABS
1.0000 | ORAL_TABLET | Freq: Every day | ORAL | Status: DC
  Administered 2016-06-08 – 2016-06-09 (×2): 1 via ORAL
  Filled 2016-06-07 (×2): qty 1

## 2016-06-07 MED ORDER — SODIUM CHLORIDE 0.9% FLUSH: 3.0000 mL | INTRAVENOUS | Status: DC | PRN

## 2016-06-07 MED ORDER — INSULIN ASPART 100 UNIT/ML ~~LOC~~ SOLN
0.0000 [IU] | Freq: Three times a day (TID) | SUBCUTANEOUS | Status: DC
  Administered 2016-06-08: 2 [IU] via SUBCUTANEOUS
  Administered 2016-06-08: 3 [IU] via SUBCUTANEOUS
  Administered 2016-06-08 – 2016-06-09 (×2): 1 [IU] via SUBCUTANEOUS

## 2016-06-07 MED ORDER — SODIUM CHLORIDE 0.9% FLUSH
3.0000 mL | Freq: Two times a day (BID) | INTRAVENOUS | Status: DC
  Administered 2016-06-08: 3 mL via INTRAVENOUS

## 2016-06-07 MED ORDER — GABAPENTIN 100 MG PO CAPS
200.0000 mg | ORAL_CAPSULE | Freq: Every day | ORAL | Status: DC
  Administered 2016-06-07 – 2016-06-08 (×2): 200 mg via ORAL
  Filled 2016-06-07 (×2): qty 2

## 2016-06-07 MED ORDER — IBUPROFEN 200 MG PO TABS: 600.0000 mg | ORAL_TABLET | Freq: Four times a day (QID) | ORAL | Status: DC | PRN

## 2016-06-07 MED ORDER — ALBUTEROL SULFATE (2.5 MG/3ML) 0.083% IN NEBU: 2.5000 mg | INHALATION_SOLUTION | Freq: Four times a day (QID) | RESPIRATORY_TRACT | Status: DC | PRN

## 2016-06-07 MED ORDER — LORAZEPAM 0.5 MG PO TABS: 0.5000 mg | ORAL_TABLET | Freq: Once | ORAL | Status: DC | PRN

## 2016-06-07 MED ORDER — ALBUTEROL SULFATE HFA 108 (90 BASE) MCG/ACT IN AERS: 1.0000 | INHALATION_SPRAY | Freq: Four times a day (QID) | RESPIRATORY_TRACT | Status: DC | PRN

## 2016-06-07 MED ORDER — IOPAMIDOL (ISOVUE-300) INJECTION 61%
INTRAVENOUS | Status: AC
  Administered 2016-06-07: 100 mL
  Filled 2016-06-07: qty 100

## 2016-06-07 MED ORDER — SODIUM CHLORIDE 0.9 % IV SOLN: 250.0000 mL | INTRAVENOUS | Status: DC | PRN

## 2016-06-07 MED ORDER — ONDANSETRON HCL 8 MG PO TABS: 8.0000 mg | ORAL_TABLET | Freq: Two times a day (BID) | ORAL | Status: DC | PRN

## 2016-06-07 MED ORDER — PREDNISONE 20 MG PO TABS
40.0000 mg | ORAL_TABLET | Freq: Every day | ORAL | Status: DC
  Administered 2016-06-08 – 2016-06-09 (×2): 40 mg via ORAL
  Filled 2016-06-07 (×2): qty 2

## 2016-06-07 MED ORDER — SODIUM CHLORIDE 0.9 % IV BOLUS (SEPSIS)
1000.0000 mL | Freq: Once | INTRAVENOUS | Status: AC
  Administered 2016-06-07: 1000 mL via INTRAVENOUS

## 2016-06-07 NOTE — ED Notes (Signed)
Call to 3W as bed has not switched to "ready"

## 2016-06-07 NOTE — ED Notes (Signed)
Date and time results received: 06/07/16 2050   Test: lactic acid Critical Value: 2.31  Name of Provider Notified: Dr. Shanon Brow  Orders Received none needed

## 2016-06-07 NOTE — ED Notes (Signed)
Pt stated she is not going to give a urine sample. Stated its all blood not going to be clean.

## 2016-06-07 NOTE — ED Triage Notes (Signed)
Patient presents to the emergency room with C/O blood in stool. Pt states she has been passing blood since approximately 0900 am this morning. Pt was seen for something similar about 3 weeks ago. Pt reports the blood is bright red. Pt also has a hx of ulcerative colitis.   Pt has a hx of breast cancer and is actively receiving chemotherapy. She has chemo last Friday and has it every 3 weeks.

## 2016-06-07 NOTE — ED Provider Notes (Signed)
[facility] DEPT Provider Note   CSN: [id number] Arrival date & time: 06/07/16  1315     History   Chief Complaint Chief Complaint  Patient presents with  . Rectal Bleeding    HPI Brandi Dickson is a 56 y.o. female with history of ulcerative colitis, breast cancer undergoing chemotherapy, asthma, diabetes who presents with a one-day history of rectal bleeding. Patient states she was admitted 3 weeks ago for same. Patient began yesterday morning with complete bright red blood bowel movements. She states she has left lower quadrant abdominal pain with the urge to pass a bowel movement, however in between she has no pain. She normally does have left lower quadrant pain due to her ulcerative colitis, however does worse than normal. Patient did have one episode of vomiting this morning after trying to eat. Patient denies any chest pain, urinary symptoms. Patient states she has been short of breath, however she has been dealing with bad asthma flareups due to the current pollen. She does note that she short of breath when she is running to the bathroom.  HPI  Past Medical History:  Diagnosis Date  . Arthritis   . Asthma    triggered with [name] perfumes and cigarette smoke  . Cancer ([place])   . Colitis   . Diabetes mellitus without complication ([place])   . Hypertension   . Neuropathy     Patient Active Problem List   Diagnosis Date Noted  . GIB (gastrointestinal bleeding) 06/07/2016  . Hypertension   . Diabetes mellitus without complication ([place])   . Ulcerative colitis without complications ([place]) 03/47/4259  . Ulcerative rectosigmoiditis with rectal bleeding ([place])   . Hematochezia   . Generalized abdominal pain   . Nausea vomiting and diarrhea   . Diarrhea of presumed infectious origin   . Hyponatremia 05/18/2016  . Hypokalemia 05/18/2016  . Hypomagnesemia 05/18/2016  . Dehydration 05/18/2016  . Blood in stool 05/18/2016  . Thrombocytopenia ([place]) 05/18/2016  . GI  bleeding 05/18/2016  . Port catheter in place 05/11/2016  . Breast cancer of upper-outer quadrant of left female breast ([place]) 05/02/2016  . S/P left THA, AA 03/06/2016  . Rectal bleeding 07/14/2015  . LLQ abdominal pain 07/14/2015  . Special screening for malignant neoplasms, colon 07/14/2015  . Loss of weight 07/14/2015  . Family history of colon cancer 07/14/2015    Past Surgical History:  Procedure Laterality Date  . ABDOMINAL HYSTERECTOMY    . DILATION AND CURETTAGE OF UTERUS    . PORTACATH PLACEMENT Right 05/08/2016   Procedure: INSERTION PORT-A-CATH WITH [place];  Surgeon: [name], MD;  Location: [facility];  Service: General;  Laterality: Right;  . TONSILLECTOMY    . TOTAL HIP ARTHROPLASTY Right   . TOTAL HIP ARTHROPLASTY Left 03/06/2016   Procedure: LEFT TOTAL HIP ARTHROPLASTY ANTERIOR APPROACH;  Surgeon: [name], MD;  Location: [facility];  Service: Orthopedics;  Laterality: Left;    OB History    No data available       Home Medications    Prior to Admission medications   Medication Sig Start Date End Date Taking? Authorizing Provider  albuterol (PROVENTIL HFA;VENTOLIN HFA) 108 (90 Base) MCG/ACT inhaler Inhale 1-2 puffs into the lungs every 6 (six) hours as needed for wheezing or shortness of breath.   Yes [provider]  ASPIRIN 81 PO Take by mouth.   Yes [provider]  dexamethasone (DECADRON) 4 MG tablet Take 1 tablet (4 mg total) by mouth  daily. Start the day before Taxotere. Then again the day after chemo for 3 days. 05/02/16  Yes Truitt Merle, MD  dicyclomine (BENTYL) 20 MG tablet Take 1 tablet (20 mg total) by mouth 4 (four) times daily -  before meals and at bedtime. Patient taking differently: Take 20 mg by mouth 4 (four) times daily as needed.  05/24/16  Yes Charlynne Cousins, MD  diphenoxylate-atropine (LOMOTIL) 2.5-0.025 MG tablet Take one to two tablets every six hours as needed for diarrhea 05/16/16  Yes Truitt Merle, MD    ferrous sulfate (FERROUSUL) 325 (65 FE) MG tablet Take 1 tablet (325 mg total) by mouth 3 (three) times daily with meals. 03/06/16  Yes Babish, Rodman Key, PA-C  gabapentin (NEURONTIN) 100 MG capsule Take 200 mg by mouth at bedtime.    Yes [provider]  hydrocortisone (ANUSOL-HC) 25 MG suppository Place 1 suppository (25 mg total) rectally 2 (two) times daily. 05/24/16  Yes Charlynne Cousins, MD  loperamide (IMODIUM) 1 MG/5ML solution Take 3 mg by mouth as needed for diarrhea or loose stools.   Yes [provider]  LORazepam (ATIVAN) 0.5 MG tablet Take 1 tablet (0.5 mg total) by mouth once as needed for anxiety. 05/02/16  Yes Truitt Merle, MD  Mesalamine (ASACOL HD) 800 MG TBEC Take 2 tablets (1,600 mg total) by mouth 3 (three) times daily. 05/24/16  Yes Charlynne Cousins, MD  metFORMIN (GLUCOPHAGE-XR) 500 MG 24 hr tablet Take 1,000 mg by mouth at bedtime.   Yes [provider]  metoprolol (LOPRESSOR) 50 MG tablet Take 50 mg by mouth at bedtime.    Yes [provider]  niacin 250 MG tablet Take 250 mg by mouth at bedtime.    Yes [provider]  ondansetron (ZOFRAN) 8 MG tablet Take 1 tablet (8 mg total) by mouth 2 (two) times daily as needed for refractory nausea / vomiting. Start on day 3 after chemo. 05/02/16  Yes Truitt Merle, MD  predniSONE (DELTASONE) 20 MG tablet Take 40 mg by mouth daily. 05/24/16  Yes [provider]  prochlorperazine (COMPAZINE) 10 MG tablet Take 1 tablet (10 mg total) by mouth every 6 (six) hours as needed (Nausea or vomiting). 05/02/16  Yes Truitt Merle, MD  Specialty Vitamins Products (VITAMINS FOR THE HAIR) TABS Take 2 tablets by mouth 2 (two) times daily.   Yes [provider]  traMADol (ULTRAM) 50 MG tablet Take 1-2 tablets (50-100 mg total) by mouth every 6 (six) hours as needed. 03/06/16  Yes Babish, Rodman Key, PA-C  triamterene-hydrochlorothiazide (MAXZIDE) 75-50 MG per tablet Take 0.5 tablets by mouth daily.    Yes  [provider]  lidocaine-prilocaine (EMLA) cream Apply 1 application to skin 1.5 to 2 hrs before use.  Cover to secure cream with plastic wrap. 05/10/16   Truitt Merle, MD    Family History Family History  Problem Relation Age of Onset  . Colon cancer Father   . Stomach cancer Paternal Uncle   . Stomach cancer Paternal Uncle   . Melanoma Brother   . Thyroid cancer Brother   . Breast cancer Maternal Aunt   . Breast cancer Maternal Aunt   . Breast cancer Cousin     Social History Social History  Substance Use Topics  . Smoking status: Former Smoker    Packs/day: 1.00    Years: 29.00    Types: Cigarettes    Quit date: 10/27/2002  . Smokeless tobacco: Never Used  . Alcohol use No  Allergies   Lisinopril; Augmentin [amoxicillin-pot clavulanate]; and Benadryl [diphenhydramine]   Review of Systems Review of Systems  Constitutional: Positive for fatigue. Negative for chills and fever.  HENT: Negative for facial swelling and sore throat.   Respiratory: Negative for shortness of breath.   Cardiovascular: Negative for chest pain.  Gastrointestinal: Positive for abdominal pain, blood in stool, diarrhea (mostly blood), nausea and vomiting.  Genitourinary: Negative for dysuria.  Musculoskeletal: Negative for back pain.  Skin: Negative for rash and wound.  Neurological: Negative for headaches.  Psychiatric/Behavioral: The patient is not nervous/anxious.      Physical Exam Updated Vital Signs BP 115/75   Pulse 97   Temp 98.7 F (37.1 C) (Oral)   Resp 19   Ht 5' 4.75" (1.645 m)   Wt 85.7 kg   SpO2 97%   BMI 31.69 kg/m   Physical Exam  Constitutional: She appears well-developed and well-nourished. No distress.  HENT:  Head: Normocephalic and atraumatic.  Mouth/Throat: Oropharynx is clear and moist. No oropharyngeal exudate.  Eyes: Conjunctivae are normal. Pupils are equal, round, and reactive to light. Right eye exhibits no discharge. Left eye exhibits no  discharge. No scleral icterus.  Pale conjunctiva  Neck: Normal range of motion. Neck supple. No thyromegaly present.  Cardiovascular: Regular rhythm, normal heart sounds and intact distal pulses.  Tachycardia present.  Exam reveals no gallop and no friction rub.   No murmur heard. Pulmonary/Chest: Effort normal and breath sounds normal. No stridor. No respiratory distress. She has no wheezes. She has no rales.  Abdominal: Soft. Bowel sounds are normal. She exhibits no distension. There is tenderness in the left lower quadrant. There is no rebound and no guarding.  Musculoskeletal: She exhibits no edema.  Lymphadenopathy:    She has no cervical adenopathy.  Neurological: She is alert. Coordination normal.  Skin: Skin is warm and dry. No rash noted. She is not diaphoretic. No pallor.  Psychiatric: She has a normal mood and affect.  Nursing note and vitals reviewed.    ED Treatments / Results  Labs (all labs ordered are listed, but only abnormal results are displayed) Labs Reviewed  COMPREHENSIVE METABOLIC PANEL - Abnormal; Notable for the following:       Result Value   Glucose, Bld 196 (*)    Calcium 8.7 (*)    Total Protein 6.3 (*)    Albumin 3.0 (*)    All other components within normal limits  CBC WITH DIFFERENTIAL/PLATELET - Abnormal; Notable for the following:    WBC 0.8 (*)    Hemoglobin 11.5 (*)    HCT 34.8 (*)    Platelets 78 (*)    Neutro Abs 0.1 (*)    Lymphs Abs 0.6 (*)    All other components within normal limits  C DIFFICILE QUICK SCREEN W PCR REFLEX  GASTROINTESTINAL PANEL BY PCR, STOOL (REPLACES STOOL CULTURE)  LIPASE, BLOOD  URINALYSIS, ROUTINE W REFLEX MICROSCOPIC  CMV DNA, QUANTITATIVE, PCR  CBC WITH DIFFERENTIAL/PLATELET  SEDIMENTATION RATE  C-REACTIVE PROTEIN  I-STAT CG4 LACTIC ACID, ED  TYPE AND SCREEN    EKG  EKG Interpretation None       Radiology Ct Abdomen Pelvis W Contrast  Result Date: 06/07/2016 CLINICAL DATA:  "Patient presents  to the emergency room with C/O blood in stool. Pt states she has been passing blood since approximately 0900 am this morning. Pt was seen for something similar about 3 weeks ago. Pt reports the blood is bright red. Pt also has a  hx of ulcerative colitis. Pt has a hx of breast cancer and is actively receiving chemotherapy. She has chemo last Friday and has it every 3 weeks." EXAM: CT ABDOMEN AND PELVIS WITH CONTRAST TECHNIQUE: Multidetector CT imaging of the abdomen and pelvis was performed using the standard protocol following bolus administration of intravenous contrast. CONTRAST:  <See Chart> ISOVUE-300 IOPAMIDOL (ISOVUE-300) INJECTION 61% COMPARISON:  07/15/2015 FINDINGS: Lower chest: Clear lung bases.  Heart is normal in size. Hepatobiliary: Liver is normal in size and overall parenchymal echogenicity. There are several small hypoattenuating lesions, largest at posterior dome of the right lobe measuring 6 mm. Small lesions in the central right lobe, lateral segment of the left lobe and inferior medial segmental left lobe or not evident on the prior CT. No other liver abnormality. Normal gallbladder. No bile duct dilation. Pancreas: Unremarkable. No pancreatic ductal dilatation or surrounding inflammatory changes. Spleen: Normal in size without focal abnormality. Adrenals/Urinary Tract: Hypoattenuating, 11 mm left adrenal mass. The stability from the prior exam supports an adenoma. Fat density 9 mm right adrenal mass reflecting a mild lipoma, also stable. 1 cm lower pole left renal cyst. No other renal masses, no stones and no hydronephrosis. Normal ureters. Bladder is unremarkable but suboptimally visualized due to artifact from bilateral hip prosthesis. Stomach/Bowel: There is wall thickening of the colon. The left colon is mostly decompressed making evaluation for wall thickening problematic. The lower descending and sigmoid colon show more definitive mild wall thickening with the wall measuring 45 mm. The  transverse and right colon short thin wall with the exception of a portion of the upper ascending colon with there is irregular narrowing evidence of some wall thickening. This persists on the delayed sequence. This still may reflect a spasm. Constricting mass is not excluded. Stomach, duodenum and small bowel are unremarkable. Normal appendix is visualized. Vascular/Lymphatic: There several subcentimeter but somewhat prominent gastrohepatic ligament lymph nodes that are stable from the prior exam. There are no enlarged lymph nodes. Minor atherosclerotic plaque is noted along the abdominal aorta. No aneurysm. Reproductive: Status post hysterectomy. No adnexal masses. Other: No abdominal wall hernia or abnormality. No abdominopelvic ascites. Musculoskeletal: Bilateral total hip prosthesis are incompletely imaged, of fall where visualized appear to be well-seated and well-aligned. There is no acute fracture. Bones are demineralized is somewhat heterogeneous distribution, but there is no defined osteolytic lesion. No osteoblastic lesions. IMPRESSION: 1. Wall thickening of the descending and sigmoid colon consistent with an infectious or inflammatory colitis. 2. There is also a short segment of narrowing and possible wall thickening of the superior ascending colon which could reflect a constricting mass or, more likely, be an area of persistent colonic spasm. This could be further assessed with either colonoscopy or a barium enema after the current symptoms of colitis have resolved. 3. Small, subcentimeter, low-density liver lesions which may all be benign. However, 3 these are not evident on prior CT. Liver metastatic disease possible. These could be further assessed with liver MRI with and without contrast. 4. Adrenal lesions which are stable, on the right and myelolipoma and on the left most likely an adenoma. Electronically Signed   By: Lajean Manes M.D.   On: 06/07/2016 16:23    Procedures Procedures  (including critical care time)  Medications Ordered in ED Medications  sodium chloride 0.9 % bolus 1,000 mL (0 mLs Intravenous Stopped 06/07/16 1627)  iopamidol (ISOVUE-300) 61 % injection (100 mLs  Contrast Given 06/07/16 1544)     Initial Impression / Assessment and  Plan / ED Course  I have reviewed the triage vital signs and the nursing notes.  Pertinent labs & imaging results that were available during my care of the patient were reviewed by me and considered in my medical decision making (see chart for details).     Patient with active rectal bleeding. CBC shows WBC 0.8 and platelets 78K. CMP shows glucose 196, calcium 8.7, protein 6.3, albumin 3.0. CT abdomen and pelvis shows wall thickening of the descending and sigmoid colon consistent with an infectious or inflammatory colitis. I consulted Dr. Havery Moros with [facility] who advised trending of hemoglobin, lactate, CMV (considering patient's neutropenia), and gastrointestinal panel including C. difficile colitis. He advised to wait for antibiotic and steroid treatment if possible until C. difficile test has returned. If patient spikes a fever however, initiate cefepime. I consulted Dr. Jana Hakim with oncology who advised it was safe to start steroid treatment if needed, however to make sure patient was covered with cefepime prior. Fluids initiated in the ED. Vitals stable with mild tachycardia improved after fluids. Patient also evaluated by Dr. Alvino Chapel who guided the patient's management and agrees with plan. Patient vitals stable at disposition.  Final Clinical Impressions(s) / ED Diagnoses   Final diagnoses:  Rectal bleeding    New Prescriptions New Prescriptions   No medications on file     Caryl Ada 06/07/16 Yevette Edwards    Davonna Belling, MD 06/08/16 [phone]

## 2016-06-07 NOTE — ED Notes (Signed)
Brought [facility] in for pt and advised her that stool sample is needed.  PA updated pt on plan.  Pt is quite agitated about the wait, apologized to her, waiting on admitting MD.

## 2016-06-07 NOTE — H&P (Signed)
History and Physical    Brandi Dickson CVE:[id number] DOB: 04-22-1960 DOA: 06/07/2016  PCP: Orpah Melter, MD  Patient coming from:  home  Chief Complaint:  Rectal bleeding  HPI: Brandi Dickson is a 56 y.o. female with medical history significant of stage 2 breast cancer on chemo, ulcerative colitis, DM comes in with about 15 episodes of bleeding from rectum started this am.  No fevers.  Has abdominal cramps in the lower quadrant only when defacating.  No n /v. Has been doing well with her chemo treatment.  Has been on prednisone 61m po daily for her UC.  Both GI and oncology were called concerning patient.  Pt referred for admission for GIB in the setting of ulcerative colitis, neutropenia.  Review of Systems: As per HPI otherwise 10 point review of systems negative.   Past Medical History:  Diagnosis Date  . Arthritis   . Asthma    triggered with EMindi Curlingperfumes and cigarette smoke  . [place]   . Colitis   . [place]   . Hypertension   . Neuropathy     Past Surgical History:  Procedure Laterality Date  . ABDOMINAL HYSTERECTOMY    . DILATION AND CURETTAGE OF UTERUS    . PORTACATH PLACEMENT Right 05/08/2016   Procedure: INSERTION PORT-A-CATH WITH [place]  Surgeon: DAlphonsa Overall MD;  [facility]  Service: General;  Laterality: Right;  . TONSILLECTOMY    . TOTAL HIP ARTHROPLASTY Right   . TOTAL HIP ARTHROPLASTY Left 03/06/2016   Procedure: LEFT TOTAL HIP ARTHROPLASTY ANTERIOR APPROACH;  Surgeon: MParalee Cancel MD;  Location: [facility];  Service: Orthopedics;  Laterality: Left;     reports that she quit smoking about 13 years ago. Her smoking use included Cigarettes. She has a 29.00 pack-year smoking history. She has never used smokeless tobacco. She reports that she does not drink alcohol or use drugs.  Allergies  Allergen Reactions  . Lisinopril Palpitations  . Augmentin [Amoxicillin-Pot Clavulanate] Other  (See Comments)    sts gives her a yeast infection Has patient had a PCN reaction causing immediate rash, facial/tongue/throat swelling, SOB or lightheadedness with hypotension: no Has patient had a PCN reaction causing severe rash involving mucus membranes or skin necrosis: no Has patient had a PCN reaction that required hospitalization no Has patient had a PCN reaction occurring within the last 10 years: unknown If all of the above answers are "NO", then may proceed with Cephalosporin use.   . Benadryl [Diphenhydramine] Itching and Anxiety    Per pt: "Makes my skin crawl"; makes pt sensitive to touch    Family History  Problem Relation Age of Onset  . Colon cancer Father   . Stomach cancer Paternal Uncle   . Stomach cancer Paternal Uncle   . Melanoma Brother   . Thyroid cancer Brother   . Breast cancer Maternal Aunt   . Breast cancer Maternal Aunt   . Breast cancer Cousin     Prior to Admission medications   Medication Sig Start Date End Date Taking? Authorizing Provider  albuterol (PROVENTIL HFA;VENTOLIN HFA) 108 (90 Base) MCG/ACT inhaler Inhale 1-2 puffs into the lungs every 6 (six) hours as needed for wheezing or shortness of breath.   Yes [provider]  ASPIRIN 81 PO Take by mouth.   Yes [provider]  dexamethasone (DECADRON) 4 MG tablet Take 1 tablet (4 mg total) by mouth daily. Start the day before Taxotere. Then  again the day after chemo for 3 days. 05/02/16  Yes Truitt Merle, MD  dicyclomine (BENTYL) 20 MG tablet Take 1 tablet (20 mg total) by mouth 4 (four) times daily -  before meals and at bedtime. Patient taking differently: Take 20 mg by mouth 4 (four) times daily as needed.  05/24/16  Yes Charlynne Cousins, MD  diphenoxylate-atropine (LOMOTIL) 2.5-0.025 MG tablet Take one to two tablets every six hours as needed for diarrhea 05/16/16  Yes Truitt Merle, MD  ferrous sulfate (FERROUSUL) 325 (65 FE) MG tablet Take 1 tablet (325 mg total) by mouth 3 (three)  times daily with meals. 03/06/16  Yes Babish, Rodman Key, PA-C  gabapentin (NEURONTIN) 100 MG capsule Take 200 mg by mouth at bedtime.    Yes [provider]  hydrocortisone (ANUSOL-HC) 25 MG suppository Place 1 suppository (25 mg total) rectally 2 (two) times daily. 05/24/16  Yes Charlynne Cousins, MD  loperamide (IMODIUM) 1 MG/5ML solution Take 3 mg by mouth as needed for diarrhea or loose stools.   Yes [provider]  LORazepam (ATIVAN) 0.5 MG tablet Take 1 tablet (0.5 mg total) by mouth once as needed for anxiety. 05/02/16  Yes Truitt Merle, MD  Mesalamine (ASACOL HD) 800 MG TBEC Take 2 tablets (1,600 mg total) by mouth 3 (three) times daily. 05/24/16  Yes Charlynne Cousins, MD  metFORMIN (GLUCOPHAGE-XR) 500 MG 24 hr tablet Take 1,000 mg by mouth at bedtime.   Yes [provider]  metoprolol (LOPRESSOR) 50 MG tablet Take 50 mg by mouth at bedtime.    Yes [provider]  niacin 250 MG tablet Take 250 mg by mouth at bedtime.    Yes [provider]  ondansetron (ZOFRAN) 8 MG tablet Take 1 tablet (8 mg total) by mouth 2 (two) times daily as needed for refractory nausea / vomiting. Start on day 3 after chemo. 05/02/16  Yes Truitt Merle, MD  predniSONE (DELTASONE) 20 MG tablet Take 40 mg by mouth daily. 05/24/16  Yes [provider]  prochlorperazine (COMPAZINE) 10 MG tablet Take 1 tablet (10 mg total) by mouth every 6 (six) hours as needed (Nausea or vomiting). 05/02/16  Yes Truitt Merle, MD  Specialty Vitamins Products (VITAMINS FOR THE HAIR) TABS Take 2 tablets by mouth 2 (two) times daily.   Yes [provider]  traMADol (ULTRAM) 50 MG tablet Take 1-2 tablets (50-100 mg total) by mouth every 6 (six) hours as needed. 03/06/16  Yes Babish, Rodman Key, PA-C  triamterene-hydrochlorothiazide (MAXZIDE) 75-50 MG per tablet Take 0.5 tablets by mouth daily.    Yes [provider]  lidocaine-prilocaine (EMLA) cream Apply 1 application to skin 1.5 to 2 hrs  before use.  Cover to secure cream with plastic wrap. 05/10/16   Truitt Merle, MD    Physical Exam: Vitals:   06/07/16 1328 06/07/16 1330 06/07/16 1530  BP: 106/75  110/83  Pulse: (!) 113  (!) 101  Resp: 18  16  Temp: 98 F (36.7 C)  98.4 F (36.9 C)  TempSrc: Oral  Oral  SpO2: 96%  96%  Weight:  85.7 kg (189 lb)   Height:  5' 4.75" (1.645 m)       Constitutional: NAD, calm, comfortable Vitals:   06/07/16 1328 06/07/16 1330 06/07/16 1530  BP: 106/75  110/83  Pulse: (!) 113  (!) 101  Resp: 18  16  Temp: 98 F (36.7 C)  98.4 F (36.9 C)  TempSrc: Oral  Oral  SpO2: 96%  96%  Weight:  85.7 kg (189 lb)   Height:  5' 4.75" (1.645 m)    Eyes: PERRL, lids and conjunctivae normal ENMT: Mucous membranes are moist. Posterior pharynx clear of any exudate or lesions.Normal dentition.  Neck: normal, supple, no masses, no thyromegaly Respiratory: clear to auscultation bilaterally, no wheezing, no crackles. Normal respiratory effort. No accessory muscle use.  Cardiovascular: Regular rate and rhythm, no murmurs / rubs / gallops. No extremity edema. 2+ pedal pulses. No carotid bruits.  Abdomen: no tenderness, no masses palpated. No hepatosplenomegaly. Bowel sounds positive.  Musculoskeletal: no clubbing / cyanosis. No joint deformity upper and lower extremities. Good ROM, no contractures. Normal muscle tone.  Skin: no rashes, lesions, ulcers. No induration Neurologic: CN 2-12 grossly intact. Sensation intact, DTR normal. Strength 5/5 in all 4.  Psychiatric: Normal judgment and insight. Alert and oriented x 3. Normal mood.    Labs on Admission: I have personally reviewed following labs and imaging studies  CBC:  Recent Labs Lab 06/07/16 1431  WBC 0.8*  NEUTROABS 0.1*  HGB 11.5*  HCT 34.8*  MCV 87.0  PLT 78*   Basic Metabolic Panel:  Recent Labs Lab 06/07/16 1431  NA 135  K 3.5  CL 103  CO2 23  GLUCOSE 196*  BUN 17  CREATININE 0.78  CALCIUM 8.7*   GFR: Estimated  Creatinine Clearance: 83.4 mL/min (by C-G formula based on SCr of 0.78 mg/dL). Liver Function Tests:  Recent Labs Lab 06/07/16 1431  AST 19  ALT 20  ALKPHOS 71  BILITOT 0.8  PROT 6.3*  ALBUMIN 3.0*    Recent Labs Lab 06/07/16 1431  LIPASE 18   No results for input(s): AMMONIA in the last 168 hours. Coagulation Profile: No results for input(s): INR, PROTIME in the last 168 hours. Cardiac Enzymes: No results for input(s): CKTOTAL, CKMB, CKMBINDEX, TROPONINI in the last 168 hours. BNP (last 3 results) No results for input(s): PROBNP in the last 8760 hours. HbA1C: No results for input(s): HGBA1C in the last 72 hours. CBG: No results for input(s): GLUCAP in the last 168 hours. Lipid Profile: No results for input(s): CHOL, HDL, LDLCALC, TRIG, CHOLHDL, LDLDIRECT in the last 72 hours. Thyroid Function Tests: No results for input(s): TSH, T4TOTAL, FREET4, T3FREE, THYROIDAB in the last 72 hours. Anemia Panel: No results for input(s): VITAMINB12, FOLATE, FERRITIN, TIBC, IRON, RETICCTPCT in the last 72 hours. Urine analysis:    Component Value Date/Time   COLORURINE YELLOW 01/18/2015 [place] 01/18/2015 1209   LABSPEC 1.014 01/18/2015 1209   PHURINE 6.5 01/18/2015 1209   GLUCOSEU NEGATIVE 01/18/2015 1209   HGBUR NEGATIVE 01/18/2015 1209   BILIRUBINUR NEGATIVE 01/18/2015 1209   KETONESUR NEGATIVE 01/18/2015 1209   PROTEINUR NEGATIVE 01/18/2015 1209   UROBILINOGEN 0.2 12/30/2008 1317   NITRITE NEGATIVE 01/18/2015 1209   LEUKOCYTESUR NEGATIVE 01/18/2015 1209   Sepsis Labs: !!!!!!!!!!!!!!!!!!!!!!!!!!!!!!!!!!!!!!!!!!!! @LABRCNTIP (procalcitonin:4,lacticidven:4) )No results found for this or any previous visit (from the past 240 hour(s)).   Radiological Exams on Admission: Ct Abdomen Pelvis W Contrast  Result Date: 06/07/2016 CLINICAL DATA:  "Patient presents to the emergency room with C/O blood in stool. Pt states she has been passing blood since  approximately 0900 am this morning. Pt was seen for something similar about 3 weeks ago. Pt reports the blood is bright red. Pt also has a hx of ulcerative colitis. Pt has a hx of breast cancer and is actively receiving chemotherapy. She has chemo last Friday and has it every 3  weeks." EXAM: CT ABDOMEN AND PELVIS WITH CONTRAST TECHNIQUE: Multidetector CT imaging of the abdomen and pelvis was performed using the standard protocol following bolus administration of intravenous contrast. CONTRAST:  <See Chart> ISOVUE-300 IOPAMIDOL (ISOVUE-300) INJECTION 61% COMPARISON:  07/15/2015 FINDINGS: Lower chest: Clear lung bases.  Heart is normal in size. Hepatobiliary: Liver is normal in size and overall parenchymal echogenicity. There are several small hypoattenuating lesions, largest at posterior dome of the right lobe measuring 6 mm. Small lesions in the central right lobe, lateral segment of the left lobe and inferior medial segmental left lobe or not evident on the prior CT. No other liver abnormality. Normal gallbladder. No bile duct dilation. Pancreas: Unremarkable. No pancreatic ductal dilatation or surrounding inflammatory changes. Spleen: Normal in size without focal abnormality. Adrenals/Urinary Tract: Hypoattenuating, 11 mm left adrenal mass. The stability from the prior exam supports an adenoma. Fat density 9 mm right adrenal mass reflecting a mild lipoma, also stable. 1 cm lower pole left renal cyst. No other renal masses, no stones and no hydronephrosis. Normal ureters. Bladder is unremarkable but suboptimally visualized due to artifact from bilateral hip prosthesis. Stomach/Bowel: There is wall thickening of the colon. The left colon is mostly decompressed making evaluation for wall thickening problematic. The lower descending and sigmoid colon show more definitive mild wall thickening with the wall measuring 45 mm. The transverse and right colon short thin wall with the exception of a portion of the upper  ascending colon with there is irregular narrowing evidence of some wall thickening. This persists on the delayed sequence. This still may reflect a spasm. Constricting mass is not excluded. Stomach, duodenum and small bowel are unremarkable. Normal appendix is visualized. Vascular/Lymphatic: There several subcentimeter but somewhat prominent gastrohepatic ligament lymph nodes that are stable from the prior exam. There are no enlarged lymph nodes. Minor atherosclerotic plaque is noted along the abdominal aorta. No aneurysm. Reproductive: Status post hysterectomy. No adnexal masses. Other: No abdominal wall hernia or abnormality. No abdominopelvic ascites. Musculoskeletal: Bilateral total hip prosthesis are incompletely imaged, of fall where visualized appear to be well-seated and well-aligned. There is no acute fracture. Bones are demineralized is somewhat heterogeneous distribution, but there is no defined osteolytic lesion. No osteoblastic lesions. IMPRESSION: 1. Wall thickening of the descending and sigmoid colon consistent with an infectious or inflammatory colitis. 2. There is also a short segment of narrowing and possible wall thickening of the superior ascending colon which could reflect a constricting mass or, more likely, be an area of persistent colonic spasm. This could be further assessed with either colonoscopy or a barium enema after the current symptoms of colitis have resolved. 3. Small, subcentimeter, low-density liver lesions which may all be benign. However, 3 these are not evident on prior CT. Liver metastatic disease possible. These could be further assessed with liver MRI with and without contrast. 4. Adrenal lesions which are stable, on the right and myelolipoma and on the left most likely an adenoma. Electronically Signed   By: Lajean Manes M.D.   On: 06/07/2016 16:23    Assessment/Plan 56 yo female chemo active comes in with rectal bleeding h/o ulcerative colitis and  neutropenia Principal Problem:   Hematochezia- per GI r/o cdiff first before starting iv steroids.  Obtain cdiff and stool culture.  Also wait on abx at this time unless spikes fever.  Pt currently stable and nontoxic appearing.  If any change in status worsening start broad spectrum antibiotics.  Active Problems:   Ulcerative colitis without complications (  [place])- GI to see in am, call if becomes unstable tonight   Breast cancer of upper-outer quadrant of left female breast ([place])- noted, oncology to see in am   Hypertension- stable   Diabetes mellitus without complication ([place])- SSI     DVT prophylaxis:  scds Code Status:  full Family Communication:  husband Disposition Plan:  Per day team Consults called:   GI and oncology Admission status:  admission   DAVID,RACHAL A MD [facility]  If 7PM-7AM, please contact night-coverage [email or web address] Password [facility]  06/07/2016, 6:34 PM

## 2016-06-07 NOTE — Telephone Encounter (Signed)
Pt has very urgent diarrhea but is only passing BRB blood, this started this morning, has severe cramping before and during the BM.  Pt is taking 40 mg once daily, should taper to 30 mg tomorrow, asacol 2 pills 3 times daily.  No other GI symptoms.  I consulted with Janett Billow and she states the pt needs to be seen in the ED.  There is nothing further she can offer acutely.  I did advise the pt and she will call her husband and have him pick her up and take her to the ED.  She was advised to rise slowly and sit as much as possible if she begins to feel dizzy.  Pt verbalized understanding.

## 2016-06-07 NOTE — Progress Notes (Unsigned)
Called to consult on this patient with a history of estrogen receptor negative, HER-2 amplified stage II breast cancer being treated  neoadjuvantly with carboplatin, taxotere, Herceptin and Perjeta. She is currently day 7 cycle 2 of six planned cycles given every 21 days. She received neulasta 06/04/2016, now admitted with rectal bleeding and apparent flare-up of her ulcerative colitis.  From our point of view if the UC needs emergent treatment it should proceed as per GI. Note that Perjeta does cause diarrhea in most patients, and it can be severe. Given the current neutropenia (ANC 0.1) it would be prudent to cover the patient (consider cefepime prophylactically) whether or not steroids are initiated at this time.  Full note to follow.

## 2016-06-07 NOTE — ED Notes (Signed)
Delay in EKG Pt in bathroom

## 2016-06-07 NOTE — ED Notes (Signed)
Attempted to draw labs, pt has port and only wants labs drawn off port.

## 2016-06-07 NOTE — ED Notes (Signed)
Pt was given a meal tray which she ate. Explained to pt that bed is assigned and I will transport her up once it is ready.

## 2016-06-08 ENCOUNTER — Ambulatory Visit: Payer: 59 | Admitting: Hematology

## 2016-06-08 ENCOUNTER — Other Ambulatory Visit: Payer: 59

## 2016-06-08 DIAGNOSIS — R197 Diarrhea, unspecified: Secondary | ICD-10-CM | POA: Diagnosis not present

## 2016-06-08 DIAGNOSIS — C50412 Malignant neoplasm of upper-outer quadrant of left female breast: Secondary | ICD-10-CM | POA: Diagnosis not present

## 2016-06-08 DIAGNOSIS — E119 Type 2 diabetes mellitus without complications: Secondary | ICD-10-CM | POA: Diagnosis not present

## 2016-06-08 DIAGNOSIS — D61810 Antineoplastic chemotherapy induced pancytopenia: Secondary | ICD-10-CM | POA: Diagnosis not present

## 2016-06-08 DIAGNOSIS — K51311 Ulcerative (chronic) rectosigmoiditis with rectal bleeding: Secondary | ICD-10-CM

## 2016-06-08 DIAGNOSIS — Z171 Estrogen receptor negative status [ER-]: Secondary | ICD-10-CM | POA: Diagnosis not present

## 2016-06-08 DIAGNOSIS — I10 Essential (primary) hypertension: Secondary | ICD-10-CM | POA: Diagnosis not present

## 2016-06-08 DIAGNOSIS — K5130 Ulcerative (chronic) rectosigmoiditis without complications: Secondary | ICD-10-CM | POA: Diagnosis not present

## 2016-06-08 DIAGNOSIS — K5190 Ulcerative colitis, unspecified, without complications: Secondary | ICD-10-CM | POA: Diagnosis not present

## 2016-06-08 DIAGNOSIS — T451X5A Adverse effect of antineoplastic and immunosuppressive drugs, initial encounter: Secondary | ICD-10-CM

## 2016-06-08 DIAGNOSIS — K921 Melena: Secondary | ICD-10-CM | POA: Diagnosis not present

## 2016-06-08 DIAGNOSIS — C189 Malignant neoplasm of colon, unspecified: Secondary | ICD-10-CM

## 2016-06-08 LAB — IRON AND TIBC
IRON: 20 ug/dL — AB (ref 28–170)
Saturation Ratios: 7 % — ABNORMAL LOW (ref 10.4–31.8)
TIBC: 277 ug/dL (ref 250–450)
UIBC: 257 ug/dL

## 2016-06-08 LAB — GLUCOSE, CAPILLARY
GLUCOSE-CAPILLARY: 174 mg/dL — AB (ref 65–99)
Glucose-Capillary: 130 mg/dL — ABNORMAL HIGH (ref 65–99)
Glucose-Capillary: 189 mg/dL — ABNORMAL HIGH (ref 65–99)
Glucose-Capillary: 228 mg/dL — ABNORMAL HIGH (ref 65–99)

## 2016-06-08 LAB — URINALYSIS, ROUTINE W REFLEX MICROSCOPIC
BACTERIA UA: NONE SEEN
Bilirubin Urine: NEGATIVE
Glucose, UA: NEGATIVE mg/dL
Ketones, ur: NEGATIVE mg/dL
Leukocytes, UA: NEGATIVE
Nitrite: NEGATIVE
PROTEIN: NEGATIVE mg/dL
SPECIFIC GRAVITY, URINE: 1.014 (ref 1.005–1.030)
pH: 6 (ref 5.0–8.0)

## 2016-06-08 LAB — BASIC METABOLIC PANEL
ANION GAP: 8 (ref 5–15)
BUN: 14 mg/dL (ref 6–20)
CALCIUM: 8.2 mg/dL — AB (ref 8.9–10.3)
CO2: 27 mmol/L (ref 22–32)
CREATININE: 0.65 mg/dL (ref 0.44–1.00)
Chloride: 102 mmol/L (ref 101–111)
GFR calc non Af Amer: >60 mL/min (ref >60)
GLUCOSE: 113 mg/dL — AB (ref 65–99)
Potassium: 2.8 mmol/L — ABNORMAL LOW (ref 3.5–5.1)
Sodium: 137 mmol/L (ref 135–145)

## 2016-06-08 LAB — C DIFFICILE QUICK SCREEN W PCR REFLEX
C DIFFICILE (CDIFF) INTERP: NOT DETECTED
C DIFFICILE (CDIFF) TOXIN: NEGATIVE
C DIFFICLE (CDIFF) ANTIGEN: NEGATIVE

## 2016-06-08 LAB — CBC
HCT: 30.6 % — ABNORMAL LOW (ref 36.0–46.0)
HEMOGLOBIN: 9.7 g/dL — AB (ref 12.0–15.0)
MCH: 27.8 pg (ref 26.0–34.0)
MCHC: 31.7 g/dL (ref 30.0–36.0)
MCV: 87.7 fL (ref 78.0–100.0)
Platelets: 68 10*3/uL — ABNORMAL LOW (ref 150–400)
RBC: 3.49 MIL/uL — AB (ref 3.87–5.11)
RDW: 14.7 % (ref 11.5–15.5)
WBC: 2.2 10*3/uL — ABNORMAL LOW (ref 4.0–10.5)

## 2016-06-08 LAB — C-REACTIVE PROTEIN: CRP: 6 mg/dL — ABNORMAL HIGH (ref <1.0)

## 2016-06-08 LAB — FOLATE: FOLATE: 10.5 ng/mL (ref >5.9)

## 2016-06-08 LAB — FERRITIN: Ferritin: 311 ng/mL — ABNORMAL HIGH (ref 11–307)

## 2016-06-08 LAB — VITAMIN B12: VITAMIN B 12: 1455 pg/mL — AB (ref 180–914)

## 2016-06-08 LAB — CMV DNA, QUANTITATIVE, PCR
CMV DNA Quant: NEGATIVE IU/mL
Log10 CMV Qn DNA Pl: UNDETERMINED log10 IU/mL

## 2016-06-08 LAB — MAGNESIUM: MAGNESIUM: 1.4 mg/dL — AB (ref 1.7–2.4)

## 2016-06-08 MED ORDER — MAGNESIUM SULFATE 4 GM/100ML IV SOLN
4.0000 g | Freq: Once | INTRAVENOUS | Status: AC
  Administered 2016-06-08: 4 g via INTRAVENOUS
  Filled 2016-06-08: qty 100

## 2016-06-08 MED ORDER — BENEPROTEIN PO POWD
1.0000 | Freq: Three times a day (TID) | ORAL | Status: DC
  Filled 2016-06-08: qty 227

## 2016-06-08 MED ORDER — METOPROLOL TARTRATE 25 MG PO TABS
25.0000 mg | ORAL_TABLET | Freq: Two times a day (BID) | ORAL | Status: DC
  Administered 2016-06-08 – 2016-06-09 (×3): 25 mg via ORAL
  Filled 2016-06-08 (×3): qty 1

## 2016-06-08 MED ORDER — POTASSIUM CHLORIDE CRYS ER 20 MEQ PO TBCR
40.0000 meq | EXTENDED_RELEASE_TABLET | ORAL | Status: AC
  Administered 2016-06-08 (×3): 40 meq via ORAL
  Filled 2016-06-08 (×3): qty 2

## 2016-06-08 MED ORDER — RESOURCE INSTANT PROTEIN PO PWD PACKET
6.0000 g | Freq: Three times a day (TID) | ORAL | Status: DC
  Administered 2016-06-08 – 2016-06-09 (×2): 6 g via ORAL
  Filled 2016-06-08 (×3): qty 6

## 2016-06-08 NOTE — Consult Note (Signed)
Referring Provider: [facility] Primary Care Physician:  Orpah Melter, MD Primary Gastroenterologist:  Lucio Edward, MD  Reason for Consultation:  Rectal bleeding  ASSESSMENT AND PLAN:   63. 56 yo female with UC diagnosed a year ago. Battling loose stools since starting chemo for breast cancer and was hospitalized late April with nausea, vomiting, diarrhea and rectal bleeding. GI pathogen panel and c-diff were negative. Treated with steroids and Mesalamine increased to 4.8 grams daily. Symptoms improved save the loose stools. She hasn't had a solid stool in weeks, at least since starting chemo. Yesterday morning she developed recurrent rectal bleeding. No stool with the blood. She does have diffuse lower abdominal pain just prior to passing the blood and before BMs. Only one associated episode of vomiting. No fevers. CTscan yesterday reveals wall thickening of descending and sigmoid colon.  -this could be infectious, especially since immunocompromised state but seems unlikely since she had negative stool studies during admission late April ffor same symptoms. Additionally, she has mainly been passing just blood, no stool.  -Bleeding could be from UC flare. Chronic moderately active colitis on colonoscopy July 2017. Not clear why she would have recurrent symptoms after improving on Prednisone 40mg  and higher dose of Mesalamine. Regardless, stool studies are being repeated. If negative then will consider solumedrol. .  -No evidence for diverticular disease on colonoscopy nor imaging  2. Sinus tachycardia 3. Hypokalemia, repletion per primary team 4. Pancytopenia, on chemotherapy. Recent Neulasta, WBC 2.2 up from 1.3.  4. Liver lesions on CTscan. Though may be benign they were not present on previous CT. Will defer to her Oncologist  HPI: Brandi Dickson is a 56 y.o. female with ulcerative colitis diagnosed a year ago.  Recently diagnosed with breast cancer, currently getting  chemotherapy. After starting chemo she developed diarrhea. She was hospitalized late April with what was felt to be a UC flare vrs infectious enterocolitis.  GI path panel and c-diff were negative. Her home Mesalamine was increased, she was treated with steroids. Bleeding resolved but her stools are still frequently loose. . Seen post hospital follow up 5/3 by Oncology. Her chemo regimen was changed to one less likely to cause diarrhea. She started 2nd cycle of chemotherapy last Friday.   Patient was seen in the office 3 days ago for hospital follow.  She was still having loose stools but attributed it to starting cycle 2 of chemo last Friday. We continued her Asacol 4.8 grams and planned to taper prednisone by 10mg  every two weeks. Yesterday morning she began having recurrent rectal bleeding. She had several episodes of rectal bleeding with diffuse lower abdominal pain just prior to each episode. She did not pass stool with any of the episode, just blood. She vomited once after trying to eat. No fevers.     Past Medical History:  Diagnosis Date  . Arthritis   . Asthma    triggered with [name] perfumes and cigarette smoke  . Cancer ([place])   . Colitis   . Diabetes mellitus without complication ([place])   . Hypertension   . Neuropathy     Past Surgical History:  Procedure Laterality Date  . ABDOMINAL HYSTERECTOMY    . DILATION AND CURETTAGE OF UTERUS    . PORTACATH PLACEMENT Right 05/08/2016   Procedure: INSERTION PORT-A-CATH WITH [place];  Surgeon: [name], MD;  Location: [facility];  Service: General;  Laterality: Right;  . TONSILLECTOMY    . TOTAL HIP ARTHROPLASTY Right   .  TOTAL HIP ARTHROPLASTY Left 03/06/2016   Procedure: LEFT TOTAL HIP ARTHROPLASTY ANTERIOR APPROACH;  Surgeon: [name], MD;  Location: [facility];  Service: Orthopedics;  Laterality: Left;    Prior to Admission medications   Medication Sig Start Date End Date Taking? Authorizing Provider  albuterol  (PROVENTIL HFA;VENTOLIN HFA) 108 (90 Base) MCG/ACT inhaler Inhale 1-2 puffs into the lungs every 6 (six) hours as needed for wheezing or shortness of breath.   Yes [provider]  ASPIRIN 81 PO Take by mouth.   Yes [provider]  dexamethasone (DECADRON) 4 MG tablet Take 1 tablet (4 mg total) by mouth daily. Start the day before Taxotere. Then again the day after chemo for 3 days. 05/02/16  Yes Truitt Merle, MD  dicyclomine (BENTYL) 20 MG tablet Take 1 tablet (20 mg total) by mouth 4 (four) times daily -  before meals and at bedtime. Patient taking differently: Take 20 mg by mouth 4 (four) times daily as needed.  05/24/16  Yes Charlynne Cousins, MD  diphenoxylate-atropine (LOMOTIL) 2.5-0.025 MG tablet Take one to two tablets every six hours as needed for diarrhea 05/16/16  Yes Truitt Merle, MD  ferrous sulfate (FERROUSUL) 325 (65 FE) MG tablet Take 1 tablet (325 mg total) by mouth 3 (three) times daily with meals. 03/06/16  Yes Babish, Rodman Key, PA-C  gabapentin (NEURONTIN) 100 MG capsule Take 200 mg by mouth at bedtime.    Yes [provider]  hydrocortisone (ANUSOL-HC) 25 MG suppository Place 1 suppository (25 mg total) rectally 2 (two) times daily. 05/24/16  Yes Charlynne Cousins, MD  loperamide (IMODIUM) 1 MG/5ML solution Take 3 mg by mouth as needed for diarrhea or loose stools.   Yes [provider]  LORazepam (ATIVAN) 0.5 MG tablet Take 1 tablet (0.5 mg total) by mouth once as needed for anxiety. 05/02/16  Yes Truitt Merle, MD  Mesalamine (ASACOL HD) 800 MG TBEC Take 2 tablets (1,600 mg total) by mouth 3 (three) times daily. 05/24/16  Yes Charlynne Cousins, MD  metFORMIN (GLUCOPHAGE-XR) 500 MG 24 hr tablet Take 1,000 mg by mouth at bedtime.   Yes [provider]  metoprolol (LOPRESSOR) 50 MG tablet Take 50 mg by mouth at bedtime.    Yes [provider]  niacin 250 MG tablet Take 250 mg by mouth at bedtime.    Yes [provider]    ondansetron (ZOFRAN) 8 MG tablet Take 1 tablet (8 mg total) by mouth 2 (two) times daily as needed for refractory nausea / vomiting. Start on day 3 after chemo. 05/02/16  Yes Truitt Merle, MD  predniSONE (DELTASONE) 20 MG tablet Take 40 mg by mouth daily. 05/24/16  Yes [provider]  prochlorperazine (COMPAZINE) 10 MG tablet Take 1 tablet (10 mg total) by mouth every 6 (six) hours as needed (Nausea or vomiting). 05/02/16  Yes Truitt Merle, MD  Specialty Vitamins Products (VITAMINS FOR THE HAIR) TABS Take 2 tablets by mouth 2 (two) times daily.   Yes [provider]  traMADol (ULTRAM) 50 MG tablet Take 1-2 tablets (50-100 mg total) by mouth every 6 (six) hours as needed. 03/06/16  Yes Babish, Rodman Key, PA-C  triamterene-hydrochlorothiazide (MAXZIDE) 75-50 MG per tablet Take 0.5 tablets by mouth daily.    Yes [provider]  lidocaine-prilocaine (EMLA) cream Apply 1 application to skin 1.5 to 2 hrs before use.  Cover to secure cream with plastic wrap. 05/10/16   Truitt Merle, MD    Current Facility-Administered Medications  Medication Dose Route Frequency Provider Last Rate Last Dose  . 0.9 %  sodium chloride infusion  250 mL Intravenous PRN Derrill Kay A, MD      . albuterol (PROVENTIL) (2.5 MG/3ML) 0.083% nebulizer solution 2.5 mg  2.5 mg Nebulization Q6H PRN Phillips Grout, MD      . gabapentin (NEURONTIN) capsule 200 mg  200 mg Oral QHS Derrill Kay A, MD   200 mg at 06/07/16 2215  . ibuprofen (ADVIL,MOTRIN) tablet 600 mg  600 mg Oral Q6H PRN Phillips Grout, MD      . insulin aspart (novoLOG) injection 0-9 Units  0-9 Units Subcutaneous TID WC Phillips Grout, MD   1 Units at 06/08/16 [phone]  . LORazepam (ATIVAN) tablet 0.5 mg  0.5 mg Oral Once PRN Derrill Kay A, MD      . metoprolol tartrate (LOPRESSOR) tablet 25 mg  25 mg Oral BID Eugenie Filler, MD   25 mg at 06/08/16 0949  . ondansetron (ZOFRAN) tablet 8 mg  8 mg Oral BID PRN Derrill Kay A, MD      . potassium  chloride SA (K-DUR,KLOR-CON) CR tablet 40 mEq  40 mEq Oral Q4H Eugenie Filler, MD   40 mEq at 06/08/16 0949  . predniSONE (DELTASONE) tablet 40 mg  40 mg Oral Q breakfast Phillips Grout, MD   40 mg at 06/08/16 1700  . prochlorperazine (COMPAZINE) tablet 10 mg  10 mg Oral Q6H PRN Derrill Kay A, MD      . sodium chloride flush (NS) 0.9 % injection 3 mL  3 mL Intravenous Q12H David, Rachal A, MD      . sodium chloride flush (NS) 0.9 % injection 3 mL  3 mL Intravenous PRN Phillips Grout, MD      . triamterene-hydrochlorothiazide (MAXZIDE-25) 37.5-25 MG per tablet 1 tablet  1 tablet Oral Daily Phillips Grout, MD   1 tablet at 06/08/16 1749   Facility-Administered Medications Ordered in Other Encounters  Medication Dose Route Frequency Provider Last Rate Last Dose  . heparin lock flush 100 unit/mL  500 Units Intracatheter Once PRN Truitt Merle, MD      . sodium chloride flush (NS) 0.9 % injection 10 mL  10 mL Intracatheter PRN Truitt Merle, MD        Allergies as of 06/07/2016 - Review Complete 06/07/2016  Allergen Reaction Noted  . Lisinopril Palpitations 07/11/2015  . Augmentin [amoxicillin-pot clavulanate] Other (See Comments) 09/21/2014  . Benadryl [diphenhydramine] Itching and Anxiety 05/13/2016    Family History  Problem Relation Age of Onset  . Colon cancer Father   . Stomach cancer Paternal Uncle   . Stomach cancer Paternal Uncle   . Melanoma Brother   . Thyroid cancer Brother   . Breast cancer Maternal Aunt   . Breast cancer Maternal Aunt   . Breast cancer Cousin     Social History   Social History  . Marital status: Married    Spouse name: N/A  . Number of children: N/A  . Years of education: N/A   Occupational History  . Not on file.   Social History Main Topics  . Smoking status: Former Smoker    Packs/day: 1.00    Years: 29.00    Types: Cigarettes    Quit date: 10/27/2002  . Smokeless tobacco: Never Used  . Alcohol use No  . Drug use: No  . Sexual  activity: Yes    Birth control/ protection: Surgical  Other Topics Concern  . Not on file   Social History Narrative  . No narrative on file    Review of Systems: All systems reviewed and negative except where noted in HPI.  Physical Exam: Vital signs in last 24 hours: Temp:  [98 F (36.7 C)-98.8 F (37.1 C)] 98.7 F (37.1 C) (05/11 0547) Pulse Rate:  [83-124] 124 (05/11 0547) Resp:  [16-20] 20 (05/11 0547) BP: (106-124)/(73-83) 113/73 (05/11 0547) SpO2:  [94 %-98 %] 98 % (05/11 0547) Weight:  [189 lb (85.7 kg)-197 lb 14.4 oz (89.8 kg)] 197 lb 14.4 oz (89.8 kg) (05/10 2210) Last BM Date: 06/07/16 General:   Alert,  Obese, pleasant and cooperative in NAD Eyes:  Sclera clear, no icterus.   Conjunctiva pink. Ears:  Normal auditory acuity. Nose:  No deformity, discharge,  or lesions. Neck:  Supple; no masses or thyromegaly. Lungs:  Clear throughout to auscultation.   No wheezes, crackles, or rhonchi.  Heart:  Sinus tachycardia, no murmurs, no edema. Abdomen:  Soft, mild LLQ tenderness, BS active,no palp mass Rectal:  Deferred  Msk:  Symmetrical without gross deformities. . Neurologic:  Alert and  oriented x4;  grossly normal neurologically. Skin:  Intact without significant lesions or rashes.. Psych:  Alert and cooperative. Normal mood and affect.  Intake/Output from previous day: 05/10 0701 - 05/11 0700 In: 1480 [P.O.:480; IV Piggyback:1000] Out: -  Intake/Output this shift: No intake/output data recorded.  Lab Results:  Recent Labs  06/07/16 1431 06/07/16 1848 06/08/16 0500  WBC 0.8* 1.3* 2.2*  HGB 11.5* 11.0* 9.7*  HCT 34.8* 33.4* 30.6*  PLT 78* 79* 68*   BMET  Recent Labs  06/07/16 1431 06/08/16 0500  NA 135 137  K 3.5 2.8*  CL 103 102  CO2 23 27  GLUCOSE 196* 113*  BUN 17 14  CREATININE 0.78 0.65  CALCIUM 8.7* 8.2*   LFT  Recent Labs  06/07/16 1431  PROT 6.3*  ALBUMIN 3.0*  AST 19  ALT 20  ALKPHOS 71  BILITOT 0.8   PT/INR No  results for input(s): LABPROT, INR in the last 72 hours. Hepatitis Panel No results for input(s): HEPBSAG, HCVAB, HEPAIGM, HEPBIGM in the last 72 hours.   Studies/Results: Ct Abdomen Pelvis W Contrast  Result Date: 06/07/2016 CLINICAL DATA:  "Patient presents to the emergency room with C/O blood in stool. Pt states she has been passing blood since approximately 0900 am this morning. Pt was seen for something similar about 3 weeks ago. Pt reports the blood is bright red. Pt also has a hx of ulcerative colitis. Pt has a hx of breast cancer and is actively receiving chemotherapy. She has chemo last Friday and has it every 3 weeks." EXAM: CT ABDOMEN AND PELVIS WITH CONTRAST TECHNIQUE: Multidetector CT imaging of the abdomen and pelvis was performed using the standard protocol following bolus administration of intravenous contrast. CONTRAST:  <See Chart> ISOVUE-300 IOPAMIDOL (ISOVUE-300) INJECTION 61% COMPARISON:  07/15/2015 FINDINGS: Lower chest: Clear lung bases.  Heart is normal in size. Hepatobiliary: Liver is normal in size and overall parenchymal echogenicity. There are several small hypoattenuating lesions, largest at posterior dome of the right lobe measuring 6 mm. Small lesions in the central right lobe, lateral segment of the left lobe and inferior medial segmental left lobe or not evident on the prior CT. No other liver abnormality. Normal gallbladder. No bile duct dilation. Pancreas: Unremarkable. No pancreatic ductal dilatation or surrounding inflammatory changes. Spleen: Normal in size without focal abnormality. Adrenals/Urinary Tract:  Hypoattenuating, 11 mm left adrenal mass. The stability from the prior exam supports an adenoma. Fat density 9 mm right adrenal mass reflecting a mild lipoma, also stable. 1 cm lower pole left renal cyst. No other renal masses, no stones and no hydronephrosis. Normal ureters. Bladder is unremarkable but suboptimally visualized due to artifact from bilateral hip  prosthesis. Stomach/Bowel: There is wall thickening of the colon. The left colon is mostly decompressed making evaluation for wall thickening problematic. The lower descending and sigmoid colon show more definitive mild wall thickening with the wall measuring 45 mm. The transverse and right colon short thin wall with the exception of a portion of the upper ascending colon with there is irregular narrowing evidence of some wall thickening. This persists on the delayed sequence. This still may reflect a spasm. Constricting mass is not excluded. Stomach, duodenum and small bowel are unremarkable. Normal appendix is visualized. Vascular/Lymphatic: There several subcentimeter but somewhat prominent gastrohepatic ligament lymph nodes that are stable from the prior exam. There are no enlarged lymph nodes. Minor atherosclerotic plaque is noted along the abdominal aorta. No aneurysm. Reproductive: Status post hysterectomy. No adnexal masses. Other: No abdominal wall hernia or abnormality. No abdominopelvic ascites. Musculoskeletal: Bilateral total hip prosthesis are incompletely imaged, of fall where visualized appear to be well-seated and well-aligned. There is no acute fracture. Bones are demineralized is somewhat heterogeneous distribution, but there is no defined osteolytic lesion. No osteoblastic lesions. IMPRESSION: 1. Wall thickening of the descending and sigmoid colon consistent with an infectious or inflammatory colitis. 2. There is also a short segment of narrowing and possible wall thickening of the superior ascending colon which could reflect a constricting mass or, more likely, be an area of persistent colonic spasm. This could be further assessed with either colonoscopy or a barium enema after the current symptoms of colitis have resolved. 3. Small, subcentimeter, low-density liver lesions which may all be benign. However, 3 these are not evident on prior CT. Liver metastatic disease possible. These could be  further assessed with liver MRI with and without contrast. 4. Adrenal lesions which are stable, on the right and myelolipoma and on the left most likely an adenoma. Electronically Signed   By: Lajean Manes M.D.   On: 06/07/2016 16:23     Tye Savoy, NP-C @  06/08/2016, 9:54 AM  Pager number [phone]  GI ATTENDING  History, laboratory, x-rays, prior colonoscopy report and pathology reviewed. Patient seen and examined personally. Agree with comprehensive consultation note as outlined above. Husband in room. Patient diagnosed with left-sided ulcerative colitis last year. Began chemotherapy for breast cancer last month. In the hospital last month with flare of her colitis. Was doing well until yesterday morning. He felt bad. Having tenesmus bleeding. No significant abdominal pain. No fevers. No other complaints. As an outpatient was on oral mesalamine and prednisone. These have been continued. Feels a little better today. Stool studies being obtained to rule out infection. Doubt. No significant diarrhea. Continue current GI medications for now. She may benefit from the addition of topical mesalamine such as [place] enema therapy when her blood counts recover. She is tolerating diet. We will follow. Thank you  Docia Chuck. Geri Seminole., M.D. [facility]

## 2016-06-08 NOTE — Progress Notes (Signed)
Initial Nutrition Assessment  DOCUMENTATION CODES:   Obesity unspecified  INTERVENTION:   Provide Beneprotein powder with each meal, each packet provides 25 kcal and 6g protein Reviewed protein supplement options with patient and pt's husband RD to continue to monitor for needs  NUTRITION DIAGNOSIS:   Increased nutrient needs related to cancer and cancer related treatments as evidenced by estimated needs.  GOAL:   Patient will meet greater than or equal to 90% of their needs  MONITOR:   PO intake, Supplement acceptance, Labs, Weight trends, I & O's  REASON FOR ASSESSMENT:   Malnutrition Screening Tool    ASSESSMENT:   56 y.o. female with medical history significant of stage 2 breast cancer on chemo, ulcerative colitis, DM comes in with about 15 episodes of bleeding from rectum started this am.  No fevers.  Has abdominal cramps in the lower quadrant only when defacating.  No n /v. Has been doing well with her chemo treatment.  Has been on prednisone 40mg  po daily for her UC.  Both GI and oncology were called concerning patient.  Pt referred for admission for GIB in the setting of ulcerative colitis, neutropenia.  Patient in room with husband at bedside. Pt reports eating but not eating enough protein with what she is eating. She states she likes Oikos Zero yogurts for a snack. She does not like most protein shakes such as Boost or Ensure as they are too sweet. She also states she cannot eat certain things d/t foods having a metallic taste to them. Pt tries to watch what she eats as well d/t her ulcerative colitis. Pt has been having diarrhea for some time now.  Pt is willing to try Beneprotein powder that she can add to foods on her meal trays to boost the protein content. Reviewed other protein supplement options with pt to try at home. Encouraged pt to consume her yogurts 2-3 times a day to act as a protein supplement.   Per chart review, pt has lost 16 lb since 4/20 (8% wt loss  x 4 weeks, significant for time frame). Nutrition focused physical exam shows no sign of depletion of muscle mass or body fat.  Medications: K-DUR tablet every 4 hours Labs reviewed: CBGs: 130-189 Low K, Mg  Diet Order:  Diet Carb Modified Fluid consistency: Thin; Room service appropriate? Yes  Skin:  Reviewed, no issues  Last BM:  5/11  Height:   Ht Readings from Last 1 Encounters:  06/07/16 5' 4.75" (1.645 m)    Weight:   Wt Readings from Last 1 Encounters:  06/07/16 197 lb 14.4 oz (89.8 kg)    Ideal Body Weight:  54.5 kg  BMI:  Body mass index is 33.19 kg/m.  Estimated Nutritional Needs:   Kcal:  1950-2150  Protein:  80-90g  Fluid:  2L/day  EDUCATION NEEDS:   Education needs addressed  Brandi Bibles, MS, RD, LDN Pager: [phone] After Hours Pager: [phone]

## 2016-06-08 NOTE — Progress Notes (Signed)
Brandi Dickson   DOB:August 17, 1960   SW#:[id number]   DUK#:[id number]  Oncology follow up  Subjective: Brandi Dickson presented to ED yesterday for severe diarrhea and hematochezia. She received a second cycle chemotherapy on May 4, did well until yesterday when she developed severe watery diarrhea with bloody stool, about 10 times yesterday, with mild abdominal cramps. No fever or chills. She feels better today, still has loose bowel movement, 5-6 times today, but hematochezia has resolved. She feels better overall.   Objective:  Vitals:   06/08/16 0547 06/08/16 1405  BP: 113/73 112/67  Pulse: (!) 124 88  Resp: 20 18  Temp: 98.7 F (37.1 C) 99.4 F (37.4 C)    Body mass index is 33.19 kg/m.  Intake/Output Summary (Last 24 hours) at 06/08/16 2125 Last data filed at 06/08/16 1800  Gross per 24 hour  Intake             1430 ml  Output              200 ml  Net             1230 ml     Sclerae unicteric  Oropharynx clear  No peripheral adenopathy  Lungs clear -- no rales or rhonchi  Heart regular rate and rhythm  Abdomen benign  MSK no focal spinal tenderness, no peripheral edema  Neuro nonfocal    CBG (last 3)   Recent Labs  06/08/16 0802 06/08/16 1144 06/08/16 1702  GLUCAP 130* 189* 228*     Labs:  Lab Results  Component Value Date   WBC 2.2 (L) 06/08/2016   HGB 9.7 (L) 06/08/2016   HCT 30.6 (L) 06/08/2016   MCV 87.7 06/08/2016   PLT 68 (L) 06/08/2016   NEUTROABS 0.2 (L) 06/07/2016   CMP Latest Ref Rng & Units 06/08/2016 06/07/2016 05/31/2016  Glucose 65 - 99 mg/dL 113(H) 196(H) 168(H)  BUN 6 - 20 mg/dL 14 17 16.7  Creatinine 0.44 - 1.00 mg/dL 0.65 0.78 0.8  Sodium 135 - 145 mmol/L 137 135 140  Potassium 3.5 - 5.1 mmol/L 2.8(L) 3.5 3.6  Chloride 101 - 111 mmol/L 102 103 -  CO2 22 - 32 mmol/L _0 Calcium 8.9 - 10.3 mg/dL 8.2(L) 8.7(L) 9.1  Total Protein 6.5 - 8.1 g/dL - 6.3(L) 6.3(L)  Total Bilirubin 0.3 - 1.2 mg/dL - 0.8 0.30  Alkaline Phos 38 - 126 U/L - 71  90  AST 15 - 41 U/L - 19 11  ALT 14 - 54 U/L - 20 18     Urine Studies No results for input(s): UHGB, CRYS in the last 72 hours.  Invalid input(s): UACOL, UAPR, USPG, UPH, UTP, UGL, UKET, UBIL, UNIT, UROB, ULEU, UEPI, UWBC, URBC, UBAC, CAST, UCOM, BILUA  Basic Metabolic Panel:  Recent Labs Lab 06/07/16 1431 06/08/16 0500  NA 135 137  K 3.5 2.8*  CL 103 102  CO2 23 27  GLUCOSE 196* 113*  BUN 17 14  CREATININE 0.78 0.65  CALCIUM 8.7* 8.2*  MG  --  1.4*   GFR Estimated Creatinine Clearance: 85.5 mL/min (by C-G formula based on SCr of 0.65 mg/dL). Liver Function Tests:  Recent Labs Lab 06/07/16 1431  AST 19  ALT 20  ALKPHOS 71  BILITOT 0.8  PROT 6.3*  ALBUMIN 3.0*    Recent Labs Lab 06/07/16 1431  LIPASE 18   No results for input(s): AMMONIA in the last 168 hours. Coagulation profile No results for input(s): INR, PROTIME  in the last 168 hours.  CBC:  Recent Labs Lab 06/07/16 1431 06/07/16 1848 06/08/16 0500  WBC 0.8* 1.3* 2.2*  NEUTROABS 0.1* 0.2*  --   HGB 11.5* 11.0* 9.7*  HCT 34.8* 33.4* 30.6*  MCV 87.0 86.5 87.7  PLT 78* 79* 68*   Cardiac Enzymes: No results for input(s): CKTOTAL, CKMB, CKMBINDEX, TROPONINI in the last 168 hours. BNP: Invalid input(s): POCBNP CBG:  Recent Labs Lab 06/07/16 2222 06/08/16 0802 06/08/16 1144 06/08/16 1702  GLUCAP 149* 130* 189* 228*   D-Dimer No results for input(s): DDIMER in the last 72 hours. Hgb A1c No results for input(s): HGBA1C in the last 72 hours. Lipid Profile No results for input(s): CHOL, HDL, LDLCALC, TRIG, CHOLHDL, LDLDIRECT in the last 72 hours. Thyroid function studies No results for input(s): TSH, T4TOTAL, T3FREE, THYROIDAB in the last 72 hours.  Invalid input(s): FREET3 Anemia work up  Recent Labs  06/08/16 1145  VITAMINB12 1,455*  FOLATE 10.5  FERRITIN 311*  TIBC 277  IRON 20*   Microbiology Recent Results (from the past 240 hour(s))  C difficile quick scan w PCR  reflex     Status: None   Collection Time: 06/08/16  1:05 PM  Result Value Ref Range Status   C Diff antigen NEGATIVE NEGATIVE Final   C Diff toxin NEGATIVE NEGATIVE Final   C Diff interpretation No C. difficile detected.  Final      Studies:  Ct Abdomen Pelvis W Contrast  Result Date: 06/07/2016 CLINICAL DATA:  "Patient presents to the emergency room with C/O blood in stool. Pt states she has been passing blood since approximately 0900 am this morning. Pt was seen for something similar about 3 weeks ago. Pt reports the blood is bright red. Pt also has a hx of ulcerative colitis. Pt has a hx of breast cancer and is actively receiving chemotherapy. She has chemo last Friday and has it every 3 weeks." EXAM: CT ABDOMEN AND PELVIS WITH CONTRAST TECHNIQUE: Multidetector CT imaging of the abdomen and pelvis was performed using the standard protocol following bolus administration of intravenous contrast. CONTRAST:  <See Chart> ISOVUE-300 IOPAMIDOL (ISOVUE-300) INJECTION 61% COMPARISON:  07/15/2015 FINDINGS: Lower chest: Clear lung bases.  Heart is normal in size. Hepatobiliary: Liver is normal in size and overall parenchymal echogenicity. There are several small hypoattenuating lesions, largest at posterior dome of the right lobe measuring 6 mm. Small lesions in the central right lobe, lateral segment of the left lobe and inferior medial segmental left lobe or not evident on the prior CT. No other liver abnormality. Normal gallbladder. No bile duct dilation. Pancreas: Unremarkable. No pancreatic ductal dilatation or surrounding inflammatory changes. Spleen: Normal in size without focal abnormality. Adrenals/Urinary Tract: Hypoattenuating, 11 mm left adrenal mass. The stability from the prior exam supports an adenoma. Fat density 9 mm right adrenal mass reflecting a mild lipoma, also stable. 1 cm lower pole left renal cyst. No other renal masses, no stones and no hydronephrosis. Normal ureters. Bladder is  unremarkable but suboptimally visualized due to artifact from bilateral hip prosthesis. Stomach/Bowel: There is wall thickening of the colon. The left colon is mostly decompressed making evaluation for wall thickening problematic. The lower descending and sigmoid colon show more definitive mild wall thickening with the wall measuring 45 mm. The transverse and right colon short thin wall with the exception of a portion of the upper ascending colon with there is irregular narrowing evidence of some wall thickening. This persists on the delayed sequence. This  still may reflect a spasm. Constricting mass is not excluded. Stomach, duodenum and small bowel are unremarkable. Normal appendix is visualized. Vascular/Lymphatic: There several subcentimeter but somewhat prominent gastrohepatic ligament lymph nodes that are stable from the prior exam. There are no enlarged lymph nodes. Minor atherosclerotic plaque is noted along the abdominal aorta. No aneurysm. Reproductive: Status post hysterectomy. No adnexal masses. Other: No abdominal wall hernia or abnormality. No abdominopelvic ascites. Musculoskeletal: Bilateral total hip prosthesis are incompletely imaged, of fall where visualized appear to be well-seated and well-aligned. There is no acute fracture. Bones are demineralized is somewhat heterogeneous distribution, but there is no defined osteolytic lesion. No osteoblastic lesions. IMPRESSION: 1. Wall thickening of the descending and sigmoid colon consistent with an infectious or inflammatory colitis. 2. There is also a short segment of narrowing and possible wall thickening of the superior ascending colon which could reflect a constricting mass or, more likely, be an area of persistent colonic spasm. This could be further assessed with either colonoscopy or a barium enema after the current symptoms of colitis have resolved. 3. Small, subcentimeter, low-density liver lesions which may all be benign. However, 3 these are  not evident on prior CT. Liver metastatic disease possible. These could be further assessed with liver MRI with and without contrast. 4. Adrenal lesions which are stable, on the right and myelolipoma and on the left most likely an adenoma. Electronically Signed   By: Lajean Manes M.D.   On: 06/07/2016 16:23    Assessment: 56 y.o. female with past medical history of ulcerative colitis, DM, recently diagnosed breast cancer, currently on neoadjuvant chemotherapy  1. Severe diarrhea and hematochezia secondary to exacerbation of ulcerative colitis and chemotherapy 2. Hematochezia secondary to colitis and thrombocytopenia 3. Pancytopenia secondary to chemotherapy 4. Type 2 diabetes 5. Stage II a breast cancer, ER-/PR-/HER2+, currently on neoadjuvant chemotherapy   Plan:  -Her hematochezia has resolved, diarrhea also improved today -C. difficile was negative, GI panel results pending -Continue supportive care with IV fluids, consider restart Imodium and Lomotil if her GI panel negative -She received Neulasta, no need for Neupogen -Appreciate GI input and assistance  -CT scan findings reviewed, I will get a abdominal MRI as outpt for further evaluation  -we discussed further reduce chemo dose or deescalate to Taxol and herceptin, to make it more tolerable, will decide on her next follow up visit, depends on her recovery  -I will follow up in a week if she is discharged home over the weekend.   Truitt Merle, MD 06/08/2016  9:25 PM

## 2016-06-08 NOTE — Progress Notes (Signed)
PROGRESS NOTE    Brandi Dickson  RCB:[id number] DOB: Jan 19, 1961 DOA: 06/07/2016 PCP: Orpah Melter, MD    Brief Narrative:  Patient is a 56 year old lady with history of stage II breast cancer on chemotherapy, ulcerative colitis, diabetes presented to the ED with hematochezia. Patient also noted to have abdominal cramps. GI pathogen panel pending. C. difficile PCR negative. CT abdomen and pelvis which was done on admission revealing wall thickening of the descending and sigmoid: As well as narrowing and possible wall thickening superior descending colon. Gastroenterology following.   Assessment & Plan:   Principal Problem:   Hematochezia Active Problems:   Breast cancer of upper-outer quadrant of left female breast ([facility])   Ulcerative colitis without complications ([place])   Hypertension   Diabetes mellitus without complication ([place])   GIB (gastrointestinal bleeding)   #1 hematochezia Questionable etiology. Patient states last bloody bowel movement was last night. Patient states having brown bloody bowel movements. C. difficile PCR negative. GI pathogen panel pending. CT abdomen and pelvis done on admission with wall thickening of the descending and sigmoid colon consistent with an infectious or inflammatory colitis, short segment of narrowing and possible wall thickening in the superior ascending colon could reflect a constricting mass or more likely an area of persistent colonic spasms. 3 small subcentimeter to low density liver lesions noted on CT which may be benign. Adrenal lesions are stable. Hemoglobin currently at 9.7 from 11.5 on admission. Anemia panel with a iron level of 20. Patient has been seen in consultation by GI. Continue prednisone for now. If infectious workup is negative GI recommending IV Solu-Medrol. Follow for now. Per GI.  #2 history of breast cancer Outpatient follow-up with oncology.  #3 hypokalemia/hypomagnesemia Likely secondary to GI losses. Replete.  #4  pancytopenia Secondary to recent chemotherapy. Patient status post Neulasta. White count up to 2.2 from 1.3. Platelet count is 68. Hemoglobin and 9.7. Follow for now.  #5 hypertension Stable continue Maxide. Resume Lopressor.  #6 tachycardia Likely a reflex tachycardia as patient was on beta blocker prior to admission. Resume beta blocker.  #7 liver lesions on CT scan Outpatient follow-up with hematology/oncology.  #8 well-controlled diabetes mellitus Hemoglobin A1c 5.7 05/19/2016. CBGs have ranged from 149-189. Patient on prednisone. Sliding scale insulin.   DVT prophylaxis: SCDs Code Status: Full Family Communication: Updated patient and husband at bedside. Disposition Plan: Home when medically stable, bloody bowel movements have resolved, diarrhea improved and per gastroenterology.   Consultants:   Gastroenterology: Dr. Henrene Pastor 06/08/2016  Oncology: Dr. Jana Hakim 06/07/2016  Procedures:   CT abdomen and pelvis 06/07/2016    Antimicrobials:   None   Subjective: Patient states had bloody bowel movements prior to admission last last night after being admitted into the hospital. Patient stated had brown watery bowel movements today. Patient with some lower abdominal pain.  Objective: Vitals:   06/07/16 1900 06/07/16 2047 06/07/16 2210 06/08/16 0547  BP: 115/78 121/74 124/74 113/73  Pulse: 83 (!) 108 (!) 107 (!) 124  Resp: 16 18 20 20   Temp:  98.6 F (37 C) 98.8 F (37.1 C) 98.7 F (37.1 C)  TempSrc:  Oral Oral Oral  SpO2:  97% 94% 98%  Weight:   89.8 kg (197 lb 14.4 oz)   Height:   5' 4.75" (1.645 m)     Intake/Output Summary (Last 24 hours) at 06/08/16 1222 Last data filed at 06/08/16 1150  Gross per 24 hour  Intake  1480 ml  Output              200 ml  Net             1280 ml   Filed Weights   06/07/16 1330 06/07/16 2210  Weight: 85.7 kg (189 lb) 89.8 kg (197 lb 14.4 oz)    Examination:  General exam: Appears calm and comfortable    Respiratory system: Clear to auscultation. Respiratory effort normal. Cardiovascular system: S1 & S2 heard, RRR. No JVD, murmurs, rubs, gallops or clicks. No pedal edema. Gastrointestinal system: Abdomen is nondistended, soft and nontender. No organomegaly or masses felt. Normal bowel sounds heard. Central nervous system: Alert and oriented. No focal neurological deficits. Extremities: Symmetric 5 x 5 power. Skin: No rashes, lesions or ulcers Psychiatry: Judgement and insight appear normal. Mood & affect appropriate.     Data Reviewed: I have personally reviewed following labs and imaging studies  CBC:  Recent Labs Lab 06/07/16 1431 06/07/16 1848 06/08/16 0500  WBC 0.8* 1.3* 2.2*  NEUTROABS 0.1* 0.2*  --   HGB 11.5* 11.0* 9.7*  HCT 34.8* 33.4* 30.6*  MCV 87.0 86.5 87.7  PLT 78* 79* 68*   Basic Metabolic Panel:  Recent Labs Lab 06/07/16 1431 06/08/16 0500  NA 135 137  K 3.5 2.8*  CL 103 102  CO2 23 27  GLUCOSE 196* 113*  BUN 17 14  CREATININE 0.78 0.65  CALCIUM 8.7* 8.2*  MG  --  1.4*   GFR: Estimated Creatinine Clearance: 85.5 mL/min (by C-G formula based on SCr of 0.65 mg/dL). Liver Function Tests:  Recent Labs Lab 06/07/16 1431  AST 19  ALT 20  ALKPHOS 71  BILITOT 0.8  PROT 6.3*  ALBUMIN 3.0*    Recent Labs Lab 06/07/16 1431  LIPASE 18   No results for input(s): AMMONIA in the last 168 hours. Coagulation Profile: No results for input(s): INR, PROTIME in the last 168 hours. Cardiac Enzymes: No results for input(s): CKTOTAL, CKMB, CKMBINDEX, TROPONINI in the last 168 hours. BNP (last 3 results) No results for input(s): PROBNP in the last 8760 hours. HbA1C: No results for input(s): HGBA1C in the last 72 hours. CBG:  Recent Labs Lab 06/07/16 2222 06/08/16 0802 06/08/16 1144  GLUCAP 149* 130* 189*   Lipid Profile: No results for input(s): CHOL, HDL, LDLCALC, TRIG, CHOLHDL, LDLDIRECT in the last 72 hours. Thyroid Function Tests: No  results for input(s): TSH, T4TOTAL, FREET4, T3FREE, THYROIDAB in the last 72 hours. Anemia Panel: No results for input(s): VITAMINB12, FOLATE, FERRITIN, TIBC, IRON, RETICCTPCT in the last 72 hours. Sepsis Labs:  Recent Labs Lab 06/07/16 2040  LATICACIDVEN 2.31*    No results found for this or any previous visit (from the past 240 hour(s)).       Radiology Studies: Ct Abdomen Pelvis W Contrast  Result Date: 06/07/2016 CLINICAL DATA:  "Patient presents to the emergency room with C/O blood in stool. Pt states she has been passing blood since approximately 0900 am this morning. Pt was seen for something similar about 3 weeks ago. Pt reports the blood is bright red. Pt also has a hx of ulcerative colitis. Pt has a hx of breast cancer and is actively receiving chemotherapy. She has chemo last Friday and has it every 3 weeks." EXAM: CT ABDOMEN AND PELVIS WITH CONTRAST TECHNIQUE: Multidetector CT imaging of the abdomen and pelvis was performed using the standard protocol following bolus administration of intravenous contrast. CONTRAST:  <See Chart> ISOVUE-300 IOPAMIDOL (ISOVUE-300)  INJECTION 61% COMPARISON:  07/15/2015 FINDINGS: Lower chest: Clear lung bases.  Heart is normal in size. Hepatobiliary: Liver is normal in size and overall parenchymal echogenicity. There are several small hypoattenuating lesions, largest at posterior dome of the right lobe measuring 6 mm. Small lesions in the central right lobe, lateral segment of the left lobe and inferior medial segmental left lobe or not evident on the prior CT. No other liver abnormality. Normal gallbladder. No bile duct dilation. Pancreas: Unremarkable. No pancreatic ductal dilatation or surrounding inflammatory changes. Spleen: Normal in size without focal abnormality. Adrenals/Urinary Tract: Hypoattenuating, 11 mm left adrenal mass. The stability from the prior exam supports an adenoma. Fat density 9 mm right adrenal mass reflecting a mild lipoma, also  stable. 1 cm lower pole left renal cyst. No other renal masses, no stones and no hydronephrosis. Normal ureters. Bladder is unremarkable but suboptimally visualized due to artifact from bilateral hip prosthesis. Stomach/Bowel: There is wall thickening of the colon. The left colon is mostly decompressed making evaluation for wall thickening problematic. The lower descending and sigmoid colon show more definitive mild wall thickening with the wall measuring 45 mm. The transverse and right colon short thin wall with the exception of a portion of the upper ascending colon with there is irregular narrowing evidence of some wall thickening. This persists on the delayed sequence. This still may reflect a spasm. Constricting mass is not excluded. Stomach, duodenum and small bowel are unremarkable. Normal appendix is visualized. Vascular/Lymphatic: There several subcentimeter but somewhat prominent gastrohepatic ligament lymph nodes that are stable from the prior exam. There are no enlarged lymph nodes. Minor atherosclerotic plaque is noted along the abdominal aorta. No aneurysm. Reproductive: Status post hysterectomy. No adnexal masses. Other: No abdominal wall hernia or abnormality. No abdominopelvic ascites. Musculoskeletal: Bilateral total hip prosthesis are incompletely imaged, of fall where visualized appear to be well-seated and well-aligned. There is no acute fracture. Bones are demineralized is somewhat heterogeneous distribution, but there is no defined osteolytic lesion. No osteoblastic lesions. IMPRESSION: 1. Wall thickening of the descending and sigmoid colon consistent with an infectious or inflammatory colitis. 2. There is also a short segment of narrowing and possible wall thickening of the superior ascending colon which could reflect a constricting mass or, more likely, be an area of persistent colonic spasm. This could be further assessed with either colonoscopy or a barium enema after the current symptoms  of colitis have resolved. 3. Small, subcentimeter, low-density liver lesions which may all be benign. However, 3 these are not evident on prior CT. Liver metastatic disease possible. These could be further assessed with liver MRI with and without contrast. 4. Adrenal lesions which are stable, on the right and myelolipoma and on the left most likely an adenoma. Electronically Signed   By: Lajean Manes M.D.   On: 06/07/2016 16:23        Scheduled Meds: . gabapentin  200 mg Oral QHS  . insulin aspart  0-9 Units Subcutaneous TID WC  . metoprolol tartrate  25 mg Oral BID  . potassium chloride  40 mEq Oral Q4H  . predniSONE  40 mg Oral Q breakfast  . sodium chloride flush  3 mL Intravenous Q12H  . triamterene-hydrochlorothiazide  1 tablet Oral Daily   Continuous Infusions: . sodium chloride    . magnesium sulfate 1 - 4 g bolus IVPB       LOS: 1 day    Time spent: 35 minutes    THOMPSON,DANIEL, MD [facility]  Pager [phone]  If 7PM-7AM, please contact night-coverage [email or web address] Password [facility] 06/08/2016, 12:22 PM

## 2016-06-09 DIAGNOSIS — I10 Essential (primary) hypertension: Secondary | ICD-10-CM | POA: Diagnosis not present

## 2016-06-09 DIAGNOSIS — K5190 Ulcerative colitis, unspecified, without complications: Secondary | ICD-10-CM | POA: Diagnosis not present

## 2016-06-09 DIAGNOSIS — K5130 Ulcerative (chronic) rectosigmoiditis without complications: Secondary | ICD-10-CM | POA: Diagnosis not present

## 2016-06-09 DIAGNOSIS — K921 Melena: Secondary | ICD-10-CM | POA: Diagnosis not present

## 2016-06-09 LAB — CBC WITH DIFFERENTIAL/PLATELET
BASOS PCT: 0 %
Basophils Absolute: 0 10*3/uL (ref 0.0–0.1)
EOS ABS: 0 10*3/uL (ref 0.0–0.7)
Eosinophils Relative: 0 %
HCT: 31.2 % — ABNORMAL LOW (ref 36.0–46.0)
HEMOGLOBIN: 10 g/dL — AB (ref 12.0–15.0)
Lymphocytes Relative: 50 %
Lymphs Abs: 2.5 10*3/uL (ref 0.7–4.0)
MCH: 28.2 pg (ref 26.0–34.0)
MCHC: 32.1 g/dL (ref 30.0–36.0)
MCV: 87.9 fL (ref 78.0–100.0)
Monocytes Absolute: 1 10*3/uL (ref 0.1–1.0)
Monocytes Relative: 19 %
NEUTROS ABS: 1.6 10*3/uL — AB (ref 1.7–7.7)
NEUTROS PCT: 31 %
PLATELETS: 84 10*3/uL — AB (ref 150–400)
RBC: 3.55 MIL/uL — ABNORMAL LOW (ref 3.87–5.11)
RDW: 14.6 % (ref 11.5–15.5)
WBC: 5.1 10*3/uL (ref 4.0–10.5)

## 2016-06-09 LAB — MAGNESIUM: MAGNESIUM: 1.8 mg/dL (ref 1.7–2.4)

## 2016-06-09 LAB — BASIC METABOLIC PANEL
Anion gap: 10 (ref 5–15)
BUN: 14 mg/dL (ref 6–20)
CHLORIDE: 102 mmol/L (ref 101–111)
CO2: 26 mmol/L (ref 22–32)
CREATININE: 0.63 mg/dL (ref 0.44–1.00)
Calcium: 8.6 mg/dL — ABNORMAL LOW (ref 8.9–10.3)
GFR calc non Af Amer: >60 mL/min (ref >60)
GLUCOSE: 112 mg/dL — AB (ref 65–99)
POTASSIUM: 3.4 mmol/L — AB (ref 3.5–5.1)
SODIUM: 138 mmol/L (ref 135–145)

## 2016-06-09 LAB — GASTROINTESTINAL PANEL BY PCR, STOOL (REPLACES STOOL CULTURE)
Adenovirus F40/41: NOT DETECTED
Astrovirus: NOT DETECTED
CRYPTOSPORIDIUM: NOT DETECTED
Campylobacter species: NOT DETECTED
Cyclospora cayetanensis: NOT DETECTED
ENTEROAGGREGATIVE E COLI (EAEC): NOT DETECTED
Entamoeba histolytica: NOT DETECTED
Enteropathogenic E coli (EPEC): NOT DETECTED
Enterotoxigenic E coli (ETEC): NOT DETECTED
GIARDIA LAMBLIA: NOT DETECTED
Norovirus GI/GII: NOT DETECTED
Plesimonas shigelloides: NOT DETECTED
ROTAVIRUS A: NOT DETECTED
SALMONELLA SPECIES: NOT DETECTED
SAPOVIRUS (I, II, IV, AND V): NOT DETECTED
SHIGELLA/ENTEROINVASIVE E COLI (EIEC): NOT DETECTED
Shiga like toxin producing E coli (STEC): NOT DETECTED
Vibrio cholerae: NOT DETECTED
Vibrio species: NOT DETECTED
YERSINIA ENTEROCOLITICA: NOT DETECTED

## 2016-06-09 LAB — GLUCOSE, CAPILLARY: Glucose-Capillary: 150 mg/dL — ABNORMAL HIGH (ref 65–99)

## 2016-06-09 MED ORDER — HEPARIN SOD (PORK) LOCK FLUSH 100 UNIT/ML IV SOLN
500.0000 [IU] | Freq: Once | INTRAVENOUS | Status: AC
  Administered 2016-06-09: 500 [IU] via INTRAVENOUS
  Filled 2016-06-09: qty 5

## 2016-06-09 NOTE — Progress Notes (Signed)
[facility] Progress Note  Chief Complaint:    Rectal bleeding  Subjective: feels great. Wants to go home. No further bleeding. Bowels at baseline (loose)  Objective:  Vital signs in last 24 hours: Temp:  [98.1 F (36.7 C)-99.4 F (37.4 C)] 98.1 F (36.7 C) (05/12 0612) Pulse Rate:  [87-99] 87 (05/12 0612) Resp:  [18-20] 20 (05/12 0612) BP: (112-127)/(66-79) 112/66 (05/12 0612) SpO2:  [99 %-100 %] 100 % (05/12 0612) Last BM Date: 06/09/16 General:   Alert, well-developed white female in NAD EENT:  Normal hearing, non icteric sclera, conjunctive pink.  Heart:  Regular rate and rhythm; no murmurs. No lower extremity edema Pulm: Normal respiratory effort Abdomen:  Soft, nondistended, nontender.  Normal bowel sounds, no masses felt. No hepatomegaly.    Neurologic:  Alert and  oriented x4;  grossly normal neurologically. Psych:  Alert and cooperative. Normal mood and affect.   Intake/Output from previous day: 05/11 0701 - 05/12 0700 In: 1310 [P.O.:1110; I.V.:200] Out: 200 [Urine:200] Intake/Output this shift: Total I/O In: 240 [P.O.:240] Out: -   Lab Results:  Recent Labs  06/07/16 1848 06/08/16 0500 06/09/16 0500  WBC 1.3* 2.2* 5.1  HGB 11.0* 9.7* 10.0*  HCT 33.4* 30.6* 31.2*  PLT 79* 68* 84*   BMET  Recent Labs  06/07/16 1431 06/08/16 0500 06/09/16 0500  NA 135 137 138  K 3.5 2.8* 3.4*  CL 103 102 102  CO2 23 27 26   GLUCOSE 196* 113* 112*  BUN 17 14 14   CREATININE 0.78 0.65 0.63  CALCIUM 8.7* 8.2* 8.6*   LFT  Recent Labs  06/07/16 1431  PROT 6.3*  ALBUMIN 3.0*  AST 19  ALT 20  ALKPHOS 71  BILITOT 0.8   PT/INR No results for input(s): LABPROT, INR in the last 72 hours. Hepatitis Panel No results for input(s): HEPBSAG, HCVAB, HEPAIGM, HEPBIGM in the last 72 hours.  Ct Abdomen Pelvis W Contrast  Result Date: 06/07/2016 CLINICAL DATA:  "Patient presents to the emergency room with C/O blood in stool. Pt states she has  been passing blood since approximately 0900 am this morning. Pt was seen for something similar about 3 weeks ago. Pt reports the blood is bright red. Pt also has a hx of ulcerative colitis. Pt has a hx of breast cancer and is actively receiving chemotherapy. She has chemo last Friday and has it every 3 weeks." EXAM: CT ABDOMEN AND PELVIS WITH CONTRAST TECHNIQUE: Multidetector CT imaging of the abdomen and pelvis was performed using the standard protocol following bolus administration of intravenous contrast. CONTRAST:  <See Chart> ISOVUE-300 IOPAMIDOL (ISOVUE-300) INJECTION 61% COMPARISON:  07/15/2015 FINDINGS: Lower chest: Clear lung bases.  Heart is normal in size. Hepatobiliary: Liver is normal in size and overall parenchymal echogenicity. There are several small hypoattenuating lesions, largest at posterior dome of the right lobe measuring 6 mm. Small lesions in the central right lobe, lateral segment of the left lobe and inferior medial segmental left lobe or not evident on the prior CT. No other liver abnormality. Normal gallbladder. No bile duct dilation. Pancreas: Unremarkable. No pancreatic ductal dilatation or surrounding inflammatory changes. Spleen: Normal in size without focal abnormality. Adrenals/Urinary Tract: Hypoattenuating, 11 mm left adrenal mass. The stability from the prior exam supports an adenoma. Fat density 9 mm right adrenal mass reflecting a mild lipoma, also stable. 1 cm lower pole left renal cyst. No other renal masses, no stones and no hydronephrosis. Normal ureters. Bladder is unremarkable but suboptimally visualized  due to artifact from bilateral hip prosthesis. Stomach/Bowel: There is wall thickening of the colon. The left colon is mostly decompressed making evaluation for wall thickening problematic. The lower descending and sigmoid colon show more definitive mild wall thickening with the wall measuring 45 mm. The transverse and right colon short thin wall with the exception of a  portion of the upper ascending colon with there is irregular narrowing evidence of some wall thickening. This persists on the delayed sequence. This still may reflect a spasm. Constricting mass is not excluded. Stomach, duodenum and small bowel are unremarkable. Normal appendix is visualized. Vascular/Lymphatic: There several subcentimeter but somewhat prominent gastrohepatic ligament lymph nodes that are stable from the prior exam. There are no enlarged lymph nodes. Minor atherosclerotic plaque is noted along the abdominal aorta. No aneurysm. Reproductive: Status post hysterectomy. No adnexal masses. Other: No abdominal wall hernia or abnormality. No abdominopelvic ascites. Musculoskeletal: Bilateral total hip prosthesis are incompletely imaged, of fall where visualized appear to be well-seated and well-aligned. There is no acute fracture. Bones are demineralized is somewhat heterogeneous distribution, but there is no defined osteolytic lesion. No osteoblastic lesions. IMPRESSION: 1. Wall thickening of the descending and sigmoid colon consistent with an infectious or inflammatory colitis. 2. There is also a short segment of narrowing and possible wall thickening of the superior ascending colon which could reflect a constricting mass or, more likely, be an area of persistent colonic spasm. This could be further assessed with either colonoscopy or a barium enema after the current symptoms of colitis have resolved. 3. Small, subcentimeter, low-density liver lesions which may all be benign. However, 3 these are not evident on prior CT. Liver metastatic disease possible. These could be further assessed with liver MRI with and without contrast. 4. Adrenal lesions which are stable, on the right and myelolipoma and on the left most likely an adenoma. Electronically Signed   By: Lajean Manes M.D.   On: 06/07/2016 16:23    Assessment / Plan:  56 yo female with UC. Difficult time controlling symptoms since starting  chemo for breast cancer several months ago. This is second admission for flare type symptoms. C-diff is negative, path panel pending.  Infectious etiology seems unlikely, suspect this is UC flare given symptoms.  -stools back to baseline, loose ~ 4 times / day. No abdominal pain. Rectal bleeding has resolved. Hgb down about 1.5 - 2 grams overall but has stabilized. -continue mesalamine 4.8 grams daily -will start tapering Prednisone today. She will decrease to 30 mg daily. She already has the tapering schedule at home which we laid out to her in the office a few days prior to admission. I asked her to call our office on Monday to get a follow up in 2 weeks with myself or Alonza Bogus, PA. She knows to call in the interim for any recurrent symptoms.  . -Oncology evaluated her, will consider outpatient MRI for evaluation of liver lesions on CTscan.    Principal Problem:   Hematochezia Active Problems:   Breast cancer of upper-outer quadrant of left female breast ([place])   Ulcerative colitis without complications ([place])   Hypertension   Diabetes mellitus without complication ([place])   GIB (gastrointestinal bleeding)   Antineoplastic chemotherapy induced pancytopenia ([place])   LOS: 2 days   Tye Savoy NP 06/09/2016, 10:36 AM  Pager number [phone]  GI ATTENDING  Interval history data reviewed. Agree with interval progress note. Patient going home. Follow-up plans arranged. Outpatient GI follow-up with Dr. Fuller Plan and/or  his GI extenders regarding ongoing management of left-sided ulcerative colitis.  Docia Chuck. Geri [place]in[place]., M.D. [facility] Division of Gastroenterology

## 2016-06-09 NOTE — Discharge Summary (Signed)
Physician Discharge Summary  Brandi Dickson VXB:[id number] DOB: 01/10/1961 DOA: 06/07/2016  PCP: Orpah Melter, MD  Admit date: 06/07/2016 Discharge date: 06/09/2016  Admitted From: Home  Disposition: Home   Recommendations for Outpatient Follow-up:  1. Follow up with PCP in 1-week 2. Aspirin has been discontinued  Home Health: No  Equipment/Devices: No   Discharge Condition: Stable  CODE STATUS: Full  Diet recommendation: Regular   Brief/Interim Summary: 56 yo female presented with the chief complain of rectal bleeding. Had 15 episodes of rectal bleeding, associated with colicky abdominal pain worse with bowel movements. Patient known to have ulcerative colitis, T2DM and stage 2 breast cancer on chemotherapy. On the initial physical examination, patient was tachycardic at 113 with respiratory rate 18 and blood pressure 106/75. Moist mucous membranes, with clear lung fields, regular s1-s2. Abdomen non distended or tender, no lower extremity edema. Sodium 135, potassium 3.5, chloride 103, bicarbonate 23, glucose 196, BUN 17, creatinine 0.78, lipase 18, AST 19, ALT 20, white count 0.8, hemoglobin 11.5, hematocrit 34.8, platelets 78. Urinalysis negative for infection, stool for Clostridium difficile negative. CT of the abdomen with wall thickening of the descending and sigmoid colon, consistent with inflammatory process. Small subcentimeter, low-density liver lesions which may all be benign. EKG with a normal sinus rhythm, prolonged QT corrected to 534, poor R-wave progression on the precordial leads. Patient was admitted with the working diagnosis of ulcerative colitis flare.   1. Ulcerative colitis flare. Patient was admitted to medical floor, she was placed on intravenous fluids, she was continued on prednisone 40 mg daily, she had a significant improvement of her symptoms, no further diarrhea or bleeding. Patient will continue mesalamine 800 mg 3 times daily. Her hemoglobin remained stable  no need for blood transfusion.  2. Hypokalemia and hypomagnesemia. Electrolytes disturbances due to GI losses. Potassium and magnesium were corrected, her discharge potassium 3.4, magnesium 1.8. At this point patient tolerating by mouth diet adequately. No further diarrhea.  3. Pancytopenia. Aspirin was held due to thrombocytopenia, will continue follow-up as an outpatient with oncology. Patient received Neulasta as an outpatient.   4. Hypertension. Patient was continued on metoprolol and triamterene and hctz.  5. Type 2 diabetes mellitus. Patient was placed on insulin sliding scale for glucose coverage and monitoring, patient tolerated well steroid therapy. Patient will resume metformin by the time of discharge. Capillary glucose 228, 174, 150.  6. Breast cancer. Continue follow-up as an outpatient with oncology. Patient will have further liver imaging as an outpatient.  7. Iron deficiency anemia. Continue for sulfate 325 mg 3 times daily. Iron 27, TIBC 277, ferritin 311, transferrin saturation 7%.    Discharge Diagnoses:  Principal Problem:   Hematochezia Active Problems:   Breast cancer of upper-outer quadrant of left female breast ([place])   Ulcerative colitis without complications ([facility])   Hypertension   Diabetes mellitus without complication ([place])   GIB (gastrointestinal bleeding)   Antineoplastic chemotherapy induced pancytopenia ([place])    Discharge Instructions   Allergies as of 06/09/2016      Reactions   Lisinopril Palpitations   Augmentin [amoxicillin-pot Clavulanate] Other (See Comments)   sts gives her a yeast infection Has patient had a PCN reaction causing immediate rash, facial/tongue/throat swelling, SOB or lightheadedness with hypotension: no Has patient had a PCN reaction causing severe rash involving mucus membranes or skin necrosis: no Has patient had a PCN reaction that required hospitalization no Has patient had a PCN reaction occurring within the last 10  years: unknown If  all of the above answers are "NO", then may proceed with Cephalosporin use.   Benadryl [diphenhydramine] Itching, Anxiety   Per pt: "Makes my skin crawl"; makes pt sensitive to touch      Medication List    STOP taking these medications   ASPIRIN 81 PO     TAKE these medications   albuterol 108 (90 Base) MCG/ACT inhaler Commonly known as:  PROVENTIL HFA;VENTOLIN HFA Inhale 1-2 puffs into the lungs every 6 (six) hours as needed for wheezing or shortness of breath.   dexamethasone 4 MG tablet Commonly known as:  DECADRON Take 1 tablet (4 mg total) by mouth daily. Start the day before Taxotere. Then again the day after chemo for 3 days.   dicyclomine 20 MG tablet Commonly known as:  BENTYL Take 1 tablet (20 mg total) by mouth 4 (four) times daily -  before meals and at bedtime. What changed:  when to take this  reasons to take this   diphenoxylate-atropine 2.5-0.025 MG tablet Commonly known as:  LOMOTIL Take one to two tablets every six hours as needed for diarrhea   ferrous sulfate 325 (65 FE) MG tablet Commonly known as:  FERROUSUL Take 1 tablet (325 mg total) by mouth 3 (three) times daily with meals.   gabapentin 100 MG capsule Commonly known as:  NEURONTIN Take 200 mg by mouth at bedtime.   hydrocortisone 25 MG suppository Commonly known as:  ANUSOL-HC Place 1 suppository (25 mg total) rectally 2 (two) times daily.   lidocaine-prilocaine cream Commonly known as:  EMLA Apply 1 application to skin 1.5 to 2 hrs before use.  Cover to secure cream with plastic wrap.   loperamide 1 MG/5ML solution Commonly known as:  IMODIUM Take 3 mg by mouth as needed for diarrhea or loose stools.   LORazepam 0.5 MG tablet Commonly known as:  ATIVAN Take 1 tablet (0.5 mg total) by mouth once as needed for anxiety.   Mesalamine 800 MG Tbec Commonly known as:  ASACOL HD Take 2 tablets (1,600 mg total) by mouth 3 (three) times daily.   metFORMIN 500 MG 24 hr  tablet Commonly known as:  GLUCOPHAGE-XR Take 1,000 mg by mouth at bedtime.   metoprolol 50 MG tablet Commonly known as:  LOPRESSOR Take 50 mg by mouth at bedtime.   niacin 250 MG tablet Take 250 mg by mouth at bedtime.   ondansetron 8 MG tablet Commonly known as:  ZOFRAN Take 1 tablet (8 mg total) by mouth 2 (two) times daily as needed for refractory nausea / vomiting. Start on day 3 after chemo.   predniSONE 20 MG tablet Commonly known as:  DELTASONE Take 40 mg by mouth daily.   prochlorperazine 10 MG tablet Commonly known as:  COMPAZINE Take 1 tablet (10 mg total) by mouth every 6 (six) hours as needed (Nausea or vomiting).   traMADol 50 MG tablet Commonly known as:  ULTRAM Take 1-2 tablets (50-100 mg total) by mouth every 6 (six) hours as needed.   triamterene-hydrochlorothiazide 75-50 MG tablet Commonly known as:  MAXZIDE Take 0.5 tablets by mouth daily.   VITAMINS FOR THE HAIR Tabs Take 2 tablets by mouth 2 (two) times daily.       Allergies  Allergen Reactions  . Lisinopril Palpitations  . Augmentin [Amoxicillin-Pot Clavulanate] Other (See Comments)    sts gives her a yeast infection Has patient had a PCN reaction causing immediate rash, facial/tongue/throat swelling, SOB or lightheadedness with hypotension: no Has patient had a  PCN reaction causing severe rash involving mucus membranes or skin necrosis: no Has patient had a PCN reaction that required hospitalization no Has patient had a PCN reaction occurring within the last 10 years: unknown If all of the above answers are "NO", then may proceed with Cephalosporin use.   . Benadryl [Diphenhydramine] Itching and Anxiety    Per pt: "Makes my skin crawl"; makes pt sensitive to touch    Consultations:  Gastroenterology    Procedures/Studies: Dg Chest 2 View  Result Date: 05/13/2016 CLINICAL DATA:  Shortness of breath this morning EXAM: CHEST  2 VIEW COMPARISON:  05/09/2014 FINDINGS: Cardiomediastinal  silhouette is stable. Right IJ Port-A-Cath is unchanged in position. No infiltrate or pleural effusion. No pulmonary edema. Bony thorax is stable. IMPRESSION: No active cardiopulmonary disease. Electronically Signed   By: Lahoma Crocker M.D.   On: 05/13/2016 14:47   Ct Angio Chest Pe W And/or Wo Contrast  Result Date: 05/13/2016 CLINICAL DATA:  Shortness of Breath EXAM: CT ANGIOGRAPHY CHEST WITH CONTRAST TECHNIQUE: Multidetector CT imaging of the chest was performed using the standard protocol during bolus administration of intravenous contrast. Multiplanar CT image reconstructions and MIPs were obtained to evaluate the vascular anatomy. CONTRAST:  100 mL Isovue 370. COMPARISON:  Chest x-ray from earlier in the same day FINDINGS: Cardiovascular: Thoracic aorta shows a normal branching pattern with mild atherosclerotic changes in the descending thoracic aorta. No significant calcifications, aneurysmal dilatation or dissection is seen. The pulmonary artery shows a normal branching pattern without intraluminal filling defect to suggest pulmonary embolism. No cardiac enlargement is seen. No significant coronary calcifications are noted. A right chest wall port is seen in satisfactory position. Mediastinum/Nodes: The thoracic inlet is within normal limits. No sizable hilar or mediastinal adenopathy is noted. The esophagus as visualize is within normal limits. No axillary adenopathy is seen. Lungs/Pleura: Well aerated bilaterally without focal infiltrate or sizable effusion. No pneumothorax or parenchymal nodules are seen. Upper Abdomen: A hypodense 1.7 cm lesion is noted within the left adrenal gland likely representing a small adenoma. This is stable from a prior CT examination dated 07/15/2015. Musculoskeletal: No acute abnormality noted. Changes in the left breast are noted consistent with the given clinical history. Review of the MIP images confirms the above findings. IMPRESSION: No evidence of pulmonary emboli.  Left breast mass consistent with the given clinical history. Stable left adrenal lesion likely representing a small adenoma. Electronically Signed   By: Inez Catalina M.D.   On: 05/13/2016 17:57   Ct Abdomen Pelvis W Contrast  Result Date: 06/07/2016 CLINICAL DATA:  "Patient presents to the emergency room with C/O blood in stool. Pt states she has been passing blood since approximately 0900 am this morning. Pt was seen for something similar about 3 weeks ago. Pt reports the blood is bright red. Pt also has a hx of ulcerative colitis. Pt has a hx of breast cancer and is actively receiving chemotherapy. She has chemo last Friday and has it every 3 weeks." EXAM: CT ABDOMEN AND PELVIS WITH CONTRAST TECHNIQUE: Multidetector CT imaging of the abdomen and pelvis was performed using the standard protocol following bolus administration of intravenous contrast. CONTRAST:  <See Chart> ISOVUE-300 IOPAMIDOL (ISOVUE-300) INJECTION 61% COMPARISON:  07/15/2015 FINDINGS: Lower chest: Clear lung bases.  Heart is normal in size. Hepatobiliary: Liver is normal in size and overall parenchymal echogenicity. There are several small hypoattenuating lesions, largest at posterior dome of the right lobe measuring 6 mm. Small lesions in the central right lobe,  lateral segment of the left lobe and inferior medial segmental left lobe or not evident on the prior CT. No other liver abnormality. Normal gallbladder. No bile duct dilation. Pancreas: Unremarkable. No pancreatic ductal dilatation or surrounding inflammatory changes. Spleen: Normal in size without focal abnormality. Adrenals/Urinary Tract: Hypoattenuating, 11 mm left adrenal mass. The stability from the prior exam supports an adenoma. Fat density 9 mm right adrenal mass reflecting a mild lipoma, also stable. 1 cm lower pole left renal cyst. No other renal masses, no stones and no hydronephrosis. Normal ureters. Bladder is unremarkable but suboptimally visualized due to artifact from  bilateral hip prosthesis. Stomach/Bowel: There is wall thickening of the colon. The left colon is mostly decompressed making evaluation for wall thickening problematic. The lower descending and sigmoid colon show more definitive mild wall thickening with the wall measuring 45 mm. The transverse and right colon short thin wall with the exception of a portion of the upper ascending colon with there is irregular narrowing evidence of some wall thickening. This persists on the delayed sequence. This still may reflect a spasm. Constricting mass is not excluded. Stomach, duodenum and small bowel are unremarkable. Normal appendix is visualized. Vascular/Lymphatic: There several subcentimeter but somewhat prominent gastrohepatic ligament lymph nodes that are stable from the prior exam. There are no enlarged lymph nodes. Minor atherosclerotic plaque is noted along the abdominal aorta. No aneurysm. Reproductive: Status post hysterectomy. No adnexal masses. Other: No abdominal wall hernia or abnormality. No abdominopelvic ascites. Musculoskeletal: Bilateral total hip prosthesis are incompletely imaged, of fall where visualized appear to be well-seated and well-aligned. There is no acute fracture. Bones are demineralized is somewhat heterogeneous distribution, but there is no defined osteolytic lesion. No osteoblastic lesions. IMPRESSION: 1. Wall thickening of the descending and sigmoid colon consistent with an infectious or inflammatory colitis. 2. There is also a short segment of narrowing and possible wall thickening of the superior ascending colon which could reflect a constricting mass or, more likely, be an area of persistent colonic spasm. This could be further assessed with either colonoscopy or a barium enema after the current symptoms of colitis have resolved. 3. Small, subcentimeter, low-density liver lesions which may all be benign. However, 3 these are not evident on prior CT. Liver metastatic disease possible.  These could be further assessed with liver MRI with and without contrast. 4. Adrenal lesions which are stable, on the right and myelolipoma and on the left most likely an adenoma. Electronically Signed   By: Lajean Manes M.D.   On: 06/07/2016 16:23     Subjective: Patient feeling better, no nausea or vomiting, tolerating po well, ambulating. No diarrhea, abdominal pain or hematochezia.   Discharge Exam: Vitals:   06/08/16 2139 06/09/16 0612  BP: 127/79 112/66  Pulse: 99 87  Resp:  20  Temp:  98.1 F (36.7 C)   Vitals:   06/08/16 0547 06/08/16 1405 06/08/16 2139 06/09/16 0612  BP: 113/73 112/67 127/79 112/66  Pulse: (!) 124 88 99 87  Resp: 20 18  20   Temp: 98.7 F (37.1 C) 99.4 F (37.4 C)  98.1 F (36.7 C)  TempSrc: Oral Oral  Oral  SpO2: 98% 99%  100%  Weight:      Height:        General: Pt is alert, awake, not in acute distress E ENT. No pallor or icterus, oral mucosa moist.  Cardiovascular: RRR, S1/S2 +, no rubs, no gallops Respiratory: CTA bilaterally, no wheezing, no rhonchi Abdominal: Soft, NT, ND,  bowel sounds +, non distended and non tender Extremities: no edema, no cyanosis    The results of significant diagnostics from this hospitalization (including imaging, microbiology, ancillary and laboratory) are listed below for reference.     Microbiology: Recent Results (from the past 240 hour(s))  C difficile quick scan w PCR reflex     Status: None   Collection Time: 06/08/16  1:05 PM  Result Value Ref Range Status   C Diff antigen NEGATIVE NEGATIVE Final   C Diff toxin NEGATIVE NEGATIVE Final   C Diff interpretation No C. difficile detected.  Final     Labs: BNP (last 3 results)  Recent Labs  05/13/16 1431  BNP 46.8   Basic Metabolic Panel:  Recent Labs Lab 06/07/16 1431 06/08/16 0500 06/09/16 0500  NA 135 137 138  K 3.5 2.8* 3.4*  CL 103 102 102  CO2 23 27 26   GLUCOSE 196* 113* 112*  BUN 17 14 14   CREATININE 0.78 0.65 0.63  CALCIUM  8.7* 8.2* 8.6*  MG  --  1.4* 1.8   Liver Function Tests:  Recent Labs Lab 06/07/16 1431  AST 19  ALT 20  ALKPHOS 71  BILITOT 0.8  PROT 6.3*  ALBUMIN 3.0*    Recent Labs Lab 06/07/16 1431  LIPASE 18   No results for input(s): AMMONIA in the last 168 hours. CBC:  Recent Labs Lab 06/07/16 1431 06/07/16 1848 06/08/16 0500 06/09/16 0500  WBC 0.8* 1.3* 2.2* 5.1  NEUTROABS 0.1* 0.2*  --  1.6*  HGB 11.5* 11.0* 9.7* 10.0*  HCT 34.8* 33.4* 30.6* 31.2*  MCV 87.0 86.5 87.7 87.9  PLT 78* 79* 68* 84*   Cardiac Enzymes: No results for input(s): CKTOTAL, CKMB, CKMBINDEX, TROPONINI in the last 168 hours. BNP: Invalid input(s): POCBNP CBG:  Recent Labs Lab 06/08/16 0802 06/08/16 1144 06/08/16 1702 06/08/16 2146 06/09/16 0743  GLUCAP 130* 189* 228* 174* 150*   D-Dimer No results for input(s): DDIMER in the last 72 hours. Hgb A1c No results for input(s): HGBA1C in the last 72 hours. Lipid Profile No results for input(s): CHOL, HDL, LDLCALC, TRIG, CHOLHDL, LDLDIRECT in the last 72 hours. Thyroid function studies No results for input(s): TSH, T4TOTAL, T3FREE, THYROIDAB in the last 72 hours.  Invalid input(s): FREET3 Anemia work up  Recent Labs  06/08/16 1145  VITAMINB12 1,455*  FOLATE 10.5  FERRITIN 311*  TIBC 277  IRON 20*   Urinalysis    Component Value Date/Time   COLORURINE AMBER (A) 06/07/2016 0203   APPEARANCEUR CLEAR 06/07/2016 0203   LABSPEC 1.014 06/07/2016 0203   PHURINE 6.0 06/07/2016 0203   GLUCOSEU NEGATIVE 06/07/2016 0203   HGBUR SMALL (A) 06/07/2016 0203   BILIRUBINUR NEGATIVE 06/07/2016 0203   KETONESUR NEGATIVE 06/07/2016 0203   PROTEINUR NEGATIVE 06/07/2016 0203   UROBILINOGEN 0.2 12/30/2008 1317   NITRITE NEGATIVE 06/07/2016 0203   LEUKOCYTESUR NEGATIVE 06/07/2016 0203   Sepsis Labs Invalid input(s): PROCALCITONIN,  WBC,  LACTICIDVEN Microbiology Recent Results (from the past 240 hour(s))  C difficile quick scan w PCR reflex      Status: None   Collection Time: 06/08/16  1:05 PM  Result Value Ref Range Status   C Diff antigen NEGATIVE NEGATIVE Final   C Diff toxin NEGATIVE NEGATIVE Final   C Diff interpretation No C. difficile detected.  Final     Time coordinating discharge: 45 minutes  SIGNED:   Tawni Millers, MD  [facility] 06/09/2016, 10:46 AM Pager   If  7PM-7AM, please contact night-coverage [email or web address] Password [email or web address]

## 2016-06-09 NOTE — Progress Notes (Signed)
Pt. discharged to home. Left via wheelchair. No respiratory distress noted.

## 2016-06-09 NOTE — Final Progress Note (Signed)
Discharge teaching completed with teach back. Pt. States she has all her appointment information on her calander. Discharge instructions given and reviewed with pt. No prescriptions given.

## 2016-06-11 ENCOUNTER — Other Ambulatory Visit: Payer: Self-pay | Admitting: Hematology

## 2016-06-11 ENCOUNTER — Telehealth: Payer: Self-pay | Admitting: Hematology

## 2016-06-11 DIAGNOSIS — Z170 Estrogen receptor positive status [ER+]: Principal | ICD-10-CM

## 2016-06-11 DIAGNOSIS — C50412 Malignant neoplasm of upper-outer quadrant of left female breast: Secondary | ICD-10-CM

## 2016-06-11 NOTE — Telephone Encounter (Signed)
lvm to inform pt of 5/17 appt at 1000 per sch msg

## 2016-06-13 NOTE — Progress Notes (Signed)
[place]  [phone] [phone]  Clinic Follow Up Note   Patient Care Team: Orpah Melter, MD as PCP - General (Family Medicine) [name], MD as Consulting Physician (General Surgery) Truitt Merle, MD as Consulting Physician (Hematology) Gery Pray, MD as Consulting Physician (Radiation Oncology) Ladene Artist, MD as Consulting Physician (Gastroenterology) 06/14/2016  CHIEF COMPLAINTS:  Follow up left breast cancer  Oncology History   Cancer Staging Breast cancer of upper-outer quadrant of left female breast [facility]) Staging form: Breast, AJCC 8th Edition - Clinical stage from 04/25/2016: Stage IIA (cT2, cN0, cM0, G3, ER: Negative, PR: Negative, HER2: Positive) - Signed by Truitt Merle, MD on 05/02/2016       Breast cancer of upper-outer quadrant of left female breast ([place])   04/24/2016 Mammogram    Category B breasts with a new irregular mass in the UOQ left breast middle depth. Ultrasound revealed a 3.9 cm mass in the 1:00 position. The left axilla was negative.       04/25/2016 Initial Biopsy    Biopsy of the left breast showed grade 3 invasive ductal carcinoma, DCIS, and lymphovascular invasion was present.      04/25/2016 Receptors her2    ER 0% negative, PR 0% negative, HER2 positive, Ki67 30%      05/02/2016 Initial Diagnosis    Breast cancer of upper-outer quadrant of left female breast ([place])     05/07/2016 Imaging    MRI of the bilateral breast 05/07/16 IMPRESSION: Lobulated enhancing mass (4.0 x 2.7 x 3.1 cm) in the upper-outer quadrant of the left breast corresponding with the recently diagnosed invasive mammary carcinoma. Linear enhancement extends 2.6 cm posterior to the mass worrisome for ductal carcinoma in-situ.      05/09/2016 Echocardiogram    Echo 05/09/16 -LF EF: 55-60%      05/11/2016 -  Neo-Adjuvant Chemotherapy    Docetaxel, Carboplatin, Herceptin and pejeta (TCHP) every 3 weeks, for total of 6 cycles,  followed by Herceptin and perjeta maintenance therapy to complete 1 year treatment.      05/13/2016 Imaging    CT Angio Chest PE IMPRESSION: No evidence of pulmonary emboli. Left breast mass consistent with the given clinical history. Stable left adrenal lesion likely representing a small adenoma.      05/18/2016 - 05/24/2016 Hospital Admission    Patient presented with nausea, vomiting, and diarrhea; admitted to hospital with Hyponatremia      06/07/2016 [facility] Admission    Patient presents to hospital complaints of rectal bleeding and abdominal cramps when defacating      06/07/2016 Imaging    CT ABDOMEN PELVIS W CONTRAST  IMPRESSION: 1. Wall thickening of the descending and sigmoid colon consistent with an infectious or inflammatory colitis. 2. There is also a short segment of narrowing and possible wall thickening of the superior ascending colon which could reflect a constricting mass or, more likely, be an area of persistent colonic spasm. This could be further assessed with either colonoscopy or a barium enema after the current symptoms of colitis have resolved. 3. Small, subcentimeter, low-density liver lesions which may all be benign. However, 3 these are not evident on prior CT. Liver metastatic disease possible. These could be further assessed with liver MRI with and without contrast. 4. Adrenal lesions which are stable, on the right and myelolipoma and on the left most likely an adenoma.      06/07/2016 Imaging    CT A/P IMPRESSION: 1. Wall thickening of the  descending and sigmoid colon consistent with an infectious or inflammatory colitis. 2. There is also a short segment of narrowing and possible wall thickening of the superior ascending colon which could reflect a constricting mass or, more likely, be an area of persistent colonic spasm. This could be further assessed with either colonoscopy or a barium enema after the current symptoms of colitis have  resolved. 3. Small, subcentimeter, low-density liver lesions which may all be benign. However, 3 these are not evident on prior CT. Liver metastatic disease possible. These could be further assessed with liver MRI with and without contrast. 4. Adrenal lesions which are stable, on the right and myelolipoma and on the left most likely an adenoma.      06/07/2016 - 06/09/2016 Hospital Admission    Rectal Bleeding       HISTORY OF PRESENTING ILLNESS (05/02/16):  Brandi Dickson 56 y.o. female is here because of a new diagnosis of left breast cancer. She is accompanied by her husband to our multidisciplinary breast clinic today.  The patient presented with a palpable left breast lump approximately 2 weeks ago. Bilateral diagnostic mammogram on 04/24/16 showed Category B breasts with a new irregular mass in the UOQ left breast middle depth. Ultrasound performed on 04/24/16 revealed a 3.9 cm mass in the 1:00 position. The left axilla was negative.  Biopsy of the left breast on 04/25/16 showed grade 3 invasive ductal carcinoma, DCIS, and lymphovascular invasion was present (ER 0% negative, PR 0% negative, HER2 positive, Ki67 30%).  She denies tenderness of the biopsied area. Reports minor dimpling. The patient is 8 weeks out from a left hip replacement. She had a right hip replacement 2010 years ago.  The patient and her husband present today in multidisciplinary breast clinic to discuss treatment options for the management of her disease.  The patient is taking Neurontin for neuropathy in her feet from diabetes. She has been diagnosed for diabetes for the past 4 years. She states she only has neuropathy at night and take 2 Neurontin at night and then has no symptoms.  GYN HISTORY  Menarchal: 11 LMP: Complete hysterectomy for uterine fibroids ~ 2009. She was bleeding too much with her last menstrual cycle lasting 6 weeks. No issues with menopause. Contraceptive: no  HRT: No GP: G1P0  CURRENT  THERAPY: Docetaxel, Carboplatin, Herceptin and pejeta (TCHP) every 3 weeks, for total of 6 cycles, followed by Herceptin and perjeta maintenance therapy to complete 1 year treatment starting 05/11/16; Docetaxel changed to Taxol, carbo dose reduced to AUC 4.5 (from 5)  from cycle 2 due to severe diarrhea and cytopenia, on 06/01/16  INTERVAL HISTORY: Brandi Dickson returns for follow up.  The patient was hospitalized from 5/10 - 5/12 for diarrhea and rectal bleeding.  Recent CT AP on 06/07/16 showed subcentimeter low-density liver lesions which may be benign.   The patient reports she is doing much better today. Her diarrhea is greatly improved. She reports she had been eating lots of sugary popsicles, and stopped this, which she believes led to her diarrhea mostly subsiding. She denies bowel urgency now. The patient is still taking Prednisone 40 mg, and reports she will begin taper soon.  She mentions she would like to visit her mother in [facility], but has been concerned about seeing her mother while the patient's health was so poor.  MEDICAL HISTORY:  Past Medical History:  Diagnosis Date  . Arthritis   . Asthma    triggered with [name]  perfumes and cigarette smoke  . Cancer ([place])   . Colitis   . Diabetes mellitus without complication ([place])   . Hypertension   . Neuropathy     SURGICAL HISTORY: Past Surgical History:  Procedure Laterality Date  . ABDOMINAL HYSTERECTOMY    . DILATION AND CURETTAGE OF UTERUS    . PORTACATH PLACEMENT Right 05/08/2016   Procedure: INSERTION PORT-A-CATH WITH [place];  Surgeon: [name], MD;  Location: [facility];  Service: General;  Laterality: Right;  . TONSILLECTOMY    . TOTAL HIP ARTHROPLASTY Right   . TOTAL HIP ARTHROPLASTY Left 03/06/2016   Procedure: LEFT TOTAL HIP ARTHROPLASTY ANTERIOR APPROACH;  Surgeon: [name], MD;  Location: [facility];  Service: Orthopedics;  Laterality: Left;    SOCIAL  HISTORY: Social History   Social History  . Marital status: Married    Spouse name: N/A  . Number of children: N/A  . Years of education: N/A   Occupational History  . Not on file.   Social History Main Topics  . Smoking status: Former Smoker    Packs/day: 1.00    Years: 29.00    Types: Cigarettes    Quit date: 10/27/2002  . Smokeless tobacco: Never Used  . Alcohol use No  . Drug use: No  . Sexual activity: Yes    Birth control/ protection: Surgical   Other Topics Concern  . Not on file   Social History Narrative  . No narrative on file    FAMILY HISTORY: Family History  Problem Relation Age of Onset  . Colon cancer Father   . Stomach cancer Paternal Uncle   . Stomach cancer Paternal Uncle   . Melanoma Brother   . Thyroid cancer Brother   . Breast cancer Maternal Aunt   . Breast cancer Maternal Aunt   . Breast cancer Cousin     ALLERGIES:  is allergic to lisinopril; augmentin [amoxicillin-pot clavulanate]; and benadryl [diphenhydramine].  MEDICATIONS:  Current Outpatient Prescriptions  Medication Sig Dispense Refill  . albuterol (PROVENTIL HFA;VENTOLIN HFA) 108 (90 Base) MCG/ACT inhaler Inhale 1-2 puffs into the lungs every 6 (six) hours as needed for wheezing or shortness of breath.    . dicyclomine (BENTYL) 20 MG tablet Take 1 tablet (20 mg total) by mouth 4 (four) times daily -  before meals and at bedtime. (Patient taking differently: Take 20 mg by mouth 4 (four) times daily as needed. ) 15 tablet 0  . ferrous sulfate (FERROUSUL) 325 (65 FE) MG tablet Take 1 tablet (325 mg total) by mouth 3 (three) times daily with meals.    . gabapentin (NEURONTIN) 100 MG capsule Take 200 mg by mouth at bedtime.     . hydrocortisone (ANUSOL-HC) 25 MG suppository Place 1 suppository (25 mg total) rectally 2 (two) times daily. 12 suppository 0  . lidocaine-prilocaine (EMLA) cream Apply 1 application to skin 1.5 to 2 hrs before use.  Cover to secure cream with plastic wrap. 30 g  PRN  . loperamide (IMODIUM) 1 MG/5ML solution Take 3 mg by mouth as needed for diarrhea or loose stools.    [name] Kitchen LORazepam (ATIVAN) 0.5 MG tablet Take 1 tablet (0.5 mg total) by mouth once as needed for anxiety. 2 tablet 0  . Mesalamine (ASACOL HD) 800 MG TBEC Take 2 tablets (1,600 mg total) by mouth 3 (three) times daily. 90 tablet 5  . metFORMIN (GLUCOPHAGE-XR) 500 MG 24 hr tablet Take 1,000 mg by mouth at bedtime.    . metoprolol (  LOPRESSOR) 50 MG tablet Take 50 mg by mouth at bedtime.     . niacin 250 MG tablet Take 250 mg by mouth at bedtime.     . ondansetron (ZOFRAN) 8 MG tablet Take 1 tablet (8 mg total) by mouth 2 (two) times daily as needed for refractory nausea / vomiting. Start on day 3 after chemo. 30 tablet 1  . predniSONE (DELTASONE) 20 MG tablet Take 40 mg by mouth daily.    . prochlorperazine (COMPAZINE) 10 MG tablet Take 1 tablet (10 mg total) by mouth every 6 (six) hours as needed (Nausea or vomiting). 30 tablet 1  . Specialty Vitamins Products (VITAMINS FOR THE HAIR) TABS Take 2 tablets by mouth 2 (two) times daily.    . traMADol (ULTRAM) 50 MG tablet Take 1-2 tablets (50-100 mg total) by mouth every 6 (six) hours as needed. 40 tablet 0  . triamterene-hydrochlorothiazide (MAXZIDE) 75-50 MG per tablet Take 0.5 tablets by mouth daily.     [name] Kitchen dexamethasone (DECADRON) 4 MG tablet Take 1 tablet (4 mg total) by mouth daily. Start the day before Taxotere. Then again the day after chemo for 3 days. (Patient not taking: Reported on 06/14/2016) 30 tablet 1  . diphenoxylate-atropine (LOMOTIL) 2.5-0.025 MG tablet Take one to two tablets every six hours as needed for diarrhea 120 tablet 1   No current facility-administered medications for this visit.    Facility-Administered Medications Ordered in Other Visits  Medication Dose Route Frequency Provider Last Rate Last Dose  . heparin lock flush 100 unit/mL  500 Units Intracatheter Once PRN Truitt Merle, MD      . sodium chloride flush (NS) 0.9 %  injection 10 mL  10 mL Intracatheter PRN Truitt Merle, MD        REVIEW OF SYSTEMS:   Constitutional: Denies fevers, chills or abnormal night sweats Eyes: Denies blurriness of vision, double vision or watery eyes Ears, nose, mouth, throat, and face: Denies mucositis or sore throat Respiratory: Denies cough, dyspnea or wheezes Cardiovascular: Denies palpitation, chest discomfort or lower extremity swelling Gastrointestinal:  Denies nausea, heartburn (+) diarrhea 3-5 times daily, (+) flatus Skin: Denies abnormal skin rashes Lymphatics: Denies new lymphadenopathy or easy bruising Neurological: (+) Foot numbness from diabetic neuropathy. (+) Right chest pain/discomfort from port a cath placement. Behavioral/Psych: Mood is stable, no new changes  All other systems were reviewed with the patient and are negative.  PHYSICAL EXAMINATION: ECOG PERFORMANCE STATUS: 1 - Symptomatic but completely ambulatory  Vitals:   06/14/16 1020  BP: 125/75  Pulse: 81  Resp: 18  Temp: 98.6 F (37 C)   Filed Weights   06/14/16 1020  Weight: 196 lb 1.6 oz (89 kg)    GENERAL:alert, no distress and comfortable SKIN: skin color, texture, turgor are normal, no rashes or significant lesions EYES: normal, conjunctiva are pink and non-injected, sclera clear OROPHARYNX:no exudate, no erythema and lips, buccal mucosa, and tongue normal  NECK: supple, thyroid normal size, non-tender, without nodularity LYMPH:  no palpable lymphadenopathy in the cervical, axillary or inguinal LUNGS: clear to auscultation and percussion with normal breathing effort HEART: regular rate & rhythm and no murmurs and no lower extremity edema ABDOMEN:abdomen soft, non-tender and normal bowel sounds Musculoskeletal:no cyanosis of digits and no clubbing  PSYCH: alert & oriented x 3 with fluent speech NEURO: no focal motor/sensory deficits BREAST: Breast inspection showed them to be symmetrical with no nipple discharge. Left breast exam  showed UOQ 2 x 2.5 cm palpable mass, non tender,  and smaller in size than previous exam. No palpable mass in the right breast and bilateral axilla.  LABORATORY DATA:  I have reviewed the data as listed CBC Latest Ref Rng & Units 06/14/2016 06/09/2016 06/08/2016  WBC 3.9 - 10.3 10e3/uL 14.3(H) 5.1 2.2(L)  Hemoglobin 11.6 - 15.9 g/dL 12.0 10.0(L) 9.7(L)  Hematocrit 34.8 - 46.6 % 37.1 31.2(L) 30.6(L)  Platelets 145 - 400 10e3/uL 209 84(L) 68(L)   CMP Latest Ref Rng & Units 06/14/2016 06/09/2016 06/08/2016  Glucose 70 - 140 mg/dl 142(H) 112(H) 113(H)  BUN 7.0 - 26.0 mg/dL 20._0 Creatinine 0.6 - 1.1 mg/dL 1.0 0.63 0.65  Sodium 136 - 145 mEq/L 138 138 137  Potassium 3.5 - 5.1 mEq/L 3.4(L) 3.4(L) 2.8(L)  Chloride 101 - 111 mmol/L - 102 102  CO2 22 - 29 mEq/L _1 Calcium 8.4 - 10.4 mg/dL 9.3 8.6(L) 8.2(L)  Total Protein 6.4 - 8.3 g/dL 6.6 - -  Total Bilirubin 0.20 - 1.20 mg/dL 0.25 - -  Alkaline Phos 40 - 150 U/L 113 - -  AST 5 - 34 U/L 14 - -  ALT 0 - 55 U/L 24 - -   PATHOLOGY REPORT:   ADDITIONAL INFORMATION: 04/25/16 PROGNOSTIC INDICATORS Results: IMMUNOHISTOCHEMICAL AND MORPHOMETRIC ANALYSIS PERFORMED MANUALLY Estrogen Receptor: 0%, NEGATIVE Progesterone Receptor: 0%, NEGATIVE Proliferation Marker Ki67: 30% COMMENT: The negative hormone receptor study(ies) in this case has no internal positive control. REFERENCE RANGE ESTROGEN RECEPTOR NEGATIVE 0% POSITIVE =>1% REFERENCE RANGE PROGESTERONE RECEPTOR NEGATIVE 0% POSITIVE =>1% All controls stained appropriately Enid Cutter MD Pathologist, Electronic Signature ( Signed 05/01/2016) FLUORESCENCE IN-SITU HYBRIDIZATION Results: HER2 - **POSITIVE** RATIO OF HER2/CEP17 SIGNALS 2.25 AVERAGE HER2 COPY NUMBER PER CELL 8.45 1 of 3 FINAL for Bouska, Hailea P ([id number]) ADDITIONAL INFORMATION:(continued) Reference Range: NEGATIVE HER2/CEP17 Ratio <2.0 and average HER2 copy number <4.0 EQUIVOCAL HER2/CEP17 Ratio <2.0 and  average HER2 copy number 4.0 and <6.0 POSITIVE HER2/CEP17 Ratio >=2.0 or <2.0 and average HER2 copy number >=6.0 Enid Cutter MD Pathologist, Electronic Signature ( Signed 04/30/2016) FINAL DIAGNOSIS Diagnosis Breast, left, needle core biopsy - INVASIVE DUCTAL CARCINOMA, SEE COMMENT. - DUCTAL CARCINOMA IN SITU. - LYMPHOVASCULAR INVASION PRESENT. Microscopic Comment The carcinoma appears grade 3 with focal squamous differentiation. Prognostic markers will be ordered. Dr. Lyndon Code has reviewed the case. The case was called to Dr. Isaiah Blakes on 04/26/2016. Vicente Males MD Pathologist, Electronic Signature (Case signed 04/26/2016)  RADIOGRAPHIC STUDIES: I have personally reviewed the radiological images as listed and agreed with the findings in the report.  See onc history  CT A/P 06/07/16 IMPRESSION: 1. Wall thickening of the descending and sigmoid colon consistent with an infectious or inflammatory colitis. 2. There is also a short segment of narrowing and possible wall thickening of the superior ascending colon which could reflect a constricting mass or, more likely, be an area of persistent colonic spasm. This could be further assessed with either colonoscopy or a barium enema after the current symptoms of colitis have resolved. 3. Small, subcentimeter, low-density liver lesions which may all be benign. However, 3 these are not evident on prior CT. Liver metastatic disease possible. These could be further assessed with liver MRI with and without contrast. 4. Adrenal lesions which are stable, on the right and myelolipoma and on the left most likely an adenoma.  Echo 05/09/16 Study Conclusions -LF EF: 55-60% - Left ventricle: The cavity size was normal. There was mild focal   basal hypertrophy of the septum. Indeterminant diastolic  function. Systolic function was normal. The estimated ejection   fraction was in the range of 55% to 60%. Wall motion was normal;   there were no  regional wall motion abnormalities. GLS abnormal at   -13.3%, poor images however.  ASSESSMENT & PLAN: 56 y.o. post-menopausal Caucasian female with a self palpated left breast mass.  1. Breast cancer of upper-outer quadrant of left breast, invasive ductal carcinoma,  stage IIA (cT2N0M0) grade 3, ER-, PR-, HER2 amplified -We previously reviewed the patient's imaging and pathology. -We previously reviewed her staging and biology of her breast cancer  -We previously discussed that surgical resection is the definitive treatment for breast cancer, she was seen by breast surgeon Dr. Lucia Gaskins today, lumpectomy versus mastectomy were discussed with patient. -We previously discussed HER2 positive breast cancers total approximately 15% of breast cancers and happens to be more aggressive than HER2 negative cancers, especially ER and PR negative disease, she has high likelihood of cancer recurrence after complete surgical resection.  -I recommend neoadjuvant chemotherapy TCHP (docetaxel, carboplatin, Herceptin and pejeta) every 3 weeks for 6 cycles, followed by maintenance Herceptin and pejeta to complete 1 year therapy -Her baseline echo was normal, she was seen by cardiologist Dr. Benjamine Mola -She unfortunately developed severe diarrhea and cytopenia after first cycle chemotherapy, was hospitalized for a week. She then recovered well, with some moderate diarrhea. -I previously recommend change docetaxel to paclitaxel, which causes less diarrhea. I'll also reduce her carboplatin from AUC 5 to 4.5, due to cytopenia. She will receive Neulasta on day 3. I'll slightly decreased pejeta dose by 10% for cycle 2. If she tolerates cycle 2 well, pejeta will be back to full dose for cycle 3. - The patient was recently hospitalized again following cycle 2 for severe diarrhea, but recovered quickly.  - Labs reviewed, recovered from hospitalization. Adequate for treatment with CarboTaxol and Herceptin next week on 06/21/16. Will  hold Perjeta for cycle 3 due to her recent diarrhea. -We again discussed the management of diarrhea. Patient may take up to 8 tablets of Imodium and Lomotil alternatively in order to control diarrhea. -She had a CT abdomen done during her hospitalization, which showed small indeterminate liver lesions, likely benign, I'll obtain a abdominal MRI with and without contrast to further evaluate, and rule out metastasis.  2. Genetics -Given her strong family history of breast cancer, we recommend her to see genetic counseling to ruled out inheritable breast cancer syndrome. She agreed. -Genetic counseling scheduled 05/17/16, but she did not make it due to her hospitalization. We'll reschedule.  3. Type 2 Diabetes mellitus, HTN -Managed by her PCP. -The patient has peripheral neuropathy in her feet from her diabetes. The patient is already on Neurontin with 200 mg at night. We discussed that chemotherapy may make her neuropathy worse. -Steroids will be given to reduce chemo side effects and I will reduce dexa to 69m daily to not affect her blood sugar much. -We'll monitor her blood glucose and blood pressure closely during her chemotherapy treatment.  4. Ulcerative colitis -We previously discussed that chemotherapy would cause diarrhea and the patient's colitis may exacerbate during chemo  -She is not taking steroids for this. She is on mesalamine, will follow up with Dr. SFuller Plan -She had severe diarrhea after first cycle chemotherapy, was treated for ulcerative colitis flare, she will follow-up with Dr. SFuller Plannext week. -I previously advised the patient to keep herself adequatly hydrated and to take Imodium PRN. - I again advised the patient to take  Lomotil and Imodium frequently as needed. - The patient is still taking Prednisone 40 mg, and reports she will begin taper soon.  5. Arthritis  -s/p b/l hip replacement, last surgery in February 2018.   PLAN - Labs reviewed today, adequate for treatment  on 06/21/16. Will hold Perjeta for cycle 3 due to her recent diarrhea. Continue Herceptin and CarboTaxol. -I will see her again next week before chemo  - I will update the patient's Lomotil prescription for 120 tablets. Patient will take printed rx to pharmacy. - Order MRI to further evaluate potential liver lesions seen on recent CT scan. - Follow up in 1 week for chemo, labs, flush, and follow up.  Orders Placed This Encounter  Procedures  . MR Abdomen W Wo Contrast    Standing Status:   Future    Standing Expiration Date:   08/14/2017    Order Specific Question:   If indicated for the ordered procedure, I authorize the administration of contrast media per Radiology protocol    Answer:   Yes    Order Specific Question:   Reason for Exam (SYMPTOM  OR DIAGNOSIS REQUIRED)    Answer:   evaluate liver lesions on CT scan    Order Specific Question:   What is the patient's sedation requirement?    Answer:   No Sedation    Order Specific Question:   Does the patient have a pacemaker or implanted devices?    Answer:   No    Order Specific Question:   Preferred imaging location?    Answer:   [facility] (table limit-350 lbs)    Order Specific Question:   Radiology Contrast Protocol - do NOT remove file path    Answer:   [email or web address]    All questions were answered. The patient knows to call the clinic with any problems, questions or concerns.  I spent 20 minutes counseling the patient face to face. The total time spent in the appointment was 25 minutes and more than 50% was on counseling.  This document serves as a record of services personally performed by Truitt Merle, MD. It was created on her behalf by Maryla Morrow, a trained medical scribe. The creation of this record is based on the scribe's personal observations and the provider's statements to them. This document has been checked and approved by the attending provider.   Truitt Merle, MD 06/14/2016

## 2016-06-14 ENCOUNTER — Other Ambulatory Visit (HOSPITAL_BASED_OUTPATIENT_CLINIC_OR_DEPARTMENT_OTHER): Payer: 59

## 2016-06-14 ENCOUNTER — Encounter: Payer: Self-pay | Admitting: Hematology

## 2016-06-14 ENCOUNTER — Ambulatory Visit (HOSPITAL_BASED_OUTPATIENT_CLINIC_OR_DEPARTMENT_OTHER): Payer: 59 | Admitting: Hematology

## 2016-06-14 VITALS — BP 125/75 | HR 81 | Temp 98.6°F | Resp 18 | Ht 64.75 in | Wt 196.1 lb

## 2016-06-14 DIAGNOSIS — Z171 Estrogen receptor negative status [ER-]: Principal | ICD-10-CM

## 2016-06-14 DIAGNOSIS — E119 Type 2 diabetes mellitus without complications: Secondary | ICD-10-CM

## 2016-06-14 DIAGNOSIS — C50412 Malignant neoplasm of upper-outer quadrant of left female breast: Secondary | ICD-10-CM | POA: Diagnosis not present

## 2016-06-14 DIAGNOSIS — Z170 Estrogen receptor positive status [ER+]: Secondary | ICD-10-CM

## 2016-06-14 DIAGNOSIS — K51311 Ulcerative (chronic) rectosigmoiditis with rectal bleeding: Secondary | ICD-10-CM

## 2016-06-14 DIAGNOSIS — I10 Essential (primary) hypertension: Secondary | ICD-10-CM | POA: Diagnosis not present

## 2016-06-14 LAB — CBC WITH DIFFERENTIAL/PLATELET
BASO%: 0.2 % (ref 0.0–2.0)
Basophils Absolute: 0 10*3/uL (ref 0.0–0.1)
EOS%: 0 % (ref 0.0–7.0)
Eosinophils Absolute: 0 10*3/uL (ref 0.0–0.5)
HEMATOCRIT: 37.1 % (ref 34.8–46.6)
HEMOGLOBIN: 12 g/dL (ref 11.6–15.9)
LYMPH#: 1.7 10*3/uL (ref 0.9–3.3)
LYMPH%: 11.8 % — ABNORMAL LOW (ref 14.0–49.7)
MCH: 29.1 pg (ref 25.1–34.0)
MCHC: 32.4 g/dL (ref 31.5–36.0)
MCV: 89.9 fL (ref 79.5–101.0)
MONO#: 0.3 10*3/uL (ref 0.1–0.9)
MONO%: 1.9 % (ref 0.0–14.0)
NEUT%: 86.1 % — ABNORMAL HIGH (ref 38.4–76.8)
NEUTROS ABS: 12.3 10*3/uL — AB (ref 1.5–6.5)
PLATELETS: 209 10*3/uL (ref 145–400)
RBC: 4.13 10*6/uL (ref 3.70–5.45)
RDW: 15.5 % — ABNORMAL HIGH (ref 11.2–14.5)
WBC: 14.3 10*3/uL — AB (ref 3.9–10.3)

## 2016-06-14 LAB — COMPREHENSIVE METABOLIC PANEL
ALT: 24 U/L (ref 0–55)
ANION GAP: 11 meq/L (ref 3–11)
AST: 14 U/L (ref 5–34)
Albumin: 3.3 g/dL — ABNORMAL LOW (ref 3.5–5.0)
Alkaline Phosphatase: 113 U/L (ref 40–150)
BUN: 20.6 mg/dL (ref 7.0–26.0)
CALCIUM: 9.3 mg/dL (ref 8.4–10.4)
CO2: 25 meq/L (ref 22–29)
Chloride: 103 mEq/L (ref 98–109)
Creatinine: 1 mg/dL (ref 0.6–1.1)
EGFR: 63 mL/min/{1.73_m2} — ABNORMAL LOW (ref >90)
Glucose: 142 mg/dl — ABNORMAL HIGH (ref 70–140)
Potassium: 3.4 mEq/L — ABNORMAL LOW (ref 3.5–5.1)
Sodium: 138 mEq/L (ref 136–145)
TOTAL PROTEIN: 6.6 g/dL (ref 6.4–8.3)
Total Bilirubin: 0.25 mg/dL (ref 0.20–1.20)

## 2016-06-14 MED ORDER — DIPHENOXYLATE-ATROPINE 2.5-0.025 MG PO TABS: ORAL_TABLET | ORAL | 1 refills | Status: DC

## 2016-06-20 ENCOUNTER — Telehealth: Payer: Self-pay | Admitting: Hematology

## 2016-06-20 NOTE — Progress Notes (Signed)
[place]  [phone] [phone]  Clinic Follow Up Note   Patient Care Team: Orpah Melter, MD as PCP - General (Family Medicine) [name], MD as Consulting Physician (General Surgery) Truitt Merle, MD as Consulting Physician (Hematology) Gery Pray, MD as Consulting Physician (Radiation Oncology) Ladene Artist, MD as Consulting Physician (Gastroenterology) 06/21/2016  CHIEF COMPLAINTS:  Follow up left breast cancer  Oncology History   Cancer Staging Breast cancer of upper-outer quadrant of left female breast [facility]) Staging form: Breast, AJCC 8th Edition - Clinical stage from 04/25/2016: Stage IIA (cT2, cN0, cM0, G3, ER: Negative, PR: Negative, HER2: Positive) - Signed by Truitt Merle, MD on 05/02/2016       Breast cancer of upper-outer quadrant of left female breast ([place])   04/24/2016 Mammogram    Category B breasts with a new irregular mass in the UOQ left breast middle depth. Ultrasound revealed a 3.9 cm mass in the 1:00 position. The left axilla was negative.       04/25/2016 Initial Biopsy    Biopsy of the left breast showed grade 3 invasive ductal carcinoma, DCIS, and lymphovascular invasion was present.      04/25/2016 Receptors her2    ER 0% negative, PR 0% negative, HER2 positive, Ki67 30%      05/02/2016 Initial Diagnosis    Breast cancer of upper-outer quadrant of left female breast ([place])     05/07/2016 Imaging    MRI of the bilateral breast 05/07/16 IMPRESSION: Lobulated enhancing mass (4.0 x 2.7 x 3.1 cm) in the upper-outer quadrant of the left breast corresponding with the recently diagnosed invasive mammary carcinoma. Linear enhancement extends 2.6 cm posterior to the mass worrisome for ductal carcinoma in-situ.      05/09/2016 Echocardiogram    Echo 05/09/16 -LF EF: 55-60%      05/11/2016 -  Neo-Adjuvant Chemotherapy    Docetaxel, Carboplatin, Herceptin and pejeta (TCHP) every 3 weeks, for total of 6 cycles,  followed by Herceptin and perjeta maintenance therapy to complete 1 year treatment   starting 05/11/16; Docetaxel changed to Taxol, carbo dose reduced to AUC 4.5 (from 5)  from cycle 2 due to severe diarrhea and cytopenia, on 06/01/16. Will hold Perjeta and add herceptin for cycle 3       05/13/2016 Imaging    CT Angio Chest PE IMPRESSION: No evidence of pulmonary emboli. Left breast mass consistent with the given clinical history. Stable left adrenal lesion likely representing a small adenoma.      05/18/2016 - 05/24/2016 Hospital Admission    Patient presented with nausea, vomiting, and diarrhea; admitted to hospital with Hyponatremia      06/07/2016 - 06/09/2016 Hospital Admission    Patient presents to hospital complaints of rectal bleeding and abdominal cramps when defacating      06/07/2016 Imaging    CT ABDOMEN PELVIS W CONTRAST  IMPRESSION: 1. Wall thickening of the descending and sigmoid colon consistent with an infectious or inflammatory colitis. 2. There is also a short segment of narrowing and possible wall thickening of the superior ascending colon which could reflect a constricting mass or, more likely, be an area of persistent colonic spasm. This could be further assessed with either colonoscopy or a barium enema after the current symptoms of colitis have resolved. 3. Small, subcentimeter, low-density liver lesions which may all be benign. However, 3 these are not evident on prior CT. Liver metastatic disease possible. These could be further assessed with liver MRI with and without  contrast. 4. Adrenal lesions which are stable, on the right and myelolipoma and on the left most likely an adenoma.      06/07/2016 Imaging    CT A/P IMPRESSION: 1. Wall thickening of the descending and sigmoid colon consistent with an infectious or inflammatory colitis. 2. There is also a short segment of narrowing and possible wall thickening of the superior ascending colon which could  reflect a constricting mass or, more likely, be an area of persistent colonic spasm. This could be further assessed with either colonoscopy or a barium enema after the current symptoms of colitis have resolved. 3. Small, subcentimeter, low-density liver lesions which may all be benign. However, 3 these are not evident on prior CT. Liver metastatic disease possible. These could be further assessed with liver MRI with and without contrast. 4. Adrenal lesions which are stable, on the right and myelolipoma and on the left most likely an adenoma.      06/07/2016 - 06/09/2016 Hospital Admission    Rectal Bleeding       HISTORY OF PRESENTING ILLNESS (05/02/16):  Brandi Dickson 56 y.o. female is here because of a new diagnosis of left breast cancer. She is accompanied by her husband to our multidisciplinary breast clinic today.  The patient presented with a palpable left breast lump approximately 2 weeks ago. Bilateral diagnostic mammogram on 04/24/16 showed Category B breasts with a new irregular mass in the UOQ left breast middle depth. Ultrasound performed on 04/24/16 revealed a 3.9 cm mass in the 1:00 position. The left axilla was negative.  Biopsy of the left breast on 04/25/16 showed grade 3 invasive ductal carcinoma, DCIS, and lymphovascular invasion was present (ER 0% negative, PR 0% negative, HER2 positive, Ki67 30%).  She denies tenderness of the biopsied area. Reports minor dimpling. The patient is 8 weeks out from a left hip replacement. She had a right hip replacement 2010 years ago.  The patient and her husband present today in multidisciplinary breast clinic to discuss treatment options for the management of her disease.  The patient is taking Neurontin for neuropathy in her feet from diabetes. She has been diagnosed for diabetes for the past 4 years. She states she only has neuropathy at night and take 2 Neurontin at night and then has no symptoms.  GYN HISTORY  Menarchal:  9 LMP: Complete hysterectomy for uterine fibroids ~ 2009. She was bleeding too much with her last menstrual cycle lasting 6 weeks. No issues with menopause. Contraceptive: no  HRT: No GP: G1P0  CURRENT THERAPY: Docetaxel, Carboplatin, Herceptin and pejeta (TCHP) every 3 weeks, for total of 6 cycles, followed by Herceptin and perjeta maintenance therapy to complete 1 year treatment starting 05/11/16; Docetaxel changed to Taxol, carbo dose reduced to AUC 4.5 (from 5)  from cycle 2 due to severe diarrhea and cytopenia, on 06/01/16. Will hold Perjeta for cycle 3  INTERVAL HISTORY: Brandi Dickson returns for follow up. She presents to the clinic today with her husband. She reports having some issues with BM but things are settling down. She uses imodium as needed. She denies stomach cramps. She had one day of bleeding with her BM when she went to the hospital. She still feels the lump same as last time.     MEDICAL HISTORY:  Past Medical History:  Diagnosis Date  . Arthritis   . Asthma    triggered with [name] perfumes and cigarette smoke  . Cancer ([place])   . Colitis   .  Diabetes mellitus without complication ([place])   . Hypertension   . Neuropathy     SURGICAL HISTORY: Past Surgical History:  Procedure Laterality Date  . ABDOMINAL HYSTERECTOMY    . DILATION AND CURETTAGE OF UTERUS    . PORTACATH PLACEMENT Right 05/08/2016   Procedure: INSERTION PORT-A-CATH WITH [place];  Surgeon: [name], MD;  Location: [facility];  Service: General;  Laterality: Right;  . TONSILLECTOMY    . TOTAL HIP ARTHROPLASTY Right   . TOTAL HIP ARTHROPLASTY Left 03/06/2016   Procedure: LEFT TOTAL HIP ARTHROPLASTY ANTERIOR APPROACH;  Surgeon: [name], MD;  Location: [facility];  Service: Orthopedics;  Laterality: Left;    SOCIAL HISTORY: Social History   Social History  . Marital status: Married    Spouse name: N/A  . Number of children: N/A  . Years of education: N/A   Occupational  History  . Not on file.   Social History Main Topics  . Smoking status: Former Smoker    Packs/day: 1.00    Years: 29.00    Types: Cigarettes    Quit date: 10/27/2002  . Smokeless tobacco: Never Used  . Alcohol use No  . Drug use: No  . Sexual activity: Yes    Birth control/ protection: Surgical   Other Topics Concern  . Not on file   Social History Narrative  . No narrative on file    FAMILY HISTORY: Family History  Problem Relation Age of Onset  . Colon cancer Father   . Stomach cancer Paternal Uncle   . Stomach cancer Paternal Uncle   . Melanoma Brother   . Thyroid cancer Brother   . Breast cancer Maternal Aunt   . Breast cancer Maternal Aunt   . Breast cancer Cousin     ALLERGIES:  is allergic to lisinopril; augmentin [amoxicillin-pot clavulanate]; and benadryl [diphenhydramine].  MEDICATIONS:  Current Outpatient Prescriptions  Medication Sig Dispense Refill  . albuterol (PROVENTIL HFA;VENTOLIN HFA) 108 (90 Base) MCG/ACT inhaler Inhale 1-2 puffs into the lungs every 6 (six) hours as needed for wheezing or shortness of breath.    . dexamethasone (DECADRON) 4 MG tablet Take 1 tablet (4 mg total) by mouth daily. Start the day before Taxotere. Then again the day after chemo for 3 days. 30 tablet 1  . dicyclomine (BENTYL) 20 MG tablet Take 1 tablet (20 mg total) by mouth 4 (four) times daily -  before meals and at bedtime. (Patient taking differently: Take 20 mg by mouth 4 (four) times daily as needed. ) 15 tablet 0  . diphenoxylate-atropine (LOMOTIL) 2.5-0.025 MG tablet Take one to two tablets every six hours as needed for diarrhea 120 tablet 1  . ferrous sulfate (FERROUSUL) 325 (65 FE) MG tablet Take 1 tablet (325 mg total) by mouth 3 (three) times daily with meals.    . gabapentin (NEURONTIN) 100 MG capsule Take 200 mg by mouth at bedtime.     . hydrocortisone (ANUSOL-HC) 25 MG suppository Place 1 suppository (25 mg total) rectally 2 (two) times daily. 12 suppository 0    . lidocaine-prilocaine (EMLA) cream Apply 1 application to skin 1.5 to 2 hrs before use.  Cover to secure cream with plastic wrap. 30 g PRN  . loperamide (IMODIUM) 1 MG/5ML solution Take 3 mg by mouth as needed for diarrhea or loose stools.    [name] Kitchen LORazepam (ATIVAN) 0.5 MG tablet Take 1 tablet (0.5 mg total) by mouth once as needed for anxiety. 2 tablet 0  . Mesalamine (  ASACOL HD) 800 MG TBEC Take 2 tablets (1,600 mg total) by mouth 3 (three) times daily. 90 tablet 5  . metFORMIN (GLUCOPHAGE-XR) 500 MG 24 hr tablet Take 1,000 mg by mouth at bedtime.    . metoprolol (LOPRESSOR) 50 MG tablet Take 50 mg by mouth at bedtime.     . niacin 250 MG tablet Take 250 mg by mouth at bedtime.     . ondansetron (ZOFRAN) 8 MG tablet Take 1 tablet (8 mg total) by mouth 2 (two) times daily as needed for refractory nausea / vomiting. Start on day 3 after chemo. 30 tablet 1  . predniSONE (DELTASONE) 20 MG tablet Take 40 mg by mouth daily.    . prochlorperazine (COMPAZINE) 10 MG tablet Take 1 tablet (10 mg total) by mouth every 6 (six) hours as needed (Nausea or vomiting). 30 tablet 1  . Specialty Vitamins Products (VITAMINS FOR THE HAIR) TABS Take 2 tablets by mouth 2 (two) times daily.    . traMADol (ULTRAM) 50 MG tablet Take 1-2 tablets (50-100 mg total) by mouth every 6 (six) hours as needed. 40 tablet 0  . triamterene-hydrochlorothiazide (MAXZIDE) 75-50 MG per tablet Take 0.5 tablets by mouth daily.      No current facility-administered medications for this visit.    Facility-Administered Medications Ordered in Other Visits  Medication Dose Route Frequency Provider Last Rate Last Dose  . heparin lock flush 100 unit/mL  500 Units Intracatheter Once PRN Truitt Merle, MD      . sodium chloride flush (NS) 0.9 % injection 10 mL  10 mL Intracatheter PRN Truitt Merle, MD        REVIEW OF SYSTEMS:   Constitutional: Denies fevers, chills or abnormal night sweats Eyes: Denies blurriness of vision, double vision or watery  eyes Ears, nose, mouth, throat, and face: Denies mucositis or sore throat Respiratory: Denies cough, dyspnea or wheezes Cardiovascular: Denies palpitation, chest discomfort or lower extremity swelling Gastrointestinal:  Denies nausea, heartburn (+) diarrhea improved, (+) flatus Skin: Denies abnormal skin rashes Lymphatics: Denies new lymphadenopathy or easy bruising Neurological: (+) Foot numbness from diabetic neuropathy. (+) Right chest pain/discomfort from port a cath placement. Behavioral/Psych: Mood is stable, no new changes  All other systems were reviewed with the patient and are negative.  PHYSICAL EXAMINATION:  ECOG PERFORMANCE STATUS: 1 - Symptomatic but completely ambulatory  Vitals:   06/21/16 1000  BP: 116/67  Pulse: 63  Resp: 20  Temp: 98.3 F (36.8 C)   Filed Weights   06/21/16 1000  Weight: 200 lb (90.7 kg)    GENERAL:alert, no distress and comfortable SKIN: skin color, texture, turgor are normal, no rashes or significant lesions EYES: normal, conjunctiva are pink and non-injected, sclera clear OROPHARYNX:no exudate, no erythema and lips, buccal mucosa, and tongue normal  NECK: supple, thyroid normal size, non-tender, without nodularity LYMPH:  no palpable lymphadenopathy in the cervical, axillary or inguinal LUNGS: clear to auscultation and percussion with normal breathing effort HEART: regular rate & rhythm and no murmurs and no lower extremity edema ABDOMEN:abdomen soft, non-tender and normal bowel sounds Musculoskeletal:no cyanosis of digits and no clubbing  PSYCH: alert & oriented x 3 with fluent speech NEURO: no focal motor/sensory deficits BREAST: Breast inspection showed them to be symmetrical with no nipple discharge. Left breast exam showed UOQ 2 x 2.5 cm reduced to 2 x 1.5 cm palpable mass, non tender, and smaller in size than previous exam. No palpable mass in the right breast and bilateral axilla.  LABORATORY DATA:  I have reviewed the data as  listed CBC Latest Ref Rng & Units 06/21/2016 06/14/2016 06/09/2016  WBC 3.9 - 10.3 10e3/uL 11.0(H) 14.3(H) 5.1  Hemoglobin 11.6 - 15.9 g/dL 11.4(L) 12.0 10.0(L)  Hematocrit 34.8 - 46.6 % 34.5(L) 37.1 31.2(L)  Platelets 145 - 400 10e3/uL 263 209 84(L)   CMP Latest Ref Rng & Units 06/21/2016 06/14/2016 06/09/2016  Glucose 70 - 140 mg/dl 177(H) 142(H) 112(H)  BUN 7.0 - 26.0 mg/dL 15.1 20.6 14  Creatinine 0.6 - 1.1 mg/dL 0.8 1.0 0.63  Sodium 136 - 145 mEq/L 141 138 138  Potassium 3.5 - 5.1 mEq/L 3.6 3.4(L) 3.4(L)  Chloride 101 - 111 mmol/L - - 102  CO2 22 - 29 mEq/L 24 25 26   Calcium 8.4 - 10.4 mg/dL 9.0 9.3 8.6(L)  Total Protein 6.4 - 8.3 g/dL 6.3(L) 6.6 -  Total Bilirubin 0.20 - 1.20 mg/dL 0.26 0.25 -  Alkaline Phos 40 - 150 U/L 94 113 -  AST 5 - 34 U/L 12 14 -  ALT 0 - 55 U/L 26 24 -   PATHOLOGY REPORT:   ADDITIONAL INFORMATION: 04/25/16 PROGNOSTIC INDICATORS Results: IMMUNOHISTOCHEMICAL AND MORPHOMETRIC ANALYSIS PERFORMED MANUALLY Estrogen Receptor: 0%, NEGATIVE Progesterone Receptor: 0%, NEGATIVE Proliferation Marker Ki67: 30% COMMENT: The negative hormone receptor study(ies) in this case has no internal positive control. REFERENCE RANGE ESTROGEN RECEPTOR NEGATIVE 0% POSITIVE =>1% REFERENCE RANGE PROGESTERONE RECEPTOR NEGATIVE 0% POSITIVE =>1% All controls stained appropriately Enid Cutter MD Pathologist, Electronic Signature ( Signed 05/01/2016) FLUORESCENCE IN-SITU HYBRIDIZATION Results: HER2 - **POSITIVE** RATIO OF HER2/CEP17 SIGNALS 2.25 AVERAGE HER2 COPY NUMBER PER CELL 8.45 1 of 3 FINAL for Weller, Ron P ([id number]) ADDITIONAL INFORMATION:(continued) Reference Range: NEGATIVE HER2/CEP17 Ratio <2.0 and average HER2 copy number <4.0 EQUIVOCAL HER2/CEP17 Ratio <2.0 and average HER2 copy number 4.0 and <6.0 POSITIVE HER2/CEP17 Ratio >=2.0 or <2.0 and average HER2 copy number >=6.0 Enid Cutter MD Pathologist, Electronic Signature ( Signed 04/30/2016) FINAL  DIAGNOSIS Diagnosis Breast, left, needle core biopsy - INVASIVE DUCTAL CARCINOMA, SEE COMMENT. - DUCTAL CARCINOMA IN SITU. - LYMPHOVASCULAR INVASION PRESENT. Microscopic Comment The carcinoma appears grade 3 with focal squamous differentiation. Prognostic markers will be ordered. Dr. Lyndon Code has reviewed the case. The case was called to Dr. Isaiah Blakes on 04/26/2016. Vicente Males MD Pathologist, Electronic Signature (Case signed 04/26/2016)  RADIOGRAPHIC STUDIES: I have personally reviewed the radiological images as listed and agreed with the findings in the report.  See onc history CT ABD/Pelvi 5/10/18s W CONTRAST 06/07/16 IMPRESSION: 1. Wall thickening of the descending and sigmoid colon consistent with an infectious or inflammatory colitis. 2. There is also a short segment of narrowing and possible wall thickening of the superior ascending colon which could reflect a constricting mass or, more likely, be an area of persistent colonic spasm. This could be further assessed with either colonoscopy or a barium enema after the current symptoms of colitis have resolved. 3. Small, subcentimeter, low-density liver lesions which may all be benign. However, 3 these are not evident on prior CT. Liver metastatic disease possible. These could be further assessed with liver MRI with and without contrast. 4. Adrenal lesions which are stable, on the right and myelolipoma and on the left most likely an adenoma.  CT Angio Chest PE W and/or wo Contrast 05/13/16 IMPRESSION: No evidence of pulmonary emboli.  Echo 05/09/16 Study Conclusions -LF EF: 55-60% - Left ventricle: The cavity size was normal. There was mild focal   basal hypertrophy of the septum. Indeterminant diastolic  function. Systolic function was normal. The estimated ejection   fraction was in the range of 55% to 60%. Wall motion was normal;   there were no regional wall motion abnormalities. GLS abnormal at   -13.3%, poor images  however.  ASSESSMENT & PLAN: 56 y.o. post-menopausal Caucasian female with a self palpated left breast mass.  1. Breast cancer of upper-outer quadrant of left breast, invasive ductal carcinoma,  stage IIA (cT2N0M0) grade 3, ER-, PR-, HER2 amplified -We previously reviewed the patient's imaging and pathology. -We previously reviewed her staging and biology of her breast cancer  -We previously discussed that surgical resection is the definitive treatment for breast cancer, she was seen by breast surgeon Dr. Lucia Gaskins today, lumpectomy versus mastectomy were discussed with patient. -We previously discussed HER2 positive breast cancers total approximately 15% of breast cancers and happens to be more aggressive than HER2 negative cancers, especially ER and PR negative disease, she has high likelihood of cancer recurrence after complete surgical resection.  -I recommend neoadjuvant chemotherapy TCHP (docetaxel, carboplatin, Herceptin and pejeta) every 3 weeks for 6 cycles, followed by maintenance Herceptin and pejeta to complete 1 year therapy -Her baseline echo was normal, she was seen by cardiologist Dr. Benjamine Mola -She unfortunately developed severe diarrhea and cytopenia after first cycle chemotherapy, was hospitalized for a week. She has now recovered well, still has moderate diarrhea. -cycle 2 chemo docetaxel was changed to paclitaxel, carbo and pejeta dose was reduced, unfortunately she again developed worsening diarrhea and bleeding, required a few days hospitalization. -She has recovered well, (reviewed, her cytopenia has completed her resolved, CMP are unremarkable, adequate for treatment,  adequate to continue treatment.wih cycle 3 today. Will hold Perjeta and for cycle 3 to see if her diarrhea does not get worse   2. Genetics -Given her strong family history of breast cancer, we recommend her to see genetic counseling to ruled out inheritable breast cancer syndrome. She agreed. -Genetic counseling  scheduled 05/17/16.  3. Type 2 Diabetes mellitus, HTN -Managed by her PCP. -The patient has peripheral neuropathy in her feet from her diabetes. The patient is already on Neurontin with 200 mg at night. We discussed that chemotherapy may make her neuropathy worse. -Steroids will be given to reduce chemo side effects and I will reduce dexa to 53m daily to not affect her blood sugar much. -We'll monitor her blood glucose and blood pressure closely during her chemotherapy treatment.  4. Ulcerative colitis -We previously discussed that chemotherapy would cause diarrhea and the patient's colitis may exacerbate during chemo  -She is not taking steroids for this. She is on mesalamine, will follow up with Dr. SFuller Plan -She had severe diarrhea after first cycle chemotherapy, was treated for ulcerative colitis flare, she will follow-up with Dr. SFuller Plannext week. -I previously advised the patient to keep herself adequatly hydrated and to take Imodium PRN. - I again previously advised the patient to take Lomotil and Imodium frequently as needed.  5. Arthritis  -s/p b/l hip replacement, last surgery in February 2018.   PLAN -Lab review, adequate for treatment, we'll proceed cycle 3 carboplatin, paclitaxel, and Herceptin, well controlled pejeta today due to diarrhea   -Lab, flush, f/u with APP and IVF 2hrs in one week  -F/u in 3 weeks for cycle 4   No orders of the defined types were placed in this encounter.   All questions were answered. The patient knows to call the clinic with any problems, questions or concerns.  I spent 20 minutes  counseling the patient face to face. The total time spent in the appointment was 25 minutes and more than 50% was on counseling.  This document serves as a record of services personally performed by Truitt Merle, MD. It was created on her behalf by Joslyn Devon, a trained medical scribe. The creation of this record is based on the scribe's personal observations and the  provider's statements to them. This document has been checked and approved by the attending provider.     Truitt Merle, MD 06/21/2016

## 2016-06-20 NOTE — Telephone Encounter (Signed)
No los per 06/14/16 visit.

## 2016-06-21 ENCOUNTER — Ambulatory Visit (HOSPITAL_BASED_OUTPATIENT_CLINIC_OR_DEPARTMENT_OTHER): Payer: 59

## 2016-06-21 ENCOUNTER — Ambulatory Visit: Payer: 59

## 2016-06-21 ENCOUNTER — Ambulatory Visit (HOSPITAL_BASED_OUTPATIENT_CLINIC_OR_DEPARTMENT_OTHER): Payer: 59 | Admitting: Hematology

## 2016-06-21 ENCOUNTER — Telehealth: Payer: Self-pay | Admitting: Hematology

## 2016-06-21 ENCOUNTER — Encounter: Payer: Self-pay | Admitting: *Deleted

## 2016-06-21 ENCOUNTER — Other Ambulatory Visit (HOSPITAL_BASED_OUTPATIENT_CLINIC_OR_DEPARTMENT_OTHER): Payer: 59

## 2016-06-21 VITALS — BP 116/67 | HR 63 | Temp 98.3°F | Resp 20 | Ht 64.75 in | Wt 200.0 lb

## 2016-06-21 DIAGNOSIS — Z171 Estrogen receptor negative status [ER-]: Principal | ICD-10-CM

## 2016-06-21 DIAGNOSIS — C50412 Malignant neoplasm of upper-outer quadrant of left female breast: Secondary | ICD-10-CM | POA: Diagnosis not present

## 2016-06-21 DIAGNOSIS — E119 Type 2 diabetes mellitus without complications: Secondary | ICD-10-CM | POA: Diagnosis not present

## 2016-06-21 DIAGNOSIS — I10 Essential (primary) hypertension: Secondary | ICD-10-CM | POA: Diagnosis not present

## 2016-06-21 DIAGNOSIS — Z5111 Encounter for antineoplastic chemotherapy: Secondary | ICD-10-CM | POA: Diagnosis not present

## 2016-06-21 DIAGNOSIS — Z5112 Encounter for antineoplastic immunotherapy: Secondary | ICD-10-CM | POA: Diagnosis not present

## 2016-06-21 DIAGNOSIS — Z95828 Presence of other vascular implants and grafts: Secondary | ICD-10-CM

## 2016-06-21 DIAGNOSIS — K5190 Ulcerative colitis, unspecified, without complications: Secondary | ICD-10-CM | POA: Diagnosis not present

## 2016-06-21 DIAGNOSIS — K51311 Ulcerative (chronic) rectosigmoiditis with rectal bleeding: Secondary | ICD-10-CM

## 2016-06-21 LAB — COMPREHENSIVE METABOLIC PANEL
ALBUMIN: 3.3 g/dL — AB (ref 3.5–5.0)
ALT: 26 U/L (ref 0–55)
ANION GAP: 11 meq/L (ref 3–11)
AST: 12 U/L (ref 5–34)
Alkaline Phosphatase: 94 U/L (ref 40–150)
BUN: 15.1 mg/dL (ref 7.0–26.0)
CALCIUM: 9 mg/dL (ref 8.4–10.4)
CHLORIDE: 105 meq/L (ref 98–109)
CO2: 24 mEq/L (ref 22–29)
Creatinine: 0.8 mg/dL (ref 0.6–1.1)
EGFR: 81 mL/min/{1.73_m2} — ABNORMAL LOW (ref >90)
Glucose: 177 mg/dl — ABNORMAL HIGH (ref 70–140)
POTASSIUM: 3.6 meq/L (ref 3.5–5.1)
Sodium: 141 mEq/L (ref 136–145)
Total Bilirubin: 0.26 mg/dL (ref 0.20–1.20)
Total Protein: 6.3 g/dL — ABNORMAL LOW (ref 6.4–8.3)

## 2016-06-21 LAB — CBC WITH DIFFERENTIAL/PLATELET
BASO%: 0.2 % (ref 0.0–2.0)
BASOS ABS: 0 10*3/uL (ref 0.0–0.1)
EOS ABS: 0 10*3/uL (ref 0.0–0.5)
EOS%: 0 % (ref 0.0–7.0)
HEMATOCRIT: 34.5 % — AB (ref 34.8–46.6)
HEMOGLOBIN: 11.4 g/dL — AB (ref 11.6–15.9)
LYMPH#: 1.1 10*3/uL (ref 0.9–3.3)
LYMPH%: 9.6 % — ABNORMAL LOW (ref 14.0–49.7)
MCH: 29.8 pg (ref 25.1–34.0)
MCHC: 33 g/dL (ref 31.5–36.0)
MCV: 90.5 fL (ref 79.5–101.0)
MONO#: 0.4 10*3/uL (ref 0.1–0.9)
MONO%: 4.1 % (ref 0.0–14.0)
NEUT#: 9.4 10*3/uL — ABNORMAL HIGH (ref 1.5–6.5)
NEUT%: 86.1 % — AB (ref 38.4–76.8)
Platelets: 263 10*3/uL (ref 145–400)
RBC: 3.81 10*6/uL (ref 3.70–5.45)
RDW: 18.2 % — ABNORMAL HIGH (ref 11.2–14.5)
WBC: 11 10*3/uL — ABNORMAL HIGH (ref 3.9–10.3)

## 2016-06-21 MED ORDER — HEPARIN SOD (PORK) LOCK FLUSH 100 UNIT/ML IV SOLN
500.0000 [IU] | Freq: Once | INTRAVENOUS | Status: AC | PRN
  Administered 2016-06-21: 500 [IU]
  Filled 2016-06-21: qty 5

## 2016-06-21 MED ORDER — SODIUM CHLORIDE 0.9 % IV SOLN
603.0000 mg | Freq: Once | INTRAVENOUS | Status: AC
  Administered 2016-06-21: 600 mg via INTRAVENOUS
  Filled 2016-06-21: qty 60

## 2016-06-21 MED ORDER — SODIUM CHLORIDE 0.9% FLUSH
10.0000 mL | INTRAVENOUS | Status: DC | PRN
  Administered 2016-06-21: 10 mL
  Filled 2016-06-21: qty 10

## 2016-06-21 MED ORDER — FAMOTIDINE IN NACL 20-0.9 MG/50ML-% IV SOLN
20.0000 mg | Freq: Once | INTRAVENOUS | Status: AC
  Administered 2016-06-21: 20 mg via INTRAVENOUS

## 2016-06-21 MED ORDER — SODIUM CHLORIDE 0.9 % IV SOLN
Freq: Once | INTRAVENOUS | Status: AC
  Administered 2016-06-21: 12:00:00 via INTRAVENOUS

## 2016-06-21 MED ORDER — LORATADINE 10 MG PO TABS
10.0000 mg | ORAL_TABLET | Freq: Once | ORAL | Status: AC
  Administered 2016-06-21: 10 mg via ORAL
  Filled 2016-06-21: qty 1

## 2016-06-21 MED ORDER — PACLITAXEL CHEMO INJECTION 300 MG/50ML
175.0000 mg/m2 | Freq: Once | INTRAVENOUS | Status: AC
  Administered 2016-06-21: 372 mg via INTRAVENOUS
  Filled 2016-06-21: qty 62

## 2016-06-21 MED ORDER — PALONOSETRON HCL INJECTION 0.25 MG/5ML
0.2500 mg | Freq: Once | INTRAVENOUS | Status: AC
  Administered 2016-06-21: 0.25 mg via INTRAVENOUS

## 2016-06-21 MED ORDER — ACETAMINOPHEN 325 MG PO TABS
ORAL_TABLET | ORAL | Status: AC
  Filled 2016-06-21: qty 2

## 2016-06-21 MED ORDER — DEXAMETHASONE SODIUM PHOSPHATE 10 MG/ML IJ SOLN
INTRAMUSCULAR | Status: AC
  Filled 2016-06-21: qty 1

## 2016-06-21 MED ORDER — DIPHENHYDRAMINE HCL 25 MG PO CAPS: 50.0000 mg | ORAL_CAPSULE | Freq: Once | ORAL | Status: DC

## 2016-06-21 MED ORDER — SODIUM CHLORIDE 0.9% FLUSH
10.0000 mL | Freq: Once | INTRAVENOUS | Status: AC
  Administered 2016-06-21: 10 mL
  Filled 2016-06-21: qty 10

## 2016-06-21 MED ORDER — DEXAMETHASONE SODIUM PHOSPHATE 10 MG/ML IJ SOLN
10.0000 mg | Freq: Once | INTRAMUSCULAR | Status: AC
  Administered 2016-06-21: 10 mg via INTRAVENOUS

## 2016-06-21 MED ORDER — TRASTUZUMAB CHEMO 150 MG IV SOLR
6.0000 mg/kg | Freq: Once | INTRAVENOUS | Status: AC
  Administered 2016-06-21: 588 mg via INTRAVENOUS
  Filled 2016-06-21: qty 28

## 2016-06-21 MED ORDER — FAMOTIDINE IN NACL 20-0.9 MG/50ML-% IV SOLN
INTRAVENOUS | Status: AC
  Filled 2016-06-21: qty 50

## 2016-06-21 MED ORDER — ACETAMINOPHEN 325 MG PO TABS
650.0000 mg | ORAL_TABLET | Freq: Once | ORAL | Status: AC
Start: 2016-06-21 — End: 2016-06-21
  Administered 2016-06-21: 650 mg via ORAL

## 2016-06-21 MED ORDER — PALONOSETRON HCL INJECTION 0.25 MG/5ML
INTRAVENOUS | Status: AC
  Filled 2016-06-21: qty 5

## 2016-06-21 MED ORDER — SODIUM CHLORIDE 0.9 % IV SOLN: Freq: Once | INTRAVENOUS | Status: DC

## 2016-06-21 NOTE — Telephone Encounter (Signed)
Gave patient avs report and appointments for May and June.

## 2016-06-21 NOTE — Patient Instructions (Signed)
Brandi Dickson Discharge Instructions for Patients Receiving Chemotherapy  Today you received the following chemotherapy agents: Herceptin, Taxol and Carboplatin   To help prevent nausea and vomiting after your treatment, we encourage you to take your nausea medication as directed.    If you develop nausea and vomiting that is not controlled by your nausea medication, call the clinic.   BELOW ARE SYMPTOMS THAT SHOULD BE REPORTED IMMEDIATELY:  *FEVER GREATER THAN 100.5 F  *CHILLS WITH OR WITHOUT FEVER  NAUSEA AND VOMITING THAT IS NOT CONTROLLED WITH YOUR NAUSEA MEDICATION  *UNUSUAL SHORTNESS OF BREATH  *UNUSUAL BRUISING OR BLEEDING  TENDERNESS IN MOUTH AND THROAT WITH OR WITHOUT PRESENCE OF ULCERS  *URINARY PROBLEMS  *BOWEL PROBLEMS  UNUSUAL RASH Items with * indicate a potential emergency and should be followed up as soon as possible.  Feel free to call the clinic you have any questions or concerns. The clinic phone number is [phone].  Please show the [facility] at check-in to the Emergency Department and triage nurse.    Paclitaxel injection What is this medicine? PACLITAXEL (PAK li TAX el) is a chemotherapy drug. It targets fast dividing cells, like cancer cells, and causes these cells to die. This medicine is used to treat ovarian cancer, breast cancer, and other cancers. This medicine may be used for other purposes; ask your health care provider or pharmacist if you have questions. COMMON BRAND NAME(S): Onxol, Taxol What should I tell my health care provider before I take this medicine? They need to know if you have any of these conditions: -blood disorders -irregular heartbeat -infection (especially a virus infection such as chickenpox, cold sores, or herpes) -liver disease -previous or ongoing radiation therapy -an unusual or allergic reaction to paclitaxel, alcohol, polyoxyethylated castor oil, other chemotherapy agents, other medicines,  foods, dyes, or preservatives -pregnant or trying to get pregnant -breast-feeding How should I use this medicine? This drug is given as an infusion into a vein. It is administered in a hospital or clinic by a specially trained health care professional. Talk to your pediatrician regarding the use of this medicine in children. Special care may be needed. Overdosage: If you think you have taken too much of this medicine contact a poison control center or emergency room at once. NOTE: This medicine is only for you. Do not share this medicine with others. What if I miss a dose? It is important not to miss your dose. Call your doctor or health care professional if you are unable to keep an appointment. What may interact with this medicine? Do not take this medicine with any of the following medications: -disulfiram -metronidazole This medicine may also interact with the following medications: -cyclosporine -diazepam -ketoconazole -medicines to increase blood counts like filgrastim, pegfilgrastim, sargramostim -other chemotherapy drugs like cisplatin, doxorubicin, epirubicin, etoposide, teniposide, vincristine -quinidine -testosterone -vaccines -verapamil Talk to your doctor or health care professional before taking any of these medicines: -acetaminophen -aspirin -ibuprofen -ketoprofen -naproxen This list may not describe all possible interactions. Give your health care provider a list of all the medicines, herbs, non-prescription drugs, or dietary supplements you use. Also tell them if you smoke, drink alcohol, or use illegal drugs. Some items may interact with your medicine. What should I watch for while using this medicine? Your condition will be monitored carefully while you are receiving this medicine. You will need important blood work done while you are taking this medicine. This medicine can cause serious allergic reactions. To reduce your  risk you will need to take other  medicine(s) before treatment with this medicine. If you experience allergic reactions like skin rash, itching or hives, swelling of the face, lips, or tongue, tell your doctor or health care professional right away. In some cases, you may be given additional medicines to help with side effects. Follow all directions for their use. This drug may make you feel generally unwell. This is not uncommon, as chemotherapy can affect healthy cells as well as cancer cells. Report any side effects. Continue your course of treatment even though you feel ill unless your doctor tells you to stop. Call your doctor or health care professional for advice if you get a fever, chills or sore throat, or other symptoms of a cold or flu. Do not treat yourself. This drug decreases your body's ability to fight infections. Try to avoid being around people who are sick. This medicine may increase your risk to bruise or bleed. Call your doctor or health care professional if you notice any unusual bleeding. Be careful brushing and flossing your teeth or using a toothpick because you may get an infection or bleed more easily. If you have any dental work done, tell your dentist you are receiving this medicine. Avoid taking products that contain aspirin, acetaminophen, ibuprofen, naproxen, or ketoprofen unless instructed by your doctor. These medicines may hide a fever. Do not become pregnant while taking this medicine. Women should inform their doctor if they wish to become pregnant or think they might be pregnant. There is a potential for serious side effects to an unborn child. Talk to your health care professional or pharmacist for more information. Do not breast-feed an infant while taking this medicine. Men are advised not to father a child while receiving this medicine. This product may contain alcohol. Ask your pharmacist or healthcare provider if this medicine contains alcohol. Be sure to tell all healthcare providers you are  taking this medicine. Certain medicines, like metronidazole and disulfiram, can cause an unpleasant reaction when taken with alcohol. The reaction includes flushing, headache, nausea, vomiting, sweating, and increased thirst. The reaction can last from 30 minutes to several hours. What side effects may I notice from receiving this medicine? Side effects that you should report to your doctor or health care professional as soon as possible: -allergic reactions like skin rash, itching or hives, swelling of the face, lips, or tongue -low blood counts - This drug may decrease the number of white blood cells, red blood cells and platelets. You may be at increased risk for infections and bleeding. -signs of infection - fever or chills, cough, sore throat, pain or difficulty passing urine -signs of decreased platelets or bleeding - bruising, pinpoint red spots on the skin, black, tarry stools, nosebleeds -signs of decreased red blood cells - unusually weak or tired, fainting spells, lightheadedness -breathing problems -chest pain -high or low blood pressure -mouth sores -nausea and vomiting -pain, swelling, redness or irritation at the injection site -pain, tingling, numbness in the hands or feet -slow or irregular heartbeat -swelling of the ankle, feet, hands Side effects that usually do not require medical attention (report to your doctor or health care professional if they continue or are bothersome): -bone pain -complete hair loss including hair on your head, underarms, pubic hair, eyebrows, and eyelashes -changes in the color of fingernails -diarrhea -loosening of the fingernails -loss of appetite -muscle or joint pain -red flush to skin -sweating This list may not describe all possible side effects. Call your  doctor for medical advice about side effects. You may report side effects to FDA at 1-800-FDA-1088. Where should I keep my medicine? This drug is given in a hospital or clinic and  will not be stored at home. NOTE: This sheet is a summary. It may not cover all possible information. If you have questions about this medicine, talk to your doctor, pharmacist, or health care provider.  2018 Elsevier/Gold Standard (2014-11-16 19:58:00)

## 2016-06-23 ENCOUNTER — Encounter: Payer: Self-pay | Admitting: Hematology

## 2016-06-23 ENCOUNTER — Ambulatory Visit (HOSPITAL_BASED_OUTPATIENT_CLINIC_OR_DEPARTMENT_OTHER): Payer: 59

## 2016-06-23 VITALS — BP 125/77 | HR 86 | Temp 98.3°F | Resp 18

## 2016-06-23 DIAGNOSIS — Z171 Estrogen receptor negative status [ER-]: Principal | ICD-10-CM

## 2016-06-23 DIAGNOSIS — Z5189 Encounter for other specified aftercare: Secondary | ICD-10-CM

## 2016-06-23 DIAGNOSIS — C50412 Malignant neoplasm of upper-outer quadrant of left female breast: Secondary | ICD-10-CM

## 2016-06-23 MED ORDER — PEGFILGRASTIM INJECTION 6 MG/0.6ML ~~LOC~~
6.0000 mg | PREFILLED_SYRINGE | Freq: Once | SUBCUTANEOUS | Status: AC
  Administered 2016-06-23: 6 mg via SUBCUTANEOUS

## 2016-06-23 NOTE — Patient Instructions (Signed)
Pegfilgrastim injection What is this medicine? PEGFILGRASTIM (PEG fil gra stim) is a long-acting granulocyte colony-stimulating factor that stimulates the growth of neutrophils, a type of white blood cell important in the body's fight against infection. It is used to reduce the incidence of fever and infection in patients with certain types of cancer who are receiving chemotherapy that affects the bone marrow, and to increase survival after being exposed to high doses of radiation. This medicine may be used for other purposes; ask your health care provider or pharmacist if you have questions. COMMON BRAND NAME(S): Neulasta What should I tell my health care provider before I take this medicine? They need to know if you have any of these conditions: -kidney disease -latex allergy -ongoing radiation therapy -sickle cell disease -skin reactions to acrylic adhesives (On-Body Injector only) -an unusual or allergic reaction to pegfilgrastim, filgrastim, other medicines, foods, dyes, or preservatives -pregnant or trying to get pregnant -breast-feeding How should I use this medicine? This medicine is for injection under the skin. If you get this medicine at home, you will be taught how to prepare and give the pre-filled syringe or how to use the On-body Injector. Refer to the patient Instructions for Use for detailed instructions. Use exactly as directed. Tell your healthcare provider immediately if you suspect that the On-body Injector may not have performed as intended or if you suspect the use of the On-body Injector resulted in a missed or partial dose. It is important that you put your used needles and syringes in a special sharps container. Do not put them in a trash can. If you do not have a sharps container, call your pharmacist or healthcare provider to get one. Talk to your pediatrician regarding the use of this medicine in children. While this drug may be prescribed for selected conditions,  precautions do apply. Overdosage: If you think you have taken too much of this medicine contact a poison control center or emergency room at once. NOTE: This medicine is only for you. Do not share this medicine with others. What if I miss a dose? It is important not to miss your dose. Call your doctor or health care professional if you miss your dose. If you miss a dose due to an On-body Injector failure or leakage, a new dose should be administered as soon as possible using a single prefilled syringe for manual use. What may interact with this medicine? Interactions have not been studied. Give your health care provider a list of all the medicines, herbs, non-prescription drugs, or dietary supplements you use. Also tell them if you smoke, drink alcohol, or use illegal drugs. Some items may interact with your medicine. This list may not describe all possible interactions. Give your health care provider a list of all the medicines, herbs, non-prescription drugs, or dietary supplements you use. Also tell them if you smoke, drink alcohol, or use illegal drugs. Some items may interact with your medicine. What should I watch for while using this medicine? You may need blood work done while you are taking this medicine. If you are going to need a MRI, CT scan, or other procedure, tell your doctor that you are using this medicine (On-Body Injector only). What side effects may I notice from receiving this medicine? Side effects that you should report to your doctor or health care professional as soon as possible: -allergic reactions like skin rash, itching or hives, swelling of the face, lips, or tongue -dizziness -fever -pain, redness, or irritation at site   where injected -pinpoint red spots on the skin -red or dark-brown urine -shortness of breath or breathing problems -stomach or side pain, or pain at the shoulder -swelling -tiredness -trouble passing urine or change in the amount of urine Side  effects that usually do not require medical attention (report to your doctor or health care professional if they continue or are bothersome): -bone pain -muscle pain This list may not describe all possible side effects. Call your doctor for medical advice about side effects. You may report side effects to FDA at 1-800-FDA-1088. Where should I keep my medicine? Keep out of the reach of children. Store pre-filled syringes in a refrigerator between 2 and 8 degrees C (36 and 46 degrees F). Do not freeze. Keep in carton to protect from light. Throw away this medicine if it is left out of the refrigerator for more than 48 hours. Throw away any unused medicine after the expiration date. NOTE: This sheet is a summary. It may not cover all possible information. If you have questions about this medicine, talk to your doctor, pharmacist, or health care provider.  2018 Elsevier/Gold Standard (2016-01-12 12:58:03)  

## 2016-06-26 ENCOUNTER — Telehealth: Payer: Self-pay

## 2016-06-26 DIAGNOSIS — R05 Cough: Secondary | ICD-10-CM | POA: Diagnosis not present

## 2016-06-26 NOTE — Telephone Encounter (Signed)
Pt called with chest congestion and phlegm. SOB. Extremely weak.  Called her back and she is in PCP office. The md said her lungs are crackley and they are waiting for results of CXR. She denies fever. Told her is was OK to take antibiotic if MD prescribes it. She will have MD send Dr Burr Medico his notes. Asked her to call with an update after her visit is finished.

## 2016-06-27 ENCOUNTER — Telehealth: Payer: Self-pay | Admitting: Hematology

## 2016-06-27 NOTE — Telephone Encounter (Signed)
Pt was seen by her PCP Dr. Doyle Askew yesterday for dyspnea, orthopnea and wheezing. Chest x-ray was negative. Dr. Doyle Askew recommended her to increase prednisone from 10 mg 1-40 mg daily, and use albuterol neb at home. She feels better today, able to lay flat, less SOB. I spoke with pt, and also recommend her to increase HCTZ-TRIAMTERENE from 1/2 to 1 tab daily for 3 days. She is scheduled to see [place] in 2 days. I also spoke with Dr. Doyle Askew today.   Truitt Merle  06/27/2016

## 2016-06-29 ENCOUNTER — Ambulatory Visit (HOSPITAL_BASED_OUTPATIENT_CLINIC_OR_DEPARTMENT_OTHER): Payer: 59 | Admitting: Nurse Practitioner

## 2016-06-29 ENCOUNTER — Ambulatory Visit: Payer: 59

## 2016-06-29 ENCOUNTER — Ambulatory Visit (HOSPITAL_BASED_OUTPATIENT_CLINIC_OR_DEPARTMENT_OTHER): Payer: 59

## 2016-06-29 ENCOUNTER — Other Ambulatory Visit (HOSPITAL_BASED_OUTPATIENT_CLINIC_OR_DEPARTMENT_OTHER): Payer: 59

## 2016-06-29 VITALS — BP 128/78 | HR 108 | Temp 98.1°F | Resp 19 | Ht 64.75 in | Wt 194.7 lb

## 2016-06-29 DIAGNOSIS — C50412 Malignant neoplasm of upper-outer quadrant of left female breast: Secondary | ICD-10-CM

## 2016-06-29 DIAGNOSIS — Z171 Estrogen receptor negative status [ER-]: Secondary | ICD-10-CM | POA: Diagnosis not present

## 2016-06-29 DIAGNOSIS — Z95828 Presence of other vascular implants and grafts: Secondary | ICD-10-CM

## 2016-06-29 DIAGNOSIS — E860 Dehydration: Secondary | ICD-10-CM

## 2016-06-29 DIAGNOSIS — E119 Type 2 diabetes mellitus without complications: Secondary | ICD-10-CM | POA: Diagnosis not present

## 2016-06-29 LAB — COMPREHENSIVE METABOLIC PANEL
ALT: 25 U/L (ref 0–55)
ANION GAP: 16 meq/L — AB (ref 3–11)
AST: 15 U/L (ref 5–34)
Albumin: 3.6 g/dL (ref 3.5–5.0)
Alkaline Phosphatase: 124 U/L (ref 40–150)
BUN: 28.3 mg/dL — ABNORMAL HIGH (ref 7.0–26.0)
CHLORIDE: 94 meq/L — AB (ref 98–109)
CO2: 25 meq/L (ref 22–29)
CREATININE: 1.2 mg/dL — AB (ref 0.6–1.1)
Calcium: 9.9 mg/dL (ref 8.4–10.4)
EGFR: 50 mL/min/{1.73_m2} — ABNORMAL LOW (ref >90)
Glucose: 216 mg/dl — ABNORMAL HIGH (ref 70–140)
POTASSIUM: 3.8 meq/L (ref 3.5–5.1)
Sodium: 135 mEq/L — ABNORMAL LOW (ref 136–145)
Total Bilirubin: 0.29 mg/dL (ref 0.20–1.20)
Total Protein: 7.1 g/dL (ref 6.4–8.3)

## 2016-06-29 LAB — CBC WITH DIFFERENTIAL/PLATELET
BASO%: 0.6 % (ref 0.0–2.0)
BASOS ABS: 0.1 10*3/uL (ref 0.0–0.1)
EOS%: 0 % (ref 0.0–7.0)
Eosinophils Absolute: 0 10*3/uL (ref 0.0–0.5)
HCT: 37.2 % (ref 34.8–46.6)
HGB: 12.3 g/dL (ref 11.6–15.9)
LYMPH%: 14.4 % (ref 14.0–49.7)
MCH: 29.7 pg (ref 25.1–34.0)
MCHC: 33.2 g/dL (ref 31.5–36.0)
MCV: 89.6 fL (ref 79.5–101.0)
MONO#: 1.1 10*3/uL — ABNORMAL HIGH (ref 0.1–0.9)
MONO%: 7.4 % (ref 0.0–14.0)
NEUT#: 11.1 10*3/uL — ABNORMAL HIGH (ref 1.5–6.5)
NEUT%: 77.6 % — AB (ref 38.4–76.8)
Platelets: 183 10*3/uL (ref 145–400)
RBC: 4.15 10*6/uL (ref 3.70–5.45)
RDW: 17.6 % — ABNORMAL HIGH (ref 11.2–14.5)
WBC: 14.3 10*3/uL — ABNORMAL HIGH (ref 3.9–10.3)
lymph#: 2.1 10*3/uL (ref 0.9–3.3)

## 2016-06-29 MED ORDER — SODIUM CHLORIDE 0.9% FLUSH
10.0000 mL | Freq: Once | INTRAVENOUS | Status: AC
  Administered 2016-06-29: 10 mL
  Filled 2016-06-29: qty 10

## 2016-06-29 MED ORDER — DICYCLOMINE HCL 20 MG PO TABS
20.0000 mg | ORAL_TABLET | Freq: Three times a day (TID) | ORAL | 0 refills | Status: DC
Start: 2016-06-29 — End: 2017-06-05

## 2016-06-29 MED ORDER — SODIUM CHLORIDE 0.9 % IV SOLN
1000.0000 mL | INTRAVENOUS | Status: AC
  Administered 2016-06-29: 1000 mL via INTRAVENOUS

## 2016-06-29 MED ORDER — HEPARIN SOD (PORK) LOCK FLUSH 100 UNIT/ML IV SOLN
500.0000 [IU] | Freq: Once | INTRAVENOUS | Status: AC
  Administered 2016-06-29: 500 [IU]
  Filled 2016-06-29: qty 5

## 2016-06-29 NOTE — Patient Instructions (Signed)
Dehydration, Adult Dehydration is when there is not enough fluid or water in your body. This happens when you lose more fluids than you take in. Dehydration can range from mild to very bad. It should be treated right away to keep it from getting very bad. Symptoms of mild dehydration may include:  Thirst.  Dry lips.  Slightly dry mouth.  Dry, warm skin.  Dizziness. Symptoms of moderate dehydration may include:  Very dry mouth.  Muscle cramps.  Dark pee (urine). Pee may be the color of tea.  Your body making less pee.  Your eyes making fewer tears.  Heartbeat that is uneven or faster than normal (palpitations).  Headache.  Light-headedness, especially when you stand up from sitting.  Fainting (syncope). Symptoms of very bad dehydration may include:  Changes in skin, such as: ? Cold and clammy skin. ? Blotchy (mottled) or pale skin. ? Skin that does not quickly return to normal after being lightly pinched and let go (poor skin turgor).  Changes in body fluids, such as: ? Feeling very thirsty. ? Your eyes making fewer tears. ? Not sweating when body temperature is high, such as in hot weather. ? Your body making very little pee.  Changes in vital signs, such as: ? Weak pulse. ? Pulse that is more than 100 beats a minute when you are sitting still. ? Fast breathing. ? Low blood pressure.  Other changes, such as: ? Sunken eyes. ? Cold hands and feet. ? Confusion. ? Lack of energy (lethargy). ? Trouble waking up from sleep. ? Short-term weight loss. ? Unconsciousness. Follow these instructions at home:  If told by your doctor, drink an ORS: ? Make an ORS by using instructions on the package. ? Start by drinking small amounts, about  cup (120 mL) every 5-10 minutes. ? Slowly drink more until you have had the amount that your doctor said to have.  Drink enough clear fluid to keep your pee clear or pale yellow. If you were told to drink an ORS, finish the ORS  first, then start slowly drinking clear fluids. Drink fluids such as: ? Water. Do not drink only water by itself. Doing that can make the salt (sodium) level in your body get too low (hyponatremia). ? Ice chips. ? Fruit juice that you have added water to (diluted). ? Low-calorie sports drinks.  Avoid: ? Alcohol. ? Drinks that have a lot of sugar. These include high-calorie sports drinks, fruit juice that does not have water added, and soda. ? Caffeine. ? Foods that are greasy or have a lot of fat or sugar.  Take over-the-counter and prescription medicines only as told by your doctor.  Do not take salt tablets. Doing that can make the salt level in your body get too high (hypernatremia).  Eat foods that have minerals (electrolytes). Examples include bananas, oranges, potatoes, tomatoes, and spinach.  Keep all follow-up visits as told by your doctor. This is important. Contact a doctor if:  You have belly (abdominal) pain that: ? Gets worse. ? Stays in one area (localizes).  You have a rash.  You have a stiff neck.  You get angry or annoyed more easily than normal (irritability).  You are more sleepy than normal.  You have a harder time waking up than normal.  You feel: ? Weak. ? Dizzy. ? Very thirsty.  You have peed (urinated) only a small amount of very dark pee during 6-8 hours. Get help right away if:  You have symptoms of   very bad dehydration.  You cannot drink fluids without throwing up (vomiting).  Your symptoms get worse with treatment.  You have a fever.  You have a very bad headache.  You are throwing up or having watery poop (diarrhea) and it: ? Gets worse. ? Does not go away.  You have blood or something green (bile) in your throw-up.  You have blood in your poop (stool). This may cause poop to look black and tarry.  You have not peed in 6-8 hours.  You pass out (faint).  Your heart rate when you are sitting still is more than 100 beats a  minute.  You have trouble breathing. This information is not intended to replace advice given to you by your health care provider. Make sure you discuss any questions you have with your health care provider. Document Released: 11/11/2008 Document Revised: 08/05/2015 Document Reviewed: 03/11/2015 Elsevier Interactive Patient Education  2018 Elsevier Inc.  

## 2016-06-29 NOTE — Patient Instructions (Signed)
Implanted Port Home Guide An implanted port is a type of central line that is placed under the skin. Central lines are used to provide IV access when treatment or nutrition needs to be given through a person's veins. Implanted ports are used for long-term IV access. An implanted port may be placed because:  You need IV medicine that would be irritating to the small veins in your hands or arms.  You need long-term IV medicines, such as antibiotics.  You need IV nutrition for a long period.  You need frequent blood draws for lab tests.  You need dialysis.  Implanted ports are usually placed in the chest area, but they can also be placed in the upper arm, the abdomen, or the leg. An implanted port has two main parts:  Reservoir. The reservoir is round and will appear as a small, raised area under your skin. The reservoir is the part where a needle is inserted to give medicines or draw blood.  Catheter. The catheter is a thin, flexible tube that extends from the reservoir. The catheter is placed into a large vein. Medicine that is inserted into the reservoir goes into the catheter and then into the vein.  How will I care for my incision site? Do not get the incision site wet. Bathe or shower as directed by your health care provider. How is my port accessed? Special steps must be taken to access the port:  Before the port is accessed, a numbing cream can be placed on the skin. This helps numb the skin over the port site.  Your health care provider uses a sterile technique to access the port. ? Your health care provider must put on a mask and sterile gloves. ? The skin over your port is cleaned carefully with an antiseptic and allowed to dry. ? The port is gently pinched between sterile gloves, and a needle is inserted into the port.  Only "non-coring" port needles should be used to access the port. Once the port is accessed, a blood return should be checked. This helps ensure that the port  is in the vein and is not clogged.  If your port needs to remain accessed for a constant infusion, a clear (transparent) bandage will be placed over the needle site. The bandage and needle will need to be changed every week, or as directed by your health care provider.  Keep the bandage covering the needle clean and dry. Do not get it wet. Follow your health care provider's instructions on how to take a shower or bath while the port is accessed.  If your port does not need to stay accessed, no bandage is needed over the port.  What is flushing? Flushing helps keep the port from getting clogged. Follow your health care provider's instructions on how and when to flush the port. Ports are usually flushed with saline solution or a medicine called heparin. The need for flushing will depend on how the port is used.  If the port is used for intermittent medicines or blood draws, the port will need to be flushed: ? After medicines have been given. ? After blood has been drawn. ? As part of routine maintenance.  If a constant infusion is running, the port may not need to be flushed.  How long will my port stay implanted? The port can stay in for as long as your health care provider thinks it is needed. When it is time for the port to come out, surgery will be   done to remove it. The procedure is similar to the one performed when the port was put in. When should I seek immediate medical care? When you have an implanted port, you should seek immediate medical care if:  You notice a bad smell coming from the incision site.  You have swelling, redness, or drainage at the incision site.  You have more swelling or pain at the port site or the surrounding area.  You have a fever that is not controlled with medicine.  This information is not intended to replace advice given to you by your health care provider. Make sure you discuss any questions you have with your health care provider. Document  Released: 01/15/2005 Document Revised: 06/23/2015 Document Reviewed: 09/22/2012 Elsevier Interactive Patient Education  2017 [employer]  

## 2016-06-29 NOTE — Progress Notes (Signed)
[place] OFFICE PROGRESS NOTE   Diagnosis:  Left breast cancer Oncology History   Cancer Staging Breast cancer of upper-outer quadrant of left female breast [facility] [place]) Staging form: Breast, AJCC 8th Edition - Clinical stage from 04/25/2016: Stage IIA (cT2, cN0, cM0, G3, ER: Negative, PR: Negative, HER2: Positive) - Signed by Truitt Merle, MD on 05/02/2016       Breast cancer of upper-outer quadrant of left female breast ([place])   04/24/2016 Mammogram    Category B breasts with a new irregular mass in the UOQ left breast middle depth. Ultrasound revealed a 3.9 cm mass in the 1:00 position. The left axilla was negative.       04/25/2016 Initial Biopsy    Biopsy of the left breast showed grade 3 invasive ductal carcinoma, DCIS, and lymphovascular invasion was present.      04/25/2016 Receptors her2    ER 0% negative, PR 0% negative, HER2 positive, Ki67 30%      05/02/2016 Initial Diagnosis    Breast cancer of upper-outer quadrant of left female breast ([place])     05/07/2016 Imaging    MRI of the bilateral breast 05/07/16 IMPRESSION: Lobulated enhancing mass (4.0 x 2.7 x 3.1 cm) in the upper-outer quadrant of the left breast corresponding with the recently diagnosed invasive mammary carcinoma. Linear enhancement extends 2.6 cm posterior to the mass worrisome for ductal carcinoma in-situ.      05/09/2016 Echocardiogram    Echo 05/09/16 -LF EF: 55-60%      05/11/2016 -  Neo-Adjuvant Chemotherapy    Docetaxel, Carboplatin, Herceptin and pejeta (TCHP) every 3 weeks, for total of 6 cycles, followed by Herceptin and perjeta maintenance therapy to complete 1 year treatment   starting 05/11/16; Docetaxel changed to Taxol, carbo dose reduced to AUC 4.5 (from 5)  from cycle 2 due to severe diarrhea and cytopenia, on 06/01/16. Will hold Perjeta and add herceptin for cycle 3       05/13/2016 Imaging    CT Angio Chest PE IMPRESSION: No evidence of  pulmonary emboli. Left breast mass consistent with the given clinical history. Stable left adrenal lesion likely representing a small adenoma.      05/18/2016 - 05/24/2016 Hospital Admission    Patient presented with nausea, vomiting, and diarrhea; admitted to hospital with Hyponatremia      06/07/2016 - 06/09/2016 Hospital Admission    Patient presents to hospital complaints of rectal bleeding and abdominal cramps when defacating      06/07/2016 Imaging    CT ABDOMEN PELVIS W CONTRAST  IMPRESSION: 1. Wall thickening of the descending and sigmoid colon consistent with an infectious or inflammatory colitis. 2. There is also a short segment of narrowing and possible wall thickening of the superior ascending colon which could reflect a constricting mass or, more likely, be an area of persistent colonic spasm. This could be further assessed with either colonoscopy or a barium enema after the current symptoms of colitis have resolved. 3. Small, subcentimeter, low-density liver lesions which may all be benign. However, 3 these are not evident on prior CT. Liver metastatic disease possible. These could be further assessed with liver MRI with and without contrast. 4. Adrenal lesions which are stable, on the right and myelolipoma and on the left most likely an adenoma.      06/07/2016 Imaging    CT A/P IMPRESSION: 1. Wall thickening of the descending and sigmoid colon consistent with an infectious or inflammatory colitis. 2. There is also a short segment  of narrowing and possible wall thickening of the superior ascending colon which could reflect a constricting mass or, more likely, be an area of persistent colonic spasm. This could be further assessed with either colonoscopy or a barium enema after the current symptoms of colitis have resolved. 3. Small, subcentimeter, low-density liver lesions which may all be benign. However, 3 these are not evident on prior CT.  Liver metastatic disease possible. These could be further assessed with liver MRI with and without contrast. 4. Adrenal lesions which are stable, on the right and myelolipoma and on the left most likely an adenoma.      06/07/2016 - 06/09/2016 Hospital Admission    Rectal Bleeding       CURRENT THERAPY: Docetaxel, Carboplatin, Herceptin and pejeta (TCHP) every 3 weeks, for total of 6 cycles, followed by Herceptin and perjeta maintenance therapy to complete 1 year treatment starting 05/11/16; Docetaxel changed to Taxol, carbo dose reduced to AUC 4.5 (from 5)  from cycle 2 due to severe diarrhea and cytopenia, on 06/01/16. Will hold Perjeta for cycle 3  INTERVAL HISTORY:   Brandi Dickson returns as scheduled. She completed cycle 3 Taxol/carboplatin/Herceptin 06/21/2016. She denies nausea/vomiting. No mouth sores. Bowel habits prior to chemotherapy were at baseline with approximately 2 loose stools a day. For the past 2 days she has had increased loose stools which she thinks is diet related and not related to chemotherapy. She is taking Imodium and Lomotil with improvement. She has stable numbness in the feet which predated chemotherapy. She has mild numbness in the fingertips. No rash. She was seen by her PCP earlier this week with shortness of breath and wheezing. Chest x-ray was negative. Prednisone dose was increased. She reports her breathing is much better.  Objective:  Vital signs in last 24 hours:  Blood pressure 128/78, pulse (!) 108, temperature 98.1 F (36.7 C), temperature source Oral, resp. rate 19, height 5' 4.75" (1.645 m), weight 194 lb 11.2 oz (88.3 kg), SpO2 99 %.    HEENT: No thrush or ulcers. Mouth is mildly dry appearing. Resp: Lungs clear bilaterally. Cardio: Regular rate and rhythm. GI: Abdomen soft with mild generalized tenderness. No hepatomegaly. Vascular: No leg edema. Neuro: Alert and oriented.  Skin: No rash.  Port-A-Cath without erythema.  Lab  Results:  Lab Results  Component Value Date   WBC 14.3 (H) 06/29/2016   HGB 12.3 06/29/2016   HCT 37.2 06/29/2016   MCV 89.6 06/29/2016   PLT 183 06/29/2016   NEUTROABS 11.1 (H) 06/29/2016    Imaging:  No results found.  Medications: I have reviewed the patient's current medications.  Assessment/Plan: 1. Breast cancer of upper-outer quadrant of left breast, invasive ductal carcinoma,  stage IIA (cT2N0M0) grade 3, ER-, PR-, HER2 amplified; cycle 1 TCHP 05/11/2016 (severe diarrhea and cytopenia with hospitalization); with cycle 2 06/01/2016 she received Taxol in place of Taxotere, carbo and perjeta dose reduced (had worsening diarrhea and bleeding with hospitalization); cycle 3 Taxol/carboplatin/Herceptin 06/21/2016 2. Genetics. Referred to genetic counseling due to strong family history of breast cancer. 3. Type 2 diabetes, hypertension managed by PCP. 4. Ulcerative colitis followed by Dr. Fuller Plan. 5. Arthritis status post bilateral hip replacement, last surgery February 2018.   Disposition: Brandi Dickson completed cycle 3 Taxol/carboplatin/Herceptin 06/21/2016. She developed worsening diarrhea from baseline 2 days ago. She feels this is most likely diet related rather than related to chemotherapy. She will continue Lomotil and Imodium as needed. She will receive IV fluids today. She understands to contact  the office if the diarrhea does not improve over the next several days.  She will return for a follow-up visit and cycle 4 on 07/13/2016. She will contact the office in the interim as outlined above or with any other problems.  Plan reviewed with [name].  Ned Card ANP/GNP-BC   06/29/2016  2:44 PM

## 2016-07-05 ENCOUNTER — Telehealth: Payer: Self-pay | Admitting: Hematology

## 2016-07-05 NOTE — Telephone Encounter (Signed)
Patient bypassed scheduling on 06/29/16.  Per Dr Burr Medico, double book patient on 07/13/16 @ 9 a.m. Lab, flush and follow up appointments added to existing Chemo appointments on 07/13/16 and 08/03/16. Left message on patien'ts voicemail with appointment details. An appointment letter and schedule was mailed to patient, per 06/29/16 los.

## 2016-07-06 ENCOUNTER — Ambulatory Visit (HOSPITAL_COMMUNITY)
Admission: RE | Admit: 2016-07-06 | Discharge: 2016-07-06 | Disposition: A | Discharge status: Home / Self Care | Payer: 59 | Source: Ambulatory Visit | Attending: Hematology | Admitting: Hematology

## 2016-07-06 ENCOUNTER — Encounter (HOSPITAL_COMMUNITY): Payer: Self-pay

## 2016-07-06 DIAGNOSIS — Z171 Estrogen receptor negative status [ER-]: Principal | ICD-10-CM

## 2016-07-06 DIAGNOSIS — C50412 Malignant neoplasm of upper-outer quadrant of left female breast: Secondary | ICD-10-CM

## 2016-07-06 MED ORDER — GADOBENATE DIMEGLUMINE 529 MG/ML IV SOLN: 20.0000 mL | Freq: Once | INTRAVENOUS | Status: DC | PRN

## 2016-07-06 NOTE — Progress Notes (Signed)
[place]  [phone] Fax:([phone]  Clinic Follow Up Note   Patient Care Team: Orpah Melter, MD as PCP - General (Family Medicine) [name], MD as Consulting Physician (General Surgery) Truitt Merle, MD as Consulting Physician (Hematology) Gery Pray, MD as Consulting Physician (Radiation Oncology) Ladene Artist, MD as Consulting Physician (Gastroenterology) 07/13/2016  CHIEF COMPLAINTS:  Follow up left breast cancer  Oncology History   Cancer Staging Breast cancer of upper-outer quadrant of left female breast [facility]) Staging form: Breast, AJCC 8th Edition - Clinical stage from 04/25/2016: Stage IIA (cT2, cN0, cM0, G3, ER: Negative, PR: Negative, HER2: Positive) - Signed by Truitt Merle, MD on 05/02/2016       Breast cancer of upper-outer quadrant of left female breast ([place])   04/24/2016 Mammogram    Category B breasts with a new irregular mass in the UOQ left breast middle depth. Ultrasound revealed a 3.9 cm mass in the 1:00 position. The left axilla was negative.       04/25/2016 Initial Biopsy    Biopsy of the left breast showed grade 3 invasive ductal carcinoma, DCIS, and lymphovascular invasion was present.      04/25/2016 Receptors her2    ER 0% negative, PR 0% negative, HER2 positive, Ki67 30%      05/02/2016 Initial Diagnosis    Breast cancer of upper-outer quadrant of left female breast ([place])     05/07/2016 Imaging    MRI of the bilateral breast 05/07/16 IMPRESSION: Lobulated enhancing mass (4.0 x 2.7 x 3.1 cm) in the upper-outer quadrant of the left breast corresponding with the recently diagnosed invasive mammary carcinoma. Linear enhancement extends 2.6 cm posterior to the mass worrisome for ductal carcinoma in-situ.      05/09/2016 Echocardiogram    Echo 05/09/16 -LF EF: 55-60%      05/11/2016 -  Neo-Adjuvant Chemotherapy    Docetaxel, Carboplatin, Herceptin and pejeta (TCHP) every 3 weeks, for total of 6 cycles,  followed by Herceptin and perjeta maintenance therapy to complete 1 year treatment   starting 05/11/16; Docetaxel changed to Taxol, carbo dose reduced to AUC 4.5 (from 5)  from cycle 2 due to severe diarrhea and cytopenia, on 06/01/16. Will hold Perjeta and add herceptin for cycle 3       05/13/2016 Imaging    CT Angio Chest PE IMPRESSION: No evidence of pulmonary emboli. Left breast mass consistent with the given clinical history. Stable left adrenal lesion likely representing a small adenoma.      05/18/2016 - 05/24/2016 Hospital Admission    Patient presented with nausea, vomiting, and diarrhea; admitted to hospital with Hyponatremia      06/07/2016 - 06/09/2016 Hospital Admission    Patient presents to hospital complaints of rectal bleeding and abdominal cramps when defacating      06/07/2016 Imaging    CT ABDOMEN PELVIS W CONTRAST  IMPRESSION: 1. Wall thickening of the descending and sigmoid colon consistent with an infectious or inflammatory colitis. 2. There is also a short segment of narrowing and possible wall thickening of the superior ascending colon which could reflect a constricting mass or, more likely, be an area of persistent colonic spasm. This could be further assessed with either colonoscopy or a barium enema after the current symptoms of colitis have resolved. 3. Small, subcentimeter, low-density liver lesions which may all be benign. However, 3 these are not evident on prior CT. Liver metastatic disease possible. These could be further assessed with liver MRI with and without  contrast. 4. Adrenal lesions which are stable, on the right and myelolipoma and on the left most likely an adenoma.      06/07/2016 Imaging    CT A/P IMPRESSION: 1. Wall thickening of the descending and sigmoid colon consistent with an infectious or inflammatory colitis. 2. There is also a short segment of narrowing and possible wall thickening of the superior ascending colon which could  reflect a constricting mass or, more likely, be an area of persistent colonic spasm. This could be further assessed with either colonoscopy or a barium enema after the current symptoms of colitis have resolved. 3. Small, subcentimeter, low-density liver lesions which may all be benign. However, 3 these are not evident on prior CT. Liver metastatic disease possible. These could be further assessed with liver MRI with and without contrast. 4. Adrenal lesions which are stable, on the right and myelolipoma and on the left most likely an adenoma.      06/07/2016 - 06/09/2016 Hospital Admission    Rectal Bleeding       HISTORY OF PRESENTING ILLNESS (05/02/16):  Brandi Dickson 56 y.o. female is here because of a new diagnosis of left breast cancer. She is accompanied by her husband to our multidisciplinary breast clinic today.  The patient presented with a palpable left breast lump approximately 2 weeks ago. Bilateral diagnostic mammogram on 04/24/16 showed Category B breasts with a new irregular mass in the UOQ left breast middle depth. Ultrasound performed on 04/24/16 revealed a 3.9 cm mass in the 1:00 position. The left axilla was negative.  Biopsy of the left breast on 04/25/16 showed grade 3 invasive ductal carcinoma, DCIS, and lymphovascular invasion was present (ER 0% negative, PR 0% negative, HER2 positive, Ki67 30%).  She denies tenderness of the biopsied area. Reports minor dimpling. The patient is 8 weeks out from a left hip replacement. She had a right hip replacement 2010 years ago.  The patient and her husband present today in multidisciplinary breast clinic to discuss treatment options for the management of her disease.  The patient is taking Neurontin for neuropathy in her feet from diabetes. She has been diagnosed for diabetes for the past 4 years. She states she only has neuropathy at night and take 2 Neurontin at night and then has no symptoms.  GYN HISTORY  Menarchal:  9 LMP: Complete hysterectomy for uterine fibroids ~ 2009. She was bleeding too much with her last menstrual cycle lasting 6 weeks. No issues with menopause. Contraceptive: no  HRT: No GP: G1P0  CURRENT THERAPY: Docetaxel, Carboplatin, Herceptin and pejeta (TCHP) every 3 weeks, for total of 6 cycles, followed by Herceptin and perjeta maintenance therapy to complete 1 year treatment starting 05/11/16; Docetaxel changed to Taxol, carbo dose reduced to AUC 4.5 (from 5)  from cycle 2 due to severe diarrhea and cytopenia, on 06/01/16. Will hold Perjeta from cycle 3  INTERVAL HISTORY: Brandi Dickson returns for follow up accompanied by her husband. She is doing well. Denies cough, swelling or chest discomfort. She did much better her last cycle. Her bowel movements have also improved. Her taste has been effected due to chemo. She is trying to find foods that taste better to her. She does self breast exams at home.    MEDICAL HISTORY:  Past Medical History:  Diagnosis Date  . Arthritis   . Asthma    triggered with [name] perfumes and cigarette smoke  . Cancer ([place])   . Colitis   . Diabetes mellitus  without complication ([place])   . Hypertension   . Neuropathy     SURGICAL HISTORY: Past Surgical History:  Procedure Laterality Date  . ABDOMINAL HYSTERECTOMY    . DILATION AND CURETTAGE OF UTERUS    . PORTACATH PLACEMENT Right 05/08/2016   Procedure: INSERTION PORT-A-CATH WITH [place];  Surgeon: [name], MD;  Location: [facility];  Service: General;  Laterality: Right;  . TONSILLECTOMY    . TOTAL HIP ARTHROPLASTY Right   . TOTAL HIP ARTHROPLASTY Left 03/06/2016   Procedure: LEFT TOTAL HIP ARTHROPLASTY ANTERIOR APPROACH;  Surgeon: [name], MD;  Location: [facility];  Service: Orthopedics;  Laterality: Left;    SOCIAL HISTORY: Social History   Social History  . Marital status: Married    Spouse name: N/A  . Number of children: N/A  . Years of education: N/A    Occupational History  . Not on file.   Social History Main Topics  . Smoking status: Former Smoker    Packs/day: 1.00    Years: 29.00    Types: Cigarettes    Quit date: 10/27/2002  . Smokeless tobacco: Never Used  . Alcohol use No  . Drug use: No  . Sexual activity: Yes    Birth control/ protection: Surgical   Other Topics Concern  . Not on file   Social History Narrative  . No narrative on file    FAMILY HISTORY: Family History  Problem Relation Age of Onset  . Colon cancer Father   . Stomach cancer Paternal Uncle   . Stomach cancer Paternal Uncle   . Melanoma Brother   . Thyroid cancer Brother   . Breast cancer Maternal Aunt   . Breast cancer Maternal Aunt   . Breast cancer Cousin     ALLERGIES:  is allergic to lisinopril; augmentin [amoxicillin-pot clavulanate]; and benadryl [diphenhydramine].  MEDICATIONS:  Current Outpatient Prescriptions  Medication Sig Dispense Refill  . albuterol (PROVENTIL HFA;VENTOLIN HFA) 108 (90 Base) MCG/ACT inhaler Inhale 1-2 puffs into the lungs every 6 (six) hours as needed for wheezing or shortness of breath.    . dicyclomine (BENTYL) 20 MG tablet Take 1 tablet (20 mg total) by mouth 4 (four) times daily -  before meals and at bedtime. 30 tablet 0  . ferrous sulfate (FERROUSUL) 325 (65 FE) MG tablet Take 1 tablet (325 mg total) by mouth 3 (three) times daily with meals. (Patient taking differently: Take 325 mg by mouth 2 (two) times daily with a meal. )    . gabapentin (NEURONTIN) 100 MG capsule Take 200 mg by mouth at bedtime.     . lidocaine-prilocaine (EMLA) cream Apply 1 application to skin 1.5 to 2 hrs before use.  Cover to secure cream with plastic wrap. 30 g PRN  . LORazepam (ATIVAN) 0.5 MG tablet Take 1 tablet (0.5 mg total) by mouth once as needed for anxiety. 2 tablet 0  . magnesium oxide (MAG-OX) 400 MG tablet Take 400 mg by mouth 2 (two) times daily.    . Mesalamine (ASACOL HD) 800 MG TBEC Take 2 tablets (1,600 mg total)  by mouth 3 (three) times daily. 90 tablet 5  . metFORMIN (GLUCOPHAGE-XR) 500 MG 24 hr tablet Take 1,000 mg by mouth at bedtime.    . metoprolol (LOPRESSOR) 50 MG tablet Take 50 mg by mouth at bedtime.     . ondansetron (ZOFRAN) 8 MG tablet Take 1 tablet (8 mg total) by mouth 2 (two) times daily as needed for refractory nausea /  vomiting. Start on day 3 after chemo. 30 tablet 1  . Potassium 99 MG TABS Take 1 tablet by mouth 2 (two) times daily.    . predniSONE (DELTASONE) 20 MG tablet Take 30 mg by mouth daily.     . prochlorperazine (COMPAZINE) 10 MG tablet Take 1 tablet (10 mg total) by mouth every 6 (six) hours as needed (Nausea or vomiting). 30 tablet 1  . Specialty Vitamins Products (VITAMINS FOR THE HAIR) TABS Take 2 tablets by mouth 2 (two) times daily.    . traMADol (ULTRAM) 50 MG tablet Take 1-2 tablets (50-100 mg total) by mouth every 6 (six) hours as needed. 40 tablet 0  . triamterene-hydrochlorothiazide (MAXZIDE) 75-50 MG per tablet Take 0.5 tablets by mouth daily.     [name] Kitchen dexamethasone (DECADRON) 4 MG tablet Take 1 tablet (4 mg total) by mouth daily. Start the day before Taxotere. Then again the day after chemo for 3 days. (Patient not taking: Reported on 07/13/2016) 30 tablet 1  . diphenoxylate-atropine (LOMOTIL) 2.5-0.025 MG tablet Take one to two tablets every six hours as needed for diarrhea (Patient not taking: Reported on 07/13/2016) 120 tablet 1  . hydrocortisone (ANUSOL-HC) 25 MG suppository Place 1 suppository (25 mg total) rectally 2 (two) times daily. (Patient not taking: Reported on 07/13/2016) 12 suppository 0  . loperamide (IMODIUM) 1 MG/5ML solution Take 3 mg by mouth as needed for diarrhea or loose stools.    . niacin 250 MG tablet Take 250 mg by mouth at bedtime.      No current facility-administered medications for this visit.    Facility-Administered Medications Ordered in Other Visits  Medication Dose Route Frequency Provider Last Rate Last Dose  . heparin lock flush  100 unit/mL  500 Units Intracatheter Once PRN Truitt Merle, MD      . sodium chloride flush (NS) 0.9 % injection 10 mL  10 mL Intracatheter PRN Truitt Merle, MD        REVIEW OF SYSTEMS:   Constitutional: Denies fevers, chills or abnormal night sweats Eyes: Denies blurriness of vision, double vision or watery eyes Ears, nose, mouth, throat, and face: Denies mucositis or sore throat Respiratory: Denies cough, dyspnea or wheezes Cardiovascular: Denies palpitation, chest discomfort or lower extremity swelling Gastrointestinal:  Denies nausea, heartburn (+) diarrhea improved,  Skin: Denies abnormal skin rashes Lymphatics: Denies new lymphadenopathy or easy bruising Neurological:neuropathy.  Behavioral/Psych: Mood is stable, no new changes  All other systems were reviewed with the patient and are negative.  PHYSICAL EXAMINATION:  ECOG PERFORMANCE STATUS: 1 - Symptomatic but completely ambulatory  Vitals:   07/13/16 0920  BP: 116/73  Pulse: 81  Resp: 18  Temp: 98.7 F (37.1 C)   Filed Weights   07/13/16 0920  Weight: 198 lb 4.8 oz (89.9 kg)    GENERAL:alert, no distress and comfortable SKIN: skin color, texture, turgor are normal, no rashes or significant lesions EYES: normal, conjunctiva are pink and non-injected, sclera clear OROPHARYNX:no exudate, no erythema and lips, buccal mucosa, and tongue normal  NECK: supple, thyroid normal size, non-tender, without nodularity LYMPH:  no palpable lymphadenopathy in the cervical, axillary or inguinal LUNGS: clear to auscultation and percussion with normal breathing effort HEART: regular rate & rhythm and no murmurs and no lower extremity edema ABDOMEN:abdomen soft, non-tender and normal bowel sounds Musculoskeletal:no cyanosis of digits and no clubbing  PSYCH: alert & oriented x 3 with fluent speech NEURO: no focal motor/sensory deficits BREAST: Breast inspection showed them to be symmetrical with  no nipple discharge. Left breast exam showed  UOQ 2 x 2.5 cm reduced to 2 x 1.5 cm palpable mass, non tender, and smaller in size than previous exam. No palpable mass in the right breast and bilateral axilla.  LABORATORY DATA:  I have reviewed the data as listed CBC Latest Ref Rng & Units 07/13/2016 06/29/2016 06/21/2016  WBC 3.9 - 10.3 10e3/uL 13.6(H) 14.3(H) 11.0(H)  Hemoglobin 11.6 - 15.9 g/dL 11.2(L) 12.3 11.4(L)  Hematocrit 34.8 - 46.6 % 34.5(L) 37.2 34.5(L)  Platelets 145 - 400 10e3/uL 202 183 263   CMP Latest Ref Rng & Units 07/13/2016 06/29/2016 06/21/2016  Glucose 70 - 140 mg/dl 135 216(H) 177(H)  BUN 7.0 - 26.0 mg/dL 22.3 28.3(H) 15.1  Creatinine 0.6 - 1.1 mg/dL 0.9 1.2(H) 0.8  Sodium 136 - 145 mEq/L 144 135(L) 141  Potassium 3.5 - 5.1 mEq/L 3.0(LL) 3.8 3.6  Chloride 101 - 111 mmol/L - - -  CO2 22 - 29 mEq/L 30(H) 25 24  Calcium 8.4 - 10.4 mg/dL 9.7 9.9 9.0  Total Protein 6.4 - 8.3 g/dL 6.4 7.1 6.3(L)  Total Bilirubin 0.20 - 1.20 mg/dL 0.27 0.29 0.26  Alkaline Phos 40 - 150 U/L 97 124 94  AST 5 - 34 U/L _0 ALT 0 - 55 U/L _1 PATHOLOGY REPORT:   ADDITIONAL INFORMATION: 04/25/16 PROGNOSTIC INDICATORS Results: IMMUNOHISTOCHEMICAL AND MORPHOMETRIC ANALYSIS PERFORMED MANUALLY Estrogen Receptor: 0%, NEGATIVE Progesterone Receptor: 0%, NEGATIVE Proliferation Marker Ki67: 30% COMMENT: The negative hormone receptor study(ies) in this case has no internal positive control. REFERENCE RANGE ESTROGEN RECEPTOR NEGATIVE 0% POSITIVE =>1% REFERENCE RANGE PROGESTERONE RECEPTOR NEGATIVE 0% POSITIVE =>1% All controls stained appropriately Enid Cutter MD Pathologist, Electronic Signature ( Signed 05/01/2016) FLUORESCENCE IN-SITU HYBRIDIZATION Results: HER2 - **POSITIVE** RATIO OF HER2/CEP17 SIGNALS 2.25 AVERAGE HER2 COPY NUMBER PER CELL 8.45 1 of 3 FINAL for Roan, Eleni P ([id number]) ADDITIONAL INFORMATION:(continued) Reference Range: NEGATIVE HER2/CEP17 Ratio <2.0 and average HER2 copy number  <4.0 EQUIVOCAL HER2/CEP17 Ratio <2.0 and average HER2 copy number 4.0 and <6.0 POSITIVE HER2/CEP17 Ratio >=2.0 or <2.0 and average HER2 copy number >=6.0 Enid Cutter MD Pathologist, Electronic Signature ( Signed 04/30/2016) FINAL DIAGNOSIS Diagnosis Breast, left, needle core biopsy - INVASIVE DUCTAL CARCINOMA, SEE COMMENT. - DUCTAL CARCINOMA IN SITU. - LYMPHOVASCULAR INVASION PRESENT. Microscopic Comment The carcinoma appears grade 3 with focal squamous differentiation. Prognostic markers will be ordered. Dr. Lyndon Code has reviewed the case. The case was called to Dr. Isaiah Blakes on 04/26/2016. Vicente Males MD Pathologist, Electronic Signature (Case signed 04/26/2016)  RADIOGRAPHIC STUDIES: I have personally reviewed the radiological images as listed and agreed with the findings in the report.  See onc history CT ABD/Pelvi 5/10/18s W CONTRAST 06/07/16 IMPRESSION: 1. Wall thickening of the descending and sigmoid colon consistent with an infectious or inflammatory colitis. 2. There is also a short segment of narrowing and possible wall thickening of the superior ascending colon which could reflect a constricting mass or, more likely, be an area of persistent colonic spasm. This could be further assessed with either colonoscopy or a barium enema after the current symptoms of colitis have resolved. 3. Small, subcentimeter, low-density liver lesions which may all be benign. However, 3 these are not evident on prior CT. Liver metastatic disease possible. These could be further assessed with liver MRI with and without contrast. 4. Adrenal lesions which are stable, on the right and myelolipoma and on the left most likely an adenoma.  CT Angio Chest PE  W and/or wo Contrast 05/13/16 IMPRESSION: No evidence of pulmonary emboli.  Echo 05/09/16 Study Conclusions -LF EF: 55-60% - Left ventricle: The cavity size was normal. There was mild focal   basal hypertrophy of the septum. Indeterminant  diastolic   function. Systolic function was normal. The estimated ejection   fraction was in the range of 55% to 60%. Wall motion was normal;   there were no regional wall motion abnormalities. GLS abnormal at   -13.3%, poor images however.  ASSESSMENT & PLAN: 56 y.o. post-menopausal Caucasian female with a self palpated left breast mass.  1. Breast cancer of upper-outer quadrant of left breast, invasive ductal carcinoma,  stage IIA (cT2N0M0) grade 3, ER-, PR-, HER2 amplified -We previously reviewed the patient's imaging and pathology. -We previously reviewed her staging and biology of her breast cancer  -We previously discussed that surgical resection is the definitive treatment for breast cancer, she was seen by breast surgeon Dr. Lucia Gaskins today, lumpectomy versus mastectomy were discussed with patient. -We previously discussed HER2 positive breast cancers total approximately 15% of breast cancers and happens to be more aggressive than HER2 negative cancers, especially ER and PR negative disease, she has high likelihood of cancer recurrence after complete surgical resection.  -I recommend neoadjuvant chemotherapy TCHP (docetaxel, carboplatin, Herceptin and pejeta) every 3 weeks for 6 cycles, followed by maintenance Herceptin and pejeta to complete 1 year therapy -Her baseline echo was normal, she was seen by cardiologist Dr. Benjamine Mola -She unfortunately developed severe diarrhea and cytopenia after first cycle chemotherapy, was hospitalized for a week. She has now recovered well. -cycle 2 chemo docetaxel was changed to paclitaxel, carbo and pejeta dose was reduced, unfortunately she again developed worsening diarrhea and bleeding, required a few days hospitalization. -Perjeta has been held from cycle 3, she tolerated cycle 3 with carboplatin, paclitaxel and Herceptin well -She has recovered well from chemotherapy, diarrhea much improved, lab reviewed, adequate for treatment, we'll proceed to cycle 4  carpal, Taxol and Herceptin today. -She is scheduled for abdominal MRI to further evaluate her liver lesion next week, I'll review her scan findings on her next visit in 3 weeks. - If she needs more fluids, she will call the clinic  2. Genetics -Given her strong family history of breast cancer, we recommend her to see genetic counseling to ruled out inheritable breast cancer syndrome. She agreed. -Genetic counseling scheduled 05/17/16.  3. Type 2 Diabetes mellitus, HTN -Managed by her PCP. -The patient has peripheral neuropathy in her feet from her diabetes. The patient is already on Neurontin with 200 mg at night. We discussed that chemotherapy may make her neuropathy worse. -Steroids will be given to reduce chemo side effects and I will reduce dexa to 36m daily to not affect her blood sugar much. -We'll monitor her blood glucose and blood pressure closely during her chemotherapy treatment.  4. Ulcerative colitis -We previously discussed that chemotherapy would cause diarrhea and the patient's colitis may exacerbate during chemo  -She is not taking steroids for this. She is on mesalamine, will follow up with Dr. SFuller Plan -She had severe diarrhea after first cycle chemotherapy, was treated for ulcerative colitis flare, she will follow-up with Dr. SFuller Plannext week. -I previously advised the patient to keep herself adequatly hydrated and to take Imodium PRN. - I again previously advised the patient to take Lomotil and Imodium frequently as needed.  5. Arthritis  -s/p b/l hip replacement, last surgery in February 2018.   PLAN -Lab review, adequate for  treatment, we'll proceed cycle 4 carboplatin, paclitaxel, and Herceptin, well continue to hold pejeta  - f/u and treatment 7/6 - 6/18 and 7/9 for Neulasta injection -she is scheduled for abdominal MRI next week, I'll review the findings on her next visit on July 6   No orders of the defined types were placed in this encounter.   All questions  were answered. The patient knows to call the clinic with any problems, questions or concerns.  I spent 20 minutes counseling the patient face to face. The total time spent in the appointment was 25 minutes and more than 50% was on counseling.  This document serves as a record of services personally performed by Truitt Merle, MD. It was created on her behalf by Brandt Loosen, a trained medical scribe. The creation of this record is based on the scribe's personal observations and the provider's statements to them. This document has been checked and approved by the attending provider.     Truitt Merle, MD 07/13/2016

## 2016-07-06 NOTE — Progress Notes (Signed)
Attempted pt 07/06/2016  Pt unable to tolerate exam due to severe claustro  Pt requesting to be scanned in open/wide bore scanner and stated she would call Dr Burr Medico to get scheduled bhj

## 2016-07-13 ENCOUNTER — Other Ambulatory Visit (HOSPITAL_BASED_OUTPATIENT_CLINIC_OR_DEPARTMENT_OTHER): Payer: 59

## 2016-07-13 ENCOUNTER — Ambulatory Visit: Payer: 59

## 2016-07-13 ENCOUNTER — Ambulatory Visit (HOSPITAL_BASED_OUTPATIENT_CLINIC_OR_DEPARTMENT_OTHER): Payer: 59 | Admitting: Hematology

## 2016-07-13 ENCOUNTER — Encounter: Payer: Self-pay | Admitting: *Deleted

## 2016-07-13 ENCOUNTER — Ambulatory Visit (HOSPITAL_BASED_OUTPATIENT_CLINIC_OR_DEPARTMENT_OTHER): Payer: 59

## 2016-07-13 VITALS — BP 116/73 | HR 81 | Temp 98.7°F | Resp 18 | Ht 64.0 in | Wt 198.3 lb

## 2016-07-13 DIAGNOSIS — Z171 Estrogen receptor negative status [ER-]: Secondary | ICD-10-CM

## 2016-07-13 DIAGNOSIS — Z5111 Encounter for antineoplastic chemotherapy: Secondary | ICD-10-CM | POA: Diagnosis not present

## 2016-07-13 DIAGNOSIS — Z95828 Presence of other vascular implants and grafts: Secondary | ICD-10-CM

## 2016-07-13 DIAGNOSIS — Z5112 Encounter for antineoplastic immunotherapy: Secondary | ICD-10-CM | POA: Diagnosis not present

## 2016-07-13 DIAGNOSIS — C50412 Malignant neoplasm of upper-outer quadrant of left female breast: Secondary | ICD-10-CM

## 2016-07-13 DIAGNOSIS — E1140 Type 2 diabetes mellitus with diabetic neuropathy, unspecified: Secondary | ICD-10-CM | POA: Diagnosis not present

## 2016-07-13 DIAGNOSIS — K5190 Ulcerative colitis, unspecified, without complications: Secondary | ICD-10-CM | POA: Diagnosis not present

## 2016-07-13 DIAGNOSIS — E119 Type 2 diabetes mellitus without complications: Secondary | ICD-10-CM

## 2016-07-13 DIAGNOSIS — I10 Essential (primary) hypertension: Secondary | ICD-10-CM

## 2016-07-13 LAB — COMPREHENSIVE METABOLIC PANEL
ALK PHOS: 97 U/L (ref 40–150)
ALT: 23 U/L (ref 0–55)
AST: 12 U/L (ref 5–34)
Albumin: 3.2 g/dL — ABNORMAL LOW (ref 3.5–5.0)
Anion Gap: 13 mEq/L — ABNORMAL HIGH (ref 3–11)
BUN: 22.3 mg/dL (ref 7.0–26.0)
CO2: 30 meq/L — AB (ref 22–29)
Calcium: 9.7 mg/dL (ref 8.4–10.4)
Chloride: 100 mEq/L (ref 98–109)
Creatinine: 0.9 mg/dL (ref 0.6–1.1)
EGFR: 74 mL/min/{1.73_m2} — AB (ref >90)
GLUCOSE: 135 mg/dL (ref 70–140)
POTASSIUM: 3 meq/L — AB (ref 3.5–5.1)
SODIUM: 144 meq/L (ref 136–145)
Total Bilirubin: 0.27 mg/dL (ref 0.20–1.20)
Total Protein: 6.4 g/dL (ref 6.4–8.3)

## 2016-07-13 LAB — CBC WITH DIFFERENTIAL/PLATELET
BASO%: 0.4 % (ref 0.0–2.0)
BASOS ABS: 0.1 10*3/uL (ref 0.0–0.1)
EOS ABS: 0 10*3/uL (ref 0.0–0.5)
EOS%: 0 % (ref 0.0–7.0)
HCT: 34.5 % — ABNORMAL LOW (ref 34.8–46.6)
HGB: 11.2 g/dL — ABNORMAL LOW (ref 11.6–15.9)
LYMPH%: 33.9 % (ref 14.0–49.7)
MCH: 30.5 pg (ref 25.1–34.0)
MCHC: 32.6 g/dL (ref 31.5–36.0)
MCV: 93.5 fL (ref 79.5–101.0)
MONO#: 1.2 10*3/uL — ABNORMAL HIGH (ref 0.1–0.9)
MONO%: 8.9 % (ref 0.0–14.0)
NEUT#: 7.7 10*3/uL — ABNORMAL HIGH (ref 1.5–6.5)
NEUT%: 56.8 % (ref 38.4–76.8)
Platelets: 202 10*3/uL (ref 145–400)
RBC: 3.69 10*6/uL — AB (ref 3.70–5.45)
RDW: 20.5 % — ABNORMAL HIGH (ref 11.2–14.5)
WBC: 13.6 10*3/uL — ABNORMAL HIGH (ref 3.9–10.3)
lymph#: 4.6 10*3/uL — ABNORMAL HIGH (ref 0.9–3.3)

## 2016-07-13 MED ORDER — DEXAMETHASONE SODIUM PHOSPHATE 10 MG/ML IJ SOLN
INTRAMUSCULAR | Status: AC
  Filled 2016-07-13: qty 1

## 2016-07-13 MED ORDER — SODIUM CHLORIDE 0.9% FLUSH
10.0000 mL | INTRAVENOUS | Status: DC | PRN
  Administered 2016-07-13: 10 mL
  Filled 2016-07-13: qty 10

## 2016-07-13 MED ORDER — DIPHENHYDRAMINE HCL 25 MG PO CAPS: 50.0000 mg | ORAL_CAPSULE | Freq: Once | ORAL | Status: DC

## 2016-07-13 MED ORDER — LORATADINE 10 MG PO TABS
10.0000 mg | ORAL_TABLET | Freq: Once | ORAL | Status: AC
  Administered 2016-07-13: 10 mg via ORAL
  Filled 2016-07-13: qty 1

## 2016-07-13 MED ORDER — PALONOSETRON HCL INJECTION 0.25 MG/5ML
0.2500 mg | Freq: Once | INTRAVENOUS | Status: AC
  Administered 2016-07-13: 0.25 mg via INTRAVENOUS

## 2016-07-13 MED ORDER — PALONOSETRON HCL INJECTION 0.25 MG/5ML
INTRAVENOUS | Status: AC
  Filled 2016-07-13: qty 5

## 2016-07-13 MED ORDER — ACETAMINOPHEN 325 MG PO TABS
650.0000 mg | ORAL_TABLET | Freq: Once | ORAL | Status: AC
  Administered 2016-07-13: 650 mg via ORAL

## 2016-07-13 MED ORDER — FAMOTIDINE IN NACL 20-0.9 MG/50ML-% IV SOLN
20.0000 mg | Freq: Once | INTRAVENOUS | Status: AC
  Administered 2016-07-13: 20 mg via INTRAVENOUS

## 2016-07-13 MED ORDER — HEPARIN SOD (PORK) LOCK FLUSH 100 UNIT/ML IV SOLN
500.0000 [IU] | Freq: Once | INTRAVENOUS | Status: AC | PRN
  Administered 2016-07-13: 500 [IU]
  Filled 2016-07-13: qty 5

## 2016-07-13 MED ORDER — SODIUM CHLORIDE 0.9% FLUSH
10.0000 mL | Freq: Once | INTRAVENOUS | Status: AC
  Administered 2016-07-13: 10 mL
  Filled 2016-07-13: qty 10

## 2016-07-13 MED ORDER — TRASTUZUMAB CHEMO 150 MG IV SOLR
6.0000 mg/kg | Freq: Once | INTRAVENOUS | Status: AC
  Administered 2016-07-13: 588 mg via INTRAVENOUS
  Filled 2016-07-13: qty 28

## 2016-07-13 MED ORDER — PACLITAXEL CHEMO INJECTION 300 MG/50ML
175.0000 mg/m2 | Freq: Once | INTRAVENOUS | Status: AC
  Administered 2016-07-13: 372 mg via INTRAVENOUS
  Filled 2016-07-13: qty 62

## 2016-07-13 MED ORDER — DEXAMETHASONE SODIUM PHOSPHATE 10 MG/ML IJ SOLN
10.0000 mg | Freq: Once | INTRAMUSCULAR | Status: AC
  Administered 2016-07-13: 10 mg via INTRAVENOUS

## 2016-07-13 MED ORDER — SODIUM CHLORIDE 0.9 % IV SOLN
654.0000 mg | Freq: Once | INTRAVENOUS | Status: AC
  Administered 2016-07-13: 650 mg via INTRAVENOUS
  Filled 2016-07-13: qty 65

## 2016-07-13 MED ORDER — ACETAMINOPHEN 325 MG PO TABS
ORAL_TABLET | ORAL | Status: AC
  Filled 2016-07-13: qty 2

## 2016-07-13 MED ORDER — SODIUM CHLORIDE 0.9 % IV SOLN
Freq: Once | INTRAVENOUS | Status: AC
  Administered 2016-07-13: 10:00:00 via INTRAVENOUS

## 2016-07-13 MED ORDER — FAMOTIDINE IN NACL 20-0.9 MG/50ML-% IV SOLN
INTRAVENOUS | Status: AC
  Filled 2016-07-13: qty 50

## 2016-07-13 NOTE — Patient Instructions (Signed)
[facility] Discharge Instructions for Patients Receiving Chemotherapy  Today you received the following chemotherapy agents Herceptin, Taxol and Carboplatin   To help prevent nausea and vomiting after your treatment, we encourage you to take your nausea medication as directed. No Zofran for 3 days. Take Compazine instead.    If you develop nausea and vomiting that is not controlled by your nausea medication, call the clinic.   BELOW ARE SYMPTOMS THAT SHOULD BE REPORTED IMMEDIATELY:  *FEVER GREATER THAN 100.5 F  *CHILLS WITH OR WITHOUT FEVER  NAUSEA AND VOMITING THAT IS NOT CONTROLLED WITH YOUR NAUSEA MEDICATION  *UNUSUAL SHORTNESS OF BREATH  *UNUSUAL BRUISING OR BLEEDING  TENDERNESS IN MOUTH AND THROAT WITH OR WITHOUT PRESENCE OF ULCERS  *URINARY PROBLEMS  *BOWEL PROBLEMS  UNUSUAL RASH Items with * indicate a potential emergency and should be followed up as soon as possible.  Feel free to call the clinic you have any questions or concerns. The clinic phone number is [phone].  Please show the [facility] at check-in to the Emergency Department and triage nurse.

## 2016-07-13 NOTE — Patient Instructions (Signed)
Implanted Port Home Guide An implanted port is a type of central line that is placed under the skin. Central lines are used to provide IV access when treatment or nutrition needs to be given through a person's veins. Implanted ports are used for long-term IV access. An implanted port may be placed because:  You need IV medicine that would be irritating to the small veins in your hands or arms.  You need long-term IV medicines, such as antibiotics.  You need IV nutrition for a long period.  You need frequent blood draws for lab tests.  You need dialysis.  Implanted ports are usually placed in the chest area, but they can also be placed in the upper arm, the abdomen, or the leg. An implanted port has two main parts:  Reservoir. The reservoir is round and will appear as a small, raised area under your skin. The reservoir is the part where a needle is inserted to give medicines or draw blood.  Catheter. The catheter is a thin, flexible tube that extends from the reservoir. The catheter is placed into a large vein. Medicine that is inserted into the reservoir goes into the catheter and then into the vein.  How will I care for my incision site? Do not get the incision site wet. Bathe or shower as directed by your health care provider. How is my port accessed? Special steps must be taken to access the port:  Before the port is accessed, a numbing cream can be placed on the skin. This helps numb the skin over the port site.  Your health care provider uses a sterile technique to access the port. ? Your health care provider must put on a mask and sterile gloves. ? The skin over your port is cleaned carefully with an antiseptic and allowed to dry. ? The port is gently pinched between sterile gloves, and a needle is inserted into the port.  Only "non-coring" port needles should be used to access the port. Once the port is accessed, a blood return should be checked. This helps ensure that the port  is in the vein and is not clogged.  If your port needs to remain accessed for a constant infusion, a clear (transparent) bandage will be placed over the needle site. The bandage and needle will need to be changed every week, or as directed by your health care provider.  Keep the bandage covering the needle clean and dry. Do not get it wet. Follow your health care provider's instructions on how to take a shower or bath while the port is accessed.  If your port does not need to stay accessed, no bandage is needed over the port.  What is flushing? Flushing helps keep the port from getting clogged. Follow your health care provider's instructions on how and when to flush the port. Ports are usually flushed with saline solution or a medicine called heparin. The need for flushing will depend on how the port is used.  If the port is used for intermittent medicines or blood draws, the port will need to be flushed: ? After medicines have been given. ? After blood has been drawn. ? As part of routine maintenance.  If a constant infusion is running, the port may not need to be flushed.  How long will my port stay implanted? The port can stay in for as long as your health care provider thinks it is needed. When it is time for the port to come out, surgery will be   done to remove it. The procedure is similar to the one performed when the port was put in. When should I seek immediate medical care? When you have an implanted port, you should seek immediate medical care if:  You notice a bad smell coming from the incision site.  You have swelling, redness, or drainage at the incision site.  You have more swelling or pain at the port site or the surrounding area.  You have a fever that is not controlled with medicine.  This information is not intended to replace advice given to you by your health care provider. Make sure you discuss any questions you have with your health care provider. Document  Released: 01/15/2005 Document Revised: 06/23/2015 Document Reviewed: 09/22/2012 Elsevier Interactive Patient Education  2017 [employer]  

## 2016-07-15 ENCOUNTER — Encounter: Payer: Self-pay | Admitting: Hematology

## 2016-07-16 ENCOUNTER — Ambulatory Visit (HOSPITAL_BASED_OUTPATIENT_CLINIC_OR_DEPARTMENT_OTHER): Payer: 59

## 2016-07-16 VITALS — BP 124/80 | HR 101 | Temp 97.5°F | Resp 20

## 2016-07-16 DIAGNOSIS — C50412 Malignant neoplasm of upper-outer quadrant of left female breast: Secondary | ICD-10-CM | POA: Diagnosis not present

## 2016-07-16 DIAGNOSIS — Z5189 Encounter for other specified aftercare: Secondary | ICD-10-CM | POA: Diagnosis not present

## 2016-07-16 DIAGNOSIS — Z171 Estrogen receptor negative status [ER-]: Principal | ICD-10-CM

## 2016-07-16 MED ORDER — PEGFILGRASTIM INJECTION 6 MG/0.6ML ~~LOC~~
6.0000 mg | PREFILLED_SYRINGE | Freq: Once | SUBCUTANEOUS | Status: AC
  Administered 2016-07-16: 6 mg via SUBCUTANEOUS
  Filled 2016-07-16: qty 0.6

## 2016-07-16 NOTE — Patient Instructions (Signed)
Pegfilgrastim injection What is this medicine? PEGFILGRASTIM (PEG fil gra stim) is a long-acting granulocyte colony-stimulating factor that stimulates the growth of neutrophils, a type of white blood cell important in the body's fight against infection. It is used to reduce the incidence of fever and infection in patients with certain types of cancer who are receiving chemotherapy that affects the bone marrow, and to increase survival after being exposed to high doses of radiation. This medicine may be used for other purposes; ask your health care provider or pharmacist if you have questions. COMMON BRAND NAME(S): Neulasta What should I tell my health care provider before I take this medicine? They need to know if you have any of these conditions: -kidney disease -latex allergy -ongoing radiation therapy -sickle cell disease -skin reactions to acrylic adhesives (On-Body Injector only) -an unusual or allergic reaction to pegfilgrastim, filgrastim, other medicines, foods, dyes, or preservatives -pregnant or trying to get pregnant -breast-feeding How should I use this medicine? This medicine is for injection under the skin. If you get this medicine at home, you will be taught how to prepare and give the pre-filled syringe or how to use the On-body Injector. Refer to the patient Instructions for Use for detailed instructions. Use exactly as directed. Tell your healthcare provider immediately if you suspect that the On-body Injector may not have performed as intended or if you suspect the use of the On-body Injector resulted in a missed or partial dose. It is important that you put your used needles and syringes in a special sharps container. Do not put them in a trash can. If you do not have a sharps container, call your pharmacist or healthcare provider to get one. Talk to your pediatrician regarding the use of this medicine in children. While this drug may be prescribed for selected conditions,  precautions do apply. Overdosage: If you think you have taken too much of this medicine contact a poison control center or emergency room at once. NOTE: This medicine is only for you. Do not share this medicine with others. What if I miss a dose? It is important not to miss your dose. Call your doctor or health care professional if you miss your dose. If you miss a dose due to an On-body Injector failure or leakage, a new dose should be administered as soon as possible using a single prefilled syringe for manual use. What may interact with this medicine? Interactions have not been studied. Give your health care provider a list of all the medicines, herbs, non-prescription drugs, or dietary supplements you use. Also tell them if you smoke, drink alcohol, or use illegal drugs. Some items may interact with your medicine. This list may not describe all possible interactions. Give your health care provider a list of all the medicines, herbs, non-prescription drugs, or dietary supplements you use. Also tell them if you smoke, drink alcohol, or use illegal drugs. Some items may interact with your medicine. What should I watch for while using this medicine? You may need blood work done while you are taking this medicine. If you are going to need a MRI, CT scan, or other procedure, tell your doctor that you are using this medicine (On-Body Injector only). What side effects may I notice from receiving this medicine? Side effects that you should report to your doctor or health care professional as soon as possible: -allergic reactions like skin rash, itching or hives, swelling of the face, lips, or tongue -dizziness -fever -pain, redness, or irritation at site  where injected -pinpoint red spots on the skin -red or dark-brown urine -shortness of breath or breathing problems -stomach or side pain, or pain at the shoulder -swelling -tiredness -trouble passing urine or change in the amount of urine Side  effects that usually do not require medical attention (report to your doctor or health care professional if they continue or are bothersome): -bone pain -muscle pain This list may not describe all possible side effects. Call your doctor for medical advice about side effects. You may report side effects to FDA at 1-800-FDA-1088. Where should I keep my medicine? Keep out of the reach of children. Store pre-filled syringes in a refrigerator between 2 and 8 degrees C (36 and 46 degrees F). Do not freeze. Keep in carton to protect from light. Throw away this medicine if it is left out of the refrigerator for more than 48 hours. Throw away any unused medicine after the expiration date. NOTE: This sheet is a summary. It may not cover all possible information. If you have questions about this medicine, talk to your doctor, pharmacist, or health care provider.  2018 Elsevier/Gold Standard (2016-01-12 12:58:03)

## 2016-07-19 ENCOUNTER — Observation Stay (HOSPITAL_COMMUNITY)
Admission: EM | Admit: 2016-07-19 | Discharge: 2016-07-20 | Disposition: A | Discharge status: Home / Self Care | Payer: 59 | Attending: Internal Medicine | Admitting: Internal Medicine

## 2016-07-19 ENCOUNTER — Emergency Department (HOSPITAL_COMMUNITY): Payer: 59

## 2016-07-19 ENCOUNTER — Telehealth: Payer: Self-pay

## 2016-07-19 ENCOUNTER — Other Ambulatory Visit: Payer: Self-pay

## 2016-07-19 ENCOUNTER — Encounter (HOSPITAL_COMMUNITY): Payer: Self-pay

## 2016-07-19 DIAGNOSIS — Z853 Personal history of malignant neoplasm of breast: Secondary | ICD-10-CM | POA: Diagnosis not present

## 2016-07-19 DIAGNOSIS — Z79899 Other long term (current) drug therapy: Secondary | ICD-10-CM | POA: Insufficient documentation

## 2016-07-19 DIAGNOSIS — J45909 Unspecified asthma, uncomplicated: Secondary | ICD-10-CM | POA: Diagnosis not present

## 2016-07-19 DIAGNOSIS — E872 Acidosis, unspecified: Secondary | ICD-10-CM | POA: Diagnosis present

## 2016-07-19 DIAGNOSIS — M1990 Unspecified osteoarthritis, unspecified site: Secondary | ICD-10-CM | POA: Insufficient documentation

## 2016-07-19 DIAGNOSIS — Z7984 Long term (current) use of oral hypoglycemic drugs: Secondary | ICD-10-CM | POA: Diagnosis not present

## 2016-07-19 DIAGNOSIS — D61810 Antineoplastic chemotherapy induced pancytopenia: Secondary | ICD-10-CM | POA: Diagnosis not present

## 2016-07-19 DIAGNOSIS — N390 Urinary tract infection, site not specified: Secondary | ICD-10-CM | POA: Insufficient documentation

## 2016-07-19 DIAGNOSIS — E119 Type 2 diabetes mellitus without complications: Secondary | ICD-10-CM | POA: Diagnosis not present

## 2016-07-19 DIAGNOSIS — Z880 Allergy status to penicillin: Secondary | ICD-10-CM | POA: Insufficient documentation

## 2016-07-19 DIAGNOSIS — I10 Essential (primary) hypertension: Secondary | ICD-10-CM | POA: Insufficient documentation

## 2016-07-19 DIAGNOSIS — E860 Dehydration: Secondary | ICD-10-CM | POA: Diagnosis not present

## 2016-07-19 DIAGNOSIS — R0602 Shortness of breath: Secondary | ICD-10-CM | POA: Diagnosis not present

## 2016-07-19 DIAGNOSIS — T451X5A Adverse effect of antineoplastic and immunosuppressive drugs, initial encounter: Secondary | ICD-10-CM | POA: Diagnosis present

## 2016-07-19 DIAGNOSIS — E876 Hypokalemia: Principal | ICD-10-CM | POA: Insufficient documentation

## 2016-07-19 DIAGNOSIS — Z87891 Personal history of nicotine dependence: Secondary | ICD-10-CM | POA: Diagnosis not present

## 2016-07-19 LAB — COMPREHENSIVE METABOLIC PANEL
ALBUMIN: 3.6 g/dL (ref 3.5–5.0)
ALK PHOS: 81 U/L (ref 38–126)
ALT: 27 U/L (ref 14–54)
AST: 25 U/L (ref 15–41)
Anion gap: 14 (ref 5–15)
BUN: 20 mg/dL (ref 6–20)
CALCIUM: 8.7 mg/dL — AB (ref 8.9–10.3)
CO2: 23 mmol/L (ref 22–32)
CREATININE: 0.75 mg/dL (ref 0.44–1.00)
Chloride: 97 mmol/L — ABNORMAL LOW (ref 101–111)
GFR calc Af Amer: >60 mL/min (ref >60)
GFR calc non Af Amer: >60 mL/min (ref >60)
GLUCOSE: 236 mg/dL — AB (ref 65–99)
Potassium: 3.3 mmol/L — ABNORMAL LOW (ref 3.5–5.1)
Sodium: 134 mmol/L — ABNORMAL LOW (ref 135–145)
TOTAL PROTEIN: 6.8 g/dL (ref 6.5–8.1)
Total Bilirubin: 0.6 mg/dL (ref 0.3–1.2)

## 2016-07-19 LAB — I-STAT CHEM 8, ED
BUN: 19 mg/dL (ref 6–20)
CALCIUM ION: 1.08 mmol/L — AB (ref 1.15–1.40)
Chloride: 97 mmol/L — ABNORMAL LOW (ref 101–111)
Creatinine, Ser: 0.6 mg/dL (ref 0.44–1.00)
Glucose, Bld: 238 mg/dL — ABNORMAL HIGH (ref 65–99)
HCT: 36 % (ref 36.0–46.0)
HEMOGLOBIN: 12.2 g/dL (ref 12.0–15.0)
Potassium: 3.3 mmol/L — ABNORMAL LOW (ref 3.5–5.1)
Sodium: 134 mmol/L — ABNORMAL LOW (ref 135–145)
TCO2: 22 mmol/L (ref 0–100)

## 2016-07-19 LAB — URINALYSIS, ROUTINE W REFLEX MICROSCOPIC
Bilirubin Urine: NEGATIVE
Glucose, UA: >=500 mg/dL — AB
HGB URINE DIPSTICK: NEGATIVE
Ketones, ur: 5 mg/dL — AB
LEUKOCYTES UA: NEGATIVE
NITRITE: POSITIVE — AB
PH: 5 (ref 5.0–8.0)
Protein, ur: NEGATIVE mg/dL
Specific Gravity, Urine: 1.006 (ref 1.005–1.030)

## 2016-07-19 LAB — I-STAT CG4 LACTIC ACID, ED
LACTIC ACID, VENOUS: 2.26 mmol/L — AB (ref 0.5–1.9)
Lactic Acid, Venous: 4.48 mmol/L (ref 0.5–1.9)

## 2016-07-19 LAB — DIFFERENTIAL
Basophils Absolute: 0 10*3/uL (ref 0.0–0.1)
Basophils Relative: 1 %
EOS ABS: 0 10*3/uL (ref 0.0–0.7)
EOS PCT: 1 %
Lymphocytes Relative: 33 %
Lymphs Abs: 0.7 10*3/uL (ref 0.7–4.0)
Monocytes Absolute: 0 10*3/uL — ABNORMAL LOW (ref 0.1–1.0)
Monocytes Relative: 2 %
NEUTROS PCT: 65 %
Neutro Abs: 1.3 10*3/uL — ABNORMAL LOW (ref 1.7–7.7)

## 2016-07-19 LAB — GLUCOSE, CAPILLARY
GLUCOSE-CAPILLARY: 274 mg/dL — AB (ref 65–99)
GLUCOSE-CAPILLARY: 146 mg/dL — AB (ref 65–99)

## 2016-07-19 LAB — TSH: TSH: 0.321 u[IU]/mL — ABNORMAL LOW (ref 0.350–4.500)

## 2016-07-19 LAB — CBC
HEMATOCRIT: 35.4 % — AB (ref 36.0–46.0)
Hemoglobin: 11.8 g/dL — ABNORMAL LOW (ref 12.0–15.0)
MCH: 31 pg (ref 26.0–34.0)
MCHC: 33.3 g/dL (ref 30.0–36.0)
MCV: 92.9 fL (ref 78.0–100.0)
PLATELETS: 151 10*3/uL (ref 150–400)
RBC: 3.81 MIL/uL — AB (ref 3.87–5.11)
RDW: 17.2 % — AB (ref 11.5–15.5)
WBC: 2.2 10*3/uL — AB (ref 4.0–10.5)

## 2016-07-19 LAB — CBG MONITORING, ED: Glucose-Capillary: 260 mg/dL — ABNORMAL HIGH (ref 65–99)

## 2016-07-19 LAB — PROTIME-INR
INR: 0.99
Prothrombin Time: 13 seconds (ref 11.4–15.2)

## 2016-07-19 MED ORDER — SODIUM CHLORIDE 0.9% FLUSH
10.0000 mL | Freq: Two times a day (BID) | INTRAVENOUS | Status: DC
Start: 2016-07-19 — End: 2016-07-20
  Administered 2016-07-19 – 2016-07-20 (×2): 10 mL

## 2016-07-19 MED ORDER — ACETAMINOPHEN 325 MG PO TABS: 650.0000 mg | ORAL_TABLET | Freq: Four times a day (QID) | ORAL | Status: DC | PRN

## 2016-07-19 MED ORDER — DICYCLOMINE HCL 20 MG PO TABS
20.0000 mg | ORAL_TABLET | Freq: Three times a day (TID) | ORAL | Status: DC
  Administered 2016-07-19 – 2016-07-20 (×4): 20 mg via ORAL
  Filled 2016-07-19 (×5): qty 1

## 2016-07-19 MED ORDER — VITAMINS FOR THE HAIR PO TABS: 2.0000 | ORAL_TABLET | Freq: Two times a day (BID) | ORAL | Status: DC

## 2016-07-19 MED ORDER — HYDROCODONE-ACETAMINOPHEN 5-325 MG PO TABS: 1.0000 | ORAL_TABLET | ORAL | Status: DC | PRN

## 2016-07-19 MED ORDER — INSULIN ASPART 100 UNIT/ML ~~LOC~~ SOLN
0.0000 [IU] | Freq: Three times a day (TID) | SUBCUTANEOUS | Status: DC
  Administered 2016-07-19: 5 [IU] via SUBCUTANEOUS
  Administered 2016-07-20: 1 [IU] via SUBCUTANEOUS

## 2016-07-19 MED ORDER — CIPROFLOXACIN HCL 500 MG PO TABS
500.0000 mg | ORAL_TABLET | Freq: Two times a day (BID) | ORAL | Status: DC
  Administered 2016-07-19 – 2016-07-20 (×2): 500 mg via ORAL
  Filled 2016-07-19 (×2): qty 1

## 2016-07-19 MED ORDER — PREDNISONE 20 MG PO TABS
30.0000 mg | ORAL_TABLET | Freq: Every day | ORAL | Status: DC
  Administered 2016-07-20: 30 mg via ORAL
  Filled 2016-07-19 (×2): qty 1

## 2016-07-19 MED ORDER — GABAPENTIN 100 MG PO CAPS
200.0000 mg | ORAL_CAPSULE | Freq: Every day | ORAL | Status: DC
  Administered 2016-07-19: 200 mg via ORAL
  Filled 2016-07-19: qty 2

## 2016-07-19 MED ORDER — POTASSIUM CHLORIDE CRYS ER 20 MEQ PO TBCR
40.0000 meq | EXTENDED_RELEASE_TABLET | Freq: Once | ORAL | Status: AC
  Administered 2016-07-19: 40 meq via ORAL
  Filled 2016-07-19: qty 2

## 2016-07-19 MED ORDER — ALBUTEROL SULFATE HFA 108 (90 BASE) MCG/ACT IN AERS: 1.0000 | INHALATION_SPRAY | Freq: Four times a day (QID) | RESPIRATORY_TRACT | Status: DC | PRN

## 2016-07-19 MED ORDER — ALBUTEROL SULFATE (2.5 MG/3ML) 0.083% IN NEBU: 2.5000 mg | INHALATION_SOLUTION | Freq: Four times a day (QID) | RESPIRATORY_TRACT | Status: DC | PRN

## 2016-07-19 MED ORDER — VANCOMYCIN HCL 10 G IV SOLR
1500.0000 mg | Freq: Once | INTRAVENOUS | Status: DC
  Administered 2016-07-19: 1500 mg via INTRAVENOUS
  Filled 2016-07-19: qty 1500

## 2016-07-19 MED ORDER — POTASSIUM CHLORIDE CRYS ER 20 MEQ PO TBCR
60.0000 meq | EXTENDED_RELEASE_TABLET | Freq: Once | ORAL | Status: AC
  Administered 2016-07-19: 60 meq via ORAL
  Filled 2016-07-19: qty 3

## 2016-07-19 MED ORDER — ONDANSETRON HCL 4 MG/2ML IJ SOLN: 4.0000 mg | Freq: Four times a day (QID) | INTRAMUSCULAR | Status: DC | PRN

## 2016-07-19 MED ORDER — ACETAMINOPHEN 650 MG RE SUPP: 650.0000 mg | Freq: Four times a day (QID) | RECTAL | Status: DC | PRN

## 2016-07-19 MED ORDER — NIACIN 250 MG PO TABS
250.0000 mg | ORAL_TABLET | Freq: Every day | ORAL | Status: DC
  Administered 2016-07-19: 250 mg via ORAL
  Filled 2016-07-19: qty 1

## 2016-07-19 MED ORDER — SODIUM CHLORIDE 0.9 % IV BOLUS (SEPSIS)
1000.0000 mL | Freq: Once | INTRAVENOUS | Status: AC
  Administered 2016-07-19: 1000 mL via INTRAVENOUS

## 2016-07-19 MED ORDER — SODIUM CHLORIDE 0.9 % IV SOLN
INTRAVENOUS | Status: DC
  Administered 2016-07-19 – 2016-07-20 (×2): via INTRAVENOUS

## 2016-07-19 MED ORDER — HEPARIN SODIUM (PORCINE) 5000 UNIT/ML IJ SOLN
5000.0000 [IU] | Freq: Three times a day (TID) | INTRAMUSCULAR | Status: DC
  Filled 2016-07-19 (×3): qty 1

## 2016-07-19 MED ORDER — ONDANSETRON HCL 4 MG PO TABS: 4.0000 mg | ORAL_TABLET | Freq: Four times a day (QID) | ORAL | Status: DC | PRN

## 2016-07-19 MED ORDER — POTASSIUM 99 MG PO TABS: 1.0000 | ORAL_TABLET | Freq: Two times a day (BID) | ORAL | Status: DC

## 2016-07-19 MED ORDER — MESALAMINE 800 MG PO TBEC: 2.0000 | DELAYED_RELEASE_TABLET | Freq: Three times a day (TID) | ORAL | Status: DC

## 2016-07-19 MED ORDER — SODIUM CHLORIDE 0.9 % IV BOLUS (SEPSIS)
1500.0000 mL | Freq: Once | INTRAVENOUS | Status: AC
  Administered 2016-07-19: 1500 mL via INTRAVENOUS

## 2016-07-19 MED ORDER — MAGNESIUM OXIDE 400 (241.3 MG) MG PO TABS
800.0000 mg | ORAL_TABLET | Freq: Once | ORAL | Status: AC
  Administered 2016-07-19: 800 mg via ORAL
  Filled 2016-07-19: qty 2

## 2016-07-19 MED ORDER — PROCHLORPERAZINE MALEATE 10 MG PO TABS: 10.0000 mg | ORAL_TABLET | Freq: Four times a day (QID) | ORAL | Status: DC | PRN

## 2016-07-19 MED ORDER — LORAZEPAM 0.5 MG PO TABS: 0.5000 mg | ORAL_TABLET | Freq: Once | ORAL | Status: DC | PRN

## 2016-07-19 MED ORDER — METOPROLOL TARTRATE 50 MG PO TABS
50.0000 mg | ORAL_TABLET | Freq: Every day | ORAL | Status: DC
  Administered 2016-07-19: 50 mg via ORAL
  Filled 2016-07-19: qty 1

## 2016-07-19 MED ORDER — SODIUM CHLORIDE 0.9% FLUSH: 10.0000 mL | INTRAVENOUS | Status: DC | PRN

## 2016-07-19 MED ORDER — PIPERACILLIN-TAZOBACTAM 3.375 G IVPB 30 MIN
3.3750 g | Freq: Once | INTRAVENOUS | Status: AC
  Administered 2016-07-19: 3.375 g via INTRAVENOUS
  Filled 2016-07-19: qty 50

## 2016-07-19 MED ORDER — ONDANSETRON HCL 4 MG PO TABS: 8.0000 mg | ORAL_TABLET | Freq: Two times a day (BID) | ORAL | Status: DC | PRN

## 2016-07-19 NOTE — H&P (Signed)
History and Physical    Brandi Dickson SMO:[id number] DOB: Sep 04, 1960 DOA: 07/19/2016  PCP: Orpah Melter, MD  Patient coming from: Home  Chief Complaint: Generalized weakness  HPI: Brandi Dickson is a 56 y.o. female with medical history significant of breast cancer she is on active chemotherapy, follows with [name]. She also has HTN, UC and DM. Patient came to the hospital because of generalized weakness. Patient received chemotherapy recently, patient noticed she had some nausea and loose stools which is improved by now that she started to have generalized weakness, feeling dizzy sometimes if she stands up very quickly, and had some palpitations. Patient came to the hospital for further evaluation. In the ED initial evaluation showed normal workup except leukopenia and lactic acid of 4.4.  ED Course:  Vitals: WNL Labs: WNL except lactic acid 4.48 and leukopenia of 2.2, hypokalemia of 3.3. Imaging: CXR clear Interventions: Given IV fluids and potassium  Review of Systems:  Constitutional: negative for anorexia, fevers and sweats Eyes: negative for irritation, redness and visual disturbance Ears, nose, mouth, throat, and face: negative for earaches, epistaxis, nasal congestion and sore throat Respiratory: negative for cough, dyspnea on exertion, sputum and wheezing Cardiovascular: negative for chest pain, dyspnea, lower extremity edema, orthopnea, palpitations and syncope Gastrointestinal: negative for abdominal pain, constipation, diarrhea, melena, nausea and vomiting Genitourinary:negative for dysuria, frequency and hematuria Hematologic/lymphatic: negative for bleeding, easy bruising and lymphadenopathy Musculoskeletal:negative for arthralgias, muscle weakness and stiff joints Neurological: negative for coordination problems, gait problems, headaches and weakness Endocrine: negative for diabetic symptoms including polydipsia, polyuria and weight loss Allergic/Immunologic:  negative for anaphylaxis, hay fever and urticaria  Past Medical History:  Diagnosis Date  . Arthritis   . Asthma    triggered with [name] perfumes and cigarette smoke  . Cancer ([place])   . Colitis   . Diabetes mellitus without complication ([place])   . Hypertension   . Neuropathy     Past Surgical History:  Procedure Laterality Date  . ABDOMINAL HYSTERECTOMY    . DILATION AND CURETTAGE OF UTERUS    . PORTACATH PLACEMENT Right 05/08/2016   Procedure: INSERTION PORT-A-CATH WITH [place];  Surgeon: [name], MD;  Location: [facility];  Service: General;  Laterality: Right;  . TONSILLECTOMY    . TOTAL HIP ARTHROPLASTY Right   . TOTAL HIP ARTHROPLASTY Left 03/06/2016   Procedure: LEFT TOTAL HIP ARTHROPLASTY ANTERIOR APPROACH;  Surgeon: [name], MD;  Location: [facility];  Service: Orthopedics;  Laterality: Left;     reports that she quit smoking about 13 years ago. Her smoking use included Cigarettes. She has a 29.00 pack-year smoking history. She has never used smokeless tobacco. She reports that she does not drink alcohol or use drugs.  Allergies  Allergen Reactions  . Lisinopril Palpitations  . Augmentin [Amoxicillin-Pot Clavulanate] Other (See Comments)    sts gives her a yeast infection Has patient had a PCN reaction causing immediate rash, facial/tongue/throat swelling, SOB or lightheadedness with hypotension: no Has patient had a PCN reaction causing severe rash involving mucus membranes or skin necrosis: no Has patient had a PCN reaction that required hospitalization no Has patient had a PCN reaction occurring within the last 10 years: unknown If all of the above answers are "NO", then may proceed with Cephalosporin use.   . Benadryl [Diphenhydramine] Itching and Anxiety    Per pt: "Makes my skin crawl"; makes pt sensitive to touch    Family History  Problem Relation Age of Onset  .  Colon cancer Father   . Stomach cancer Paternal Uncle   . Stomach cancer  Paternal Uncle   . Melanoma Brother   . Thyroid cancer Brother   . Breast cancer Maternal Aunt   . Breast cancer Maternal Aunt   . Breast cancer Cousin     Prior to Admission medications   Medication Sig Start Date End Date Taking? Authorizing Provider  albuterol (PROVENTIL HFA;VENTOLIN HFA) 108 (90 Base) MCG/ACT inhaler Inhale 1-2 puffs into the lungs every 6 (six) hours as needed for wheezing or shortness of breath.   Yes [provider]  dicyclomine (BENTYL) 20 MG tablet Take 1 tablet (20 mg total) by mouth 4 (four) times daily -  before meals and at bedtime. 06/29/16  Yes Owens Shark, NP  ferrous sulfate (FERROUSUL) 325 (65 FE) MG tablet Take 1 tablet (325 mg total) by mouth 3 (three) times daily with meals. Patient taking differently: Take 325 mg by mouth 2 (two) times daily with a meal.  03/06/16  Yes Babish, Rodman Key, PA-C  gabapentin (NEURONTIN) 100 MG capsule Take 200 mg by mouth at bedtime.    Yes [provider]  lidocaine-prilocaine (EMLA) cream Apply 1 application to skin 1.5 to 2 hrs before use.  Cover to secure cream with plastic wrap. 05/10/16  Yes Truitt Merle, MD  loperamide (IMODIUM) 1 MG/5ML solution Take 3 mg by mouth as needed for diarrhea or loose stools.   Yes [provider]  LORazepam (ATIVAN) 0.5 MG tablet Take 1 tablet (0.5 mg total) by mouth once as needed for anxiety. 05/02/16  Yes Truitt Merle, MD  magnesium oxide (MAG-OX) 400 MG tablet Take 400 mg by mouth 2 (two) times daily.   Yes [provider]  Mesalamine (ASACOL HD) 800 MG TBEC Take 2 tablets (1,600 mg total) by mouth 3 (three) times daily. 05/24/16  Yes Charlynne Cousins, MD  metFORMIN (GLUCOPHAGE-XR) 500 MG 24 hr tablet Take 1,000 mg by mouth at bedtime.   Yes [provider]  metoprolol (LOPRESSOR) 50 MG tablet Take 50 mg by mouth at bedtime.    Yes [provider]  niacin 250 MG tablet Take 250 mg by mouth at bedtime.    Yes [provider]    ondansetron (ZOFRAN) 8 MG tablet Take 1 tablet (8 mg total) by mouth 2 (two) times daily as needed for refractory nausea / vomiting. Start on day 3 after chemo. 05/02/16  Yes Truitt Merle, MD  Potassium 99 MG TABS Take 1 tablet by mouth 2 (two) times daily.   Yes [provider]  predniSONE (DELTASONE) 20 MG tablet Take 30 mg by mouth daily.  05/24/16  Yes [provider]  prochlorperazine (COMPAZINE) 10 MG tablet Take 1 tablet (10 mg total) by mouth every 6 (six) hours as needed (Nausea or vomiting). 05/02/16  Yes Truitt Merle, MD  Specialty Vitamins Products (VITAMINS FOR THE HAIR) TABS Take 2 tablets by mouth 2 (two) times daily.   Yes [provider]  traMADol (ULTRAM) 50 MG tablet Take 1-2 tablets (50-100 mg total) by mouth every 6 (six) hours as needed. 03/06/16  Yes Babish, Rodman Key, PA-C  triamterene-hydrochlorothiazide (MAXZIDE) 75-50 MG per tablet Take 0.5 tablets by mouth daily.    Yes [provider]  dexamethasone (DECADRON) 4 MG tablet Take 1 tablet (4 mg total) by mouth daily. Start the day before Taxotere. Then again the day after chemo for 3 days. Patient not taking: Reported on 07/13/2016 05/02/16  Truitt Merle, MD  diphenoxylate-atropine (LOMOTIL) 2.5-0.025 MG tablet Take one to two tablets every six hours as needed for diarrhea Patient not taking: Reported on 07/13/2016 06/14/16   Truitt Merle, MD  hydrocortisone (ANUSOL-HC) 25 MG suppository Place 1 suppository (25 mg total) rectally 2 (two) times daily. Patient not taking: Reported on 07/13/2016 05/24/16   Charlynne Cousins, MD    Physical Exam:  Vitals:   07/19/16 0903 07/19/16 1030 07/19/16 1427  BP: 118/74 138/89 139/78  Pulse: 97 95   Resp: 18 14 20   Temp: 97.9 F (36.6 C)    TempSrc: Oral    SpO2: 97% 100% 100%  Weight: 85.7 kg (189 lb)    Height: 5' 4.5" (1.638 m)      Constitutional: NAD, calm, comfortable Eyes: PERRL, lids and conjunctivae normal ENMT: Mucous membranes are moist. Posterior  pharynx clear of any exudate or lesions.Normal dentition.  Neck: normal, supple, no masses, no thyromegaly Respiratory: clear to auscultation bilaterally, no wheezing, no crackles. Normal respiratory effort. No accessory muscle use.  Cardiovascular: Regular rate and rhythm, no murmurs / rubs / gallops. No extremity edema. 2+ pedal pulses. No carotid bruits.  Abdomen: no tenderness, no masses palpated. No hepatosplenomegaly. Bowel sounds positive.  Musculoskeletal: no clubbing / cyanosis. No joint deformity upper and lower extremities. Good ROM, no contractures. Normal muscle tone.  Skin: no rashes, lesions, ulcers. No induration Neurologic: CN 2-12 grossly intact. Sensation intact, DTR normal. Strength 5/5 in all 4.  Psychiatric: Normal judgment and insight. Alert and oriented x 3. Normal mood.   Labs on Admission: I have personally reviewed following labs and imaging studies  CBC:  Recent Labs Lab 07/13/16 0825 07/19/16 0950 07/19/16 1005  WBC 13.6* 2.2*  --   NEUTROABS 7.7* 1.3*  --   HGB 11.2* 11.8* 12.2  HCT 34.5* 35.4* 36.0  MCV 93.5 92.9  --   PLT 202 151  --    Basic Metabolic Panel:  Recent Labs Lab 07/13/16 0825 07/19/16 0952 07/19/16 1005  NA 144 134* 134*  K 3.0* 3.3* 3.3*  CL  --  97* 97*  CO2 30* 23  --   GLUCOSE 135 236* 238*  BUN 22.3 20 19   CREATININE 0.9 0.75 0.60  CALCIUM 9.7 8.7*  --    GFR: Estimated Creatinine Clearance: 83 mL/min (by C-G formula based on SCr of 0.6 mg/dL). Liver Function Tests:  Recent Labs Lab 07/13/16 0825 07/19/16 0952  AST 12 25  ALT 23 27  ALKPHOS 97 81  BILITOT 0.27 0.6  PROT 6.4 6.8  ALBUMIN 3.2* 3.6   No results for input(s): LIPASE, AMYLASE in the last 168 hours. No results for input(s): AMMONIA in the last 168 hours. Coagulation Profile: No results for input(s): INR, PROTIME in the last 168 hours. Cardiac Enzymes: No results for input(s): CKTOTAL, CKMB, CKMBINDEX, TROPONINI in the last 168 hours. BNP (last  3 results) No results for input(s): PROBNP in the last 8760 hours. HbA1C: No results for input(s): HGBA1C in the last 72 hours. CBG:  Recent Labs Lab 07/19/16 1006  GLUCAP 260*   Lipid Profile: No results for input(s): CHOL, HDL, LDLCALC, TRIG, CHOLHDL, LDLDIRECT in the last 72 hours. Thyroid Function Tests: No results for input(s): TSH, T4TOTAL, FREET4, T3FREE, THYROIDAB in the last 72 hours. Anemia Panel: No results for input(s): VITAMINB12, FOLATE, FERRITIN, TIBC, IRON, RETICCTPCT in the last 72 hours. Urine analysis:    Component Value Date/Time   COLORURINE STRAW (A) 07/19/2016 1238  APPEARANCEUR CLEAR 07/19/2016 1238   LABSPEC 1.006 07/19/2016 1238   PHURINE 5.0 07/19/2016 1238   GLUCOSEU >=500 (A) 07/19/2016 1238   HGBUR NEGATIVE 07/19/2016 1238   BILIRUBINUR NEGATIVE 07/19/2016 1238   KETONESUR 5 (A) 07/19/2016 1238   PROTEINUR NEGATIVE 07/19/2016 1238   UROBILINOGEN 0.2 12/30/2008 1317   NITRITE POSITIVE (A) 07/19/2016 1238   LEUKOCYTESUR NEGATIVE 07/19/2016 1238   Sepsis Labs: !!!!!!!!!!!!!!!!!!!!!!!!!!!!!!!!!!!!!!!!!!!! Invalid input(s): PROCALCITONIN, LACTICIDVEN No results found for this or any previous visit (from the past 240 hour(s)).   Radiological Exams on Admission: Dg Chest 2 View  Result Date: 07/19/2016 CLINICAL DATA:  Increasing weakness, shortness of Breath. History of breast cancer EXAM: CHEST  2 VIEW COMPARISON:  05/13/2016 FINDINGS: Right Port-A-Cath remains in place, unchanged. Heart and mediastinal contours are within normal limits. No focal opacities or effusions. No acute bony abnormality. IMPRESSION: No active cardiopulmonary disease. Electronically Signed   By: Rolm Baptise M.D.   On: 07/19/2016 10:30    EKG: Independently reviewed.   Assessment/Plan Active Problems:   Hypokalemia   Diabetes mellitus without complication (HCC)   Antineoplastic chemotherapy induced pancytopenia (HCC)   Lactic acidosis    Generalized  weakness -This is likely secondary to dehydration and hypokalemia. -She had several loose stools, has some nausea after the chemotherapy with poor oral intake. -Start aggressive hydration with IV fluids, check BMP in a.m.  Hypokalemia -Potassium is 3.3, replete with oral supplements and check BMP in a.m.  Question of UTI -Wbc's 6-30 and positive nitrite she didn't have significant LUT symptoms. -She would receive a dose of Zosyn earlier today, not sure of the culture will be very informative. -She probably can benefit from 3-5 day course of ciprofloxacin.  Diabetes  -On metformin, hold and use SSI while she is in the hospital.  Breast cancer status post chemotherapy -She just received chemotherapy last week and received no last on this Monday.  Elevated lactic acid -Lactic acid is 4.48, patient is afebrile, likely this is secondary to dehydration and hypoperfusion rather than sepsis. -Hydrated aggressively with IV fluids, check lactic acid in a.m.   DVT prophylaxis: SQ Heparin Code Status: Full code Family Communication: Plan D/W patient Disposition Plan: Home Consults called:  Admission status: Observation   Fredrick Dray A MD [facility] Pager [phone]  If 7PM-7AM, please contact night-coverage [email or web address] Password [facility]  07/19/2016, 3:56 PM

## 2016-07-19 NOTE — ED Provider Notes (Signed)
[facility] DEPT Provider Note   CSN: [id number] Arrival date & time: 07/19/16  4132     History   Chief Complaint Chief Complaint  Patient presents with  . Weakness  . Near Syncope  . Palpitations  . chemo patient    HPI Brandi Dickson is a 56 y.o. female.  56 yo F with a cc of weakness, nausea. Going on for past couple days.  Usually occurs about this time after chemo.  Last chemo about 6 days ago. Denies fever, though having some subjective fevers and chills.  Denies cough, congestion.  Denies diarrhea.     The history is provided by the patient.  Illness  This is a new problem. The current episode started 2 days ago. The problem occurs constantly. The problem has been rapidly worsening. Pertinent negatives include no chest pain, no headaches and no shortness of breath. Nothing aggravates the symptoms. Nothing relieves the symptoms. She has tried nothing for the symptoms. The treatment provided no relief.    Past Medical History:  Diagnosis Date  . Arthritis   . Asthma    triggered with [name] perfumes and cigarette smoke  . Cancer ([place])   . Colitis   . Diabetes mellitus without complication ([name])   . Hypertension   . Neuropathy     Patient Active Problem List   Diagnosis Date Noted  . Lactic acidosis 07/19/2016  . Antineoplastic chemotherapy induced pancytopenia ([place])   . GIB (gastrointestinal bleeding) 06/07/2016  . Hypertension   . Diabetes mellitus without complication ([name])   . Ulcerative colitis without complications ([name]) 44/01/270  . Ulcerative rectosigmoiditis with rectal bleeding ([name])   . Hematochezia   . Generalized abdominal pain   . Nausea vomiting and diarrhea   . Diarrhea of presumed infectious origin   . Hyponatremia 05/18/2016  . Hypokalemia 05/18/2016  . Hypomagnesemia 05/18/2016  . Dehydration 05/18/2016  . Blood in stool 05/18/2016  . Thrombocytopenia ([name]) 05/18/2016  . GI bleeding 05/18/2016  . Port catheter in place  05/11/2016  . Breast cancer of upper-outer quadrant of left female breast ([name]) 05/02/2016  . S/P left THA, AA 03/06/2016  . Rectal bleeding 07/14/2015  . LLQ abdominal pain 07/14/2015  . Special screening for malignant neoplasms, colon 07/14/2015  . Loss of weight 07/14/2015  . Family history of colon cancer 07/14/2015    Past Surgical History:  Procedure Laterality Date  . ABDOMINAL HYSTERECTOMY    . DILATION AND CURETTAGE OF UTERUS    . PORTACATH PLACEMENT Right 05/08/2016   Procedure: INSERTION PORT-A-CATH WITH [place];  Surgeon: [name], MD;  Location: [facility];  Service: General;  Laterality: Right;  . TONSILLECTOMY    . TOTAL HIP ARTHROPLASTY Right   . TOTAL HIP ARTHROPLASTY Left 03/06/2016   Procedure: LEFT TOTAL HIP ARTHROPLASTY ANTERIOR APPROACH;  Surgeon: [name], MD;  Location: [facility];  Service: Orthopedics;  Laterality: Left;    OB History    No data available       Home Medications    Prior to Admission medications   Medication Sig Start Date End Date Taking? Authorizing Provider  albuterol (PROVENTIL HFA;VENTOLIN HFA) 108 (90 Base) MCG/ACT inhaler Inhale 1-2 puffs into the lungs every 6 (six) hours as needed for wheezing or shortness of breath.   Yes [provider]  dicyclomine (BENTYL) 20 MG tablet Take 1 tablet (20 mg total) by mouth 4 (four) times daily -  before meals and at bedtime. 06/29/16  Yes  Owens Shark, NP  ferrous sulfate (FERROUSUL) 325 (65 FE) MG tablet Take 1 tablet (325 mg total) by mouth 3 (three) times daily with meals. Patient taking differently: Take 325 mg by mouth 2 (two) times daily with a meal.  03/06/16  Yes Babish, Rodman Key, PA-C  gabapentin (NEURONTIN) 100 MG capsule Take 200 mg by mouth at bedtime.    Yes [provider]  lidocaine-prilocaine (EMLA) cream Apply 1 application to skin 1.5 to 2 hrs before use.  Cover to secure cream with plastic wrap. 05/10/16  Yes Truitt Merle, MD  loperamide (IMODIUM) 1  MG/5ML solution Take 3 mg by mouth as needed for diarrhea or loose stools.   Yes [provider]  LORazepam (ATIVAN) 0.5 MG tablet Take 1 tablet (0.5 mg total) by mouth once as needed for anxiety. 05/02/16  Yes Truitt Merle, MD  magnesium oxide (MAG-OX) 400 MG tablet Take 400 mg by mouth 2 (two) times daily.   Yes [provider]  Mesalamine (ASACOL HD) 800 MG TBEC Take 2 tablets (1,600 mg total) by mouth 3 (three) times daily. 05/24/16  Yes Charlynne Cousins, MD  metFORMIN (GLUCOPHAGE-XR) 500 MG 24 hr tablet Take 1,000 mg by mouth at bedtime.   Yes [provider]  metoprolol (LOPRESSOR) 50 MG tablet Take 50 mg by mouth at bedtime.    Yes [provider]  niacin 250 MG tablet Take 250 mg by mouth at bedtime.    Yes [provider]  ondansetron (ZOFRAN) 8 MG tablet Take 1 tablet (8 mg total) by mouth 2 (two) times daily as needed for refractory nausea / vomiting. Start on day 3 after chemo. 05/02/16  Yes Truitt Merle, MD  Potassium 99 MG TABS Take 1 tablet by mouth 2 (two) times daily.   Yes [provider]  predniSONE (DELTASONE) 20 MG tablet Take 30 mg by mouth daily.  05/24/16  Yes [provider]  prochlorperazine (COMPAZINE) 10 MG tablet Take 1 tablet (10 mg total) by mouth every 6 (six) hours as needed (Nausea or vomiting). 05/02/16  Yes Truitt Merle, MD  Specialty Vitamins Products (VITAMINS FOR THE HAIR) TABS Take 2 tablets by mouth 2 (two) times daily.   Yes [provider]  traMADol (ULTRAM) 50 MG tablet Take 1-2 tablets (50-100 mg total) by mouth every 6 (six) hours as needed. 03/06/16  Yes Babish, Rodman Key, PA-C  triamterene-hydrochlorothiazide (MAXZIDE) 75-50 MG per tablet Take 0.5 tablets by mouth daily.    Yes [provider]  dexamethasone (DECADRON) 4 MG tablet Take 1 tablet (4 mg total) by mouth daily. Start the day before Taxotere. Then again the day after chemo for 3 days. Patient not taking: Reported on 07/13/2016  05/02/16   Truitt Merle, MD  diphenoxylate-atropine (LOMOTIL) 2.5-0.025 MG tablet Take one to two tablets every six hours as needed for diarrhea Patient not taking: Reported on 07/13/2016 06/14/16   Truitt Merle, MD  hydrocortisone (ANUSOL-HC) 25 MG suppository Place 1 suppository (25 mg total) rectally 2 (two) times daily. Patient not taking: Reported on 07/13/2016 05/24/16   Charlynne Cousins, MD    Family History Family History  Problem Relation Age of Onset  . Colon cancer Father   . Stomach cancer Paternal Uncle   . Stomach cancer Paternal Uncle   . Melanoma Brother   . Thyroid cancer Brother   . Breast cancer Maternal Aunt   . Breast cancer Maternal Aunt   . Breast cancer Cousin  Social History Social History  Substance Use Topics  . Smoking status: Former Smoker    Packs/day: 1.00    Years: 29.00    Types: Cigarettes    Quit date: 10/27/2002  . Smokeless tobacco: Never Used  . Alcohol use No     Allergies   Lisinopril; Augmentin [amoxicillin-pot clavulanate]; and Benadryl [diphenhydramine]   Review of Systems Review of Systems  Constitutional: Positive for chills and fever (subjective).  HENT: Negative for congestion and rhinorrhea.   Eyes: Negative for redness and visual disturbance.  Respiratory: Negative for shortness of breath and wheezing.   Cardiovascular: Negative for chest pain and palpitations.  Gastrointestinal: Positive for nausea and vomiting.  Genitourinary: Negative for dysuria and urgency.  Musculoskeletal: Negative for arthralgias and myalgias.  Skin: Negative for pallor and wound.  Neurological: Positive for weakness. Negative for dizziness and headaches.     Physical Exam Updated Vital Signs BP 139/78   Pulse 95   Temp 97.9 F (36.6 C) (Oral)   Resp 20   Ht 5' 4.5" (1.638 m)   Wt 85.7 kg (189 lb)   SpO2 100%   BMI 31.94 kg/m   Physical Exam  Constitutional: She is oriented to person, place, and time. She appears well-developed and  well-nourished. No distress.  HENT:  Head: Normocephalic and atraumatic.  Eyes: EOM are normal. Pupils are equal, round, and reactive to light.  Neck: Normal range of motion. Neck supple.  Cardiovascular: Normal rate and regular rhythm.  Exam reveals no gallop and no friction rub.   No murmur heard. Pulmonary/Chest: Effort normal. She has no wheezes. She has no rales.  Abdominal: Soft. She exhibits no distension and no mass. There is no tenderness. There is no guarding.  Musculoskeletal: She exhibits no edema or tenderness.  Neurological: She is alert and oriented to person, place, and time.  Skin: Skin is warm and dry. She is not diaphoretic.  Psychiatric: She has a normal mood and affect. Her behavior is normal.  Nursing note and vitals reviewed.    ED Treatments / Results  Labs (all labs ordered are listed, but only abnormal results are displayed) Labs Reviewed  CBC - Abnormal; Notable for the following:       Result Value   WBC 2.2 (*)    RBC 3.81 (*)    Hemoglobin 11.8 (*)    HCT 35.4 (*)    RDW 17.2 (*)    All other components within normal limits  URINALYSIS, ROUTINE W REFLEX MICROSCOPIC - Abnormal; Notable for the following:    Color, Urine STRAW (*)    Glucose, UA >=500 (*)    Ketones, ur 5 (*)    Nitrite POSITIVE (*)    Bacteria, UA FEW (*)    Squamous Epithelial / LPF 0-5 (*)    All other components within normal limits  COMPREHENSIVE METABOLIC PANEL - Abnormal; Notable for the following:    Sodium 134 (*)    Potassium 3.3 (*)    Chloride 97 (*)    Glucose, Bld 236 (*)    Calcium 8.7 (*)    All other components within normal limits  DIFFERENTIAL - Abnormal; Notable for the following:    Neutro Abs 1.3 (*)    Monocytes Absolute 0.0 (*)    All other components within normal limits  CBG MONITORING, ED - Abnormal; Notable for the following:    Glucose-Capillary 260 (*)    All other components within normal limits  I-STAT CG4 LACTIC ACID, ED -  Abnormal;  Notable for the following:    Lactic Acid, Venous 4.48 (*)    All other components within normal limits  I-STAT CHEM 8, ED - Abnormal; Notable for the following:    Sodium 134 (*)    Potassium 3.3 (*)    Chloride 97 (*)    Glucose, Bld 238 (*)    Calcium, Ion 1.08 (*)    All other components within normal limits  I-STAT CG4 LACTIC ACID, ED - Abnormal; Notable for the following:    Lactic Acid, Venous 2.26 (*)    All other components within normal limits  CULTURE, BLOOD (ROUTINE X 2)  CULTURE, BLOOD (ROUTINE X 2)  URINE CULTURE  I-STAT CG4 LACTIC ACID, ED    EKG  EKG Interpretation None       Radiology Dg Chest 2 View  Result Date: 07/19/2016 CLINICAL DATA:  Increasing weakness, shortness of Breath. History of breast cancer EXAM: CHEST  2 VIEW COMPARISON:  05/13/2016 FINDINGS: Right Port-A-Cath remains in place, unchanged. Heart and mediastinal contours are within normal limits. No focal opacities or effusions. No acute bony abnormality. IMPRESSION: No active cardiopulmonary disease. Electronically Signed   By: Rolm Baptise M.D.   On: 07/19/2016 10:30    Procedures Procedures (including critical care time)  Medications Ordered in ED Medications  sodium chloride 0.9 % bolus 1,000 mL (0 mLs Intravenous Stopped 07/19/16 1139)  piperacillin-tazobactam (ZOSYN) IVPB 3.375 g (0 g Intravenous Stopped 07/19/16 1139)  sodium chloride 0.9 % bolus 1,500 mL (0 mLs Intravenous Stopped 07/19/16 1427)  potassium chloride SA (K-DUR,KLOR-CON) CR tablet 40 mEq (40 mEq Oral Given 07/19/16 1206)  magnesium oxide (MAG-OX) tablet 800 mg (800 mg Oral Given 07/19/16 1140)     Initial Impression / Assessment and Plan / ED Course  I have reviewed the triage vital signs and the nursing notes.  Pertinent labs & imaging results that were available during my care of the patient were reviewed by me and considered in my medical decision making (see chart for details).     56 yo F With a chief complaints  of weakness. Patient is undergoing chemotherapy her last chemotherapy was about 6 days ago. Having some nausea and vomiting. Has had recurrent issues with hypokalemia as well as dehydration. Will give a liter of fluids. Check labs. Chest x-ray UA. Reassess.  Patient continues to be well-appearing and nontoxic. Initial lactate was 4-1/2. Concern for infection in a patient to me a neutropenic patient was given broad-spectrum antibiotics. Discussed with the hospitalist will admit.   My discussion with the hospitalist. I convinced that there is an infection however by the time of my conversation with him antibiotics were already given.  The patients results and plan were reviewed and discussed.   Any x-rays performed were independently reviewed by myself.   Differential diagnosis were considered with the presenting HPI.  Medications  sodium chloride 0.9 % bolus 1,000 mL (0 mLs Intravenous Stopped 07/19/16 1139)  piperacillin-tazobactam (ZOSYN) IVPB 3.375 g (0 g Intravenous Stopped 07/19/16 1139)  sodium chloride 0.9 % bolus 1,500 mL (0 mLs Intravenous Stopped 07/19/16 1427)  potassium chloride SA (K-DUR,KLOR-CON) CR tablet 40 mEq (40 mEq Oral Given 07/19/16 1206)  magnesium oxide (MAG-OX) tablet 800 mg (800 mg Oral Given 07/19/16 1140)    Vitals:   07/19/16 0903 07/19/16 1030 07/19/16 1427  BP: 118/74 138/89 139/78  Pulse: 97 95   Resp: 18 14 20   Temp: 97.9 F (36.6 C)    TempSrc: Oral  SpO2: 97% 100% 100%  Weight: 85.7 kg (189 lb)    Height: 5' 4.5" (1.638 m)      Final diagnoses:  Lactic acidosis    Admission/ observation were discussed with the admitting physician, patient and/or family and they are comfortable with the plan.    Final Clinical Impressions(s) / ED Diagnoses   Final diagnoses:  Lactic acidosis    New Prescriptions New Prescriptions   No medications on file     Deno Etienne, DO 07/19/16 1613

## 2016-07-19 NOTE — Telephone Encounter (Signed)
0806 Pt lvm that she feels weak and is having heart palpitations. Brandi Dickson she was going to ED. [phone] Checked and she is currently admitted in ED.

## 2016-07-19 NOTE — ED Notes (Signed)
Pt given grape juice 

## 2016-07-19 NOTE — ED Triage Notes (Signed)
Patient c/o progressing weakness since chemo 6 days ago. Patient states today she began having heart palpitations and feeling like she was going to faint.

## 2016-07-19 NOTE — Progress Notes (Signed)
PHARMACIST - PHYSICIAN ORDER COMMUNICATION  CONCERNING: P&T Medication Policy on Herbal Medications  DESCRIPTION:  This patient's order for:  Vitamins for the hair tabs, potassium 99 mg  has been noted.  This product(s) is classified as an "herbal" or natural product. Due to a lack of definitive safety studies or FDA approval, nonstandard manufacturing practices, plus the potential risk of unknown drug-drug interactions while on inpatient medications, the Pharmacy and Therapeutics Committee does not permit the use of "herbal" or natural products of this type within [facility].   ACTION TAKEN: The pharmacy department is unable to verify this order at this time and your patient has been informed of this safety policy. Please reevaluate patient's clinical condition at discharge and address if the herbal or natural product(s) should be resumed at that time.  Dia Sitter, PharmD, BCPS 07/19/2016 4:54 PM

## 2016-07-20 ENCOUNTER — Telehealth: Payer: Self-pay | Admitting: Hematology

## 2016-07-20 DIAGNOSIS — R531 Weakness: Secondary | ICD-10-CM | POA: Diagnosis not present

## 2016-07-20 LAB — GLUCOSE, CAPILLARY
Glucose-Capillary: 109 mg/dL — ABNORMAL HIGH (ref 65–99)
Glucose-Capillary: 184 mg/dL — ABNORMAL HIGH (ref 65–99)

## 2016-07-20 LAB — BASIC METABOLIC PANEL
Anion gap: 6 (ref 5–15)
BUN: 12 mg/dL (ref 6–20)
CALCIUM: 7.8 mg/dL — AB (ref 8.9–10.3)
CHLORIDE: 110 mmol/L (ref 101–111)
CO2: 26 mmol/L (ref 22–32)
Creatinine, Ser: 0.62 mg/dL (ref 0.44–1.00)
GFR calc Af Amer: >60 mL/min (ref >60)
GFR calc non Af Amer: >60 mL/min (ref >60)
GLUCOSE: 125 mg/dL — AB (ref 65–99)
Potassium: 3.5 mmol/L (ref 3.5–5.1)
Sodium: 142 mmol/L (ref 135–145)

## 2016-07-20 LAB — LACTIC ACID, PLASMA: Lactic Acid, Venous: 2 mmol/L (ref 0.5–1.9)

## 2016-07-20 LAB — CBC
HCT: 25.1 % — ABNORMAL LOW (ref 36.0–46.0)
Hemoglobin: 8.4 g/dL — ABNORMAL LOW (ref 12.0–15.0)
MCH: 31.2 pg (ref 26.0–34.0)
MCHC: 33.5 g/dL (ref 30.0–36.0)
MCV: 93.3 fL (ref 78.0–100.0)
PLATELETS: 94 10*3/uL — AB (ref 150–400)
RBC: 2.69 MIL/uL — AB (ref 3.87–5.11)
RDW: 17.9 % — AB (ref 11.5–15.5)
WBC: 2.4 10*3/uL — ABNORMAL LOW (ref 4.0–10.5)

## 2016-07-20 LAB — MAGNESIUM: Magnesium: 1.3 mg/dL — ABNORMAL LOW (ref 1.7–2.4)

## 2016-07-20 LAB — HEMOGLOBIN A1C
HEMOGLOBIN A1C: 6.6 % — AB (ref 4.8–5.6)
Mean Plasma Glucose: 143 mg/dL

## 2016-07-20 MED ORDER — POTASSIUM CHLORIDE CRYS ER 20 MEQ PO TBCR
40.0000 meq | EXTENDED_RELEASE_TABLET | Freq: Once | ORAL | Status: AC
  Administered 2016-07-20: 40 meq via ORAL
  Filled 2016-07-20: qty 2

## 2016-07-20 MED ORDER — CIPROFLOXACIN HCL 500 MG PO TABS: 500.0000 mg | ORAL_TABLET | Freq: Two times a day (BID) | ORAL | 0 refills | Status: AC

## 2016-07-20 MED ORDER — HEPARIN SOD (PORK) LOCK FLUSH 100 UNIT/ML IV SOLN
500.0000 [IU] | INTRAVENOUS | Status: AC | PRN
  Administered 2016-07-20: 500 [IU]

## 2016-07-20 MED ORDER — MAGNESIUM SULFATE 2 GM/50ML IV SOLN
2.0000 g | Freq: Once | INTRAVENOUS | Status: AC
  Administered 2016-07-20: 2 g via INTRAVENOUS
  Filled 2016-07-20: qty 50

## 2016-07-20 NOTE — Progress Notes (Signed)
DC instructions reviewed with patient. Port deaccessed. Patient understands DC instructions, husband also at bedside. Pt gathered belongings and was escorted by this RN via wheelchair to meet ride, husband.

## 2016-07-20 NOTE — Care Management Note (Signed)
Case Management Note  Patient Details  Name: Brandi Dickson MRN: [id number] Date of Birth: 03/08/1960  Subjective/Objective:                  Generalized weakness  Action/Plan: Date:  July 20, 2016 Chart reviewed for concurrent status and case management needs. Will continue to follow patient progress. Discharge Planning: following for needs Expected discharge date: [id number] Velva Harman, BSN, [place], [place] Expected Discharge Date:   (unknown)               Expected Discharge Plan:  Home/Self Care  In-House Referral:     Discharge planning Services  CM Consult  Post Acute Care Choice:    Choice offered to:     DME Arranged:    DME Agency:     HH Arranged:    [place] Agency:     Status of Service:  In process, will continue to follow  If discussed at Long Length of Stay Meetings, dates discussed:    Additional Comments:  Leeroy Cha, RN 07/20/2016, 10:44 AM

## 2016-07-20 NOTE — Discharge Summary (Signed)
Physician Discharge Summary  Brandi Dickson TOI:[id number] DOB: 1960/02/02 DOA: 07/19/2016  PCP: Orpah Melter, MD  Admit date: 07/19/2016 Discharge date: 07/20/2016  Admitted From: Home Disposition:  Home  Recommendations for Outpatient Follow-up:  1. Follow up with PCP in 1 week 2. Follow up with your oncologist [name] as previously scheduled 3. Please obtain BMP/CBC/Mg in 1 week  4. Please repeat TSH, free T4 in 4-6 weeks  5. Follow up on final urine culture results, and blood culture results, pending at time of discharge  Home Health: No  Equipment/Devices: None   Discharge Condition: Stable CODE STATUS: Full  Diet recommendation: Heart healthy   Brief/Interim Summary: Brandi Dickson is a 56 y.o. female with medical history significant of breast cancer she is on active chemotherapy, follows with [name]. She also has HTN, UC and DM. Patient came to the hospital because of generalized weakness. Patient received chemotherapy recently, patient noticed she had some nausea and loose stools which is improved by now that she started to have generalized weakness, feeling dizzy sometimes if she stands up very quickly, and had some palpitations. Patient came to the hospital for further evaluation. In the ED initial evaluation showed normal workup except leukopenia and lactic acid of 4.4.   She was treated with supportive care, IV fluids. Potassium and magnesium were replaced. There was questionable urinary tract infection and patient was treated empirically due to immunosuppressed state. On the of discharge, patient is tolerating meals, no longer weak, and no longer nauseous. She was feeling great and wanted to go home. Discussed with patient warning signs to look out for, which would warrant reevaluation in the hospital.  Discharge Diagnoses:  Active Problems:   Hypokalemia   Diabetes mellitus without complication (HCC)   Antineoplastic chemotherapy induced pancytopenia (HCC)    Lactic acidosis  Generalized weakness -This is likely secondary to dehydration and hypokalemia. -Improved   UTI, POA  -UA was unremarkable but urine culture with 30,000 GNR. Will empirically treat with cipro. Please follow up on the final culture and sensitivity. Blood culture is negative to date.  Hypokalemia -Replaced   Hypomagnesemia -Replaced  Elevated lactic acid -Resolved with IVF   Low TSH -Would repeat TSH and free T4 as outpatient in several weeks   Diabetes  -On metformin  Breast cancer status post chemotherapy -She just received chemotherapy last week      Discharge Instructions  Discharge Instructions    Call MD for:  difficulty breathing, headache or visual disturbances    Complete by:  As directed    Call MD for:  extreme fatigue    Complete by:  As directed    Call MD for:  persistant dizziness or light-headedness    Complete by:  As directed    Call MD for:  persistant nausea and vomiting    Complete by:  As directed    Call MD for:  severe uncontrolled pain    Complete by:  As directed    Call MD for:  temperature >100.4    Complete by:  As directed    Diet - low sodium heart healthy    Complete by:  As directed    Increase activity slowly    Complete by:  As directed      Allergies as of 07/20/2016      Reactions   Lisinopril Palpitations   Augmentin [amoxicillin-pot Clavulanate] Other (See Comments)   sts gives her a yeast infection Has patient had a PCN reaction  causing immediate rash, facial/tongue/throat swelling, SOB or lightheadedness with hypotension: no Has patient had a PCN reaction causing severe rash involving mucus membranes or skin necrosis: no Has patient had a PCN reaction that required hospitalization no Has patient had a PCN reaction occurring within the last 10 years: unknown If all of the above answers are "NO", then may proceed with Cephalosporin use.   Benadryl [diphenhydramine] Itching, Anxiety   Per pt: "Makes  my skin crawl"; makes pt sensitive to touch      Medication List    TAKE these medications   albuterol 108 (90 Base) MCG/ACT inhaler Commonly known as:  PROVENTIL HFA;VENTOLIN HFA Inhale 1-2 puffs into the lungs every 6 (six) hours as needed for wheezing or shortness of breath.   ciprofloxacin 500 MG tablet Commonly known as:  CIPRO Take 1 tablet (500 mg total) by mouth 2 (two) times daily.   dexamethasone 4 MG tablet Commonly known as:  DECADRON Take 1 tablet (4 mg total) by mouth daily. Start the day before Taxotere. Then again the day after chemo for 3 days. Notes to patient:  Resume as prior to admission.   dicyclomine 20 MG tablet Commonly known as:  BENTYL Take 1 tablet (20 mg total) by mouth 4 (four) times daily -  before meals and at bedtime.   diphenoxylate-atropine 2.5-0.025 MG tablet Commonly known as:  LOMOTIL Take one to two tablets every six hours as needed for diarrhea   ferrous sulfate 325 (65 FE) MG tablet Commonly known as:  FERROUSUL Take 1 tablet (325 mg total) by mouth 3 (three) times daily with meals. What changed:  when to take this   gabapentin 100 MG capsule Commonly known as:  NEURONTIN Take 200 mg by mouth at bedtime.   hydrocortisone 25 MG suppository Commonly known as:  ANUSOL-HC Place 1 suppository (25 mg total) rectally 2 (two) times daily.   lidocaine-prilocaine cream Commonly known as:  EMLA Apply 1 application to skin 1.5 to 2 hrs before use.  Cover to secure cream with plastic wrap.   loperamide 1 MG/5ML solution Commonly known as:  IMODIUM Take 3 mg by mouth as needed for diarrhea or loose stools.   LORazepam 0.5 MG tablet Commonly known as:  ATIVAN Take 1 tablet (0.5 mg total) by mouth once as needed for anxiety.   magnesium oxide 400 MG tablet Commonly known as:  MAG-OX Take 400 mg by mouth 2 (two) times daily.   Mesalamine 800 MG Tbec Commonly known as:  ASACOL HD Take 2 tablets (1,600 mg total) by mouth 3 (three) times  daily.   metFORMIN 500 MG 24 hr tablet Commonly known as:  GLUCOPHAGE-XR Take 1,000 mg by mouth at bedtime.   metoprolol tartrate 50 MG tablet Commonly known as:  LOPRESSOR Take 50 mg by mouth at bedtime.   niacin 250 MG tablet Take 250 mg by mouth at bedtime.   ondansetron 8 MG tablet Commonly known as:  ZOFRAN Take 1 tablet (8 mg total) by mouth 2 (two) times daily as needed for refractory nausea / vomiting. Start on day 3 after chemo.   Potassium 99 MG Tabs Take 1 tablet by mouth 2 (two) times daily.   predniSONE 20 MG tablet Commonly known as:  DELTASONE Take 30 mg by mouth daily.   prochlorperazine 10 MG tablet Commonly known as:  COMPAZINE Take 1 tablet (10 mg total) by mouth every 6 (six) hours as needed (Nausea or vomiting).   traMADol 50 MG tablet Commonly  known as:  ULTRAM Take 1-2 tablets (50-100 mg total) by mouth every 6 (six) hours as needed.   triamterene-hydrochlorothiazide 75-50 MG tablet Commonly known as:  MAXZIDE Take 0.5 tablets by mouth daily.   VITAMINS FOR THE HAIR Tabs Take 2 tablets by mouth 2 (two) times daily.      Follow-up Information    Orpah Melter, MD. Schedule an appointment as soon as possible for a visit in 1 week(s).   Specialty:  Family Medicine Why:  Repeat CBC, BMP, Mg in 1 week  Contact information: [place] [address] [phone]        Truitt Merle, MD. Go to.   Specialties:  Hematology, Oncology Contact information: [address]  [place] [address] [phone]          Allergies  Allergen Reactions  . Lisinopril Palpitations  . Augmentin [Amoxicillin-Pot Clavulanate] Other (See Comments)    sts gives her a yeast infection Has patient had a PCN reaction causing immediate rash, facial/tongue/throat swelling, SOB or lightheadedness with hypotension: no Has patient had a PCN reaction causing severe rash involving mucus membranes or skin necrosis: no Has patient had a  PCN reaction that required hospitalization no Has patient had a PCN reaction occurring within the last 10 years: unknown If all of the above answers are "NO", then may proceed with Cephalosporin use.   . Benadryl [Diphenhydramine] Itching and Anxiety    Per pt: "Makes my skin crawl"; makes pt sensitive to touch    Consultations:  None   Procedures/Studies: Dg Chest 2 View  Result Date: 07/19/2016 CLINICAL DATA:  Increasing weakness, shortness of Breath. History of breast cancer EXAM: CHEST  2 VIEW COMPARISON:  05/13/2016 FINDINGS: Right Port-A-Cath remains in place, unchanged. Heart and mediastinal contours are within normal limits. No focal opacities or effusions. No acute bony abnormality. IMPRESSION: No active cardiopulmonary disease. Electronically Signed   By: Rolm Baptise M.D.   On: 07/19/2016 10:30       Discharge Exam: Vitals:   07/19/16 2135 07/20/16 0458  BP: 114/66 108/66  Pulse: 97 100  Resp: 20 20  Temp: 98.3 F (36.8 C) 98.2 F (36.8 C)   Vitals:   07/19/16 1427 07/19/16 1638 07/19/16 2135 07/20/16 0458  BP: 139/78 112/65 114/66 108/66  Pulse:  (!) 102 97 100  Resp: 20 18 20 20   Temp:   98.3 F (36.8 C) 98.2 F (36.8 C)  TempSrc:   Oral Oral  SpO2: 100% 100% 100% 100%  Weight:      Height:  5' 4.5" (1.638 m)      General: Pt is alert, awake, not in acute distress Cardiovascular: RRR, S1/S2 +, no rubs, no gallops Respiratory: CTA bilaterally, no wheezing, no rhonchi Abdominal: Soft, NT, ND, bowel sounds + Extremities: no edema, no cyanosis    The results of significant diagnostics from this hospitalization (including imaging, microbiology, ancillary and laboratory) are listed below for reference.     Microbiology: Recent Results (from the past 240 hour(s))  Blood Culture (routine x 2)     Status: None (Preliminary result)   Collection Time: 07/19/16  9:51 AM  Result Value Ref Range Status   Specimen Description BLOOD RIGHT ARM  Final    Special Requests   Final    BOTTLES DRAWN AEROBIC AND ANAEROBIC Blood Culture adequate volume   Culture   Final    NO GROWTH 1 DAY Performed at [facility], [address], [place], [place] [address]    Report Status PENDING  Incomplete  Blood Culture (routine x 2)     Status: None (Preliminary result)   Collection Time: 07/19/16 10:15 AM  Result Value Ref Range Status   Specimen Description BLOOD RIGHT ANTECUBITAL  Final   Special Requests   Final    BOTTLES DRAWN AEROBIC AND ANAEROBIC Blood Culture adequate volume   Culture   Final    NO GROWTH 1 DAY Performed at [facility], [place] [address], [place], [place] [address]    Report Status PENDING  Incomplete  Urine culture     Status: Abnormal (Preliminary result)   Collection Time: 07/19/16 12:38 PM  Result Value Ref Range Status   Specimen Description URINE, RANDOM  Final   Special Requests NONE  Final   Culture 30,000 COLONIES/mL GRAM NEGATIVE RODS (A)  Final   Report Status PENDING  Incomplete     Labs: BNP (last 3 results)  Recent Labs  05/13/16 1431  BNP 14.4   Basic Metabolic Panel:  Recent Labs Lab 07/19/16 0952 07/19/16 1005 07/20/16 0420  NA 134* 134* 142  K 3.3* 3.3* 3.5  CL 97* 97* 110  CO2 23  --  26  GLUCOSE 236* 238* 125*  BUN 20 19 12   CREATININE 0.75 0.60 0.62  CALCIUM 8.7*  --  7.8*  MG  --   --  1.3*   Liver Function Tests:  Recent Labs Lab 07/19/16 0952  AST 25  ALT 27  ALKPHOS 81  BILITOT 0.6  PROT 6.8  ALBUMIN 3.6   No results for input(s): LIPASE, AMYLASE in the last 168 hours. No results for input(s): AMMONIA in the last 168 hours. CBC:  Recent Labs Lab 07/19/16 0950 07/19/16 1005 07/20/16 0420  WBC 2.2*  --  2.4*  NEUTROABS 1.3*  --   --   HGB 11.8* 12.2 8.4*  HCT 35.4* 36.0 25.1*  MCV 92.9  --  93.3  PLT 151  --  94*   Cardiac Enzymes: No results for input(s): CKTOTAL, CKMB, CKMBINDEX, TROPONINI in the last 168 hours. BNP: Invalid input(s):  POCBNP CBG:  Recent Labs Lab 07/19/16 1006 07/19/16 1742 07/19/16 2246 07/20/16 0730 07/20/16 1140  GLUCAP 260* 274* 146* 109* 184*   D-Dimer No results for input(s): DDIMER in the last 72 hours. Hgb A1c  Recent Labs  07/19/16 1839  HGBA1C 6.6*   Lipid Profile No results for input(s): CHOL, HDL, LDLCALC, TRIG, CHOLHDL, LDLDIRECT in the last 72 hours. Thyroid function studies  Recent Labs  07/19/16 1839  TSH 0.321*   Anemia work up No results for input(s): VITAMINB12, FOLATE, FERRITIN, TIBC, IRON, RETICCTPCT in the last 72 hours. Urinalysis    Component Value Date/Time   COLORURINE STRAW (A) 07/19/2016 1238   APPEARANCEUR CLEAR 07/19/2016 1238   LABSPEC 1.006 07/19/2016 1238   PHURINE 5.0 07/19/2016 1238   GLUCOSEU >=500 (A) 07/19/2016 1238   HGBUR NEGATIVE 07/19/2016 1238   BILIRUBINUR NEGATIVE 07/19/2016 1238   KETONESUR 5 (A) 07/19/2016 1238   PROTEINUR NEGATIVE 07/19/2016 1238   UROBILINOGEN 0.2 12/30/2008 1317   NITRITE POSITIVE (A) 07/19/2016 1238   LEUKOCYTESUR NEGATIVE 07/19/2016 1238   Sepsis Labs Invalid input(s): PROCALCITONIN,  WBC,  LACTICIDVEN Microbiology Recent Results (from the past 240 hour(s))  Blood Culture (routine x 2)     Status: None (Preliminary result)   Collection Time: 07/19/16  9:51 AM  Result Value Ref Range Status   Specimen Description  BLOOD RIGHT ARM  Final   Special Requests   Final    BOTTLES DRAWN AEROBIC AND ANAEROBIC Blood Culture adequate volume   Culture   Final    NO GROWTH 1 DAY Performed at [facility], [address] [place], [place] [address]    Report Status PENDING  Incomplete  Blood Culture (routine x 2)     Status: None (Preliminary result)   Collection Time: 07/19/16 10:15 AM  Result Value Ref Range Status   Specimen Description BLOOD RIGHT ANTECUBITAL  Final   Special Requests   Final    BOTTLES DRAWN AEROBIC AND ANAEROBIC Blood Culture adequate volume   Culture   Final    NO GROWTH 1  DAY Performed at [facility], [place] [address] [place], [place] [address]    Report Status PENDING  Incomplete  Urine culture     Status: Abnormal (Preliminary result)   Collection Time: 07/19/16 12:38 PM  Result Value Ref Range Status   Specimen Description URINE, RANDOM  Final   Special Requests NONE  Final   Culture 30,000 COLONIES/mL GRAM NEGATIVE RODS (A)  Final   Report Status PENDING  Incomplete     Time coordinating discharge: 45 minutes  SIGNED:  Dessa Phi, DO [facility] Pager [phone]  If 7PM-7AM, please contact night-coverage [email or web address] Password [facility] 07/20/2016, 2:19 PM

## 2016-07-20 NOTE — Progress Notes (Signed)
Date: July 20, 2016 Chart reviewed for discharge orders: None found for case management. Vernia Buff, [phone]

## 2016-07-20 NOTE — Telephone Encounter (Signed)
Injection added for 08/06/16, per 07/13/16 los. Patient is hospitalized.  Appointment letter and schedule mailed to patient, per 07/13/16 los.

## 2016-07-21 ENCOUNTER — Encounter (HOSPITAL_COMMUNITY): Payer: Self-pay

## 2016-07-21 ENCOUNTER — Ambulatory Visit (HOSPITAL_COMMUNITY)
Admission: RE | Admit: 2016-07-21 | Discharge: 2016-07-21 | Disposition: A | Discharge status: Home / Self Care | Payer: 59 | Source: Ambulatory Visit | Attending: Hematology | Admitting: Hematology

## 2016-07-21 LAB — URINE CULTURE

## 2016-07-24 LAB — CULTURE, BLOOD (ROUTINE X 2)
CULTURE: NO GROWTH
Culture: NO GROWTH
Special Requests: ADEQUATE
Special Requests: ADEQUATE

## 2016-07-27 DIAGNOSIS — R5383 Other fatigue: Secondary | ICD-10-CM | POA: Diagnosis not present

## 2016-07-27 DIAGNOSIS — D72819 Decreased white blood cell count, unspecified: Secondary | ICD-10-CM | POA: Diagnosis not present

## 2016-07-30 ENCOUNTER — Ambulatory Visit (HOSPITAL_COMMUNITY): Admission: RE | Admit: 2016-07-30 | Payer: 59 | Source: Ambulatory Visit

## 2016-07-30 ENCOUNTER — Encounter (HOSPITAL_COMMUNITY): Payer: Self-pay

## 2016-07-30 NOTE — Progress Notes (Signed)
[place]  [phone] [phone]  Clinic Follow Up Note   Patient Care Team: Orpah Melter, MD as PCP - General (Family Medicine) [name], MD as Consulting Physician (General Surgery) Truitt Merle, MD as Consulting Physician (Hematology) Gery Pray, MD as Consulting Physician (Radiation Oncology) Ladene Artist, MD as Consulting Physician (Gastroenterology) 08/03/2016  CHIEF COMPLAINTS:  Follow up left breast cancer  Oncology History   Cancer Staging Breast cancer of upper-outer quadrant of left female breast [facility]) Staging form: Breast, AJCC 8th Edition - Clinical stage from 04/25/2016: Stage IIA (cT2, cN0, cM0, G3, ER: Negative, PR: Negative, HER2: Positive) - Signed by Truitt Merle, MD on 05/02/2016       Breast cancer of upper-outer quadrant of left female breast ([place])   04/24/2016 Mammogram    Category B breasts with a new irregular mass in the UOQ left breast middle depth. Ultrasound revealed a 3.9 cm mass in the 1:00 position. The left axilla was negative.       04/25/2016 Initial Biopsy    Biopsy of the left breast showed grade 3 invasive ductal carcinoma, DCIS, and lymphovascular invasion was present.      04/25/2016 Receptors her2    ER 0% negative, PR 0% negative, HER2 positive, Ki67 30%      05/02/2016 Initial Diagnosis    Breast cancer of upper-outer quadrant of left female breast ([place])     05/07/2016 Imaging    MRI of the bilateral breast 05/07/16 IMPRESSION: Lobulated enhancing mass (4.0 x 2.7 x 3.1 cm) in the upper-outer quadrant of the left breast corresponding with the recently diagnosed invasive mammary carcinoma. Linear enhancement extends 2.6 cm posterior to the mass worrisome for ductal carcinoma in-situ.      05/09/2016 Echocardiogram    Echo 05/09/16 -LF EF: 55-60%      05/11/2016 -  Neo-Adjuvant Chemotherapy    Docetaxel, Carboplatin, Herceptin and pejeta (TCHP) every 3 weeks, for total of 6 cycles,  followed by Herceptin and perjeta maintenance therapy to complete 1 year treatment   starting 05/11/16; Docetaxel changed to Taxol, carbo dose reduced to AUC 4.5 (from 5)  from cycle 2 due to severe diarrhea and cytopenia, on 06/01/16. Will hold Perjeta and add herceptin for cycle 3  Due to poor toleration we may proceed with weekly taxol starting 08/10/16 Start Herceptin every 3 weeks for the next year starting 08/03/16       05/13/2016 Imaging    CT Angio Chest PE IMPRESSION: No evidence of pulmonary emboli. Left breast mass consistent with the given clinical history. Stable left adrenal lesion likely representing a small adenoma.      05/18/2016 - 05/24/2016 Hospital Admission    Patient presented with nausea, vomiting, and diarrhea; admitted to hospital with Hyponatremia      06/07/2016 - 06/09/2016 Hospital Admission    Patient presents to hospital complaints of rectal bleeding and abdominal cramps when defacating      06/07/2016 Imaging    CT ABDOMEN PELVIS W CONTRAST  IMPRESSION: 1. Wall thickening of the descending and sigmoid colon consistent with an infectious or inflammatory colitis. 2. There is also a short segment of narrowing and possible wall thickening of the superior ascending colon which could reflect a constricting mass or, more likely, be an area of persistent colonic spasm. This could be further assessed with either colonoscopy or a barium enema after the current symptoms of colitis have resolved. 3. Small, subcentimeter, low-density liver lesions which may all be benign.  However, 3 these are not evident on prior CT. Liver metastatic disease possible. These could be further assessed with liver MRI with and without contrast. 4. Adrenal lesions which are stable, on the right and myelolipoma and on the left most likely an adenoma.      06/07/2016 Imaging    CT A/P IMPRESSION: 1. Wall thickening of the descending and sigmoid colon consistent with an infectious or  inflammatory colitis. 2. There is also a short segment of narrowing and possible wall thickening of the superior ascending colon which could reflect a constricting mass or, more likely, be an area of persistent colonic spasm. This could be further assessed with either colonoscopy or a barium enema after the current symptoms of colitis have resolved. 3. Small, subcentimeter, low-density liver lesions which may all be benign. However, 3 these are not evident on prior CT. Liver metastatic disease possible. These could be further assessed with liver MRI with and without contrast. 4. Adrenal lesions which are stable, on the right and myelolipoma and on the left most likely an adenoma.      06/07/2016 - 06/09/2016 Hospital Admission    Diarrhea and rectal Bleeding      07/19/2016 - 07/20/2016 Hospital Admission    Diarrhea and dehydration       HISTORY OF PRESENTING ILLNESS (05/02/16):  Brandi Dickson 56 y.o. female is here because of a new diagnosis of left breast cancer. She is accompanied by her husband to our multidisciplinary breast clinic today.  The patient presented with a palpable left breast lump approximately 2 weeks ago. Bilateral diagnostic mammogram on 04/24/16 showed Category B breasts with a new irregular mass in the UOQ left breast middle depth. Ultrasound performed on 04/24/16 revealed a 3.9 cm mass in the 1:00 position. The left axilla was negative.  Biopsy of the left breast on 04/25/16 showed grade 3 invasive ductal carcinoma, DCIS, and lymphovascular invasion was present (ER 0% negative, PR 0% negative, HER2 positive, Ki67 30%).  She denies tenderness of the biopsied area. Reports minor dimpling. The patient is 8 weeks out from a left hip replacement. She had a right hip replacement 2010 years ago.  The patient and her husband present today in multidisciplinary breast clinic to discuss treatment options for the management of her disease.  The patient is taking Neurontin  for neuropathy in her feet from diabetes. She has been diagnosed for diabetes for the past 4 years. She states she only has neuropathy at night and take 2 Neurontin at night and then has no symptoms.  GYN HISTORY  Menarchal: 11 LMP: Complete hysterectomy for uterine fibroids ~ 2009. She was bleeding too much with her last menstrual cycle lasting 6 weeks. No issues with menopause. Contraceptive: no  HRT: No GP: G1P0  CURRENT THERAPY: Docetaxel, Carboplatin, Herceptin and pejeta (TCHP) every 3 weeks, for total of 6 cycles, followed by Herceptin and perjeta maintenance therapy to complete 1 year treatment starting 05/11/16; Docetaxel changed to Taxol, carbo dose reduced to AUC 4.5 (from 5)  from cycle 2 due to severe diarrhea and cytopenia, on 06/01/16. Will hold Perjeta from cycle 3. Stop TCHP due to poor toleration and Start Herceptin for every 3 weeks for the next year on 08/03/16  INTERVAL HISTORY:  Brandi Dickson returns for follow up. She presents to the clinic today with her husband. She reports her last stay in the hospital was short due to dehydration. She was very nauseous and her diarrhea was not bad. She now  has solid stool twice a day. She takes Lomotil as needed. Her appetite has improved as well.  She has been to hospital after every cycle except cycle 3.  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 She has numbness and tingling and it is hard to pick up things and her hand writing has worsened. Neuropathy started with her last cycle and it is effecting her right hand digits 1-4. She has neuropathy on her feet previous to chemo due to DM. Her feet neuropathy has worsened since chemo.   She has her MRI next Friday.     MEDICAL HISTORY:  Past Medical History:  Diagnosis Date  . Arthritis   . Asthma    triggered with [name] perfumes and cigarette smoke  . Cancer ([place])   . Colitis   . Diabetes mellitus without complication ([place])   .  Hypertension   . Neuropathy     SURGICAL HISTORY: Past Surgical History:  Procedure Laterality Date  . ABDOMINAL HYSTERECTOMY    . DILATION AND CURETTAGE OF UTERUS    . PORTACATH PLACEMENT Right 05/08/2016   Procedure: INSERTION PORT-A-CATH WITH [place];  Surgeon: [name], MD;  Location: [facility];  Service: General;  Laterality: Right;  . TONSILLECTOMY    . TOTAL HIP ARTHROPLASTY Right   . TOTAL HIP ARTHROPLASTY Left 03/06/2016   Procedure: LEFT TOTAL HIP ARTHROPLASTY ANTERIOR APPROACH;  Surgeon: [name], MD;  Location: [facility];  Service: Orthopedics;  Laterality: Left;    SOCIAL HISTORY: Social History   Social History  . Marital status: Married    Spouse name: N/A  . Number of children: N/A  . Years of education: N/A   Occupational History  . Not on file.   Social History Main Topics  . Smoking status: Former Smoker    Packs/day: 1.00    Years: 29.00    Types: Cigarettes    Quit date: 10/27/2002  . Smokeless tobacco: Never Used  . Alcohol use No  . Drug use: No  . Sexual activity: Yes    Birth control/ protection: Surgical   Other Topics Concern  . Not on file   Social History Narrative  . No narrative on file    FAMILY HISTORY: Family History  Problem Relation Age of Onset  . Colon cancer Father   . Stomach cancer Paternal Uncle   . Stomach cancer Paternal Uncle   . Melanoma Brother   . Thyroid cancer Brother   . Breast cancer Maternal Aunt   . Breast cancer Maternal Aunt   . Breast cancer Cousin     ALLERGIES:  is allergic to lisinopril; augmentin [amoxicillin-pot clavulanate]; and benadryl [diphenhydramine].  MEDICATIONS:  Current Outpatient Prescriptions  Medication Sig Dispense Refill  . albuterol (PROVENTIL HFA;VENTOLIN HFA) 108 (90 Base) MCG/ACT inhaler Inhale 1-2 puffs into the lungs every 6 (six) hours as needed for wheezing or shortness of breath.    . dexamethasone (DECADRON) 4 MG tablet Take 1 tablet (4 mg total) by  mouth daily. Start the day before Taxotere. Then again the day after chemo for 3 days. (Patient not taking: Reported on 07/13/2016) 30 tablet 1  . dicyclomine (BENTYL) 20 MG tablet Take 1 tablet (20 mg total) by mouth 4 (four) times daily -  before meals and at bedtime. 30 tablet 0  . diphenoxylate-atropine (LOMOTIL) 2.5-0.025 MG tablet Take one to two tablets every six hours as needed for diarrhea (Patient not  taking: Reported on 07/13/2016) 120 tablet 1  . ferrous sulfate (FERROUSUL) 325 (65 FE) MG tablet Take 1 tablet (325 mg total) by mouth 3 (three) times daily with meals. (Patient taking differently: Take 325 mg by mouth 2 (two) times daily with a meal. )    . gabapentin (NEURONTIN) 100 MG capsule Take 200 mg by mouth at bedtime.     . hydrocortisone (ANUSOL-HC) 25 MG suppository Place 1 suppository (25 mg total) rectally 2 (two) times daily. (Patient not taking: Reported on 07/13/2016) 12 suppository 0  . lidocaine-prilocaine (EMLA) cream Apply 1 application to skin 1.5 to 2 hrs before use.  Cover to secure cream with plastic wrap. 30 g PRN  . loperamide (IMODIUM) 1 MG/5ML solution Take 3 mg by mouth as needed for diarrhea or loose stools.    [name] Kitchen LORazepam (ATIVAN) 0.5 MG tablet Take 1 tablet (0.5 mg total) by mouth once as needed for anxiety. 2 tablet 0  . magnesium oxide (MAG-OX) 400 MG tablet Take 400 mg by mouth 2 (two) times daily.    . Mesalamine (ASACOL HD) 800 MG TBEC Take 2 tablets (1,600 mg total) by mouth 3 (three) times daily. 90 tablet 5  . metFORMIN (GLUCOPHAGE-XR) 500 MG 24 hr tablet Take 1,000 mg by mouth at bedtime.    . metoprolol (LOPRESSOR) 50 MG tablet Take 50 mg by mouth at bedtime.     . niacin 250 MG tablet Take 250 mg by mouth at bedtime.     . ondansetron (ZOFRAN) 8 MG tablet Take 1 tablet (8 mg total) by mouth 2 (two) times daily as needed for refractory nausea / vomiting. Start on day 3 after chemo. 30 tablet 1  . Potassium 99 MG TABS Take 1 tablet by mouth 2 (two) times  daily.    . potassium chloride SA (K-DUR,KLOR-CON) 20 MEQ tablet Take 1 tablet (20 mEq total) by mouth 2 (two) times daily. 60 tablet 1  . predniSONE (DELTASONE) 20 MG tablet Take 30 mg by mouth daily.     . prochlorperazine (COMPAZINE) 10 MG tablet Take 1 tablet (10 mg total) by mouth every 6 (six) hours as needed (Nausea or vomiting). 30 tablet 1  . Specialty Vitamins Products (VITAMINS FOR THE HAIR) TABS Take 2 tablets by mouth 2 (two) times daily.    . traMADol (ULTRAM) 50 MG tablet Take 1-2 tablets (50-100 mg total) by mouth every 6 (six) hours as needed. 40 tablet 0  . triamterene-hydrochlorothiazide (MAXZIDE) 75-50 MG per tablet Take 0.5 tablets by mouth daily.      No current facility-administered medications for this visit.    Facility-Administered Medications Ordered in Other Visits  Medication Dose Route Frequency Provider Last Rate Last Dose  . heparin lock flush 100 unit/mL  500 Units Intracatheter Once PRN Truitt Merle, MD      . sodium chloride flush (NS) 0.9 % injection 10 mL  10 mL Intracatheter PRN Truitt Merle, MD        REVIEW OF SYSTEMS:   Constitutional: Denies fevers, chills or abnormal night sweats Eyes: Denies blurriness of vision, double vision or watery eyes Ears, nose, mouth, throat, and face: Denies mucositis or sore throat Respiratory: Denies cough, dyspnea or wheezes Cardiovascular: Denies palpitation, chest discomfort or lower extremity swelling Gastrointestinal:  Denies heartburn (+) diarrhea improved (+) nausea  Skin: Denies abnormal skin rashes Lymphatics: Denies new lymphadenopathy or easy bruising Neurological: (+) neuropathy in hands Behavioral/Psych: Mood is stable, no new changes  All other systems  were reviewed with the patient and are negative.  PHYSICAL EXAMINATION:  ECOG PERFORMANCE STATUS: 1 - Symptomatic but completely ambulatory  Vitals:   08/03/16 0810  BP: 126/67  Pulse: 81  Resp: 18  Temp: 97.8 F (36.6 C)   Filed Weights   08/03/16  0810  Weight: 201 lb 14.4 oz (91.6 kg)    GENERAL:alert, no distress and comfortable SKIN: skin color, texture, turgor are normal, no rashes or significant lesions EYES: normal, conjunctiva are pink and non-injected, sclera clear OROPHARYNX:no exudate, no erythema and lips, buccal mucosa, and tongue normal  NECK: supple, thyroid normal size, non-tender, without nodularity LYMPH:  no palpable lymphadenopathy in the cervical, axillary or inguinal LUNGS: clear to auscultation and percussion with normal breathing effort HEART: regular rate & rhythm and no murmurs and no lower extremity edema ABDOMEN:abdomen soft, non-tender and normal bowel sounds Musculoskeletal:no cyanosis of digits and no clubbing  PSYCH: alert & oriented x 3 with fluent speech NEURO: no focal motor/sensory deficits BREAST: Breast inspection showed them to be symmetrical with no nipple discharge. Left breast exam showed UOQ 2 x 2.5 cm reduced to 2 x 1.5 cm palpable mass, non tender, and smaller in size than previous exam. No palpable mass in the right breast and bilateral axilla.  LABORATORY DATA:  I have reviewed the data as listed CBC Latest Ref Rng & Units 08/03/2016 07/20/2016 07/19/2016  WBC 3.9 - 10.3 10e3/uL 8.0 2.4(L) -  Hemoglobin 11.6 - 15.9 g/dL 10.7(L) 8.4(L) 12.2  Hematocrit 34.8 - 46.6 % 33.6(L) 25.1(L) 36.0  Platelets 145 - 400 10e3/uL 107(L) 94(L) -   CMP Latest Ref Rng & Units 08/03/2016 07/20/2016 07/19/2016  Glucose 70 - 140 mg/dl 137 125(H) 238(H)  BUN 7.0 - 26.0 mg/dL 18.5 12 19   Creatinine 0.6 - 1.1 mg/dL 0.7 0.62 0.60  Sodium 136 - 145 mEq/L 139 142 134(L)  Potassium 3.5 - 5.1 mEq/L 2.9(LL) 3.5 3.3(L)  Chloride 101 - 111 mmol/L - 110 97(L)  CO2 22 - 29 mEq/L 28 26 -  Calcium 8.4 - 10.4 mg/dL 9.2 7.8(L) -  Total Protein 6.4 - 8.3 g/dL 6.6 - -  Total Bilirubin 0.20 - 1.20 mg/dL 0.44 - -  Alkaline Phos 40 - 150 U/L 83 - -  AST 5 - 34 U/L 14 - -  ALT 0 - 55 U/L 20 - -   PATHOLOGY REPORT:    ADDITIONAL INFORMATION: 04/25/16 PROGNOSTIC INDICATORS Results: IMMUNOHISTOCHEMICAL AND MORPHOMETRIC ANALYSIS PERFORMED MANUALLY Estrogen Receptor: 0%, NEGATIVE Progesterone Receptor: 0%, NEGATIVE Proliferation Marker Ki67: 30% COMMENT: The negative hormone receptor study(ies) in this case has no internal positive control. REFERENCE RANGE ESTROGEN RECEPTOR NEGATIVE 0% POSITIVE =>1% REFERENCE RANGE PROGESTERONE RECEPTOR NEGATIVE 0% POSITIVE =>1% All controls stained appropriately Enid Cutter MD Pathologist, Electronic Signature ( Signed 05/01/2016) FLUORESCENCE IN-SITU HYBRIDIZATION Results: HER2 - **POSITIVE** RATIO OF HER2/CEP17 SIGNALS 2.25 AVERAGE HER2 COPY NUMBER PER CELL 8.45 1 of 3 FINAL for Bellot, Donie P ([id number]) ADDITIONAL INFORMATION:(continued) Reference Range: NEGATIVE HER2/CEP17 Ratio <2.0 and average HER2 copy number <4.0 EQUIVOCAL HER2/CEP17 Ratio <2.0 and average HER2 copy number 4.0 and <6.0 POSITIVE HER2/CEP17 Ratio >=2.0 or <2.0 and average HER2 copy number >=6.0 Enid Cutter MD Pathologist, Electronic Signature ( Signed 04/30/2016) FINAL DIAGNOSIS Diagnosis Breast, left, needle core biopsy - INVASIVE DUCTAL CARCINOMA, SEE COMMENT. - DUCTAL CARCINOMA IN SITU. - LYMPHOVASCULAR INVASION PRESENT. Microscopic Comment The carcinoma appears grade 3 with focal squamous differentiation. Prognostic markers will be ordered. Dr. Lyndon Code has reviewed the case.  The case was called to Dr. Isaiah Blakes on 04/26/2016. Vicente Males MD Pathologist, Electronic Signature (Case signed 04/26/2016)  RADIOGRAPHIC STUDIES: I have personally reviewed the radiological images as listed and agreed with the findings in the report.  ABD MRI 07/30/16 PENDING    See onc history CT ABD/Pelvi 5/10/18s W CONTRAST 06/07/16 IMPRESSION: 1. Wall thickening of the descending and sigmoid colon consistent with an infectious or inflammatory colitis. 2. There is also a short segment of  narrowing and possible wall thickening of the superior ascending colon which could reflect a constricting mass or, more likely, be an area of persistent colonic spasm. This could be further assessed with either colonoscopy or a barium enema after the current symptoms of colitis have resolved. 3. Small, subcentimeter, low-density liver lesions which may all be benign. However, 3 these are not evident on prior CT. Liver metastatic disease possible. These could be further assessed with liver MRI with and without contrast. 4. Adrenal lesions which are stable, on the right and myelolipoma and on the left most likely an adenoma.  CT Angio Chest PE W and/or wo Contrast 05/13/16 IMPRESSION: No evidence of pulmonary emboli.  Echo 05/09/16 Study Conclusions -LF EF: 55-60% - Left ventricle: The cavity size was normal. There was mild focal   basal hypertrophy of the septum. Indeterminant diastolic   function. Systolic function was normal. The estimated ejection   fraction was in the range of 55% to 60%. Wall motion was normal;   there were no regional wall motion abnormalities. GLS abnormal at   -13.3%, poor images however.  ASSESSMENT & PLAN: 56 y.o. post-menopausal Caucasian female with a self palpated left breast mass.  1. Breast cancer of upper-outer quadrant of left breast, invasive ductal carcinoma,  stage IIA (cT2N0M0) grade 3, ER-, PR-, HER2 amplified -We previously reviewed the patient's imaging and pathology. -We previously reviewed her staging and biology of her breast cancer  -We previously discussed that surgical resection is the definitive treatment for breast cancer, she was seen by breast surgeon Dr. Lucia Gaskins today, lumpectomy versus mastectomy were discussed with patient. -We previously discussed HER2 positive breast cancers total approximately 15% of breast cancers and happens to be more aggressive than HER2 negative cancers, especially ER and PR negative disease, she has high  likelihood of cancer recurrence after complete surgical resection.  -I recommend neoadjuvant chemotherapy TCHP (docetaxel, carboplatin, Herceptin and pejeta) every 3 weeks for 6 cycles, followed by maintenance Herceptin and pejeta to complete 1 year therapy -Her baseline echo was normal, she was seen by cardiologist Dr. Benjamine Mola -She unfortunately developed severe diarrhea and cytopenia after first cycle chemotherapy, was hospitalized for a week. She has now recovered well. -cycle 2 chemo docetaxel was changed to paclitaxel, carbo and pejeta dose was reduced, unfortunately she again developed worsening diarrhea and bleeding, required a few days hospitalization. -Perjeta has been held from cycle 3, she tolerated cycle 3 with carboplatin, paclitaxel and Herceptin well but developed dyspnea and was seen by PCP  -She again was admitted to hospital for dehydration and diarrhea after cycle 4 chemotherapy -Due to her frequent hospitalization from chemotherapy-related complications, and she has received 4 cycles of chemo (planed for 6 cycles), I will stop her current chemo therapy today and continue herceptin - I suggest she gets a [place] and mammogram to assess her response to chemo then decided if 1-2 more month of taxol is needed, or change to maintenance Herceptin every 3 weeks and possibily include Perjeta.  -She has developed a  mild neuropathy from chemotherapy, she has baseline diabetic neuropathy on her feet, I do not think she can tolerate Taxol very long. -Pt is due for Liver MRI and ECHO this month -Today we will do Herceptin every 3 weeks and if she tolerates well we will add perjeta next cycle.  -Labs reviewed and counts improved since hospitalization.   2. Genetics -Given her strong family history of breast cancer, we recommend her to see genetic counseling to ruled out inheritable breast cancer syndrome. She agreed. -Genetic counseling scheduled  3. Type 2 Diabetes mellitus, HTN -Managed by her  PCP. -The patient has peripheral neuropathy in her feet from her diabetes. The patient is already on Neurontin with 200 mg at night. We discussed that chemotherapy may make her neuropathy worse. -Steroids will be given to reduce chemo side effects and I will reduce dexa to 28m daily to not affect her blood sugar much. -We'll monitor her blood glucose and blood pressure closely during her chemotherapy treatment.  4. Ulcerative colitis -We previously discussed that chemotherapy would cause diarrhea and the patient's colitis may exacerbate during chemo  -She is not taking steroids for this. She is on mesalamine, will follow up with Dr. SFuller Plan -She had severe diarrhea after first cycle chemotherapy, was treated for ulcerative colitis flare, she will follow-up with Dr. SFuller Plannext week. -I previously advised the patient to keep herself adequatly hydrated and to take Imodium PRN. - I again previously advised the patient to take Lomotil and Imodium frequently as needed.  5. Arthritis  -s/p b/l hip replacement, last surgery in February 2018.   PLAN -We'll stop carboplatin and Taxol today, and proceed with Herceptin alone today, and continue Herceptin every 3 weeks, possible resume pejeta in the near future if diarrhea resolves  -Mammogram and [place]oreain one week then decide if change her chemo to weekly taxol for 4-6 weeks  -will schedule Lab, f/u flush and weekly taxol next Friday  -Patient is scheduled for abdominal MRI to evaluate her liver lesion next week.   Orders Placed This Encounter  Procedures  . MM DIAG BREAST TOMO UNI LEFT    Standing Status:   Future    Standing Expiration Date:   08/03/2017    Scheduling Instructions:     [facility]    Order Specific Question:   Reason for Exam (SYMPTOM  OR DIAGNOSIS REQUIRED)    Answer:   interim evaluation of left breast mass    Order Specific Question:   Is the patient pregnant?    Answer:   No    Order Specific Question:   Preferred imaging location?     Answer:   External  . [place]AST LTD UNI LEFT INC AXILLA    Standing Status:   Future    Standing Expiration Date:   10/04/2017    Scheduling Instructions:     [facility]    Order Specific Question:   Reason for Exam (SYMPTOM  OR DIAGNOSIS REQUIRED)    Answer:   restaging, chemo response evaluation    Order Specific Question:   Preferred imaging location?    Answer:   External    All questions were answered. The patient knows to call the clinic with any problems, questions or concerns.  I spent 30 minutes counseling the patient face to face. The total time spent in the appointment was 40 minutes and more than 50% was on counseling.  This document serves as a record of services personally performed by YTruitt Merle  MD. It was created on her behalf by Joslyn Devon, a trained medical scribe. The creation of this record is based on the scribe's personal observations and the provider's statements to them. This document has been checked and approved by the attending provider.    Truitt Merle, MD 08/03/2016

## 2016-08-03 ENCOUNTER — Other Ambulatory Visit (HOSPITAL_BASED_OUTPATIENT_CLINIC_OR_DEPARTMENT_OTHER): Payer: 59

## 2016-08-03 ENCOUNTER — Ambulatory Visit (HOSPITAL_BASED_OUTPATIENT_CLINIC_OR_DEPARTMENT_OTHER): Payer: 59 | Admitting: Hematology

## 2016-08-03 ENCOUNTER — Telehealth: Payer: Self-pay | Admitting: Gastroenterology

## 2016-08-03 ENCOUNTER — Ambulatory Visit (HOSPITAL_BASED_OUTPATIENT_CLINIC_OR_DEPARTMENT_OTHER): Payer: 59

## 2016-08-03 ENCOUNTER — Ambulatory Visit: Payer: 59

## 2016-08-03 ENCOUNTER — Telehealth: Payer: Self-pay | Admitting: Hematology

## 2016-08-03 ENCOUNTER — Encounter: Payer: Self-pay | Admitting: *Deleted

## 2016-08-03 VITALS — BP 126/67 | HR 81 | Temp 97.8°F | Resp 18 | Ht 64.5 in | Wt 201.9 lb

## 2016-08-03 DIAGNOSIS — C50412 Malignant neoplasm of upper-outer quadrant of left female breast: Secondary | ICD-10-CM

## 2016-08-03 DIAGNOSIS — Z5112 Encounter for antineoplastic immunotherapy: Secondary | ICD-10-CM | POA: Diagnosis not present

## 2016-08-03 DIAGNOSIS — K5190 Ulcerative colitis, unspecified, without complications: Secondary | ICD-10-CM

## 2016-08-03 DIAGNOSIS — Z171 Estrogen receptor negative status [ER-]: Secondary | ICD-10-CM | POA: Diagnosis not present

## 2016-08-03 DIAGNOSIS — E119 Type 2 diabetes mellitus without complications: Secondary | ICD-10-CM

## 2016-08-03 DIAGNOSIS — I10 Essential (primary) hypertension: Secondary | ICD-10-CM | POA: Diagnosis not present

## 2016-08-03 DIAGNOSIS — K51311 Ulcerative (chronic) rectosigmoiditis with rectal bleeding: Secondary | ICD-10-CM

## 2016-08-03 LAB — CBC WITH DIFFERENTIAL/PLATELET
BASO%: 0.1 % (ref 0.0–2.0)
Basophils Absolute: 0 10*3/uL (ref 0.0–0.1)
EOS ABS: 0 10*3/uL (ref 0.0–0.5)
EOS%: 0 % (ref 0.0–7.0)
HEMATOCRIT: 33.6 % — AB (ref 34.8–46.6)
HEMOGLOBIN: 10.7 g/dL — AB (ref 11.6–15.9)
LYMPH#: 2.7 10*3/uL (ref 0.9–3.3)
LYMPH%: 33.4 % (ref 14.0–49.7)
MCH: 31.6 pg (ref 25.1–34.0)
MCHC: 31.8 g/dL (ref 31.5–36.0)
MCV: 99.1 fL (ref 79.5–101.0)
MONO#: 0.7 10*3/uL (ref 0.1–0.9)
MONO%: 8.7 % (ref 0.0–14.0)
NEUT%: 57.8 % (ref 38.4–76.8)
NEUTROS ABS: 4.6 10*3/uL (ref 1.5–6.5)
Platelets: 107 10*3/uL — ABNORMAL LOW (ref 145–400)
RBC: 3.39 10*6/uL — ABNORMAL LOW (ref 3.70–5.45)
RDW: 18.7 % — ABNORMAL HIGH (ref 11.2–14.5)
WBC: 8 10*3/uL (ref 3.9–10.3)
nRBC: 0 % (ref 0–0)

## 2016-08-03 LAB — COMPREHENSIVE METABOLIC PANEL
ALBUMIN: 3.2 g/dL — AB (ref 3.5–5.0)
ALK PHOS: 83 U/L (ref 40–150)
ALT: 20 U/L (ref 0–55)
AST: 14 U/L (ref 5–34)
Anion Gap: 11 mEq/L (ref 3–11)
BILIRUBIN TOTAL: 0.44 mg/dL (ref 0.20–1.20)
BUN: 18.5 mg/dL (ref 7.0–26.0)
CALCIUM: 9.2 mg/dL (ref 8.4–10.4)
CHLORIDE: 100 meq/L (ref 98–109)
CO2: 28 mEq/L (ref 22–29)
CREATININE: 0.7 mg/dL (ref 0.6–1.1)
EGFR: 89 mL/min/{1.73_m2} — ABNORMAL LOW (ref >90)
Glucose: 137 mg/dl (ref 70–140)
Potassium: 2.9 mEq/L — CL (ref 3.5–5.1)
Sodium: 139 mEq/L (ref 136–145)
TOTAL PROTEIN: 6.6 g/dL (ref 6.4–8.3)

## 2016-08-03 MED ORDER — SODIUM CHLORIDE 0.9% FLUSH
10.0000 mL | INTRAVENOUS | Status: DC | PRN
  Administered 2016-08-03: 10 mL
  Filled 2016-08-03: qty 10

## 2016-08-03 MED ORDER — POTASSIUM CHLORIDE 10 MEQ/100ML IV SOLN: 10.0000 meq | INTRAVENOUS | Status: DC

## 2016-08-03 MED ORDER — ACETAMINOPHEN 325 MG PO TABS
650.0000 mg | ORAL_TABLET | Freq: Once | ORAL | Status: AC
  Administered 2016-08-03: 650 mg via ORAL

## 2016-08-03 MED ORDER — POTASSIUM CHLORIDE CRYS ER 20 MEQ PO TBCR: 20.0000 meq | EXTENDED_RELEASE_TABLET | Freq: Two times a day (BID) | ORAL | 1 refills | Status: DC

## 2016-08-03 MED ORDER — ACETAMINOPHEN 325 MG PO TABS
ORAL_TABLET | ORAL | Status: AC
  Filled 2016-08-03: qty 2

## 2016-08-03 MED ORDER — POTASSIUM CHLORIDE 10 MEQ/100ML IV SOLN
10.0000 meq | INTRAVENOUS | Status: AC
  Administered 2016-08-03 (×2): 10 meq via INTRAVENOUS
  Filled 2016-08-03: qty 100

## 2016-08-03 MED ORDER — HEPARIN SOD (PORK) LOCK FLUSH 100 UNIT/ML IV SOLN
500.0000 [IU] | Freq: Once | INTRAVENOUS | Status: AC | PRN
  Administered 2016-08-03: 500 [IU]
  Filled 2016-08-03: qty 5

## 2016-08-03 MED ORDER — SODIUM CHLORIDE 0.9 % IV SOLN
6.0000 mg/kg | Freq: Once | INTRAVENOUS | Status: AC
  Administered 2016-08-03: 588 mg via INTRAVENOUS
  Filled 2016-08-03: qty 28

## 2016-08-03 MED ORDER — SODIUM CHLORIDE 0.9 % IV SOLN
Freq: Once | INTRAVENOUS | Status: AC
  Administered 2016-08-03: 10:00:00 via INTRAVENOUS

## 2016-08-03 NOTE — Telephone Encounter (Signed)
Scheduled appt per 7/6 los - no treatment plan in yet. Gave patient AVS and calender per LOS. Per Lannette Donath to schedule appts with [facility] .

## 2016-08-03 NOTE — Telephone Encounter (Signed)
Left a message for patient to return my call. 

## 2016-08-03 NOTE — Patient Instructions (Addendum)
[facility] Discharge Instructions for Patients Receiving Chemotherapy  Today you received the following chemotherapy agents Herceptin  To help prevent nausea and vomiting after your treatment, we encourage you to take your nausea medication as directed. No Zofran for 3 days. Take Compazine instead.    If you develop nausea and vomiting that is not controlled by your nausea medication, call the clinic.   BELOW ARE SYMPTOMS THAT SHOULD BE REPORTED IMMEDIATELY:  *FEVER GREATER THAN 100.5 F  *CHILLS WITH OR WITHOUT FEVER  NAUSEA AND VOMITING THAT IS NOT CONTROLLED WITH YOUR NAUSEA MEDICATION  *UNUSUAL SHORTNESS OF BREATH  *UNUSUAL BRUISING OR BLEEDING  TENDERNESS IN MOUTH AND THROAT WITH OR WITHOUT PRESENCE OF ULCERS  *URINARY PROBLEMS  *BOWEL PROBLEMS  UNUSUAL RASH Items with * indicate a potential emergency and should be followed up as soon as possible.  Feel free to call the clinic you have any questions or concerns. The clinic phone number is [phone].  Please show the [facility] at check-in to the Emergency Department and triage nurse.   Hypokalemia (LOW POTASSIUM) Hypokalemia means that the amount of potassium in the blood is lower than normal.Potassium is a chemical that helps regulate the amount of fluid in the body (electrolyte). It also stimulates muscle tightening (contraction) and helps nerves work properly.Normally, most of the body's potassium is inside of cells, and only a very small amount is in the blood. Because the amount in the blood is so small, minor changes to potassium levels in the blood can be life-threatening. What are the causes? This condition may be caused by:  Antibiotic medicine.  Diarrhea or vomiting. Taking too much of a medicine that helps you have a bowel movement (laxative) can cause diarrhea and lead to hypokalemia.  Chronic kidney disease (CKD).  Medicines that help the body get rid of excess fluid  (diuretics).  Eating disorders, such as bulimia.  Low magnesium levels in the body.  Sweating a lot.  What are the signs or symptoms? Symptoms of this condition include:  Weakness.  Constipation.  Fatigue.  Muscle cramps.  Mental confusion.  Skipped heartbeats or irregular heartbeat (palpitations).  Tingling or numbness.  How is this diagnosed? This condition is diagnosed with a blood test. How is this treated? Hypokalemia can be treated by taking potassium supplements by mouth or adjusting the medicines that you take. Treatment may also include eating more foods that contain a lot of potassium. If your potassium level is very low, you may need to get potassium through an IV tube in one of your veins and be monitored in the hospital. Follow these instructions at home:  Take over-the-counter and prescription medicines only as told by your health care provider. This includes vitamins and supplements.  Eat a healthy diet. A healthy diet includes fresh fruits and vegetables, whole grains, healthy fats, and lean proteins.  If instructed, eat more foods that contain a lot of potassium, such as: ? Nuts, such as peanuts and pistachios. ? Seeds, such as sunflower seeds and pumpkin seeds. ? Peas, lentils, and lima beans. ? Whole grain and bran cereals and breads. ? Fresh fruits and vegetables, such as apricots, avocado, bananas, cantaloupe, kiwi, oranges, tomatoes, asparagus, and potatoes. ? Orange juice. ? Tomato juice. ? Red meats. ? Yogurt.  Keep all follow-up visits as told by your health care provider. This is important. Contact a health care provider if:  You have weakness that gets worse.  You feel your heart pounding  or racing.  You vomit.  You have diarrhea.  You have diabetes (diabetes mellitus) and you have trouble keeping your blood sugar (glucose) in your target range. Get help right away if:  You have chest pain.  You have shortness of breath.  You  have vomiting or diarrhea that lasts for more than 2 days.  You faint. This information is not intended to replace advice given to you by your health care provider. Make sure you discuss any questions you have with your health care provider. Document Released: 01/15/2005 Document Revised: 09/03/2015 Document Reviewed: 09/03/2015 Elsevier Interactive Patient Education  2018 [employer].

## 2016-08-03 NOTE — Telephone Encounter (Signed)
I asked patient if she has finished the prednisone taper that we asked her to start on 06/08/16. Patient states she took her last pill this morning and needs a refill. Informed patient that she should have been finished with the taper in June. Patient states her PCP told her to take a extended taper for her lungs. I asked patient if she is having any problems with her UC. Patient states her colitis is fine. Informed patient she will have to call her PCP for the refill of prednisone since that they asked her to take an extended taper. Patient verbalized understanding.

## 2016-08-04 ENCOUNTER — Encounter: Payer: Self-pay | Admitting: Hematology

## 2016-08-06 ENCOUNTER — Encounter: Payer: Self-pay | Admitting: Gastroenterology

## 2016-08-06 ENCOUNTER — Ambulatory Visit: Payer: 59

## 2016-08-06 ENCOUNTER — Ambulatory Visit (INDEPENDENT_AMBULATORY_CARE_PROVIDER_SITE_OTHER): Payer: 59 | Admitting: Gastroenterology

## 2016-08-06 VITALS — BP 110/60 | HR 104 | Ht 64.5 in | Wt 205.0 lb

## 2016-08-06 DIAGNOSIS — R933 Abnormal findings on diagnostic imaging of other parts of digestive tract: Secondary | ICD-10-CM | POA: Diagnosis not present

## 2016-08-06 DIAGNOSIS — R932 Abnormal findings on diagnostic imaging of liver and biliary tract: Secondary | ICD-10-CM

## 2016-08-06 DIAGNOSIS — K5100 Ulcerative (chronic) pancolitis without complications: Secondary | ICD-10-CM

## 2016-08-06 NOTE — Progress Notes (Signed)
    History of Present Illness: This is a 56 year old female returning for follow-up of ulcerative colitis. She was hospitalized for 2 days in May for a colitis flare. She completed a prednisone taper. She is currently maintained on mesalamine 4.8 g daily. She states she has 2 soft stools per day. She has not had any rectal bleeding for about 2 months. No diarrhea for 1 month. She continues to receive chemotherapy every 3 weeks for breast cancer. Chemotherapy  frequently leads to temporary looser stools that resolve.   Abd/pelvic CT 06/07/2016 IMPRESSION: 1. Wall thickening of the descending and sigmoid colon consistent with an infectious or inflammatory colitis. 2. There is also a short segment of narrowing and possible wall thickening of the superior ascending colon which could reflect a constricting mass or, more likely, be an area of persistent colonic spasm. This could be further assessed with either colonoscopy or a barium enema after the current symptoms of colitis have resolved. 3. Small, subcentimeter, low-density liver lesions which may all be benign. However, 3 these are not evident on prior CT. Liver metastatic disease possible. These could be further assessed with liver MRI with and without contrast. 4. Adrenal lesions which are stable, on the right and myelolipoma and on the left most likely an adenoma.   Current Medications, Allergies, Past Medical History, Past Surgical History, Family History and Social History were reviewed in [employer] record.  Physical Exam: General: Well developed, well nourished, no acute distress Head: Normocephalic and atraumatic Eyes:  sclerae anicteric, EOMI Ears: Normal auditory acuity Mouth: No deformity or lesions Lungs: Clear throughout to auscultation Heart: Regular rate and rhythm; no murmurs, rubs or bruits Abdomen: Soft, non tender and non distended. No masses, hepatosplenomegaly or hernias noted. Normal Bowel  sounds Musculoskeletal: Symmetrical with no gross deformities  Pulses:  Normal pulses noted Extremities: No clubbing, cyanosis, edema or deformities noted Neurological: Alert oriented x 4, grossly nonfocal Psychological:  Alert and cooperative. Normal mood and affect  Assessment and Recommendations:  1. Ulcerative colitis, rectum to hepatic flexure. Continue Asacol 1.6 g 3 times a day. Call if symptoms relapse. REV in 6 months.   2. Abnormal CT scan of the liver and colon. Given absence of any mass lesions on colonoscopy performed in July 2017 no further evaluation of the colon is necessary at this time. Suspected ascending colon spasm and left colon wall thickening with colitis flare. Oncology has ordered an MR abdomen with/without contrast for further evaluation of the small hepatic lesions.  3. Breast cancer.

## 2016-08-06 NOTE — Patient Instructions (Signed)
Continue current medications.   Thank you for choosing me and [facility].  Malcolm T. Stark, Jr., MD., FACG  

## 2016-08-07 ENCOUNTER — Ambulatory Visit (HOSPITAL_COMMUNITY): Payer: 59

## 2016-08-08 NOTE — Progress Notes (Signed)
Brandi Dickson  [phone] [phone]  Clinic Follow Up Note   Patient Care Team: Orpah Melter, MD as PCP - General (Family Medicine) [name], MD as Consulting Physician (General Surgery) Truitt Merle, MD as Consulting Physician (Hematology) Gery Pray, MD as Consulting Physician (Radiation Oncology) Ladene Artist, MD as Consulting Physician (Gastroenterology) 08/10/2016  CHIEF COMPLAINTS:  Follow up left breast cancer  Oncology History   Cancer Staging Breast cancer of upper-outer quadrant of left female breast [facility]) Staging form: Breast, AJCC 8th Edition - Clinical stage from 04/25/2016: Stage IIA (cT2, cN0, cM0, G3, ER: Negative, PR: Negative, HER2: Positive) - Signed by Truitt Merle, MD on 05/02/2016       Breast cancer of upper-outer quadrant of left female breast ([place])   04/24/2016 Mammogram    Category B breasts with a new irregular mass in the UOQ left breast middle depth. Ultrasound revealed a 3.9 cm mass in the 1:00 position. The left axilla was negative.       04/25/2016 Initial Biopsy    Biopsy of the left breast showed grade 3 invasive ductal carcinoma, DCIS, and lymphovascular invasion was present.      04/25/2016 Receptors her2    ER 0% negative, PR 0% negative, HER2 positive, Ki67 30%      05/02/2016 Initial Diagnosis    Breast cancer of upper-outer quadrant of left female breast ([place])     05/07/2016 Imaging    MRI of the bilateral breast 05/07/16 IMPRESSION: Lobulated enhancing mass (4.0 x 2.7 x 3.1 cm) in the upper-outer quadrant of the left breast corresponding with the recently diagnosed invasive mammary carcinoma. Linear enhancement extends 2.6 cm posterior to the mass worrisome for ductal carcinoma in-situ.      05/09/2016 Echocardiogram    Echo 05/09/16 -LF EF: 55-60%      05/11/2016 -  Neo-Adjuvant Chemotherapy    Docetaxel, Carboplatin, Herceptin and pejeta (TCHP) every 3 weeks, for total of 6 cycles,  followed by Herceptin and perjeta maintenance therapy to complete 1 year treatment   starting 05/11/16; Docetaxel changed to Taxol, carbo dose reduced to AUC 4.5 (from 5)  from cycle 2 due to severe diarrhea and cytopenia, on 06/01/16. Will hold Perjeta and add herceptin for cycle 3  Due to poor toleration we may proceed with weekly taxol starting 08/10/16 Start Herceptin every 3 weeks for the next year starting 08/03/16       05/13/2016 Imaging    CT Angio Chest PE IMPRESSION: No evidence of pulmonary emboli. Left breast mass consistent with the given clinical history. Stable left adrenal lesion likely representing a small adenoma.      05/18/2016 - 05/24/2016 Hospital Admission    Patient presented with nausea, vomiting, and diarrhea; admitted to hospital with Hyponatremia      06/07/2016 - 06/09/2016 Hospital Admission    Patient presents to hospital complaints of rectal bleeding and abdominal cramps when defacating      06/07/2016 Imaging    CT ABDOMEN PELVIS W CONTRAST  IMPRESSION: 1. Wall thickening of the descending and sigmoid colon consistent with an infectious or inflammatory colitis. 2. There is also a short segment of narrowing and possible wall thickening of the superior ascending colon which could reflect a constricting mass or, more likely, be an area of persistent colonic spasm. This could be further assessed with either colonoscopy or a barium enema after the current symptoms of colitis have resolved. 3. Small, subcentimeter, low-density liver lesions which may all be benign.  However, 3 these are not evident on prior CT. Liver metastatic disease possible. These could be further assessed with liver MRI with and without contrast. 4. Adrenal lesions which are stable, on the right and myelolipoma and on the left most likely an adenoma.      06/07/2016 Imaging    CT A/P IMPRESSION: 1. Wall thickening of the descending and sigmoid colon consistent with an infectious or  inflammatory colitis. 2. There is also a short segment of narrowing and possible wall thickening of the superior ascending colon which could reflect a constricting mass or, more likely, be an area of persistent colonic spasm. This could be further assessed with either colonoscopy or a barium enema after the current symptoms of colitis have resolved. 3. Small, subcentimeter, low-density liver lesions which may all be benign. However, 3 these are not evident on prior CT. Liver metastatic disease possible. These could be further assessed with liver MRI with and without contrast. 4. Adrenal lesions which are stable, on the right and myelolipoma and on the left most likely an adenoma.      06/07/2016 - 06/09/2016 Hospital Admission    Diarrhea and rectal Bleeding      07/19/2016 - 07/20/2016 Hospital Admission    Diarrhea and dehydration       HISTORY OF PRESENTING ILLNESS (05/02/16):  Brandi Dickson 56 y.o. female is here because of a new diagnosis of left breast cancer. She is accompanied by her husband to our multidisciplinary breast clinic today.  The patient presented with a palpable left breast lump approximately 2 weeks ago. Bilateral diagnostic mammogram on 04/24/16 showed Category B breasts with a new irregular mass in the UOQ left breast middle depth. Ultrasound performed on 04/24/16 revealed a 3.9 cm mass in the 1:00 position. The left axilla was negative.  Biopsy of the left breast on 04/25/16 showed grade 3 invasive ductal carcinoma, DCIS, and lymphovascular invasion was present (ER 0% negative, PR 0% negative, HER2 positive, Ki67 30%).  She denies tenderness of the biopsied area. Reports minor dimpling. The patient is 8 weeks out from a left hip replacement. She had a right hip replacement 2010 years ago.  The patient and her husband present today in multidisciplinary breast clinic to discuss treatment options for the management of her disease.  The patient is taking Neurontin  for neuropathy in her feet from diabetes. She has been diagnosed for diabetes for the past 4 years. She states she only has neuropathy at night and take 2 Neurontin at night and then has no symptoms.  GYN HISTORY  Menarchal: 11 LMP: Complete hysterectomy for uterine fibroids ~ 2009. She was bleeding too much with her last menstrual cycle lasting 6 weeks. No issues with menopause. Contraceptive: no  HRT: No GP: G1P0  CURRENT THERAPY: Docetaxel, Carboplatin, Herceptin and pejeta (TCHP) every 3 weeks, for total of 6 cycles, followed by Herceptin and perjeta maintenance therapy to complete 1 year treatment starting 05/11/16; Docetaxel changed to Taxol, carbo dose reduced to AUC 4.5 (from 5)  from cycle 2 due to severe diarrhea and cytopenia, on 06/01/16. Will hold Perjeta from cycle 3. Stop TCHP due to poor toleration and Start Herceptin for every 3 weeks for the next year on 08/03/16; start weekly Taxol 08/10/16  INTERVAL HISTORY:  KELCIE CURRIE returns for follow up. She presents to the clinic today with her husband. Her taste buds are back and she can eat what she likes again. She says her stool is back solid.  The neuropathy in her hands has not changed since last time.  She said her insurance hindered her from getting MRI this morning. She is now scheduled for 08/15/16. Her ECHO is next week as well.  She is taking taking prednisone for her colitis. She finished the prescription they gave her and she was refilled by Dr. Olen Pel.    MEDICAL HISTORY:  Past Medical History:  Diagnosis Date  . Arthritis   . Asthma    triggered with [name] perfumes and cigarette smoke  . Cancer ([place])   . Colitis   . Diabetes mellitus without complication ([place])   . Hypertension   . Neuropathy     SURGICAL HISTORY: Past Surgical History:  Procedure Laterality Date  . ABDOMINAL HYSTERECTOMY    . DILATION AND CURETTAGE OF UTERUS    . PORTACATH PLACEMENT Right 05/08/2016   Procedure: INSERTION  PORT-A-CATH WITH [place];  Surgeon: [name], MD;  Location: [place];  Service: General;  Laterality: Right;  . TONSILLECTOMY    . TOTAL HIP ARTHROPLASTY Right   . TOTAL HIP ARTHROPLASTY Left 03/06/2016   Procedure: LEFT TOTAL HIP ARTHROPLASTY ANTERIOR APPROACH;  Surgeon: [name], MD;  Location: [facility];  Service: Orthopedics;  Laterality: Left;    SOCIAL HISTORY: Social History   Social History  . Marital status: Married    Spouse name: N/A  . Number of children: N/A  . Years of education: N/A   Occupational History  . Not on file.   Social History Main Topics  . Smoking status: Former Smoker    Packs/day: 1.00    Years: 29.00    Types: Cigarettes    Quit date: 10/27/2002  . Smokeless tobacco: Never Used  . Alcohol use No  . Drug use: No  . Sexual activity: Yes    Birth control/ protection: Surgical   Other Topics Concern  . Not on file   Social History Narrative  . No narrative on file    FAMILY HISTORY: Family History  Problem Relation Age of Onset  . Colon cancer Father   . Stomach cancer Paternal Uncle   . Stomach cancer Paternal Uncle   . Melanoma Brother   . Thyroid cancer Brother   . Breast cancer Maternal Aunt   . Breast cancer Maternal Aunt   . Breast cancer Cousin     ALLERGIES:  is allergic to lisinopril; augmentin [amoxicillin-pot clavulanate]; and benadryl [diphenhydramine].  MEDICATIONS:  Current Outpatient Prescriptions  Medication Sig Dispense Refill  . albuterol (PROVENTIL HFA;VENTOLIN HFA) 108 (90 Base) MCG/ACT inhaler Inhale 1-2 puffs into the lungs every 6 (six) hours as needed for wheezing or shortness of breath.    . dexamethasone (DECADRON) 4 MG tablet Take 1 tablet (4 mg total) by mouth daily. Start the day before Taxotere. Then again the day after chemo for 3 days. 30 tablet 1  . dicyclomine (BENTYL) 20 MG tablet Take 1 tablet (20 mg total) by mouth 4 (four) times daily -  before meals and at bedtime. 30 tablet 0    . diphenoxylate-atropine (LOMOTIL) 2.5-0.025 MG tablet Take one to two tablets every six hours as needed for diarrhea 120 tablet 1  . ferrous sulfate (FERROUSUL) 325 (65 FE) MG tablet Take 1 tablet (325 mg total) by mouth 3 (three) times daily with meals. (Patient taking differently: Take 325 mg by mouth 2 (two) times daily with a meal. )    . gabapentin (NEURONTIN) 100 MG capsule Take 200 mg by  mouth at bedtime.     . hydrocortisone (ANUSOL-HC) 25 MG suppository Place 1 suppository (25 mg total) rectally 2 (two) times daily. 12 suppository 0  . lidocaine-prilocaine (EMLA) cream Apply 1 application to skin 1.5 to 2 hrs before use.  Cover to secure cream with plastic wrap. 30 g PRN  . loperamide (IMODIUM) 1 MG/5ML solution Take 3 mg by mouth as needed for diarrhea or loose stools.    [name] Kitchen LORazepam (ATIVAN) 0.5 MG tablet Take 1 tablet (0.5 mg total) by mouth once as needed for anxiety. 2 tablet 0  . magnesium oxide (MAG-OX) 400 MG tablet Take 400 mg by mouth 2 (two) times daily.    . Mesalamine (ASACOL HD) 800 MG TBEC Take 2 tablets (1,600 mg total) by mouth 3 (three) times daily. 90 tablet 5  . metFORMIN (GLUCOPHAGE-XR) 500 MG 24 hr tablet Take 1,000 mg by mouth at bedtime.    . metoprolol (LOPRESSOR) 50 MG tablet Take 50 mg by mouth at bedtime.     . niacin 250 MG tablet Take 250 mg by mouth at bedtime.     . ondansetron (ZOFRAN) 8 MG tablet Take 1 tablet (8 mg total) by mouth 2 (two) times daily as needed for refractory nausea / vomiting. Start on day 3 after chemo. 30 tablet 1  . potassium chloride SA (K-DUR,KLOR-CON) 20 MEQ tablet Take 1 tablet (20 mEq total) by mouth 2 (two) times daily. 60 tablet 1  . predniSONE (DELTASONE) 20 MG tablet Take 30 mg by mouth daily.     . prochlorperazine (COMPAZINE) 10 MG tablet Take 1 tablet (10 mg total) by mouth every 6 (six) hours as needed (Nausea or vomiting). 30 tablet 1  . Specialty Vitamins Products (VITAMINS FOR THE HAIR) TABS Take 2 tablets by mouth 2  (two) times daily.    . traMADol (ULTRAM) 50 MG tablet Take 1-2 tablets (50-100 mg total) by mouth every 6 (six) hours as needed. 40 tablet 0  . triamterene-hydrochlorothiazide (MAXZIDE) 75-50 MG per tablet Take 0.5 tablets by mouth daily.      No current facility-administered medications for this visit.    Facility-Administered Medications Ordered in Other Visits  Medication Dose Route Frequency Provider Last Rate Last Dose  . heparin lock flush 100 unit/mL  500 Units Intracatheter Once PRN Truitt Merle, MD      . sodium chloride flush (NS) 0.9 % injection 10 mL  10 mL Intracatheter PRN Truitt Merle, MD        REVIEW OF SYSTEMS:   Constitutional: Denies fevers, chills or abnormal night sweats (+) taste has returned  Eyes: Denies blurriness of vision, double vision or watery eyes Ears, nose, mouth, throat, and face: Denies mucositis or sore throat Respiratory: Denies cough, dyspnea or wheezes Cardiovascular: Denies palpitation, chest discomfort or lower extremity swelling Gastrointestinal:  Denies heartburn (+) diarrhea improved (+) nausea  Skin: Denies abnormal skin rashes Lymphatics: Denies new lymphadenopathy or easy bruising Neurological: (+) neuropathy in hands Behavioral/Psych: Mood is stable, no new changes  All other systems were reviewed with the patient and are negative.  PHYSICAL EXAMINATION:  ECOG PERFORMANCE STATUS: 1 - Symptomatic but completely ambulatory  Vitals:   08/10/16 1320  BP: 126/86  Pulse: 82  Resp: 18  Temp: 98.9 F (37.2 C)   Filed Weights   08/10/16 1320  Weight: 202 lb 4.8 oz (91.8 kg)    GENERAL:alert, no distress and comfortable SKIN: skin color, texture, turgor are normal, no rashes or significant lesions  EYES: normal, conjunctiva are pink and non-injected, sclera clear OROPHARYNX:no exudate, no erythema and lips, buccal mucosa, and tongue normal  NECK: supple, thyroid normal size, non-tender, without nodularity LYMPH:  no palpable  lymphadenopathy in the cervical, axillary or inguinal LUNGS: clear to auscultation and percussion with normal breathing effort HEART: regular rate & rhythm and no murmurs and no lower extremity edema ABDOMEN:abdomen soft, non-tender and normal bowel sounds Musculoskeletal:no cyanosis of digits and no clubbing  PSYCH: alert & oriented x 3 with fluent speech NEURO: no focal motor/sensory deficits BREAST: Breast inspection showed them to be symmetrical with no nipple discharge. Left breast exam showed UOQ 2 x 2.5 cm reduced to 2 x 1.5 cm palpable mass, non tender, and smaller in size than previous exam. No palpable mass in the right breast and bilateral axilla.  LABORATORY DATA:  I have reviewed the data as listed CBC Latest Ref Rng & Units 08/10/2016 08/03/2016 07/20/2016  WBC 3.9 - 10.3 10e3/uL 7.0 8.0 2.4(L)  Hemoglobin 11.6 - 15.9 g/dL 11.7 10.7(L) 8.4(L)  Hematocrit 34.8 - 46.6 % 35.5 33.6(L) 25.1(L)  Platelets 145 - 400 10e3/uL 235 107(L) 94(L)   CMP Latest Ref Rng & Units 08/10/2016 08/03/2016 07/20/2016  Glucose 70 - 140 mg/dl 168(H) 137 125(H)  BUN 7.0 - 26.0 mg/dL 24.6 18.5 12  Creatinine 0.6 - 1.1 mg/dL 1.0 0.7 0.62  Sodium 136 - 145 mEq/L 142 139 142  Potassium 3.5 - 5.1 mEq/L 3.8 2.9(LL) 3.5  Chloride 101 - 111 mmol/L - - 110  CO2 22 - 29 mEq/L _0 Calcium 8.4 - 10.4 mg/dL 9.6 9.2 7.8(L)  Total Protein 6.4 - 8.3 g/dL 7.0 6.6 -  Total Bilirubin 0.20 - 1.20 mg/dL 0.39 0.44 -  Alkaline Phos 40 - 150 U/L 89 83 -  AST 5 - 34 U/L 13 14 -  ALT 0 - 55 U/L 17 20 -   PATHOLOGY REPORT:   ADDITIONAL INFORMATION: 04/25/16 PROGNOSTIC INDICATORS Results: IMMUNOHISTOCHEMICAL AND MORPHOMETRIC ANALYSIS PERFORMED MANUALLY Estrogen Receptor: 0%, NEGATIVE Progesterone Receptor: 0%, NEGATIVE Proliferation Marker Ki67: 30% COMMENT: The negative hormone receptor study(ies) in this case has no internal positive control. REFERENCE RANGE ESTROGEN RECEPTOR NEGATIVE 0% POSITIVE =>1% REFERENCE  RANGE PROGESTERONE RECEPTOR NEGATIVE 0% POSITIVE =>1% All controls stained appropriately Enid Cutter MD Pathologist, Electronic Signature ( Signed 05/01/2016) FLUORESCENCE IN-SITU HYBRIDIZATION Results: HER2 - **POSITIVE** RATIO OF HER2/CEP17 SIGNALS 2.25 AVERAGE HER2 COPY NUMBER PER CELL 8.45 1 of 3 FINAL for Hertenstein, Tinita P ([id number]) ADDITIONAL INFORMATION:(continued) Reference Range: NEGATIVE HER2/CEP17 Ratio <2.0 and average HER2 copy number <4.0 EQUIVOCAL HER2/CEP17 Ratio <2.0 and average HER2 copy number 4.0 and <6.0 POSITIVE HER2/CEP17 Ratio >=2.0 or <2.0 and average HER2 copy number >=6.0 Enid Cutter MD Pathologist, Electronic Signature ( Signed 04/30/2016) FINAL DIAGNOSIS Diagnosis Breast, left, needle core biopsy - INVASIVE DUCTAL CARCINOMA, SEE COMMENT. - DUCTAL CARCINOMA IN SITU. - LYMPHOVASCULAR INVASION PRESENT. Microscopic Comment The carcinoma appears grade 3 with focal squamous differentiation. Prognostic markers will be ordered. Dr. Lyndon Code has reviewed the case. The case was called to Dr. Isaiah Blakes on 04/26/2016. Vicente Males MD Pathologist, Electronic Signature (Case signed 04/26/2016)  RADIOGRAPHIC STUDIES: I have personally reviewed the radiological images as listed and agreed with the findings in the report.  Mammogram 08/09/16 IMPRESSION:  The 3.1 cm x 2 cm x 1.5 cm irregular equal density mass in the left breast is consistent with the known carcinoma showing mammographic evidence of preoperative chemotherapy response.    [place] of  left breast 08/09/16 IMPRESSION:  The 2.7 cm lobulated mass in the left breast is a known biopsy positive for malignancy. Surgical and oncology consult in progress.    See onc history CT ABD/Pelvi 5/10/18s W CONTRAST 06/07/16 IMPRESSION: 1. Wall thickening of the descending and sigmoid colon consistent with an infectious or inflammatory colitis. 2. There is also a short segment of narrowing and possible wall thickening  of the superior ascending colon which could reflect a constricting mass or, more likely, be an area of persistent colonic spasm. This could be further assessed with either colonoscopy or a barium enema after the current symptoms of colitis have resolved. 3. Small, subcentimeter, low-density liver lesions which may all be benign. However, 3 these are not evident on prior CT. Liver metastatic disease possible. These could be further assessed with liver MRI with and without contrast. 4. Adrenal lesions which are stable, on the right and myelolipoma and on the left most likely an adenoma.  CT Angio Chest PE W and/or wo Contrast 05/13/16 IMPRESSION: No evidence of pulmonary emboli.  Echo 05/09/16 Study Conclusions -LF EF: 55-60% - Left ventricle: The cavity size was normal. There was mild focal   basal hypertrophy of the septum. Indeterminant diastolic   function. Systolic function was normal. The estimated ejection   fraction was in the range of 55% to 60%. Wall motion was normal;   there were no regional wall motion abnormalities. GLS abnormal at   -13.3%, poor images however.  ASSESSMENT & PLAN: 56 y.o. post-menopausal Caucasian female with a self palpated left breast mass.  1. Breast cancer of upper-outer quadrant of left breast, invasive ductal carcinoma,  stage IIA (cT2N0M0) grade 3, ER-, PR-, HER2 amplified -We previously reviewed the patient's imaging and pathology. -We previously reviewed her staging and biology of her breast cancer  -We previously discussed that surgical resection is the definitive treatment for breast cancer, she was seen by breast surgeon Dr. Lucia Gaskins today, lumpectomy versus mastectomy were discussed with patient. -We previously discussed HER2 positive breast cancers total approximately 15% of breast cancers and happens to be more aggressive than HER2 negative cancers, especially ER and PR negative disease, she has high likelihood of cancer recurrence after  complete surgical resection.  -I recommend neoadjuvant chemotherapy TCHP (docetaxel, carboplatin, Herceptin and pejeta) every 3 weeks for 6 cycles, followed by maintenance Herceptin and pejeta to complete 1 year therapy -Her baseline echo was normal, she was seen by cardiologist Dr. Benjamine Mola -She unfortunately developed severe diarrhea and cytopenia after first cycle chemotherapy, was hospitalized for a week. She has now recovered well. -cycle 2 chemo docetaxel was changed to paclitaxel, carbo and pejeta dose was reduced, unfortunately she again developed worsening diarrhea and bleeding, required a few days hospitalization. -Perjeta has been held from cycle 3, she tolerated cycle 3 with carboplatin, paclitaxel and Herceptin well but developed dyspnea and was seen by PCP  -She again was admitted to hospital for dehydration and diarrhea after cycle 4 chemotherapy -Due to her frequent hospitalization from chemotherapy-related complications, and she has received 4 cycles of chemo (planed for 6 cycles), I stopped cycle 5 carbo and taxol  We reviewed her [place] and mammogram form 08/09/16 that shows partial response to chemotherapy, the mass had reduced from 4 cm to 2.7, cm no other new lesions.  -I suggest we move forward with low dose weekly taxol for the next 4-6 weeks with no neulasta. Granix, will be given if her counts become low. We discussed the side effects  of Taxol, especially neuropathy and we will stop if her neuropathy get worse.  -We will continue Herceptin maintenance therapy  -Plan to repeat MRI when she completes chemotherapy -Labs reviewed and her counts have recovered her Potassium  is 3.8 today. She will go back down to one potassium pill a day.  -Her Next Echo and abdominal MRI is next week   2. Genetics -Given her strong family history of breast cancer, we recommend her to see genetic counseling to ruled out inheritable breast cancer syndrome. She agreed. -Genetic counseling  scheduled  3. Type 2 Diabetes mellitus, HTN -Managed by her PCP. -The patient has peripheral neuropathy in her feet from her diabetes. The patient is already on Neurontin with 200 mg at night. We discussed that chemotherapy may make her neuropathy worse. -Steroids will be given to reduce chemo side effects and I will reduce dexa to 64m daily to not affect her blood sugar much. -We'll monitor her blood glucose and blood pressure closely during her chemotherapy treatment.  4. Ulcerative colitis -We previously discussed that chemotherapy would cause diarrhea and the patient's colitis may exacerbate during chemo  -She is not taking steroids for this. She is on mesalamine, will follow up with Dr. SFuller Plan -She had severe diarrhea after first cycle chemotherapy, was treated for ulcerative colitis flare, she will follow-up with Dr. SFuller Plannext week. -I previously advised the patient to keep herself adequatly hydrated and to take Imodium PRN. - I again previously advised the patient to take Lomotil and Imodium frequently as needed. -Since change in chemo her diarrhea has resolved.  -She is still on Prednisone I suggest she should be tapered off her steroids. I will send note to Dr. SFuller Plan   5. Arthritis  -s/p b/l hip replacement, last surgery in February 2018.   PLAN -Start weekly Taxol today -continue herceptin treatment every 3 weeks.  -Contact Dr. SFuller Planabout prednisone  -ECHO and ABD MRI next week  -Lab and f/u in 2 weeks.     No orders of the defined types were placed in this encounter.   All questions were answered. The patient knows to call the clinic with any problems, questions or concerns.  I spent 25 minutes counseling the patient face to face. The total time spent in the appointment was 30 minutes and more than 50% was on counseling.  This document serves as a record of services personally performed by YTruitt Merle MD. It was created on her behalf by AJoslyn Devon a trained medical  scribe. The creation of this record is based on the scribe's personal observations and the provider's statements to them. This document has been checked and approved by the attending provider.    FTruitt Merle MD 08/10/2016

## 2016-08-09 DIAGNOSIS — N6321 Unspecified lump in the left breast, upper outer quadrant: Secondary | ICD-10-CM | POA: Diagnosis not present

## 2016-08-10 ENCOUNTER — Ambulatory Visit (HOSPITAL_COMMUNITY): Admission: RE | Admit: 2016-08-10 | Payer: 59 | Source: Ambulatory Visit

## 2016-08-10 ENCOUNTER — Ambulatory Visit (HOSPITAL_BASED_OUTPATIENT_CLINIC_OR_DEPARTMENT_OTHER): Payer: 59 | Admitting: Hematology

## 2016-08-10 ENCOUNTER — Encounter: Payer: Self-pay | Admitting: *Deleted

## 2016-08-10 ENCOUNTER — Other Ambulatory Visit (HOSPITAL_BASED_OUTPATIENT_CLINIC_OR_DEPARTMENT_OTHER): Payer: 59

## 2016-08-10 ENCOUNTER — Ambulatory Visit: Payer: 59

## 2016-08-10 ENCOUNTER — Ambulatory Visit (HOSPITAL_BASED_OUTPATIENT_CLINIC_OR_DEPARTMENT_OTHER): Payer: 59

## 2016-08-10 VITALS — BP 126/86 | HR 82 | Temp 98.9°F | Resp 18 | Ht 64.5 in | Wt 202.3 lb

## 2016-08-10 DIAGNOSIS — I10 Essential (primary) hypertension: Secondary | ICD-10-CM | POA: Diagnosis not present

## 2016-08-10 DIAGNOSIS — E119 Type 2 diabetes mellitus without complications: Secondary | ICD-10-CM | POA: Diagnosis not present

## 2016-08-10 DIAGNOSIS — K5190 Ulcerative colitis, unspecified, without complications: Secondary | ICD-10-CM

## 2016-08-10 DIAGNOSIS — Z171 Estrogen receptor negative status [ER-]: Principal | ICD-10-CM

## 2016-08-10 DIAGNOSIS — K51311 Ulcerative (chronic) rectosigmoiditis with rectal bleeding: Secondary | ICD-10-CM

## 2016-08-10 DIAGNOSIS — C50412 Malignant neoplasm of upper-outer quadrant of left female breast: Secondary | ICD-10-CM | POA: Diagnosis not present

## 2016-08-10 DIAGNOSIS — Z5111 Encounter for antineoplastic chemotherapy: Secondary | ICD-10-CM | POA: Diagnosis not present

## 2016-08-10 DIAGNOSIS — Z95828 Presence of other vascular implants and grafts: Secondary | ICD-10-CM

## 2016-08-10 LAB — CBC WITH DIFFERENTIAL/PLATELET
BASO%: 0.2 % (ref 0.0–2.0)
Basophils Absolute: 0 10*3/uL (ref 0.0–0.1)
EOS ABS: 0 10*3/uL (ref 0.0–0.5)
EOS%: 0.1 % (ref 0.0–7.0)
HCT: 35.5 % (ref 34.8–46.6)
HGB: 11.7 g/dL (ref 11.6–15.9)
LYMPH%: 15.4 % (ref 14.0–49.7)
MCH: 32.4 pg (ref 25.1–34.0)
MCHC: 33.1 g/dL (ref 31.5–36.0)
MCV: 97.9 fL (ref 79.5–101.0)
MONO#: 0.3 10*3/uL (ref 0.1–0.9)
MONO%: 4.7 % (ref 0.0–14.0)
NEUT%: 79.6 % — ABNORMAL HIGH (ref 38.4–76.8)
NEUTROS ABS: 5.6 10*3/uL (ref 1.5–6.5)
Platelets: 235 10*3/uL (ref 145–400)
RBC: 3.62 10*6/uL — AB (ref 3.70–5.45)
RDW: 19.7 % — ABNORMAL HIGH (ref 11.2–14.5)
WBC: 7 10*3/uL (ref 3.9–10.3)
lymph#: 1.1 10*3/uL (ref 0.9–3.3)

## 2016-08-10 LAB — COMPREHENSIVE METABOLIC PANEL
ALT: 17 U/L (ref 0–55)
AST: 13 U/L (ref 5–34)
Albumin: 3.5 g/dL (ref 3.5–5.0)
Alkaline Phosphatase: 89 U/L (ref 40–150)
Anion Gap: 11 mEq/L (ref 3–11)
BUN: 24.6 mg/dL (ref 7.0–26.0)
CO2: 28 meq/L (ref 22–29)
Calcium: 9.6 mg/dL (ref 8.4–10.4)
Chloride: 103 mEq/L (ref 98–109)
Creatinine: 1 mg/dL (ref 0.6–1.1)
EGFR: 62 mL/min/{1.73_m2} — AB (ref >90)
GLUCOSE: 168 mg/dL — AB (ref 70–140)
POTASSIUM: 3.8 meq/L (ref 3.5–5.1)
Sodium: 142 mEq/L (ref 136–145)
Total Bilirubin: 0.39 mg/dL (ref 0.20–1.20)
Total Protein: 7 g/dL (ref 6.4–8.3)

## 2016-08-10 MED ORDER — LORATADINE 10 MG PO TABS
10.0000 mg | ORAL_TABLET | Freq: Once | ORAL | Status: AC
  Administered 2016-08-10: 10 mg via ORAL
  Filled 2016-08-10: qty 1

## 2016-08-10 MED ORDER — FAMOTIDINE IN NACL 20-0.9 MG/50ML-% IV SOLN
20.0000 mg | Freq: Once | INTRAVENOUS | Status: AC
  Administered 2016-08-10: 20 mg via INTRAVENOUS

## 2016-08-10 MED ORDER — FAMOTIDINE IN NACL 20-0.9 MG/50ML-% IV SOLN
INTRAVENOUS | Status: AC
  Filled 2016-08-10: qty 50

## 2016-08-10 MED ORDER — SODIUM CHLORIDE 0.9% FLUSH
10.0000 mL | INTRAVENOUS | Status: DC | PRN
  Administered 2016-08-10: 10 mL
  Filled 2016-08-10: qty 10

## 2016-08-10 MED ORDER — DEXAMETHASONE SODIUM PHOSPHATE 10 MG/ML IJ SOLN
INTRAMUSCULAR | Status: AC
  Filled 2016-08-10: qty 1

## 2016-08-10 MED ORDER — HEPARIN SOD (PORK) LOCK FLUSH 100 UNIT/ML IV SOLN
500.0000 [IU] | Freq: Once | INTRAVENOUS | Status: AC | PRN
  Administered 2016-08-10: 500 [IU]
  Filled 2016-08-10: qty 5

## 2016-08-10 MED ORDER — SODIUM CHLORIDE 0.9% FLUSH
10.0000 mL | Freq: Once | INTRAVENOUS | Status: AC
  Administered 2016-08-10: 10 mL
  Filled 2016-08-10: qty 10

## 2016-08-10 MED ORDER — DEXAMETHASONE SODIUM PHOSPHATE 10 MG/ML IJ SOLN
10.0000 mg | Freq: Once | INTRAMUSCULAR | Status: AC
  Administered 2016-08-10: 10 mg via INTRAVENOUS

## 2016-08-10 MED ORDER — SODIUM CHLORIDE 0.9 % IV SOLN
Freq: Once | INTRAVENOUS | Status: AC
  Administered 2016-08-10: 14:00:00 via INTRAVENOUS

## 2016-08-10 MED ORDER — PALONOSETRON HCL INJECTION 0.25 MG/5ML
0.2500 mg | Freq: Once | INTRAVENOUS | Status: DC
Start: 2016-08-10 — End: 2016-08-10

## 2016-08-10 MED ORDER — SODIUM CHLORIDE 0.9 % IV SOLN
80.0000 mg/m2 | Freq: Once | INTRAVENOUS | Status: AC
  Administered 2016-08-10: 168 mg via INTRAVENOUS
  Filled 2016-08-10: qty 28

## 2016-08-10 MED ORDER — PALONOSETRON HCL INJECTION 0.25 MG/5ML
INTRAVENOUS | Status: AC
  Filled 2016-08-10: qty 5

## 2016-08-10 NOTE — Patient Instructions (Signed)
[facility] Discharge Instructions for Patients Receiving Chemotherapy  Today you received the following chemotherapy agents  Taxol   To help prevent nausea and vomiting after your treatment, we encourage you to take your nausea medication as directed. No Zofran for 3 days. Take Compazine instead.    If you develop nausea and vomiting that is not controlled by your nausea medication, call the clinic.   BELOW ARE SYMPTOMS THAT SHOULD BE REPORTED IMMEDIATELY:  *FEVER GREATER THAN 100.5 F  *CHILLS WITH OR WITHOUT FEVER  NAUSEA AND VOMITING THAT IS NOT CONTROLLED WITH YOUR NAUSEA MEDICATION  *UNUSUAL SHORTNESS OF BREATH  *UNUSUAL BRUISING OR BLEEDING  TENDERNESS IN MOUTH AND THROAT WITH OR WITHOUT PRESENCE OF ULCERS  *URINARY PROBLEMS  *BOWEL PROBLEMS  UNUSUAL RASH Items with * indicate a potential emergency and should be followed up as soon as possible.  Feel free to call the clinic you have any questions or concerns. The clinic phone number is [phone].  Please show the [place] at check-in to the Emergency Department and triage nurse.

## 2016-08-12 ENCOUNTER — Encounter: Payer: Self-pay | Admitting: Hematology

## 2016-08-13 ENCOUNTER — Encounter (HOSPITAL_COMMUNITY): Payer: Self-pay | Admitting: Cardiology

## 2016-08-13 ENCOUNTER — Telehealth: Payer: Self-pay

## 2016-08-13 ENCOUNTER — Ambulatory Visit (HOSPITAL_BASED_OUTPATIENT_CLINIC_OR_DEPARTMENT_OTHER)
Admission: RE | Admit: 2016-08-13 | Discharge: 2016-08-13 | Disposition: A | Discharge status: Home / Self Care | Payer: 59 | Source: Ambulatory Visit | Attending: Cardiology | Admitting: Cardiology

## 2016-08-13 ENCOUNTER — Ambulatory Visit (HOSPITAL_COMMUNITY)
Admission: RE | Admit: 2016-08-13 | Discharge: 2016-08-13 | Disposition: A | Discharge status: Home / Self Care | Payer: 59 | Source: Ambulatory Visit | Attending: Internal Medicine | Admitting: Internal Medicine

## 2016-08-13 VITALS — BP 128/80 | HR 88 | Wt 202.8 lb

## 2016-08-13 DIAGNOSIS — Z7984 Long term (current) use of oral hypoglycemic drugs: Secondary | ICD-10-CM | POA: Diagnosis not present

## 2016-08-13 DIAGNOSIS — Z800 Family history of malignant neoplasm of digestive organs: Secondary | ICD-10-CM | POA: Diagnosis not present

## 2016-08-13 DIAGNOSIS — E119 Type 2 diabetes mellitus without complications: Secondary | ICD-10-CM | POA: Diagnosis not present

## 2016-08-13 DIAGNOSIS — C50412 Malignant neoplasm of upper-outer quadrant of left female breast: Secondary | ICD-10-CM | POA: Insufficient documentation

## 2016-08-13 DIAGNOSIS — K5190 Ulcerative colitis, unspecified, without complications: Secondary | ICD-10-CM | POA: Diagnosis not present

## 2016-08-13 DIAGNOSIS — Z171 Estrogen receptor negative status [ER-]: Secondary | ICD-10-CM | POA: Diagnosis not present

## 2016-08-13 DIAGNOSIS — Z79899 Other long term (current) drug therapy: Secondary | ICD-10-CM | POA: Diagnosis not present

## 2016-08-13 DIAGNOSIS — Z9221 Personal history of antineoplastic chemotherapy: Secondary | ICD-10-CM | POA: Insufficient documentation

## 2016-08-13 DIAGNOSIS — Z87891 Personal history of nicotine dependence: Secondary | ICD-10-CM | POA: Insufficient documentation

## 2016-08-13 DIAGNOSIS — I10 Essential (primary) hypertension: Secondary | ICD-10-CM | POA: Diagnosis not present

## 2016-08-13 DIAGNOSIS — Z808 Family history of malignant neoplasm of other organs or systems: Secondary | ICD-10-CM | POA: Insufficient documentation

## 2016-08-13 NOTE — Progress Notes (Signed)
Oncologist: [name]  56 yo referred by [name] with history of type II diabetes, HTN, ulcerative colitis and breast cancer presents for cardio-oncology evaluation.   Breast cancer was diagnosed 3/18, ER-/PR-/HER2+.  She had docetaxel/carboplatin/Herceptin/Perjeta on 05/11/16 for 4 cycles, stopped due to severe diarrhea/dehydration/UC flare.  Currently getting weekly docetaxel and Herceptin every 3 wks.  She will have surgery and radiation after chemotherapy.    At baseline, no exertional dyspnea or chest pain.  No history of cardiac disease. She is a nonsmoker.  Diarrhea has resolved.   Labs (2/18): K 4.7, creatinine 0.76 Labs (7/18): K 3.8, creatinine 1.0  PMH: 1. Type II diabetes 2. HTN 3. Ulcerative colitis 4. Breast cancer: Diagnosed 3/18, ER-/PR-/HER2+.  She had docetaxel/carboplatin/Herceptin/Perjeta x 4 cycles.  Now getting weekly docetaxel and q3 wk Herceptin.  She will have surgery and radiation after chemotherapy.   - Echo (4/18): EF 55-60%, GLS -13.3%, normal RV size and systolic function (technically difficult study).  - Echo (7/18): EF 60-65%, GLS -18.6%, normal RV size and systolic function.   Social History   Social History  . Marital status: Married    Spouse name: N/A  . Number of children: N/A  . Years of education: N/A   Occupational History  . Not on file.   Social History Main Topics  . Smoking status: Former Smoker    Packs/day: 1.00    Years: 29.00    Types: Cigarettes    Quit date: 10/27/2002  . Smokeless tobacco: Never Used  . Alcohol use No  . Drug use: No  . Sexual activity: Yes    Birth control/ protection: Surgical   Other Topics Concern  . Not on file   Social History Narrative  . No narrative on file   Family History  Problem Relation Age of Onset  . Colon cancer Father   . Stomach cancer Paternal Uncle   . Stomach cancer Paternal Uncle   . Melanoma Brother   . Thyroid cancer Brother   . Breast cancer Maternal Aunt   . Breast cancer  Maternal Aunt   . Breast cancer Cousin    ROS: All systems reviewed and negative except as per HPI.   Current Outpatient Prescriptions  Medication Sig Dispense Refill  . albuterol (PROVENTIL HFA;VENTOLIN HFA) 108 (90 Base) MCG/ACT inhaler Inhale 1-2 puffs into the lungs every 6 (six) hours as needed for wheezing or shortness of breath.    . dexamethasone (DECADRON) 4 MG tablet Take 1 tablet (4 mg total) by mouth daily. Start the day before Taxotere. Then again the day after chemo for 3 days. 30 tablet 1  . dicyclomine (BENTYL) 20 MG tablet Take 1 tablet (20 mg total) by mouth 4 (four) times daily -  before meals and at bedtime. 30 tablet 0  . diphenoxylate-atropine (LOMOTIL) 2.5-0.025 MG tablet Take one to two tablets every six hours as needed for diarrhea 120 tablet 1  . ferrous sulfate (FERROUSUL) 325 (65 FE) MG tablet Take 1 tablet (325 mg total) by mouth 3 (three) times daily with meals.    . gabapentin (NEURONTIN) 100 MG capsule Take 200 mg by mouth at bedtime.     . hydrocortisone (ANUSOL-HC) 25 MG suppository Place 1 suppository (25 mg total) rectally 2 (two) times daily. 12 suppository 0  . lidocaine-prilocaine (EMLA) cream Apply 1 application to skin 1.5 to 2 hrs before use.  Cover to secure cream with plastic wrap. 30 g PRN  . loperamide (IMODIUM) 1 MG/5ML solution  Take 3 mg by mouth as needed for diarrhea or loose stools.    [name] Kitchen LORazepam (ATIVAN) 0.5 MG tablet Take 1 tablet (0.5 mg total) by mouth once as needed for anxiety. 2 tablet 0  . magnesium oxide (MAG-OX) 400 MG tablet Take 400 mg by mouth 2 (two) times daily.    . Mesalamine (ASACOL HD) 800 MG TBEC Take 2 tablets (1,600 mg total) by mouth 3 (three) times daily. 90 tablet 5  . metFORMIN (GLUCOPHAGE-XR) 500 MG 24 hr tablet Take 1,000 mg by mouth at bedtime.    . metoprolol (LOPRESSOR) 50 MG tablet Take 50 mg by mouth at bedtime.     . niacin 250 MG tablet Take 250 mg by mouth at bedtime.     . ondansetron (ZOFRAN) 8 MG tablet  Take 1 tablet (8 mg total) by mouth 2 (two) times daily as needed for refractory nausea / vomiting. Start on day 3 after chemo. 30 tablet 1  . potassium chloride SA (K-DUR,KLOR-CON) 20 MEQ tablet Take 1 tablet (20 mEq total) by mouth 2 (two) times daily. 60 tablet 1  . predniSONE (DELTASONE) 20 MG tablet Take 30 mg by mouth daily.     . prochlorperazine (COMPAZINE) 10 MG tablet Take 1 tablet (10 mg total) by mouth every 6 (six) hours as needed (Nausea or vomiting). 30 tablet 1  . Specialty Vitamins Products (VITAMINS FOR THE HAIR) TABS Take 2 tablets by mouth 2 (two) times daily.    . traMADol (ULTRAM) 50 MG tablet Take 1-2 tablets (50-100 mg total) by mouth every 6 (six) hours as needed. 40 tablet 0  . triamterene-hydrochlorothiazide (MAXZIDE) 75-50 MG per tablet Take 0.5 tablets by mouth daily.      No current facility-administered medications for this encounter.    Facility-Administered Medications Ordered in Other Encounters  Medication Dose Route Frequency Provider Last Rate Last Dose  . heparin lock flush 100 unit/mL  500 Units Intracatheter Once PRN Truitt Merle, MD      . sodium chloride flush (NS) 0.9 % injection 10 mL  10 mL Intracatheter PRN Truitt Merle, MD         BP 128/80   Pulse 88   Wt 202 lb 12 oz (92 kg)   SpO2 100%   BMI 34.26 kg/m  General: NAD Neck: JVP 7 cm, no thyromegaly or thyroid nodule.  Lungs: CTAB CV: Nondisplaced PMI.  Heart regular S1/S2, no S3/S4, no murmur.  No peripheral edema.  No carotid bruit.  Normal pedal pulses.  Abdomen: Soft, nontender, no hepatosplenomegaly, no distention.  Skin: Intact without lesions or rashes.  Neurologic: Alert and oriented x 3.  Psych: Normal affect. Extremities: No clubbing or cyanosis.  HEENT: Normal.   Assessment/Plan: 1. Breast cancer: HER2+ breast cancer.  She is getting a Herceptin-based regimen.  Initial echo was a technically difficult study.  I reviewed today's echo, it is of better quality.  EF and strain appear  preserved.  - Continue Herceptin, repeat echo in 3 months.  2. HTN: BP is controlled.   Followup in 3 months with echo.    Loralie Champagne 08/13/2016

## 2016-08-13 NOTE — Progress Notes (Signed)
  Echocardiogram 2D Echocardiogram has been performed.  Brandi Dickson 08/13/2016, 10:54 AM

## 2016-08-13 NOTE — Patient Instructions (Signed)
We will contact you in 3 months to schedule your next appointment and echocardiogram  

## 2016-08-13 NOTE — Telephone Encounter (Signed)
I spoke with the patient and she is taking 30 mg of prednisone prescribed by Dr. Marjean Donna for some pulmonary issues she has had while on chemo.  She would really like to reduce the dose, she is advised to reach out to Dr. Olen Pel about instructions for taper.  From a UC standpoint she is doing very well and has no current GI complaints.

## 2016-08-13 NOTE — Telephone Encounter (Signed)
-----   Message from Ladene Artist, MD sent at 08/13/2016  6:42 AM EDT ----- Regarding: RE: Brandi Dickson,  She was no longer taking prednisone for her UC when I saw her 1 week ago. Your office note says Dr Olen Pel prescribed prednisone. We will call her today to get more information. If her prednisone is for a non GI use then will defer to the prescribing provider.   Norberto Sorenson ----- Message ----- From: Truitt Merle, MD Sent: 08/12/2016  11:35 PM To: Ladene Artist, MD  Dr. Fuller Plan,  Her diarrhea has nearly resolved, since I held her chemotherapy last week. She is now on weekly Taxol, which will probably not cause much diarrhea.   She is still on prednisone, could you guild her tapering?   Thanks   Brandi Dickson

## 2016-08-15 ENCOUNTER — Other Ambulatory Visit: Payer: Self-pay | Admitting: Hematology

## 2016-08-15 ENCOUNTER — Ambulatory Visit (HOSPITAL_COMMUNITY)
Admission: RE | Admit: 2016-08-15 | Discharge: 2016-08-15 | Disposition: A | Discharge status: Home / Self Care | Payer: 59 | Source: Ambulatory Visit | Attending: Hematology | Admitting: Hematology

## 2016-08-15 DIAGNOSIS — K769 Liver disease, unspecified: Secondary | ICD-10-CM | POA: Insufficient documentation

## 2016-08-15 DIAGNOSIS — D3502 Benign neoplasm of left adrenal gland: Secondary | ICD-10-CM | POA: Insufficient documentation

## 2016-08-15 DIAGNOSIS — D1779 Benign lipomatous neoplasm of other sites: Secondary | ICD-10-CM | POA: Insufficient documentation

## 2016-08-15 DIAGNOSIS — K7689 Other specified diseases of liver: Secondary | ICD-10-CM | POA: Diagnosis not present

## 2016-08-15 MED ORDER — GADOBENATE DIMEGLUMINE 529 MG/ML IV SOLN
20.0000 mL | Freq: Once | INTRAVENOUS | Status: AC | PRN
  Administered 2016-08-15: 20 mL via INTRAVENOUS

## 2016-08-17 ENCOUNTER — Ambulatory Visit (HOSPITAL_BASED_OUTPATIENT_CLINIC_OR_DEPARTMENT_OTHER): Payer: 59

## 2016-08-17 ENCOUNTER — Ambulatory Visit: Payer: 59

## 2016-08-17 ENCOUNTER — Other Ambulatory Visit (HOSPITAL_BASED_OUTPATIENT_CLINIC_OR_DEPARTMENT_OTHER): Payer: 59

## 2016-08-17 VITALS — BP 138/90 | HR 80 | Temp 97.9°F | Resp 18

## 2016-08-17 DIAGNOSIS — Z5111 Encounter for antineoplastic chemotherapy: Secondary | ICD-10-CM

## 2016-08-17 DIAGNOSIS — Z171 Estrogen receptor negative status [ER-]: Principal | ICD-10-CM

## 2016-08-17 DIAGNOSIS — Z95828 Presence of other vascular implants and grafts: Secondary | ICD-10-CM

## 2016-08-17 DIAGNOSIS — C50412 Malignant neoplasm of upper-outer quadrant of left female breast: Secondary | ICD-10-CM

## 2016-08-17 LAB — CBC WITH DIFFERENTIAL/PLATELET
BASO%: 0 % (ref 0.0–2.0)
Basophils Absolute: 0 10*3/uL (ref 0.0–0.1)
EOS ABS: 0 10*3/uL (ref 0.0–0.5)
EOS%: 0 % (ref 0.0–7.0)
HCT: 34.8 % (ref 34.8–46.6)
HEMOGLOBIN: 11.3 g/dL — AB (ref 11.6–15.9)
LYMPH%: 24.1 % (ref 14.0–49.7)
MCH: 32.5 pg (ref 25.1–34.0)
MCHC: 32.5 g/dL (ref 31.5–36.0)
MCV: 100 fL (ref 79.5–101.0)
MONO#: 0.2 10*3/uL (ref 0.1–0.9)
MONO%: 3.8 % (ref 0.0–14.0)
NEUT%: 72.1 % (ref 38.4–76.8)
NEUTROS ABS: 4.2 10*3/uL (ref 1.5–6.5)
PLATELETS: 293 10*3/uL (ref 145–400)
RBC: 3.48 10*6/uL — ABNORMAL LOW (ref 3.70–5.45)
RDW: 16.4 % — AB (ref 11.2–14.5)
WBC: 5.8 10*3/uL (ref 3.9–10.3)
lymph#: 1.4 10*3/uL (ref 0.9–3.3)

## 2016-08-17 LAB — COMPREHENSIVE METABOLIC PANEL
ALK PHOS: 98 U/L (ref 40–150)
ALT: 21 U/L (ref 0–55)
AST: 12 U/L (ref 5–34)
Albumin: 3.6 g/dL (ref 3.5–5.0)
Anion Gap: 12 mEq/L — ABNORMAL HIGH (ref 3–11)
BILIRUBIN TOTAL: 0.28 mg/dL (ref 0.20–1.20)
BUN: 28.1 mg/dL — ABNORMAL HIGH (ref 7.0–26.0)
CO2: 26 mEq/L (ref 22–29)
Calcium: 9.5 mg/dL (ref 8.4–10.4)
Chloride: 101 mEq/L (ref 98–109)
Creatinine: 0.9 mg/dL (ref 0.6–1.1)
EGFR: 76 mL/min/{1.73_m2} — AB (ref >90)
GLUCOSE: 154 mg/dL — AB (ref 70–140)
Potassium: 3.6 mEq/L (ref 3.5–5.1)
Sodium: 139 mEq/L (ref 136–145)
TOTAL PROTEIN: 6.8 g/dL (ref 6.4–8.3)

## 2016-08-17 MED ORDER — SODIUM CHLORIDE 0.9% FLUSH
10.0000 mL | INTRAVENOUS | Status: DC | PRN
  Administered 2016-08-17: 10 mL
  Filled 2016-08-17: qty 10

## 2016-08-17 MED ORDER — DEXAMETHASONE SODIUM PHOSPHATE 10 MG/ML IJ SOLN
INTRAMUSCULAR | Status: AC
  Filled 2016-08-17: qty 1

## 2016-08-17 MED ORDER — SODIUM CHLORIDE 0.9 % IV SOLN
Freq: Once | INTRAVENOUS | Status: AC
  Administered 2016-08-17: 16:00:00 via INTRAVENOUS

## 2016-08-17 MED ORDER — HEPARIN SOD (PORK) LOCK FLUSH 100 UNIT/ML IV SOLN
500.0000 [IU] | Freq: Once | INTRAVENOUS | Status: AC | PRN
  Administered 2016-08-17: 500 [IU]
  Filled 2016-08-17: qty 5

## 2016-08-17 MED ORDER — LORATADINE 10 MG PO TABS
10.0000 mg | ORAL_TABLET | Freq: Once | ORAL | Status: AC
  Administered 2016-08-17: 10 mg via ORAL
  Filled 2016-08-17: qty 1

## 2016-08-17 MED ORDER — SODIUM CHLORIDE 0.9 % IV SOLN
80.0000 mg/m2 | Freq: Once | INTRAVENOUS | Status: AC
  Administered 2016-08-17: 168 mg via INTRAVENOUS
  Filled 2016-08-17: qty 28

## 2016-08-17 MED ORDER — SODIUM CHLORIDE 0.9% FLUSH
10.0000 mL | Freq: Once | INTRAVENOUS | Status: AC
  Administered 2016-08-17: 10 mL
  Filled 2016-08-17: qty 10

## 2016-08-17 MED ORDER — DEXAMETHASONE SODIUM PHOSPHATE 10 MG/ML IJ SOLN
10.0000 mg | Freq: Once | INTRAMUSCULAR | Status: AC
  Administered 2016-08-17: 10 mg via INTRAVENOUS

## 2016-08-17 MED ORDER — FAMOTIDINE IN NACL 20-0.9 MG/50ML-% IV SOLN
INTRAVENOUS | Status: AC
  Filled 2016-08-17: qty 50

## 2016-08-17 MED ORDER — FAMOTIDINE IN NACL 20-0.9 MG/50ML-% IV SOLN
20.0000 mg | Freq: Once | INTRAVENOUS | Status: AC
  Administered 2016-08-17: 20 mg via INTRAVENOUS

## 2016-08-17 NOTE — Patient Instructions (Signed)

## 2016-08-17 NOTE — Patient Instructions (Signed)
[facility] Discharge Instructions for Patients Receiving Chemotherapy  Today you received the following chemotherapy agents  Taxol   To help prevent nausea and vomiting after your treatment, we encourage you to take your nausea medication as directed. No Zofran for 3 days. Take Compazine instead.    If you develop nausea and vomiting that is not controlled by your nausea medication, call the clinic.   BELOW ARE SYMPTOMS THAT SHOULD BE REPORTED IMMEDIATELY:  *FEVER GREATER THAN 100.5 F  *CHILLS WITH OR WITHOUT FEVER  NAUSEA AND VOMITING THAT IS NOT CONTROLLED WITH YOUR NAUSEA MEDICATION  *UNUSUAL SHORTNESS OF BREATH  *UNUSUAL BRUISING OR BLEEDING  TENDERNESS IN MOUTH AND THROAT WITH OR WITHOUT PRESENCE OF ULCERS  *URINARY PROBLEMS  *BOWEL PROBLEMS  UNUSUAL RASH Items with * indicate a potential emergency and should be followed up as soon as possible.  Feel free to call the clinic you have any questions or concerns. The clinic phone number is [phone].  Please show the CHEMO ALERT CARD at check-in to the Emergency Department and triage nurse.   

## 2016-08-23 NOTE — Progress Notes (Signed)
[place]  [phone] Fax:([phone]) [phone]  Clinic Follow Up Note   Patient Care Team: Orpah Melter, MD as PCP - General (Family Medicine) [name], MD as Consulting Physician (General Surgery) Truitt Merle, MD as Consulting Physician (Hematology) Gery Pray, MD as Consulting Physician (Radiation Oncology) Ladene Artist, MD as Consulting Physician (Gastroenterology) 08/24/2016  CHIEF COMPLAINTS:  Follow up left breast cancer  Oncology History   Cancer Staging Breast cancer of upper-outer quadrant of left female breast [facility]) Staging form: Breast, AJCC 8th Edition - Clinical stage from 04/25/2016: Stage IIA (cT2, cN0, cM0, G3, ER: Negative, PR: Negative, HER2: Positive) - Signed by Truitt Merle, MD on 05/02/2016       Breast cancer of upper-outer quadrant of left female breast ([name])   04/24/2016 Mammogram    Category B breasts with a new irregular mass in the UOQ left breast middle depth. Ultrasound revealed a 3.9 cm mass in the 1:00 position. The left axilla was negative.       04/25/2016 Initial Biopsy    Biopsy of the left breast showed grade 3 invasive ductal carcinoma, DCIS, and lymphovascular invasion was present.      04/25/2016 Receptors her2    ER 0% negative, PR 0% negative, HER2 positive, Ki67 30%      05/02/2016 Initial Diagnosis    Breast cancer of upper-outer quadrant of left female breast ([place])     05/07/2016 Imaging    MRI of the bilateral breast 05/07/16 IMPRESSION: Lobulated enhancing mass (4.0 x 2.7 x 3.1 cm) in the upper-outer quadrant of the left breast corresponding with the recently diagnosed invasive mammary carcinoma. Linear enhancement extends 2.6 cm posterior to the mass worrisome for ductal carcinoma in-situ.      05/09/2016 Echocardiogram    Echo 05/09/16 -LF EF: 55-60%      05/11/2016 -  Neo-Adjuvant Chemotherapy    Docetaxel, Carboplatin, Herceptin and pejeta (TCHP) every 3 weeks, for total of 6 cycles,  followed by Herceptin and perjeta maintenance therapy to complete 1 year treatment   starting 05/11/16; Docetaxel changed to Taxol, carbo dose reduced to AUC 4.5 (from 5)  from cycle 2 due to severe diarrhea and cytopenia, on 06/01/16. Will hold Perjeta and add herceptin for cycle 3  Due to poor toleration we will proceed with weekly taxol starting 08/10/16, no neulasta  Start Herceptin every 3 weeks for the next year starting 08/03/16       05/13/2016 Imaging    CT Angio Chest PE IMPRESSION: No evidence of pulmonary emboli. Left breast mass consistent with the given clinical history. Stable left adrenal lesion likely representing a small adenoma.      05/18/2016 - 05/24/2016 Hospital Admission    Patient presented with nausea, vomiting, and diarrhea; admitted to hospital with Hyponatremia      06/07/2016 - 06/09/2016 Hospital Admission    Patient presents to hospital complaints of rectal bleeding and abdominal cramps when defacating      06/07/2016 Imaging    CT ABDOMEN PELVIS W CONTRAST  IMPRESSION: 1. Wall thickening of the descending and sigmoid colon consistent with an infectious or inflammatory colitis. 2. There is also a short segment of narrowing and possible wall thickening of the superior ascending colon which could reflect a constricting mass or, more likely, be an area of persistent colonic spasm. This could be further assessed with either colonoscopy or a barium enema after the current symptoms of colitis have resolved. 3. Small, subcentimeter, low-density liver lesions which may  all be benign. However, 3 these are not evident on prior CT. Liver metastatic disease possible. These could be further assessed with liver MRI with and without contrast. 4. Adrenal lesions which are stable, on the right and myelolipoma and on the left most likely an adenoma.      06/07/2016 Imaging    CT A/P IMPRESSION: 1. Wall thickening of the descending and sigmoid colon consistent with an  infectious or inflammatory colitis. 2. There is also a short segment of narrowing and possible wall thickening of the superior ascending colon which could reflect a constricting mass or, more likely, be an area of persistent colonic spasm. This could be further assessed with either colonoscopy or a barium enema after the current symptoms of colitis have resolved. 3. Small, subcentimeter, low-density liver lesions which may all be benign. However, 3 these are not evident on prior CT. Liver metastatic disease possible. These could be further assessed with liver MRI with and without contrast. 4. Adrenal lesions which are stable, on the right and myelolipoma and on the left most likely an adenoma.      06/07/2016 - 06/09/2016 Hospital Admission    Diarrhea and rectal Bleeding      07/19/2016 - 07/20/2016 Hospital Admission    Diarrhea and dehydration      08/09/2016 Mammogram    Mammogram 08/09/16 IMPRESSION:  The 3.1 cm x 2 cm x 1.5 cm irregular equal density mass in the left breast is consistent with the known carcinoma showing mammographic evidence of preoperative chemotherapy response.         08/09/2016 Imaging    [place] of left breast 08/09/16 IMPRESSION:  The 2.7 cm lobulated mass in the left breast is a known biopsy positive for malignancy. Surgical and oncology consult in progress.      08/16/2016 Imaging    MRI Abdomen W WO Contrast IMPRESSION: Tiny sub-cm hepatic cysts. No evidence of metastatic disease or other acute findings. Tiny benign left adrenal adenoma and right adrenal myelolipoma.       HISTORY OF PRESENTING ILLNESS (05/02/16):  Brandi Dickson 56 y.o. female is here because of a new diagnosis of left breast cancer. She is accompanied by her husband to our multidisciplinary breast clinic today.  The patient presented with a palpable left breast lump approximately 2 weeks ago. Bilateral diagnostic mammogram on 04/24/16 showed Category B breasts with a new irregular  mass in the UOQ left breast middle depth. Ultrasound performed on 04/24/16 revealed a 3.9 cm mass in the 1:00 position. The left axilla was negative.  Biopsy of the left breast on 04/25/16 showed grade 3 invasive ductal carcinoma, DCIS, and lymphovascular invasion was present (ER 0% negative, PR 0% negative, HER2 positive, Ki67 30%).  She denies tenderness of the biopsied area. Reports minor dimpling. The patient is 8 weeks out from a left hip replacement. She had a right hip replacement 2010 years ago.  The patient and her husband present today in multidisciplinary breast clinic to discuss treatment options for the management of her disease.  The patient is taking Neurontin for neuropathy in her feet from diabetes. She has been diagnosed for diabetes for the past 4 years. She states she only has neuropathy at night and take 2 Neurontin at night and then has no symptoms.  GYN HISTORY  Menarchal: 9 LMP: Complete hysterectomy for uterine fibroids ~ 2009. She was bleeding too much with her last menstrual cycle lasting 6 weeks. No issues with menopause. Contraceptive: no  HRT: No  GP: G1P0  CURRENT THERAPY: Docetaxel, Carboplatin, Herceptin and pejeta (TCHP) every 3 weeks, for total of 6 cycles, followed by Herceptin and perjeta maintenance therapy to complete 1 year treatment started 05/11/16; Docetaxel changed to Taxol, carbo dose reduced to AUC 4.5 (from 5)  from cycle 2 due to severe diarrhea and cytopenia, on 06/01/16. Will hold Perjeta from cycle 3. Chemo changed to weekly Taxol 08/10/16 due to poor tolerance    INTERVAL HISTORY:  MELVINA PANGELINAN returns for follow up. She is currently feeling well and is tolerating her new regimen well. She was recently switched to Taxol as she was not tolerating her previous regimen well; she has had two doses of this so far. She reports that her stools have been softer recently; no discrete diarrhea. No nausea, vomiting. She also endorses some neuropathy to the  right 2nd-4th digits, paraesthesias at rest and pain to the joints precipitated with typing. Left hand is otherwise benign. She does also experience neuropathy to the feet; this was present before chemo and states that it is somewhat worse now. She is currently taking 22m Prednisone daily; was on 360mlast week. She is weaning herself off of this. Originally began this medication in April.   MEDICAL HISTORY:  Past Medical History:  Diagnosis Date  . Arthritis   . Asthma    triggered with EsMindi Curlingerfumes and cigarette smoke  . Cancer ([facility]  . Colitis   . [place] without complication ([place]  . Hypertension   . Neuropathy     SURGICAL HISTORY: Past Surgical History:  Procedure Laterality Date  . ABDOMINAL HYSTERECTOMY    . DILATION AND CURETTAGE OF UTERUS    . PORTACATH PLACEMENT Right 05/08/2016   Procedure: INSERTION PORT-A-CATH WITH [place] Surgeon: DaAlphonsa OverallMD;  [facility] Service: General;  Laterality: Right;  . TONSILLECTOMY    . TOTAL HIP ARTHROPLASTY Right   . TOTAL HIP ARTHROPLASTY Left 03/06/2016   Procedure: LEFT TOTAL HIP ARTHROPLASTY ANTERIOR APPROACH;  Surgeon: MaParalee CancelMD;  Location: [facility];  Service: Orthopedics;  Laterality: Left;    SOCIAL HISTORY: Social History   Social History  . Marital status: Married    Spouse name: N/A  . Number of children: N/A  . Years of education: N/A   Occupational History  . Not on file.   Social History Main Topics  . Smoking status: Former Smoker    Packs/day: 1.00    Years: 29.00    Types: Cigarettes    Quit date: 10/27/2002  . Smokeless tobacco: Never Used  . Alcohol use No  . Drug use: No  . Sexual activity: Yes    Birth control/ protection: Surgical   Other Topics Concern  . Not on file   Social History Narrative  . No narrative on file    FAMILY HISTORY: Family History  Problem Relation Age of Onset  . Colon cancer Father   . Stomach cancer Paternal Uncle    . Stomach cancer Paternal Uncle   . Melanoma Brother   . Thyroid cancer Brother   . Breast cancer Maternal Aunt   . Breast cancer Maternal Aunt   . Breast cancer Cousin     ALLERGIES:  is allergic to lisinopril; augmentin [amoxicillin-pot clavulanate]; and benadryl [diphenhydramine].  MEDICATIONS:  Current Outpatient Prescriptions  Medication Sig Dispense Refill  . albuterol (PROVENTIL HFA;VENTOLIN HFA) 108 (90 Base) MCG/ACT inhaler Inhale 1-2 puffs into the lungs every 6 (six)  hours as needed for wheezing or shortness of breath.    . dexamethasone (DECADRON) 4 MG tablet Take 1 tablet (4 mg total) by mouth daily. Start the day before Taxotere. Then again the day after chemo for 3 days. 30 tablet 1  . dicyclomine (BENTYL) 20 MG tablet Take 1 tablet (20 mg total) by mouth 4 (four) times daily -  before meals and at bedtime. 30 tablet 0  . diphenoxylate-atropine (LOMOTIL) 2.5-0.025 MG tablet Take one to two tablets every six hours as needed for diarrhea 120 tablet 1  . ferrous sulfate (FERROUSUL) 325 (65 FE) MG tablet Take 1 tablet (325 mg total) by mouth 3 (three) times daily with meals.    . gabapentin (NEURONTIN) 100 MG capsule Take 200 mg by mouth at bedtime.     . hydrocortisone (ANUSOL-HC) 25 MG suppository Place 1 suppository (25 mg total) rectally 2 (two) times daily. 12 suppository 0  . lidocaine-prilocaine (EMLA) cream Apply 1 application to skin 1.5 to 2 hrs before use.  Cover to secure cream with plastic wrap. 30 g PRN  . loperamide (IMODIUM) 1 MG/5ML solution Take 3 mg by mouth as needed for diarrhea or loose stools.    [name] Kitchen LORazepam (ATIVAN) 0.5 MG tablet Take 1 tablet (0.5 mg total) by mouth once as needed for anxiety. 2 tablet 0  . magnesium oxide (MAG-OX) 400 MG tablet Take 400 mg by mouth 2 (two) times daily.    . Mesalamine (ASACOL HD) 800 MG TBEC Take 2 tablets (1,600 mg total) by mouth 3 (three) times daily. 90 tablet 5  . metFORMIN (GLUCOPHAGE-XR) 500 MG 24 hr tablet Take  1,000 mg by mouth at bedtime.    . metoprolol (LOPRESSOR) 50 MG tablet Take 50 mg by mouth at bedtime.     . niacin 250 MG tablet Take 250 mg by mouth at bedtime.     . ondansetron (ZOFRAN) 8 MG tablet Take 1 tablet (8 mg total) by mouth 2 (two) times daily as needed for refractory nausea / vomiting. Start on day 3 after chemo. 30 tablet 1  . potassium chloride SA (K-DUR,KLOR-CON) 20 MEQ tablet Take 1 tablet (20 mEq total) by mouth 2 (two) times daily. 60 tablet 1  . predniSONE (DELTASONE) 20 MG tablet Take 30 mg by mouth daily.     . prochlorperazine (COMPAZINE) 10 MG tablet Take 1 tablet (10 mg total) by mouth every 6 (six) hours as needed (Nausea or vomiting). 30 tablet 1  . Specialty Vitamins Products (VITAMINS FOR THE HAIR) TABS Take 2 tablets by mouth 2 (two) times daily.    . traMADol (ULTRAM) 50 MG tablet Take 1-2 tablets (50-100 mg total) by mouth every 6 (six) hours as needed. 40 tablet 0  . triamterene-hydrochlorothiazide (MAXZIDE) 75-50 MG per tablet Take 0.5 tablets by mouth daily.      No current facility-administered medications for this visit.    Facility-Administered Medications Ordered in Other Visits  Medication Dose Route Frequency Provider Last Rate Last Dose  . heparin lock flush 100 unit/mL  500 Units Intracatheter Once PRN Truitt Merle, MD      . sodium chloride flush (NS) 0.9 % injection 10 mL  10 mL Intracatheter PRN Truitt Merle, MD      . sodium chloride flush (NS) 0.9 % injection 10 mL  10 mL Intracatheter PRN Truitt Merle, MD   10 mL at 08/24/16 1725    REVIEW OF SYSTEMS:   Constitutional: Denies fevers, chills or abnormal night  sweats (+) taste has returned  Eyes: Denies blurriness of vision, double vision or watery eyes Ears, nose, mouth, throat, and face: Denies mucositis or sore throat Respiratory: Denies cough, dyspnea or wheezes Cardiovascular: Denies palpitation, chest discomfort or lower extremity swelling Gastrointestinal:  Denies heartburn (+) loose stools,  diarrhea resolved, Denies nausea, vomiting.  Skin: Denies abnormal skin rashes Lymphatics: Denies new lymphadenopathy or easy bruising Neurological: (+) neuropathy in hands/feet Behavioral/Psych: Mood is stable, no new changes  All other systems were reviewed with the patient and are negative.  PHYSICAL EXAMINATION:  ECOG PERFORMANCE STATUS: 1 - Symptomatic but completely ambulatory  Vitals:   08/24/16 1342  BP: 118/69  Pulse: 87  Resp: 18  Temp: 97.7 F (36.5 C)   Filed Weights   08/24/16 1342  Weight: 209 lb 1.6 oz (94.8 kg)    GENERAL:alert, no distress and comfortable SKIN: skin color, texture, turgor are normal, no rashes or significant lesions EYES: normal, conjunctiva are pink and non-injected, sclera clear OROPHARYNX:no exudate, no erythema and lips, buccal mucosa, and tongue normal  NECK: supple, thyroid normal size, non-tender, without nodularity LYMPH:  no palpable lymphadenopathy in the cervical, axillary or inguinal LUNGS: clear to auscultation and percussion with normal breathing effort HEART: regular rate & rhythm and no murmurs and no lower extremity edema ABDOMEN:abdomen soft, non-tender and normal bowel sounds Musculoskeletal:no cyanosis of digits and no clubbing  PSYCH: alert & oriented x 3 with fluent speech NEURO: no focal motor/sensory deficits BREAST: Previous breast inspection showed them to be symmetrical with no nipple discharge. Left breast exam showed UOQ 2 x 2.5 cm reduced to 2 x 1.5 cm palpable mass, non tender, and smaller in size than previous exam. No palpable mass in the right breast and bilateral axilla.  LABORATORY DATA:  I have reviewed the data as listed CBC Latest Ref Rng & Units 08/24/2016 08/17/2016 08/10/2016  WBC 3.9 - 10.3 10e3/uL 7.7 5.8 7.0  Hemoglobin 11.6 - 15.9 g/dL 11.0(L) 11.3(L) 11.7  Hematocrit 34.8 - 46.6 % 33.3(L) 34.8 35.5  Platelets 145 - 400 10e3/uL 268 293 235   CMP Latest Ref Rng & Units 08/24/2016 08/17/2016  08/10/2016  Glucose 70 - 140 mg/dl 161(H) 154(H) 168(H)  BUN 7.0 - 26.0 mg/dL 23.1 28.1(H) 24.6  Creatinine 0.6 - 1.1 mg/dL 0.8 0.9 1.0  Sodium 136 - 145 mEq/L 141 139 142  Potassium 3.5 - 5.1 mEq/L 3.6 3.6 3.8  Chloride 101 - 111 mmol/L - - -  CO2 22 - 29 mEq/L _0 Calcium 8.4 - 10.4 mg/dL 9.3 9.5 9.6  Total Protein 6.4 - 8.3 g/dL 6.3(L) 6.8 7.0  Total Bilirubin 0.20 - 1.20 mg/dL 0.31 0.28 0.39  Alkaline Phos 40 - 150 U/L 75 98 89  AST 5 - 34 U/L _1 ALT 0 - 55 U/L _2 PATHOLOGY REPORT:   ADDITIONAL INFORMATION: 04/25/16 PROGNOSTIC INDICATORS Results: IMMUNOHISTOCHEMICAL AND MORPHOMETRIC ANALYSIS PERFORMED MANUALLY Estrogen Receptor: 0%, NEGATIVE Progesterone Receptor: 0%, NEGATIVE Proliferation Marker Ki67: 30% COMMENT: The negative hormone receptor study(ies) in this case has no internal positive control. REFERENCE RANGE ESTROGEN RECEPTOR NEGATIVE 0% POSITIVE =>1% REFERENCE RANGE PROGESTERONE RECEPTOR NEGATIVE 0% POSITIVE =>1% All controls stained appropriately Enid Cutter MD Pathologist, Electronic Signature ( Signed 05/01/2016) FLUORESCENCE IN-SITU HYBRIDIZATION Results: HER2 - **POSITIVE** RATIO OF HER2/CEP17 SIGNALS 2.25 AVERAGE HER2 COPY NUMBER PER CELL 8.45 1 of 3 FINAL for Alire, Emmajo P ([id number]) ADDITIONAL INFORMATION:(continued) Reference Range: NEGATIVE HER2/CEP17 Ratio <2.0  and average HER2 copy number <4.0 EQUIVOCAL HER2/CEP17 Ratio <2.0 and average HER2 copy number 4.0 and <6.0 POSITIVE HER2/CEP17 Ratio >=2.0 or <2.0 and average HER2 copy number >=6.0 Enid Cutter MD Pathologist, Electronic Signature ( Signed 04/30/2016) FINAL DIAGNOSIS Diagnosis Breast, left, needle core biopsy - INVASIVE DUCTAL CARCINOMA, SEE COMMENT. - DUCTAL CARCINOMA IN SITU. - LYMPHOVASCULAR INVASION PRESENT.  Microscopic Comment The carcinoma appears grade 3 with focal squamous differentiation. Prognostic markers will be ordered. Dr. Lyndon Code  has reviewed the case. The case was called to Dr. Isaiah Blakes on 04/26/2016. Vicente Males MD Pathologist, Electronic Signature (Case signed 04/26/2016)  RADIOGRAPHIC STUDIES: I have personally reviewed the radiological images as listed and agreed with the findings in the report.  MRI Abdomen W WO Contrast 08/16/16 IMPRESSION: Tiny sub-cm hepatic cysts. No evidence of metastatic disease or other acute findings. Tiny benign left adrenal adenoma and right adrenal myelolipoma.  Mammogram 08/09/16 IMPRESSION:  The 3.1 cm x 2 cm x 1.5 cm irregular equal density mass in the left breast is consistent with the known carcinoma showing mammographic evidence of preoperative chemotherapy response.   [place] of left breast 08/09/16 IMPRESSION:  The 2.7 cm lobulated mass in the left breast is a known biopsy positive for malignancy. Surgical and oncology consult in progress.    See onc history CT ABD/Pelvi 5/10/18s W CONTRAST 06/07/16 IMPRESSION: 1. Wall thickening of the descending and sigmoid colon consistent with an infectious or inflammatory colitis. 2. There is also a short segment of narrowing and possible wall thickening of the superior ascending colon which could reflect a constricting mass or, more likely, be an area of persistent colonic spasm. This could be further assessed with either colonoscopy or a barium enema after the current symptoms of colitis have resolved. 3. Small, subcentimeter, low-density liver lesions which may all be benign. However, 3 these are not evident on prior CT. Liver metastatic disease possible. These could be further assessed with liver MRI with and without contrast. 4. Adrenal lesions which are stable, on the right and myelolipoma and on the left most likely an adenoma.  CT Angio Chest PE W and/or wo Contrast 05/13/16 IMPRESSION: No evidence of pulmonary emboli.  Echo 05/09/16 Study Conclusions -LF EF: 55-60% - Left ventricle: The cavity size was normal. There  was mild focal   basal hypertrophy of the septum. Indeterminant diastolic   function. Systolic function was normal. The estimated ejection   fraction was in the range of 55% to 60%. Wall motion was normal;   there were no regional wall motion abnormalities. GLS abnormal at   -13.3%, poor images however.  ASSESSMENT & PLAN: 56 y.o. post-menopausal Caucasian female with a self palpated left breast mass.  1. Breast cancer of upper-outer quadrant of left breast, invasive ductal carcinoma,  stage IIA (cT2N0M0) grade 3, ER-, PR-, HER2 amplified -We previously reviewed the patient's imaging and pathology. -We previously reviewed her staging and biology of her breast cancer  -We previously discussed that surgical resection is the definitive treatment for breast cancer, she was seen by breast surgeon Dr. Lucia Gaskins today, lumpectomy versus mastectomy were discussed with patient. -We previously discussed HER2 positive breast cancers total approximately 15% of breast cancers and happens to be more aggressive than HER2 negative cancers, especially ER and PR negative disease, she has high likelihood of cancer recurrence after complete surgical resection.  -I recommend neoadjuvant chemotherapy TCHP (docetaxel, carboplatin, Herceptin and pejeta) every 3 weeks for 6 cycles, followed by maintenance Herceptin and pejeta to complete  1 year therapy -Her baseline echo was normal, she was seen by cardiologist Dr. Benjamine Mola -She unfortunately developed severe diarrhea and cytopenia after first cycle chemotherapy, was hospitalized for a week. She has now recovered well. -cycle 2 chemo docetaxel was changed to paclitaxel, carbo and pejeta dose was reduced, unfortunately she again developed worsening diarrhea and bleeding, required a few days hospitalization. -Perjeta has been held from cycle 3, she tolerated cycle 3 with carboplatin, paclitaxel and Herceptin well but developed dyspnea and was seen by PCP  -She again was  admitted to hospital for dehydration and diarrhea after cycle 4 chemotherapy -Due to her frequent hospitalization from chemotherapy-related complications, and she has received 4 cycles of chemo (planed for 6 cycles), I stopped cycle 5 carbo and taxol  - We previously reviewed her [place] and mammogram form 08/09/16 that shows partial response to chemotherapy, the mass had reduced from 4 cm to 2.7, cm no other new lesions.  -I previously suggest we move forward with low dose weekly taxol for the next 4-6 weeks with no neulasta. Granix, will be given if her counts become low. We discussed the side effects of Taxol, especially neuropathy and we will stop if her neuropathy get worse.  -We will continue Herceptin maintenance therapy  -Labs reviewed and her counts have recovered her Potassium is 3.8 today. Discussed with her to continue taking her potassium pill as this is mildly low.   -abdomen  MRI on 08/16/16 showed no evidence of metastatic disease; discussed the scan findings w/ her including the benign liver cyst and two benign tumors to bilateral adrenal glands and advised her that these do not warrant treatment. -She had pre-existing peripheral neuropathy from her diabetes, neuropathy in her right fingers has gotten worse since she started chemotherapy, exam today shows a decreased vibration sensation today, we'll reduce her Taxol to 70 mg/m, and stopped after next week's treatment (cancel last 2 weeks) -Plan to repeat a bilateral breast MRI after her last chemotherapy next week -We'll continue Herceptin maintenance therapy -will refer her back to Dr. Lucia Gaskins to consider surgery   2. Genetics -Given her strong family history of breast cancer, we recommend her to see genetic counseling to ruled out inheritable breast cancer syndrome. She agreed. -Genetic counseling scheduled  3. Type 2 Diabetes mellitus, HTN -Managed by her PCP. -The patient has peripheral neuropathy in her feet from her diabetes. The  patient is already on Neurontin with 200 mg at night. We previously discussed that chemotherapy may make her neuropathy worse. -Steroids will be given to reduce chemo side effects and I will reduce dexa to 28m daily to not affect her blood sugar much. -We'll monitor her blood glucose and blood pressure closely during her chemotherapy treatment.  4. Ulcerative colitis -We previously discussed that chemotherapy would cause diarrhea and the patient's colitis may exacerbate during chemo  -She is not taking steroids for this. She is on mesalamine, will follow up with Dr. SFuller Plan -She had severe diarrhea after first cycle chemotherapy, was treated for ulcerative colitis flare, she will follow-up with Dr. SFuller Plannext week. -I previously advised the patient to keep herself adequatly hydrated and to take Imodium PRN. - I again previously advised the patient to take Lomotil and Imodium frequently as needed. -Since change in chemo her diarrhea has resolved.  -She is still on Prednisone I suggest she should be tapered off her steroids. I will send note to Dr. SFuller Plan   5. Arthritis  -s/p b/l hip replacement, last  surgery in February 2018.   6. Neuropathy to the right 2nd-4th digits distally -New problem following beginning chemo. 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 I encouraged her to use her left hand more often and begin wearing cotton gloves.  -Encouraged taking B12 multivitamin to aid with this as well.  -She is currently on Gabapentin for her baseline neuropathy associated w/ her DM, she currently takes this TID.   PLAN -Labs reviewed; stable.  - f/u w/ Dr Lucia Gaskins in the near future for possible lumpectomy and sentinel lymph node biopsy -Advised her to call Dr Doyle Askew to continue weaning off of her steroids, would like to see her off of this w/in the next four weeks.  -Continue weekly Taxol with dose reduction to 70 mg/m, last dose next week. -Continue  Herceptin every 3 weeks, due today -Schedule MRI breast w/ wo contrast following finishing treatments in two weeks  -F/u in 3 weeks with labs.  -Will refill her rx'd Valium for her upcoming breast MRI.    Orders Placed This Encounter  Procedures  . MR BREAST BILATERAL W WO CONTRAST    Standing Status:   Future    Standing Expiration Date:   10/25/2017    Order Specific Question:   If indicated for the ordered procedure, I authorize the administration of contrast media per Radiology protocol    Answer:   Yes    Order Specific Question:   Reason for Exam (SYMPTOM  OR DIAGNOSIS REQUIRED)    Answer:   EVALUATE response to chemo    Order Specific Question:   What is the patient's sedation requirement?    Answer:   Anti-anxiety    Order Specific Question:   Does the patient have a pacemaker or implanted devices?    Answer:   No    Order Specific Question:   Preferred imaging location?    Answer:   [facility] (table limit-550lbs)    Order Specific Question:   Radiology Contrast Protocol - do NOT remove file path    Answer:   \\[email or web address]    All questions were answered. The patient knows to call the clinic with any problems, questions or concerns.  I spent 30 minutes counseling the patient face to face. The total time spent in the appointment was 40 minutes and more than 50% was on counseling.  This document serves as a record of services personally performed by Truitt Merle, MD. It was created on her behalf by Maryla Morrow, a trained medical scribe. The creation of this record is based on the scribe's personal observations and the provider's statements to them. This document has been checked and approved by the attending provider.   Truitt Merle, MD 08/24/2016

## 2016-08-24 ENCOUNTER — Encounter: Payer: Self-pay | Admitting: Hematology

## 2016-08-24 ENCOUNTER — Other Ambulatory Visit (HOSPITAL_BASED_OUTPATIENT_CLINIC_OR_DEPARTMENT_OTHER): Payer: 59

## 2016-08-24 ENCOUNTER — Ambulatory Visit (HOSPITAL_BASED_OUTPATIENT_CLINIC_OR_DEPARTMENT_OTHER): Payer: 59 | Admitting: Hematology

## 2016-08-24 ENCOUNTER — Ambulatory Visit: Payer: 59

## 2016-08-24 ENCOUNTER — Ambulatory Visit (HOSPITAL_BASED_OUTPATIENT_CLINIC_OR_DEPARTMENT_OTHER): Payer: 59

## 2016-08-24 VITALS — BP 118/69 | HR 87 | Temp 97.7°F | Resp 18 | Ht 64.5 in | Wt 209.1 lb

## 2016-08-24 DIAGNOSIS — K5190 Ulcerative colitis, unspecified, without complications: Secondary | ICD-10-CM | POA: Diagnosis not present

## 2016-08-24 DIAGNOSIS — C50412 Malignant neoplasm of upper-outer quadrant of left female breast: Secondary | ICD-10-CM | POA: Diagnosis not present

## 2016-08-24 DIAGNOSIS — Z5111 Encounter for antineoplastic chemotherapy: Secondary | ICD-10-CM

## 2016-08-24 DIAGNOSIS — Z5112 Encounter for antineoplastic immunotherapy: Secondary | ICD-10-CM

## 2016-08-24 DIAGNOSIS — Z171 Estrogen receptor negative status [ER-]: Secondary | ICD-10-CM

## 2016-08-24 DIAGNOSIS — E119 Type 2 diabetes mellitus without complications: Secondary | ICD-10-CM | POA: Diagnosis not present

## 2016-08-24 DIAGNOSIS — I10 Essential (primary) hypertension: Secondary | ICD-10-CM

## 2016-08-24 DIAGNOSIS — Z95828 Presence of other vascular implants and grafts: Secondary | ICD-10-CM

## 2016-08-24 DIAGNOSIS — K51311 Ulcerative (chronic) rectosigmoiditis with rectal bleeding: Secondary | ICD-10-CM

## 2016-08-24 LAB — CBC WITH DIFFERENTIAL/PLATELET
BASO%: 0.4 % (ref 0.0–2.0)
Basophils Absolute: 0 10*3/uL (ref 0.0–0.1)
EOS ABS: 0 10*3/uL (ref 0.0–0.5)
EOS%: 0.1 % (ref 0.0–7.0)
HCT: 33.3 % — ABNORMAL LOW (ref 34.8–46.6)
HGB: 11 g/dL — ABNORMAL LOW (ref 11.6–15.9)
LYMPH%: 50.1 % — AB (ref 14.0–49.7)
MCH: 33.4 pg (ref 25.1–34.0)
MCHC: 33.1 g/dL (ref 31.5–36.0)
MCV: 100.8 fL (ref 79.5–101.0)
MONO#: 0.5 10*3/uL (ref 0.1–0.9)
MONO%: 6.7 % (ref 0.0–14.0)
NEUT%: 42.7 % (ref 38.4–76.8)
NEUTROS ABS: 3.3 10*3/uL (ref 1.5–6.5)
Platelets: 268 10*3/uL (ref 145–400)
RBC: 3.31 10*6/uL — AB (ref 3.70–5.45)
RDW: 17.3 % — ABNORMAL HIGH (ref 11.2–14.5)
WBC: 7.7 10*3/uL (ref 3.9–10.3)
lymph#: 3.8 10*3/uL — ABNORMAL HIGH (ref 0.9–3.3)

## 2016-08-24 LAB — COMPREHENSIVE METABOLIC PANEL
ALT: 20 U/L (ref 0–55)
AST: 14 U/L (ref 5–34)
Albumin: 3.4 g/dL — ABNORMAL LOW (ref 3.5–5.0)
Alkaline Phosphatase: 75 U/L (ref 40–150)
Anion Gap: 12 mEq/L — ABNORMAL HIGH (ref 3–11)
BILIRUBIN TOTAL: 0.31 mg/dL (ref 0.20–1.20)
BUN: 23.1 mg/dL (ref 7.0–26.0)
CHLORIDE: 102 meq/L (ref 98–109)
CO2: 28 meq/L (ref 22–29)
Calcium: 9.3 mg/dL (ref 8.4–10.4)
Creatinine: 0.8 mg/dL (ref 0.6–1.1)
EGFR: 78 mL/min/{1.73_m2} — AB (ref >90)
GLUCOSE: 161 mg/dL — AB (ref 70–140)
Potassium: 3.6 mEq/L (ref 3.5–5.1)
SODIUM: 141 meq/L (ref 136–145)
TOTAL PROTEIN: 6.3 g/dL — AB (ref 6.4–8.3)

## 2016-08-24 MED ORDER — SODIUM CHLORIDE 0.9% FLUSH
10.0000 mL | INTRAVENOUS | Status: DC | PRN
  Administered 2016-08-24: 10 mL
  Filled 2016-08-24: qty 10

## 2016-08-24 MED ORDER — LORATADINE 10 MG PO TABS
10.0000 mg | ORAL_TABLET | Freq: Once | ORAL | Status: AC
  Administered 2016-08-24: 10 mg via ORAL
  Filled 2016-08-24: qty 1

## 2016-08-24 MED ORDER — DEXAMETHASONE SODIUM PHOSPHATE 10 MG/ML IJ SOLN
INTRAMUSCULAR | Status: AC
  Filled 2016-08-24: qty 1

## 2016-08-24 MED ORDER — ACETAMINOPHEN 325 MG PO TABS
ORAL_TABLET | ORAL | Status: AC
  Filled 2016-08-24: qty 2

## 2016-08-24 MED ORDER — HEPARIN SOD (PORK) LOCK FLUSH 100 UNIT/ML IV SOLN
500.0000 [IU] | Freq: Once | INTRAVENOUS | Status: AC | PRN
  Administered 2016-08-24: 500 [IU]
  Filled 2016-08-24: qty 5

## 2016-08-24 MED ORDER — TRASTUZUMAB CHEMO 150 MG IV SOLR
600.0000 mg | Freq: Once | INTRAVENOUS | Status: AC
  Administered 2016-08-24: 600 mg via INTRAVENOUS
  Filled 2016-08-24: qty 28.57

## 2016-08-24 MED ORDER — SODIUM CHLORIDE 0.9 % IV SOLN
Freq: Once | INTRAVENOUS | Status: AC
  Administered 2016-08-24: 15:00:00 via INTRAVENOUS

## 2016-08-24 MED ORDER — FAMOTIDINE IN NACL 20-0.9 MG/50ML-% IV SOLN
INTRAVENOUS | Status: AC
  Filled 2016-08-24: qty 50

## 2016-08-24 MED ORDER — FAMOTIDINE IN NACL 20-0.9 MG/50ML-% IV SOLN
20.0000 mg | Freq: Once | INTRAVENOUS | Status: AC
  Administered 2016-08-24: 20 mg via INTRAVENOUS

## 2016-08-24 MED ORDER — SODIUM CHLORIDE 0.9 % IV SOLN
70.0000 mg/m2 | Freq: Once | INTRAVENOUS | Status: AC
  Administered 2016-08-24: 150 mg via INTRAVENOUS
  Filled 2016-08-24: qty 25

## 2016-08-24 MED ORDER — DEXAMETHASONE SODIUM PHOSPHATE 10 MG/ML IJ SOLN
10.0000 mg | Freq: Once | INTRAMUSCULAR | Status: AC
  Administered 2016-08-24: 10 mg via INTRAVENOUS

## 2016-08-24 MED ORDER — SODIUM CHLORIDE 0.9% FLUSH
10.0000 mL | Freq: Once | INTRAVENOUS | Status: AC
  Administered 2016-08-24: 10 mL
  Filled 2016-08-24: qty 10

## 2016-08-24 MED ORDER — ACETAMINOPHEN 325 MG PO TABS
650.0000 mg | ORAL_TABLET | Freq: Once | ORAL | Status: AC
  Administered 2016-08-24: 650 mg via ORAL

## 2016-08-24 NOTE — Patient Instructions (Signed)

## 2016-08-24 NOTE — Patient Instructions (Signed)
[facility] Discharge Instructions for Patients Receiving Chemotherapy  Today you received the following chemotherapy agents Herceptin, Taxol   To help prevent nausea and vomiting after your treatment, we encourage you to take your nausea medication as directed.    If you develop nausea and vomiting that is not controlled by your nausea medication, call the clinic.   BELOW ARE SYMPTOMS THAT SHOULD BE REPORTED IMMEDIATELY:  *FEVER GREATER THAN 100.5 F  *CHILLS WITH OR WITHOUT FEVER  NAUSEA AND VOMITING THAT IS NOT CONTROLLED WITH YOUR NAUSEA MEDICATION  *UNUSUAL SHORTNESS OF BREATH  *UNUSUAL BRUISING OR BLEEDING  TENDERNESS IN MOUTH AND THROAT WITH OR WITHOUT PRESENCE OF ULCERS  *URINARY PROBLEMS  *BOWEL PROBLEMS  UNUSUAL RASH Items with * indicate a potential emergency and should be followed up as soon as possible.  Feel free to call the clinic you have any questions or concerns. The clinic phone number is [phone].  Please show the [place] at check-in to the Emergency Department and triage nurse.

## 2016-08-25 ENCOUNTER — Encounter: Payer: Self-pay | Admitting: Hematology

## 2016-08-30 ENCOUNTER — Other Ambulatory Visit: Payer: Self-pay | Admitting: Hematology

## 2016-08-31 ENCOUNTER — Ambulatory Visit: Payer: 59

## 2016-08-31 ENCOUNTER — Encounter: Payer: Self-pay | Admitting: *Deleted

## 2016-08-31 ENCOUNTER — Other Ambulatory Visit (HOSPITAL_BASED_OUTPATIENT_CLINIC_OR_DEPARTMENT_OTHER): Payer: 59

## 2016-08-31 ENCOUNTER — Ambulatory Visit (HOSPITAL_BASED_OUTPATIENT_CLINIC_OR_DEPARTMENT_OTHER): Payer: 59

## 2016-08-31 ENCOUNTER — Telehealth: Payer: Self-pay

## 2016-08-31 VITALS — BP 120/75 | HR 89 | Temp 98.3°F | Resp 18

## 2016-08-31 DIAGNOSIS — Z171 Estrogen receptor negative status [ER-]: Principal | ICD-10-CM

## 2016-08-31 DIAGNOSIS — C50412 Malignant neoplasm of upper-outer quadrant of left female breast: Secondary | ICD-10-CM

## 2016-08-31 DIAGNOSIS — Z5111 Encounter for antineoplastic chemotherapy: Secondary | ICD-10-CM | POA: Diagnosis not present

## 2016-08-31 DIAGNOSIS — Z95828 Presence of other vascular implants and grafts: Secondary | ICD-10-CM

## 2016-08-31 LAB — CBC WITH DIFFERENTIAL/PLATELET
BASO%: 0.7 % (ref 0.0–2.0)
Basophils Absolute: 0 10*3/uL (ref 0.0–0.1)
EOS ABS: 0 10*3/uL (ref 0.0–0.5)
EOS%: 0 % (ref 0.0–7.0)
HEMATOCRIT: 33.2 % — AB (ref 34.8–46.6)
HEMOGLOBIN: 11 g/dL — AB (ref 11.6–15.9)
LYMPH#: 1.4 10*3/uL (ref 0.9–3.3)
LYMPH%: 21.7 % (ref 14.0–49.7)
MCH: 33.3 pg (ref 25.1–34.0)
MCHC: 33.2 g/dL (ref 31.5–36.0)
MCV: 100.2 fL (ref 79.5–101.0)
MONO#: 0.3 10*3/uL (ref 0.1–0.9)
MONO%: 4.2 % (ref 0.0–14.0)
NEUT%: 73.4 % (ref 38.4–76.8)
NEUTROS ABS: 4.8 10*3/uL (ref 1.5–6.5)
PLATELETS: 281 10*3/uL (ref 145–400)
RBC: 3.32 10*6/uL — ABNORMAL LOW (ref 3.70–5.45)
RDW: 15.9 % — ABNORMAL HIGH (ref 11.2–14.5)
WBC: 6.5 10*3/uL (ref 3.9–10.3)

## 2016-08-31 LAB — TECHNOLOGIST REVIEW

## 2016-08-31 LAB — COMPREHENSIVE METABOLIC PANEL
ALBUMIN: 3.4 g/dL — AB (ref 3.5–5.0)
ALK PHOS: 85 U/L (ref 40–150)
ALT: 23 U/L (ref 0–55)
ANION GAP: 12 meq/L — AB (ref 3–11)
AST: 15 U/L (ref 5–34)
BILIRUBIN TOTAL: 0.25 mg/dL (ref 0.20–1.20)
BUN: 26.8 mg/dL — ABNORMAL HIGH (ref 7.0–26.0)
CALCIUM: 9.3 mg/dL (ref 8.4–10.4)
CO2: 27 meq/L (ref 22–29)
CREATININE: 0.9 mg/dL (ref 0.6–1.1)
Chloride: 101 mEq/L (ref 98–109)
EGFR: 75 mL/min/{1.73_m2} — AB (ref >90)
Glucose: 177 mg/dl — ABNORMAL HIGH (ref 70–140)
Potassium: 3.5 mEq/L (ref 3.5–5.1)
Sodium: 140 mEq/L (ref 136–145)
TOTAL PROTEIN: 6.5 g/dL (ref 6.4–8.3)

## 2016-08-31 MED ORDER — PACLITAXEL CHEMO INJECTION 300 MG/50ML
70.0000 mg/m2 | Freq: Once | INTRAVENOUS | Status: AC
  Administered 2016-08-31: 150 mg via INTRAVENOUS
  Filled 2016-08-31: qty 25

## 2016-08-31 MED ORDER — SODIUM CHLORIDE 0.9 % IV SOLN
Freq: Once | INTRAVENOUS | Status: AC
  Administered 2016-08-31: 15:00:00 via INTRAVENOUS

## 2016-08-31 MED ORDER — FAMOTIDINE IN NACL 20-0.9 MG/50ML-% IV SOLN
20.0000 mg | Freq: Once | INTRAVENOUS | Status: AC
  Administered 2016-08-31: 20 mg via INTRAVENOUS

## 2016-08-31 MED ORDER — HEPARIN SOD (PORK) LOCK FLUSH 100 UNIT/ML IV SOLN
500.0000 [IU] | Freq: Once | INTRAVENOUS | Status: AC | PRN
  Administered 2016-08-31: 500 [IU]
  Filled 2016-08-31: qty 5

## 2016-08-31 MED ORDER — LORATADINE 10 MG PO TABS
10.0000 mg | ORAL_TABLET | Freq: Once | ORAL | Status: AC
  Administered 2016-08-31: 10 mg via ORAL
  Filled 2016-08-31: qty 1

## 2016-08-31 MED ORDER — FAMOTIDINE IN NACL 20-0.9 MG/50ML-% IV SOLN
INTRAVENOUS | Status: AC
  Filled 2016-08-31: qty 50

## 2016-08-31 MED ORDER — SODIUM CHLORIDE 0.9% FLUSH
10.0000 mL | Freq: Once | INTRAVENOUS | Status: AC
  Administered 2016-08-31: 10 mL
  Filled 2016-08-31: qty 10

## 2016-08-31 MED ORDER — DEXAMETHASONE SODIUM PHOSPHATE 10 MG/ML IJ SOLN
INTRAMUSCULAR | Status: AC
  Filled 2016-08-31: qty 1

## 2016-08-31 MED ORDER — SODIUM CHLORIDE 0.9% FLUSH
10.0000 mL | INTRAVENOUS | Status: DC | PRN
  Administered 2016-08-31: 10 mL
  Filled 2016-08-31: qty 10

## 2016-08-31 MED ORDER — DEXAMETHASONE SODIUM PHOSPHATE 10 MG/ML IJ SOLN
10.0000 mg | Freq: Once | INTRAMUSCULAR | Status: AC
  Administered 2016-08-31: 10 mg via INTRAVENOUS

## 2016-08-31 NOTE — Patient Instructions (Signed)
Implanted Port Home Guide An implanted port is a type of central line that is placed under the skin. Central lines are used to provide IV access when treatment or nutrition needs to be given through a person's veins. Implanted ports are used for long-term IV access. An implanted port may be placed because:  You need IV medicine that would be irritating to the small veins in your hands or arms.  You need long-term IV medicines, such as antibiotics.  You need IV nutrition for a long period.  You need frequent blood draws for lab tests.  You need dialysis.  Implanted ports are usually placed in the chest area, but they can also be placed in the upper arm, the abdomen, or the leg. An implanted port has two main parts:  Reservoir. The reservoir is round and will appear as a small, raised area under your skin. The reservoir is the part where a needle is inserted to give medicines or draw blood.  Catheter. The catheter is a thin, flexible tube that extends from the reservoir. The catheter is placed into a large vein. Medicine that is inserted into the reservoir goes into the catheter and then into the vein.  How will I care for my incision site? Do not get the incision site wet. Bathe or shower as directed by your health care provider. How is my port accessed? Special steps must be taken to access the port:  Before the port is accessed, a numbing cream can be placed on the skin. This helps numb the skin over the port site.  Your health care provider uses a sterile technique to access the port. ? Your health care provider must put on a mask and sterile gloves. ? The skin over your port is cleaned carefully with an antiseptic and allowed to dry. ? The port is gently pinched between sterile gloves, and a needle is inserted into the port.  Only "non-coring" port needles should be used to access the port. Once the port is accessed, a blood return should be checked. This helps ensure that the port  is in the vein and is not clogged.  If your port needs to remain accessed for a constant infusion, a clear (transparent) bandage will be placed over the needle site. The bandage and needle will need to be changed every week, or as directed by your health care provider.  Keep the bandage covering the needle clean and dry. Do not get it wet. Follow your health care provider's instructions on how to take a shower or bath while the port is accessed.  If your port does not need to stay accessed, no bandage is needed over the port.  What is flushing? Flushing helps keep the port from getting clogged. Follow your health care provider's instructions on how and when to flush the port. Ports are usually flushed with saline solution or a medicine called heparin. The need for flushing will depend on how the port is used.  If the port is used for intermittent medicines or blood draws, the port will need to be flushed: ? After medicines have been given. ? After blood has been drawn. ? As part of routine maintenance.  If a constant infusion is running, the port may not need to be flushed.  How long will my port stay implanted? The port can stay in for as long as your health care provider thinks it is needed. When it is time for the port to come out, surgery will be   done to remove it. The procedure is similar to the one performed when the port was put in. When should I seek immediate medical care? When you have an implanted port, you should seek immediate medical care if:  You notice a bad smell coming from the incision site.  You have swelling, redness, or drainage at the incision site.  You have more swelling or pain at the port site or the surrounding area.  You have a fever that is not controlled with medicine.  This information is not intended to replace advice given to you by your health care provider. Make sure you discuss any questions you have with your health care provider. Document  Released: 01/15/2005 Document Revised: 06/23/2015 Document Reviewed: 09/22/2012 Elsevier Interactive Patient Education  2017 [employer]  

## 2016-08-31 NOTE — Patient Instructions (Signed)
[facility] Discharge Instructions for Patients Receiving Chemotherapy  Today you received the following chemotherapy agents taxol  To help prevent nausea and vomiting after your treatment, we encourage you to take your nausea medication as needed   If you develop nausea and vomiting that is not controlled by your nausea medication, call the clinic.   BELOW ARE SYMPTOMS THAT SHOULD BE REPORTED IMMEDIATELY:  *FEVER GREATER THAN 100.5 F  *CHILLS WITH OR WITHOUT FEVER  NAUSEA AND VOMITING THAT IS NOT CONTROLLED WITH YOUR NAUSEA MEDICATION  *UNUSUAL SHORTNESS OF BREATH  *UNUSUAL BRUISING OR BLEEDING  TENDERNESS IN MOUTH AND THROAT WITH OR WITHOUT PRESENCE OF ULCERS  *URINARY PROBLEMS  *BOWEL PROBLEMS  UNUSUAL RASH Items with * indicate a potential emergency and should be followed up as soon as possible.  Feel free to call the clinic you have any questions or concerns. The clinic phone number is [phone].  Please show the [place] at check-in to the Emergency Department and triage nurse.

## 2016-08-31 NOTE — Telephone Encounter (Signed)
Called and left message informing the patient of scheduled changes on 8/17

## 2016-09-04 ENCOUNTER — Telehealth: Payer: Self-pay | Admitting: *Deleted

## 2016-09-04 ENCOUNTER — Other Ambulatory Visit: Payer: Self-pay | Admitting: *Deleted

## 2016-09-04 DIAGNOSIS — C50412 Malignant neoplasm of upper-outer quadrant of left female breast: Secondary | ICD-10-CM

## 2016-09-04 MED ORDER — DIAZEPAM 5 MG PO TABS: 5.0000 mg | ORAL_TABLET | ORAL | 0 refills | Status: DC

## 2016-09-04 NOTE — Telephone Encounter (Signed)
Pt called requesting Valium 5 mg  # 2 to be called in to [facility] on [address] for pt to take prior to MRI procedure on 09/14/16.   Script called in to pharmacy per pt's request. Pt's    Phone     [phone].

## 2016-09-06 ENCOUNTER — Ambulatory Visit: Payer: 59

## 2016-09-06 ENCOUNTER — Ambulatory Visit: Payer: 59 | Admitting: Hematology

## 2016-09-06 ENCOUNTER — Other Ambulatory Visit: Payer: 59

## 2016-09-11 NOTE — Progress Notes (Signed)
Brandi Dickson  [phone] [phone]  Clinic Follow Up Note   Patient Care Team: Orpah Melter, MD as PCP - General (Family Medicine) [name], MD as Consulting Physician (General Surgery) Truitt Merle, MD as Consulting Physician (Hematology) Gery Pray, MD as Consulting Physician (Radiation Oncology) Ladene Artist, MD as Consulting Physician (Gastroenterology) 09/14/2016  CHIEF COMPLAINTS:  Follow up left breast cancer  Oncology History   Cancer Staging Breast cancer of upper-outer quadrant of left female breast [facility]) Staging form: Breast, AJCC 8th Edition - Clinical stage from 04/25/2016: Stage IIA (cT2, cN0, cM0, G3, ER: Negative, PR: Negative, HER2: Positive) - Signed by Truitt Merle, MD on 05/02/2016       Breast cancer of upper-outer quadrant of left female breast ([place])   04/24/2016 Mammogram    Category B breasts with a new irregular mass in the UOQ left breast middle depth. Ultrasound revealed a 3.9 cm mass in the 1:00 position. The left axilla was negative.       04/25/2016 Initial Biopsy    Biopsy of the left breast showed grade 3 invasive ductal carcinoma, DCIS, and lymphovascular invasion was present.      04/25/2016 Receptors her2    ER 0% negative, PR 0% negative, HER2 positive, Ki67 30%      05/02/2016 Initial Diagnosis    Breast cancer of upper-outer quadrant of left female breast ([place])     05/07/2016 Imaging    MRI of the bilateral breast 05/07/16 IMPRESSION: Lobulated enhancing mass (4.0 x 2.7 x 3.1 cm) in the upper-outer quadrant of the left breast corresponding with the recently diagnosed invasive mammary carcinoma. Linear enhancement extends 2.6 cm posterior to the mass worrisome for ductal carcinoma in-situ.      05/09/2016 Echocardiogram    Echo 05/09/16 -LF EF: 55-60%      05/11/2016 - 08/31/2016 Neo-Adjuvant Chemotherapy    Cycle 1 Docetaxel, Carboplatin, Herceptin and pejeta (TCHP), cycle 2 Docetaxel changed  to Taxol, carbo dose reduced to AUC 4.5 (from 5) due to severe diarrhea and cytopenia, Perjeta held after cycle 2.   Due to poor toleration, Brandi Dickson was held after cycle 4 and Taxol changed to weekly X4. Herceptin was continued during the above chemo.        05/13/2016 Imaging    CT Angio Chest PE IMPRESSION: No evidence of pulmonary emboli. Left breast mass consistent with the given clinical history. Stable left adrenal lesion likely representing a small adenoma.      05/18/2016 - 05/24/2016 Hospital Admission    Patient presented with nausea, vomiting, and diarrhea; admitted to hospital with Hyponatremia      06/07/2016 - 06/09/2016 Hospital Admission    Patient presents to hospital complaints of rectal bleeding and abdominal cramps when defacating      06/07/2016 Imaging    CT ABDOMEN PELVIS W CONTRAST  IMPRESSION: 1. Wall thickening of the descending and sigmoid colon consistent with an infectious or inflammatory colitis. 2. There is also a short segment of narrowing and possible wall thickening of the superior ascending colon which could reflect a constricting mass or, more likely, be an area of persistent colonic spasm. This could be further assessed with either colonoscopy or a barium enema after the current symptoms of colitis have resolved. 3. Small, subcentimeter, low-density liver lesions which may all be benign. However, 3 these are not evident on prior CT. Liver metastatic disease possible. These could be further assessed with liver MRI with and without contrast. 4. Adrenal lesions  which are stable, on the right and myelolipoma and on the left most likely an adenoma.      06/07/2016 Imaging    CT A/P IMPRESSION: 1. Wall thickening of the descending and sigmoid colon consistent with an infectious or inflammatory colitis. 2. There is also a short segment of narrowing and possible wall thickening of the superior ascending colon which could reflect a constricting mass  or, more likely, be an area of persistent colonic spasm. This could be further assessed with either colonoscopy or a barium enema after the current symptoms of colitis have resolved. 3. Small, subcentimeter, low-density liver lesions which may all be benign. However, 3 these are not evident on prior CT. Liver metastatic disease possible. These could be further assessed with liver MRI with and without contrast. 4. Adrenal lesions which are stable, on the right and myelolipoma and on the left most likely an adenoma.      06/07/2016 - 06/09/2016 Hospital Admission    Diarrhea and rectal Bleeding      07/19/2016 - 07/20/2016 Hospital Admission    Diarrhea and dehydration      08/09/2016 Mammogram    Mammogram 08/09/16 IMPRESSION:  The 3.1 cm x 2 cm x 1.5 cm irregular equal density mass in the left breast is consistent with the known carcinoma showing mammographic evidence of preoperative chemotherapy response.         08/09/2016 Imaging    [place] of left breast 08/09/16 IMPRESSION:  The 2.7 cm lobulated mass in the left breast is a known biopsy positive for malignancy. Surgical and oncology consult in progress.      08/16/2016 Imaging    MRI Abdomen W WO Contrast IMPRESSION: Tiny sub-cm hepatic cysts. No evidence of metastatic disease or other acute findings. Tiny benign left adrenal adenoma and right adrenal myelolipoma.      09/14/2016 -  Chemotherapy    Maintenance Herceptin every 3 weeks.       HISTORY OF PRESENTING ILLNESS (05/02/16):  Brandi Dickson 56 y.o. female is here because of a new diagnosis of left breast cancer. She is accompanied by her husband to our multidisciplinary breast clinic today.  The patient presented with a palpable left breast lump approximately 2 weeks ago. Bilateral diagnostic mammogram on 04/24/16 showed Category B breasts with a new irregular mass in the UOQ left breast middle depth. Ultrasound performed on 04/24/16 revealed a 3.9 cm mass in the 1:00  position. The left axilla was negative.  Biopsy of the left breast on 04/25/16 showed grade 3 invasive ductal carcinoma, DCIS, and lymphovascular invasion was present (ER 0% negative, PR 0% negative, HER2 positive, Ki67 30%).  She denies tenderness of the biopsied area. Reports minor dimpling. The patient is 8 weeks out from a left hip replacement. She had a right hip replacement 2010 years ago.  The patient and her husband present today in multidisciplinary breast clinic to discuss treatment options for the management of her disease.  The patient is taking Neurontin for neuropathy in her feet from diabetes. She has been diagnosed for diabetes for the past 4 years. She states she only has neuropathy at night and take 2 Neurontin at night and then has no symptoms.  GYN HISTORY  Menarchal: 9 LMP: Complete hysterectomy for uterine fibroids ~ 2009. She was bleeding too much with her last menstrual cycle lasting 6 weeks. No issues with menopause. Contraceptive: no  HRT: No GP: G1P0  CURRENT THERAPY: Maintenance Herceptin every 3 weeks, started on 09/15/2016. Pending  surgery.   INTERVAL HISTORY:  Brandi Dickson returns for follow up. Pt reports she has had breast pain localized under her left breast. Pt endorses wearing bras with underwire and voices if this could be the cause of her symptoms. Overall, things have otherwise been going well for her. Since finishing her chemo regimen she states that she is feeling back to normal and her bowel movements have improved. She notes that she has been retaining fluid, her ankles and feet have been swollen more recently. She has also gained 6lbs. She has been taking Lasix as prescribed. She is still currently weaning herself off of her Prednisone.   MEDICAL HISTORY:  Past Medical History:  Diagnosis Date  . Arthritis   . Asthma    triggered with [name] perfumes and cigarette smoke  . Cancer ([place])   . Colitis   . Diabetes mellitus without  complication ([place])   . Hypertension   . Neuropathy     SURGICAL HISTORY: Past Surgical History:  Procedure Laterality Date  . ABDOMINAL HYSTERECTOMY    . DILATION AND CURETTAGE OF UTERUS    . PORTACATH PLACEMENT Right 05/08/2016   Procedure: INSERTION PORT-A-CATH WITH [place];  Surgeon: [name], MD;  Location: [facility];  Service: General;  Laterality: Right;  . TONSILLECTOMY    . TOTAL HIP ARTHROPLASTY Right   . TOTAL HIP ARTHROPLASTY Left 03/06/2016   Procedure: LEFT TOTAL HIP ARTHROPLASTY ANTERIOR APPROACH;  Surgeon: [name], MD;  Location: [facility];  Service: Orthopedics;  Laterality: Left;    SOCIAL HISTORY: Social History   Social History  . Marital status: Married    Spouse name: N/A  . Number of children: N/A  . Years of education: N/A   Occupational History  . Not on file.   Social History Main Topics  . Smoking status: Former Smoker    Packs/day: 1.00    Years: 29.00    Types: Cigarettes    Quit date: 10/27/2002  . Smokeless tobacco: Never Used  . Alcohol use No  . Drug use: No  . Sexual activity: Yes    Birth control/ protection: Surgical   Other Topics Concern  . Not on file   Social History Narrative  . No narrative on file    FAMILY HISTORY: Family History  Problem Relation Age of Onset  . Colon cancer Father   . Stomach cancer Paternal Uncle   . Stomach cancer Paternal Uncle   . Melanoma Brother   . Thyroid cancer Brother   . Breast cancer Maternal Aunt   . Breast cancer Maternal Aunt   . Breast cancer Cousin     ALLERGIES:  is allergic to lisinopril; augmentin [amoxicillin-pot clavulanate]; and benadryl [diphenhydramine].  MEDICATIONS:  Current Outpatient Prescriptions  Medication Sig Dispense Refill  . albuterol (PROVENTIL HFA;VENTOLIN HFA) 108 (90 Base) MCG/ACT inhaler Inhale 1-2 puffs into the lungs every 6 (six) hours as needed for wheezing or shortness of breath.    . dexamethasone (DECADRON) 4 MG tablet Take 1  tablet (4 mg total) by mouth daily. Start the day before Taxotere. Then again the day after chemo for 3 days. 30 tablet 1  . diazepam (VALIUM) 5 MG tablet Take 1 tablet (5 mg total) by mouth as directed. Take 5 mg by mouth 30 minutes prior to MRI procedure.   May repeat x 1. DO NOT Drive after taking Valium as it may cause drowsiness. 2 tablet 0  . dicyclomine (BENTYL) 20 MG tablet  Take 1 tablet (20 mg total) by mouth 4 (four) times daily -  before meals and at bedtime. 30 tablet 0  . diphenoxylate-atropine (LOMOTIL) 2.5-0.025 MG tablet Take one to two tablets every six hours as needed for diarrhea 120 tablet 1  . ferrous sulfate (FERROUSUL) 325 (65 FE) MG tablet Take 1 tablet (325 mg total) by mouth 3 (three) times daily with meals.    . gabapentin (NEURONTIN) 100 MG capsule Take 200 mg by mouth at bedtime.     . hydrocortisone (ANUSOL-HC) 25 MG suppository Place 1 suppository (25 mg total) rectally 2 (two) times daily. 12 suppository 0  . lidocaine-prilocaine (EMLA) cream Apply 1 application to skin 1.5 to 2 hrs before use.  Cover to secure cream with plastic wrap. 30 g PRN  . loperamide (IMODIUM) 1 MG/5ML solution Take 3 mg by mouth as needed for diarrhea or loose stools.    [name] Kitchen LORazepam (ATIVAN) 0.5 MG tablet Take 1 tablet (0.5 mg total) by mouth once as needed for anxiety. 2 tablet 0  . magnesium oxide (MAG-OX) 400 MG tablet Take 400 mg by mouth 2 (two) times daily.    . Mesalamine (ASACOL HD) 800 MG TBEC Take 2 tablets (1,600 mg total) by mouth 3 (three) times daily. 90 tablet 5  . metFORMIN (GLUCOPHAGE-XR) 500 MG 24 hr tablet Take 1,000 mg by mouth at bedtime.    . metoprolol (LOPRESSOR) 50 MG tablet Take 50 mg by mouth at bedtime.     . niacin 250 MG tablet Take 250 mg by mouth at bedtime.     . ondansetron (ZOFRAN) 8 MG tablet Take 1 tablet (8 mg total) by mouth 2 (two) times daily as needed for refractory nausea / vomiting. Start on day 3 after chemo. 30 tablet 1  . potassium chloride SA  (K-DUR,KLOR-CON) 20 MEQ tablet Take 1 tablet (20 mEq total) by mouth 2 (two) times daily. (Patient taking differently: Take 20 mEq by mouth daily. ) 60 tablet 1  . predniSONE (DELTASONE) 20 MG tablet Take 10 mg by mouth daily.     . prochlorperazine (COMPAZINE) 10 MG tablet Take 1 tablet (10 mg total) by mouth every 6 (six) hours as needed (Nausea or vomiting). 30 tablet 1  . Specialty Vitamins Products (VITAMINS FOR THE HAIR) TABS Take 2 tablets by mouth 2 (two) times daily.    . traMADol (ULTRAM) 50 MG tablet Take 1-2 tablets (50-100 mg total) by mouth every 6 (six) hours as needed. 40 tablet 0  . triamterene-hydrochlorothiazide (MAXZIDE) 75-50 MG per tablet Take 0.5 tablets by mouth daily.      No current facility-administered medications for this visit.    Facility-Administered Medications Ordered in Other Visits  Medication Dose Route Frequency Provider Last Rate Last Dose  . heparin lock flush 100 unit/mL  500 Units Intracatheter Once PRN Truitt Merle, MD      . sodium chloride flush (NS) 0.9 % injection 10 mL  10 mL Intracatheter PRN Truitt Merle, MD      . sodium chloride flush (NS) 0.9 % injection 10 mL  10 mL Intracatheter PRN Truitt Merle, MD   10 mL at 08/24/16 1725    REVIEW OF SYSTEMS:   Constitutional: Denies fevers, chills or abnormal night sweats  Eyes: Denies blurriness of vision, double vision or watery eyes Ears, nose, mouth, throat, and face: Denies mucositis or sore throat Respiratory: Denies cough, dyspnea or wheezes Cardiovascular: Denies palpitation. (+) chest discomfort to underside of left breast, (+)  BLE swelling Gastrointestinal:  Denies heartburn. Denies nausea, vomiting.  Skin: Denies abnormal skin rashes Lymphatics: Denies new lymphadenopathy or easy bruising Neurological: (+) neuropathy in hands/feet, unchanged Behavioral/Psych: Mood is stable, no new changes  All other systems were reviewed with the patient and are negative.   PHYSICAL EXAMINATION:  ECOG  PERFORMANCE STATUS: 1 - Symptomatic but completely ambulatory  There were no vitals filed for this visit. There were no vitals filed for this visit. GENERAL:alert, no distress and comfortable SKIN: skin color, texture, turgor are normal, no rashes or significant lesions EYES: normal, conjunctiva are pink and non-injected, sclera clear OROPHARYNX:no exudate, no erythema and lips, buccal mucosa, and tongue normal  NECK: supple, thyroid normal size, non-tender, without nodularity LYMPH:  no palpable lymphadenopathy in the cervical, axillary or inguinal LUNGS: clear to auscultation and percussion with normal breathing effort HEART: regular rate & rhythm and no murmurs and no lower extremity edema ABDOMEN:abdomen soft, non-tender and normal bowel sounds Musculoskeletal:no cyanosis of digits and no clubbing  PSYCH: alert & oriented x 3 with fluent speech NEURO: no focal motor/sensory deficits BREAST: She has a 1x1cm mass in the UOQ of left breast, non-tender. No other palpable mass or tenderness. No skin change or nipple discharge. No palpable LAD.   LABORATORY DATA:  I have reviewed the data as listed CBC Latest Ref Rng & Units 09/14/2016 08/31/2016 08/24/2016  WBC 3.9 - 10.3 10e3/uL 7.1 6.5 7.7  Hemoglobin 11.6 - 15.9 g/dL 11.2(L) 11.0(L) 11.0(L)  Hematocrit 34.8 - 46.6 % 34.6(L) 33.2(L) 33.3(L)  Platelets 145 - 400 10e3/uL 227 281 268   CMP Latest Ref Rng & Units 09/14/2016 08/31/2016 08/24/2016  Glucose 70 - 140 mg/dl 136 177(H) 161(H)  BUN 7.0 - 26.0 mg/dL 24.8 26.8(H) 23.1  Creatinine 0.6 - 1.1 mg/dL 0.8 0.9 0.8  Sodium 136 - 145 mEq/L 140 140 141  Potassium 3.5 - 5.1 mEq/L 3.5 3.5 3.6  Chloride 101 - 111 mmol/L - - -  CO2 22 - 29 mEq/L 29 27 28   Calcium 8.4 - 10.4 mg/dL 9.3 9.3 9.3  Total Protein 6.4 - 8.3 g/dL 6.4 6.5 6.3(L)  Total Bilirubin 0.20 - 1.20 mg/dL 0.37 0.25 0.31  Alkaline Phos 40 - 150 U/L 81 85 75  AST 5 - 34 U/L 17 15 14   ALT 0 - 55 U/L 25 23 20    PATHOLOGY REPORT:    ADDITIONAL INFORMATION: 04/25/16 PROGNOSTIC INDICATORS Results: IMMUNOHISTOCHEMICAL AND MORPHOMETRIC ANALYSIS PERFORMED MANUALLY Estrogen Receptor: 0%, NEGATIVE Progesterone Receptor: 0%, NEGATIVE Proliferation Marker Ki67: 30% COMMENT: The negative hormone receptor study(ies) in this case has no internal positive control. REFERENCE RANGE ESTROGEN RECEPTOR NEGATIVE 0% POSITIVE =>1% REFERENCE RANGE PROGESTERONE RECEPTOR NEGATIVE 0% POSITIVE =>1% All controls stained appropriately Enid Cutter MD Pathologist, Electronic Signature ( Signed 05/01/2016) FLUORESCENCE IN-SITU HYBRIDIZATION Results: HER2 - **POSITIVE** RATIO OF HER2/CEP17 SIGNALS 2.25 AVERAGE HER2 COPY NUMBER PER CELL 8.45 1 of 3 FINAL for Kolasinski, Lodema P ([id number]) ADDITIONAL INFORMATION:(continued) Reference Range: NEGATIVE HER2/CEP17 Ratio <2.0 and average HER2 copy number <4.0 EQUIVOCAL HER2/CEP17 Ratio <2.0 and average HER2 copy number 4.0 and <6.0 POSITIVE HER2/CEP17 Ratio >=2.0 or <2.0 and average HER2 copy number >=6.0 Enid Cutter MD Pathologist, Electronic Signature ( Signed 04/30/2016) FINAL DIAGNOSIS Diagnosis Breast, left, needle core biopsy - INVASIVE DUCTAL CARCINOMA, SEE COMMENT. - DUCTAL CARCINOMA IN SITU. - LYMPHOVASCULAR INVASION PRESENT.  Microscopic Comment The carcinoma appears grade 3 with focal squamous differentiation. Prognostic markers will be ordered. Dr. Lyndon Code has reviewed the case. The  case was called to Dr. Isaiah Blakes on 04/26/2016. Vicente Males MD Pathologist, Electronic Signature (Case signed 04/26/2016)  RADIOGRAPHIC STUDIES: I have personally reviewed the radiological images as listed and agreed with the findings in the report.  MRI Abdomen W WO Contrast 08/16/16 IMPRESSION: Tiny sub-cm hepatic cysts. No evidence of metastatic disease or other acute findings. Tiny benign left adrenal adenoma and right adrenal myelolipoma.  Mammogram 08/09/16 IMPRESSION:  The 3.1 cm x  2 cm x 1.5 cm irregular equal density mass in the left breast is consistent with the known carcinoma showing mammographic evidence of preoperative chemotherapy response.   [place] of left breast 08/09/16 IMPRESSION:  The 2.7 cm lobulated mass in the left breast is a known biopsy positive for malignancy. Surgical and oncology consult in progress.   See onc history CT ABD/Pelvi 5/10/18s W CONTRAST 06/07/16 IMPRESSION: 1. Wall thickening of the descending and sigmoid colon consistent with an infectious or inflammatory colitis. 2. There is also a short segment of narrowing and possible wall thickening of the superior ascending colon which could reflect a constricting mass or, more likely, be an area of persistent colonic spasm. This could be further assessed with either colonoscopy or a barium enema after the current symptoms of colitis have resolved. 3. Small, subcentimeter, low-density liver lesions which may all be benign. However, 3 these are not evident on prior CT. Liver metastatic disease possible. These could be further assessed with liver MRI with and without contrast. 4. Adrenal lesions which are stable, on the right and myelolipoma and on the left most likely an adenoma.  CT Angio Chest PE W and/or wo Contrast 05/13/16 IMPRESSION: No evidence of pulmonary emboli.  Echo 05/09/16 Study Conclusions -LF EF: 55-60% - Left ventricle: The cavity size was normal. There was mild focal   basal hypertrophy of the septum. Indeterminant diastolic   function. Systolic function was normal. The estimated ejection   fraction was in the range of 55% to 60%. Wall motion was normal;   there were no regional wall motion abnormalities. GLS abnormal at   -13.3%, poor images however.  ASSESSMENT & PLAN: 56 y.o. post-menopausal Caucasian female with a self palpated left breast mass.  1. Breast cancer of upper-outer quadrant of left breast, invasive ductal carcinoma,  stage IIA (cT2N0M0) grade 3, ER-, PR-,  HER2 amplified -We previously reviewed the patient's imaging and pathology. -We previously reviewed her staging and biology of her breast cancer  -We previously discussed that surgical resection is the definitive treatment for breast cancer, she was seen by breast surgeon Dr. Lucia Gaskins today, lumpectomy versus mastectomy were discussed with patient. -We previously discussed HER2 positive breast cancers total approximately 15% of breast cancers and happens to be more aggressive than HER2 negative cancers, especially ER and PR negative disease, she has high likelihood of cancer recurrence after complete surgical resection.  -I recommend neoadjuvant chemotherapy TCHP (docetaxel, carboplatin, Herceptin and pejeta) every 3 weeks for 6 cycles, followed by maintenance Herceptin and pejeta to complete 1 year therapy -Her baseline echo was normal, she was seen by cardiologist Dr. Benjamine Mola -She unfortunately developed severe diarrhea and cytopenia after chemotherapy, and was hospitalized for several times.  -Chemotherapy was subsequently suggested and dose reduced, and stopped after 4 months treatment. - We previously reviewed her [place] and mammogram form 08/09/16 that shows partial response to chemotherapy, the mass had reduced from 4 cm to 2.7, cm no other new lesions.  -She will start maintenance Herceptin today --She is scheduled for breast MRI  to evaluate her response to neoadjuvant chemotherapy -We will send another message to Dr Pollie Friar office for f/u in the near future to discuss surgery.  -We'll continue Herceptin maintenance therapy, plans will be determined based on her surgical pathology     2. Genetics -Given her strong family history of breast cancer, we recommend her to see genetic counseling to ruled out inheritable breast cancer syndrome. She agreed. -Genetic counseling scheduled but she has not been seen, will refer her again   3. Type 2 Diabetes mellitus, HTN -Managed by her PCP. -The patient  has peripheral neuropathy in her feet from her diabetes. The patient is already on Neurontin with 200 mg at night. We previously discussed that chemotherapy may make her neuropathy worse. -Steroids will be given to reduce chemo side effects and I will reduce dexa to 45m daily to not affect her blood sugar much. -We'll monitor her blood glucose and blood pressure closely during her chemotherapy treatment.  4. Ulcerative colitis -We previously discussed that chemotherapy would cause diarrhea and the patient's colitis may exacerbate during chemo  -She is not taking steroids for this. She is on mesalamine, will follow up with Dr. SFuller Plan -She had severe diarrhea after first cycle chemotherapy, was treated for ulcerative colitis flare, she will follow-up with Dr. SFuller Plannext week. -I previously advised the patient to keep herself adequatly hydrated and to take Imodium PRN. - I again previously advised the patient to take Lomotil and Imodium frequently as needed. -Since change in chemo her diarrhea has resolved.     5. Arthritis  -s/p b/l hip replacement, last surgery in February 2018.   6. Peripheral Neuropathy, secondary to chemotherapy -primarily typing and her neuropathy is worse following bouts of typing. I encouraged her to use her left hand more often and begin wearing cotton gloves.  -Encouraged taking B12 multivitamin to aid with this as well.  -She is currently on Gabapentin for her baseline neuropathy associated w/ her DM, she currently takes this TID.  -She also began vitamin B12 in combination with her Gabapentin. She also reports that she has been taking all three tablets of Gabapentin at night to aid with sleep. This has been helping her with sleep, but her neuropathy has has still persisted throughout the day.  PLAN -Labs reviewed; stable, we'll proceed with Herceptin maintenance therapy and continue every 3 weeks -Breast MRI today -Will send Dr NLucia Gaskinsanother message and have her  f/u in the next few weeks  -Labs, flush, f/u, and herceptin in 3 and 6 weeks.  -Genetic referral again   No orders of the defined types were placed in this encounter.  All questions were answered. The patient knows to call the clinic with any problems, questions or concerns.  I spent 25 minutes counseling the patient face to face. The total time spent in the appointment was 30 minutes and more than 50% was on counseling.  This document serves as a record of services personally performed by YTruitt Merle MD. It was created on her behalf by WReola Mosher a trained medical scribe. The creation of this record is based on the scribe's personal observations and the provider's statements to them. This document has been checked and approved by the attending provider.   FTruitt Merle MD 09/15/2016

## 2016-09-14 ENCOUNTER — Ambulatory Visit (HOSPITAL_BASED_OUTPATIENT_CLINIC_OR_DEPARTMENT_OTHER): Payer: 59 | Admitting: Hematology

## 2016-09-14 ENCOUNTER — Telehealth: Payer: Self-pay | Admitting: Hematology

## 2016-09-14 ENCOUNTER — Ambulatory Visit (HOSPITAL_BASED_OUTPATIENT_CLINIC_OR_DEPARTMENT_OTHER): Payer: 59

## 2016-09-14 ENCOUNTER — Other Ambulatory Visit (HOSPITAL_BASED_OUTPATIENT_CLINIC_OR_DEPARTMENT_OTHER): Payer: 59

## 2016-09-14 ENCOUNTER — Ambulatory Visit: Payer: 59

## 2016-09-14 ENCOUNTER — Other Ambulatory Visit: Payer: 59

## 2016-09-14 ENCOUNTER — Ambulatory Visit
Admission: RE | Admit: 2016-09-14 | Discharge: 2016-09-14 | Disposition: A | Discharge status: Home / Self Care | Payer: 59 | Source: Ambulatory Visit | Attending: Hematology | Admitting: Hematology

## 2016-09-14 VITALS — BP 114/66 | HR 85 | Temp 98.7°F | Resp 18 | Ht 64.5 in | Wt 215.0 lb

## 2016-09-14 DIAGNOSIS — N63 Unspecified lump in unspecified breast: Secondary | ICD-10-CM | POA: Diagnosis not present

## 2016-09-14 DIAGNOSIS — Z171 Estrogen receptor negative status [ER-]: Principal | ICD-10-CM

## 2016-09-14 DIAGNOSIS — Z5112 Encounter for antineoplastic immunotherapy: Secondary | ICD-10-CM | POA: Diagnosis not present

## 2016-09-14 DIAGNOSIS — C50412 Malignant neoplasm of upper-outer quadrant of left female breast: Secondary | ICD-10-CM | POA: Diagnosis not present

## 2016-09-14 DIAGNOSIS — I10 Essential (primary) hypertension: Secondary | ICD-10-CM

## 2016-09-14 DIAGNOSIS — K51311 Ulcerative (chronic) rectosigmoiditis with rectal bleeding: Secondary | ICD-10-CM

## 2016-09-14 DIAGNOSIS — E119 Type 2 diabetes mellitus without complications: Secondary | ICD-10-CM | POA: Diagnosis not present

## 2016-09-14 LAB — CBC WITH DIFFERENTIAL/PLATELET
BASO%: 0.5 % (ref 0.0–2.0)
Basophils Absolute: 0 10*3/uL (ref 0.0–0.1)
EOS%: 0.4 % (ref 0.0–7.0)
Eosinophils Absolute: 0 10*3/uL (ref 0.0–0.5)
HCT: 34.6 % — ABNORMAL LOW (ref 34.8–46.6)
HEMOGLOBIN: 11.2 g/dL — AB (ref 11.6–15.9)
LYMPH%: 21.5 % (ref 14.0–49.7)
MCH: 32.5 pg (ref 25.1–34.0)
MCHC: 32.4 g/dL (ref 31.5–36.0)
MCV: 100.3 fL (ref 79.5–101.0)
MONO#: 0.6 10*3/uL (ref 0.1–0.9)
MONO%: 8.9 % (ref 0.0–14.0)
NEUT%: 68.7 % (ref 38.4–76.8)
NEUTROS ABS: 4.9 10*3/uL (ref 1.5–6.5)
Platelets: 227 10*3/uL (ref 145–400)
RBC: 3.45 10*6/uL — ABNORMAL LOW (ref 3.70–5.45)
RDW: 15.9 % — AB (ref 11.2–14.5)
WBC: 7.1 10*3/uL (ref 3.9–10.3)
lymph#: 1.5 10*3/uL (ref 0.9–3.3)

## 2016-09-14 LAB — COMPREHENSIVE METABOLIC PANEL
ALBUMIN: 3.3 g/dL — AB (ref 3.5–5.0)
ALK PHOS: 81 U/L (ref 40–150)
ALT: 25 U/L (ref 0–55)
AST: 17 U/L (ref 5–34)
Anion Gap: 9 mEq/L (ref 3–11)
BUN: 24.8 mg/dL (ref 7.0–26.0)
CO2: 29 mEq/L (ref 22–29)
Calcium: 9.3 mg/dL (ref 8.4–10.4)
Chloride: 103 mEq/L (ref 98–109)
Creatinine: 0.8 mg/dL (ref 0.6–1.1)
EGFR: 83 mL/min/{1.73_m2} — ABNORMAL LOW (ref >90)
GLUCOSE: 136 mg/dL (ref 70–140)
POTASSIUM: 3.5 meq/L (ref 3.5–5.1)
SODIUM: 140 meq/L (ref 136–145)
TOTAL PROTEIN: 6.4 g/dL (ref 6.4–8.3)
Total Bilirubin: 0.37 mg/dL (ref 0.20–1.20)

## 2016-09-14 MED ORDER — HEPARIN SOD (PORK) LOCK FLUSH 100 UNIT/ML IV SOLN
500.0000 [IU] | Freq: Once | INTRAVENOUS | Status: AC | PRN
  Administered 2016-09-14: 500 [IU]
  Filled 2016-09-14: qty 5

## 2016-09-14 MED ORDER — GADOBENATE DIMEGLUMINE 529 MG/ML IV SOLN
20.0000 mL | Freq: Once | INTRAVENOUS | Status: AC | PRN
  Administered 2016-09-14: 20 mL via INTRAVENOUS

## 2016-09-14 MED ORDER — TRASTUZUMAB CHEMO 150 MG IV SOLR
600.0000 mg | Freq: Once | INTRAVENOUS | Status: AC
  Administered 2016-09-14: 600 mg via INTRAVENOUS
  Filled 2016-09-14: qty 28.57

## 2016-09-14 MED ORDER — ACETAMINOPHEN 325 MG PO TABS
650.0000 mg | ORAL_TABLET | Freq: Once | ORAL | Status: AC
  Administered 2016-09-14: 650 mg via ORAL

## 2016-09-14 MED ORDER — SODIUM CHLORIDE 0.9 % IV SOLN
Freq: Once | INTRAVENOUS | Status: AC
  Administered 2016-09-14: 15:00:00 via INTRAVENOUS

## 2016-09-14 MED ORDER — SODIUM CHLORIDE 0.9% FLUSH
10.0000 mL | Freq: Once | INTRAVENOUS | Status: AC
  Administered 2016-09-14: 10 mL via INTRAVENOUS
  Filled 2016-09-14: qty 10

## 2016-09-14 MED ORDER — ACETAMINOPHEN 325 MG PO TABS
ORAL_TABLET | ORAL | Status: AC
  Filled 2016-09-14: qty 2

## 2016-09-14 MED ORDER — SODIUM CHLORIDE 0.9% FLUSH
10.0000 mL | INTRAVENOUS | Status: DC | PRN
  Administered 2016-09-14: 10 mL
  Filled 2016-09-14: qty 10

## 2016-09-14 NOTE — Telephone Encounter (Signed)
Scheduled appt per 8/17 los - patient is aware of appt time and dates

## 2016-09-14 NOTE — Patient Instructions (Signed)
Implanted Port Home Guide An implanted port is a type of central line that is placed under the skin. Central lines are used to provide IV access when treatment or nutrition needs to be given through a person's veins. Implanted ports are used for long-term IV access. An implanted port may be placed because:  You need IV medicine that would be irritating to the small veins in your hands or arms.  You need long-term IV medicines, such as antibiotics.  You need IV nutrition for a long period.  You need frequent blood draws for lab tests.  You need dialysis.  Implanted ports are usually placed in the chest area, but they can also be placed in the upper arm, the abdomen, or the leg. An implanted port has two main parts:  Reservoir. The reservoir is round and will appear as a small, raised area under your skin. The reservoir is the part where a needle is inserted to give medicines or draw blood.  Catheter. The catheter is a thin, flexible tube that extends from the reservoir. The catheter is placed into a large vein. Medicine that is inserted into the reservoir goes into the catheter and then into the vein.  How will I care for my incision site? Do not get the incision site wet. Bathe or shower as directed by your health care provider. How is my port accessed? Special steps must be taken to access the port:  Before the port is accessed, a numbing cream can be placed on the skin. This helps numb the skin over the port site.  Your health care provider uses a sterile technique to access the port. ? Your health care provider must put on a mask and sterile gloves. ? The skin over your port is cleaned carefully with an antiseptic and allowed to dry. ? The port is gently pinched between sterile gloves, and a needle is inserted into the port.  Only "non-coring" port needles should be used to access the port. Once the port is accessed, a blood return should be checked. This helps ensure that the port  is in the vein and is not clogged.  If your port needs to remain accessed for a constant infusion, a clear (transparent) bandage will be placed over the needle site. The bandage and needle will need to be changed every week, or as directed by your health care provider.  Keep the bandage covering the needle clean and dry. Do not get it wet. Follow your health care provider's instructions on how to take a shower or bath while the port is accessed.  If your port does not need to stay accessed, no bandage is needed over the port.  What is flushing? Flushing helps keep the port from getting clogged. Follow your health care provider's instructions on how and when to flush the port. Ports are usually flushed with saline solution or a medicine called heparin. The need for flushing will depend on how the port is used.  If the port is used for intermittent medicines or blood draws, the port will need to be flushed: ? After medicines have been given. ? After blood has been drawn. ? As part of routine maintenance.  If a constant infusion is running, the port may not need to be flushed.  How long will my port stay implanted? The port can stay in for as long as your health care provider thinks it is needed. When it is time for the port to come out, surgery will be   done to remove it. The procedure is similar to the one performed when the port was put in. When should I seek immediate medical care? When you have an implanted port, you should seek immediate medical care if:  You notice a bad smell coming from the incision site.  You have swelling, redness, or drainage at the incision site.  You have more swelling or pain at the port site or the surrounding area.  You have a fever that is not controlled with medicine.  This information is not intended to replace advice given to you by your health care provider. Make sure you discuss any questions you have with your health care provider. Document  Released: 01/15/2005 Document Revised: 06/23/2015 Document Reviewed: 09/22/2012 Elsevier Interactive Patient Education  2017 [employer]  

## 2016-09-14 NOTE — Patient Instructions (Signed)
[facility] Discharge Instructions for Patients Receiving Chemotherapy  Today you received the following chemotherapy agents: Herceptin   To help prevent nausea and vomiting after your treatment, we encourage you to take your nausea medication as directed.    If you develop nausea and vomiting that is not controlled by your nausea medication, call the clinic.   BELOW ARE SYMPTOMS THAT SHOULD BE REPORTED IMMEDIATELY:  *FEVER GREATER THAN 100.5 F  *CHILLS WITH OR WITHOUT FEVER  NAUSEA AND VOMITING THAT IS NOT CONTROLLED WITH YOUR NAUSEA MEDICATION  *UNUSUAL SHORTNESS OF BREATH  *UNUSUAL BRUISING OR BLEEDING  TENDERNESS IN MOUTH AND THROAT WITH OR WITHOUT PRESENCE OF ULCERS  *URINARY PROBLEMS  *BOWEL PROBLEMS  UNUSUAL RASH Items with * indicate a potential emergency and should be followed up as soon as possible.  Feel free to call the clinic you have any questions or concerns. The clinic phone number is [phone].  Please show the CHEMO ALERT CARD at check-in to the Emergency Department and triage nurse.   

## 2016-09-15 ENCOUNTER — Encounter: Payer: Self-pay | Admitting: Hematology

## 2016-09-18 ENCOUNTER — Other Ambulatory Visit: Payer: Self-pay | Admitting: Surgery

## 2016-09-18 DIAGNOSIS — C50912 Malignant neoplasm of unspecified site of left female breast: Secondary | ICD-10-CM

## 2016-09-18 DIAGNOSIS — K5190 Ulcerative colitis, unspecified, without complications: Secondary | ICD-10-CM | POA: Diagnosis not present

## 2016-09-20 ENCOUNTER — Telehealth: Payer: Self-pay | Admitting: Hematology

## 2016-09-20 NOTE — Telephone Encounter (Signed)
Spoke with patient regarding moving her appt to later in the day. Pushed her treatment to later in the day.

## 2016-10-02 ENCOUNTER — Encounter (HOSPITAL_BASED_OUTPATIENT_CLINIC_OR_DEPARTMENT_OTHER): Payer: Self-pay | Admitting: *Deleted

## 2016-10-05 ENCOUNTER — Ambulatory Visit (HOSPITAL_BASED_OUTPATIENT_CLINIC_OR_DEPARTMENT_OTHER): Payer: 59

## 2016-10-05 ENCOUNTER — Other Ambulatory Visit (HOSPITAL_BASED_OUTPATIENT_CLINIC_OR_DEPARTMENT_OTHER): Payer: 59

## 2016-10-05 ENCOUNTER — Ambulatory Visit: Payer: 59

## 2016-10-05 ENCOUNTER — Other Ambulatory Visit: Payer: 59

## 2016-10-05 VITALS — BP 122/79 | HR 80 | Temp 98.6°F | Resp 18

## 2016-10-05 DIAGNOSIS — C50412 Malignant neoplasm of upper-outer quadrant of left female breast: Secondary | ICD-10-CM

## 2016-10-05 DIAGNOSIS — Z5112 Encounter for antineoplastic immunotherapy: Secondary | ICD-10-CM | POA: Diagnosis not present

## 2016-10-05 DIAGNOSIS — Z171 Estrogen receptor negative status [ER-]: Principal | ICD-10-CM

## 2016-10-05 DIAGNOSIS — E1122 Type 2 diabetes mellitus with diabetic chronic kidney disease: Secondary | ICD-10-CM | POA: Diagnosis not present

## 2016-10-05 DIAGNOSIS — I10 Essential (primary) hypertension: Secondary | ICD-10-CM | POA: Diagnosis not present

## 2016-10-05 DIAGNOSIS — N183 Chronic kidney disease, stage 3 (moderate): Secondary | ICD-10-CM | POA: Diagnosis not present

## 2016-10-05 LAB — CBC WITH DIFFERENTIAL/PLATELET
BASO%: 0.5 % (ref 0.0–2.0)
Basophils Absolute: 0 10e3/uL (ref 0.0–0.1)
EOS%: 0.4 % (ref 0.0–7.0)
Eosinophils Absolute: 0 10e3/uL (ref 0.0–0.5)
HCT: 36.4 % (ref 34.8–46.6)
HGB: 11.8 g/dL (ref 11.6–15.9)
LYMPH%: 29.5 % (ref 14.0–49.7)
MCH: 31.3 pg (ref 25.1–34.0)
MCHC: 32.4 g/dL (ref 31.5–36.0)
MCV: 96.8 fL (ref 79.5–101.0)
MONO#: 0.4 10e3/uL (ref 0.1–0.9)
MONO%: 6.3 % (ref 0.0–14.0)
NEUT#: 3.9 10e3/uL (ref 1.5–6.5)
NEUT%: 63.3 % (ref 38.4–76.8)
Platelets: 246 10e3/uL (ref 145–400)
RBC: 3.76 10e6/uL (ref 3.70–5.45)
RDW: 15 % — ABNORMAL HIGH (ref 11.2–14.5)
WBC: 6.2 10e3/uL (ref 3.9–10.3)
lymph#: 1.8 10e3/uL (ref 0.9–3.3)

## 2016-10-05 LAB — COMPREHENSIVE METABOLIC PANEL
ALT: 17 U/L (ref 0–55)
AST: 14 U/L (ref 5–34)
Albumin: 3.3 g/dL — ABNORMAL LOW (ref 3.5–5.0)
Alkaline Phosphatase: 79 U/L (ref 40–150)
Anion Gap: 9 mEq/L (ref 3–11)
BUN: 19.7 mg/dL (ref 7.0–26.0)
CALCIUM: 9.7 mg/dL (ref 8.4–10.4)
CHLORIDE: 103 meq/L (ref 98–109)
CO2: 29 meq/L (ref 22–29)
CREATININE: 0.8 mg/dL (ref 0.6–1.1)
EGFR: 78 mL/min/{1.73_m2} — ABNORMAL LOW (ref >90)
GLUCOSE: 127 mg/dL (ref 70–140)
POTASSIUM: 3.5 meq/L (ref 3.5–5.1)
SODIUM: 141 meq/L (ref 136–145)
Total Bilirubin: 0.31 mg/dL (ref 0.20–1.20)
Total Protein: 6.7 g/dL (ref 6.4–8.3)

## 2016-10-05 MED ORDER — TRASTUZUMAB CHEMO 150 MG IV SOLR
600.0000 mg | Freq: Once | INTRAVENOUS | Status: AC
  Administered 2016-10-05: 600 mg via INTRAVENOUS
  Filled 2016-10-05: qty 28.57

## 2016-10-05 MED ORDER — SODIUM CHLORIDE 0.9 % IV SOLN
Freq: Once | INTRAVENOUS | Status: AC
  Administered 2016-10-05: 12:00:00 via INTRAVENOUS

## 2016-10-05 MED ORDER — ACETAMINOPHEN 325 MG PO TABS
650.0000 mg | ORAL_TABLET | Freq: Once | ORAL | Status: AC
  Administered 2016-10-05: 650 mg via ORAL

## 2016-10-05 MED ORDER — ACETAMINOPHEN 325 MG PO TABS
ORAL_TABLET | ORAL | Status: AC
Start: 2016-10-05 — End: 2016-10-05
  Filled 2016-10-05: qty 2

## 2016-10-05 MED ORDER — HEPARIN SOD (PORK) LOCK FLUSH 100 UNIT/ML IV SOLN
500.0000 [IU] | Freq: Once | INTRAVENOUS | Status: AC | PRN
  Administered 2016-10-05: 500 [IU]
  Filled 2016-10-05: qty 5

## 2016-10-05 MED ORDER — SODIUM CHLORIDE 0.9% FLUSH
10.0000 mL | INTRAVENOUS | Status: DC | PRN
  Administered 2016-10-05: 10 mL
  Filled 2016-10-05: qty 10

## 2016-10-05 NOTE — Addendum Note (Signed)
Addended by: Neysa Hotter on: 10/05/2016 03:46 PM   Modules accepted: Orders

## 2016-10-05 NOTE — Patient Instructions (Signed)
[facility] Discharge Instructions for Patients Receiving Chemotherapy  Today you received the following chemotherapy agents: Herceptin   To help prevent nausea and vomiting after your treatment, we encourage you to take your nausea medication as directed.    If you develop nausea and vomiting that is not controlled by your nausea medication, call the clinic.   BELOW ARE SYMPTOMS THAT SHOULD BE REPORTED IMMEDIATELY:  *FEVER GREATER THAN 100.5 F  *CHILLS WITH OR WITHOUT FEVER  NAUSEA AND VOMITING THAT IS NOT CONTROLLED WITH YOUR NAUSEA MEDICATION  *UNUSUAL SHORTNESS OF BREATH  *UNUSUAL BRUISING OR BLEEDING  TENDERNESS IN MOUTH AND THROAT WITH OR WITHOUT PRESENCE OF ULCERS  *URINARY PROBLEMS  *BOWEL PROBLEMS  UNUSUAL RASH Items with * indicate a potential emergency and should be followed up as soon as possible.  Feel free to call the clinic you have any questions or concerns. The clinic phone number is [phone].  Please show the CHEMO ALERT CARD at check-in to the Emergency Department and triage nurse.   

## 2016-10-08 DIAGNOSIS — C50412 Malignant neoplasm of upper-outer quadrant of left female breast: Secondary | ICD-10-CM | POA: Diagnosis not present

## 2016-10-08 DIAGNOSIS — C50211 Malignant neoplasm of upper-inner quadrant of right female breast: Secondary | ICD-10-CM | POA: Diagnosis not present

## 2016-10-08 NOTE — Progress Notes (Signed)
Ensure Pre-surgery drink given with instructions to complete by [place], hibiclens soap given with instructions. Pt verbalized understanding.

## 2016-10-08 NOTE — H&P (Signed)
Brandi Dickson  Location: [facility] Surgery Patient #: [id number] DOB: [birth year] Married / Language: English / Race: White Female  History of Present Illness   The patient is a 56 year old female who presents with a complaint of left breast cancer.   The PCP is Dr. [name]  The patient was referred by Dr. [name]  The pateint was seen at the [facility] - Oncology is Drs. Burr Medico and [place] She comes by herself.  She struggled with her chemotherapy early. She was hospitalized 3 times. The first time her ulcer colitis flared up. The second time she became dehydrated. The third hospitalization in May, she is also dehydrated, but always been 1 night in the hospital. [name] changed her chemotherapy, and she is well since that time. She is in a good mood today.   She had an MRI of her breast on 09/14/2016 - Left breast: There is an irregular enhancing mass in the upper-outer quadrant of the left breast, now measuring 2.0 x 1.6 x 2.5 cm, previously 4.0 x 2.7 x 3.1 cm. The linear enhancement posterior to the mass is no longer present. Signal void artifact from tissue marker clip is identified within the anterior aspect of the mass. Lymph nodes: No abnormal appearing lymph nodes. I gave her a copy of the MR.  I discussed the options for breast cancer treatment with the patient. The patient is at the [facility], which includes medical oncology and radiation oncology. I discussed the surgical options of lumpectomy vs. mastectomy. If mastectomy, there is the possibility of reconstruction. I discussed the options of lymph node biopsy. The treatment plan depends on the pathologic staging of the tumor and the patient's personal wishes. She appears to best suited for up front neoadjuvant chemotx. The risks of surgery include, but are not limited to, bleeding, infection, the need for further surgery, and nerve injury. The patient  has been given literature on the treatment of breast cancer.  Plan: 1) left breast lumpectomy and left axillary SLNBx, 2) She decided not to do genetics, 3) Rad tx to the left breast  History of breast cancer: She felt a mass in her left breast when she was on a trip to [place] with her husband. They were going there to visit friends. She was seeing Dr. [name], but he has retired, so she saw Dr. [name]. Her last mammogram was about 2 years ago. She updated her mammogram  Mammograms: at [facility] on on 04/24/2016 which showed a 3.9 cm lobulated mass in the left upper outer quadrant Biopsy: 04/25/2016 ([id number]) - IDC, grade 3, PR - 0%, ER - 0%, Ki67- 30%, and Her2Neu - positive Family history of breast or ovarian cancer: 2 aunts on her mother's side and one cousin On hormone therapy: None  Past Medical History: 1. Left breast cancer Neoadjuvant chemotx - May - August 2018 2. Claustrophobia I gave her Valium - 5 mg x 2 for the day of the MRI 3. Quit smoking 2004 4. UC diagnosed in July 2017 last colonoscopy in 2017 - UC well controlled on current meds and when she watches what she eats 5. DM - x 4 years Stable HbgA1c at 5.9 6. Left hip replaced by Dr. Alvan Dame She is still in recovery for this.  Social History: Her husband, [name]. 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 NexIUM (20MG  Capsule DR, Oral) Active. Niacin (500MG Tablet, Oral) Active. Super B Complex (Oral) Active. Ferrous Sulfate (324 (65 Fe)MG Tablet DR, Oral) Active. Metoprolol Tartrate (50MG Tablet, Oral) Active. MetFORMIN HCl (500MG Tablet, Oral) Active. LORazepam (0.5MG Tablet, Oral) Active. Diphenoxylate-Atropine (2.5-0.025MG Tablet, Oral) Active. Dexamethasone (4MG Tablet, Oral) Active. Dicyclomine HCl (20MG Tablet,  Oral) Active. Gabapentin (100MG Capsule, Oral) Active. Hydrocortisone Acetate (25MG Suppository, Rectal) Active. Klor-Con M20 [facility] Tablet ER, Oral) Active. Lidocaine-Prilocaine (2.5-2.5% Cream, External) Active. Mesalamine (800MG Tablet DR, Oral) Active. MetFORMIN HCl ER (500MG Tablet ER 24HR, Oral) Active. Ondansetron HCl (8MG Tablet, Oral) Active. PredniSONE (20MG Tablet, Oral) Active. Prochlorperazine Maleate (10MG Tablet, Oral) Active. Triamterene-HCTZ (75-50MG Tablet, Oral) Active. Medications Reconciled  Vitals Malachy Moan RMA; 09/18/2016 4:24 PM) 09/18/2016 4:23 PM Weight: 216.8 lb Height: 65in Body Surface Area: 2.05 m Body Mass Index: 36.08 kg/m  Temp.: 97.53F  Pulse: 87 (Regular)  BP: 130/80 (Sitting, Left Arm, Standard)   Physical Exam  General: WN moderately obese WF, alert and generally healthy appearing. Has lost her hair and has a wig on. Skin: Inspection and palpation of the skin unremarkable.  Eyes: Conjunctivae white, pupils equal. Face, ears, nose, mouth, and throat: Face - normal. Normal ears and nose. Lips and teeth normal.  Neck: Supple. No mass. Trachea midline. No thyroid mass.  Lymph Nodes: No supraclavicular or cervical adenopathy. No axillary adenopathy. Particular attention paid to the left axilla.  Lungs: Normal respiratory effort. Clear to auscultation and symmetric breath sounds. Cardiovascular: Regular rate and rythm. Normal auscultation of the heart. No murmur or rub.  Breasts: right - No mass or nodule. [place] of right breast. Left - Has a mass/plate like fullness in the 1 o'clock position, about 5 cm off the areaola - but I think that this is less distinct than what I could feel in [place] when I last saw her.  Abdomen: Soft. No mass. No tenderness. No hernia. Normal bowel sounds.   Musculoskeletal/extremities: Normal gait. Good strength and ROM in upper and lower extremities.      Assessment & Plan  1.  BREAST CANCER, STAGE 2, LEFT (C50.912)  Story: Mammograms: at [facility] on on 04/24/2016 which showed a 3.9 cm lobulated mass in the left upper outer quadrant  Biopsy: 04/25/2016 ([id number]) - IDC, grade 3, PR - 0%, ER - 0%, Ki67- 30%, and Her2Neu - positive  Oncology - [facility]   Plan:   1) Left breast lumpectomy (sed localization) and left axillary SLNBx,   2) she did not do genetics   3) radiation after surgery  2.  CHRONIC ULCERATIVE COLITIS WITHOUT COMPLICATION, UNSPECIFIED LOCATION (K51.90)  UC diagnosed in July 2017  Impression: Followed by Dr. Fuller Plan  3. Claustrophobia I gave her Valium - 5 mg x 2 for the day of the MRI 4. Quit smoking 2004  5. DM - x 4 years  Stable HbgA1c at 5.9 6. Left hip replaced by Dr. Alvan Dame - 03/06/2016  [name], MD, [facility] Surgery Pager: [phone] Office phone:  [phone]

## 2016-10-09 ENCOUNTER — Ambulatory Visit (HOSPITAL_BASED_OUTPATIENT_CLINIC_OR_DEPARTMENT_OTHER)
Admission: RE | Admit: 2016-10-09 | Discharge: 2016-10-09 | Disposition: A | Discharge status: Home / Self Care | Payer: 59 | Source: Ambulatory Visit | Attending: Surgery | Admitting: Surgery

## 2016-10-09 ENCOUNTER — Ambulatory Visit (HOSPITAL_BASED_OUTPATIENT_CLINIC_OR_DEPARTMENT_OTHER): Payer: 59 | Admitting: Anesthesiology

## 2016-10-09 ENCOUNTER — Encounter (HOSPITAL_BASED_OUTPATIENT_CLINIC_OR_DEPARTMENT_OTHER)
Admission: RE | Disposition: A | Discharge status: Home / Self Care | Payer: Self-pay | Source: Ambulatory Visit | Attending: Surgery

## 2016-10-09 ENCOUNTER — Encounter (HOSPITAL_BASED_OUTPATIENT_CLINIC_OR_DEPARTMENT_OTHER): Payer: Self-pay

## 2016-10-09 ENCOUNTER — Ambulatory Visit (HOSPITAL_COMMUNITY)
Admission: RE | Admit: 2016-10-09 | Discharge: 2016-10-09 | Disposition: A | Discharge status: Home / Self Care | Payer: 59 | Source: Ambulatory Visit | Attending: Surgery | Admitting: Surgery

## 2016-10-09 DIAGNOSIS — Z7952 Long term (current) use of systemic steroids: Secondary | ICD-10-CM | POA: Diagnosis not present

## 2016-10-09 DIAGNOSIS — C50412 Malignant neoplasm of upper-outer quadrant of left female breast: Secondary | ICD-10-CM | POA: Insufficient documentation

## 2016-10-09 DIAGNOSIS — Z6837 Body mass index (BMI) 37.0-37.9, adult: Secondary | ICD-10-CM | POA: Insufficient documentation

## 2016-10-09 DIAGNOSIS — Z79899 Other long term (current) drug therapy: Secondary | ICD-10-CM | POA: Diagnosis not present

## 2016-10-09 DIAGNOSIS — Z96642 Presence of left artificial hip joint: Secondary | ICD-10-CM | POA: Diagnosis not present

## 2016-10-09 DIAGNOSIS — F40240 Claustrophobia: Secondary | ICD-10-CM | POA: Insufficient documentation

## 2016-10-09 DIAGNOSIS — C773 Secondary and unspecified malignant neoplasm of axilla and upper limb lymph nodes: Secondary | ICD-10-CM | POA: Diagnosis not present

## 2016-10-09 DIAGNOSIS — Z171 Estrogen receptor negative status [ER-]: Secondary | ICD-10-CM | POA: Insufficient documentation

## 2016-10-09 DIAGNOSIS — Z87891 Personal history of nicotine dependence: Secondary | ICD-10-CM | POA: Diagnosis not present

## 2016-10-09 DIAGNOSIS — C50912 Malignant neoplasm of unspecified site of left female breast: Secondary | ICD-10-CM

## 2016-10-09 DIAGNOSIS — Z809 Family history of malignant neoplasm, unspecified: Secondary | ICD-10-CM | POA: Diagnosis not present

## 2016-10-09 DIAGNOSIS — N6012 Diffuse cystic mastopathy of left breast: Secondary | ICD-10-CM | POA: Diagnosis not present

## 2016-10-09 DIAGNOSIS — K5190 Ulcerative colitis, unspecified, without complications: Secondary | ICD-10-CM | POA: Insufficient documentation

## 2016-10-09 DIAGNOSIS — I10 Essential (primary) hypertension: Secondary | ICD-10-CM | POA: Insufficient documentation

## 2016-10-09 DIAGNOSIS — E119 Type 2 diabetes mellitus without complications: Secondary | ICD-10-CM | POA: Diagnosis not present

## 2016-10-09 DIAGNOSIS — Z7982 Long term (current) use of aspirin: Secondary | ICD-10-CM | POA: Insufficient documentation

## 2016-10-09 DIAGNOSIS — Z9221 Personal history of antineoplastic chemotherapy: Secondary | ICD-10-CM | POA: Diagnosis not present

## 2016-10-09 DIAGNOSIS — J45909 Unspecified asthma, uncomplicated: Secondary | ICD-10-CM | POA: Insufficient documentation

## 2016-10-09 DIAGNOSIS — G8918 Other acute postprocedural pain: Secondary | ICD-10-CM | POA: Diagnosis not present

## 2016-10-09 DIAGNOSIS — Z7984 Long term (current) use of oral hypoglycemic drugs: Secondary | ICD-10-CM | POA: Insufficient documentation

## 2016-10-09 HISTORY — PX: BREAST LUMPECTOMY WITH RADIOACTIVE SEED AND SENTINEL LYMPH NODE BIOPSY: SHX6550

## 2016-10-09 LAB — GLUCOSE, CAPILLARY
Glucose-Capillary: 73 mg/dL (ref 65–99)
Glucose-Capillary: 114 mg/dL — ABNORMAL HIGH (ref 65–99)

## 2016-10-09 SURGERY — BREAST LUMPECTOMY WITH RADIOACTIVE SEED AND SENTINEL LYMPH NODE BIOPSY
Anesthesia: General | Site: Breast | Laterality: Left

## 2016-10-09 MED ORDER — FENTANYL CITRATE (PF) 100 MCG/2ML IJ SOLN
50.0000 ug | INTRAMUSCULAR | Status: AC | PRN
  Administered 2016-10-09 (×2): 25 ug via INTRAVENOUS
  Administered 2016-10-09: 100 ug via INTRAVENOUS

## 2016-10-09 MED ORDER — PROMETHAZINE HCL 25 MG/ML IJ SOLN: 6.2500 mg | INTRAMUSCULAR | Status: DC | PRN

## 2016-10-09 MED ORDER — GABAPENTIN 300 MG PO CAPS
300.0000 mg | ORAL_CAPSULE | ORAL | Status: AC
  Administered 2016-10-09: 300 mg via ORAL

## 2016-10-09 MED ORDER — HYDROMORPHONE HCL 1 MG/ML IJ SOLN
INTRAMUSCULAR | Status: AC
  Filled 2016-10-09: qty 0.5

## 2016-10-09 MED ORDER — ACETAMINOPHEN 500 MG PO TABS
ORAL_TABLET | ORAL | Status: AC
  Filled 2016-10-09: qty 2

## 2016-10-09 MED ORDER — DEXAMETHASONE SODIUM PHOSPHATE 4 MG/ML IJ SOLN
INTRAMUSCULAR | Status: DC | PRN
  Administered 2016-10-09: 10 mg via INTRAVENOUS

## 2016-10-09 MED ORDER — CEFAZOLIN SODIUM-DEXTROSE 2-4 GM/100ML-% IV SOLN
INTRAVENOUS | Status: AC
  Filled 2016-10-09: qty 100

## 2016-10-09 MED ORDER — LACTATED RINGERS IV SOLN
INTRAVENOUS | Status: DC
  Administered 2016-10-09 (×2): via INTRAVENOUS

## 2016-10-09 MED ORDER — HYDROMORPHONE HCL 1 MG/ML IJ SOLN
0.2500 mg | INTRAMUSCULAR | Status: DC | PRN
  Administered 2016-10-09: 0.5 mg via INTRAVENOUS
  Administered 2016-10-09: 0.25 mg via INTRAVENOUS

## 2016-10-09 MED ORDER — MIDAZOLAM HCL 2 MG/2ML IJ SOLN
1.0000 mg | INTRAMUSCULAR | Status: DC | PRN
  Administered 2016-10-09: 2 mg via INTRAVENOUS

## 2016-10-09 MED ORDER — TECHNETIUM TC 99M SULFUR COLLOID FILTERED
1.0000 | Freq: Once | INTRAVENOUS | Status: AC | PRN
  Administered 2016-10-09: 1 via INTRADERMAL

## 2016-10-09 MED ORDER — LIDOCAINE HCL (CARDIAC) 20 MG/ML IV SOLN
INTRAVENOUS | Status: DC | PRN
  Administered 2016-10-09: 60 mg via INTRAVENOUS

## 2016-10-09 MED ORDER — CHLORHEXIDINE GLUCONATE CLOTH 2 % EX PADS: 6.0000 | MEDICATED_PAD | Freq: Once | CUTANEOUS | Status: DC

## 2016-10-09 MED ORDER — PROPOFOL 10 MG/ML IV BOLUS
INTRAVENOUS | Status: AC
Start: 2016-10-09 — End: 2016-10-09
  Filled 2016-10-09: qty 20

## 2016-10-09 MED ORDER — FENTANYL CITRATE (PF) 100 MCG/2ML IJ SOLN
INTRAMUSCULAR | Status: AC
  Filled 2016-10-09: qty 2

## 2016-10-09 MED ORDER — OXYCODONE HCL 5 MG PO TABS: 5.0000 mg | ORAL_TABLET | Freq: Once | ORAL | Status: DC | PRN

## 2016-10-09 MED ORDER — CEFAZOLIN SODIUM-DEXTROSE 2-4 GM/100ML-% IV SOLN
2.0000 g | INTRAVENOUS | Status: AC
  Administered 2016-10-09: 2 g via INTRAVENOUS

## 2016-10-09 MED ORDER — GABAPENTIN 300 MG PO CAPS
ORAL_CAPSULE | ORAL | Status: AC
  Filled 2016-10-09: qty 1

## 2016-10-09 MED ORDER — BUPIVACAINE-EPINEPHRINE (PF) 0.25% -1:200000 IJ SOLN
INTRAMUSCULAR | Status: DC | PRN
  Administered 2016-10-09: 20 mL

## 2016-10-09 MED ORDER — 0.9 % SODIUM CHLORIDE (POUR BTL) OPTIME
TOPICAL | Status: DC | PRN
  Administered 2016-10-09: 500 mL

## 2016-10-09 MED ORDER — SCOPOLAMINE 1 MG/3DAYS TD PT72: 1.0000 | MEDICATED_PATCH | Freq: Once | TRANSDERMAL | Status: DC | PRN

## 2016-10-09 MED ORDER — OXYCODONE HCL 5 MG/5ML PO SOLN: 5.0000 mg | Freq: Once | ORAL | Status: DC | PRN

## 2016-10-09 MED ORDER — BUPIVACAINE-EPINEPHRINE (PF) 0.5% -1:200000 IJ SOLN
INTRAMUSCULAR | Status: DC | PRN
  Administered 2016-10-09: 30 mL

## 2016-10-09 MED ORDER — METHYLENE BLUE 0.5 % INJ SOLN
INTRAVENOUS | Status: DC | PRN
  Administered 2016-10-09: 1 mL via INTRADERMAL

## 2016-10-09 MED ORDER — MEPERIDINE HCL 25 MG/ML IJ SOLN: 6.2500 mg | INTRAMUSCULAR | Status: DC | PRN

## 2016-10-09 MED ORDER — MIDAZOLAM HCL 2 MG/2ML IJ SOLN
INTRAMUSCULAR | Status: AC
  Filled 2016-10-09: qty 2

## 2016-10-09 MED ORDER — ACETAMINOPHEN 500 MG PO TABS
1000.0000 mg | ORAL_TABLET | ORAL | Status: AC
  Administered 2016-10-09: 1000 mg via ORAL

## 2016-10-09 MED ORDER — PROPOFOL 10 MG/ML IV BOLUS
INTRAVENOUS | Status: DC | PRN
  Administered 2016-10-09: 200 mg via INTRAVENOUS

## 2016-10-09 MED ORDER — ONDANSETRON HCL 4 MG/2ML IJ SOLN
INTRAMUSCULAR | Status: DC | PRN
  Administered 2016-10-09: 4 mg via INTRAVENOUS

## 2016-10-09 MED ORDER — HYDROCODONE-ACETAMINOPHEN 5-325 MG PO TABS: 1.0000 | ORAL_TABLET | Freq: Four times a day (QID) | ORAL | 0 refills | Status: DC | PRN

## 2016-10-09 SURGICAL SUPPLY — 54 items
BENZOIN TINCTURE PRP APPL 2/3 (GAUZE/BANDAGES/DRESSINGS) IMPLANT
BINDER BREAST LRG (GAUZE/BANDAGES/DRESSINGS) IMPLANT
BINDER BREAST MEDIUM (GAUZE/BANDAGES/DRESSINGS) IMPLANT
BINDER BREAST XLRG (GAUZE/BANDAGES/DRESSINGS) IMPLANT
BINDER BREAST XXLRG (GAUZE/BANDAGES/DRESSINGS) ×2 IMPLANT
BIOPATCH RED 1 DISK 7.0 (GAUZE/BANDAGES/DRESSINGS) ×2 IMPLANT
BLADE SURG 15 STRL LF DISP TIS (BLADE) ×1 IMPLANT
BLADE SURG 15 STRL SS (BLADE) ×1
CANISTER SUC SOCK COL 7IN (MISCELLANEOUS) IMPLANT
CANISTER SUCT 1200ML W/VALVE (MISCELLANEOUS) ×2 IMPLANT
CHLORAPREP W/TINT 26ML (MISCELLANEOUS) ×2 IMPLANT
CLIP VESOCCLUDE SM WIDE 6/CT (CLIP) ×2 IMPLANT
COVER BACK TABLE 60X90IN (DRAPES) ×2 IMPLANT
COVER MAYO STAND STRL (DRAPES) ×2 IMPLANT
COVER PROBE W GEL 5X96 (DRAPES) IMPLANT
DECANTER SPIKE VIAL GLASS SM (MISCELLANEOUS) IMPLANT
DERMABOND ADVANCED (GAUZE/BANDAGES/DRESSINGS) ×1
DERMABOND ADVANCED .7 DNX12 (GAUZE/BANDAGES/DRESSINGS) ×1 IMPLANT
DEVICE DUBIN W/COMP PLATE 8390 (MISCELLANEOUS) ×2 IMPLANT
DRAIN CHANNEL 19F RND (DRAIN) ×2 IMPLANT
DRAPE LAPAROSCOPIC ABDOMINAL (DRAPES) ×2 IMPLANT
DRAPE UTILITY XL STRL (DRAPES) ×2 IMPLANT
DRSG PAD ABDOMINAL 8X10 ST (GAUZE/BANDAGES/DRESSINGS) ×2 IMPLANT
ELECT COATED BLADE 2.86 ST (ELECTRODE) ×2 IMPLANT
ELECT REM PT RETURN 9FT ADLT (ELECTROSURGICAL) ×2
ELECTRODE REM PT RTRN 9FT ADLT (ELECTROSURGICAL) ×1 IMPLANT
EVACUATOR SILICONE 100CC (DRAIN) ×2 IMPLANT
GAUZE SPONGE 4X4 12PLY STRL (GAUZE/BANDAGES/DRESSINGS) ×2 IMPLANT
GLOVE BIO SURGEON STRL SZ7 (GLOVE) ×4 IMPLANT
GLOVE SURG SIGNA 7.5 PF LTX (GLOVE) ×4 IMPLANT
GLOVE SURG SS PI 7.0 STRL IVOR (GLOVE) ×2 IMPLANT
GOWN STRL REUS W/ TWL LRG LVL3 (GOWN DISPOSABLE) ×1 IMPLANT
GOWN STRL REUS W/ TWL XL LVL3 (GOWN DISPOSABLE) ×1 IMPLANT
GOWN STRL REUS W/TWL LRG LVL3 (GOWN DISPOSABLE) ×1
GOWN STRL REUS W/TWL XL LVL3 (GOWN DISPOSABLE) ×1
KIT MARKER MARGIN INK (KITS) ×2 IMPLANT
NDL SAFETY ECLIPSE 18X1.5 (NEEDLE) ×1 IMPLANT
NEEDLE HYPO 18GX1.5 SHARP (NEEDLE) ×1
NEEDLE HYPO 25X1 1.5 SAFETY (NEEDLE) ×4 IMPLANT
NS IRRIG 1000ML POUR BTL (IV SOLUTION) ×2 IMPLANT
PACK BASIN DAY SURGERY FS (CUSTOM PROCEDURE TRAY) ×2 IMPLANT
PENCIL BUTTON HOLSTER BLD 10FT (ELECTRODE) ×2 IMPLANT
SHEET MEDIUM DRAPE 40X70 STRL (DRAPES) ×2 IMPLANT
SLEEVE SCD COMPRESS KNEE MED (MISCELLANEOUS) ×2 IMPLANT
SPONGE LAP 18X18 X RAY DECT (DISPOSABLE) ×2 IMPLANT
STRIP CLOSURE SKIN 1/2X4 (GAUZE/BANDAGES/DRESSINGS) IMPLANT
SUT ETHILON 2 0 FS 18 (SUTURE) ×2 IMPLANT
SUT MNCRL AB 4-0 PS2 18 (SUTURE) ×4 IMPLANT
SUT VICRYL 3-0 CR8 SH (SUTURE) ×2 IMPLANT
SYR CONTROL 10ML LL (SYRINGE) ×2 IMPLANT
TOWEL OR 17X24 6PK STRL BLUE (TOWEL DISPOSABLE) ×2 IMPLANT
TOWEL OR NON WOVEN STRL DISP B (DISPOSABLE) ×2 IMPLANT
TUBE CONNECTING 20X1/4 (TUBING) ×2 IMPLANT
YANKAUER SUCT BULB TIP NO VENT (SUCTIONS) ×2 IMPLANT

## 2016-10-09 NOTE — Discharge Instructions (Signed)
[facility] [place] - DISCHARGE INSTRUCTIONS TO PATIENT  Activity:  Driving - May drive in 1 to 3 days, if doing well.   Lifting - No lifting more than 15 pounds for 7 days  Wound Care:   Leave dressing on for 2 days, then may remove and shower.        Empty drain twice a day, record the amount of drainage, and bring that record with you to our office.  Diet:  As tolerated.  Follow up appointment:  Call Dr. Pollie Friar office [facility]) at [phone] for an appointment in 1 week.  Medications and dosages:  Resume your home medications.  You have a prescription for:  Vicodin.  Call Dr. Lucia Gaskins or his office  [phone]) if you have:  Temperature greater than 100.4,  Persistent nausea and vomiting,  Severe uncontrolled pain,  Redness, tenderness, or signs of infection (pain, swelling, redness, odor or green/yellow discharge around the site),  Difficulty breathing, headache or visual disturbances,  Any other questions or concerns you may have after discharge.  In an emergency, call 911 or go to an Emergency Department at a nearby hospital.     Post Anesthesia Home Care Instructions  Activity: Get plenty of rest for the remainder of the day. A responsible individual must stay with you for 24 hours following the procedure.  For the next 24 hours, DO NOT: -Drive a car -Paediatric nurse -Drink alcoholic beverages -Take any medication unless instructed by your physician -Make any legal decisions or sign important papers.  Meals: Start with liquid foods such as gelatin or soup. Progress to regular foods as tolerated. Avoid greasy, spicy, heavy foods. If nausea and/or vomiting occur, drink only clear liquids until the nausea and/or vomiting subsides. Call your physician if vomiting continues.  Special Instructions/Symptoms: Your throat may feel dry or sore from the anesthesia or the breathing tube placed in your throat during surgery. If this causes  discomfort, gargle with warm salt water. The discomfort should disappear within 24 hours.  If you had a scopolamine patch placed behind your ear for the management of post- operative nausea and/or vomiting:  1. The medication in the patch is effective for 72 hours, after which it should be removed.  Wrap patch in a tissue and discard in the trash. Wash hands thoroughly with soap and water. 2. You may remove the patch earlier than 72 hours if you experience unpleasant side effects which may include dry mouth, dizziness or visual disturbances. 3. Avoid touching the patch. Wash your hands with soap and water after contact with the patch.     About my Jackson-Pratt Bulb Drain  What is a Jackson-Pratt bulb? A Jackson-Pratt is a soft, round device used to collect drainage. It is connected to a long, thin drainage catheter, which is held in place by one or two small stiches near your surgical incision site. When the bulb is squeezed, it forms a vacuum, forcing the drainage to empty into the bulb.  Emptying the Jackson-Pratt bulb- To empty the bulb: 1. Release the plug on the top of the bulb. 2. Pour the bulb's contents into a measuring container which your nurse will provide. 3. Record the time emptied and amount of drainage. Empty the drain(s) as often as your     doctor or nurse recommends.  Date                  Time  Amount (Drain 1)                 Amount (Drain  2)  _____________________________________________________________________  _____________________________________________________________________  _____________________________________________________________________  _____________________________________________________________________  _____________________________________________________________________  _____________________________________________________________________  _____________________________________________________________________  _____________________________________________________________________  Squeezing the Jackson-Pratt Bulb- To squeeze the bulb: 1. Make sure the plug at the top of the bulb is open. 2. Squeeze the bulb tightly in your fist. You will hear air squeezing from the bulb. 3. Replace the plug while the bulb is squeezed. 4. Use a safety pin to attach the bulb to your clothing. This will keep the catheter from     pulling at the bulb insertion site.  When to call your doctor- Call your doctor if:  Drain site becomes red, swollen or hot.  You have a fever greater than 101 degrees F.  There is oozing at the drain site.  Drain falls out (apply a guaze bandage over the drain hole and secure it with tape).  Drainage increases daily not related to activity patterns. (You will usually have more drainage when you are active than when you are resting.)  Drainage has a bad odor.  Regional Anesthesia Blocks  1. Numbness or the inability to move the "blocked" extremity may last from 3-48 hours after placement. The length of time depends on the medication injected and your individual response to the medication. If the numbness is not going away after 48 hours, call your surgeon.  2. The extremity that is blocked will need to be protected until the numbness is gone and the  Strength has returned. Because you cannot feel it, you will need to take extra care to avoid injury. Because it may be weak, you may  have difficulty moving it or using it. You may not know what position it is in without looking at it while the block is in effect.  3. For blocks in the legs and feet, returning to weight bearing and walking needs to be done carefully. You will need to wait until the numbness is entirely gone and the strength has returned. You should be able to move your leg and foot normally before you try and bear weight or walk. You will need someone to be with you when you first try to ensure you do not fall and possibly risk injury.  4. Bruising and tenderness at the needle site are common side effects and will resolve in a few days.  5. Persistent numbness or new problems with movement should be communicated to the surgeon or the [place] [phone] [place] [phone]).

## 2016-10-09 NOTE — Progress Notes (Signed)
Assisted Dr. Germeroth with left, ultrasound guided, pectoralis block. Side rails up, monitors on throughout procedure. See vital signs in flow sheet. Tolerated Procedure well. 

## 2016-10-09 NOTE — Anesthesia Procedure Notes (Signed)
Procedure Name: LMA Insertion Date/Time: 10/09/2016 3:01 PM Performed by: Maryella Shivers Pre-anesthesia Checklist: Patient identified, Emergency Drugs available, Suction available and Patient being monitored Patient Re-evaluated:Patient Re-evaluated prior to induction Oxygen Delivery Method: Circle system utilized Preoxygenation: Pre-oxygenation with 100% oxygen Induction Type: IV induction Ventilation: Mask ventilation without difficulty LMA: LMA inserted LMA Size: 4.0 Number of attempts: 1 Airway Equipment and Method: Bite block Placement Confirmation: positive ETCO2 Tube secured with: Tape Dental Injury: Teeth and Oropharynx as per pre-operative assessment

## 2016-10-09 NOTE — Anesthesia Preprocedure Evaluation (Signed)
Anesthesia Evaluation  Patient identified by MRN, date of birth, ID band Patient awake    Reviewed: Allergy & Precautions, H&P , NPO status , Patient's Chart, lab work & pertinent test results, reviewed documented beta blocker date and time   Airway Mallampati: II  TM Distance: >3 FB Neck ROM: Full    Dental no notable dental hx. (+) Teeth Intact, Dental Advisory Given   Pulmonary asthma , former smoker,    Pulmonary exam normal breath sounds clear to auscultation       Cardiovascular Exercise Tolerance: Good hypertension, Pt. on medications and Pt. on home beta blockers  Rhythm:Regular Rate:Normal     Neuro/Psych negative neurological ROS  negative psych ROS   GI/Hepatic negative GI ROS, Neg liver ROS,   Endo/Other  diabetes, Type 2, Oral Hypoglycemic AgentsMorbid obesity  Renal/GU negative Renal ROS  negative genitourinary   Musculoskeletal  (+) Arthritis , Osteoarthritis,    Abdominal (+) + obese,   Peds  Hematology negative hematology ROS (+)   Anesthesia Other Findings   Reproductive/Obstetrics negative OB ROS                             Anesthesia Physical  Anesthesia Plan  ASA: III  Anesthesia Plan: General   Post-op Pain Management: GA combined w/ Regional for post-op pain   Induction: Intravenous  PONV Risk Score and Plan: 3 and Ondansetron, Dexamethasone and Midazolam  Airway Management Planned: LMA  Additional Equipment:   Intra-op Plan:   Post-operative Plan: Extubation in OR  Informed Consent: I have reviewed the patients History and Physical, chart, labs and discussed the procedure including the risks, benefits and alternatives for the proposed anesthesia with the patient or authorized representative who has indicated his/her understanding and acceptance.   Dental advisory given  Plan Discussed with: CRNA  Anesthesia Plan Comments:          Anesthesia Quick Evaluation

## 2016-10-09 NOTE — Transfer of Care (Signed)
Immediate Anesthesia Transfer of Care Note  Patient: Brandi Dickson  Procedure(s) Performed: Procedure(s): LEFT BREAST LUMPECTOMY WITH RADIOACTIVE SEED AND L4EFT AXILLARY SENTINEL LYMPH NODE BIOPSY (Left)  Patient Location: PACU  Anesthesia Type:GA combined with regional for post-op pain  Level of Consciousness: awake and patient cooperative  Airway & Oxygen Therapy: Patient Spontanous Breathing and Patient connected to face mask oxygen  Post-op Assessment: Report given to RN and Post -op Vital signs reviewed and stable  Post vital signs: Reviewed and stable  Last Vitals:  Vitals:   10/09/16 1640 10/09/16 1641  BP: 125/83   Pulse: 65 73  Resp: 12 14  Temp:    SpO2: 100% 100%    Last Pain:  Vitals:   10/09/16 1427  PainSc: 0-No pain         Complications: No apparent anesthesia complications

## 2016-10-09 NOTE — Op Note (Signed)
10/09/2016  4:29 PM  PATIENT:  Brandi Dickson DOB: 05/26/1960 MRN: [id number]  PREOP DIAGNOSIS:   LEFT BREAST CANCER  POSTOP DIAGNOSIS:    Left breast cacner, 2 o'clock position (T2, N0)  PROCEDURE:   Procedure(s): LEFT BREAST LUMPECTOMY WITH RADIOACTIVE SEED AND LEFT AXILLARY SENTINEL LYMPH NODE BIOPSY, Injection of peri areolar area of breast with methylene blue (1.0 cc), deep sentinel lymph node biopsy [single incision]  SURGEON:   [name], M.D.  ANESTHESIA:   general  Anesthesiologist: Murvin Natal, MD CRNA: Maryella Shivers, CRNA; Marrianne Mood, CRNA  General  EBL:  75  ml  DRAINS:  19 French Blake drain  LOCAL MEDICATIONS USED:   20 cc of 1/4% marcaine, left pectoral muscle block by anesthesia  SPECIMEN:   Left breast lumpectomy (6 color paint), Medial margin (suture anterior), left axillary sentinel lymph node (blue, counts - 600, background - 0)  COUNTS CORRECT:  YES  INDICATIONS FOR PROCEDURE:  Brandi Dickson is a 56 y.o. (DOB: April 28, 1960) white female whose primary care physician is Brandi Melter, MD and comes for left breast lumpectomy and left axillary sentinel lymph node biopsy.   Ms. Brandi Dickson was seen in the [facility] clinic with Drs. Burr Medico and Kinard on 05/02/2016 for a left breast T2 invasive ductal carcinoma, ER - negative, and Her2Neu positive breast cancer. She has undergone neoadjuvant chemotherapy with a decrease in the size of the left breast cancer from 4.0 x 2.7 x 3.1 cm to 2.0 x 1.6 x 2.5 cm.   The options for breast cancer treatment have been discussed with the patient. She elected to proceed with lumpectomy and axillary sentinel lymph node.     The indications and potential complications of surgery were explained to the patient. Potential complications include, but are not limited to, bleeding, infection, the need for further surgery, and nerve injury.     She had a I131 seed placed on 10/08/2016 in her left breast at [facility].  I  confirmed the presence of the I131 seed in the pre op area using the Neoprobe.  The seed is in the 2 o'clock position of the left breast.   In the holding area, her left areola was injected with 1 millicurie of Technitium Sulfur Colloid.  OPERATIVE NOTE:   The patient was taken to room # 2 at [facility] where she underwent a general anesthesia  supervised by Anesthesiologist: Roanna Banning, Karyl Kinnier, MD CRNA: Maryella Shivers, CRNA; Marrianne Mood, CRNA. Her left breast and axilla were prepped with  ChloraPrep and sterilely draped.    A time-out and the surgical check list was reviewed.    I injected about 0.5 mL of 40% methylene blue around her left areola.   I turned attention to the cancer which was about at the 2 o'clock position of the left breast.   I used the Neoprobe to identify the I131 seed.  I tried to excise an area around the tumor of at least 1 cm.    I excised this block of breast tissue approximately 4 cm by 6 cm  in diameter.   I painted the lumpectomy specimen with the 6 color paint kit and did a specimen mammogram which confirmed the mass, clip, and the seed were all in the right position in the specimen.  The specimen was sent to pathology who called back to confirm that they have the seed and the specimen.   I thought, if anything, I was close  to the medial margin, so I took additional medial margin and painted it and sent it separately.   I then started the left deep axillary sentinel lymph node biopsy. Because my lumpectomy was in the upper outer quadrant of the left breast, I could get to the axilla without enlarging the incision, I went through the breast lumpectomy incision for the left axillary lymph node.   I found a hot area at the junction of the breast and the pectoralis major muscle, deep in the axilla. I cut down and  identified a hot node that had counts of 600 and the background has 0 counts. The lymph node was blue. I checked her internal mammary nodes and  supraclavicular nodes with the neoprobe and found no other hot area. The axillary node was then sent to pathology.    I then irrigated the wound with saline. I infiltrated approximately 20 mL of 1/4% Marcaine in the incision. I placed a 69 [place] Blake drain in the left inframammary fold, sewed the drain in place with a 2-0 nylon.  I placed 4 clips to mark biopsy cavity, at 12, 3, 6, and 9 o'clock.  I then closed the wound in layers using 3-0 Vicryl sutures for the deep layer. At the skin, I closed the incision with a 4-0 Monocryl suture. The incision was then painted with Dermabond.  She had gauze place over the wound and placed in a breast binder.   The patient tolerated the procedure well, was transported to the recovery room in good condition. Sponge and needle count were correct at the end of the case.   Final pathology is pending.   [name], MD, [facility] Pager: [phone] Office phone:  [phone]

## 2016-10-09 NOTE — Anesthesia Postprocedure Evaluation (Signed)
Anesthesia Post Note  Patient: Brandi Dickson  Procedure(s) Performed: Procedure(s) (LRB): LEFT BREAST LUMPECTOMY WITH RADIOACTIVE SEED AND L4EFT AXILLARY SENTINEL LYMPH NODE BIOPSY (Left)     Patient location during evaluation: PACU Anesthesia Type: General Level of consciousness: awake and alert Pain management: pain level controlled Vital Signs Assessment: post-procedure vital signs reviewed and stable Respiratory status: spontaneous breathing, nonlabored ventilation and respiratory function stable Cardiovascular status: blood pressure returned to baseline and stable Postop Assessment: no signs of nausea or vomiting Anesthetic complications: no    Last Vitals:  Vitals:   10/09/16 1640 10/09/16 1641  BP: 125/83   Pulse: 65 73  Resp: 12 14  Temp: 36.4 C   SpO2: 100% 100%    Last Pain:  Vitals:   10/09/16 1700  PainSc: 2                  Axl Rodino A.

## 2016-10-09 NOTE — Anesthesia Procedure Notes (Signed)
Anesthesia Regional Block: Pectoralis block   Pre-Anesthetic Checklist: ,, timeout performed, Correct Patient, Correct Site, Correct Laterality, Correct Procedure, Correct Position, site marked, Risks and benefits discussed,  Surgical consent,  Pre-op evaluation,  At surgeon's request and post-op pain management  Laterality: Left  Prep: chloraprep       Needles:   Needle Type: Stimiplex     Needle Length: 9cm      Additional Needles:   Procedures: ultrasound guided,,,,,,,,  Narrative:  Start time: 10/09/2016 2:10 PM End time: 10/09/2016 2:16 PM Injection made incrementally with aspirations every 5 mL.  Performed by: Personally  Anesthesiologist: Nolon Nations  Additional Notes: Patient tolerated well. Good fascial spread noted.

## 2016-10-09 NOTE — Interval H&P Note (Signed)
History and Physical Interval Note:  10/09/2016 2:24 PM  Brandi Dickson  has presented today for surgery, with the diagnosis of LEFT BREAST CANCER  The various methods of treatment have been discussed with the patient and family.   Seed placed yesterday, 10/08/2016.  Her husband is with her.   After consideration of risks, benefits and other options for treatment, the patient has consented to  Procedure(s): LEFT BREAST LUMPECTOMY WITH RADIOACTIVE SEED AND L4EFT AXILLARY SENTINEL LYMPH NODE BIOPSY (Left) as a surgical intervention .  The patient's history has been reviewed, patient examined, no change in status, stable for surgery.  I have reviewed the patient's chart and labs.  Questions were answered to the patient's satisfaction.     Kimimila Tauzin H

## 2016-10-10 ENCOUNTER — Encounter (HOSPITAL_BASED_OUTPATIENT_CLINIC_OR_DEPARTMENT_OTHER): Payer: Self-pay | Admitting: Surgery

## 2016-10-11 ENCOUNTER — Encounter (HOSPITAL_BASED_OUTPATIENT_CLINIC_OR_DEPARTMENT_OTHER): Payer: Self-pay | Admitting: Surgery

## 2016-10-18 ENCOUNTER — Encounter: Payer: Self-pay | Admitting: Radiation Oncology

## 2016-10-18 ENCOUNTER — Other Ambulatory Visit: Payer: Self-pay | Admitting: *Deleted

## 2016-10-18 DIAGNOSIS — C50412 Malignant neoplasm of upper-outer quadrant of left female breast: Secondary | ICD-10-CM

## 2016-10-18 DIAGNOSIS — Z170 Estrogen receptor positive status [ER+]: Principal | ICD-10-CM

## 2016-10-19 NOTE — Progress Notes (Signed)
Location of Breast Cancer: upper-outer quadrant of left female breast   Histology per Pathology Report:   10/09/16 Diagnosis 1. Breast, lumpectomy, Left - INVASIVE DUCTAL CARCINOMA, GRADE 3, SPANNING 2.4 CM. - HIGH GRADE DUCTAL CARCINOMA IN SITU WITH NECROSIS. - RESECTION MARGINS ARE NEGATIVE FOR CARCINOMA. - BIOPSY SITE. - SEE ONCOLOGY TABLE. 2. Breast, excision, Left additional medial margin - FIBROCYSTIC CHANGE. - NO MALIGNANCY IDENTIFIED. 3. Lymph node, sentinel, biopsy, Left Axillary - METASTATIC CARCINOMA IN ONE OF ONE LYMPH NODES (1/1).  04/25/16 Diagnosis Breast, left, needle core biopsy - INVASIVE DUCTAL CARCINOMA, SEE COMMENT. - DUCTAL CARCINOMA IN SITU. - LYMPHOVASCULAR INVASION PRESENT.  Receptor Status: ER(30%), PR (0%), Her2-neu (positive), Ki-(30%)  Did patient present with symptoms (if so, please note symptoms) or was this found on screening mammography?: presented with a palpable left breast lump approximately 2 weeks ago  Past/Anticipated interventions by surgeon, if any: 10/09/16 Procedure: LEFT BREAST LUMPECTOMY WITH RADIOACTIVE SEED AND L4EFT AXILLARY SENTINEL LYMPH NODE BIOPSY;  Surgeon: [name], MD  Past/Anticipated interventions by medical oncology, if any: 05/11/16 - 08/31/16 Cycle 1 Docetaxel, Carboplatin, Herceptin and pejeta (TCHP), cycle 2 Docetaxel changed to Taxol, carbo dose reduced to AUC 4.5 (from 5) due to severe diarrhea and cytopenia, Perjeta held after cycle 2. Due to poor toleration, [name] was held after cycle 4 and Taxol changed to weekly X4. Herceptin was continued during the above chemo.  Maintenance Herceptin every 3 weeks, started on 09/15/2016.  Lymphedema issues, if any:  no  Pain issues, if any:  no   SAFETY ISSUES:  Prior radiation? no  Pacemaker/ICD? no  Possible current pregnancy?no  Is the patient on methotrexate? no  Current Complaints / other details:    BP 129/67 (BP Location: Right Arm, Patient Position: Sitting)    Pulse 62   Temp 98.6 F (37 C) (Oral)   Ht 5' 4.5" (1.638 m)   Wt 222 lb 6.4 oz (100.9 kg)   SpO2 100%   BMI 37.59 kg/m    Wt Readings from Last 3 Encounters:  10/24/16 222 lb 6.4 oz (100.9 kg)  10/09/16 220 lb (99.8 kg)  09/14/16 215 lb (97.5 kg)      Jacqulyn Liner, RN 10/19/2016,3:17 PM

## 2016-10-24 ENCOUNTER — Ambulatory Visit
Admission: RE | Admit: 2016-10-24 | Discharge: 2016-10-24 | Disposition: A | Discharge status: Home / Self Care | Payer: 59 | Source: Ambulatory Visit | Attending: Radiation Oncology | Admitting: Radiation Oncology

## 2016-10-24 ENCOUNTER — Encounter: Payer: Self-pay | Admitting: Radiation Oncology

## 2016-10-24 DIAGNOSIS — Z170 Estrogen receptor positive status [ER+]: Secondary | ICD-10-CM | POA: Diagnosis not present

## 2016-10-24 DIAGNOSIS — E119 Type 2 diabetes mellitus without complications: Secondary | ICD-10-CM | POA: Diagnosis not present

## 2016-10-24 DIAGNOSIS — Z79891 Long term (current) use of opiate analgesic: Secondary | ICD-10-CM | POA: Insufficient documentation

## 2016-10-24 DIAGNOSIS — Z808 Family history of malignant neoplasm of other organs or systems: Secondary | ICD-10-CM | POA: Diagnosis not present

## 2016-10-24 DIAGNOSIS — I10 Essential (primary) hypertension: Secondary | ICD-10-CM | POA: Insufficient documentation

## 2016-10-24 DIAGNOSIS — M1990 Unspecified osteoarthritis, unspecified site: Secondary | ICD-10-CM | POA: Insufficient documentation

## 2016-10-24 DIAGNOSIS — Z7984 Long term (current) use of oral hypoglycemic drugs: Secondary | ICD-10-CM | POA: Insufficient documentation

## 2016-10-24 DIAGNOSIS — Z510 Encounter for antineoplastic radiation therapy: Secondary | ICD-10-CM | POA: Insufficient documentation

## 2016-10-24 DIAGNOSIS — Z171 Estrogen receptor negative status [ER-]: Secondary | ICD-10-CM

## 2016-10-24 DIAGNOSIS — C50412 Malignant neoplasm of upper-outer quadrant of left female breast: Secondary | ICD-10-CM

## 2016-10-24 DIAGNOSIS — Z90710 Acquired absence of both cervix and uterus: Secondary | ICD-10-CM | POA: Diagnosis not present

## 2016-10-24 DIAGNOSIS — Z9889 Other specified postprocedural states: Secondary | ICD-10-CM | POA: Diagnosis not present

## 2016-10-24 DIAGNOSIS — Z87891 Personal history of nicotine dependence: Secondary | ICD-10-CM | POA: Insufficient documentation

## 2016-10-24 DIAGNOSIS — Z923 Personal history of irradiation: Secondary | ICD-10-CM | POA: Insufficient documentation

## 2016-10-24 DIAGNOSIS — Z803 Family history of malignant neoplasm of breast: Secondary | ICD-10-CM | POA: Diagnosis not present

## 2016-10-24 DIAGNOSIS — Z79899 Other long term (current) drug therapy: Secondary | ICD-10-CM | POA: Diagnosis not present

## 2016-10-24 DIAGNOSIS — C773 Secondary and unspecified malignant neoplasm of axilla and upper limb lymph nodes: Secondary | ICD-10-CM | POA: Diagnosis not present

## 2016-10-24 NOTE — Progress Notes (Signed)
Radiation Oncology         [phone] ________________________________  Name: Brandi Dickson MRN: [id number]  Date: 10/24/2016  DOB: 12-24-1960  Re-Evaluation Visit Note  CC: Orpah Melter, MD  Truitt Merle, MD    ICD-10-CM   1. Malignant neoplasm of upper-outer quadrant of left female breast, unspecified estrogen receptor status ([place]) C50.412     Diagnosis:  The encounter diagnosis was Malignant neoplasm of upper-outer quadrant of left breast in female [facility]). Stage IIA (ypT2, ypN1a)   Narrative:  The patient returns today for re-evaluation since multidisciplinary breast clinic on 05/02/16. Pathology revealed stage IIA, ypT2, ypN1a  ER+, PR-, HER2+ grade 3 invasive ductal carcinoma with DCIS of the left breast. Since evaluation, patient underwent chemotherapy from 05/11/16-08/31/16. Cycle 1 included Docetacel, Carboplatin, Herceptin and Pejeta (TCHP). Patient reports following the first cycle, she presented to ICU with bloody diarrhea due to colitis interacting with chemotherapy. On cycle 2, Docetacel was changed to Taxol, and Cabroplatin dose was reduced from 5 to 4.5 due to severe diarrhea and cytopenia. Perjeta was held after cycle 2, Carboplatin was held after cycle 4 due to poor toleration, and Taxol was changed to weekly x 4. Herceptin was continued throughout chemo. Maintenance Herceptin every 3 weeks started on 09/15/16.  Patient underwent bilateral breast MRI on 09/17/16. This showed a smaller left breast mass, now measuring 2.5 cm. No new of suspicious enhancement in either breast.  Patient underwent left breast lumpectomy with radioactive seed and left axillary sentinel lymph node biopsy by Dr. Lucia Gaskins on 10/09/16. Lumpectomy confirmed invasive ductal carcinoma, grade 3, spanning 2.4 cm with high grade ductal carcinoma in situ with necrosis, and negative resection margins. Left axillary lymph node showed metastatic carcinoma in one lymph node (1/1).     Patient presents today  with no complaints. She denies lymphadenopathy or pain. She will be evaluated for radiation therapy as part of treatment.           ALLERGIES:  is allergic to lisinopril; augmentin [amoxicillin-pot clavulanate]; benadryl [diphenhydramine]; and losartan potassium.  Meds: Current Outpatient Prescriptions  Medication Sig Dispense Refill  . albuterol (PROVENTIL HFA;VENTOLIN HFA) 108 (90 Base) MCG/ACT inhaler Inhale 1-2 puffs into the lungs every 6 (six) hours as needed for wheezing or shortness of breath.    . ferrous sulfate (FERROUSUL) 325 (65 FE) MG tablet Take 1 tablet (325 mg total) by mouth 3 (three) times daily with meals.    . gabapentin (NEURONTIN) 100 MG capsule Take 200 mg by mouth at bedtime.     [name] Kitchen HYDROcodone-acetaminophen (NORCO/VICODIN) 5-325 MG tablet Take 1-2 tablets by mouth every 6 (six) hours as needed for moderate pain. 20 tablet 0  . magnesium oxide (MAG-OX) 400 MG tablet Take 400 mg by mouth 2 (two) times daily.    . Mesalamine (ASACOL HD) 800 MG TBEC Take 2 tablets (1,600 mg total) by mouth 3 (three) times daily. 90 tablet 5  . metFORMIN (GLUCOPHAGE-XR) 500 MG 24 hr tablet Take 1,000 mg by mouth at bedtime.    . metoprolol (LOPRESSOR) 50 MG tablet Take 50 mg by mouth at bedtime. Takes 1 in the am and 1 at HS    . niacin 250 MG tablet Take 250 mg by mouth at bedtime.     . potassium chloride SA (K-DUR,KLOR-CON) 20 MEQ tablet Take 1 tablet (20 mEq total) by mouth 2 (two) times daily. (Patient taking differently: Take 20 mEq by mouth daily. ) 60 tablet 1  . triamterene-hydrochlorothiazide (MAXZIDE) 75-50  MG per tablet Take 0.5 tablets by mouth daily.     . diazepam (VALIUM) 5 MG tablet Take 1 tablet (5 mg total) by mouth as directed. Take 5 mg by mouth 30 minutes prior to MRI procedure.   May repeat x 1. DO NOT Drive after taking Valium as it may cause drowsiness. (Patient not taking: Reported on 10/24/2016) 2 tablet 0  . dicyclomine (BENTYL) 20 MG tablet Take 1 tablet (20 mg  total) by mouth 4 (four) times daily -  before meals and at bedtime. (Patient not taking: Reported on 10/24/2016) 30 tablet 0  . diphenoxylate-atropine (LOMOTIL) 2.5-0.025 MG tablet Take one to two tablets every six hours as needed for diarrhea (Patient not taking: Reported on 10/24/2016) 120 tablet 1  . hydrocortisone (ANUSOL-HC) 25 MG suppository Place 1 suppository (25 mg total) rectally 2 (two) times daily. (Patient not taking: Reported on 10/24/2016) 12 suppository 0  . lidocaine-prilocaine (EMLA) cream Apply 1 application to skin 1.5 to 2 hrs before use.  Cover to secure cream with plastic wrap. (Patient not taking: Reported on 10/24/2016) 30 g PRN  . loperamide (IMODIUM) 1 MG/5ML solution Take 3 mg by mouth as needed for diarrhea or loose stools.    [name] Kitchen LORazepam (ATIVAN) 0.5 MG tablet Take 1 tablet (0.5 mg total) by mouth once as needed for anxiety. (Patient not taking: Reported on 10/24/2016) 2 tablet 0  . ondansetron (ZOFRAN) 8 MG tablet Take 1 tablet (8 mg total) by mouth 2 (two) times daily as needed for refractory nausea / vomiting. Start on day 3 after chemo. (Patient not taking: Reported on 10/24/2016) 30 tablet 1  . predniSONE (DELTASONE) 20 MG tablet Take 10 mg by mouth daily.     . prochlorperazine (COMPAZINE) 10 MG tablet Take 1 tablet (10 mg total) by mouth every 6 (six) hours as needed (Nausea or vomiting). (Patient not taking: Reported on 10/24/2016) 30 tablet 1  . Specialty Vitamins Products (VITAMINS FOR THE HAIR) TABS Take 2 tablets by mouth 2 (two) times daily.    . traMADol (ULTRAM) 50 MG tablet Take 1-2 tablets (50-100 mg total) by mouth every 6 (six) hours as needed. (Patient not taking: Reported on 10/24/2016) 40 tablet 0   No current facility-administered medications for this encounter.    Facility-Administered Medications Ordered in Other Encounters  Medication Dose Route Frequency Provider Last Rate Last Dose  . heparin lock flush 100 unit/mL  500 Units Intracatheter Once PRN  Truitt Merle, MD      . sodium chloride flush (NS) 0.9 % injection 10 mL  10 mL Intracatheter PRN Truitt Merle, MD      . sodium chloride flush (NS) 0.9 % injection 10 mL  10 mL Intracatheter PRN Truitt Merle, MD   10 mL at 08/24/16 1725    Physical Findings: The patient is in no acute distress. Patient is alert and oriented.  height is 5' 4.5" (1.638 m) and weight is 222 lb 6.4 oz (100.9 kg). Her oral temperature is 98.6 F (37 C). Her blood pressure is 129/67 and her pulse is 62. Her oxygen saturation is 100%. .  No significant changes. Lungs are clear to auscultation bilaterally. Heart has regular rate and rhythm. No palpable cervical, supraclavicular, or axillary adenopathy. Abdomen soft, non-tender, normal bowel sounds. Right breast no palpable mass or nipple discharge. Port-a-cath present in the right upper chest. Left breast patient has a scar in the upper outer quadrant that is healing without signs of drainage or infection, "  surgical glue" in place. No palpable mass or nipple discharge in bilateral breasts. Patient has good range of movement in left arm and shoulder.  Lab Findings: Lab Results  Component Value Date   WBC 6.2 10/05/2016   HGB 11.8 10/05/2016   HCT 36.4 10/05/2016   MCV 96.8 10/05/2016   PLT 246 10/05/2016    Radiographic Findings: No results found.  Impression: 56 y.o. woman with stage IIA, ypT2 ypN1a  ER+, PR-, HER2+ grade 3 invasive ductal carcinoma with DCIS of the left breast. Patient is post neo-adjuvant chemotherapy x 4 cycles, and post left lumpectomy. The patient would be a good candidate for adjuvant radiation therapy directed to the left breast. Since the sentinel lymph node is positive, I would recommend elective coverage of the axillary region. The patient would not be a good candidate for hypofractionated treatment in light of the need to cover the axillary region. We discussed the possibility of utilizing deep inspiration breath hold technique. I discussed the  course of treatment, side effects, and potential toxicities of radiation therapy with the patient. She appears to understand and wishes to proceed with planned course of treatment. A consent form was signed and a copy was placed in the patient's chart.    Plan:  Patient will be scheduled for CT simulation and treatment planning on 11/13/16 at 8 am. Anticipate treatment to begin the following week, approximately 6 weeks post-op. I anticipate 6.5 weeks of radiation therapy.  ____________________________________    This document serves as a record of services personally performed by Gery Pray, MD. It was created on his behalf by Bethann Humble, a trained medical scribe. The creation of this record is based on the scribe's personal observations and the provider's statements to them. This document has been checked and approved by the attending provider.

## 2016-10-24 NOTE — Progress Notes (Signed)
Please see the Nurse Progress Note in the MD Initial Consult Encounter for this patient. 

## 2016-10-24 NOTE — Progress Notes (Signed)
[place]  Telephone:([phone]) [phone] Fax:(336) [phone]  Clinic Follow Up Note   Patient Care Team: Brandi Melter, MD as PCP - General (Family Medicine) Brandi Overall, MD as Consulting Physician (General Surgery) Brandi Merle, MD as Consulting Physician (Hematology) Brandi Pray, MD as Consulting Physician (Radiation Oncology) Brandi Artist, MD as Consulting Physician (Gastroenterology) 10/26/2016   CHIEF COMPLAINTS:  Follow up left breast cancer  Oncology History   Cancer Staging Breast cancer of upper-outer quadrant of left female breast [facility]) Staging form: Breast, AJCC 8th Edition - Clinical stage from 04/25/2016: Stage IIA (cT2, cN0, cM0, G3, ER: Negative, PR: Negative, HER2: Positive) - Signed by Brandi Merle, MD on 05/02/2016 - Pathologic stage from 10/09/2016: No Stage Recommended (ypT2, pN1a, cM0, G3, ER: Negative, PR: Negative, HER2: Positive) - Signed by Brandi Merle, MD on 10/26/2016       Breast cancer of upper-outer quadrant of left female breast ([place])   04/24/2016 Mammogram    Category B breasts with a new irregular mass in the UOQ left breast middle depth. Ultrasound revealed a 3.9 cm mass in the 1:00 position. The left axilla was negative.       04/25/2016 Initial Biopsy    Biopsy of the left breast showed grade 3 invasive ductal carcinoma, DCIS, and lymphovascular invasion was present.      04/25/2016 Receptors her2    ER 0% negative, PR 0% negative, HER2 positive, Ki67 30%      05/02/2016 Initial Diagnosis    Breast cancer of upper-outer quadrant of left female breast ([place])     05/07/2016 Imaging    MRI of the bilateral breast 05/07/16 IMPRESSION: Lobulated enhancing mass (4.0 x 2.7 x 3.1 cm) in the upper-outer quadrant of the left breast corresponding with the recently diagnosed invasive mammary carcinoma. Linear enhancement extends 2.6 cm posterior to the mass worrisome for ductal carcinoma in-situ.      05/09/2016 Echocardiogram    Echo  05/09/16 -LF EF: 55-60%      05/11/2016 - 08/31/2016 Neo-Adjuvant Chemotherapy    Cycle 1 Docetaxel, Carboplatin, Herceptin and pejeta (TCHP), cycle 2 Docetaxel changed to Taxol, carbo dose reduced to AUC 4.5 (from 5) due to severe diarrhea and cytopenia, Perjeta held after cycle 2.   Due to poor toleration, Brandi Dickson was held after cycle 4 and Taxol changed to weekly X4. Herceptin was continued during the above chemo.        05/13/2016 Imaging    CT Angio Chest PE IMPRESSION: No evidence of pulmonary emboli. Left breast mass consistent with the given clinical history. Stable left adrenal lesion likely representing a small adenoma.      05/18/2016 - 05/24/2016 Hospital Admission    Patient presented with nausea, vomiting, and diarrhea; admitted to hospital with Hyponatremia      06/07/2016 - 06/09/2016 Hospital Admission    Patient presents to hospital complaints of rectal bleeding and abdominal cramps when defacating      06/07/2016 Imaging    CT ABDOMEN PELVIS W CONTRAST  IMPRESSION: 1. Wall thickening of the descending and sigmoid colon consistent with an infectious or inflammatory colitis. 2. There is also a short segment of narrowing and possible wall thickening of the superior ascending colon which could reflect a constricting mass or, more likely, be an area of persistent colonic spasm. This could be further assessed with either colonoscopy or a barium enema after the current symptoms of colitis have resolved. 3. Small, subcentimeter, low-density liver lesions which may all be benign. However,  3 these are not evident on prior CT. Liver metastatic disease possible. These could be further assessed with liver MRI with and without contrast. 4. Adrenal lesions which are stable, on the right and myelolipoma and on the left most likely an adenoma.      06/07/2016 Imaging    CT A/P IMPRESSION: 1. Wall thickening of the descending and sigmoid colon consistent with an infectious or  inflammatory colitis. 2. There is also a short segment of narrowing and possible wall thickening of the superior ascending colon which could reflect a constricting mass or, more likely, be an area of persistent colonic spasm. This could be further assessed with either colonoscopy or a barium enema after the current symptoms of colitis have resolved. 3. Small, subcentimeter, low-density liver lesions which may all be benign. However, 3 these are not evident on prior CT. Liver metastatic disease possible. These could be further assessed with liver MRI with and without contrast. 4. Adrenal lesions which are stable, on the right and myelolipoma and on the left most likely an adenoma.      06/07/2016 - 06/09/2016 Hospital Admission    Diarrhea and rectal Bleeding      07/19/2016 - 07/20/2016 Hospital Admission    Diarrhea and dehydration      08/09/2016 Mammogram    Mammogram 08/09/16 IMPRESSION:  The 3.1 cm x 2 cm x 1.5 cm irregular equal density mass in the left breast is consistent with the known carcinoma showing mammographic evidence of preoperative chemotherapy response.         08/09/2016 Imaging    [place] of left breast 08/09/16 IMPRESSION:  The 2.7 cm lobulated mass in the left breast is a known biopsy positive for malignancy. Surgical and oncology consult in progress.      08/16/2016 Imaging    MRI Abdomen W WO Contrast IMPRESSION: Tiny sub-cm hepatic cysts. No evidence of metastatic disease or other acute findings. Tiny benign left adrenal adenoma and right adrenal myelolipoma.      09/14/2016 -  Chemotherapy    Maintenance Herceptin every 3 weeks.      09/17/2016 Imaging    MRI Breast Bilateral 09/17/16 IMPRESSION: Smaller left breast mass, now measuring 2.5 cm. No new or suspicious enhancement in either breast.  RECOMMENDATION: Treatment plan.      10/09/2016 Surgery    LEFT BREAST LUMPECTOMY WITH RADIOACTIVE SEED AND L4EFT AXILLARY SENTINEL LYMPH NODE BIOPSY by Dr.  Lucia Dickson on 10/09/16      10/09/2016 Pathology Results    Diagnosis 10/09/16 1. Breast, lumpectomy, Left - INVASIVE DUCTAL CARCINOMA, GRADE 3, SPANNING 2.4 CM. - HIGH GRADE DUCTAL CARCINOMA IN SITU WITH NECROSIS. - RESECTION MARGINS ARE NEGATIVE FOR CARCINOMA. - BIOPSY SITE. - SEE ONCOLOGY TABLE. 2. Breast, excision, Left additional medial margin - FIBROCYSTIC CHANGE. - NO MALIGNANCY IDENTIFIED. 3. Lymph node, sentinel, biopsy, Left Axillary - METASTATIC CARCINOMA IN ONE OF ONE LYMPH NODES (1/1).         HISTORY OF PRESENTING ILLNESS (05/02/16):  Brandi Dickson 56 y.o. female is here because of a new diagnosis of left breast cancer. She is accompanied by her husband to our multidisciplinary breast clinic today.  The patient presented with a palpable left breast lump approximately 2 weeks ago. Bilateral diagnostic mammogram on 04/24/16 showed Category B breasts with a new irregular mass in the UOQ left breast middle depth. Ultrasound performed on 04/24/16 revealed a 3.9 cm mass in the 1:00 position. The left axilla was negative.  Biopsy of the  left breast on 04/25/16 showed grade 3 invasive ductal carcinoma, DCIS, and lymphovascular invasion was present (ER 0% negative, PR 0% negative, HER2 positive, Ki67 30%).  She denies tenderness of the biopsied area. Reports minor dimpling. The patient is 8 weeks out from a left hip replacement. She had a right hip replacement 2010 years ago.  The patient and her husband present today in multidisciplinary breast clinic to discuss treatment options for the management of her disease.  The patient is taking Neurontin for neuropathy in her feet from diabetes. She has been diagnosed for diabetes for the past 4 years. She states she only has neuropathy at night and take 2 Neurontin at night and then has no symptoms.  GYN HISTORY  Menarchal: 9 LMP: Complete hysterectomy for uterine fibroids ~ 2009. She was bleeding too much with her last menstrual cycle  lasting 6 weeks. No issues with menopause. Contraceptive: no  HRT: No GP: G1P0  CURRENT THERAPY:  1. Maintenance Herceptin every 3 weeks, started on 06/01/16. Add Perjeta back starting 10/26/16. 2. Xeloda to start 10/15. With radiation she will be on low dose 3 tablets in the am and 3 tablets in the pm. Without radiation she will be on full dose 4 tablets in the am and 4 tablets in the pm.    INTERVAL HISTORY:  Brandi Dickson returns for follow up post. She presents to the clinic today post lumpectomy. She notes recovery went fine and she had the draining tube for a week. She has no residual pain and can raise her arm with no issues. She has seen Dr. Lucia Dickson since surgery.  Pt reports she is willing to do whatever treatment plan we think is best for her.     MEDICAL HISTORY:  Past Medical History:  Diagnosis Date  . Arthritis   . Asthma    triggered with [name] perfumes and cigarette smoke  . Cancer ([place])   . Colitis   . Diabetes mellitus without complication ([place])   . Hypertension   . Neuropathy     SURGICAL HISTORY: Past Surgical History:  Procedure Laterality Date  . ABDOMINAL HYSTERECTOMY    . BREAST LUMPECTOMY WITH RADIOACTIVE SEED AND SENTINEL LYMPH NODE BIOPSY Left 10/09/2016   Procedure: LEFT BREAST LUMPECTOMY WITH RADIOACTIVE SEED AND L4EFT AXILLARY SENTINEL LYMPH NODE BIOPSY;  Surgeon: Brandi Overall, MD;  Location: [place];  Service: General;  Laterality: Left;  . DILATION AND CURETTAGE OF UTERUS    . KNEE ARTHROSCOPY Left   . PORTACATH PLACEMENT Right 05/08/2016   Procedure: INSERTION PORT-A-CATH WITH [place];  Surgeon: Brandi Overall, MD;  Location: [place];  Service: General;  Laterality: Right;  . TONSILLECTOMY    . TOTAL HIP ARTHROPLASTY Right   . TOTAL HIP ARTHROPLASTY Left 03/06/2016   Procedure: LEFT TOTAL HIP ARTHROPLASTY ANTERIOR APPROACH;  Surgeon: [name], MD;  Location: [facility];  Service: Orthopedics;  Laterality:  Left;    SOCIAL HISTORY: Social History   Social History  . Marital status: Married    Spouse name: N/A  . Number of children: 0  . Years of education: N/A   Occupational History  . Not on file.   Social History Main Topics  . Smoking status: Former Smoker    Packs/day: 1.00    Years: 29.00    Types: Cigarettes    Quit date: 10/27/2002  . Smokeless tobacco: Never Used  . Alcohol use No  . Drug use: No  . Sexual  activity: Yes    Birth control/ protection: Surgical   Other Topics Concern  . Not on file   Social History Narrative  . No narrative on file    FAMILY HISTORY: Family History  Problem Relation Age of Onset  . Colon cancer Father   . Stomach cancer Paternal Uncle   . Stomach cancer Paternal Uncle   . Melanoma Brother   . Thyroid cancer Brother   . Breast cancer Maternal Aunt   . Breast cancer Maternal Aunt   . Breast cancer Cousin     ALLERGIES:  is allergic to lisinopril; augmentin [amoxicillin-pot clavulanate]; benadryl [diphenhydramine]; and losartan potassium.  MEDICATIONS:  Current Outpatient Prescriptions  Medication Sig Dispense Refill  . albuterol (PROVENTIL HFA;VENTOLIN HFA) 108 (90 Base) MCG/ACT inhaler Inhale 1-2 puffs into the lungs every 6 (six) hours as needed for wheezing or shortness of breath.    . diazepam (VALIUM) 5 MG tablet Take 1 tablet (5 mg total) by mouth as directed. Take 5 mg by mouth 30 minutes prior to MRI procedure.   May repeat x 1. DO NOT Drive after taking Valium as it may cause drowsiness. 2 tablet 0  . dicyclomine (BENTYL) 20 MG tablet Take 1 tablet (20 mg total) by mouth 4 (four) times daily -  before meals and at bedtime. 30 tablet 0  . diphenoxylate-atropine (LOMOTIL) 2.5-0.025 MG tablet Take one to two tablets every six hours as needed for diarrhea 120 tablet 1  . ferrous sulfate (FERROUSUL) 325 (65 FE) MG tablet Take 1 tablet (325 mg total) by mouth 3 (three) times daily with meals.    . gabapentin (NEURONTIN) 100  MG capsule Take 200 mg by mouth at bedtime.     [name] Kitchen HYDROcodone-acetaminophen (NORCO/VICODIN) 5-325 MG tablet Take 1-2 tablets by mouth every 6 (six) hours as needed for moderate pain. 20 tablet 0  . hydrocortisone (ANUSOL-HC) 25 MG suppository Place 1 suppository (25 mg total) rectally 2 (two) times daily. 12 suppository 0  . lidocaine-prilocaine (EMLA) cream Apply 1 application to skin 1.5 to 2 hrs before use.  Cover to secure cream with plastic wrap. 30 g PRN  . loperamide (IMODIUM) 1 MG/5ML solution Take 3 mg by mouth as needed for diarrhea or loose stools.    [name] Kitchen LORazepam (ATIVAN) 0.5 MG tablet Take 1 tablet (0.5 mg total) by mouth once as needed for anxiety. 2 tablet 0  . magnesium oxide (MAG-OX) 400 MG tablet Take 400 mg by mouth 2 (two) times daily.    . Mesalamine (ASACOL HD) 800 MG TBEC Take 2 tablets (1,600 mg total) by mouth 3 (three) times daily. 90 tablet 5  . metFORMIN (GLUCOPHAGE-XR) 500 MG 24 hr tablet Take 1,000 mg by mouth at bedtime.    . metoprolol (LOPRESSOR) 50 MG tablet Take 50 mg by mouth at bedtime. Takes 1 in the am and 1 at HS    . niacin 250 MG tablet Take 250 mg by mouth at bedtime.     . ondansetron (ZOFRAN) 8 MG tablet Take 1 tablet (8 mg total) by mouth 2 (two) times daily as needed for refractory nausea / vomiting. Start on day 3 after chemo. 30 tablet 1  . potassium chloride SA (K-DUR,KLOR-CON) 20 MEQ tablet Take 1 tablet (20 mEq total) by mouth 2 (two) times daily. (Patient taking differently: Take 20 mEq by mouth daily. ) 60 tablet 1  . predniSONE (DELTASONE) 20 MG tablet Take 10 mg by mouth daily.     [name] Kitchen  prochlorperazine (COMPAZINE) 10 MG tablet Take 1 tablet (10 mg total) by mouth every 6 (six) hours as needed (Nausea or vomiting). 30 tablet 1  . Specialty Vitamins Products (VITAMINS FOR THE HAIR) TABS Take 2 tablets by mouth 2 (two) times daily.    . traMADol (ULTRAM) 50 MG tablet Take 1-2 tablets (50-100 mg total) by mouth every 6 (six) hours as needed. 40 tablet  0  . triamterene-hydrochlorothiazide (MAXZIDE) 75-50 MG per tablet Take 0.5 tablets by mouth daily.     . capecitabine (XELODA) 500 MG tablet Take 4 tab in the morning and 3 tab in early evening, on the day of radiation (Monday through Friday) 105 tablet 1   No current facility-administered medications for this visit.    Facility-Administered Medications Ordered in Other Visits  Medication Dose Route Frequency Provider Last Rate Last Dose  . heparin lock flush 100 unit/mL  500 Units Intracatheter Once PRN Brandi Merle, MD      . sodium chloride flush (NS) 0.9 % injection 10 mL  10 mL Intracatheter PRN Brandi Merle, MD      . sodium chloride flush (NS) 0.9 % injection 10 mL  10 mL Intracatheter PRN Brandi Merle, MD   10 mL at 08/24/16 1725    REVIEW OF SYSTEMS:   Constitutional: Denies fevers, chills or abnormal night sweats  Eyes: Denies blurriness of vision, double vision or watery eyes Ears, nose, mouth, throat, and face: Denies mucositis or sore throat Respiratory: Denies cough, dyspnea or wheezes Cardiovascular: Denies palpitation.  Gastrointestinal:  Denies heartburn. Denies nausea, vomiting.  Skin: Denies abnormal skin rashes Lymphatics: Denies new lymphadenopathy or easy bruising Neurological: (+) neuropathy in hands/feet, unchanged Behavioral/Psych: Mood is stable, no new changes  All other systems were reviewed with the patient and are negative.   PHYSICAL EXAMINATION:  ECOG PERFORMANCE STATUS: 1 - Symptomatic but completely ambulatory  Vitals:   10/26/16 1256  BP: 130/77  Pulse: 82  Resp: 18  Temp: 98.6 F (37 C)  SpO2: 100%   Filed Weights   10/26/16 1256  Weight: 224 lb 9.6 oz (101.9 kg)     GENERAL:alert, no distress and comfortable SKIN: skin color, texture, turgor are normal, no rashes or significant lesions EYES: normal, conjunctiva are pink and non-injected, sclera clear OROPHARYNX:no exudate, no erythema and lips, buccal mucosa, and tongue normal  NECK:  supple, thyroid normal size, non-tender, without nodularity LYMPH:  no palpable lymphadenopathy in the cervical, axillary or inguinal LUNGS: clear to auscultation and percussion with normal breathing effort HEART: regular rate & rhythm and no murmurs and no lower extremity edema ABDOMEN:abdomen soft, non-tender and normal bowel sounds Musculoskeletal:no cyanosis of digits and no clubbing  PSYCH: alert & oriented x 3 with fluent speech NEURO: no focal motor/sensory deficits BREAST: (+) s/p lumpectomy inc in UOQ of left breast, healing well, no discharge, palpable mass or scar.   LABORATORY DATA:  I have reviewed the data as listed CBC Latest Ref Rng & Units 10/26/2016 10/05/2016 09/14/2016  WBC 3.9 - 10.3 10e3/uL 5.4 6.2 7.1  Hemoglobin 11.6 - 15.9 g/dL 11.8 11.8 11.2(L)  Hematocrit 34.8 - 46.6 % 37.2 36.4 34.6(L)  Platelets 145 - 400 10e3/uL 208 246 227   CMP Latest Ref Rng & Units 10/26/2016 10/05/2016 09/14/2016  Glucose 70 - 140 mg/dl 185(H) 127 136  BUN 7.0 - 26.0 mg/dL 18.7 19.7 24.8  Creatinine 0.6 - 1.1 mg/dL 0.8 0.8 0.8  Sodium 136 - 145 mEq/L 143 141 140  Potassium  3.5 - 5.1 mEq/L 3.0(LL) 3.5 3.5  Chloride 101 - 111 mmol/L - - -  CO2 22 - 29 mEq/L _0 Calcium 8.4 - 10.4 mg/dL 9.2 9.7 9.3  Total Protein 6.4 - 8.3 g/dL 6.4 6.7 6.4  Total Bilirubin 0.20 - 1.20 mg/dL 0.39 0.31 0.37  Alkaline Phos 40 - 150 U/L 84 79 81  AST 5 - 34 U/L _1 ALT 0 - 55 U/L _2 PATHOLOGY REPORT:    Diagnosis 10/09/16 1. Breast, lumpectomy, Left - INVASIVE DUCTAL CARCINOMA, GRADE 3, SPANNING 2.4 CM. - HIGH GRADE DUCTAL CARCINOMA IN SITU WITH NECROSIS. - RESECTION MARGINS ARE NEGATIVE FOR CARCINOMA. - BIOPSY SITE. - SEE ONCOLOGY TABLE. 2. Breast, excision, Left additional medial margin - FIBROCYSTIC CHANGE. - NO MALIGNANCY IDENTIFIED. 3. Lymph node, sentinel, biopsy, Left Axillary - METASTATIC CARCINOMA IN ONE OF ONE LYMPH NODES (1/1). Microscopic Comment 1. BREAST, STATUS  POST NEOADJUVANT TREATMENT Procedure: Left lumpectomy with additional medial margin excision, left axillary sentinel lymph node biopsy. Laterality: Left. Tumor Size: 2.4 cm. Histologic Type: Invasive ductal carcinoma. Grade: 3 Tubular Differentiation: 3 Nuclear Pleomorphism: 3 Mitotic Count: 3 Ductal Carcinoma in Situ (DCIS): Present, high grade with necrosis. Regional Lymph Nodes: Number of Lymph Nodes Examined: 1 Number of Sentinel Lymph Nodes Examined: 1 Lymph Nodes with Macrometastases: 1 Lymph Nodes with Micrometastases: 0 Lymph Nodes with Isolated Tumor Cells: 0 Margins: Invasive carcinoma, distance from closest margin: >0.5 cm all original margins. DCIS, distance from closest margin: >0.5 cm all original margins. Extent of Tumor: Confined to breast parenchyma. Breast Prognostic Profile (pre-neoadjuvant case #: [id number], see below). Estrogen Receptor: Negative. Progesterone Receptor: Negative. Her2: Positive(ratio 2.25). Ki-67: 30% 2 of 4 FINAL for [place], Maddyx P ([id number]) Microscopic Comment(continued) Will be repeated on the current case (Block #: [id number]) and the results reported separately. Residual Cancer Burden (RCB): Primary Tumor Bed: 24 mm x 20 mm Dickson Cancer Cellularity: 75% Percentage of Cancer that is in Situ: 5% Number of Positive Lymph Nodes: 1 Diameter of Largest Lymph Node metastasis: 3 mm Residual Cancer Burden : 3.439 Residual Cancer Burden Class: RCB-III Pathologic Stage Classification (p TNM, AJCC 8th Edition): Primary Tumor (ypT): ypT2 Regional Lymph Nodes (ypN): ypN1a 1. PROGNOSTIC INDICATORS Results: IMMUNOHISTOCHEMICAL AND MORPHOMETRIC ANALYSIS PERFORMED MANUALLY Estrogen Receptor: 30%, POSITIVE, WEAK STAINING INTENSITY Progesterone Receptor: 0%, NEGATIVE COMMENT: The negative hormone receptor study(ies) in this case has an internal positive control. REFERENCE RANGE ESTROGEN RECEPTOR NEGATIVE 0% POSITIVE =>1% REFERENCE RANGE  PROGESTERONE RECEPTOR NEGATIVE 0% POSITIVE =>1% All controls stained appropriately Enid Cutter MD Pathologist, Electronic Signature ( Signed 10/16/2016) 1. FLUORESCENCE IN-SITU HYBRIDIZATION Results: HER2 - **POSITIVE** RATIO OF HER2/CEP17 SIGNALS 2.67 AVERAGE HER2 COPY NUMBER PER CELL 4.00 Reference Range: NEGATIVE HER2/CEP17 Ratio <2.0 and average HER2 copy number <4.0 L DIAG NOSIS Diagnosis 04/25/16 Breast, left, needle core biopsy - INVASIVE DUCTAL CARCINOMA, SEE COMMENT. - DUCTAL CARCINOMA IN SITU. - LYMPHOVASCULAR INVASION PRESENT. Microscopic Comment The carcinoma appears grade 3 with focal squamous differentiation. Prognostic markers will be ordered. Dr. Lyndon Code has reviewed the case. The case was called to Dr. Isaiah Blakes on 04/26/2016. Vicente Males MD Pathologist, Electronic Signature (Case signed 04/26/2016) ADDITIONAL INFORMATION: 04/25/16 PROGNOSTIC INDICATORS Results: IMMUNOHISTOCHEMICAL AND MORPHOMETRIC ANALYSIS PERFORMED MANUALLY Estrogen Receptor: 0%, NEGATIVE Progesterone Receptor: 0%, NEGATIVE Proliferation Marker Ki67: 30% COMMENT: The negative hormone receptor study(ies) in this case has no internal positive control. REFERENCE RANGE ESTROGEN RECEPTOR NEGATIVE 0% POSITIVE =>1% REFERENCE RANGE PROGESTERONE RECEPTOR NEGATIVE 0%  POSITIVE =>1% All controls stained appropriately Enid Cutter MD Pathologist, Electronic Signature ( Signed 05/01/2016) FLUORESCENCE IN-SITU HYBRIDIZATION Results: HER2 - **POSITIVE** RATIO OF HER2/CEP17 SIGNALS 2.25 AVERAGE HER2 COPY NUMBER PER CELL 8.45 1 of 3 FINAL for [place], Vernell P ([id number]) ADDITIONAL INFORMATION:(continued) Reference Range: NEGATIVE HER2/CEP17 Ratio <2.0 and average HER2 copy number <4.0 EQUIVOCAL HER2/CEP17 Ratio <2.0 and average HER2 copy number 4.0 and <6.0 POSITIVE HER2/CEP17 Ratio >=2.0 or <2.0 and average HER2 copy number >=6.0 Enid Cutter MD Pathologist, Electronic Signature ( Signed  04/30/2016)    RADIOGRAPHIC STUDIES: I have personally reviewed the radiological images as listed and agreed with the findings in the report.   MRI Breast Bilateral 09/17/16 IMPRESSION: Smaller left breast mass, now measuring 2.5 cm. No new or suspicious enhancement in either breast. RECOMMENDATION: Treatment plan.  MRI Abdomen W WO Contrast 08/16/16 IMPRESSION: Tiny sub-cm hepatic cysts. No evidence of metastatic disease or other acute findings. Tiny benign left adrenal adenoma and right adrenal myelolipoma.  Mammogram 08/09/16 IMPRESSION:  The 3.1 cm x 2 cm x 1.5 cm irregular equal density mass in the left breast is consistent with the known carcinoma showing mammographic evidence of preoperative chemotherapy response.   [place] of left breast 08/09/16 IMPRESSION:  The 2.7 cm lobulated mass in the left breast is a known biopsy positive for malignancy. Surgical and oncology consult in progress.   See onc history CT ABD/Pelvi 5/10/18s W CONTRAST 06/07/16 IMPRESSION: 1. Wall thickening of the descending and sigmoid colon consistent with an infectious or inflammatory colitis. 2. There is also a short segment of narrowing and possible wall thickening of the superior ascending colon which could reflect a constricting mass or, more likely, be an area of persistent colonic spasm. This could be further assessed with either colonoscopy or a barium enema after the current symptoms of colitis have resolved. 3. Small, subcentimeter, low-density liver lesions which may all be benign. However, 3 these are not evident on prior CT. Liver metastatic disease possible. These could be further assessed with liver MRI with and without contrast. 4. Adrenal lesions which are stable, on the right and myelolipoma and on the left most likely an adenoma.  CT Angio Chest PE W and/or wo Contrast 05/13/16 IMPRESSION: No evidence of pulmonary emboli.  Echo 05/09/16 Study Conclusions -LF EF: 55-60% - Left  ventricle: The cavity size was normal. There was mild focal   basal hypertrophy of the septum. Indeterminant diastolic   function. Systolic function was normal. The estimated ejection   fraction was in the range of 55% to 60%. Wall motion was normal;   there were no regional wall motion abnormalities. GLS abnormal at   -13.3%, poor images however.  ASSESSMENT & PLAN: 56 y.o. post-menopausal Caucasian female with a self palpated left breast mass.  1. Breast cancer of upper-outer quadrant of left breast, invasive ductal carcinoma,  stage IIA (cT2N0M0) grade 3, ER-, PR-, HER2 amplified -We previously reviewed the patient's imaging and pathology. -We previously reviewed her staging and biology of her breast cancer  -We previously discussed that surgical resection is the definitive treatment for breast cancer, she was seen by breast surgeon Dr. Lucia Dickson today, lumpectomy versus mastectomy were discussed with patient. -We previously discussed HER2 positive breast cancers total approximately 15% of breast cancers and happens to be more aggressive than HER2 negative cancers, especially ER and PR negative disease, she has high likelihood of cancer recurrence after complete surgical resection.  -I recommend neoadjuvant chemotherapy TCHP (docetaxel, carboplatin, Herceptin and pejeta)  every 3 weeks for 6 cycles, followed by maintenance Herceptin and pejeta to complete 1 year therapy -Her baseline echo was normal, she was seen by cardiologist Dr. Benjamine Mola -She unfortunately developed severe diarrhea and cytopenia after chemotherapy, and was hospitalized for several times.  -Chemotherapy was subsequently suggested and dose reduced, and stopped after 4 months treatment. -She underwent left breast lumpectomy on 10/09/16 and the pathology results show significant residual disease. -she will start adjuant radiation since her removed lymph node was positive.  -Due to her residual cancer and positive lymph node I also  recommend adjuvnat chemo pill Xeloda twice daily 2 weeks on and 1 week off for 6 months. I discussed the side effects in great detail. I will start her on low dose Xeloda (4 in the am and 3 in the pm) during radiation followed by full dose Xeloda (4 in the am and 4 in the pm).  -She will start Xeloda 10/15 a week before radiation.  -Due to her significant residual disease after neoadjuvant chemotherapy, I recommend her to continue Herceptin and pejeta maintenance therapy, to complete a total of one year therapy.  -Labs reviewed and her potassium is down to 3.0. I recommend she increases her potassium pill BID for 5 days then back to once daily and continue magnesium daily.  -f/u 10/29 and f/u every 2-3 weeks afterward     2. Genetics -Given her strong family history of breast cancer, we recommend her to see genetic counseling to ruled out inheritable breast cancer syndrome. She agreed. -Genetic counseling scheduled but she has not been seen, will refer her again   3. Type 2 Diabetes mellitus, HTN -Managed by her PCP. -The patient has peripheral neuropathy in her feet from her diabetes. The patient is already on Neurontin with 200 mg at night. We previously discussed that chemotherapy may make her neuropathy worse. -Steroids will be given to reduce chemo side effects and I will reduce dexa to 81m daily to not affect her blood sugar much. -We'll monitor her blood glucose and blood pressure closely during her chemotherapy treatment.  4. Ulcerative colitis -We previously discussed that chemotherapy would cause diarrhea and the patient's colitis may exacerbate during chemo  -She is not taking steroids for this. She is on mesalamine, will follow up with Dr. SFuller Plan -She had severe diarrhea after first cycle chemotherapy, was treated for ulcerative colitis flare -I previously advised the patient to keep herself adequatly hydrated and to take Imodium PRN. - I again previously advised the patient to take  Lomotil and Imodium frequently as needed. -Since change in chemo her diarrhea has resolved.     5. Arthritis  -s/p b/l hip replacement, last surgery in February 2018.   6. Peripheral Neuropathy, secondary to chemotherapy -primarily typing and her neuropathy is worse following bouts of typing. I encouraged her to use her left hand more often and begin wearing cotton gloves.  -Encouraged taking B12 multivitamin to aid with this as well.  -She is currently on Gabapentin for her baseline neuropathy associated w/ her DM, she currently takes this TID.  -She also began vitamin B12 in combination with her Gabapentin. She also reports that she has been taking all three tablets of Gabapentin at night to aid with sleep. This has been helping her with sleep, but her neuropathy has has still persisted throughout the day.  PLAN -Order Xeloda today, start 10/15, one week before radiation. She will start with 20033mam, 150067mm, and continue through radiation. Dose  will increase to 2057m q12h, 2 weeks on and 1 week off, after radiation  -Labs reviewed; Potassium is 3.0, she will increase potassium supplement to BID for next 5 days then back to once daily. -we'll proceed with Herceptin + Perjeta maintenance therapy today and continue every 3 weeks -Lab, flush, f/u and herceptin and perjeta in 3 weeks    No orders of the defined types were placed in this encounter.  All questions were answered. The patient knows to call the clinic with any problems, questions or concerns.  I spent 25 minutes counseling the patient face to face. The total time spent in the appointment was 30 minutes and more than 50% was on counseling.  This document serves as a record of services personally performed by YTruitt Merle MD. It was created on her behalf by AJoslyn Devon a trained medical scribe. The creation of this record is based on the scribe's personal observations and the provider's statements to them. This document has  been checked and approved by the attending provider.    FTruitt Merle MD 10/26/2016

## 2016-10-26 ENCOUNTER — Ambulatory Visit (HOSPITAL_BASED_OUTPATIENT_CLINIC_OR_DEPARTMENT_OTHER): Payer: 59

## 2016-10-26 ENCOUNTER — Ambulatory Visit: Payer: 59

## 2016-10-26 ENCOUNTER — Other Ambulatory Visit (HOSPITAL_BASED_OUTPATIENT_CLINIC_OR_DEPARTMENT_OTHER): Payer: 59

## 2016-10-26 ENCOUNTER — Ambulatory Visit (HOSPITAL_BASED_OUTPATIENT_CLINIC_OR_DEPARTMENT_OTHER): Payer: 59 | Admitting: Hematology

## 2016-10-26 ENCOUNTER — Encounter: Payer: Self-pay | Admitting: Hematology

## 2016-10-26 VITALS — BP 130/77 | HR 82 | Temp 98.6°F | Resp 18 | Ht 64.5 in | Wt 224.6 lb

## 2016-10-26 VITALS — BP 123/67 | HR 73 | Temp 98.4°F | Resp 18

## 2016-10-26 DIAGNOSIS — Z5112 Encounter for antineoplastic immunotherapy: Secondary | ICD-10-CM | POA: Diagnosis not present

## 2016-10-26 DIAGNOSIS — I10 Essential (primary) hypertension: Secondary | ICD-10-CM | POA: Diagnosis not present

## 2016-10-26 DIAGNOSIS — Z171 Estrogen receptor negative status [ER-]: Principal | ICD-10-CM

## 2016-10-26 DIAGNOSIS — C50412 Malignant neoplasm of upper-outer quadrant of left female breast: Secondary | ICD-10-CM

## 2016-10-26 DIAGNOSIS — G620 Drug-induced polyneuropathy: Secondary | ICD-10-CM

## 2016-10-26 DIAGNOSIS — E119 Type 2 diabetes mellitus without complications: Secondary | ICD-10-CM

## 2016-10-26 DIAGNOSIS — Z95828 Presence of other vascular implants and grafts: Secondary | ICD-10-CM

## 2016-10-26 LAB — CBC WITH DIFFERENTIAL/PLATELET
BASO%: 0.2 % (ref 0.0–2.0)
Basophils Absolute: 0 10*3/uL (ref 0.0–0.1)
EOS ABS: 0.1 10*3/uL (ref 0.0–0.5)
EOS%: 1.9 % (ref 0.0–7.0)
HCT: 37.2 % (ref 34.8–46.6)
HGB: 11.8 g/dL (ref 11.6–15.9)
LYMPH%: 41 % (ref 14.0–49.7)
MCH: 30.3 pg (ref 25.1–34.0)
MCHC: 31.7 g/dL (ref 31.5–36.0)
MCV: 95.4 fL (ref 79.5–101.0)
MONO#: 0.3 10*3/uL (ref 0.1–0.9)
MONO%: 5.8 % (ref 0.0–14.0)
NEUT%: 51.1 % (ref 38.4–76.8)
NEUTROS ABS: 2.8 10*3/uL (ref 1.5–6.5)
Platelets: 208 10*3/uL (ref 145–400)
RBC: 3.9 10*6/uL (ref 3.70–5.45)
RDW: 13.5 % (ref 11.2–14.5)
WBC: 5.4 10*3/uL (ref 3.9–10.3)
lymph#: 2.2 10*3/uL (ref 0.9–3.3)

## 2016-10-26 LAB — COMPREHENSIVE METABOLIC PANEL
ALBUMIN: 3.2 g/dL — AB (ref 3.5–5.0)
ALK PHOS: 84 U/L (ref 40–150)
ALT: 15 U/L (ref 0–55)
AST: 17 U/L (ref 5–34)
Anion Gap: 11 mEq/L (ref 3–11)
BILIRUBIN TOTAL: 0.39 mg/dL (ref 0.20–1.20)
BUN: 18.7 mg/dL (ref 7.0–26.0)
CO2: 27 meq/L (ref 22–29)
CREATININE: 0.8 mg/dL (ref 0.6–1.1)
Calcium: 9.2 mg/dL (ref 8.4–10.4)
Chloride: 105 mEq/L (ref 98–109)
EGFR: 77 mL/min/{1.73_m2} — AB (ref >90)
GLUCOSE: 185 mg/dL — AB (ref 70–140)
Potassium: 3 mEq/L — CL (ref 3.5–5.1)
SODIUM: 143 meq/L (ref 136–145)
TOTAL PROTEIN: 6.4 g/dL (ref 6.4–8.3)

## 2016-10-26 MED ORDER — SODIUM CHLORIDE 0.9% FLUSH
10.0000 mL | Freq: Once | INTRAVENOUS | Status: AC
  Administered 2016-10-26: 10 mL
  Filled 2016-10-26: qty 10

## 2016-10-26 MED ORDER — PERTUZUMAB CHEMO INJECTION 420 MG/14ML
840.0000 mg | Freq: Once | INTRAVENOUS | Status: AC
  Administered 2016-10-26: 840 mg via INTRAVENOUS
  Filled 2016-10-26: qty 28

## 2016-10-26 MED ORDER — ACETAMINOPHEN 325 MG PO TABS
ORAL_TABLET | ORAL | Status: AC
  Filled 2016-10-26: qty 2

## 2016-10-26 MED ORDER — SODIUM CHLORIDE 0.9% FLUSH
10.0000 mL | INTRAVENOUS | Status: DC | PRN
  Administered 2016-10-26: 10 mL
  Filled 2016-10-26: qty 10

## 2016-10-26 MED ORDER — CAPECITABINE 500 MG PO TABS: ORAL_TABLET | ORAL | 1 refills | Status: DC

## 2016-10-26 MED ORDER — ACETAMINOPHEN 325 MG PO TABS
650.0000 mg | ORAL_TABLET | Freq: Once | ORAL | Status: AC
Start: 2016-10-26 — End: 2016-10-26
  Administered 2016-10-26: 650 mg via ORAL

## 2016-10-26 MED ORDER — SODIUM CHLORIDE 0.9 % IV SOLN
600.0000 mg | Freq: Once | INTRAVENOUS | Status: AC
  Administered 2016-10-26: 600 mg via INTRAVENOUS
  Filled 2016-10-26: qty 28.57

## 2016-10-26 MED ORDER — HEPARIN SOD (PORK) LOCK FLUSH 100 UNIT/ML IV SOLN
500.0000 [IU] | Freq: Once | INTRAVENOUS | Status: AC | PRN
  Administered 2016-10-26: 500 [IU]
  Filled 2016-10-26: qty 5

## 2016-10-26 MED ORDER — SODIUM CHLORIDE 0.9 % IV SOLN
Freq: Once | INTRAVENOUS | Status: AC
  Administered 2016-10-26: 14:00:00 via INTRAVENOUS

## 2016-10-26 NOTE — Progress Notes (Signed)
Offered pt to receive Flu injection today.  Pt responded  " NO, I  Never take Flu shot ".  [name] notified.

## 2016-10-26 NOTE — Patient Instructions (Signed)
[facility] Discharge Instructions for Patients Receiving Chemotherapy  Today you received the following chemotherapy agents: Herceptin and Perjeta. If you develop diarrhea, take Imodium per package instructions. Call the office for persistent diarrhea.   If you develop nausea and vomiting that is not controlled by your nausea medication, call the clinic.   BELOW ARE SYMPTOMS THAT SHOULD BE REPORTED IMMEDIATELY:  *FEVER GREATER THAN 100.5 F  *CHILLS WITH OR WITHOUT FEVER  NAUSEA AND VOMITING THAT IS NOT CONTROLLED WITH YOUR NAUSEA MEDICATION  *UNUSUAL SHORTNESS OF BREATH  *UNUSUAL BRUISING OR BLEEDING  TENDERNESS IN MOUTH AND THROAT WITH OR WITHOUT PRESENCE OF ULCERS  *URINARY PROBLEMS  *BOWEL PROBLEMS  UNUSUAL RASH Items with * indicate a potential emergency and should be followed up as soon as possible.  Feel free to call the clinic should you have any questions or concerns. The clinic phone number is [phone].  Please show the [facility] at check-in to the Emergency Department and triage nurse.

## 2016-10-29 ENCOUNTER — Telehealth: Payer: Self-pay | Admitting: Hematology

## 2016-10-29 NOTE — Telephone Encounter (Signed)
Scheduled appt per 9/28 los - patient is aware of appt date and time.

## 2016-11-01 ENCOUNTER — Other Ambulatory Visit: Payer: Self-pay | Admitting: Pharmacist

## 2016-11-01 ENCOUNTER — Telehealth: Payer: Self-pay | Admitting: Pharmacist

## 2016-11-01 DIAGNOSIS — C50412 Malignant neoplasm of upper-outer quadrant of left female breast: Secondary | ICD-10-CM

## 2016-11-01 MED ORDER — CAPECITABINE 500 MG PO TABS: ORAL_TABLET | ORAL | 0 refills | Status: DC

## 2016-11-01 NOTE — Telephone Encounter (Signed)
Oral Oncology Pharmacist Encounter  Received new prescription for capecitabine for the treatment of Breast Cancer, stage IIA, ER/PR negative, HER-2 positive. She remains on trastuzumab/pertuzumab to complete a year of adjuvant therapy. Planned duration of capecitabine is 6 months. She will also be getting radiation likely for 6.5 weeks.   Labs from 10/26/16 assessed, okay for treatment.  Called patient on 11/01/16, and we discussed her new medication, capecitabine.   She will be taking capecitabine 2040m (4 tabs) PO every morning and 15044m(3 tabs) every evening with water within 30 minutes of a meal. She will take this Mon-Fri on days she has radiation.   Start date: pending radiation schedule. Recommended patient to call the clinic when she receives medication to confirm her start date.   Current medication list in Epic reviewed, and no serious DDIs noted. Capecitabine has a possible risk of QTc prolongation, so keeping electrolytes stable will be important in setting of possible anti-emetic use and diarrhea.   Discussed side effects including but not limited to: nausea, diarrhea (she understood she could use loperamide but to call if she is using it a lot or it does not control her diarrhea), mouth sores, hand-and-foot syndrome, and decreased blood counts.   She understood that this is an oral chemotherapy, and to wash her hands after handling the medication and to keep it away from pets, family members, etc.   [place]. She should be getting a call from them soon.   Oral Oncology Clinic will continue to follow. Ms. BeFoulkesnderstood to call with any questions or concerns that may come up.   MaDemetrius CharityPharmD PGY2 Oncology Pharmacy Resident  Pharmacy Phone: [phone]/04/2016

## 2016-11-04 ENCOUNTER — Other Ambulatory Visit: Payer: Self-pay | Admitting: Hematology

## 2016-11-07 ENCOUNTER — Other Ambulatory Visit: Payer: Self-pay | Admitting: *Deleted

## 2016-11-07 MED ORDER — POTASSIUM CHLORIDE CRYS ER 20 MEQ PO TBCR: 20.0000 meq | EXTENDED_RELEASE_TABLET | Freq: Every day | ORAL | 1 refills | Status: DC

## 2016-11-13 ENCOUNTER — Ambulatory Visit: Payer: 59 | Admitting: Radiation Oncology

## 2016-11-14 ENCOUNTER — Ambulatory Visit
Admission: RE | Admit: 2016-11-14 | Discharge: 2016-11-14 | Disposition: A | Discharge status: Home / Self Care | Payer: 59 | Source: Ambulatory Visit | Attending: Radiation Oncology | Admitting: Radiation Oncology

## 2016-11-14 DIAGNOSIS — Z510 Encounter for antineoplastic radiation therapy: Secondary | ICD-10-CM | POA: Diagnosis not present

## 2016-11-14 DIAGNOSIS — C50412 Malignant neoplasm of upper-outer quadrant of left female breast: Secondary | ICD-10-CM | POA: Diagnosis not present

## 2016-11-14 DIAGNOSIS — E119 Type 2 diabetes mellitus without complications: Secondary | ICD-10-CM | POA: Diagnosis not present

## 2016-11-15 NOTE — Progress Notes (Signed)
  Radiation Oncology         [phone]) [phone] ________________________________  Name: Brandi Dickson MRN: [id number]  Date: 11/14/2016  DOB: Sep 20, 1960  SIMULATION AND TREATMENT PLANNING NOTE    ICD-10-CM   1. Malignant neoplasm of upper-outer quadrant of left female breast, unspecified estrogen receptor status ([facility]) C50.412     DIAGNOSIS:  The encounter diagnosis was Malignant neoplasm of upper-outer quadrant of left breast in female [facility]). Stage IIA (ypT2, ypN1a)   NARRATIVE:  The patient was brought to the [facility].  Identity was confirmed.  All relevant records and images related to the planned course of therapy were reviewed.  The patient freely provided informed written consent to proceed with treatment after reviewing the details related to the planned course of therapy. The consent form was witnessed and verified by the simulation staff.  Then, the patient was set-up in a stable reproducible  supine position for radiation therapy.  CT images were obtained.  Surface markings were placed.  The CT images were loaded into the planning software.  Then the target and avoidance structures were contoured.  Treatment planning then occurred.  The radiation prescription was entered and confirmed.  Then, I designed and supervised the construction of a total of 5 medically necessary complex treatment devices.  I have requested : 3D Simulation  I have requested a DVH of the following structures: heart, lungs, lumpectomy cavity.  I have ordered:dose calc.  PLAN:  The patient will receive 50.4 Gy in 28 fractions directed at the left breast. The axillary region will receive 45 gray in 25 fractions. A 4 field set up will be used for treatment. Patient will then proceed with a boost to the lumpectomy cavity of 5 fractions at 2 gray per fraction for a cumulative dose to the lumpectomy cavity of 60.4 gray  -----------------------------------  Blair Promise, PhD, MD

## 2016-11-16 ENCOUNTER — Ambulatory Visit (HOSPITAL_BASED_OUTPATIENT_CLINIC_OR_DEPARTMENT_OTHER): Payer: 59 | Admitting: Nurse Practitioner

## 2016-11-16 ENCOUNTER — Ambulatory Visit (HOSPITAL_BASED_OUTPATIENT_CLINIC_OR_DEPARTMENT_OTHER): Payer: 59

## 2016-11-16 ENCOUNTER — Ambulatory Visit: Payer: 59

## 2016-11-16 ENCOUNTER — Telehealth: Payer: Self-pay | Admitting: Nurse Practitioner

## 2016-11-16 ENCOUNTER — Other Ambulatory Visit (HOSPITAL_BASED_OUTPATIENT_CLINIC_OR_DEPARTMENT_OTHER): Payer: 59

## 2016-11-16 VITALS — BP 135/72 | HR 74 | Temp 98.2°F | Resp 18 | Ht 64.5 in | Wt 224.6 lb

## 2016-11-16 DIAGNOSIS — Z171 Estrogen receptor negative status [ER-]: Principal | ICD-10-CM

## 2016-11-16 DIAGNOSIS — C50412 Malignant neoplasm of upper-outer quadrant of left female breast: Secondary | ICD-10-CM

## 2016-11-16 DIAGNOSIS — G629 Polyneuropathy, unspecified: Secondary | ICD-10-CM

## 2016-11-16 DIAGNOSIS — I10 Essential (primary) hypertension: Secondary | ICD-10-CM | POA: Diagnosis not present

## 2016-11-16 DIAGNOSIS — Z5112 Encounter for antineoplastic immunotherapy: Secondary | ICD-10-CM | POA: Diagnosis not present

## 2016-11-16 DIAGNOSIS — E876 Hypokalemia: Secondary | ICD-10-CM

## 2016-11-16 DIAGNOSIS — Z95828 Presence of other vascular implants and grafts: Secondary | ICD-10-CM

## 2016-11-16 DIAGNOSIS — E119 Type 2 diabetes mellitus without complications: Secondary | ICD-10-CM | POA: Diagnosis not present

## 2016-11-16 LAB — CBC WITH DIFFERENTIAL/PLATELET
BASO%: 0.6 % (ref 0.0–2.0)
Basophils Absolute: 0 10*3/uL (ref 0.0–0.1)
EOS ABS: 0.1 10*3/uL (ref 0.0–0.5)
EOS%: 0.9 % (ref 0.0–7.0)
HEMATOCRIT: 38.9 % (ref 34.8–46.6)
HEMOGLOBIN: 12.5 g/dL (ref 11.6–15.9)
LYMPH%: 31.8 % (ref 14.0–49.7)
MCH: 29.1 pg (ref 25.1–34.0)
MCHC: 32 g/dL (ref 31.5–36.0)
MCV: 90.8 fL (ref 79.5–101.0)
MONO#: 0.3 10*3/uL (ref 0.1–0.9)
MONO%: 4.4 % (ref 0.0–14.0)
NEUT#: 3.6 10*3/uL (ref 1.5–6.5)
NEUT%: 62.3 % (ref 38.4–76.8)
Platelets: 219 10*3/uL (ref 145–400)
RBC: 4.29 10*6/uL (ref 3.70–5.45)
RDW: 13.8 % (ref 11.2–14.5)
WBC: 5.7 10*3/uL (ref 3.9–10.3)
lymph#: 1.8 10*3/uL (ref 0.9–3.3)

## 2016-11-16 LAB — COMPREHENSIVE METABOLIC PANEL
ALBUMIN: 3.3 g/dL — AB (ref 3.5–5.0)
ALK PHOS: 77 U/L (ref 40–150)
ALT: 16 U/L (ref 0–55)
ANION GAP: 12 meq/L — AB (ref 3–11)
AST: 20 U/L (ref 5–34)
BUN: 17.3 mg/dL (ref 7.0–26.0)
CALCIUM: 9 mg/dL (ref 8.4–10.4)
CO2: 25 mEq/L (ref 22–29)
CREATININE: 0.8 mg/dL (ref 0.6–1.1)
Chloride: 104 mEq/L (ref 98–109)
EGFR: >60 mL/min/{1.73_m2} (ref >60)
Glucose: 111 mg/dl (ref 70–140)
POTASSIUM: 3.4 meq/L — AB (ref 3.5–5.1)
Sodium: 141 mEq/L (ref 136–145)
Total Bilirubin: 0.45 mg/dL (ref 0.20–1.20)
Total Protein: 6.6 g/dL (ref 6.4–8.3)

## 2016-11-16 MED ORDER — TRASTUZUMAB CHEMO 150 MG IV SOLR
600.0000 mg | Freq: Once | INTRAVENOUS | Status: AC
  Administered 2016-11-16: 600 mg via INTRAVENOUS
  Filled 2016-11-16: qty 28.57

## 2016-11-16 MED ORDER — HEPARIN SOD (PORK) LOCK FLUSH 100 UNIT/ML IV SOLN
500.0000 [IU] | Freq: Once | INTRAVENOUS | Status: AC | PRN
  Administered 2016-11-16: 500 [IU]
  Filled 2016-11-16: qty 5

## 2016-11-16 MED ORDER — SODIUM CHLORIDE 0.9% FLUSH
10.0000 mL | INTRAVENOUS | Status: DC | PRN
  Administered 2016-11-16: 10 mL
  Filled 2016-11-16: qty 10

## 2016-11-16 MED ORDER — ACETAMINOPHEN 325 MG PO TABS
650.0000 mg | ORAL_TABLET | Freq: Once | ORAL | Status: AC
  Administered 2016-11-16: 650 mg via ORAL

## 2016-11-16 MED ORDER — SODIUM CHLORIDE 0.9 % IV SOLN
420.0000 mg | Freq: Once | INTRAVENOUS | Status: AC
  Administered 2016-11-16: 420 mg via INTRAVENOUS
  Filled 2016-11-16: qty 14

## 2016-11-16 MED ORDER — SODIUM CHLORIDE 0.9 % IV SOLN
Freq: Once | INTRAVENOUS | Status: AC
  Administered 2016-11-16: 15:00:00 via INTRAVENOUS

## 2016-11-16 MED ORDER — ACETAMINOPHEN 325 MG PO TABS
ORAL_TABLET | ORAL | Status: AC
  Filled 2016-11-16: qty 2

## 2016-11-16 MED ORDER — SODIUM CHLORIDE 0.9% FLUSH
10.0000 mL | Freq: Once | INTRAVENOUS | Status: AC
  Administered 2016-11-16: 10 mL
  Filled 2016-11-16: qty 10

## 2016-11-16 NOTE — Patient Instructions (Signed)
[facility] Discharge Instructions for Patients Receiving Chemotherapy  Today you received the following chemotherapy agents: Herceptin and Perjeta. If you develop diarrhea, take Imodium per package instructions. Call the office for persistent diarrhea.   If you develop nausea and vomiting that is not controlled by your nausea medication, call the clinic.   BELOW ARE SYMPTOMS THAT SHOULD BE REPORTED IMMEDIATELY:  *FEVER GREATER THAN 100.5 F  *CHILLS WITH OR WITHOUT FEVER  NAUSEA AND VOMITING THAT IS NOT CONTROLLED WITH YOUR NAUSEA MEDICATION  *UNUSUAL SHORTNESS OF BREATH  *UNUSUAL BRUISING OR BLEEDING  TENDERNESS IN MOUTH AND THROAT WITH OR WITHOUT PRESENCE OF ULCERS  *URINARY PROBLEMS  *BOWEL PROBLEMS  UNUSUAL RASH Items with * indicate a potential emergency and should be followed up as soon as possible.  Feel free to call the clinic should you have any questions or concerns. The clinic phone number is [phone].  Please show the [facility] at check-in to the Emergency Department and triage nurse.

## 2016-11-16 NOTE — Patient Instructions (Signed)

## 2016-11-16 NOTE — Telephone Encounter (Signed)
Scheduled appt per 10/19 los - Gave patient AVS and calender per los.  

## 2016-11-16 NOTE — Progress Notes (Signed)
[facility]  [phone] [phone]  Clinic Follow up Note   Patient Care Team: Orpah Melter, MD as PCP - General (Family Medicine) [name], MD as Consulting Physician (General Surgery) Truitt Merle, MD as Consulting Physician (Hematology) Gery Pray, MD as Consulting Physician (Radiation Oncology) Ladene Artist, MD as Consulting Physician (Gastroenterology) 11/16/2016  SUMMARY OF ONCOLOGIC HISTORY: Oncology History   Cancer Staging Breast cancer of upper-outer quadrant of left female breast [facility]) Staging form: Breast, AJCC 8th Edition - Clinical stage from 04/25/2016: Stage IIA (cT2, cN0, cM0, G3, ER: Negative, PR: Negative, HER2: Positive) - Signed by Truitt Merle, MD on 05/02/2016 - Pathologic stage from 10/09/2016: No Stage Recommended (ypT2, pN1a, cM0, G3, ER: Negative, PR: Negative, HER2: Positive) - Signed by Truitt Merle, MD on 10/26/2016       Breast cancer of upper-outer quadrant of left female breast ([place])   04/24/2016 Mammogram    Category B breasts with a new irregular mass in the UOQ left breast middle depth. Ultrasound revealed a 3.9 cm mass in the 1:00 position. The left axilla was negative.       04/25/2016 Initial Biopsy    Biopsy of the left breast showed grade 3 invasive ductal carcinoma, DCIS, and lymphovascular invasion was present.      04/25/2016 Receptors her2    ER 0% negative, PR 0% negative, HER2 positive, Ki67 30%      05/02/2016 Initial Diagnosis    Breast cancer of upper-outer quadrant of left female breast ([place])     05/07/2016 Imaging    MRI of the bilateral breast 05/07/16 IMPRESSION: Lobulated enhancing mass (4.0 x 2.7 x 3.1 cm) in the upper-outer quadrant of the left breast corresponding with the recently diagnosed invasive mammary carcinoma. Linear enhancement extends 2.6 cm posterior to the mass worrisome for ductal carcinoma in-situ.      05/09/2016 Echocardiogram    Echo 05/09/16 -LF EF: 55-60%      05/11/2016 - 08/31/2016 Neo-Adjuvant Chemotherapy    Cycle 1 Docetaxel, Carboplatin, Herceptin and pejeta (TCHP), cycle 2 Docetaxel changed to Taxol, carbo dose reduced to AUC 4.5 (from 5) due to severe diarrhea and cytopenia, Perjeta held after cycle 2.   Due to poor toleration, Brandi Dickson was held after cycle 4 and Taxol changed to weekly X4. Herceptin was continued during the above chemo.        05/13/2016 Imaging    CT Angio Chest PE IMPRESSION: No evidence of pulmonary emboli. Left breast mass consistent with the given clinical history. Stable left adrenal lesion likely representing a small adenoma.      05/18/2016 - 05/24/2016 Hospital Admission    Patient presented with nausea, vomiting, and diarrhea; admitted to hospital with Hyponatremia      06/07/2016 - 06/09/2016 Hospital Admission    Patient presents to hospital complaints of rectal bleeding and abdominal cramps when defacating      06/07/2016 Imaging    CT ABDOMEN PELVIS W CONTRAST  IMPRESSION: 1. Wall thickening of the descending and sigmoid colon consistent with an infectious or inflammatory colitis. 2. There is also a short segment of narrowing and possible wall thickening of the superior ascending colon which could reflect a constricting mass or, more likely, be an area of persistent colonic spasm. This could be further assessed with either colonoscopy or a barium enema after the current symptoms of colitis have resolved. 3. Small, subcentimeter, low-density liver lesions which may all be benign. However, 3 these are not evident on prior  CT. Liver metastatic disease possible. These could be further assessed with liver MRI with and without contrast. 4. Adrenal lesions which are stable, on the right and myelolipoma and on the left most likely an adenoma.      06/07/2016 Imaging    CT A/P IMPRESSION: 1. Wall thickening of the descending and sigmoid colon consistent with an infectious or inflammatory colitis. 2. There  is also a short segment of narrowing and possible wall thickening of the superior ascending colon which could reflect a constricting mass or, more likely, be an area of persistent colonic spasm. This could be further assessed with either colonoscopy or a barium enema after the current symptoms of colitis have resolved. 3. Small, subcentimeter, low-density liver lesions which may all be benign. However, 3 these are not evident on prior CT. Liver metastatic disease possible. These could be further assessed with liver MRI with and without contrast. 4. Adrenal lesions which are stable, on the right and myelolipoma and on the left most likely an adenoma.      06/07/2016 - 06/09/2016 Hospital Admission    Diarrhea and rectal Bleeding      07/19/2016 - 07/20/2016 Hospital Admission    Diarrhea and dehydration      08/09/2016 Mammogram    Mammogram 08/09/16 IMPRESSION:  The 3.1 cm x 2 cm x 1.5 cm irregular equal density mass in the left breast is consistent with the known carcinoma showing mammographic evidence of preoperative chemotherapy response.         08/09/2016 Imaging    [place] of left breast 08/09/16 IMPRESSION:  The 2.7 cm lobulated mass in the left breast is a known biopsy positive for malignancy. Surgical and oncology consult in progress.      08/16/2016 Imaging    MRI Abdomen W WO Contrast IMPRESSION: Tiny sub-cm hepatic cysts. No evidence of metastatic disease or other acute findings. Tiny benign left adrenal adenoma and right adrenal myelolipoma.      09/14/2016 -  Chemotherapy    Maintenance Herceptin every 3 weeks.      09/17/2016 Imaging    MRI Breast Bilateral 09/17/16 IMPRESSION: Smaller left breast mass, now measuring 2.5 cm. No new or suspicious enhancement in either breast.  RECOMMENDATION: Treatment plan.      10/09/2016 Surgery    LEFT BREAST LUMPECTOMY WITH RADIOACTIVE SEED AND L4EFT AXILLARY SENTINEL LYMPH NODE BIOPSY by Dr. Lucia Gaskins on 10/09/16       10/09/2016 Pathology Results    Diagnosis 10/09/16 1. Breast, lumpectomy, Left - INVASIVE DUCTAL CARCINOMA, GRADE 3, SPANNING 2.4 CM. - HIGH GRADE DUCTAL CARCINOMA IN SITU WITH NECROSIS. - RESECTION MARGINS ARE NEGATIVE FOR CARCINOMA. - BIOPSY SITE. - SEE ONCOLOGY TABLE. 2. Breast, excision, Left additional medial margin - FIBROCYSTIC CHANGE. - NO MALIGNANCY IDENTIFIED. 3. Lymph node, sentinel, biopsy, Left Axillary - METASTATIC CARCINOMA IN ONE OF ONE LYMPH NODES (1/1).       CURRENT THERAPY:  1. Maintenance Herceptin every 3 weeks, started on 06/01/16. Add Perjeta back starting 10/26/16. 2. Xeloda to start 10/22. With radiation she will be on low dose 4 tablets in the am and 3 tablets in the pm. Without radiation she will be on full dose 4 tablets in the am and 4 tablets in the pm.  3. Pending left breast radiation to start 11/22/16   INTERVAL HISTORY: Brandi Dickson returns for follow up as scheduled. She denies changes in her health from last visit. She has mild fatigue but continues normal activities and work. She  has ankle swelling that fluctuates throughout the day, slightly improves with movement and elevation. She sits at a desk for long hours. Denies calf tenderness. She has worsening neuropathy to her right hand and feet bilaterally, related to DM and present 3-4 years prior to breast cancer treatment. Does not impair function but typing is occasionally difficult and worse at night. Intermittent knee and shoulder pain with known arthritis in other joints, does not take medication. She has a headache today but hasn't eaten in over 6 hours. Xeloda has been delivered to her but has not started. She is due to begin radiation next week.   REVIEW OF SYSTEMS:   Constitutional: Denies fevers, chills or abnormal weight loss Eyes: Denies blurriness of vision Ears, nose, mouth, throat, and face: Denies mucositis or sore throat Respiratory: Denies cough, dyspnea or wheezes Cardiovascular:  Denies palpitation, chest discomfort (+) lower extremity swelling, denies calf tenderness Gastrointestinal:  Denies nausea, vomiting, constipation, heartburn or change in bowel habits (+) intermittent mild diarrhea, no blood in stool  Skin: Denies abnormal skin rashes Lymphatics: Denies new lymphadenopathy or easy bruising Neurological:Denies new weaknesses (+) worsening neuropathy to right hand and feet bilaterally, worse at night, typing occasionally difficult. (+) headache today, no food in >6 hours Behavioral/Psych: Mood is stable, no new changes  All other systems were reviewed with the patient and are negative.  MEDICAL HISTORY:  Past Medical History:  Diagnosis Date  . Arthritis   . Asthma    triggered with [name] perfumes and cigarette smoke  . Cancer ([place])   . Colitis   . Diabetes mellitus without complication ([address])   . Hypertension   . Neuropathy     SURGICAL HISTORY: Past Surgical History:  Procedure Laterality Date  . ABDOMINAL HYSTERECTOMY    . BREAST LUMPECTOMY WITH RADIOACTIVE SEED AND SENTINEL LYMPH NODE BIOPSY Left 10/09/2016   Procedure: LEFT BREAST LUMPECTOMY WITH RADIOACTIVE SEED AND L4EFT AXILLARY SENTINEL LYMPH NODE BIOPSY;  Surgeon: [name], MD;  Location: [facility];  Service: General;  Laterality: Left;  . DILATION AND CURETTAGE OF UTERUS    . KNEE ARTHROSCOPY Left   . PORTACATH PLACEMENT Right 05/08/2016   Procedure: INSERTION PORT-A-CATH WITH [place];  Surgeon: [name], MD;  Location: [facility];  Service: General;  Laterality: Right;  . TONSILLECTOMY    . TOTAL HIP ARTHROPLASTY Right   . TOTAL HIP ARTHROPLASTY Left 03/06/2016   Procedure: LEFT TOTAL HIP ARTHROPLASTY ANTERIOR APPROACH;  Surgeon: [name], MD;  Location: [facility];  Service: Orthopedics;  Laterality: Left;    I have reviewed the social history and family history with the patient and they are unchanged from previous note.  ALLERGIES:  is allergic  to lisinopril; augmentin [amoxicillin-pot clavulanate]; benadryl [diphenhydramine]; and losartan potassium.  MEDICATIONS:  Current Outpatient Prescriptions  Medication Sig Dispense Refill  . albuterol (PROVENTIL HFA;VENTOLIN HFA) 108 (90 Base) MCG/ACT inhaler Inhale 1-2 puffs into the lungs every 6 (six) hours as needed for wheezing or shortness of breath.    . capecitabine (XELODA) 500 MG tablet Take 4 tabs (2054m) by mouth in AM & 3 tabs (15032m in PM, on days of radiation only (Mon-Fri). Take with water a after meal. 228 tablet 0  . diazepam (VALIUM) 5 MG tablet Take 1 tablet (5 mg total) by mouth as directed. Take 5 mg by mouth 30 minutes prior to MRI procedure.   May repeat x 1. DO NOT Drive after taking Valium as it may cause  drowsiness. 2 tablet 0  . dicyclomine (BENTYL) 20 MG tablet Take 1 tablet (20 mg total) by mouth 4 (four) times daily -  before meals and at bedtime. 30 tablet 0  . diphenoxylate-atropine (LOMOTIL) 2.5-0.025 MG tablet Take one to two tablets every six hours as needed for diarrhea 120 tablet 1  . ferrous sulfate (FERROUSUL) 325 (65 FE) MG tablet Take 1 tablet (325 mg total) by mouth 3 (three) times daily with meals.    . gabapentin (NEURONTIN) 100 MG capsule Take 200 mg by mouth at bedtime.     [name] Kitchen HYDROcodone-acetaminophen (NORCO/VICODIN) 5-325 MG tablet Take 1-2 tablets by mouth every 6 (six) hours as needed for moderate pain. 20 tablet 0  . hydrocortisone (ANUSOL-HC) 25 MG suppository Place 1 suppository (25 mg total) rectally 2 (two) times daily. 12 suppository 0  . lidocaine-prilocaine (EMLA) cream Apply 1 application to skin 1.5 to 2 hrs before use.  Cover to secure cream with plastic wrap. 30 g PRN  . loperamide (IMODIUM) 1 MG/5ML solution Take 3 mg by mouth as needed for diarrhea or loose stools.    [name] Kitchen LORazepam (ATIVAN) 0.5 MG tablet Take 1 tablet (0.5 mg total) by mouth once as needed for anxiety. 2 tablet 0  . magnesium oxide (MAG-OX) 400 MG tablet Take 400 mg  by mouth 2 (two) times daily.    . Mesalamine (ASACOL HD) 800 MG TBEC Take 2 tablets (1,600 mg total) by mouth 3 (three) times daily. 90 tablet 5  . metFORMIN (GLUCOPHAGE-XR) 500 MG 24 hr tablet Take 1,000 mg by mouth at bedtime.    . metoprolol (LOPRESSOR) 50 MG tablet Take 50 mg by mouth at bedtime. Takes 1 in the am and 1 at HS    . niacin 250 MG tablet Take 250 mg by mouth at bedtime.     . ondansetron (ZOFRAN) 8 MG tablet Take 1 tablet (8 mg total) by mouth 2 (two) times daily as needed for refractory nausea / vomiting. Start on day 3 after chemo. 30 tablet 1  . potassium chloride SA (K-DUR,KLOR-CON) 20 MEQ tablet Take 1 tablet (20 mEq total) by mouth daily. 30 tablet 1  . predniSONE (DELTASONE) 20 MG tablet Take 10 mg by mouth daily.     . prochlorperazine (COMPAZINE) 10 MG tablet Take 1 tablet (10 mg total) by mouth every 6 (six) hours as needed (Nausea or vomiting). 30 tablet 1  . Specialty Vitamins Products (VITAMINS FOR THE HAIR) TABS Take 2 tablets by mouth 2 (two) times daily.    . traMADol (ULTRAM) 50 MG tablet Take 1-2 tablets (50-100 mg total) by mouth every 6 (six) hours as needed. 40 tablet 0  . triamterene-hydrochlorothiazide (MAXZIDE) 75-50 MG per tablet Take 0.5 tablets by mouth daily.      No current facility-administered medications for this visit.    Facility-Administered Medications Ordered in Other Visits  Medication Dose Route Frequency Provider Last Rate Last Dose  . heparin lock flush 100 unit/mL  500 Units Intracatheter Once PRN Truitt Merle, MD      . sodium chloride flush (NS) 0.9 % injection 10 mL  10 mL Intracatheter PRN Truitt Merle, MD      . sodium chloride flush (NS) 0.9 % injection 10 mL  10 mL Intracatheter PRN Truitt Merle, MD   10 mL at 08/24/16 1725    PHYSICAL EXAMINATION: ECOG PERFORMANCE STATUS: 1 - Symptomatic but completely ambulatory  Vitals:   11/16/16 1317  BP: 135/72  Pulse: 74  Resp: 18  Temp: 98.2 F (36.8 C)  SpO2: 100%   Filed Weights    11/16/16 1317  Weight: 224 lb 9.6 oz (101.9 kg)    GENERAL:alert, no distress and comfortable SKIN: skin color, texture, turgor are normal, no rashes or significant lesions EYES: normal, Conjunctiva are pink and non-injected, sclera clear OROPHARYNX:no exudate, no erythema and lips, buccal mucosa, and tongue normal  NECK: supple, thyroid normal size, non-tender, without nodularity LYMPH:  no palpable cervical, supraclavicular, or axillary lymphadenopathy LUNGS: clear to auscultation bilaterally with normal breathing effort HEART: regular rate & rhythm, S1 and S2 normal, no murmurs (+)  symmetrical lower extremity edema, no calf tenderness or erythema. ABDOMEN:abdomen soft, round, non-tender and normal bowel sounds. No palpable organomegaly or masses Musculoskeletal:no cyanosis of digits and no clubbing. No joint tenderness or erythema  NEURO: alert & oriented x 3 with fluent speech, no focal motor deficits (+) mildly decreased vibratory sense to right hand and moderately decreased vibratory sense to feet bilaterally per tuning fork exam BREAST: (+) s/p lumpectomy to UOQ of left breast, healing well, no discharge  PAC without erythema.  LABORATORY DATA:  I have reviewed the data as listed CBC Latest Ref Rng & Units 11/16/2016 10/26/2016 10/05/2016  WBC 3.9 - 10.3 10e3/uL 5.7 5.4 6.2  Hemoglobin 11.6 - 15.9 g/dL 12.5 11.8 11.8  Hematocrit 34.8 - 46.6 % 38.9 37.2 36.4  Platelets 145 - 400 10e3/uL 219 208 246     CMP Latest Ref Rng & Units 11/16/2016 10/26/2016 10/05/2016  Glucose 70 - 140 mg/dl 111 185(H) 127  BUN 7.0 - 26.0 mg/dL 17.3 18.7 19.7  Creatinine 0.6 - 1.1 mg/dL 0.8 0.8 0.8  Sodium 136 - 145 mEq/L 141 143 141  Potassium 3.5 - 5.1 mEq/L 3.4(L) 3.0(LL) 3.5  Chloride 101 - 111 mmol/L - - -  CO2 22 - 29 mEq/L '25 27 29  ' Calcium 8.4 - 10.4 mg/dL 9.0 9.2 9.7  Total Protein 6.4 - 8.3 g/dL 6.6 6.4 6.7  Total Bilirubin 0.20 - 1.20 mg/dL 0.45 0.39 0.31  Alkaline Phos 40 - 150 U/L 77 84  79  AST 5 - 34 U/L '20 17 14  ' ALT 0 - 55 U/L '16 15 17    ' RADIOGRAPHIC STUDIES: I have personally reviewed the radiological images as listed and agreed with the findings in the report. No results found.   ASSESSMENT & PLAN: 56 y.o. post-menopausal Caucasian female with a self palpated left breast mass.  1. Breast cancer of upper-outer quadrant of left breast, invasive ductal carcinoma,  stage IIA (cT2N0M0) grade 3, ER-, PR-, HER2 amplified 2. Genetics 3. Type 2 DM, HTN 4. Ulcerative colitis 5. Arthritis 6. Peripheral neuropathy   Brandi Dickson appears stable. Stable joint pain to knees and shoulders with known arthritis in other joints, s/p bilateral hip replacements. I recommend tylenol q6-8 hours PRN, she has history of decreased renal function and she prefers to avoid NSAIDs if possible. I encouraged her to f/u with PCP for arthritic pain management. She has worsening neuropathy with mild to moderate decreased vibratory sense in hands/feet that is worse at night, she takes 200 mg neurontin at night, may increase to 300-400 mg at bedtime. Hypokalemia improved, K 3.4 today; she takes one 20 meq tablet daily, she will take 1 tablet BID x3 days then resume 1 daily as normal. Cmet otherwise unremarkable; CBC WNL; physical exam unremarkable. Stable VS and weight. She will begin xeloda 2000 mg AM (4 tablets) and 1500 mg  PM (3 tablets) M-F on 11/19/16, radiation to begin 11/22/16. We reviewed medication administration and anti-diarrhea regimen. She will continue herceptin/perjeta today and q3 weeks, next echo 11/21/16  PLAN:  -Hypokalemia: Take 20 meq K 1 tablet BID x3 days, then resume normal 1 tablet daily dose -Xeloda: begin 11/22/16 4 tablets AM, 3 tablets PM M-F with radiation -May increase neurontin to 300-400 mg at bedtime for worsening neuropathy -Tylenol PRN for arthritis/joint pain -Radiation therapy to begin 11/21/16 -Continue herceptin/perjeta today and q3 weeks -Next echo  11/21/16 -F/u with [name] 11/2 to monitor xeloda/radiation toxicities and 11/9 prior to herceptin/perjeta  All questions were answered. The patient knows to call the clinic with any problems, questions or concerns. No barriers to learning was detected.     Alla Feeling, NP 11/16/16

## 2016-11-17 ENCOUNTER — Encounter: Payer: Self-pay | Admitting: Nurse Practitioner

## 2016-11-19 DIAGNOSIS — I10 Essential (primary) hypertension: Secondary | ICD-10-CM | POA: Diagnosis not present

## 2016-11-19 DIAGNOSIS — C50412 Malignant neoplasm of upper-outer quadrant of left female breast: Secondary | ICD-10-CM | POA: Diagnosis not present

## 2016-11-19 DIAGNOSIS — Z510 Encounter for antineoplastic radiation therapy: Secondary | ICD-10-CM | POA: Diagnosis not present

## 2016-11-19 DIAGNOSIS — Z171 Estrogen receptor negative status [ER-]: Secondary | ICD-10-CM | POA: Diagnosis not present

## 2016-11-21 ENCOUNTER — Encounter (HOSPITAL_COMMUNITY): Payer: Self-pay | Admitting: Cardiology

## 2016-11-21 ENCOUNTER — Ambulatory Visit
Admission: RE | Admit: 2016-11-21 | Discharge: 2016-11-21 | Disposition: A | Discharge status: Home / Self Care | Payer: 59 | Source: Ambulatory Visit | Attending: Radiation Oncology | Admitting: Radiation Oncology

## 2016-11-21 ENCOUNTER — Ambulatory Visit (HOSPITAL_BASED_OUTPATIENT_CLINIC_OR_DEPARTMENT_OTHER)
Admission: RE | Admit: 2016-11-21 | Discharge: 2016-11-21 | Disposition: A | Discharge status: Home / Self Care | Payer: 59 | Source: Ambulatory Visit | Attending: Cardiology | Admitting: Cardiology

## 2016-11-21 ENCOUNTER — Ambulatory Visit (HOSPITAL_COMMUNITY)
Admission: RE | Admit: 2016-11-21 | Discharge: 2016-11-21 | Disposition: A | Discharge status: Home / Self Care | Payer: 59 | Source: Ambulatory Visit | Attending: Family Medicine | Admitting: Family Medicine

## 2016-11-21 VITALS — BP 128/88 | HR 78 | Wt 225.4 lb

## 2016-11-21 DIAGNOSIS — Z800 Family history of malignant neoplasm of digestive organs: Secondary | ICD-10-CM | POA: Insufficient documentation

## 2016-11-21 DIAGNOSIS — Z803 Family history of malignant neoplasm of breast: Secondary | ICD-10-CM | POA: Insufficient documentation

## 2016-11-21 DIAGNOSIS — E119 Type 2 diabetes mellitus without complications: Secondary | ICD-10-CM | POA: Insufficient documentation

## 2016-11-21 DIAGNOSIS — C50412 Malignant neoplasm of upper-outer quadrant of left female breast: Secondary | ICD-10-CM | POA: Insufficient documentation

## 2016-11-21 DIAGNOSIS — Z510 Encounter for antineoplastic radiation therapy: Secondary | ICD-10-CM | POA: Diagnosis not present

## 2016-11-21 DIAGNOSIS — Z87891 Personal history of nicotine dependence: Secondary | ICD-10-CM | POA: Insufficient documentation

## 2016-11-21 DIAGNOSIS — I10 Essential (primary) hypertension: Secondary | ICD-10-CM | POA: Insufficient documentation

## 2016-11-21 DIAGNOSIS — Z171 Estrogen receptor negative status [ER-]: Secondary | ICD-10-CM | POA: Diagnosis not present

## 2016-11-21 DIAGNOSIS — Z808 Family history of malignant neoplasm of other organs or systems: Secondary | ICD-10-CM | POA: Insufficient documentation

## 2016-11-21 DIAGNOSIS — Z7984 Long term (current) use of oral hypoglycemic drugs: Secondary | ICD-10-CM | POA: Insufficient documentation

## 2016-11-21 LAB — ECHOCARDIOGRAM COMPLETE
CHL CUP DOP CALC LVOT VTI: 18.3 cm
CHL CUP STROKE VOLUME: 51 mL
E/e' ratio: 14.5
EWDT: 208 ms
FS: 27 % — AB (ref 28–44)
IV/PV OW: 0.99
LA diam end sys: 43 mm
LA vol A4C: 47 ml
LA vol: 48.3 mL
LADIAMINDEX: 1.95 cm/m2
LASIZE: 43 mm
LAVOLIN: 21.9 mL/m2
LDCA: 3.46 cm2
LV E/e' medial: 14.5
LV E/e'average: 14.5
LV SIMPSON'S DISK: 63
LVDIAVOL: 81 mL (ref 46–106)
LVDIAVOLIN: 37 mL/m2
LVELAT: 7.45 cm/s
LVOT peak grad rest: 2 mmHg
LVOTD: 21 mm
LVOTPV: 75.2 cm/s
LVOTSV: 63 mL
LVSYSVOL: 30 mL (ref 14–42)
LVSYSVOLIN: 14 mL/m2
MV Dec: 208
MV Peak grad: 5 mmHg
MV pk A vel: 87.9 m/s
MV pk E vel: 108 m/s
PW: 9.7 mm — AB (ref 0.6–1.1)
RV LATERAL S' VELOCITY: 11 cm/s
RV TAPSE: 15.1 mm
RV sys press: 22 mmHg
Reg peak vel: 216 cm/s
TDI e' lateral: 7.45
TDI e' medial: 6.66
TR max vel: 216 cm/s

## 2016-11-21 NOTE — Progress Notes (Signed)
Advanced Heart Failure Medication Review by a Pharmacist  Does the patient  feel that his/her medications are working for him/her?  yes  Has the patient been experiencing any side effects to the medications prescribed?  yes  Does the patient measure his/her own blood pressure or blood glucose at home?  yes   Does the patient have any problems obtaining medications due to transportation or finances?   no  Understanding of regimen: good Understanding of indications: good Potential of compliance: good Patient understands to avoid NSAIDs. Patient understands to avoid decongestants.  Issues to address at subsequent visits: None   Pharmacist comments:  Ms. Dacey is a pleasant 56 yo F presenting with a current medication list. She reports good compliance with her regimen and did not have any specific medication-related questions or concerns for me at this time.   Ruta Hinds. Velva Harman, PharmD, BCPS, CPP Clinical Pharmacist Pager: [phone] Phone: [phone] 11/21/2016 9:44 AM      Time with patient: 10 minutes Preparation and documentation time: 2 minutes Total time: 12 minutes

## 2016-11-21 NOTE — Patient Instructions (Signed)
Your physician has requested that you have an echocardiogram. Echocardiography is a painless test that uses sound waves to create images of your heart. It provides your doctor with information about the size and shape of your heart and how well your heart's chambers and valves are working. This procedure takes approximately one hour. There are no restrictions for this procedure.  Your physician recommends that you schedule a follow-up appointment in: 3 months  

## 2016-11-21 NOTE — Progress Notes (Signed)
  Echocardiogram 2D Echocardiogram has been performed.  Jennette Dubin 11/21/2016, 9:26 AM

## 2016-11-21 NOTE — Progress Notes (Signed)
Oncologist: [name]  56 yo referred by [name] with history of type II diabetes, HTN, ulcerative colitis and breast cancer presents for cardio-oncology followup.   Breast cancer was diagnosed 3/18, ER-/PR-/HER2+.  She had docetaxel/carboplatin/Herceptin/Perjeta on 05/11/16 for 4 cycles, stopped due to severe diarrhea/dehydration/UC flare.  Currently getting Herceptin/Perjeta every 3 wks and taking Xeloda.  She had lumpectomy in 9/18.     Symptomatically doing well.  No exertional dyspnea or chest pain. She starts radiation today.   Labs (2/18): K 4.7, creatinine 0.76 Labs (7/18): K 3.8, creatinine 1.0  PMH: 1. Type II diabetes 2. HTN 3. Ulcerative colitis 4. Breast cancer: Diagnosed 3/18, ER-/PR-/HER2+.  She had docetaxel/carboplatin/Herceptin/Perjeta x 4 cycles.  Now getting q3 wk Herceptin/Perjeta as well as Xeloda.  Lumpectomy 9/18.    - Echo (4/18): EF 55-60%, GLS -13.3%, normal RV size and systolic function (technically difficult study).  - Echo (7/18): EF 60-65%, GLS -18.6%, normal RV size and systolic function.  - Echo (10/18): EF 55-60%, GLS -17.1% (difficult strain images), normal diastolic function, normal RV size and systolic function.   Social History   Social History  . Marital status: Married    Spouse name: N/A  . Number of children: 0  . Years of education: N/A   Occupational History  . Not on file.   Social History Main Topics  . Smoking status: Former Smoker    Packs/day: 1.00    Years: 29.00    Types: Cigarettes    Quit date: 10/27/2002  . Smokeless tobacco: Never Used  . Alcohol use No  . Drug use: No  . Sexual activity: Yes    Birth control/ protection: Surgical   Other Topics Concern  . Not on file   Social History Narrative  . No narrative on file   Family History  Problem Relation Age of Onset  . Colon cancer Father   . Stomach cancer Paternal Uncle   . Stomach cancer Paternal Uncle   . Melanoma Brother   . Thyroid cancer Brother   .  Breast cancer Maternal Aunt   . Breast cancer Maternal Aunt   . Breast cancer Cousin    ROS: All systems reviewed and negative except as per HPI.   Current Outpatient Prescriptions  Medication Sig Dispense Refill  . albuterol (PROVENTIL HFA;VENTOLIN HFA) 108 (90 Base) MCG/ACT inhaler Inhale 1-2 puffs into the lungs every 6 (six) hours as needed for wheezing or shortness of breath.    . capecitabine (XELODA) 500 MG tablet Take 4 tabs (2049m) by mouth in AM & 3 tabs (15016m in PM, on days of radiation only (Mon-Fri). Take with water a after meal. 228 tablet 0  . diazepam (VALIUM) 5 MG tablet Take 1 tablet (5 mg total) by mouth as directed. Take 5 mg by mouth 30 minutes prior to MRI procedure.   May repeat x 1. DO NOT Drive after taking Valium as it may cause drowsiness. 2 tablet 0  . diphenoxylate-atropine (LOMOTIL) 2.5-0.025 MG tablet Take one to two tablets every six hours as needed for diarrhea 120 tablet 1  . ferrous sulfate 325 (65 FE) MG tablet Take 325 mg by mouth 2 (two) times daily with a meal.    . gabapentin (NEURONTIN) 100 MG capsule Take 200 mg by mouth at bedtime.     . Homeopathic Products (ZICAM ALLERGY RELIEF NA) Take 1 tablet by mouth daily.    . [name] KitchenYDROcodone-acetaminophen (NORCO/VICODIN) 5-325 MG tablet Take 1-2 tablets by mouth every 6 (  six) hours as needed for moderate pain. 20 tablet 0  . hydrocortisone (ANUSOL-HC) 25 MG suppository Place 1 suppository (25 mg total) rectally 2 (two) times daily. 12 suppository 0  . lidocaine-prilocaine (EMLA) cream Apply 1 application to skin 1.5 to 2 hrs before use.  Cover to secure cream with plastic wrap. 30 g PRN  . loperamide (IMODIUM) 1 MG/5ML solution Take 3 mg by mouth as needed for diarrhea or loose stools.    . magnesium oxide (MAG-OX) 400 MG tablet Take 400 mg by mouth daily.     . Mesalamine 800 MG TBEC Take 1,600 mg by mouth 2 (two) times daily.    . metFORMIN (GLUCOPHAGE-XR) 500 MG 24 hr tablet Take 1,000 mg by mouth at  bedtime.    . metoprolol (LOPRESSOR) 50 MG tablet Take 50 mg by mouth 2 (two) times daily. Takes 1 in the am and 1 at HS    . niacin 250 MG tablet Take 250 mg by mouth at bedtime.     . potassium chloride SA (K-DUR,KLOR-CON) 20 MEQ tablet Take 1 tablet (20 mEq total) by mouth daily. 30 tablet 1  . traMADol (ULTRAM) 50 MG tablet Take 1-2 tablets (50-100 mg total) by mouth every 6 (six) hours as needed. 40 tablet 0  . triamterene-hydrochlorothiazide (MAXZIDE) 75-50 MG per tablet Take 0.5 tablets by mouth daily.     . dicyclomine (BENTYL) 20 MG tablet Take 1 tablet (20 mg total) by mouth 4 (four) times daily -  before meals and at bedtime. (Patient not taking: Reported on 11/21/2016) 30 tablet 0  . LORazepam (ATIVAN) 0.5 MG tablet Take 1 tablet (0.5 mg total) by mouth once as needed for anxiety. (Patient not taking: Reported on 11/21/2016) 2 tablet 0  . ondansetron (ZOFRAN) 8 MG tablet Take 1 tablet (8 mg total) by mouth 2 (two) times daily as needed for refractory nausea / vomiting. Start on day 3 after chemo. (Patient not taking: Reported on 11/21/2016) 30 tablet 1  . prochlorperazine (COMPAZINE) 10 MG tablet Take 1 tablet (10 mg total) by mouth every 6 (six) hours as needed (Nausea or vomiting). (Patient not taking: Reported on 11/21/2016) 30 tablet 1   No current facility-administered medications for this encounter.    Facility-Administered Medications Ordered in Other Encounters  Medication Dose Route Frequency Provider Last Rate Last Dose  . heparin lock flush 100 unit/mL  500 Units Intracatheter Once PRN Feng, Yan, MD      . sodium chloride flush (NS) 0.9 % injection 10 mL  10 mL Intracatheter PRN Feng, Yan, MD      . sodium chloride flush (NS) 0.9 % injection 10 mL  10 mL Intracatheter PRN Feng, Yan, MD   10 mL at 08/24/16 1725  . sodium chloride flush (NS) 0.9 % injection 10 mL  10 mL Intracatheter PRN Feng, Yan, MD   10 mL at 11/16/16 1628     BP 128/88   Pulse 78   Wt 225 lb 6.4 oz  (102.2 kg)   SpO2 98%   BMI 38.09 kg/m  General: NAD Neck: No JVD, no thyromegaly or thyroid nodule.  Lungs: Clear to auscultation bilaterally with normal respiratory effort. CV: Nondisplaced PMI.  Heart regular S1/S2, no S3/S4, no murmur.  No peripheral edema.  No carotid bruit.  Normal pedal pulses.  Abdomen: Soft, nontender, no hepatosplenomegaly, no distention.  Skin: Intact without lesions or rashes.  Neurologic: Alert and oriented x 3.  Psych: Normal affect. Extremities: No clubbing   or cyanosis.  HEENT: Normal.   Assessment/Plan: 1. Breast cancer: HER2+ breast cancer.  She is getting a Herceptin-based regimen. I reviewed today's echo.  Her images are difficult and I am not sure how accurate the strain is.  However, EF remains preserved.  - Continue Herceptin, repeat echo in 3 months.  2. HTN: BP is controlled.   Followup in 3 months with echo.    Loralie Champagne 11/21/2016

## 2016-11-21 NOTE — Progress Notes (Signed)
  Radiation Oncology         [phone]) [phone] ________________________________  Name: Brandi Dickson MRN: [id number]  Date: 11/21/2016  DOB: April 05, 1960  Simulation Verification Note    ICD-10-CM   1. Malignant neoplasm of upper-outer quadrant of left breast in female, estrogen receptor negative ([place]) C50.412    Z17.1     Status: outpatient  NARRATIVE: The patient was brought to the treatment unit and placed in the planned treatment position. The clinical setup was verified. Then port films were obtained and uploaded to the radiation oncology medical record software.  The treatment beams were carefully compared against the planned radiation fields. The position location and shape of the radiation fields was reviewed. They targeted volume of tissue appears to be appropriately covered by the radiation beams. Organs at risk appear to be excluded as planned.  Based on my personal review, I approved the simulation verification. The patient's treatment will proceed as planned.  -----------------------------------  Blair Promise, PhD, MD

## 2016-11-22 ENCOUNTER — Ambulatory Visit
Admission: RE | Admit: 2016-11-22 | Discharge: 2016-11-22 | Disposition: A | Discharge status: Home / Self Care | Payer: 59 | Source: Ambulatory Visit | Attending: Radiation Oncology | Admitting: Radiation Oncology

## 2016-11-22 DIAGNOSIS — K5130 Ulcerative (chronic) rectosigmoiditis without complications: Secondary | ICD-10-CM | POA: Diagnosis not present

## 2016-11-22 DIAGNOSIS — C50412 Malignant neoplasm of upper-outer quadrant of left female breast: Secondary | ICD-10-CM | POA: Diagnosis not present

## 2016-11-22 DIAGNOSIS — R197 Diarrhea, unspecified: Secondary | ICD-10-CM | POA: Diagnosis not present

## 2016-11-22 DIAGNOSIS — E876 Hypokalemia: Secondary | ICD-10-CM | POA: Diagnosis not present

## 2016-11-22 DIAGNOSIS — Z510 Encounter for antineoplastic radiation therapy: Secondary | ICD-10-CM | POA: Diagnosis not present

## 2016-11-23 ENCOUNTER — Ambulatory Visit
Admission: RE | Admit: 2016-11-23 | Discharge: 2016-11-23 | Disposition: A | Discharge status: Home / Self Care | Payer: 59 | Source: Ambulatory Visit | Attending: Radiation Oncology | Admitting: Radiation Oncology

## 2016-11-23 DIAGNOSIS — Z510 Encounter for antineoplastic radiation therapy: Secondary | ICD-10-CM | POA: Diagnosis not present

## 2016-11-23 DIAGNOSIS — C50412 Malignant neoplasm of upper-outer quadrant of left female breast: Secondary | ICD-10-CM | POA: Diagnosis not present

## 2016-11-24 ENCOUNTER — Other Ambulatory Visit: Payer: Self-pay | Admitting: Hematology

## 2016-11-26 ENCOUNTER — Ambulatory Visit
Admission: RE | Admit: 2016-11-26 | Discharge: 2016-11-26 | Disposition: A | Discharge status: Home / Self Care | Payer: 59 | Source: Ambulatory Visit | Attending: Radiation Oncology | Admitting: Radiation Oncology

## 2016-11-26 DIAGNOSIS — C50412 Malignant neoplasm of upper-outer quadrant of left female breast: Secondary | ICD-10-CM | POA: Diagnosis not present

## 2016-11-26 DIAGNOSIS — Z510 Encounter for antineoplastic radiation therapy: Secondary | ICD-10-CM | POA: Diagnosis not present

## 2016-11-27 ENCOUNTER — Ambulatory Visit
Admission: RE | Admit: 2016-11-27 | Discharge: 2016-11-27 | Disposition: A | Discharge status: Home / Self Care | Payer: 59 | Source: Ambulatory Visit | Attending: Radiation Oncology | Admitting: Radiation Oncology

## 2016-11-27 DIAGNOSIS — Z510 Encounter for antineoplastic radiation therapy: Secondary | ICD-10-CM | POA: Diagnosis not present

## 2016-11-27 DIAGNOSIS — C50412 Malignant neoplasm of upper-outer quadrant of left female breast: Secondary | ICD-10-CM | POA: Diagnosis not present

## 2016-11-28 ENCOUNTER — Ambulatory Visit
Admission: RE | Admit: 2016-11-28 | Discharge: 2016-11-28 | Disposition: A | Discharge status: Home / Self Care | Payer: 59 | Source: Ambulatory Visit | Attending: Radiation Oncology | Admitting: Radiation Oncology

## 2016-11-28 DIAGNOSIS — Z510 Encounter for antineoplastic radiation therapy: Secondary | ICD-10-CM | POA: Diagnosis not present

## 2016-11-28 DIAGNOSIS — C50412 Malignant neoplasm of upper-outer quadrant of left female breast: Secondary | ICD-10-CM | POA: Diagnosis not present

## 2016-11-29 ENCOUNTER — Ambulatory Visit
Admission: RE | Admit: 2016-11-29 | Discharge: 2016-11-29 | Disposition: A | Discharge status: Home / Self Care | Payer: 59 | Source: Ambulatory Visit | Attending: Radiation Oncology | Admitting: Radiation Oncology

## 2016-11-29 DIAGNOSIS — Z510 Encounter for antineoplastic radiation therapy: Secondary | ICD-10-CM | POA: Diagnosis not present

## 2016-11-29 DIAGNOSIS — C50412 Malignant neoplasm of upper-outer quadrant of left female breast: Secondary | ICD-10-CM | POA: Diagnosis not present

## 2016-11-30 ENCOUNTER — Telehealth: Payer: Self-pay | Admitting: Nurse Practitioner

## 2016-11-30 ENCOUNTER — Ambulatory Visit (HOSPITAL_BASED_OUTPATIENT_CLINIC_OR_DEPARTMENT_OTHER): Payer: 59 | Admitting: Nurse Practitioner

## 2016-11-30 ENCOUNTER — Other Ambulatory Visit (HOSPITAL_BASED_OUTPATIENT_CLINIC_OR_DEPARTMENT_OTHER): Payer: 59

## 2016-11-30 ENCOUNTER — Ambulatory Visit
Admission: RE | Admit: 2016-11-30 | Discharge: 2016-11-30 | Disposition: A | Discharge status: Home / Self Care | Payer: 59 | Source: Ambulatory Visit | Attending: Radiation Oncology | Admitting: Radiation Oncology

## 2016-11-30 ENCOUNTER — Encounter: Payer: Self-pay | Admitting: Nurse Practitioner

## 2016-11-30 VITALS — BP 131/48 | HR 72 | Temp 98.6°F | Resp 18 | Ht 64.5 in | Wt 223.7 lb

## 2016-11-30 DIAGNOSIS — E876 Hypokalemia: Secondary | ICD-10-CM

## 2016-11-30 DIAGNOSIS — Z170 Estrogen receptor positive status [ER+]: Secondary | ICD-10-CM

## 2016-11-30 DIAGNOSIS — Z171 Estrogen receptor negative status [ER-]: Secondary | ICD-10-CM

## 2016-11-30 DIAGNOSIS — C50412 Malignant neoplasm of upper-outer quadrant of left female breast: Secondary | ICD-10-CM | POA: Diagnosis not present

## 2016-11-30 DIAGNOSIS — K5130 Ulcerative (chronic) rectosigmoiditis without complications: Secondary | ICD-10-CM | POA: Diagnosis not present

## 2016-11-30 DIAGNOSIS — K51311 Ulcerative (chronic) rectosigmoiditis with rectal bleeding: Secondary | ICD-10-CM

## 2016-11-30 DIAGNOSIS — R197 Diarrhea, unspecified: Secondary | ICD-10-CM

## 2016-11-30 DIAGNOSIS — Z510 Encounter for antineoplastic radiation therapy: Secondary | ICD-10-CM | POA: Diagnosis not present

## 2016-11-30 LAB — CBC WITH DIFFERENTIAL/PLATELET
BASO%: 0.2 % (ref 0.0–2.0)
BASOS ABS: 0 10*3/uL (ref 0.0–0.1)
EOS ABS: 0.1 10*3/uL (ref 0.0–0.5)
EOS%: 1 % (ref 0.0–7.0)
HCT: 39.1 % (ref 34.8–46.6)
HGB: 12.6 g/dL (ref 11.6–15.9)
LYMPH%: 27.9 % (ref 14.0–49.7)
MCH: 29.1 pg (ref 25.1–34.0)
MCHC: 32.3 g/dL (ref 31.5–36.0)
MCV: 90.1 fL (ref 79.5–101.0)
MONO#: 0.4 10*3/uL (ref 0.1–0.9)
MONO%: 7.9 % (ref 0.0–14.0)
NEUT#: 3.5 10*3/uL (ref 1.5–6.5)
NEUT%: 63 % (ref 38.4–76.8)
Platelets: 203 10*3/uL (ref 145–400)
RBC: 4.35 10*6/uL (ref 3.70–5.45)
RDW: 14.2 % (ref 11.2–14.5)
WBC: 5.5 10*3/uL (ref 3.9–10.3)
lymph#: 1.5 10*3/uL (ref 0.9–3.3)

## 2016-11-30 LAB — COMPREHENSIVE METABOLIC PANEL
ALBUMIN: 3.4 g/dL — AB (ref 3.5–5.0)
ALK PHOS: 74 U/L (ref 40–150)
ALT: 16 U/L (ref 0–55)
AST: 22 U/L (ref 5–34)
Anion Gap: 9 mEq/L (ref 3–11)
BILIRUBIN TOTAL: 0.47 mg/dL (ref 0.20–1.20)
BUN: 17.5 mg/dL (ref 7.0–26.0)
CO2: 29 meq/L (ref 22–29)
CREATININE: 0.8 mg/dL (ref 0.6–1.1)
Calcium: 9.4 mg/dL (ref 8.4–10.4)
Chloride: 103 mEq/L (ref 98–109)
Glucose: 95 mg/dl (ref 70–140)
POTASSIUM: 3.1 meq/L — AB (ref 3.5–5.1)
SODIUM: 141 meq/L (ref 136–145)
Total Protein: 6.8 g/dL (ref 6.4–8.3)

## 2016-11-30 MED ORDER — CAPECITABINE 500 MG PO TABS: ORAL_TABLET | ORAL | 0 refills | Status: DC

## 2016-11-30 MED ORDER — DIPHENOXYLATE-ATROPINE 2.5-0.025 MG PO TABS: ORAL_TABLET | ORAL | 1 refills | Status: DC

## 2016-11-30 MED ORDER — POTASSIUM CHLORIDE CRYS ER 20 MEQ PO TBCR: 20.0000 meq | EXTENDED_RELEASE_TABLET | Freq: Two times a day (BID) | ORAL | 2 refills | Status: DC

## 2016-11-30 NOTE — Telephone Encounter (Signed)
Gave avs and calendar for November

## 2016-11-30 NOTE — Progress Notes (Signed)
[place]  [phone] [phone]  Clinic Follow up Note   Patient Care Team: Orpah Melter, MD as PCP - General (Family Medicine) [name], MD as Consulting Physician (General Surgery) Truitt Merle, MD as Consulting Physician (Hematology) Gery Pray, MD as Consulting Physician (Radiation Oncology) Ladene Artist, MD as Consulting Physician (Gastroenterology) 11/30/2016  SUMMARY OF ONCOLOGIC HISTORY: Oncology History   Cancer Staging Breast cancer of upper-outer quadrant of left female breast [facility]) Staging form: Breast, AJCC 8th Edition - Clinical stage from 04/25/2016: Stage IIA (cT2, cN0, cM0, G3, ER: Negative, PR: Negative, HER2: Positive) - Signed by Truitt Merle, MD on 05/02/2016 - Pathologic stage from 10/09/2016: No Stage Recommended (ypT2, pN1a, cM0, G3, ER: Negative, PR: Negative, HER2: Positive) - Signed by Truitt Merle, MD on 10/26/2016       Breast cancer of upper-outer quadrant of left female breast ([place])   04/24/2016 Mammogram    Category B breasts with a new irregular mass in the UOQ left breast middle depth. Ultrasound revealed a 3.9 cm mass in the 1:00 position. The left axilla was negative.       04/25/2016 Initial Biopsy    Biopsy of the left breast showed grade 3 invasive ductal carcinoma, DCIS, and lymphovascular invasion was present.      04/25/2016 Receptors her2    ER 0% negative, PR 0% negative, HER2 positive, Ki67 30%      05/02/2016 Initial Diagnosis    Breast cancer of upper-outer quadrant of left female breast ([place])     05/07/2016 Imaging    MRI of the bilateral breast 05/07/16 IMPRESSION: Lobulated enhancing mass (4.0 x 2.7 x 3.1 cm) in the upper-outer quadrant of the left breast corresponding with the recently diagnosed invasive mammary carcinoma. Linear enhancement extends 2.6 cm posterior to the mass worrisome for ductal carcinoma in-situ.      05/09/2016 Echocardiogram    Echo 05/09/16 -LF EF: 55-60%      05/11/2016 - 08/31/2016 Neo-Adjuvant Chemotherapy    Cycle 1 Docetaxel, Carboplatin, Herceptin and pejeta (TCHP), cycle 2 Docetaxel changed to Taxol, carbo dose reduced to AUC 4.5 (from 5) due to severe diarrhea and cytopenia, Perjeta held after cycle 2.   Due to poor toleration, [name] was held after cycle 4 and Taxol changed to weekly X4. Herceptin was continued during the above chemo.        05/13/2016 Imaging    CT Angio Chest PE IMPRESSION: No evidence of pulmonary emboli. Left breast mass consistent with the given clinical history. Stable left adrenal lesion likely representing a small adenoma.      05/18/2016 - 05/24/2016 Hospital Admission    Patient presented with nausea, vomiting, and diarrhea; admitted to hospital with Hyponatremia      06/07/2016 - 06/09/2016 Hospital Admission    Patient presents to hospital complaints of rectal bleeding and abdominal cramps when defacating      06/07/2016 Imaging    CT ABDOMEN PELVIS W CONTRAST  IMPRESSION: 1. Wall thickening of the descending and sigmoid colon consistent with an infectious or inflammatory colitis. 2. There is also a short segment of narrowing and possible wall thickening of the superior ascending colon which could reflect a constricting mass or, more likely, be an area of persistent colonic spasm. This could be further assessed with either colonoscopy or a barium enema after the current symptoms of colitis have resolved. 3. Small, subcentimeter, low-density liver lesions which may all be benign. However, 3 these are not evident on prior  CT. Liver metastatic disease possible. These could be further assessed with liver MRI with and without contrast. 4. Adrenal lesions which are stable, on the right and myelolipoma and on the left most likely an adenoma.      06/07/2016 Imaging    CT A/P IMPRESSION: 1. Wall thickening of the descending and sigmoid colon consistent with an infectious or inflammatory colitis. 2. There  is also a short segment of narrowing and possible wall thickening of the superior ascending colon which could reflect a constricting mass or, more likely, be an area of persistent colonic spasm. This could be further assessed with either colonoscopy or a barium enema after the current symptoms of colitis have resolved. 3. Small, subcentimeter, low-density liver lesions which may all be benign. However, 3 these are not evident on prior CT. Liver metastatic disease possible. These could be further assessed with liver MRI with and without contrast. 4. Adrenal lesions which are stable, on the right and myelolipoma and on the left most likely an adenoma.      06/07/2016 - 06/09/2016 Hospital Admission    Diarrhea and rectal Bleeding      07/19/2016 - 07/20/2016 Hospital Admission    Diarrhea and dehydration      08/09/2016 Mammogram    Mammogram 08/09/16 IMPRESSION:  The 3.1 cm x 2 cm x 1.5 cm irregular equal density mass in the left breast is consistent with the known carcinoma showing mammographic evidence of preoperative chemotherapy response.         08/09/2016 Imaging    [place] of left breast 08/09/16 IMPRESSION:  The 2.7 cm lobulated mass in the left breast is a known biopsy positive for malignancy. Surgical and oncology consult in progress.      08/16/2016 Imaging    MRI Abdomen W WO Contrast IMPRESSION: Tiny sub-cm hepatic cysts. No evidence of metastatic disease or other acute findings. Tiny benign left adrenal adenoma and right adrenal myelolipoma.      09/14/2016 -  Chemotherapy    Maintenance Herceptin every 3 weeks.      09/17/2016 Imaging    MRI Breast Bilateral 09/17/16 IMPRESSION: Smaller left breast mass, now measuring 2.5 cm. No new or suspicious enhancement in either breast.  RECOMMENDATION: Treatment plan.      10/09/2016 Surgery    LEFT BREAST LUMPECTOMY WITH RADIOACTIVE SEED AND L4EFT AXILLARY SENTINEL LYMPH NODE BIOPSY by Dr. Lucia Gaskins on 10/09/16       10/09/2016 Pathology Results    Diagnosis 10/09/16 1. Breast, lumpectomy, Left - INVASIVE DUCTAL CARCINOMA, GRADE 3, SPANNING 2.4 CM. - HIGH GRADE DUCTAL CARCINOMA IN SITU WITH NECROSIS. - RESECTION MARGINS ARE NEGATIVE FOR CARCINOMA. - BIOPSY SITE. - SEE ONCOLOGY TABLE. 2. Breast, excision, Left additional medial margin - FIBROCYSTIC CHANGE. - NO MALIGNANCY IDENTIFIED. 3. Lymph node, sentinel, biopsy, Left Axillary - METASTATIC CARCINOMA IN ONE OF ONE LYMPH NODES (1/1).       CURRENT THERAPY:  1. Maintenance Herceptin every 3 weeks, started on 06/01/16. Add Perjeta back starting 10/26/16. 2. Xeloda to start 10/22. With radiation she will be on low dose 4 tablets in the am and 3 tablets in the pm. Without radiation she will be on full dose 4 tablets in the am and 4 tablets in the pm.  3. left breast radiation to start 11/22/16   INTERVAL HISTORY: Brandi Dickson returns today for follow-up as scheduled. She began adjuvant Xeloda 2000 mg a.m. and 1500 mg p.m. M-F on 11/19/2016 and radiation to left breast on 11/22/2016.  Diarrhea began on day 1 of Xeloda, 2-3 episodes per day. She takes 6 Imodium per day, 2 tablets with each meal. She denies blood in stool. 1 episode of emesis after taking Xeloda without food, denies other nausea. Slightly decreased appetite and by mouth liquid intake. She has mild fatigue over baseline but continues to work full-time. Stable knee, neck, right shoulder joint pain, does not take medication. Stable peripheral neuropathy to right hand and bilateral feet that does not compromise function. Takes 200-300 mg gabapentin at bedtime.  REVIEW OF SYSTEMS:   Constitutional: Denies fevers, chills or abnormal weight loss (+) mild fatigue over baseline (+) decreased appetite Eyes: Denies blurriness of vision Ears, nose, mouth, throat, and face: Denies mucositis or sore throat Respiratory: Denies cough, dyspnea or wheezes Cardiovascular: Denies palpitation, chest discomfort (+)  lower extremity swelling, denies calf tenderness Gastrointestinal:  Denies nausea, constipation, heartburn or change in bowel habits (+) 2-3 episodes diarrhea per day, no blood, managed with 6 imodium per day, 2 TID with meals Skin: Denies abnormal skin rashes.  Lymphatics: Denies new lymphadenopathy or easy bruising Neurological:Denies new weaknesses (+) stable neuropathy to right hand and feet bilaterally, worse at night Behavioral/Psych: Mood is stable, no new changes  MSK: (+) stable joint pain, neck, R shoulder, bilateral knees  All other systems were reviewed with the patient and are negative.  MEDICAL HISTORY:  Past Medical History:  Diagnosis Date  . Arthritis   . Asthma    triggered with [name] perfumes and cigarette smoke  . Cancer ([place])   . Colitis   . Diabetes mellitus without complication ([place])   . Hypertension   . Neuropathy     SURGICAL HISTORY: Past Surgical History:  Procedure Laterality Date  . ABDOMINAL HYSTERECTOMY    . BREAST LUMPECTOMY WITH RADIOACTIVE SEED AND SENTINEL LYMPH NODE BIOPSY Left 10/09/2016   Procedure: LEFT BREAST LUMPECTOMY WITH RADIOACTIVE SEED AND L4EFT AXILLARY SENTINEL LYMPH NODE BIOPSY;  Surgeon: [name], MD;  Location: [facility];  Service: General;  Laterality: Left;  . DILATION AND CURETTAGE OF UTERUS    . KNEE ARTHROSCOPY Left   . PORTACATH PLACEMENT Right 05/08/2016   Procedure: INSERTION PORT-A-CATH WITH [place];  Surgeon: [name], MD;  Location: [facility];  Service: General;  Laterality: Right;  . TONSILLECTOMY    . TOTAL HIP ARTHROPLASTY Right   . TOTAL HIP ARTHROPLASTY Left 03/06/2016   Procedure: LEFT TOTAL HIP ARTHROPLASTY ANTERIOR APPROACH;  Surgeon: [name], MD;  Location: [facility];  Service: Orthopedics;  Laterality: Left;    I have reviewed the social history and family history with the patient and they are unchanged from previous note.  ALLERGIES:  is allergic to lisinopril;  augmentin [amoxicillin-pot clavulanate]; benadryl [diphenhydramine]; and losartan potassium.  MEDICATIONS:  Current Outpatient Prescriptions  Medication Sig Dispense Refill  . albuterol (PROVENTIL HFA;VENTOLIN HFA) 108 (90 Base) MCG/ACT inhaler Inhale 1-2 puffs into the lungs every 6 (six) hours as needed for wheezing or shortness of breath.    . capecitabine (XELODA) 500 MG tablet Take 4 tabs (2080m) by mouth in AM & 3 tabs (15048m in PM, on days of radiation only (Mon-Fri). Take with water a after meal. 228 tablet 0  . diphenoxylate-atropine (LOMOTIL) 2.5-0.025 MG tablet Take one to two tablets every six hours as needed for diarrhea 120 tablet 1  . ferrous sulfate 325 (65 FE) MG tablet Take 325 mg by mouth 2 (two) times daily with a meal.    .  gabapentin (NEURONTIN) 100 MG capsule Take 200 mg by mouth at bedtime.     . hydrocortisone (ANUSOL-HC) 25 MG suppository Place 1 suppository (25 mg total) rectally 2 (two) times daily. 12 suppository 0  . lidocaine-prilocaine (EMLA) cream Apply 1 application to skin 1.5 to 2 hrs before use.  Cover to secure cream with plastic wrap. 30 g PRN  . loperamide (IMODIUM) 1 MG/5ML solution Take 3 mg by mouth as needed for diarrhea or loose stools.    . magnesium oxide (MAG-OX) 400 MG tablet Take 400 mg by mouth daily.     . Mesalamine 800 MG TBEC Take 1,600 mg by mouth 2 (two) times daily.    . metFORMIN (GLUCOPHAGE-XR) 500 MG 24 hr tablet Take 1,000 mg by mouth at bedtime.    . metoprolol (LOPRESSOR) 50 MG tablet Take 50 mg by mouth 2 (two) times daily. Takes 1 in the am and 1 at HS    . niacin 250 MG tablet Take 250 mg by mouth at bedtime.     . potassium chloride SA (K-DUR,KLOR-CON) 20 MEQ tablet Take 1 tablet (20 mEq total) by mouth 2 (two) times daily. 60 tablet 2  . traMADol (ULTRAM) 50 MG tablet Take 1-2 tablets (50-100 mg total) by mouth every 6 (six) hours as needed. 40 tablet 0  . triamterene-hydrochlorothiazide (MAXZIDE) 75-50 MG per tablet Take 0.5  tablets by mouth daily.     . diazepam (VALIUM) 5 MG tablet Take 1 tablet (5 mg total) by mouth as directed. Take 5 mg by mouth 30 minutes prior to MRI procedure.   May repeat x 1. DO NOT Drive after taking Valium as it may cause drowsiness. 2 tablet 0  . dicyclomine (BENTYL) 20 MG tablet Take 1 tablet (20 mg total) by mouth 4 (four) times daily -  before meals and at bedtime. (Patient not taking: Reported on 11/21/2016) 30 tablet 0  . Homeopathic Products (ZICAM ALLERGY RELIEF NA) Take 1 tablet by mouth daily.    [name] Kitchen HYDROcodone-acetaminophen (NORCO/VICODIN) 5-325 MG tablet Take 1-2 tablets by mouth every 6 (six) hours as needed for moderate pain. 20 tablet 0  . LORazepam (ATIVAN) 0.5 MG tablet Take 1 tablet (0.5 mg total) by mouth once as needed for anxiety. (Patient not taking: Reported on 11/21/2016) 2 tablet 0  . ondansetron (ZOFRAN) 8 MG tablet Take 1 tablet (8 mg total) by mouth 2 (two) times daily as needed for refractory nausea / vomiting. Start on day 3 after chemo. (Patient not taking: Reported on 11/21/2016) 30 tablet 1  . prochlorperazine (COMPAZINE) 10 MG tablet Take 1 tablet (10 mg total) by mouth every 6 (six) hours as needed (Nausea or vomiting). (Patient not taking: Reported on 11/21/2016) 30 tablet 1   No current facility-administered medications for this visit.    Facility-Administered Medications Ordered in Other Visits  Medication Dose Route Frequency Provider Last Rate Last Dose  . heparin lock flush 100 unit/mL  500 Units Intracatheter Once PRN Truitt Merle, MD      . sodium chloride flush (NS) 0.9 % injection 10 mL  10 mL Intracatheter PRN Truitt Merle, MD      . sodium chloride flush (NS) 0.9 % injection 10 mL  10 mL Intracatheter PRN Truitt Merle, MD   10 mL at 08/24/16 1725  . sodium chloride flush (NS) 0.9 % injection 10 mL  10 mL Intracatheter PRN Truitt Merle, MD   10 mL at 11/16/16 1628    PHYSICAL EXAMINATION: ECOG  PERFORMANCE STATUS: 1 - Symptomatic but completely  ambulatory  Vitals:   11/30/16 0910  BP: (!) 131/48  Pulse: 72  Resp: 18  Temp: 98.6 F (37 C)  SpO2: 99%   Filed Weights   11/30/16 0910  Weight: 223 lb 11.2 oz (101.5 kg)    GENERAL:alert, no distress and comfortable SKIN: skin color, texture, turgor are normal, no rashes or significant lesions. No palmar-plantar erythema EYES: normal, Conjunctiva are pink and non-injected, sclera clear OROPHARYNX:no exudate, no erythema and lips, buccal mucosa, and tongue normal  NECK: supple, thyroid normal size, non-tender, without nodularity LYMPH:  no palpable cervical, supraclavicular, or axillary lymphadenopathy LUNGS: clear to auscultation bilaterally with normal breathing effort HEART: regular rate & rhythm, no murmurs (+)  symmetrical lower extremity edema, no calf tenderness or erythema. ABDOMEN:abdomen soft, round, non-tender and normal bowel sounds. No palpable organomegaly or masses Musculoskeletal:no cyanosis of digits and no clubbing. No joint tenderness or erythema  NEURO: alert & oriented x 3 with fluent speech, no focal motor deficits (+) moderately decreased vibratory sense to feet bilaterally per tuning fork exam BREAST: (+) s/p lumpectomy to UOQ of left breast, incision well healed, no discharge  PAC without erythema.  LABORATORY DATA:  I have reviewed the data as listed CBC Latest Ref Rng & Units 11/30/2016 11/16/2016 10/26/2016  WBC 3.9 - 10.3 10e3/uL 5.5 5.7 5.4  Hemoglobin 11.6 - 15.9 g/dL 12.6 12.5 11.8  Hematocrit 34.8 - 46.6 % 39.1 38.9 37.2  Platelets 145 - 400 10e3/uL 203 219 208     CMP Latest Ref Rng & Units 11/30/2016 11/16/2016 10/26/2016  Glucose 70 - 140 mg/dl 95 111 185(H)  BUN 7.0 - 26.0 mg/dL 17.5 17.3 18.7  Creatinine 0.6 - 1.1 mg/dL 0.8 0.8 0.8  Sodium 136 - 145 mEq/L 141 141 143  Potassium 3.5 - 5.1 mEq/L 3.1(L) 3.4(L) 3.0(LL)  Chloride 101 - 111 mmol/L - - -  CO2 22 - 29 mEq/L '29 25 27  ' Calcium 8.4 - 10.4 mg/dL 9.4 9.0 9.2  Total Protein 6.4 -  8.3 g/dL 6.8 6.6 6.4  Total Bilirubin 0.20 - 1.20 mg/dL 0.47 0.45 0.39  Alkaline Phos 40 - 150 U/L 74 77 84  AST 5 - 34 U/L '22 20 17  ' ALT 0 - 55 U/L '16 16 15    ' RADIOGRAPHIC STUDIES: I have personally reviewed the radiological images as listed and agreed with the findings in the report. No results found.   ASSESSMENT & PLAN: 56 y.o. post-menopausal Caucasian female with a self palpated left breast mass.  1. Breast cancer of upper-outer quadrant of left breast, invasive ductal carcinoma,  stage IIA (cT2N0M0) grade 3, ER-, PR-, HER2 amplified 2. Genetics 3. Type 2 DM, HTN 4. Ulcerative colitis 5. Arthritis 6. Peripheral neuropathy  7. Hypokalemia 8. Diarrhea from Xeloda   Ms. Siever appears to be tolerating chemoradiation with Xeloda well overall. She is sensitive to chemo and developed diarrhea early with xeloda. She manages with imodium 2 tabs TID with meals, I gave prescription for lomotil PRN. if she has slight worsening of diarrhea, she will reduce to 3 tabs AM and 3 tabs PM M-F; if diarrhea becomes severe and unmanaged by imodium or lomotil she will hold and call clinic. We discussed increasing po liquids with diarrhea, she will drink more. She has mild hypokalemia today, K 3.1 and has been off oral K for 3 days. She will restart oral K 20 mEq take 1 tab twice daily. CBC WNL. Physical  exam normal. Echo 11/21/16 LVEF 18-98%, normal systolic and diastolic function. She will continue herceptin/perjeta q3 weeks, next on 11/9. She will return for lab lab and f/u with APP 11/16.  PLAN:  -prescription for lomotil PRN, continue imodium -Increase po liquid intake with diarrhea -Change Rx: Oral K 20 mEq 1 tablet BID -Labs, echo, physical exam adequate for treatment -continue chemoradiation with xeloda 4 tabs AM/ 3 tabs PM Monday through Friday with radiation; herceptin/perjeta q3 weeks  All questions were answered. The patient knows to call the clinic with any problems, questions or  concerns. No barriers to learning was detected.  Cira Rue NP  11/30/16   I have seen the patient, examined her. I agree with the assessment and and plan and have edited the notes.   She know to hold and reduce Xeloda to 15108m bid if diarrhea gets worse and call [place]   FTruitt Merle 11/30/2016

## 2016-12-03 ENCOUNTER — Ambulatory Visit
Admission: RE | Admit: 2016-12-03 | Discharge: 2016-12-03 | Disposition: A | Discharge status: Home / Self Care | Payer: 59 | Source: Ambulatory Visit | Attending: Radiation Oncology | Admitting: Radiation Oncology

## 2016-12-03 DIAGNOSIS — C50412 Malignant neoplasm of upper-outer quadrant of left female breast: Secondary | ICD-10-CM | POA: Diagnosis not present

## 2016-12-03 DIAGNOSIS — Z510 Encounter for antineoplastic radiation therapy: Secondary | ICD-10-CM | POA: Diagnosis not present

## 2016-12-04 ENCOUNTER — Ambulatory Visit
Admission: RE | Admit: 2016-12-04 | Discharge: 2016-12-04 | Disposition: A | Discharge status: Home / Self Care | Payer: 59 | Source: Ambulatory Visit | Attending: Radiation Oncology | Admitting: Radiation Oncology

## 2016-12-04 DIAGNOSIS — C50412 Malignant neoplasm of upper-outer quadrant of left female breast: Secondary | ICD-10-CM

## 2016-12-04 DIAGNOSIS — Z171 Estrogen receptor negative status [ER-]: Principal | ICD-10-CM

## 2016-12-04 DIAGNOSIS — Z510 Encounter for antineoplastic radiation therapy: Secondary | ICD-10-CM | POA: Diagnosis not present

## 2016-12-04 MED ORDER — SONAFINE EX EMUL
1.0000 "application " | Freq: Two times a day (BID) | CUTANEOUS | Status: DC
  Administered 2016-12-04: 1 via TOPICAL

## 2016-12-04 NOTE — Progress Notes (Signed)
Pt here for patient teaching.  Pt given Radiation and You booklet, Managing Acute Radiation Side Effects for Head and Neck Cancer handout, skin care instructions and Sonafine.  Reviewed areas of pertinence such as fatigue, skin changes, breast tenderness and breast swelling . Pt able to give teach back of to pat skin and use unscented/gentle soap,apply Sonafine bid, avoid applying anything to skin within 4 hours of treatment, avoid wearing an under wire bra and to use an electric razor if they must shave. Pt needs reinforcement, no evidence of learning and refused teaching of information given and will contact nursing with any questions or concerns.     [email or web address]

## 2016-12-05 ENCOUNTER — Ambulatory Visit
Admission: RE | Admit: 2016-12-05 | Discharge: 2016-12-05 | Disposition: A | Discharge status: Home / Self Care | Payer: 59 | Source: Ambulatory Visit | Attending: Radiation Oncology | Admitting: Radiation Oncology

## 2016-12-05 DIAGNOSIS — Z510 Encounter for antineoplastic radiation therapy: Secondary | ICD-10-CM | POA: Diagnosis not present

## 2016-12-05 DIAGNOSIS — C50412 Malignant neoplasm of upper-outer quadrant of left female breast: Secondary | ICD-10-CM | POA: Diagnosis not present

## 2016-12-05 DIAGNOSIS — Z5112 Encounter for antineoplastic immunotherapy: Secondary | ICD-10-CM | POA: Diagnosis not present

## 2016-12-06 ENCOUNTER — Ambulatory Visit
Admission: RE | Admit: 2016-12-06 | Discharge: 2016-12-06 | Disposition: A | Discharge status: Home / Self Care | Payer: 59 | Source: Ambulatory Visit | Attending: Radiation Oncology | Admitting: Radiation Oncology

## 2016-12-06 DIAGNOSIS — Z510 Encounter for antineoplastic radiation therapy: Secondary | ICD-10-CM | POA: Diagnosis not present

## 2016-12-06 DIAGNOSIS — C50412 Malignant neoplasm of upper-outer quadrant of left female breast: Secondary | ICD-10-CM | POA: Diagnosis not present

## 2016-12-07 ENCOUNTER — Other Ambulatory Visit (HOSPITAL_BASED_OUTPATIENT_CLINIC_OR_DEPARTMENT_OTHER): Payer: 59

## 2016-12-07 ENCOUNTER — Ambulatory Visit (HOSPITAL_BASED_OUTPATIENT_CLINIC_OR_DEPARTMENT_OTHER): Payer: 59

## 2016-12-07 ENCOUNTER — Ambulatory Visit
Admission: RE | Admit: 2016-12-07 | Discharge: 2016-12-07 | Disposition: A | Discharge status: Home / Self Care | Payer: 59 | Source: Ambulatory Visit | Attending: Radiation Oncology | Admitting: Radiation Oncology

## 2016-12-07 VITALS — BP 121/93 | HR 78 | Temp 99.2°F | Resp 18

## 2016-12-07 DIAGNOSIS — Z5112 Encounter for antineoplastic immunotherapy: Secondary | ICD-10-CM

## 2016-12-07 DIAGNOSIS — Z510 Encounter for antineoplastic radiation therapy: Secondary | ICD-10-CM | POA: Diagnosis not present

## 2016-12-07 DIAGNOSIS — C50412 Malignant neoplasm of upper-outer quadrant of left female breast: Secondary | ICD-10-CM

## 2016-12-07 DIAGNOSIS — Z171 Estrogen receptor negative status [ER-]: Principal | ICD-10-CM

## 2016-12-07 LAB — CBC WITH DIFFERENTIAL/PLATELET
BASO%: 0.2 % (ref 0.0–2.0)
Basophils Absolute: 0 10*3/uL (ref 0.0–0.1)
EOS%: 1 % (ref 0.0–7.0)
Eosinophils Absolute: 0.1 10*3/uL (ref 0.0–0.5)
HCT: 37.6 % (ref 34.8–46.6)
HGB: 12 g/dL (ref 11.6–15.9)
LYMPH%: 25.7 % (ref 14.0–49.7)
MCH: 29.2 pg (ref 25.1–34.0)
MCHC: 31.9 g/dL (ref 31.5–36.0)
MCV: 91.5 fL (ref 79.5–101.0)
MONO#: 0.5 10*3/uL (ref 0.1–0.9)
MONO%: 8.3 % (ref 0.0–14.0)
NEUT#: 3.7 10*3/uL (ref 1.5–6.5)
NEUT%: 64.8 % (ref 38.4–76.8)
Platelets: 196 10*3/uL (ref 145–400)
RBC: 4.11 10*6/uL (ref 3.70–5.45)
RDW: 14.5 % (ref 11.2–14.5)
WBC: 5.8 10*3/uL (ref 3.9–10.3)
lymph#: 1.5 10*3/uL (ref 0.9–3.3)
nRBC: 0 % (ref 0–0)

## 2016-12-07 LAB — COMPREHENSIVE METABOLIC PANEL
ALBUMIN: 3.3 g/dL — AB (ref 3.5–5.0)
ALK PHOS: 75 U/L (ref 40–150)
ALT: 14 U/L (ref 0–55)
AST: 15 U/L (ref 5–34)
Anion Gap: 9 mEq/L (ref 3–11)
BILIRUBIN TOTAL: 0.46 mg/dL (ref 0.20–1.20)
BUN: 22.6 mg/dL (ref 7.0–26.0)
CALCIUM: 9.3 mg/dL (ref 8.4–10.4)
CO2: 26 mEq/L (ref 22–29)
Chloride: 104 mEq/L (ref 98–109)
Creatinine: 0.8 mg/dL (ref 0.6–1.1)
EGFR: >60 mL/min/{1.73_m2} (ref >60)
GLUCOSE: 93 mg/dL (ref 70–140)
Potassium: 3.9 mEq/L (ref 3.5–5.1)
SODIUM: 139 meq/L (ref 136–145)
TOTAL PROTEIN: 6.6 g/dL (ref 6.4–8.3)

## 2016-12-07 MED ORDER — TRASTUZUMAB CHEMO 150 MG IV SOLR
600.0000 mg | Freq: Once | INTRAVENOUS | Status: AC
  Administered 2016-12-07: 600 mg via INTRAVENOUS
  Filled 2016-12-07: qty 28.57

## 2016-12-07 MED ORDER — ACETAMINOPHEN 325 MG PO TABS
650.0000 mg | ORAL_TABLET | Freq: Once | ORAL | Status: AC
  Administered 2016-12-07: 650 mg via ORAL

## 2016-12-07 MED ORDER — SODIUM CHLORIDE 0.9 % IV SOLN
Freq: Once | INTRAVENOUS | Status: AC
  Administered 2016-12-07: 10:00:00 via INTRAVENOUS

## 2016-12-07 MED ORDER — SODIUM CHLORIDE 0.9% FLUSH
10.0000 mL | INTRAVENOUS | Status: DC | PRN
  Administered 2016-12-07: 10 mL
  Filled 2016-12-07: qty 10

## 2016-12-07 MED ORDER — HEPARIN SOD (PORK) LOCK FLUSH 100 UNIT/ML IV SOLN
500.0000 [IU] | Freq: Once | INTRAVENOUS | Status: AC | PRN
  Administered 2016-12-07: 500 [IU]
  Filled 2016-12-07: qty 5

## 2016-12-07 MED ORDER — ACETAMINOPHEN 325 MG PO TABS
ORAL_TABLET | ORAL | Status: AC
  Filled 2016-12-07: qty 2

## 2016-12-07 MED ORDER — SODIUM CHLORIDE 0.9 % IV SOLN
420.0000 mg | Freq: Once | INTRAVENOUS | Status: AC
  Administered 2016-12-07: 420 mg via INTRAVENOUS
  Filled 2016-12-07: qty 14

## 2016-12-07 NOTE — Patient Instructions (Signed)
[facility] Discharge Instructions for Patients Receiving Chemotherapy  Today you received the following chemotherapy agents Herceptin and Perjeta.   To help prevent nausea and vomiting after your treatment, we encourage you to take your nausea medication as directed.   If you develop nausea and vomiting that is not controlled by your nausea medication, call the clinic.   BELOW ARE SYMPTOMS THAT SHOULD BE REPORTED IMMEDIATELY:  *FEVER GREATER THAN 100.5 F  *CHILLS WITH OR WITHOUT FEVER  NAUSEA AND VOMITING THAT IS NOT CONTROLLED WITH YOUR NAUSEA MEDICATION  *UNUSUAL SHORTNESS OF BREATH  *UNUSUAL BRUISING OR BLEEDING  TENDERNESS IN MOUTH AND THROAT WITH OR WITHOUT PRESENCE OF ULCERS  *URINARY PROBLEMS  *BOWEL PROBLEMS  UNUSUAL RASH Items with * indicate a potential emergency and should be followed up as soon as possible.  Feel free to call the clinic should you have any questions or concerns. The clinic phone number is [phone].  Please show the CHEMO ALERT CARD at check-in to the Emergency Department and triage nurse.   

## 2016-12-10 ENCOUNTER — Ambulatory Visit
Admission: RE | Admit: 2016-12-10 | Discharge: 2016-12-10 | Disposition: A | Discharge status: Home / Self Care | Payer: 59 | Source: Ambulatory Visit | Attending: Radiation Oncology | Admitting: Radiation Oncology

## 2016-12-10 DIAGNOSIS — E119 Type 2 diabetes mellitus without complications: Secondary | ICD-10-CM | POA: Diagnosis not present

## 2016-12-10 DIAGNOSIS — Z171 Estrogen receptor negative status [ER-]: Secondary | ICD-10-CM | POA: Diagnosis not present

## 2016-12-10 DIAGNOSIS — Z510 Encounter for antineoplastic radiation therapy: Secondary | ICD-10-CM | POA: Diagnosis not present

## 2016-12-10 DIAGNOSIS — I10 Essential (primary) hypertension: Secondary | ICD-10-CM | POA: Diagnosis not present

## 2016-12-10 DIAGNOSIS — C50412 Malignant neoplasm of upper-outer quadrant of left female breast: Secondary | ICD-10-CM | POA: Diagnosis not present

## 2016-12-11 ENCOUNTER — Ambulatory Visit
Admission: RE | Admit: 2016-12-11 | Discharge: 2016-12-11 | Disposition: A | Discharge status: Home / Self Care | Payer: 59 | Source: Ambulatory Visit | Attending: Radiation Oncology | Admitting: Radiation Oncology

## 2016-12-11 DIAGNOSIS — C50412 Malignant neoplasm of upper-outer quadrant of left female breast: Secondary | ICD-10-CM | POA: Diagnosis not present

## 2016-12-11 DIAGNOSIS — Z510 Encounter for antineoplastic radiation therapy: Secondary | ICD-10-CM | POA: Diagnosis not present

## 2016-12-12 ENCOUNTER — Ambulatory Visit
Admission: RE | Admit: 2016-12-12 | Discharge: 2016-12-12 | Disposition: A | Discharge status: Home / Self Care | Payer: 59 | Source: Ambulatory Visit | Attending: Radiation Oncology | Admitting: Radiation Oncology

## 2016-12-12 DIAGNOSIS — C50412 Malignant neoplasm of upper-outer quadrant of left female breast: Secondary | ICD-10-CM | POA: Diagnosis not present

## 2016-12-12 DIAGNOSIS — Z510 Encounter for antineoplastic radiation therapy: Secondary | ICD-10-CM | POA: Diagnosis not present

## 2016-12-13 ENCOUNTER — Ambulatory Visit
Admission: RE | Admit: 2016-12-13 | Discharge: 2016-12-13 | Disposition: A | Discharge status: Home / Self Care | Payer: 59 | Source: Ambulatory Visit | Attending: Radiation Oncology | Admitting: Radiation Oncology

## 2016-12-13 DIAGNOSIS — C50412 Malignant neoplasm of upper-outer quadrant of left female breast: Secondary | ICD-10-CM | POA: Diagnosis not present

## 2016-12-13 DIAGNOSIS — Z510 Encounter for antineoplastic radiation therapy: Secondary | ICD-10-CM | POA: Diagnosis not present

## 2016-12-14 ENCOUNTER — Other Ambulatory Visit (HOSPITAL_BASED_OUTPATIENT_CLINIC_OR_DEPARTMENT_OTHER): Payer: 59

## 2016-12-14 ENCOUNTER — Telehealth: Payer: Self-pay | Admitting: Hematology

## 2016-12-14 ENCOUNTER — Ambulatory Visit (HOSPITAL_BASED_OUTPATIENT_CLINIC_OR_DEPARTMENT_OTHER): Payer: 59 | Admitting: Hematology

## 2016-12-14 ENCOUNTER — Ambulatory Visit
Admission: RE | Admit: 2016-12-14 | Discharge: 2016-12-14 | Disposition: A | Discharge status: Home / Self Care | Payer: 59 | Source: Ambulatory Visit | Attending: Radiation Oncology | Admitting: Radiation Oncology

## 2016-12-14 ENCOUNTER — Encounter: Payer: Self-pay | Admitting: Hematology

## 2016-12-14 DIAGNOSIS — Z171 Estrogen receptor negative status [ER-]: Principal | ICD-10-CM

## 2016-12-14 DIAGNOSIS — I10 Essential (primary) hypertension: Secondary | ICD-10-CM | POA: Diagnosis not present

## 2016-12-14 DIAGNOSIS — C50412 Malignant neoplasm of upper-outer quadrant of left female breast: Secondary | ICD-10-CM | POA: Diagnosis not present

## 2016-12-14 DIAGNOSIS — K5190 Ulcerative colitis, unspecified, without complications: Secondary | ICD-10-CM

## 2016-12-14 DIAGNOSIS — E876 Hypokalemia: Secondary | ICD-10-CM | POA: Diagnosis not present

## 2016-12-14 DIAGNOSIS — E119 Type 2 diabetes mellitus without complications: Secondary | ICD-10-CM

## 2016-12-14 DIAGNOSIS — G620 Drug-induced polyneuropathy: Secondary | ICD-10-CM

## 2016-12-14 DIAGNOSIS — Z510 Encounter for antineoplastic radiation therapy: Secondary | ICD-10-CM | POA: Diagnosis not present

## 2016-12-14 LAB — CBC WITH DIFFERENTIAL/PLATELET
BASO%: 0.4 % (ref 0.0–2.0)
BASOS ABS: 0 10*3/uL (ref 0.0–0.1)
EOS ABS: 0.1 10*3/uL (ref 0.0–0.5)
EOS%: 1.1 % (ref 0.0–7.0)
HCT: 41.7 % (ref 34.8–46.6)
HGB: 13.3 g/dL (ref 11.6–15.9)
LYMPH%: 26.1 % (ref 14.0–49.7)
MCH: 28.8 pg (ref 25.1–34.0)
MCHC: 31.8 g/dL (ref 31.5–36.0)
MCV: 90.6 fL (ref 79.5–101.0)
MONO#: 0.4 10*3/uL (ref 0.1–0.9)
MONO%: 8.2 % (ref 0.0–14.0)
NEUT#: 3.2 10*3/uL (ref 1.5–6.5)
NEUT%: 64.2 % (ref 38.4–76.8)
PLATELETS: 192 10*3/uL (ref 145–400)
RBC: 4.61 10*6/uL (ref 3.70–5.45)
RDW: 15.4 % — ABNORMAL HIGH (ref 11.2–14.5)
WBC: 5 10*3/uL (ref 3.9–10.3)
lymph#: 1.3 10*3/uL (ref 0.9–3.3)

## 2016-12-14 LAB — COMPREHENSIVE METABOLIC PANEL
ALBUMIN: 3.6 g/dL (ref 3.5–5.0)
ALT: 18 U/L (ref 0–55)
AST: 19 U/L (ref 5–34)
Alkaline Phosphatase: 93 U/L (ref 40–150)
Anion Gap: 11 mEq/L (ref 3–11)
BUN: 12.6 mg/dL (ref 7.0–26.0)
CO2: 28 meq/L (ref 22–29)
Calcium: 9.5 mg/dL (ref 8.4–10.4)
Chloride: 101 mEq/L (ref 98–109)
Creatinine: 0.9 mg/dL (ref 0.6–1.1)
EGFR: >60 mL/min/{1.73_m2} (ref >60)
GLUCOSE: 112 mg/dL (ref 70–140)
POTASSIUM: 3.2 meq/L — AB (ref 3.5–5.1)
SODIUM: 140 meq/L (ref 136–145)
Total Bilirubin: 0.68 mg/dL (ref 0.20–1.20)
Total Protein: 7.1 g/dL (ref 6.4–8.3)

## 2016-12-14 MED ORDER — CAPECITABINE 500 MG PO TABS: ORAL_TABLET | ORAL | 0 refills | Status: DC

## 2016-12-14 NOTE — Progress Notes (Signed)
[place]  [phone]) [phone] Fax:(336) [phone]  Clinic Follow Up Note   Patient Care Team: Orpah Melter, MD as PCP - General (Family Medicine) [name], MD as Consulting Physician (General Surgery) Truitt Merle, MD as Consulting Physician (Hematology) Gery Pray, MD as Consulting Physician (Radiation Oncology) Ladene Artist, MD as Consulting Physician (Gastroenterology) 12/14/2016   CHIEF COMPLAINTS:  Follow up left breast cancer  Oncology History   Cancer Staging Breast cancer of upper-outer quadrant of left female breast [facility]) Staging form: Breast, AJCC 8th Edition - Clinical stage from 04/25/2016: Stage IIA (cT2, cN0, cM0, G3, ER: Negative, PR: Negative, HER2: Positive) - Signed by Truitt Merle, MD on 05/02/2016 - Pathologic stage from 10/09/2016: No Stage Recommended (ypT2, pN1a, cM0, G3, ER: Negative, PR: Negative, HER2: Positive) - Signed by Truitt Merle, MD on 10/26/2016       Breast cancer of upper-outer quadrant of left female breast ([place])   04/24/2016 Mammogram    Category B breasts with a new irregular mass in the UOQ left breast middle depth. Ultrasound revealed a 3.9 cm mass in the 1:00 position. The left axilla was negative.       04/25/2016 Initial Biopsy    Biopsy of the left breast showed grade 3 invasive ductal carcinoma, DCIS, and lymphovascular invasion was present.      04/25/2016 Receptors her2    ER 0% negative, PR 0% negative, HER2 positive, Ki67 30%      05/02/2016 Initial Diagnosis    Breast cancer of upper-outer quadrant of left female breast ([place])      05/07/2016 Imaging    MRI of the bilateral breast 05/07/16 IMPRESSION: Lobulated enhancing mass (4.0 x 2.7 x 3.1 cm) in the upper-outer quadrant of the left breast corresponding with the recently diagnosed invasive mammary carcinoma. Linear enhancement extends 2.6 cm posterior to the mass worrisome for ductal carcinoma in-situ.      05/09/2016 Echocardiogram    Echo  05/09/16 -LF EF: 55-60%      05/11/2016 - 08/31/2016 Neo-Adjuvant Chemotherapy    Cycle 1 Docetaxel, Carboplatin, Herceptin and pejeta (TCHP), cycle 2 Docetaxel changed to Taxol, carbo dose reduced to AUC 4.5 (from 5) due to severe diarrhea and cytopenia, Perjeta held after cycle 2.   Due to poor toleration, [name] was held after cycle 4 and Taxol changed to weekly X4. Herceptin was continued during the above chemo.        05/13/2016 Imaging    CT Angio Chest PE IMPRESSION: No evidence of pulmonary emboli. Left breast mass consistent with the given clinical history. Stable left adrenal lesion likely representing a small adenoma.      05/18/2016 - 05/24/2016 Hospital Admission    Patient presented with nausea, vomiting, and diarrhea; admitted to hospital with Hyponatremia      06/07/2016 - 06/09/2016 Hospital Admission    Patient presents to hospital complaints of rectal bleeding and abdominal cramps when defacating      06/07/2016 Imaging    CT ABDOMEN PELVIS W CONTRAST  IMPRESSION: 1. Wall thickening of the descending and sigmoid colon consistent with an infectious or inflammatory colitis. 2. There is also a short segment of narrowing and possible wall thickening of the superior ascending colon which could reflect a constricting mass or, more likely, be an area of persistent colonic spasm. This could be further assessed with either colonoscopy or a barium enema after the current symptoms of colitis have resolved. 3. Small, subcentimeter, low-density liver lesions which may all be benign.  However, 3 these are not evident on prior CT. Liver metastatic disease possible. These could be further assessed with liver MRI with and without contrast. 4. Adrenal lesions which are stable, on the right and myelolipoma and on the left most likely an adenoma.      06/07/2016 Imaging    CT A/P IMPRESSION: 1. Wall thickening of the descending and sigmoid colon consistent with an infectious or  inflammatory colitis. 2. There is also a short segment of narrowing and possible wall thickening of the superior ascending colon which could reflect a constricting mass or, more likely, be an area of persistent colonic spasm. This could be further assessed with either colonoscopy or a barium enema after the current symptoms of colitis have resolved. 3. Small, subcentimeter, low-density liver lesions which may all be benign. However, 3 these are not evident on prior CT. Liver metastatic disease possible. These could be further assessed with liver MRI with and without contrast. 4. Adrenal lesions which are stable, on the right and myelolipoma and on the left most likely an adenoma.      06/07/2016 - 06/09/2016 Hospital Admission    Diarrhea and rectal Bleeding      07/19/2016 - 07/20/2016 Hospital Admission    Diarrhea and dehydration      08/09/2016 Mammogram    Mammogram 08/09/16 IMPRESSION:  The 3.1 cm x 2 cm x 1.5 cm irregular equal density mass in the left breast is consistent with the known carcinoma showing mammographic evidence of preoperative chemotherapy response.         08/09/2016 Imaging    [place] of left breast 08/09/16 IMPRESSION:  The 2.7 cm lobulated mass in the left breast is a known biopsy positive for malignancy. Surgical and oncology consult in progress.      08/16/2016 Imaging    MRI Abdomen W WO Contrast IMPRESSION: Tiny sub-cm hepatic cysts. No evidence of metastatic disease or other acute findings. Tiny benign left adrenal adenoma and right adrenal myelolipoma.      09/14/2016 -  Chemotherapy    Maintenance Herceptin every 3 weeks. Added Perjeta back on 10/26/16      09/17/2016 Imaging    MRI Breast Bilateral 09/17/16 IMPRESSION: Smaller left breast mass, now measuring 2.5 cm. No new or suspicious enhancement in either breast.  RECOMMENDATION: Treatment plan.      10/09/2016 Surgery    LEFT BREAST LUMPECTOMY WITH RADIOACTIVE SEED AND L4EFT AXILLARY  SENTINEL LYMPH NODE BIOPSY by Dr. Lucia Gaskins on 10/09/16      10/09/2016 Pathology Results    Diagnosis 10/09/16 1. Breast, lumpectomy, Left - INVASIVE DUCTAL CARCINOMA, GRADE 3, SPANNING 2.4 CM. - HIGH GRADE DUCTAL CARCINOMA IN SITU WITH NECROSIS. - RESECTION MARGINS ARE NEGATIVE FOR CARCINOMA. - BIOPSY SITE. - SEE ONCOLOGY TABLE. 2. Breast, excision, Left additional medial margin - FIBROCYSTIC CHANGE. - NO MALIGNANCY IDENTIFIED. 3. Lymph node, sentinel, biopsy, Left Axillary - METASTATIC CARCINOMA IN ONE OF ONE LYMPH NODES (1/1).        11/22/2016 -  Radiation Therapy    Radiation with Dr. Sondra Come, plan to complete on 01/08/17      11/22/2016 -  Chemotherapy    Xeloda With radiation she will be on low dose 4 tablets in the am and 3 tablets in the pm. Starting 11/22/16 and plan to complete on 01/08/17. Without radiation she will be on full dose Xeloda, 14 days on and 7 days off, 4 tablets BID.        HISTORY OF PRESENTING ILLNESS (  05/02/16):  Brandi Dickson 56 y.o. female is here because of a new diagnosis of left breast cancer. She is accompanied by her husband to our multidisciplinary breast clinic today.  The patient presented with a palpable left breast lump approximately 2 weeks ago. Bilateral diagnostic mammogram on 04/24/16 showed Category B breasts with a new irregular mass in the UOQ left breast middle depth. Ultrasound performed on 04/24/16 revealed a 3.9 cm mass in the 1:00 position. The left axilla was negative.  Biopsy of the left breast on 04/25/16 showed grade 3 invasive ductal carcinoma, DCIS, and lymphovascular invasion was present (ER 0% negative, PR 0% negative, HER2 positive, Ki67 30%).  She denies tenderness of the biopsied area. Reports minor dimpling. The patient is 8 weeks out from a left hip replacement. She had a right hip replacement 2010 years ago.  The patient and her husband present today in multidisciplinary breast clinic to discuss treatment options for  the management of her disease.  The patient is taking Neurontin for neuropathy in her feet from diabetes. She has been diagnosed for diabetes for the past 4 years. She states she only has neuropathy at night and take 2 Neurontin at night and then has no symptoms.  GYN HISTORY  Menarchal: 9 LMP: Complete hysterectomy for uterine fibroids ~ 2009. She was bleeding too much with her last menstrual cycle lasting 6 weeks. No issues with menopause. Contraceptive: no  HRT: No GP: G1P0  CURRENT THERAPY:  1. Maintenance Herceptin every 3 weeks, started on 06/01/16. Add Perjeta back starting 10/26/16. 2. Xeloda With radiation she will be on 4 tablets in the am and 3 tablets in the pm. Starting 11/22/16 and plan to complete on 01/08/17. Without radiation she will be on full dose Xeloda, 14 days on and 7 days off, 4 tablets BID.    INTERVAL HISTORY:  Brandi Dickson returns for follow up. She presents to the clinic today noting she is doing well with Radiation. She notes no significant side effects. She plans to go to the beach for her holidays. She needs a refill of Xeloda. She does have mild diarrhea with 2-3 BM a day and takes imodium as needed. She already had her radiation for the day. She notes she has significant cancer including breast cancer in her family. She had denied genetic testing previously. She has no children.    MEDICAL HISTORY:  Past Medical History:  Diagnosis Date  . Arthritis   . Asthma    triggered with [name] perfumes and cigarette smoke  . Cancer ([place])   . Colitis   . Diabetes mellitus without complication ([place])   . Hypertension   . Neuropathy     SURGICAL HISTORY: Past Surgical History:  Procedure Laterality Date  . ABDOMINAL HYSTERECTOMY    . DILATION AND CURETTAGE OF UTERUS    . INSERTION PORT-A-CATH WITH [place] Right 05/08/2016   Performed by [name], MD at [facility] ARTHROSCOPY Left   . LEFT BREAST LUMPECTOMY WITH RADIOACTIVE  SEED AND L4EFT AXILLARY SENTINEL LYMPH NODE BIOPSY Left 10/09/2016   Performed by [name], MD at [facility]  . LEFT TOTAL HIP ARTHROPLASTY ANTERIOR APPROACH Left 03/06/2016   Performed by [name], MD at [facility] ORS  . TONSILLECTOMY    . TOTAL HIP ARTHROPLASTY Right     SOCIAL HISTORY: Social History   Socioeconomic History  . Marital status: Married    Spouse name: Not on file  .  Number of children: 0  . Years of education: Not on file  . Highest education level: Not on file  Social Needs  . Financial resource strain: Not on file  . Food insecurity - worry: Not on file  . Food insecurity - inability: Not on file  . Transportation needs - medical: Not on file  . Transportation needs - non-medical: Not on file  Occupational History  . Not on file  Tobacco Use  . Smoking status: Former Smoker    Packs/day: 1.00    Years: 29.00    Pack years: 29.00    Types: Cigarettes    Last attempt to quit: 10/27/2002    Years since quitting: 14.1  . Smokeless tobacco: Never Used  Substance and Sexual Activity  . Alcohol use: No    Alcohol/week: 0.0 oz  . Drug use: No  . Sexual activity: Yes    Birth control/protection: Surgical  Other Topics Concern  . Not on file  Social History Narrative  . Not on file    FAMILY HISTORY: Family History  Problem Relation Age of Onset  . Colon cancer Father   . Stomach cancer Paternal Uncle   . Stomach cancer Paternal Uncle   . Melanoma Brother   . Thyroid cancer Brother   . Breast cancer Maternal Aunt   . Breast cancer Maternal Aunt   . Breast cancer Cousin     ALLERGIES:  is allergic to lisinopril; augmentin [amoxicillin-pot clavulanate]; benadryl [diphenhydramine]; and losartan potassium.  MEDICATIONS:  Current Outpatient Medications  Medication Sig Dispense Refill  . capecitabine (XELODA) 500 MG tablet Take 4 tabs (2060m) by mouth in AM & 3 tabs (15050m in PM, on days of radiation only (Mon-Fri). Take with water  a after meal. 150 tablet 0  . diazepam (VALIUM) 5 MG tablet Take 1 tablet (5 mg total) by mouth as directed. Take 5 mg by mouth 30 minutes prior to MRI procedure.   May repeat x 1. DO NOT Drive after taking Valium as it may cause drowsiness. 2 tablet 0  . dicyclomine (BENTYL) 20 MG tablet Take 1 tablet (20 mg total) by mouth 4 (four) times daily -  before meals and at bedtime. 30 tablet 0  . diphenoxylate-atropine (LOMOTIL) 2.5-0.025 MG tablet Take one to two tablets every six hours as needed for diarrhea 120 tablet 1  . ferrous sulfate 325 (65 FE) MG tablet Take 325 mg by mouth 2 (two) times daily with a meal.    . gabapentin (NEURONTIN) 100 MG capsule Take 200 mg by mouth at bedtime.     . Homeopathic Products (ZICAM ALLERGY RELIEF NA) Take 1 tablet by mouth daily.    . [name] Kitchenidocaine-prilocaine (EMLA) cream Apply 1 application to skin 1.5 to 2 hrs before use.  Cover to secure cream with plastic wrap. 30 g PRN  . loperamide (IMODIUM) 1 MG/5ML solution Take 3 mg by mouth as needed for diarrhea or loose stools.    . magnesium oxide (MAG-OX) 400 MG tablet Take 400 mg by mouth daily.     . Mesalamine 800 MG TBEC Take 1,600 mg by mouth 2 (two) times daily.    . metFORMIN (GLUCOPHAGE-XR) 500 MG 24 hr tablet Take 1,000 mg by mouth at bedtime.    . metoprolol (LOPRESSOR) 50 MG tablet Take 50 mg by mouth 2 (two) times daily. Takes 1 in the am and 1 at HS    . niacin 250 MG tablet Take 250 mg by mouth at bedtime.     .[name] Kitchen  potassium chloride SA (K-DUR,KLOR-CON) 20 MEQ tablet Take 1 tablet (20 mEq total) by mouth 2 (two) times daily. 60 tablet 2  . traMADol (ULTRAM) 50 MG tablet Take 1-2 tablets (50-100 mg total) by mouth every 6 (six) hours as needed. 40 tablet 0  . triamterene-hydrochlorothiazide (MAXZIDE) 75-50 MG per tablet Take 0.5 tablets by mouth daily.     [name] Kitchen albuterol (PROVENTIL HFA;VENTOLIN HFA) 108 (90 Base) MCG/ACT inhaler Inhale 1-2 puffs into the lungs every 6 (six) hours as needed for wheezing or  shortness of breath.    [name] Kitchen HYDROcodone-acetaminophen (NORCO/VICODIN) 5-325 MG tablet Take 1-2 tablets by mouth every 6 (six) hours as needed for moderate pain. (Patient not taking: Reported on 12/14/2016) 20 tablet 0  . hydrocortisone (ANUSOL-HC) 25 MG suppository Place 1 suppository (25 mg total) rectally 2 (two) times daily. (Patient not taking: Reported on 12/14/2016) 12 suppository 0  . LORazepam (ATIVAN) 0.5 MG tablet Take 1 tablet (0.5 mg total) by mouth once as needed for anxiety. (Patient not taking: Reported on 11/21/2016) 2 tablet 0  . ondansetron (ZOFRAN) 8 MG tablet Take 1 tablet (8 mg total) by mouth 2 (two) times daily as needed for refractory nausea / vomiting. Start on day 3 after chemo. (Patient not taking: Reported on 11/21/2016) 30 tablet 1  . prochlorperazine (COMPAZINE) 10 MG tablet Take 1 tablet (10 mg total) by mouth every 6 (six) hours as needed (Nausea or vomiting). (Patient not taking: Reported on 11/21/2016) 30 tablet 1   No current facility-administered medications for this visit.    Facility-Administered Medications Ordered in Other Visits  Medication Dose Route Frequency Provider Last Rate Last Dose  . heparin lock flush 100 unit/mL  500 Units Intracatheter Once PRN Truitt Merle, MD      . sodium chloride flush (NS) 0.9 % injection 10 mL  10 mL Intracatheter PRN Truitt Merle, MD      . sodium chloride flush (NS) 0.9 % injection 10 mL  10 mL Intracatheter PRN Truitt Merle, MD   10 mL at 08/24/16 1725  . sodium chloride flush (NS) 0.9 % injection 10 mL  10 mL Intracatheter PRN Truitt Merle, MD   10 mL at 11/16/16 1628   REVIEW OF SYSTEMS:   Constitutional: Denies fevers, chills or abnormal night sweats  Eyes: Denies blurriness of vision, double vision or watery eyes Ears, nose, mouth, throat, and face: Denies mucositis or sore throat Respiratory: Denies cough, dyspnea or wheezes Cardiovascular: Denies palpitation.  Gastrointestinal:  Denies heartburn. Denies nausea, vomiting. (+)  mild diarrhea, manageable  Skin: Denies abnormal skin rashes Lymphatics: Denies new lymphadenopathy or easy bruising Neurological: (+) neuropathy in hands/feet, unchanged Behavioral/Psych: Mood is stable, no new changes  All other systems were reviewed with the patient and are negative.   PHYSICAL EXAMINATION:  ECOG PERFORMANCE STATUS: 1 - Symptomatic but completely ambulatory  Vitals:   12/14/16 1032  BP: (!) 142/64  Pulse: 80  Resp: 18  Temp: 97.7 F (36.5 C)  SpO2: 100%   Filed Weights   12/14/16 1032  Weight: 222 lb 14.4 oz (101.1 kg)     GENERAL:alert, no distress and comfortable SKIN: skin color, texture, turgor are normal, no rashes or significant lesions EYES: normal, conjunctiva are pink and non-injected, sclera clear OROPHARYNX:no exudate, no erythema and lips, buccal mucosa, and tongue normal  NECK: supple, thyroid normal size, non-tender, without nodularity LYMPH:  no palpable lymphadenopathy in the cervical, axillary or inguinal LUNGS: clear to auscultation and percussion with normal breathing  effort HEART: regular rate & rhythm and no murmurs and no lower extremity edema ABDOMEN:abdomen soft, non-tender and normal bowel sounds Musculoskeletal:no cyanosis of digits and no clubbing  PSYCH: alert & oriented x 3 with fluent speech NEURO: no focal motor/sensory deficits BREAST: (+) s/p lumpectomy inc in UOQ of left breast, healing well, no discharge, palpable mass or scar. Skin hyperpigmentation on left breast from radiation, no skin ulcers or skin breakdown.   LABORATORY DATA:  I have reviewed the data as listed CBC Latest Ref Rng & Units 12/14/2016 12/07/2016 11/30/2016  WBC 3.9 - 10.3 10e3/uL 5.0 5.8 5.5  Hemoglobin 11.6 - 15.9 g/dL 13.3 12.0 12.6  Hematocrit 34.8 - 46.6 % 41.7 37.6 39.1  Platelets 145 - 400 10e3/uL 192 196 203   CMP Latest Ref Rng & Units 12/14/2016 12/07/2016 11/30/2016  Glucose 70 - 140 mg/dl 112 93 95  BUN 7.0 - 26.0 mg/dL 12.6 22.6 17.5    Creatinine 0.6 - 1.1 mg/dL 0.9 0.8 0.8  Sodium 136 - 145 mEq/L 140 139 141  Potassium 3.5 - 5.1 mEq/L 3.2(L) 3.9 3.1(L)  Chloride 101 - 111 mmol/L - - -  CO2 22 - 29 mEq/L 28 26 29   Calcium 8.4 - 10.4 mg/dL 9.5 9.3 9.4  Total Protein 6.4 - 8.3 g/dL 7.1 6.6 6.8  Total Bilirubin 0.20 - 1.20 mg/dL 0.68 0.46 0.47  Alkaline Phos 40 - 150 U/L 93 75 74  AST 5 - 34 U/L 19 15 22   ALT 0 - 55 U/L 18 14 16    PATHOLOGY REPORT:    Diagnosis 10/09/16 1. Breast, lumpectomy, Left - INVASIVE DUCTAL CARCINOMA, GRADE 3, SPANNING 2.4 CM. - HIGH GRADE DUCTAL CARCINOMA IN SITU WITH NECROSIS. - RESECTION MARGINS ARE NEGATIVE FOR CARCINOMA. - BIOPSY SITE. - SEE ONCOLOGY TABLE. 2. Breast, excision, Left additional medial margin - FIBROCYSTIC CHANGE. - NO MALIGNANCY IDENTIFIED. 3. Lymph node, sentinel, biopsy, Left Axillary - METASTATIC CARCINOMA IN ONE OF ONE LYMPH NODES (1/1). Microscopic Comment 1. BREAST, STATUS POST NEOADJUVANT TREATMENT Procedure: Left lumpectomy with additional medial margin excision, left axillary sentinel lymph node biopsy. Laterality: Left. Tumor Size: 2.4 cm. Histologic Type: Invasive ductal carcinoma. Grade: 3 Tubular Differentiation: 3 Nuclear Pleomorphism: 3 Mitotic Count: 3 Ductal Carcinoma in Situ (DCIS): Present, high grade with necrosis. Regional Lymph Nodes: Number of Lymph Nodes Examined: 1 Number of Sentinel Lymph Nodes Examined: 1 Lymph Nodes with Macrometastases: 1 Lymph Nodes with Micrometastases: 0 Lymph Nodes with Isolated Tumor Cells: 0 Margins: Invasive carcinoma, distance from closest margin: >0.5 cm all original margins. DCIS, distance from closest margin: >0.5 cm all original margins. Extent of Tumor: Confined to breast parenchyma. Breast Prognostic Profile (pre-neoadjuvant case #: [id number], see below). Estrogen Receptor: Negative. Progesterone Receptor: Negative. Her2: Positive(ratio 2.25). Ki-67: 30% 2 of 4 FINAL for Blust, Myli P  ([id number]) Microscopic Comment(continued) Will be repeated on the current case (Block #: [id number]) and the results reported separately. Residual Cancer Burden (RCB): Primary Tumor Bed: 24 mm x 20 mm Overall Cancer Cellularity: 75% Percentage of Cancer that is in Situ: 5% Number of Positive Lymph Nodes: 1 Diameter of Largest Lymph Node metastasis: 3 mm Residual Cancer Burden : 3.439 Residual Cancer Burden Class: RCB-III Pathologic Stage Classification (p TNM, AJCC 8th Edition): Primary Tumor (ypT): ypT2 Regional Lymph Nodes (ypN): ypN1a 1. PROGNOSTIC INDICATORS Results: IMMUNOHISTOCHEMICAL AND MORPHOMETRIC ANALYSIS PERFORMED MANUALLY Estrogen Receptor: 30%, POSITIVE, WEAK STAINING INTENSITY Progesterone Receptor: 0%, NEGATIVE COMMENT: The negative hormone receptor study(ies) in this case has an  internal positive control. REFERENCE RANGE ESTROGEN RECEPTOR NEGATIVE 0% POSITIVE =>1% REFERENCE RANGE PROGESTERONE RECEPTOR NEGATIVE 0% POSITIVE =>1% All controls stained appropriately Enid Cutter MD Pathologist, Electronic Signature ( Signed 10/16/2016) 1. FLUORESCENCE IN-SITU HYBRIDIZATION Results: HER2 - **POSITIVE** RATIO OF HER2/CEP17 SIGNALS 2.67 AVERAGE HER2 COPY NUMBER PER CELL 4.00 Reference Range: NEGATIVE HER2/CEP17 Ratio <2.0 and average HER2 copy number <4.0 L DIAG NOSIS Diagnosis 04/25/16 Breast, left, needle core biopsy - INVASIVE DUCTAL CARCINOMA, SEE COMMENT. - DUCTAL CARCINOMA IN SITU. - LYMPHOVASCULAR INVASION PRESENT. Microscopic Comment The carcinoma appears grade 3 with focal squamous differentiation. Prognostic markers will be ordered. Dr. Lyndon Code has reviewed the case. The case was called to Dr. Isaiah Blakes on 04/26/2016. Vicente Males MD Pathologist, Electronic Signature (Case signed 04/26/2016) ADDITIONAL INFORMATION: 04/25/16 PROGNOSTIC INDICATORS Results: IMMUNOHISTOCHEMICAL AND MORPHOMETRIC ANALYSIS PERFORMED MANUALLY Estrogen Receptor: 0%,  NEGATIVE Progesterone Receptor: 0%, NEGATIVE Proliferation Marker Ki67: 30% COMMENT: The negative hormone receptor study(ies) in this case has no internal positive control. REFERENCE RANGE ESTROGEN RECEPTOR NEGATIVE 0% POSITIVE =>1% REFERENCE RANGE PROGESTERONE RECEPTOR NEGATIVE 0% POSITIVE =>1% All controls stained appropriately Enid Cutter MD Pathologist, Electronic Signature ( Signed 05/01/2016) FLUORESCENCE IN-SITU HYBRIDIZATION Results: HER2 - **POSITIVE** RATIO OF HER2/CEP17 SIGNALS 2.25 AVERAGE HER2 COPY NUMBER PER CELL 8.45 1 of 3 FINAL for Niebuhr, Christmas P ([id number]) ADDITIONAL INFORMATION:(continued) Reference Range: NEGATIVE HER2/CEP17 Ratio <2.0 and average HER2 copy number <4.0 EQUIVOCAL HER2/CEP17 Ratio <2.0 and average HER2 copy number 4.0 and <6.0 POSITIVE HER2/CEP17 Ratio >=2.0 or <2.0 and average HER2 copy number >=6.0 Enid Cutter MD Pathologist, Electronic Signature ( Signed 04/30/2016)   PROCEDURES  ECHO 11/21/16  Impressions: - Normal LV size and systolic function, EF 64-33%. Normal diastolic   function. Global longitudinal strain less negative than prior but   images not as clean. Normal RV size and systolic function.   RADIOGRAPHIC STUDIES: I have personally reviewed the radiological images as listed and agreed with the findings in the report.   MRI Breast Bilateral 09/17/16 IMPRESSION: Smaller left breast mass, now measuring 2.5 cm. No new or suspicious enhancement in either breast. RECOMMENDATION: Treatment plan.  MRI Abdomen W WO Contrast 08/16/16 IMPRESSION: Tiny sub-cm hepatic cysts. No evidence of metastatic disease or other acute findings. Tiny benign left adrenal adenoma and right adrenal myelolipoma.  Mammogram 08/09/16 IMPRESSION:  The 3.1 cm x 2 cm x 1.5 cm irregular equal density mass in the left breast is consistent with the known carcinoma showing mammographic evidence of preoperative chemotherapy response.   [place] of  left breast 08/09/16 IMPRESSION:  The 2.7 cm lobulated mass in the left breast is a known biopsy positive for malignancy. Surgical and oncology consult in progress.   See onc history CT ABD/Pelvi 5/10/18s W CONTRAST 06/07/16 IMPRESSION: 1. Wall thickening of the descending and sigmoid colon consistent with an infectious or inflammatory colitis. 2. There is also a short segment of narrowing and possible wall thickening of the superior ascending colon which could reflect a constricting mass or, more likely, be an area of persistent colonic spasm. This could be further assessed with either colonoscopy or a barium enema after the current symptoms of colitis have resolved. 3. Small, subcentimeter, low-density liver lesions which may all be benign. However, 3 these are not evident on prior CT. Liver metastatic disease possible. These could be further assessed with liver MRI with and without contrast. 4. Adrenal lesions which are stable, on the right and myelolipoma and on the left most likely an adenoma.  CT Angio Chest PE  W and/or wo Contrast 05/13/16 IMPRESSION: No evidence of pulmonary emboli.  Echo 05/09/16 Study Conclusions -LF EF: 55-60% - Left ventricle: The cavity size was normal. There was mild focal   basal hypertrophy of the septum. Indeterminant diastolic   function. Systolic function was normal. The estimated ejection   fraction was in the range of 55% to 60%. Wall motion was normal;   there were no regional wall motion abnormalities. GLS abnormal at   -13.3%, poor images however.  ASSESSMENT & PLAN: 56 y.o. post-menopausal Caucasian female with a self palpated left breast mass.  1. Breast cancer of upper-outer quadrant of left breast, invasive ductal carcinoma,  stage IIA ([id number]) grade 3, ER-, PR-, HER2 amplified -We previously reviewed the patient's imaging and pathology. -We previously reviewed her staging and biology of her breast cancer  -We previously discussed that  surgical resection is the definitive treatment for breast cancer, she was seen by breast surgeon Dr. Lucia Gaskins today, lumpectomy versus mastectomy were discussed with patient. -We previously discussed HER2 positive breast cancers total approximately 15% of breast cancers and happens to be more aggressive than HER2 negative cancers, especially ER and PR negative disease, she has high likelihood of cancer recurrence after complete surgical resection.  -I recommend neoadjuvant chemotherapy TCHP (docetaxel, carboplatin, Herceptin and pejeta) every 3 weeks for 6 cycles, followed by maintenance Herceptin and pejeta to complete 1 year therapy -Her baseline echo was normal, she was seen by cardiologist Dr. Benjamine Mola -She unfortunately developed severe diarrhea and cytopenia after chemotherapy, and was hospitalized for several times.  -Chemotherapy was subsequently suggested and dose reduced, and stopped after 4 months treatment. -She underwent left breast lumpectomy on 10/09/16 and the pathology results show significant residual disease. -she will start adjuvant radiation since her removed lymph node was positive.  -Due to her residual cancer and positive lymph node I also recommend adjuvant chemo Xeloda for 6 months.  She started Xeloda with concurrent radiation, tolerating well so far.  We will continue -Due to her significant residual disease after neoadjuvant chemotherapy, I recommend her to continue Herceptin and pejeta maintenance therapy, to complete a total of one year therapy.  -Echo 11/21/16 LVEF 33-29%, normal systolic and diastolic function -She started adjuvant chemoradiation with Xeloda on 11/22/16 and plans to complete radiation on 01/08/17. She has experienced diarrhea with Xeloda, managed with imodium and lomotil.  -CBC reviewed today and WNL, will continue monitor her lab weekly when she is on chemo and radiation -F/u on 11/28    2. Genetics -Given her strong family history of breast cancer, we  recommend her to see genetic counseling to ruled out inheritable breast cancer syndrome. She agreed. -Genetic counseling scheduled but she has not been seen, I referred her again  -She has not children. -She declined genetic testing at this time.  -She knows to continue cancer screenings.   3. Type 2 Diabetes mellitus, HTN -Managed by her PCP. -The patient has peripheral neuropathy in her feet from her diabetes. The patient is already on Neurontin with 200 mg at night. We previously discussed that chemotherapy may make her neuropathy worse. -Steroids will be given to reduce chemo side effects and I will reduce dexa to 19m daily to not affect her blood sugar much. -We'll monitor her blood glucose and blood pressure closely during her chemotherapy treatment.  4. Ulcerative colitis -We previously discussed that chemotherapy would cause diarrhea and the patient's colitis may exacerbate during chemo  -She is not taking steroids for this. She is  on mesalamine, will follow up with Dr. Fuller Plan  -She had severe diarrhea after first cycle chemotherapy, was treated for ulcerative colitis flare -I previously advised the patient to keep herself adequately hydrated and to take Imodium PRN. - I again previously advised the patient to take Lomotil and Imodium frequently as needed. -Since change in chemo her diarrhea has resolved.    5. Arthritis  -s/p b/l hip replacement, last surgery in February 2018.  -Tylenol every 6-8 hours as needed was recommended.  -I encouraged her to f/u with PCP for arthritic pain management   6. Peripheral Neuropathy, secondary to chemotherapy -primarily typing and her neuropathy is worse following bouts of typing. I encouraged her to use her left hand more often and begin wearing cotton gloves.  -Encouraged taking B12 multivitamin to aid with this as well.  -She is currently on Gabapentin for her baseline neuropathy associated w/ her DM, she currently takes this TID.  -She  also began vitamin B12 in combination with her Gabapentin. She also reports that she has been taking all three tablets of Gabapentin at night to aid with sleep. This has been helping her with sleep, but her neuropathy has still persisted throughout the day. -She has worsening neuropathy with mild to moderate decreased vibratory sense in hands/feet that is worse at night -she takes 200 mg Neurontin at night, may increase to 300-400 mg at bedtime   7. Hypokalemia  secondary to chemotherapy  -10/26/16 labs showed a 3.0 potassium, she increased potassium to BID for the next 5 days then back to once daily   -She now takes potassium BID, K 3.2 today    PLAN -Refill Xeloda, she will continue Xeloda and radiation -Lab, flush, and f/u, herceptin and perjeta on 11/28  -lab weekly    No orders of the defined types were placed in this encounter.  All questions were answered. The patient knows to call the clinic with any problems, questions or concerns.  I spent 25 minutes counseling the patient face to face. The total time spent in the appointment was 30 minutes and more than 50% was on counseling.  This document serves as a record of services personally performed by Truitt Merle, MD. It was created on her behalf by Joslyn Devon, a trained medical scribe. The creation of this record is based on the scribe's personal observations and the provider's statements to them.    I have reviewed the above documentation for accuracy and completeness, and I agree with the above.    Truitt Merle, MD 12/14/2016

## 2016-12-14 NOTE — Telephone Encounter (Signed)
Gave avs and calendar for December however I am waiting on cindy for 11/28

## 2016-12-16 ENCOUNTER — Ambulatory Visit
Admission: RE | Admit: 2016-12-16 | Discharge: 2016-12-16 | Disposition: A | Discharge status: Home / Self Care | Payer: 59 | Source: Ambulatory Visit | Attending: Radiation Oncology | Admitting: Radiation Oncology

## 2016-12-16 DIAGNOSIS — Z510 Encounter for antineoplastic radiation therapy: Secondary | ICD-10-CM | POA: Diagnosis not present

## 2016-12-16 DIAGNOSIS — C50412 Malignant neoplasm of upper-outer quadrant of left female breast: Secondary | ICD-10-CM | POA: Diagnosis not present

## 2016-12-17 ENCOUNTER — Ambulatory Visit
Admission: RE | Admit: 2016-12-17 | Discharge: 2016-12-17 | Disposition: A | Discharge status: Home / Self Care | Payer: 59 | Source: Ambulatory Visit | Attending: Radiation Oncology | Admitting: Radiation Oncology

## 2016-12-17 DIAGNOSIS — C50412 Malignant neoplasm of upper-outer quadrant of left female breast: Secondary | ICD-10-CM | POA: Diagnosis not present

## 2016-12-17 DIAGNOSIS — Z510 Encounter for antineoplastic radiation therapy: Secondary | ICD-10-CM | POA: Diagnosis not present

## 2016-12-18 ENCOUNTER — Ambulatory Visit
Admission: RE | Admit: 2016-12-18 | Discharge: 2016-12-18 | Disposition: A | Discharge status: Home / Self Care | Payer: 59 | Source: Ambulatory Visit | Attending: Radiation Oncology | Admitting: Radiation Oncology

## 2016-12-18 DIAGNOSIS — Z510 Encounter for antineoplastic radiation therapy: Secondary | ICD-10-CM | POA: Diagnosis not present

## 2016-12-18 DIAGNOSIS — C50412 Malignant neoplasm of upper-outer quadrant of left female breast: Secondary | ICD-10-CM | POA: Diagnosis not present

## 2016-12-19 ENCOUNTER — Ambulatory Visit
Admission: RE | Admit: 2016-12-19 | Discharge: 2016-12-19 | Disposition: A | Discharge status: Home / Self Care | Payer: 59 | Source: Ambulatory Visit | Attending: Radiation Oncology | Admitting: Radiation Oncology

## 2016-12-19 DIAGNOSIS — C50412 Malignant neoplasm of upper-outer quadrant of left female breast: Secondary | ICD-10-CM | POA: Diagnosis not present

## 2016-12-19 DIAGNOSIS — Z452 Encounter for adjustment and management of vascular access device: Secondary | ICD-10-CM | POA: Diagnosis not present

## 2016-12-19 DIAGNOSIS — Z510 Encounter for antineoplastic radiation therapy: Secondary | ICD-10-CM | POA: Diagnosis not present

## 2016-12-21 ENCOUNTER — Ambulatory Visit (HOSPITAL_BASED_OUTPATIENT_CLINIC_OR_DEPARTMENT_OTHER): Payer: 59

## 2016-12-21 ENCOUNTER — Telehealth: Payer: Self-pay | Admitting: Hematology

## 2016-12-21 ENCOUNTER — Other Ambulatory Visit (HOSPITAL_BASED_OUTPATIENT_CLINIC_OR_DEPARTMENT_OTHER): Payer: 59

## 2016-12-21 DIAGNOSIS — Z452 Encounter for adjustment and management of vascular access device: Secondary | ICD-10-CM | POA: Diagnosis not present

## 2016-12-21 DIAGNOSIS — Z171 Estrogen receptor negative status [ER-]: Secondary | ICD-10-CM

## 2016-12-21 DIAGNOSIS — C50412 Malignant neoplasm of upper-outer quadrant of left female breast: Secondary | ICD-10-CM | POA: Diagnosis not present

## 2016-12-21 DIAGNOSIS — Z510 Encounter for antineoplastic radiation therapy: Secondary | ICD-10-CM | POA: Diagnosis not present

## 2016-12-21 DIAGNOSIS — Z95828 Presence of other vascular implants and grafts: Secondary | ICD-10-CM

## 2016-12-21 LAB — COMPREHENSIVE METABOLIC PANEL
ALT: 14 U/L (ref 0–55)
AST: 14 U/L (ref 5–34)
Albumin: 3.4 g/dL — ABNORMAL LOW (ref 3.5–5.0)
Alkaline Phosphatase: 84 U/L (ref 40–150)
Anion Gap: 11 mEq/L (ref 3–11)
BILIRUBIN TOTAL: 0.54 mg/dL (ref 0.20–1.20)
BUN: 11.8 mg/dL (ref 7.0–26.0)
CO2: 27 meq/L (ref 22–29)
CREATININE: 0.8 mg/dL (ref 0.6–1.1)
Calcium: 9.2 mg/dL (ref 8.4–10.4)
Chloride: 103 mEq/L (ref 98–109)
GLUCOSE: 101 mg/dL (ref 70–140)
Potassium: 3.1 mEq/L — ABNORMAL LOW (ref 3.5–5.1)
SODIUM: 141 meq/L (ref 136–145)
TOTAL PROTEIN: 6.6 g/dL (ref 6.4–8.3)

## 2016-12-21 LAB — CBC WITH DIFFERENTIAL/PLATELET
BASO%: 0.3 % (ref 0.0–2.0)
BASOS ABS: 0 10*3/uL (ref 0.0–0.1)
EOS%: 1.6 % (ref 0.0–7.0)
Eosinophils Absolute: 0.1 10*3/uL (ref 0.0–0.5)
HCT: 38.8 % (ref 34.8–46.6)
HGB: 12.6 g/dL (ref 11.6–15.9)
LYMPH%: 26.9 % (ref 14.0–49.7)
MCH: 29.3 pg (ref 25.1–34.0)
MCHC: 32.5 g/dL (ref 31.5–36.0)
MCV: 90.1 fL (ref 79.5–101.0)
MONO#: 0.5 10*3/uL (ref 0.1–0.9)
MONO%: 10.6 % (ref 0.0–14.0)
NEUT%: 60.6 % (ref 38.4–76.8)
NEUTROS ABS: 2.7 10*3/uL (ref 1.5–6.5)
Platelets: 178 10*3/uL (ref 145–400)
RBC: 4.31 10*6/uL (ref 3.70–5.45)
RDW: 15.6 % — AB (ref 11.2–14.5)
WBC: 4.4 10*3/uL (ref 3.9–10.3)
lymph#: 1.2 10*3/uL (ref 0.9–3.3)

## 2016-12-21 MED ORDER — SODIUM CHLORIDE 0.9% FLUSH
10.0000 mL | Freq: Once | INTRAVENOUS | Status: AC
  Administered 2016-12-21: 10 mL
  Filled 2016-12-21: qty 10

## 2016-12-21 MED ORDER — HEPARIN SOD (PORK) LOCK FLUSH 100 UNIT/ML IV SOLN
500.0000 [IU] | Freq: Once | INTRAVENOUS | Status: AC
  Administered 2016-12-21: 500 [IU]
  Filled 2016-12-21: qty 5

## 2016-12-21 NOTE — Telephone Encounter (Signed)
Called patient regarding current schedule

## 2016-12-24 ENCOUNTER — Ambulatory Visit
Admission: RE | Admit: 2016-12-24 | Discharge: 2016-12-24 | Disposition: A | Discharge status: Home / Self Care | Payer: 59 | Source: Ambulatory Visit | Attending: Radiation Oncology | Admitting: Radiation Oncology

## 2016-12-24 DIAGNOSIS — C50412 Malignant neoplasm of upper-outer quadrant of left female breast: Secondary | ICD-10-CM | POA: Diagnosis not present

## 2016-12-24 DIAGNOSIS — Z510 Encounter for antineoplastic radiation therapy: Secondary | ICD-10-CM | POA: Diagnosis not present

## 2016-12-25 ENCOUNTER — Ambulatory Visit
Admission: RE | Admit: 2016-12-25 | Discharge: 2016-12-25 | Disposition: A | Discharge status: Home / Self Care | Payer: 59 | Source: Ambulatory Visit | Attending: Radiation Oncology | Admitting: Radiation Oncology

## 2016-12-25 ENCOUNTER — Telehealth: Payer: Self-pay | Admitting: *Deleted

## 2016-12-25 DIAGNOSIS — Z5112 Encounter for antineoplastic immunotherapy: Secondary | ICD-10-CM | POA: Diagnosis not present

## 2016-12-25 DIAGNOSIS — C50412 Malignant neoplasm of upper-outer quadrant of left female breast: Secondary | ICD-10-CM

## 2016-12-25 DIAGNOSIS — Z510 Encounter for antineoplastic radiation therapy: Secondary | ICD-10-CM | POA: Diagnosis not present

## 2016-12-25 DIAGNOSIS — C773 Secondary and unspecified malignant neoplasm of axilla and upper limb lymph nodes: Secondary | ICD-10-CM | POA: Diagnosis not present

## 2016-12-25 DIAGNOSIS — Z171 Estrogen receptor negative status [ER-]: Secondary | ICD-10-CM | POA: Diagnosis not present

## 2016-12-25 DIAGNOSIS — E119 Type 2 diabetes mellitus without complications: Secondary | ICD-10-CM | POA: Diagnosis not present

## 2016-12-25 MED ORDER — SONAFINE EX EMUL
1.0000 "application " | Freq: Once | CUTANEOUS | Status: AC
  Administered 2016-12-25: 1 via TOPICAL

## 2016-12-25 MED ORDER — SILVER SULFADIAZINE 1 % EX CREA
TOPICAL_CREAM | Freq: Two times a day (BID) | CUTANEOUS | Status: DC
  Administered 2016-12-25: 09:00:00 via TOPICAL

## 2016-12-25 NOTE — Telephone Encounter (Signed)
-----   Message from Truitt Merle, MD sent at 12/23/2016  8:33 PM EST ----- Please let pt know the lab result, and increase KCL by 79meq daily, thanks  Truitt Merle  12/23/2016

## 2016-12-25 NOTE — Telephone Encounter (Signed)
Late Enry:  Called pt yesterday & left message to increase K+ by 20 meq daily.  She called back & stated she understood message & will increase K+.

## 2016-12-26 ENCOUNTER — Ambulatory Visit: Payer: 59

## 2016-12-26 ENCOUNTER — Other Ambulatory Visit: Payer: 59

## 2016-12-26 ENCOUNTER — Ambulatory Visit: Payer: 59 | Admitting: Hematology

## 2016-12-26 ENCOUNTER — Telehealth: Payer: Self-pay | Admitting: Hematology

## 2016-12-26 ENCOUNTER — Ambulatory Visit
Admission: RE | Admit: 2016-12-26 | Discharge: 2016-12-26 | Disposition: A | Discharge status: Home / Self Care | Payer: 59 | Source: Ambulatory Visit | Attending: Radiation Oncology | Admitting: Radiation Oncology

## 2016-12-26 ENCOUNTER — Ambulatory Visit (HOSPITAL_BASED_OUTPATIENT_CLINIC_OR_DEPARTMENT_OTHER): Payer: 59

## 2016-12-26 ENCOUNTER — Other Ambulatory Visit (HOSPITAL_BASED_OUTPATIENT_CLINIC_OR_DEPARTMENT_OTHER): Payer: 59

## 2016-12-26 ENCOUNTER — Ambulatory Visit (HOSPITAL_BASED_OUTPATIENT_CLINIC_OR_DEPARTMENT_OTHER): Payer: 59 | Admitting: Nurse Practitioner

## 2016-12-26 ENCOUNTER — Encounter: Payer: Self-pay | Admitting: Nurse Practitioner

## 2016-12-26 VITALS — BP 123/88 | HR 79 | Temp 98.2°F | Resp 18 | Ht 64.5 in | Wt 223.1 lb

## 2016-12-26 DIAGNOSIS — Z171 Estrogen receptor negative status [ER-]: Secondary | ICD-10-CM | POA: Diagnosis not present

## 2016-12-26 DIAGNOSIS — I10 Essential (primary) hypertension: Secondary | ICD-10-CM

## 2016-12-26 DIAGNOSIS — L538 Other specified erythematous conditions: Secondary | ICD-10-CM

## 2016-12-26 DIAGNOSIS — E876 Hypokalemia: Secondary | ICD-10-CM

## 2016-12-26 DIAGNOSIS — C50412 Malignant neoplasm of upper-outer quadrant of left female breast: Secondary | ICD-10-CM

## 2016-12-26 DIAGNOSIS — C773 Secondary and unspecified malignant neoplasm of axilla and upper limb lymph nodes: Secondary | ICD-10-CM

## 2016-12-26 DIAGNOSIS — E119 Type 2 diabetes mellitus without complications: Secondary | ICD-10-CM | POA: Diagnosis not present

## 2016-12-26 DIAGNOSIS — Z5112 Encounter for antineoplastic immunotherapy: Secondary | ICD-10-CM | POA: Diagnosis not present

## 2016-12-26 DIAGNOSIS — G620 Drug-induced polyneuropathy: Secondary | ICD-10-CM

## 2016-12-26 DIAGNOSIS — Z510 Encounter for antineoplastic radiation therapy: Secondary | ICD-10-CM | POA: Diagnosis not present

## 2016-12-26 DIAGNOSIS — R197 Diarrhea, unspecified: Secondary | ICD-10-CM

## 2016-12-26 LAB — COMPREHENSIVE METABOLIC PANEL
ALBUMIN: 3.5 g/dL (ref 3.5–5.0)
ALK PHOS: 87 U/L (ref 40–150)
ALT: 17 U/L (ref 0–55)
AST: 17 U/L (ref 5–34)
Anion Gap: 9 mEq/L (ref 3–11)
BILIRUBIN TOTAL: 0.48 mg/dL (ref 0.20–1.20)
BUN: 18.6 mg/dL (ref 7.0–26.0)
CO2: 27 mEq/L (ref 22–29)
Calcium: 9.4 mg/dL (ref 8.4–10.4)
Chloride: 105 mEq/L (ref 98–109)
Creatinine: 0.8 mg/dL (ref 0.6–1.1)
EGFR: >60 mL/min/{1.73_m2} (ref >60)
GLUCOSE: 100 mg/dL (ref 70–140)
Potassium: 3.5 mEq/L (ref 3.5–5.1)
Sodium: 140 mEq/L (ref 136–145)
TOTAL PROTEIN: 6.8 g/dL (ref 6.4–8.3)

## 2016-12-26 LAB — CBC WITH DIFFERENTIAL/PLATELET
BASO%: 0.4 % (ref 0.0–2.0)
Basophils Absolute: 0 10*3/uL (ref 0.0–0.1)
EOS%: 1.6 % (ref 0.0–7.0)
Eosinophils Absolute: 0.1 10*3/uL (ref 0.0–0.5)
HCT: 39.5 % (ref 34.8–46.6)
HEMOGLOBIN: 12.8 g/dL (ref 11.6–15.9)
LYMPH%: 21.3 % (ref 14.0–49.7)
MCH: 29.6 pg (ref 25.1–34.0)
MCHC: 32.3 g/dL (ref 31.5–36.0)
MCV: 91.5 fL (ref 79.5–101.0)
MONO#: 0.5 10*3/uL (ref 0.1–0.9)
MONO%: 9 % (ref 0.0–14.0)
NEUT%: 67.7 % (ref 38.4–76.8)
NEUTROS ABS: 3.5 10*3/uL (ref 1.5–6.5)
Platelets: 174 10*3/uL (ref 145–400)
RBC: 4.31 10*6/uL (ref 3.70–5.45)
RDW: 18 % — AB (ref 11.2–14.5)
WBC: 5.2 10*3/uL (ref 3.9–10.3)
lymph#: 1.1 10*3/uL (ref 0.9–3.3)

## 2016-12-26 MED ORDER — SODIUM CHLORIDE 0.9 % IV SOLN
Freq: Once | INTRAVENOUS | Status: AC
  Administered 2016-12-26: 13:00:00 via INTRAVENOUS

## 2016-12-26 MED ORDER — ACETAMINOPHEN 325 MG PO TABS
650.0000 mg | ORAL_TABLET | Freq: Once | ORAL | Status: AC
  Administered 2016-12-26: 650 mg via ORAL

## 2016-12-26 MED ORDER — ACETAMINOPHEN 325 MG PO TABS
ORAL_TABLET | ORAL | Status: AC
  Filled 2016-12-26: qty 2

## 2016-12-26 MED ORDER — SODIUM CHLORIDE 0.9 % IV SOLN
420.0000 mg | Freq: Once | INTRAVENOUS | Status: AC
  Administered 2016-12-26: 420 mg via INTRAVENOUS
  Filled 2016-12-26: qty 14

## 2016-12-26 MED ORDER — SODIUM CHLORIDE 0.9% FLUSH
10.0000 mL | INTRAVENOUS | Status: DC | PRN
  Administered 2016-12-26: 10 mL
  Filled 2016-12-26: qty 10

## 2016-12-26 MED ORDER — TRASTUZUMAB CHEMO 150 MG IV SOLR
600.0000 mg | Freq: Once | INTRAVENOUS | Status: AC
  Administered 2016-12-26: 600 mg via INTRAVENOUS
  Filled 2016-12-26: qty 28.57

## 2016-12-26 MED ORDER — HEPARIN SOD (PORK) LOCK FLUSH 100 UNIT/ML IV SOLN
500.0000 [IU] | Freq: Once | INTRAVENOUS | Status: AC | PRN
  Administered 2016-12-26: 500 [IU]
  Filled 2016-12-26: qty 5

## 2016-12-26 NOTE — Patient Instructions (Signed)
[facility] Discharge Instructions for Patients Receiving Chemotherapy  Today you received the following chemotherapy agents Herceptin and Perjeta.   To help prevent nausea and vomiting after your treatment, we encourage you to take your nausea medication as directed.   If you develop nausea and vomiting that is not controlled by your nausea medication, call the clinic.   BELOW ARE SYMPTOMS THAT SHOULD BE REPORTED IMMEDIATELY:  *FEVER GREATER THAN 100.5 F  *CHILLS WITH OR WITHOUT FEVER  NAUSEA AND VOMITING THAT IS NOT CONTROLLED WITH YOUR NAUSEA MEDICATION  *UNUSUAL SHORTNESS OF BREATH  *UNUSUAL BRUISING OR BLEEDING  TENDERNESS IN MOUTH AND THROAT WITH OR WITHOUT PRESENCE OF ULCERS  *URINARY PROBLEMS  *BOWEL PROBLEMS  UNUSUAL RASH Items with * indicate a potential emergency and should be followed up as soon as possible.  Feel free to call the clinic should you have any questions or concerns. The clinic phone number is [phone].  Please show the CHEMO ALERT CARD at check-in to the Emergency Department and triage nurse.   

## 2016-12-26 NOTE — Progress Notes (Signed)
[place]  [phone] [phone]  Clinic Follow up Note   Patient Care Team: Orpah Melter, MD as PCP - General (Family Medicine) [name], MD as Consulting Physician (General Surgery) Truitt Merle, MD as Consulting Physician (Hematology) Gery Pray, MD as Consulting Physician (Radiation Oncology) Ladene Artist, MD as Consulting Physician (Gastroenterology) 12/26/2016  CHIEF COMPLAINT: F/U left breast cancer  SUMMARY OF ONCOLOGIC HISTORY: Oncology History   Cancer Staging Breast cancer of upper-outer quadrant of left female breast [facility]) Staging form: Breast, AJCC 8th Edition - Clinical stage from 04/25/2016: Stage IIA (cT2, cN0, cM0, G3, ER: Negative, PR: Negative, HER2: Positive) - Signed by Truitt Merle, MD on 05/02/2016 - Pathologic stage from 10/09/2016: No Stage Recommended (ypT2, pN1a, cM0, G3, ER: Negative, PR: Negative, HER2: Positive) - Signed by Truitt Merle, MD on 10/26/2016       Breast cancer of upper-outer quadrant of left female breast ([place])   04/24/2016 Mammogram    Category B breasts with a new irregular mass in the UOQ left breast middle depth. Ultrasound revealed a 3.9 cm mass in the 1:00 position. The left axilla was negative.       04/25/2016 Initial Biopsy    Biopsy of the left breast showed grade 3 invasive ductal carcinoma, DCIS, and lymphovascular invasion was present.      04/25/2016 Receptors her2    ER 0% negative, PR 0% negative, HER2 positive, Ki67 30%      05/02/2016 Initial Diagnosis    Breast cancer of upper-outer quadrant of left female breast ([place])      05/07/2016 Imaging    MRI of the bilateral breast 05/07/16 IMPRESSION: Lobulated enhancing mass (4.0 x 2.7 x 3.1 cm) in the upper-outer quadrant of the left breast corresponding with the recently diagnosed invasive mammary carcinoma. Linear enhancement extends 2.6 cm posterior to the mass worrisome for ductal carcinoma in-situ.      05/09/2016  Echocardiogram    Echo 05/09/16 -LF EF: 55-60%      05/11/2016 - 08/31/2016 Neo-Adjuvant Chemotherapy    Cycle 1 Docetaxel, Carboplatin, Herceptin and pejeta (TCHP), cycle 2 Docetaxel changed to Taxol, carbo dose reduced to AUC 4.5 (from 5) due to severe diarrhea and cytopenia, Perjeta held after cycle 2.   Due to poor toleration, Brandi Dickson was held after cycle 4 and Taxol changed to weekly X4. Herceptin was continued during the above chemo.        05/13/2016 Imaging    CT Angio Chest PE IMPRESSION: No evidence of pulmonary emboli. Left breast mass consistent with the given clinical history. Stable left adrenal lesion likely representing a small adenoma.      05/18/2016 - 05/24/2016 Hospital Admission    Patient presented with nausea, vomiting, and diarrhea; admitted to hospital with Hyponatremia      06/07/2016 - 06/09/2016 Hospital Admission    Patient presents to hospital complaints of rectal bleeding and abdominal cramps when defacating      06/07/2016 Imaging    CT ABDOMEN PELVIS W CONTRAST  IMPRESSION: 1. Wall thickening of the descending and sigmoid colon consistent with an infectious or inflammatory colitis. 2. There is also a short segment of narrowing and possible wall thickening of the superior ascending colon which could reflect a constricting mass or, more likely, be an area of persistent colonic spasm. This could be further assessed with either colonoscopy or a barium enema after the current symptoms of colitis have resolved. 3. Small, subcentimeter, low-density liver lesions which may all be  benign. However, 3 these are not evident on prior CT. Liver metastatic disease possible. These could be further assessed with liver MRI with and without contrast. 4. Adrenal lesions which are stable, on the right and myelolipoma and on the left most likely an adenoma.      06/07/2016 Imaging    CT A/P IMPRESSION: 1. Wall thickening of the descending and sigmoid colon  consistent with an infectious or inflammatory colitis. 2. There is also a short segment of narrowing and possible wall thickening of the superior ascending colon which could reflect a constricting mass or, more likely, be an area of persistent colonic spasm. This could be further assessed with either colonoscopy or a barium enema after the current symptoms of colitis have resolved. 3. Small, subcentimeter, low-density liver lesions which may all be benign. However, 3 these are not evident on prior CT. Liver metastatic disease possible. These could be further assessed with liver MRI with and without contrast. 4. Adrenal lesions which are stable, on the right and myelolipoma and on the left most likely an adenoma.      06/07/2016 - 06/09/2016 Hospital Admission    Diarrhea and rectal Bleeding      07/19/2016 - 07/20/2016 Hospital Admission    Diarrhea and dehydration      08/09/2016 Mammogram    Mammogram 08/09/16 IMPRESSION:  The 3.1 cm x 2 cm x 1.5 cm irregular equal density mass in the left breast is consistent with the known carcinoma showing mammographic evidence of preoperative chemotherapy response.         08/09/2016 Imaging    US of left breast 08/09/16 IMPRESSION:  The 2.7 cm lobulated mass in the left breast is a known biopsy positive for malignancy. Surgical and oncology consult in progress.      08/16/2016 Imaging    MRI Abdomen W WO Contrast IMPRESSION: Tiny sub-cm hepatic cysts. No evidence of metastatic disease or other acute findings. Tiny benign left adrenal adenoma and right adrenal myelolipoma.      09/14/2016 -  Chemotherapy    Maintenance Herceptin every 3 weeks. Added Perjeta back on 10/26/16      09/17/2016 Imaging    MRI Breast Bilateral 09/17/16 IMPRESSION: Smaller left breast mass, now measuring 2.5 cm. No new or suspicious enhancement in either breast.  RECOMMENDATION: Treatment plan.      10/09/2016 Surgery    LEFT BREAST LUMPECTOMY WITH  RADIOACTIVE SEED AND L4EFT AXILLARY SENTINEL LYMPH NODE BIOPSY by Dr. Newman on 10/09/16      10/09/2016 Pathology Results    Diagnosis 10/09/16 1. Breast, lumpectomy, Left - INVASIVE DUCTAL CARCINOMA, GRADE 3, SPANNING 2.4 CM. - HIGH GRADE DUCTAL CARCINOMA IN SITU WITH NECROSIS. - RESECTION MARGINS ARE NEGATIVE FOR CARCINOMA. - BIOPSY SITE. - SEE ONCOLOGY TABLE. 2. Breast, excision, Left additional medial margin - FIBROCYSTIC CHANGE. - NO MALIGNANCY IDENTIFIED. 3. Lymph node, sentinel, biopsy, Left Axillary - METASTATIC CARCINOMA IN ONE OF ONE LYMPH NODES (1/1).        11/22/2016 -  Radiation Therapy    Radiation with [name], plan to complete on 01/08/17      11/22/2016 -  Chemotherapy    Xeloda With radiation she will be on low dose 4 tablets in the am and 3 tablets in the pm. Starting 11/22/16 and plan to complete on 01/08/17. Without radiation she will be on full dose Xeloda, 14 days on and 7 days off, 4 tablets BID.      CURRENT THERAPY:  1. Maintenance   Herceptin every 3 weeks, started on 06/01/16. Add Perjeta back starting 10/26/16. 2. Xeloda With radiation she will be on 4 tablets in the am and 3 tablets in the pm. Starting 11/22/16 and plan to complete on 01/08/17. Without radiation she will be on full dose Xeloda, 14 days on and 7 days off, 4 tablets BID.    INTERVAL HISTORY: Brandi Dickson returns for f/u as scheduled. She is currently undergoing chemoradiation with xeloda to left breast. She has moderate skin breakdown to left neck and breast with darkening. She applies silvadene after treatments. She continues to work and complete activities as normal, fatigue is more noticeable in the evenings. Eating well but "could drink more." carbonation gives her stomach ache. Has diarrhea daily; if she has 2 loose BM in the morning she takes 2 imodium which will control diarrhea for rest of the day; denies n/v/c or mouthsores. Neuropathy to hands and feet is stable, present prior to  chemotherapy, occasionally makes typing difficult but otherwise preserved function. Palms are slightly dark and fingertips are pink and sensitive; no blisters, pain, or peeling to hands or feet.   REVIEW OF SYSTEMS:   Constitutional: Denies fevers, chills or abnormal weight loss (+) good appetite (+) mild evening fatigue  Eyes: Denies blurriness of vision Ears, nose, mouth, throat, and face: Denies mucositis or sore throat Respiratory: Denies cough, dyspnea or wheezes Cardiovascular: Denies palpitation, chest discomfort or lower extremity swelling Gastrointestinal:  Denies nausea, vomiting, constipation heartburn or change in bowel habits (+) intermittent diarrhea controlled with 2 imodium in AM  Skin: Denies abnormal skin rashes (+) redness, peeling, darkening to left neck, breast, and axilla; (+) left incision well healed  (+) fingertips pink (+) palms are dark  Lymphatics: Denies new lymphadenopathy or easy bruising Neurological:Denies new weaknesses (+) numbness, tingling to hands and feet stable; on nuerontin  Behavioral/Psych: Mood is stable, no new changes  All other systems were reviewed with the patient and are negative.  MEDICAL HISTORY:  Past Medical History:  Diagnosis Date  . Arthritis   . Asthma    triggered with [employer] perfumes and cigarette smoke  . Cancer (HCC)   . Colitis   . Diabetes mellitus without complication (HCC)   . Hypertension   . Neuropathy     SURGICAL HISTORY: Past Surgical History:  Procedure Laterality Date  . ABDOMINAL HYSTERECTOMY    . BREAST LUMPECTOMY WITH RADIOACTIVE SEED AND SENTINEL LYMPH NODE BIOPSY Left 10/09/2016   Procedure: LEFT BREAST LUMPECTOMY WITH RADIOACTIVE SEED AND L4EFT AXILLARY SENTINEL LYMPH NODE BIOPSY;  Surgeon: Newman, David, MD;  Location: [facility];  Service: General;  Laterality: Left;  . DILATION AND CURETTAGE OF UTERUS    . KNEE ARTHROSCOPY Left   . PORTACATH PLACEMENT Right 05/08/2016   Procedure:  INSERTION PORT-A-CATH WITH US;  Surgeon: David Newman, MD;  Location: [facility];  Service: General;  Laterality: Right;  . TONSILLECTOMY    . TOTAL HIP ARTHROPLASTY Right   . TOTAL HIP ARTHROPLASTY Left 03/06/2016   Procedure: LEFT TOTAL HIP ARTHROPLASTY ANTERIOR APPROACH;  Surgeon: Matthew Olin, MD;  Location: [facility];  Service: Orthopedics;  Laterality: Left;    I have reviewed the social history and family history with the patient and they are unchanged from previous note.  ALLERGIES:  is allergic to lisinopril; augmentin [amoxicillin-pot clavulanate]; benadryl [diphenhydramine]; and losartan potassium.  MEDICATIONS:  Current Outpatient Medications  Medication Sig Dispense Refill  . albuterol (PROVENTIL HFA;VENTOLIN HFA) 108 (90   Base) MCG/ACT inhaler Inhale 1-2 puffs into the lungs every 6 (six) hours as needed for wheezing or shortness of breath.    . capecitabine (XELODA) 500 MG tablet Take 4 tabs (2066m) by mouth in AM & 3 tabs (15068m in PM, on days of radiation only (Mon-Fri). Take with water a after meal. 150 tablet 0  . diazepam (VALIUM) 5 MG tablet Take 1 tablet (5 mg total) by mouth as directed. Take 5 mg by mouth 30 minutes prior to MRI procedure.   May repeat x 1. DO NOT Drive after taking Valium as it may cause drowsiness. 2 tablet 0  . dicyclomine (BENTYL) 20 MG tablet Take 1 tablet (20 mg total) by mouth 4 (four) times daily -  before meals and at bedtime. 30 tablet 0  . diphenoxylate-atropine (LOMOTIL) 2.5-0.025 MG tablet Take one to two tablets every six hours as needed for diarrhea 120 tablet 1  . ferrous sulfate 325 (65 FE) MG tablet Take 325 mg by mouth 2 (two) times daily with a meal.    . gabapentin (NEURONTIN) 100 MG capsule Take 200 mg by mouth at bedtime.     . Homeopathic Products (ZICAM ALLERGY RELIEF NA) Take 1 tablet by mouth daily.    . [name] KitchenYDROcodone-acetaminophen (NORCO/VICODIN) 5-325 MG tablet Take 1-2 tablets by mouth every 6 (six) hours as  needed for moderate pain. 20 tablet 0  . hydrocortisone (ANUSOL-HC) 25 MG suppository Place 1 suppository (25 mg total) rectally 2 (two) times daily. 12 suppository 0  . lidocaine-prilocaine (EMLA) cream Apply 1 application to skin 1.5 to 2 hrs before use.  Cover to secure cream with plastic wrap. 30 g PRN  . loperamide (IMODIUM) 1 MG/5ML solution Take 3 mg by mouth as needed for diarrhea or loose stools.    . [name] KitchenORazepam (ATIVAN) 0.5 MG tablet Take 1 tablet (0.5 mg total) by mouth once as needed for anxiety. 2 tablet 0  . magnesium oxide (MAG-OX) 400 MG tablet Take 400 mg by mouth daily.     . Mesalamine 800 MG TBEC Take 1,600 mg by mouth 2 (two) times daily.    . metFORMIN (GLUCOPHAGE-XR) 500 MG 24 hr tablet Take 1,000 mg by mouth at bedtime.    . metoprolol (LOPRESSOR) 50 MG tablet Take 50 mg by mouth 2 (two) times daily. Takes 1 in the am and 1 at HS    . niacin 250 MG tablet Take 250 mg by mouth at bedtime.     . potassium chloride SA (K-DUR,KLOR-CON) 20 MEQ tablet Take 1 tablet (20 mEq total) by mouth 2 (two) times daily. 60 tablet 2  . triamterene-hydrochlorothiazide (MAXZIDE) 75-50 MG per tablet Take 0.5 tablets by mouth daily.     . ondansetron (ZOFRAN) 8 MG tablet Take 1 tablet (8 mg total) by mouth 2 (two) times daily as needed for refractory nausea / vomiting. Start on day 3 after chemo. (Patient not taking: Reported on 12/26/2016) 30 tablet 1  . prochlorperazine (COMPAZINE) 10 MG tablet Take 1 tablet (10 mg total) by mouth every 6 (six) hours as needed (Nausea or vomiting). (Patient not taking: Reported on 12/26/2016) 30 tablet 1  . traMADol (ULTRAM) 50 MG tablet Take 1-2 tablets (50-100 mg total) by mouth every 6 (six) hours as needed. (Patient not taking: Reported on 12/26/2016) 40 tablet 0   No current facility-administered medications for this visit.    Facility-Administered Medications Ordered in Other Visits  Medication Dose Route Frequency Provider Last Rate Last Dose  .  heparin  lock flush 100 unit/mL  500 Units Intracatheter Once PRN Truitt Merle, MD      . sodium chloride flush (NS) 0.9 % injection 10 mL  10 mL Intracatheter PRN Truitt Merle, MD      . sodium chloride flush (NS) 0.9 % injection 10 mL  10 mL Intracatheter PRN Truitt Merle, MD   10 mL at 08/24/16 1725  . sodium chloride flush (NS) 0.9 % injection 10 mL  10 mL Intracatheter PRN Truitt Merle, MD   10 mL at 11/16/16 1628    PHYSICAL EXAMINATION: ECOG PERFORMANCE STATUS: 1 - Symptomatic but completely ambulatory  Vitals:   12/26/16 1009  BP: 123/88  Pulse: 79  Resp: 18  Temp: 98.2 F (36.8 C)  SpO2: 100%   Filed Weights   12/26/16 1009  Weight: 223 lb 1.6 oz (101.2 kg)    GENERAL:alert, no distress and comfortable SKIN/BREAST: skin  texture, turgor are normal, no rashes (+) mild palmar hyperpigmentation and erythema to fingertips, no peeling or blisters (+) moderate erythema with peeling with small area of moist desquamation to left neck and left inframammary fold secondary to radiation therapy (+) hyperpigmentation to left anterior chest wall (+) mild erythema to left upper back  EYES: normal, Conjunctiva are pink and non-injected, sclera clear OROPHARYNX:no exudate, no erythema and lips, buccal mucosa, and tongue normal  NECK: supple, thyroid normal size, non-tender, without nodularity LYMPH:  no palpable cervical, supraclavicular, or axillary lymphadenopathy  LUNGS: clear to auscultation bilaterally with normal breathing effort HEART: regular rate & rhythm and no murmurs and no lower extremity edema ABDOMEN:abdomen soft, non-tender and normal bowel sounds Musculoskeletal:no cyanosis of digits and no clubbing  NEURO: alert & oriented x 3 with fluent speech, no focal motor/sensory deficits PAC without erythema  LABORATORY DATA:  I have reviewed the data as listed CBC Latest Ref Rng & Units 12/26/2016 12/21/2016 12/14/2016  WBC 3.9 - 10.3 10e3/uL 5.2 4.4 5.0  Hemoglobin 11.6 - 15.9 g/dL 12.8 12.6  13.3  Hematocrit 34.8 - 46.6 % 39.5 38.8 41.7  Platelets 145 - 400 10e3/uL 174 178 192     CMP Latest Ref Rng & Units 12/26/2016 12/21/2016 12/14/2016  Glucose 70 - 140 mg/dl 100 101 112  BUN 7.0 - 26.0 mg/dL 18.6 11.8 12.6  Creatinine 0.6 - 1.1 mg/dL 0.8 0.8 0.9  Sodium 136 - 145 mEq/L 140 141 140  Potassium 3.5 - 5.1 mEq/L 3.5 3.1(L) 3.2(L)  Chloride 101 - 111 mmol/L - - -  CO2 22 - 29 mEq/L _0 Calcium 8.4 - 10.4 mg/dL 9.4 9.2 9.5  Total Protein 6.4 - 8.3 g/dL 6.8 6.6 7.1  Total Bilirubin 0.20 - 1.20 mg/dL 0.48 0.54 0.68  Alkaline Phos 40 - 150 U/L 87 84 93  AST 5 - 34 U/L _1 ALT 0 - 55 U/L _2 RADIOGRAPHIC STUDIES: I have personally reviewed the radiological images as listed and agreed with the findings in the report. No results found.   ASSESSMENT & PLAN: 56 y.o. post-menopausal Caucasian female with a self palpated left breast mass.  1. Breast cancer of upper-outer quadrant of left breast, invasive ductal carcinoma,  stage IIA (cT2N0M0) grade 3, ER-, PR-, HER2 amplified 2. Genetics 3. Type 2 DM, HTN 4. Ulcerative colitis 5. Arthritis 6. Peripheral neuropathy, secondary to chemotherapy and DM 7. Hypokalemia  Brandi Dickson appears stable today. Tolerating chemoradiation with Xeloda well overall; taking 4 tabs  AM and 3 tabs PM Monday - Friday with radiation. She has mild-moderate skin toxicity secondary to radiation, palmar erythema and hyperpigmentation secondary to xeloda is very mild. She will apply silvadene to breast and neck areas per rad onc instruction; I recommend OTC hydrocortisone topically to hands PRN to reduce inflammation. VS and weight stable, labs unremarkable. She will continue chemoradiation, Xeloda 2000 mg am and 1500 mg PM Monday - Friday with radiation. She continues q3week maintenance herceptin and perjeta, tolerating well. Diarrhea well managed with 2 imodium in the morning. Neuropathy secondary to previous chemo and longstanding  DM is stable overall, continues neurontin. Last echo 11/21/16 with preserved EF 55-60%, normal LV size and systolic function, normal diastolic, normal RV size and systolic function. She will proceed with herceptin/perjeta today. Return in 3 weeks for next cycle.    PLAN: OTC hydrocortisone to hands for mild palmar erythema and hyperpigmentation 2/2 to xeloda Continue chemoradiation with Xeloda, 2000 mg AM 1500 mg PM Monday - Friday with radiation Continue q3 week herceptin/perjeta, proceed with next cycle today   All questions were answered. The patient knows to call the clinic with any problems, questions or concerns. No barriers to learning was detected.      K , NP 12/26/16    

## 2016-12-26 NOTE — Telephone Encounter (Signed)
Gave avs and calendar for December

## 2016-12-27 ENCOUNTER — Ambulatory Visit
Admission: RE | Admit: 2016-12-27 | Discharge: 2016-12-27 | Disposition: A | Discharge status: Home / Self Care | Payer: 59 | Source: Ambulatory Visit | Attending: Radiation Oncology | Admitting: Radiation Oncology

## 2016-12-27 DIAGNOSIS — Z510 Encounter for antineoplastic radiation therapy: Secondary | ICD-10-CM | POA: Diagnosis not present

## 2016-12-27 DIAGNOSIS — C50412 Malignant neoplasm of upper-outer quadrant of left female breast: Secondary | ICD-10-CM | POA: Diagnosis not present

## 2016-12-28 ENCOUNTER — Ambulatory Visit: Payer: 59

## 2016-12-30 ENCOUNTER — Other Ambulatory Visit: Payer: Self-pay | Admitting: Hematology

## 2016-12-30 DIAGNOSIS — C50412 Malignant neoplasm of upper-outer quadrant of left female breast: Secondary | ICD-10-CM

## 2016-12-31 ENCOUNTER — Ambulatory Visit
Admission: RE | Admit: 2016-12-31 | Discharge: 2016-12-31 | Disposition: A | Discharge status: Home / Self Care | Payer: 59 | Source: Ambulatory Visit | Attending: Radiation Oncology | Admitting: Radiation Oncology

## 2016-12-31 DIAGNOSIS — C50412 Malignant neoplasm of upper-outer quadrant of left female breast: Secondary | ICD-10-CM | POA: Diagnosis not present

## 2016-12-31 DIAGNOSIS — Z510 Encounter for antineoplastic radiation therapy: Secondary | ICD-10-CM | POA: Diagnosis not present

## 2017-01-01 ENCOUNTER — Ambulatory Visit
Admission: RE | Admit: 2017-01-01 | Discharge: 2017-01-01 | Disposition: A | Discharge status: Home / Self Care | Payer: 59 | Source: Ambulatory Visit | Attending: Radiation Oncology | Admitting: Radiation Oncology

## 2017-01-01 ENCOUNTER — Ambulatory Visit: Admission: RE | Admit: 2017-01-01 | Payer: 59 | Source: Ambulatory Visit | Admitting: Radiation Oncology

## 2017-01-01 DIAGNOSIS — C50412 Malignant neoplasm of upper-outer quadrant of left female breast: Secondary | ICD-10-CM | POA: Diagnosis not present

## 2017-01-01 DIAGNOSIS — Z510 Encounter for antineoplastic radiation therapy: Secondary | ICD-10-CM | POA: Diagnosis not present

## 2017-01-02 ENCOUNTER — Ambulatory Visit: Payer: 59

## 2017-01-02 ENCOUNTER — Ambulatory Visit
Admission: RE | Admit: 2017-01-02 | Discharge: 2017-01-02 | Disposition: A | Discharge status: Home / Self Care | Payer: 59 | Source: Ambulatory Visit | Attending: Radiation Oncology | Admitting: Radiation Oncology

## 2017-01-02 DIAGNOSIS — C50412 Malignant neoplasm of upper-outer quadrant of left female breast: Secondary | ICD-10-CM

## 2017-01-02 DIAGNOSIS — Z510 Encounter for antineoplastic radiation therapy: Secondary | ICD-10-CM | POA: Diagnosis not present

## 2017-01-03 ENCOUNTER — Ambulatory Visit: Payer: 59

## 2017-01-03 ENCOUNTER — Ambulatory Visit
Admission: RE | Admit: 2017-01-03 | Discharge: 2017-01-03 | Disposition: A | Discharge status: Home / Self Care | Payer: 59 | Source: Ambulatory Visit | Attending: Radiation Oncology | Admitting: Radiation Oncology

## 2017-01-03 ENCOUNTER — Other Ambulatory Visit: Payer: Self-pay | Admitting: *Deleted

## 2017-01-03 DIAGNOSIS — Z510 Encounter for antineoplastic radiation therapy: Secondary | ICD-10-CM | POA: Diagnosis not present

## 2017-01-03 DIAGNOSIS — C50412 Malignant neoplasm of upper-outer quadrant of left female breast: Secondary | ICD-10-CM

## 2017-01-03 MED ORDER — CAPECITABINE 500 MG PO TABS: ORAL_TABLET | ORAL | 0 refills | Status: DC

## 2017-01-04 ENCOUNTER — Ambulatory Visit (HOSPITAL_BASED_OUTPATIENT_CLINIC_OR_DEPARTMENT_OTHER): Payer: 59

## 2017-01-04 ENCOUNTER — Other Ambulatory Visit (HOSPITAL_BASED_OUTPATIENT_CLINIC_OR_DEPARTMENT_OTHER): Payer: 59

## 2017-01-04 ENCOUNTER — Ambulatory Visit
Admission: RE | Admit: 2017-01-04 | Discharge: 2017-01-04 | Disposition: A | Discharge status: Home / Self Care | Payer: 59 | Source: Ambulatory Visit | Attending: Radiation Oncology | Admitting: Radiation Oncology

## 2017-01-04 ENCOUNTER — Ambulatory Visit: Payer: 59

## 2017-01-04 DIAGNOSIS — Z171 Estrogen receptor negative status [ER-]: Principal | ICD-10-CM

## 2017-01-04 DIAGNOSIS — Z510 Encounter for antineoplastic radiation therapy: Secondary | ICD-10-CM | POA: Diagnosis not present

## 2017-01-04 DIAGNOSIS — Z452 Encounter for adjustment and management of vascular access device: Secondary | ICD-10-CM | POA: Diagnosis not present

## 2017-01-04 DIAGNOSIS — C50412 Malignant neoplasm of upper-outer quadrant of left female breast: Secondary | ICD-10-CM

## 2017-01-04 DIAGNOSIS — Z95828 Presence of other vascular implants and grafts: Secondary | ICD-10-CM

## 2017-01-04 LAB — CBC WITH DIFFERENTIAL/PLATELET
BASO%: 0.4 % (ref 0.0–2.0)
Basophils Absolute: 0 10*3/uL (ref 0.0–0.1)
EOS%: 3.6 % (ref 0.0–7.0)
Eosinophils Absolute: 0.2 10*3/uL (ref 0.0–0.5)
HCT: 37.5 % (ref 34.8–46.6)
HEMOGLOBIN: 12.3 g/dL (ref 11.6–15.9)
LYMPH%: 20.6 % (ref 14.0–49.7)
MCH: 29.9 pg (ref 25.1–34.0)
MCHC: 32.8 g/dL (ref 31.5–36.0)
MCV: 91.1 fL (ref 79.5–101.0)
MONO#: 0.5 10*3/uL (ref 0.1–0.9)
MONO%: 10.2 % (ref 0.0–14.0)
NEUT%: 65.2 % (ref 38.4–76.8)
NEUTROS ABS: 2.9 10*3/uL (ref 1.5–6.5)
Platelets: 190 10*3/uL (ref 145–400)
RBC: 4.11 10*6/uL (ref 3.70–5.45)
RDW: 18.2 % — AB (ref 11.2–14.5)
WBC: 4.4 10*3/uL (ref 3.9–10.3)
lymph#: 0.9 10*3/uL (ref 0.9–3.3)

## 2017-01-04 LAB — COMPREHENSIVE METABOLIC PANEL
ALBUMIN: 3.5 g/dL (ref 3.5–5.0)
ALK PHOS: 86 U/L (ref 40–150)
ALT: 11 U/L (ref 0–55)
ANION GAP: 11 meq/L (ref 3–11)
AST: 14 U/L (ref 5–34)
BILIRUBIN TOTAL: 0.65 mg/dL (ref 0.20–1.20)
BUN: 14.1 mg/dL (ref 7.0–26.0)
CALCIUM: 9.4 mg/dL (ref 8.4–10.4)
CO2: 27 mEq/L (ref 22–29)
CREATININE: 0.8 mg/dL (ref 0.6–1.1)
Chloride: 102 mEq/L (ref 98–109)
EGFR: >60 mL/min/{1.73_m2} (ref >60)
Glucose: 103 mg/dl (ref 70–140)
Potassium: 3.3 mEq/L — ABNORMAL LOW (ref 3.5–5.1)
Sodium: 139 mEq/L (ref 136–145)
TOTAL PROTEIN: 6.7 g/dL (ref 6.4–8.3)

## 2017-01-04 MED ORDER — SODIUM CHLORIDE 0.9% FLUSH
10.0000 mL | Freq: Once | INTRAVENOUS | Status: AC
  Administered 2017-01-04: 10 mL
  Filled 2017-01-04: qty 10

## 2017-01-04 MED ORDER — HEPARIN SOD (PORK) LOCK FLUSH 100 UNIT/ML IV SOLN
250.0000 [IU] | Freq: Once | INTRAVENOUS | Status: DC
Start: 2017-01-04 — End: 2017-01-04
  Filled 2017-01-04: qty 5

## 2017-01-04 MED ORDER — HEPARIN SOD (PORK) LOCK FLUSH 100 UNIT/ML IV SOLN
500.0000 [IU] | Freq: Once | INTRAVENOUS | Status: AC
  Administered 2017-01-04: 500 [IU]
  Filled 2017-01-04: qty 5

## 2017-01-07 ENCOUNTER — Ambulatory Visit: Payer: 59

## 2017-01-08 ENCOUNTER — Ambulatory Visit: Payer: 59

## 2017-01-08 ENCOUNTER — Ambulatory Visit
Admission: RE | Admit: 2017-01-08 | Discharge: 2017-01-08 | Disposition: A | Discharge status: Home / Self Care | Payer: 59 | Source: Ambulatory Visit | Attending: Radiation Oncology | Admitting: Radiation Oncology

## 2017-01-08 DIAGNOSIS — Z510 Encounter for antineoplastic radiation therapy: Secondary | ICD-10-CM | POA: Diagnosis not present

## 2017-01-09 ENCOUNTER — Ambulatory Visit: Payer: 59

## 2017-01-09 ENCOUNTER — Ambulatory Visit
Admission: RE | Admit: 2017-01-09 | Discharge: 2017-01-09 | Disposition: A | Discharge status: Home / Self Care | Payer: 59 | Source: Ambulatory Visit | Attending: Radiation Oncology | Admitting: Radiation Oncology

## 2017-01-09 DIAGNOSIS — Z510 Encounter for antineoplastic radiation therapy: Secondary | ICD-10-CM | POA: Diagnosis not present

## 2017-01-10 ENCOUNTER — Encounter: Payer: Self-pay | Admitting: Radiation Oncology

## 2017-01-10 ENCOUNTER — Ambulatory Visit
Admission: RE | Admit: 2017-01-10 | Discharge: 2017-01-10 | Disposition: A | Discharge status: Home / Self Care | Payer: 59 | Source: Ambulatory Visit | Attending: Radiation Oncology | Admitting: Radiation Oncology

## 2017-01-10 ENCOUNTER — Other Ambulatory Visit: Payer: 59

## 2017-01-10 ENCOUNTER — Ambulatory Visit: Payer: 59 | Admitting: Hematology

## 2017-01-10 DIAGNOSIS — Z510 Encounter for antineoplastic radiation therapy: Secondary | ICD-10-CM | POA: Diagnosis not present

## 2017-01-10 DIAGNOSIS — C50412 Malignant neoplasm of upper-outer quadrant of left female breast: Secondary | ICD-10-CM | POA: Diagnosis not present

## 2017-01-10 MED ORDER — SILVER SULFADIAZINE 1 % EX CREA
TOPICAL_CREAM | Freq: Once | CUTANEOUS | Status: AC
  Administered 2017-01-10: 10:00:00 via TOPICAL

## 2017-01-10 NOTE — Progress Notes (Signed)
[place]  [phone] [phone]  Clinic Follow Up Note   Patient Care Team: Orpah Melter, MD as PCP - General (Family Medicine) [name], MD as Consulting Physician (General Surgery) Truitt Merle, MD as Consulting Physician (Hematology) Gery Pray, MD as Consulting Physician (Radiation Oncology) Ladene Artist, MD as Consulting Physician (Gastroenterology)   Date of Service:  01/11/2017   CHIEF COMPLAINTS:  Follow up left breast cancer  Oncology History   Cancer Staging Breast cancer of upper-outer quadrant of left female breast [facility]) Staging form: Breast, AJCC 8th Edition - Clinical stage from 04/25/2016: Stage IIA (cT2, cN0, cM0, G3, ER: Negative, PR: Negative, HER2: Positive) - Signed by Truitt Merle, MD on 05/02/2016 - Pathologic stage from 10/09/2016: No Stage Recommended (ypT2, pN1a, cM0, G3, ER: Negative, PR: Negative, HER2: Positive) - Signed by Truitt Merle, MD on 10/26/2016       Breast cancer of upper-outer quadrant of left female breast ([place])   04/24/2016 Mammogram    Category B breasts with a new irregular mass in the UOQ left breast middle depth. Ultrasound revealed a 3.9 cm mass in the 1:00 position. The left axilla was negative.       04/25/2016 Initial Biopsy    Biopsy of the left breast showed grade 3 invasive ductal carcinoma, DCIS, and lymphovascular invasion was present.      04/25/2016 Receptors her2    ER 0% negative, PR 0% negative, HER2 positive, Ki67 30%      05/02/2016 Initial Diagnosis    Breast cancer of upper-outer quadrant of left female breast ([place])      05/07/2016 Imaging    MRI of the bilateral breast 05/07/16 IMPRESSION: Lobulated enhancing mass (4.0 x 2.7 x 3.1 cm) in the upper-outer quadrant of the left breast corresponding with the recently diagnosed invasive mammary carcinoma. Linear enhancement extends 2.6 cm posterior to the mass worrisome for ductal carcinoma in-situ.      05/09/2016  Echocardiogram    Echo 05/09/16 -LF EF: 55-60%      05/11/2016 - 08/31/2016 Neo-Adjuvant Chemotherapy    Cycle 1 Docetaxel, Carboplatin, Herceptin and pejeta (TCHP), cycle 2 Docetaxel changed to Taxol, carbo dose reduced to AUC 4.5 (from 5) due to severe diarrhea and cytopenia, Perjeta held after cycle 2.   Due to poor toleration, [name] was held after cycle 4 and Taxol changed to weekly X4. Herceptin was continued during the above chemo.        05/13/2016 Imaging    CT Angio Chest PE IMPRESSION: No evidence of pulmonary emboli. Left breast mass consistent with the given clinical history. Stable left adrenal lesion likely representing a small adenoma.      05/18/2016 - 05/24/2016 Hospital Admission    Patient presented with nausea, vomiting, and diarrhea; admitted to hospital with Hyponatremia      06/07/2016 - 06/09/2016 Hospital Admission    Patient presents to hospital complaints of rectal bleeding and abdominal cramps when defacating      06/07/2016 Imaging    CT ABDOMEN PELVIS W CONTRAST  IMPRESSION: 1. Wall thickening of the descending and sigmoid colon consistent with an infectious or inflammatory colitis. 2. There is also a short segment of narrowing and possible wall thickening of the superior ascending colon which could reflect a constricting mass or, more likely, be an area of persistent colonic spasm. This could be further assessed with either colonoscopy or a barium enema after the current symptoms of colitis have resolved. 3. Small, subcentimeter, low-density liver  lesions which may all be benign. However, 3 these are not evident on prior CT. Liver metastatic disease possible. These could be further assessed with liver MRI with and without contrast. 4. Adrenal lesions which are stable, on the right and myelolipoma and on the left most likely an adenoma.      06/07/2016 Imaging    CT A/P IMPRESSION: 1. Wall thickening of the descending and sigmoid colon  consistent with an infectious or inflammatory colitis. 2. There is also a short segment of narrowing and possible wall thickening of the superior ascending colon which could reflect a constricting mass or, more likely, be an area of persistent colonic spasm. This could be further assessed with either colonoscopy or a barium enema after the current symptoms of colitis have resolved. 3. Small, subcentimeter, low-density liver lesions which may all be benign. However, 3 these are not evident on prior CT. Liver metastatic disease possible. These could be further assessed with liver MRI with and without contrast. 4. Adrenal lesions which are stable, on the right and myelolipoma and on the left most likely an adenoma.      06/07/2016 - 06/09/2016 Hospital Admission    Diarrhea and rectal Bleeding      07/19/2016 - 07/20/2016 Hospital Admission    Diarrhea and dehydration      08/09/2016 Mammogram    Mammogram 08/09/16 IMPRESSION:  The 3.1 cm x 2 cm x 1.5 cm irregular equal density mass in the left breast is consistent with the known carcinoma showing mammographic evidence of preoperative chemotherapy response.         08/09/2016 Imaging    [place] of left breast 08/09/16 IMPRESSION:  The 2.7 cm lobulated mass in the left breast is a known biopsy positive for malignancy. Surgical and oncology consult in progress.      08/16/2016 Imaging    MRI Abdomen W WO Contrast IMPRESSION: Tiny sub-cm hepatic cysts. No evidence of metastatic disease or other acute findings. Tiny benign left adrenal adenoma and right adrenal myelolipoma.      09/14/2016 -  Chemotherapy    Maintenance Herceptin every 3 weeks. Added Perjeta back on 10/26/16      09/17/2016 Imaging    MRI Breast Bilateral 09/17/16 IMPRESSION: Smaller left breast mass, now measuring 2.5 cm. No new or suspicious enhancement in either breast.  RECOMMENDATION: Treatment plan.      10/09/2016 Surgery    LEFT BREAST LUMPECTOMY WITH  RADIOACTIVE SEED AND L4EFT AXILLARY SENTINEL LYMPH NODE BIOPSY by Dr. Lucia Gaskins on 10/09/16      10/09/2016 Pathology Results    Diagnosis 10/09/16 1. Breast, lumpectomy, Left - INVASIVE DUCTAL CARCINOMA, GRADE 3, SPANNING 2.4 CM. - HIGH GRADE DUCTAL CARCINOMA IN SITU WITH NECROSIS. - RESECTION MARGINS ARE NEGATIVE FOR CARCINOMA. - BIOPSY SITE. - SEE ONCOLOGY TABLE. 2. Breast, excision, Left additional medial margin - FIBROCYSTIC CHANGE. - NO MALIGNANCY IDENTIFIED. 3. Lymph node, sentinel, biopsy, Left Axillary - METASTATIC CARCINOMA IN ONE OF ONE LYMPH NODES (1/1).        11/22/2016 - 01/10/2017 Radiation Therapy    Radiation with Dr. Sondra Come and complete on 01/10/17      11/22/2016 -  Chemotherapy    Xeloda With radiation she will be on low dose 4 tablets in the am and 3 tablets in the pm. Starting 11/22/16 and complete on 01/10/17.   Will continue Xeloda, 14 days on and 7 days off, 4 tablets BID starting 01/18/17        HISTORY OF PRESENTING  ILLNESS (05/02/16):  [name] 56 y.o. female is here because of a new diagnosis of left breast cancer. She is accompanied by her husband to our multidisciplinary breast clinic today.  The patient presented with a palpable left breast lump approximately 2 weeks ago. Bilateral diagnostic mammogram on 04/24/16 showed Category B breasts with a new irregular mass in the UOQ left breast middle depth. Ultrasound performed on 04/24/16 revealed a 3.9 cm mass in the 1:00 position. The left axilla was negative.  Biopsy of the left breast on 04/25/16 showed grade 3 invasive ductal carcinoma, DCIS, and lymphovascular invasion was present (ER 0% negative, PR 0% negative, HER2 positive, Ki67 30%).  She denies tenderness of the biopsied area. Reports minor dimpling. The patient is 8 weeks out from a left hip replacement. She had a right hip replacement 2010 years ago.  The patient and her husband present today in multidisciplinary breast clinic to  discuss treatment options for the management of her disease.  The patient is taking Neurontin for neuropathy in her feet from diabetes. She has been diagnosed for diabetes for the past 4 years. She states she only has neuropathy at night and take 2 Neurontin at night and then has no symptoms.  GYN HISTORY  Menarchal: 11 LMP: Complete hysterectomy for uterine fibroids ~ 2009. She was bleeding too much with her last menstrual cycle lasting 6 weeks. No issues with menopause. Contraceptive: no  HRT: No GP: G1P0  CURRENT THERAPY:  1. Maintenance Herceptin every 3 weeks, started on 06/01/16. Add Perjeta back starting 10/26/16. 2. Xeloda With concurrent with 2059m in am and 15055mpm, started 11/22/16 and completed on 01/10/17.  3. Continue Xeloda 200032m12h, 14 days on and 7 days off,  starting 01/18/17 for 4 months    INTERVAL HISTORY:  RebSANJUANITA CONDREYturns for follow up. She presents to the clinic today noting she completed radiation yesterday. She has diffuse erythema that has improved. She previously had a open sore but now resolved with use of silvadene and neosporin. She has some skin rawness but is healing. She notes her nipple pealed and has some tenderness. Overall she is recovering well. She reported to having mild diarrhea from Xeloda, overall tolerated. She notes she will take lomotil about 4 tabs a day at the most. Her appetite and energy is adequate. She is on her off week of Xeloda.  She plans to go to the beach in 1/18 and return on 1/21.    MEDICAL HISTORY:  Past Medical History:  Diagnosis Date  . Arthritis   . Asthma    triggered with EstMindi Curlingrfumes and cigarette smoke  . [place] . Colitis   . Diabetes mellitus without complication ([place] . Hypertension   . Neuropathy     SURGICAL HISTORY: Past Surgical History:  Procedure Laterality Date  . ABDOMINAL HYSTERECTOMY    . BREAST LUMPECTOMY WITH RADIOACTIVE SEED AND SENTINEL LYMPH NODE BIOPSY Left  10/09/2016   Procedure: LEFT BREAST LUMPECTOMY WITH RADIOACTIVE SEED AND L4EFT AXILLARY SENTINEL LYMPH NODE BIOPSY;  Surgeon: NewAlphonsa OverallD;  [facility];  Laterality: Left;  . DILATION AND CURETTAGE OF UTERUS    . KNEE ARTHROSCOPY Left   . PORTACATH PLACEMENT Right 05/08/2016   Procedure: INSERTION PORT-A-CATH WITH US;[place]oreaSurgeon: DavAlphonsa OverallD;  [facility];  Laterality: Right;  . TONSILLECTOMY    . TOTAL HIP ARTHROPLASTY  Right   . TOTAL HIP ARTHROPLASTY Left 03/06/2016   Procedure: LEFT TOTAL HIP ARTHROPLASTY ANTERIOR APPROACH;  Surgeon: [name], MD;  Location: [facility];  Service: Orthopedics;  Laterality: Left;    SOCIAL HISTORY: Social History   Socioeconomic History  . Marital status: Married    Spouse name: Not on file  . Number of children: 0  . Years of education: Not on file  . Highest education level: Not on file  Social Needs  . Financial resource strain: Not on file  . Food insecurity - worry: Not on file  . Food insecurity - inability: Not on file  . Transportation needs - medical: Not on file  . Transportation needs - non-medical: Not on file  Occupational History  . Not on file  Tobacco Use  . Smoking status: Former Smoker    Packs/day: 1.00    Years: 29.00    Pack years: 29.00    Types: Cigarettes    Last attempt to quit: 10/27/2002    Years since quitting: 14.2  . Smokeless tobacco: Never Used  Substance and Sexual Activity  . Alcohol use: No    Alcohol/week: 0.0 oz  . Drug use: No  . Sexual activity: Yes    Birth control/protection: Surgical  Other Topics Concern  . Not on file  Social History Narrative  . Not on file    FAMILY HISTORY: Family History  Problem Relation Age of Onset  . Colon cancer Father   . Stomach cancer Paternal Uncle   . Stomach cancer Paternal Uncle   . Melanoma Brother   . Thyroid cancer Brother   . Breast cancer Maternal Aunt    . Breast cancer Maternal Aunt   . Breast cancer Cousin     ALLERGIES:  is allergic to lisinopril; augmentin [amoxicillin-pot clavulanate]; benadryl [diphenhydramine]; and losartan potassium.  MEDICATIONS:  Current Outpatient Medications  Medication Sig Dispense Refill  . albuterol (PROVENTIL HFA;VENTOLIN HFA) 108 (90 Base) MCG/ACT inhaler Inhale 1-2 puffs into the lungs every 6 (six) hours as needed for wheezing or shortness of breath.    . capecitabine (XELODA) 500 MG tablet Take 4 tabs (2048m) by mouth in AM & 3 tabs (15088m in PM, on days of radiation only (Mon-Fri). Take with water a after meal. 150 tablet 0  . diazepam (VALIUM) 5 MG tablet Take 1 tablet (5 mg total) by mouth as directed. Take 5 mg by mouth 30 minutes prior to MRI procedure.   May repeat x 1. DO NOT Drive after taking Valium as it may cause drowsiness. 2 tablet 0  . dicyclomine (BENTYL) 20 MG tablet Take 1 tablet (20 mg total) by mouth 4 (four) times daily -  before meals and at bedtime. 30 tablet 0  . diphenoxylate-atropine (LOMOTIL) 2.5-0.025 MG tablet Take one to two tablets every six hours as needed for diarrhea 120 tablet 1  . ferrous sulfate 325 (65 FE) MG tablet Take 325 mg by mouth 2 (two) times daily with a meal.    . gabapentin (NEURONTIN) 100 MG capsule Take 200 mg by mouth at bedtime.     . Homeopathic Products (ZICAM ALLERGY RELIEF NA) Take 1 tablet by mouth daily.    . hydrocortisone (ANUSOL-HC) 25 MG suppository Place 1 suppository (25 mg total) rectally 2 (two) times daily. (Patient taking differently: Place 25 mg rectally as needed. ) 12 suppository 0  . lidocaine-prilocaine (EMLA) cream Apply 1 application to skin 1.5 to 2 hrs before use.  Cover to  secure cream with plastic wrap. 30 g PRN  . loperamide (IMODIUM) 1 MG/5ML solution Take 3 mg by mouth as needed for diarrhea or loose stools.    . magnesium oxide (MAG-OX) 400 MG tablet Take 400 mg by mouth daily.     . Mesalamine 800 MG TBEC Take 1,600 mg  by mouth 2 (two) times daily.    . metFORMIN (GLUCOPHAGE-XR) 500 MG 24 hr tablet Take 1,000 mg by mouth at bedtime.    . metoprolol (LOPRESSOR) 50 MG tablet Take 50 mg by mouth 2 (two) times daily. Takes 1 in the am and 1 at HS    . niacin 250 MG tablet Take 250 mg by mouth at bedtime.     . potassium chloride SA (K-DUR,KLOR-CON) 20 MEQ tablet Take 1 tablet (20 mEq total) by mouth 2 (two) times daily. 60 tablet 2  . triamterene-hydrochlorothiazide (MAXZIDE) 75-50 MG per tablet Take 0.5 tablets by mouth daily.     [name] Kitchen HYDROcodone-acetaminophen (NORCO/VICODIN) 5-325 MG tablet Take 1-2 tablets by mouth every 6 (six) hours as needed for moderate pain. (Patient not taking: Reported on 01/11/2017) 20 tablet 0  . LORazepam (ATIVAN) 0.5 MG tablet Take 1 tablet (0.5 mg total) by mouth once as needed for anxiety. (Patient not taking: Reported on 01/11/2017) 2 tablet 0  . ondansetron (ZOFRAN) 8 MG tablet Take 1 tablet (8 mg total) by mouth 2 (two) times daily as needed for refractory nausea / vomiting. Start on day 3 after chemo. (Patient not taking: Reported on 12/26/2016) 30 tablet 1  . prochlorperazine (COMPAZINE) 10 MG tablet Take 1 tablet (10 mg total) by mouth every 6 (six) hours as needed (Nausea or vomiting). (Patient not taking: Reported on 12/26/2016) 30 tablet 1  . traMADol (ULTRAM) 50 MG tablet Take 1-2 tablets (50-100 mg total) by mouth every 6 (six) hours as needed. (Patient not taking: Reported on 12/26/2016) 40 tablet 0   No current facility-administered medications for this visit.    Facility-Administered Medications Ordered in Other Visits  Medication Dose Route Frequency Provider Last Rate Last Dose  . heparin lock flush 100 unit/mL  500 Units Intracatheter Once PRN Truitt Merle, MD      . sodium chloride flush (NS) 0.9 % injection 10 mL  10 mL Intracatheter PRN Truitt Merle, MD      . sodium chloride flush (NS) 0.9 % injection 10 mL  10 mL Intracatheter PRN Truitt Merle, MD   10 mL at 08/24/16 1725   . sodium chloride flush (NS) 0.9 % injection 10 mL  10 mL Intracatheter PRN Truitt Merle, MD   10 mL at 11/16/16 1628   REVIEW OF SYSTEMS:   Constitutional: Denies fevers, chills or abnormal night sweats  Eyes: Denies blurriness of vision, double vision or watery eyes Ears, nose, mouth, throat, and face: Denies mucositis or sore throat Respiratory: Denies cough, dyspnea or wheezes Cardiovascular: Denies palpitation.  Gastrointestinal:  Denies heartburn. Denies nausea, vomiting. (+) mild diarrhea, manageable  Skin: Denies abnormal skin rashes Breast: (+) diffuse erythema on left breast and axilla and skin peeling from radiation Lymphatics: Denies new lymphadenopathy or easy bruising Neurological: (+) neuropathy in hands/feet, unchanged Behavioral/Psych: Mood is stable, no new changes  All other systems were reviewed with the patient and are negative.   PHYSICAL EXAMINATION:  ECOG PERFORMANCE STATUS: 1 - Symptomatic but completely ambulatory  Vitals:   01/11/17 1046  BP: 114/74  Pulse: 75  Resp: 20  Temp: 97.8 F (36.6 C)  SpO2:  98%   Filed Weights   01/11/17 1046  Weight: 217 lb 4.8 oz (98.6 kg)     GENERAL:alert, no distress and comfortable SKIN: skin color, texture, turgor are normal, no rashes or significant lesions EYES: normal, conjunctiva are pink and non-injected, sclera clear OROPHARYNX:no exudate, no erythema and lips, buccal mucosa, and tongue normal  NECK: supple, thyroid normal size, non-tender, without nodularity LYMPH:  no palpable lymphadenopathy in the cervical, axillary or inguinal LUNGS: clear to auscultation and percussion with normal breathing effort HEART: regular rate & rhythm and no murmurs and no lower extremity edema ABDOMEN:abdomen soft, non-tender and normal bowel sounds Musculoskeletal:no cyanosis of digits and no clubbing  PSYCH: alert & oriented x 3 with fluent speech NEURO: no focal motor/sensory deficits BREAST: (+) s/p lumpectomy inc in UOQ  of left breast, healing well, no discharge, palpable mass or scar. Skin hyperpigmentation on left breast from radiation, no skin ulcers or skin breakdown.   LABORATORY DATA:  I have reviewed the data as listed CBC Latest Ref Rng & Units 01/11/2017 01/04/2017 12/26/2016  WBC 3.9 - 10.3 10e3/uL 4.7 4.4 5.2  Hemoglobin 11.6 - 15.9 g/dL 12.8 12.3 12.8  Hematocrit 34.8 - 46.6 % 39.9 37.5 39.5  Platelets 145 - 400 10e3/uL 182 190 174   CMP Latest Ref Rng & Units 01/11/2017 01/04/2017 12/26/2016  Glucose 70 - 140 mg/dl 114 103 100  BUN 7.0 - 26.0 mg/dL 14.7 14.1 18.6  Creatinine 0.6 - 1.1 mg/dL 0.9 0.8 0.8  Sodium 136 - 145 mEq/L 141 139 140  Potassium 3.5 - 5.1 mEq/L 3.3(L) 3.3(L) 3.5  Chloride 101 - 111 mmol/L - - -  CO2 22 - 29 mEq/L 25 27 27   Calcium 8.4 - 10.4 mg/dL 9.4 9.4 9.4  Total Protein 6.4 - 8.3 g/dL 6.7 6.7 6.8  Total Bilirubin 0.20 - 1.20 mg/dL 0.58 0.65 0.48  Alkaline Phos 40 - 150 U/L 99 86 87  AST 5 - 34 U/L 16 14 17   ALT 0 - 55 U/L 14 11 17    PATHOLOGY REPORT:    Diagnosis 10/09/16 1. Breast, lumpectomy, Left - INVASIVE DUCTAL CARCINOMA, GRADE 3, SPANNING 2.4 CM. - HIGH GRADE DUCTAL CARCINOMA IN SITU WITH NECROSIS. - RESECTION MARGINS ARE NEGATIVE FOR CARCINOMA. - BIOPSY SITE. - SEE ONCOLOGY TABLE. 2. Breast, excision, Left additional medial margin - FIBROCYSTIC CHANGE. - NO MALIGNANCY IDENTIFIED. 3. Lymph node, sentinel, biopsy, Left Axillary - METASTATIC CARCINOMA IN ONE OF ONE LYMPH NODES (1/1). Microscopic Comment 1. BREAST, STATUS POST NEOADJUVANT TREATMENT Procedure: Left lumpectomy with additional medial margin excision, left axillary sentinel lymph node biopsy. Laterality: Left. Tumor Size: 2.4 cm. Histologic Type: Invasive ductal carcinoma. Grade: 3 Tubular Differentiation: 3 Nuclear Pleomorphism: 3 Mitotic Count: 3 Ductal Carcinoma in Situ (DCIS): Present, high grade with necrosis. Regional Lymph Nodes: Number of Lymph Nodes Examined: 1 Number of  Sentinel Lymph Nodes Examined: 1 Lymph Nodes with Macrometastases: 1 Lymph Nodes with Micrometastases: 0 Lymph Nodes with Isolated Tumor Cells: 0 Margins: Invasive carcinoma, distance from closest margin: >0.5 cm all original margins. DCIS, distance from closest margin: >0.5 cm all original margins. Extent of Tumor: Confined to breast parenchyma. Breast Prognostic Profile (pre-neoadjuvant case #: [id number], see below). Estrogen Receptor: Negative. Progesterone Receptor: Negative. Her2: Positive(ratio 2.25). Ki-67: 30% 2 of 4 FINAL for Taniguchi, Carolin P ([id number]) Microscopic Comment(continued) Will be repeated on the current case (Block #: 1A) and the results reported separately. Residual Cancer Burden (RCB): Primary Tumor Bed: 24 mm x 20  mm Overall Cancer Cellularity: 75% Percentage of Cancer that is in Situ: 5% Number of Positive Lymph Nodes: 1 Diameter of Largest Lymph Node metastasis: 3 mm Residual Cancer Burden : 3.439 Residual Cancer Burden Class: RCB-III Pathologic Stage Classification (p TNM, AJCC 8th Edition): Primary Tumor (ypT): ypT2 Regional Lymph Nodes (ypN): ypN1a 1. PROGNOSTIC INDICATORS Results: IMMUNOHISTOCHEMICAL AND MORPHOMETRIC ANALYSIS PERFORMED MANUALLY Estrogen Receptor: 30%, POSITIVE, WEAK STAINING INTENSITY Progesterone Receptor: 0%, NEGATIVE COMMENT: The negative hormone receptor study(ies) in this case has an internal positive control. REFERENCE RANGE ESTROGEN RECEPTOR NEGATIVE 0% POSITIVE =>1% REFERENCE RANGE PROGESTERONE RECEPTOR NEGATIVE 0% POSITIVE =>1% All controls stained appropriately Enid Cutter MD Pathologist, Electronic Signature ( Signed 10/16/2016) 1. FLUORESCENCE IN-SITU HYBRIDIZATION Results: HER2 - **POSITIVE** RATIO OF HER2/CEP17 SIGNALS 2.67 AVERAGE HER2 COPY NUMBER PER CELL 4.00 Reference Range: NEGATIVE HER2/CEP17 Ratio <2.0 and average HER2 copy number <4.0 L DIAG NOSIS Diagnosis 04/25/16 Breast, left, needle core  biopsy - INVASIVE DUCTAL CARCINOMA, SEE COMMENT. - DUCTAL CARCINOMA IN SITU. - LYMPHOVASCULAR INVASION PRESENT. Microscopic Comment The carcinoma appears grade 3 with focal squamous differentiation. Prognostic markers will be ordered. Dr. Lyndon Code has reviewed the case. The case was called to Dr. Isaiah Blakes on 04/26/2016. Vicente Males MD Pathologist, Electronic Signature (Case signed 04/26/2016) ADDITIONAL INFORMATION: 04/25/16 PROGNOSTIC INDICATORS Results: IMMUNOHISTOCHEMICAL AND MORPHOMETRIC ANALYSIS PERFORMED MANUALLY Estrogen Receptor: 0%, NEGATIVE Progesterone Receptor: 0%, NEGATIVE Proliferation Marker Ki67: 30% COMMENT: The negative hormone receptor study(ies) in this case has no internal positive control. REFERENCE RANGE ESTROGEN RECEPTOR NEGATIVE 0% POSITIVE =>1% REFERENCE RANGE PROGESTERONE RECEPTOR NEGATIVE 0% POSITIVE =>1% All controls stained appropriately Enid Cutter MD Pathologist, Electronic Signature ( Signed 05/01/2016) FLUORESCENCE IN-SITU HYBRIDIZATION Results: HER2 - **POSITIVE** RATIO OF HER2/CEP17 SIGNALS 2.25 AVERAGE HER2 COPY NUMBER PER CELL 8.45 1 of 3 FINAL for Labrie, Leaann P ([id number]) ADDITIONAL INFORMATION:(continued) Reference Range: NEGATIVE HER2/CEP17 Ratio <2.0 and average HER2 copy number <4.0 EQUIVOCAL HER2/CEP17 Ratio <2.0 and average HER2 copy number 4.0 and <6.0 POSITIVE HER2/CEP17 Ratio >=2.0 or <2.0 and average HER2 copy number >=6.0 Enid Cutter MD Pathologist, Electronic Signature ( Signed 04/30/2016)   PROCEDURES  ECHO 11/21/16  Impressions: - Normal LV size and systolic function, EF 17-49%. Normal diastolic   function. Global longitudinal strain less negative than prior but   images not as clean. Normal RV size and systolic function.   RADIOGRAPHIC STUDIES: I have personally reviewed the radiological images as listed and agreed with the findings in the report.   MRI Breast Bilateral 09/17/16 IMPRESSION: Smaller left  breast mass, now measuring 2.5 cm. No new or suspicious enhancement in either breast. RECOMMENDATION: Treatment plan.  MRI Abdomen W WO Contrast 08/16/16 IMPRESSION: Tiny sub-cm hepatic cysts. No evidence of metastatic disease or other acute findings. Tiny benign left adrenal adenoma and right adrenal myelolipoma.  Mammogram 08/09/16 IMPRESSION:  The 3.1 cm x 2 cm x 1.5 cm irregular equal density mass in the left breast is consistent with the known carcinoma showing mammographic evidence of preoperative chemotherapy response.   [place] of left breast 08/09/16 IMPRESSION:  The 2.7 cm lobulated mass in the left breast is a known biopsy positive for malignancy. Surgical and oncology consult in progress.   See onc history CT ABD/Pelvi 5/10/18s W CONTRAST 06/07/16 IMPRESSION: 1. Wall thickening of the descending and sigmoid colon consistent with an infectious or inflammatory colitis. 2. There is also a short segment of narrowing and possible wall thickening of the superior ascending colon which could reflect a constricting mass or, more likely, be  an area of persistent colonic spasm. This could be further assessed with either colonoscopy or a barium enema after the current symptoms of colitis have resolved. 3. Small, subcentimeter, low-density liver lesions which may all be benign. However, 3 these are not evident on prior CT. Liver metastatic disease possible. These could be further assessed with liver MRI with and without contrast. 4. Adrenal lesions which are stable, on the right and myelolipoma and on the left most likely an adenoma.  CT Angio Chest PE W and/or wo Contrast 05/13/16 IMPRESSION: No evidence of pulmonary emboli.  Echo 05/09/16 Study Conclusions -LF EF: 55-60% - Left ventricle: The cavity size was normal. There was mild focal   basal hypertrophy of the septum. Indeterminant diastolic   function. Systolic function was normal. The estimated ejection   fraction was in the  range of 55% to 60%. Wall motion was normal;   there were no regional wall motion abnormalities. GLS abnormal at   -13.3%, poor images however.  ASSESSMENT & PLAN: 56 y.o. post-menopausal Caucasian female with a self palpated left breast mass.  1. Breast cancer of upper-outer quadrant of left breast, invasive ductal carcinoma,  stage IIA (cT2N0M0) grade 3, ER-, PR-, HER2 amplified, ypT2N1a -We previously reviewed the patient's imaging and pathology. -We previously reviewed her staging and biology of her breast cancer  -We previously discussed that surgical resection is the definitive treatment for breast cancer, she was seen by breast surgeon Dr. Lucia Gaskins today, lumpectomy versus mastectomy were discussed with patient. -We previously discussed HER2 positive breast cancers total approximately 15% of breast cancers and happens to be more aggressive than HER2 negative cancers, especially ER and PR negative disease, she has high likelihood of cancer recurrence after complete surgical resection.  -I recommended neoadjuvant chemotherapy TCHP (docetaxel, carboplatin, Herceptin and perjeta) every 3 weeks for 6 cycles, followed by maintenance Herceptin and perjeta to complete 1 year therapy -Her baseline echo was normal, she was seen by cardiologist Dr. Benjamine Mola -She unfortunately developed severe diarrhea and cytopenia after chemotherapy, and was hospitalized for several times.  -Chemotherapy was subsequently postponed and dose reduced, and stopped after 4 months treatment. -She underwent left breast lumpectomy on 10/09/16 and the pathology results show significant residual disease, RCB-III -Due to her residual cancer and positive lymph node I also recommend adjuvant chemo Xeloda for 6 months.  She started Xeloda with concurrent radiation, tolerating well so far.  We will continue -Due to her significant residual disease after neoadjuvant chemotherapy, I recommend her to continue Herceptin and perjeta  maintenance therapy, to complete a total of one year therapy.  -Echo 11/21/16 LVEF 07-37%, normal systolic and diastolic function -She started adjuvant chemoradiation with Xeloda on 11/22/16 and completed on 01/10/17. She has experienced mild diarrhea with Xeloda, managed with imodium and lomotil. She has mild-moderate skin toxicity. OTC hydrocortisone topically to hands PRN to reduce inflammation was recommended.  -She is healing well from radiation -She will continue Xeloda at full dose 4 tabs BID for 2 weeks on and 1 week off, starting 01/18/17 to continue for about 4 months.  continue maintenance Herceptin and perjeta every 3 weeks.  -Lab reviewed, potassium at 3.3, but otherwise her labs are WNL. She will continue potassium BID.  -F/u on 02/08/17  2. Genetics -Given her strong family history of breast cancer, we recommend her to see genetic counseling to ruled out inheritable breast cancer syndrome. She agreed. -Genetic counseling scheduled but she has not been seen, I referred her again  -She  has not children. -She declined genetic testing at this time.  -She knows to continue cancer screenings.   3. Type 2 Diabetes mellitus, HTN -Managed by her PCP. -The patient has peripheral neuropathy in her feet from her diabetes. The patient is already on Neurontin with 200 mg at night. We previously discussed that chemotherapy may make her neuropathy worse. -Steroids will be given to reduce chemo side effects and I will reduce dexa to 5m daily to not affect her blood sugar much. -We'll monitor her blood glucose and blood pressure closely during her chemotherapy treatment. -DM and HTN, controlled.   4. Ulcerative colitis -We previously discussed that chemotherapy would cause diarrhea and the patient's colitis may exacerbate during chemo  -She is not taking steroids for this. She is on mesalamine, will follow up with Dr. SFuller Plan -She had severe diarrhea after first cycle chemotherapy, was treated  for ulcerative colitis flare -I previously advised the patient to keep herself adequately hydrated and to take Imodium PRN. - I again previously advised the patient to take Lomotil and Imodium frequently as needed. -Since change in chemo her diarrhea has resolved.    5. Arthritis  -s/p b/l hip replacement, last surgery in February 2018.  -Tylenol every 6-8 hours as needed was recommended.  -I encouraged her to f/u with PCP for arthritic pain management   6. Peripheral Neuropathy, secondary to chemotherapy -primarily typing and her neuropathy is worse following bouts of typing. I encouraged her to use her left hand more often and begin wearing cotton gloves.  -Encouraged taking B12 multivitamin to aid with this as well.  -She is currently on Gabapentin for her baseline neuropathy associated w/ her DM, she currently takes this TID.  -She also began vitamin B12 in combination with her Gabapentin. She also reports that she has been taking all three tablets of Gabapentin at night to aid with sleep. This has been helping her with sleep, but her neuropathy has still persisted throughout the day. -She has worsening neuropathy with mild to moderate decreased vibratory sense in hands/feet that is worse at night -she takes 200 mg Neurontin at night, may increase to 300-400 mg at bedtime   7. Hypokalemia  secondary to chemotherapy  -10/26/16 labs showed a 3.0 potassium, she increased potassium to BID for the next 5 days then back to once daily   -She now takes potassium BID, K 3.2 on 12/14/16 -Her potassium remains at 3.3 on 01/11/17. She will continue potassium BID. I also suggest she increase potassium in her diet.    PLAN -Begin Xeloda 5058m4 tabs BID, 2 weeks on and 1 week off on 01/18/17 -Lab, flush, f/u and Herceptin and Perjeta on 12/21 and 1/11  -f/u on 1/11, will cancel her f/u on 12/21    No orders of the defined types were placed in this encounter.  All questions were answered. The  patient knows to call the clinic with any problems, questions or concerns.  I spent 25 minutes counseling the patient face to face. The total time spent in the appointment was 30 minutes and more than 50% was on counseling.  This document serves as a record of services personally performed by YaTruitt MerleMD. It was created on her behalf by AmJoslyn Devona trained medical scribe. The creation of this record is based on the scribe's personal observations and the provider's statements to them.    I have reviewed the above documentation for accuracy and completeness, and I agree with the  above.   Truitt Merle, MD 01/11/2017

## 2017-01-11 ENCOUNTER — Ambulatory Visit (HOSPITAL_BASED_OUTPATIENT_CLINIC_OR_DEPARTMENT_OTHER): Payer: 59 | Admitting: Hematology

## 2017-01-11 ENCOUNTER — Other Ambulatory Visit (HOSPITAL_BASED_OUTPATIENT_CLINIC_OR_DEPARTMENT_OTHER): Payer: 59

## 2017-01-11 ENCOUNTER — Ambulatory Visit (HOSPITAL_BASED_OUTPATIENT_CLINIC_OR_DEPARTMENT_OTHER): Payer: 59

## 2017-01-11 ENCOUNTER — Encounter: Payer: Self-pay | Admitting: Hematology

## 2017-01-11 ENCOUNTER — Ambulatory Visit: Payer: 59 | Admitting: Hematology

## 2017-01-11 ENCOUNTER — Telehealth: Payer: Self-pay | Admitting: Hematology

## 2017-01-11 ENCOUNTER — Other Ambulatory Visit: Payer: 59

## 2017-01-11 VITALS — BP 114/74 | HR 75 | Temp 97.8°F | Resp 20 | Ht 64.5 in | Wt 217.3 lb

## 2017-01-11 DIAGNOSIS — K5190 Ulcerative colitis, unspecified, without complications: Secondary | ICD-10-CM | POA: Diagnosis not present

## 2017-01-11 DIAGNOSIS — Z452 Encounter for adjustment and management of vascular access device: Secondary | ICD-10-CM | POA: Diagnosis not present

## 2017-01-11 DIAGNOSIS — C50412 Malignant neoplasm of upper-outer quadrant of left female breast: Secondary | ICD-10-CM | POA: Diagnosis not present

## 2017-01-11 DIAGNOSIS — G620 Drug-induced polyneuropathy: Secondary | ICD-10-CM

## 2017-01-11 DIAGNOSIS — E119 Type 2 diabetes mellitus without complications: Secondary | ICD-10-CM

## 2017-01-11 DIAGNOSIS — Z171 Estrogen receptor negative status [ER-]: Secondary | ICD-10-CM

## 2017-01-11 DIAGNOSIS — I10 Essential (primary) hypertension: Secondary | ICD-10-CM

## 2017-01-11 DIAGNOSIS — Z95828 Presence of other vascular implants and grafts: Secondary | ICD-10-CM

## 2017-01-11 DIAGNOSIS — E876 Hypokalemia: Secondary | ICD-10-CM

## 2017-01-11 LAB — CBC WITH DIFFERENTIAL/PLATELET
BASO%: 0.2 % (ref 0.0–2.0)
Basophils Absolute: 0 10*3/uL (ref 0.0–0.1)
EOS%: 5.8 % (ref 0.0–7.0)
Eosinophils Absolute: 0.3 10*3/uL (ref 0.0–0.5)
HCT: 39.9 % (ref 34.8–46.6)
HGB: 12.8 g/dL (ref 11.6–15.9)
LYMPH%: 23.7 % (ref 14.0–49.7)
MCH: 29.5 pg (ref 25.1–34.0)
MCHC: 32.1 g/dL (ref 31.5–36.0)
MCV: 91.9 fL (ref 79.5–101.0)
MONO#: 0.4 10*3/uL (ref 0.1–0.9)
MONO%: 8.4 % (ref 0.0–14.0)
NEUT%: 61.9 % (ref 38.4–76.8)
NEUTROS ABS: 2.9 10*3/uL (ref 1.5–6.5)
Platelets: 182 10*3/uL (ref 145–400)
RBC: 4.34 10*6/uL (ref 3.70–5.45)
RDW: 17.8 % — ABNORMAL HIGH (ref 11.2–14.5)
WBC: 4.7 10*3/uL (ref 3.9–10.3)
lymph#: 1.1 10*3/uL (ref 0.9–3.3)

## 2017-01-11 LAB — COMPREHENSIVE METABOLIC PANEL
ALBUMIN: 3.5 g/dL (ref 3.5–5.0)
ALK PHOS: 99 U/L (ref 40–150)
ALT: 14 U/L (ref 0–55)
AST: 16 U/L (ref 5–34)
Anion Gap: 14 mEq/L — ABNORMAL HIGH (ref 3–11)
BUN: 14.7 mg/dL (ref 7.0–26.0)
CALCIUM: 9.4 mg/dL (ref 8.4–10.4)
CO2: 25 mEq/L (ref 22–29)
Chloride: 103 mEq/L (ref 98–109)
Creatinine: 0.9 mg/dL (ref 0.6–1.1)
EGFR: >60 mL/min/{1.73_m2} (ref >60)
Glucose: 114 mg/dl (ref 70–140)
Potassium: 3.3 mEq/L — ABNORMAL LOW (ref 3.5–5.1)
Sodium: 141 mEq/L (ref 136–145)
TOTAL PROTEIN: 6.7 g/dL (ref 6.4–8.3)
Total Bilirubin: 0.58 mg/dL (ref 0.20–1.20)

## 2017-01-11 MED ORDER — SODIUM CHLORIDE 0.9% FLUSH
10.0000 mL | Freq: Once | INTRAVENOUS | Status: AC
  Administered 2017-01-11: 10 mL
  Filled 2017-01-11: qty 10

## 2017-01-11 NOTE — Telephone Encounter (Signed)
Gave avs and calendar for december and January 2019

## 2017-01-16 ENCOUNTER — Encounter: Payer: Self-pay | Admitting: Radiation Oncology

## 2017-01-17 NOTE — Progress Notes (Signed)
  Radiation Oncology         [phone]) [phone] ________________________________  Name: Brandi Dickson MRN: [id number]  Date: 01/10/2017  DOB: 06-08-1960  End of Treatment Note  Diagnosis:   56 y.o. female with stage IIA, ypT2 ypN1a ER+, PR-, HER2+ grade 3 invasive ductal carcinoma with DCIS of the left breast   Indication for treatment:  Curative       Radiation treatment dates:   11/22/2016 - 01/10/2017  Site/dose:    1. The patient received 50.4 Gy in 28 fractions to the left breast.  2. The axillary region received 45 Gy in 25 fractions.  3. The patient then proceeded with a 10 Gy boost to the lumpectomy cavity in 5 fractions for a cumulative dose to the lumpectomy cavity of 60.4 Gy.  Beams/energy:    1. 3D // 10X, 15X Photon 2. 3D // 10X, 6X Photon 2. 3D // 15E Photon  Narrative: The patient tolerated radiation treatment relatively well.   She experienced mild fatigue and skin irritation. She reported having moderate pain due to skin irritation. The skin to her left breast was erythematous with dry desquamation over the nipple area. Moist desquamation was noted on the skin to her left supraclavicular fossa. She is using radiaplex and silvadene as directed on the desquamated areas.  Plan: The patient has completed radiation treatment. The patient will return to radiation oncology clinic for routine followup in one month. I advised them to call or return sooner if they have any questions or concerns related to their recovery or treatment.  -----------------------------------  Blair Promise, PhD, MD  This document serves as a record of services personally performed by Gery Pray, MD. It was created on his behalf by Rae Lips, a trained medical scribe. The creation of this record is based on the scribe's personal observations and the provider's statements to them. This document has been checked and approved by the attending provider.

## 2017-01-18 ENCOUNTER — Other Ambulatory Visit: Payer: 59

## 2017-01-18 ENCOUNTER — Ambulatory Visit: Payer: 59 | Admitting: Nurse Practitioner

## 2017-01-18 ENCOUNTER — Ambulatory Visit (HOSPITAL_BASED_OUTPATIENT_CLINIC_OR_DEPARTMENT_OTHER): Payer: 59

## 2017-01-18 VITALS — BP 105/79 | HR 63 | Temp 98.5°F | Resp 18 | Ht 64.5 in | Wt 218.1 lb

## 2017-01-18 DIAGNOSIS — Z171 Estrogen receptor negative status [ER-]: Principal | ICD-10-CM

## 2017-01-18 DIAGNOSIS — C50412 Malignant neoplasm of upper-outer quadrant of left female breast: Secondary | ICD-10-CM

## 2017-01-18 DIAGNOSIS — Z5112 Encounter for antineoplastic immunotherapy: Secondary | ICD-10-CM | POA: Diagnosis not present

## 2017-01-18 LAB — COMPREHENSIVE METABOLIC PANEL
ALT: 15 U/L (ref 0–55)
ANION GAP: 12 meq/L — AB (ref 3–11)
AST: 17 U/L (ref 5–34)
Albumin: 3.6 g/dL (ref 3.5–5.0)
Alkaline Phosphatase: 99 U/L (ref 40–150)
BILIRUBIN TOTAL: 0.68 mg/dL (ref 0.20–1.20)
BUN: 18.6 mg/dL (ref 7.0–26.0)
CALCIUM: 9.1 mg/dL (ref 8.4–10.4)
CO2: 25 meq/L (ref 22–29)
CREATININE: 0.9 mg/dL (ref 0.6–1.1)
Chloride: 101 mEq/L (ref 98–109)
Glucose: 102 mg/dl (ref 70–140)
Potassium: 3.5 mEq/L (ref 3.5–5.1)
Sodium: 138 mEq/L (ref 136–145)
TOTAL PROTEIN: 6.9 g/dL (ref 6.4–8.3)

## 2017-01-18 LAB — CBC WITH DIFFERENTIAL/PLATELET
BASO%: 0.5 % (ref 0.0–2.0)
BASOS ABS: 0 10*3/uL (ref 0.0–0.1)
EOS%: 8.3 % — AB (ref 0.0–7.0)
Eosinophils Absolute: 0.5 10*3/uL (ref 0.0–0.5)
HEMATOCRIT: 38.2 % (ref 34.8–46.6)
HEMOGLOBIN: 12.5 g/dL (ref 11.6–15.9)
LYMPH#: 1.4 10*3/uL (ref 0.9–3.3)
LYMPH%: 23.9 % (ref 14.0–49.7)
MCH: 30.1 pg (ref 25.1–34.0)
MCHC: 32.7 g/dL (ref 31.5–36.0)
MCV: 92 fL (ref 79.5–101.0)
MONO#: 0.6 10*3/uL (ref 0.1–0.9)
MONO%: 10.8 % (ref 0.0–14.0)
NEUT#: 3.3 10*3/uL (ref 1.5–6.5)
NEUT%: 56.5 % (ref 38.4–76.8)
Platelets: 178 10*3/uL (ref 145–400)
RBC: 4.15 10*6/uL (ref 3.70–5.45)
RDW: 18.1 % — AB (ref 11.2–14.5)
WBC: 5.8 10*3/uL (ref 3.9–10.3)

## 2017-01-18 MED ORDER — SODIUM CHLORIDE 0.9 % IV SOLN
420.0000 mg | Freq: Once | INTRAVENOUS | Status: AC
  Administered 2017-01-18: 420 mg via INTRAVENOUS
  Filled 2017-01-18: qty 14

## 2017-01-18 MED ORDER — TRASTUZUMAB CHEMO 150 MG IV SOLR
600.0000 mg | Freq: Once | INTRAVENOUS | Status: AC
  Administered 2017-01-18: 600 mg via INTRAVENOUS
  Filled 2017-01-18: qty 28.57

## 2017-01-18 MED ORDER — SODIUM CHLORIDE 0.9% FLUSH
10.0000 mL | INTRAVENOUS | Status: DC | PRN
  Administered 2017-01-18: 10 mL
  Filled 2017-01-18: qty 10

## 2017-01-18 MED ORDER — SODIUM CHLORIDE 0.9 % IV SOLN
Freq: Once | INTRAVENOUS | Status: AC
  Administered 2017-01-18: 10:00:00 via INTRAVENOUS

## 2017-01-18 MED ORDER — HEPARIN SOD (PORK) LOCK FLUSH 100 UNIT/ML IV SOLN
500.0000 [IU] | Freq: Once | INTRAVENOUS | Status: AC | PRN
  Administered 2017-01-18: 500 [IU]
  Filled 2017-01-18: qty 5

## 2017-01-18 MED ORDER — ACETAMINOPHEN 325 MG PO TABS
ORAL_TABLET | ORAL | Status: AC
  Filled 2017-01-18: qty 2

## 2017-01-18 MED ORDER — ACETAMINOPHEN 325 MG PO TABS
650.0000 mg | ORAL_TABLET | Freq: Once | ORAL | Status: AC
  Administered 2017-01-18: 650 mg via ORAL

## 2017-01-18 NOTE — Patient Instructions (Signed)
[facility] Discharge Instructions for Patients Receiving Chemotherapy  Today you received the following chemotherapy agents :  Herceptin, Perjeta.  To help prevent nausea and vomiting after your treatment, we encourage you to take your nausea medication as prescribed.   If you develop nausea and vomiting that is not controlled by your nausea medication, call the clinic.   BELOW ARE SYMPTOMS THAT SHOULD BE REPORTED IMMEDIATELY:  *FEVER GREATER THAN 100.5 F  *CHILLS WITH OR WITHOUT FEVER  NAUSEA AND VOMITING THAT IS NOT CONTROLLED WITH YOUR NAUSEA MEDICATION  *UNUSUAL SHORTNESS OF BREATH  *UNUSUAL BRUISING OR BLEEDING  TENDERNESS IN MOUTH AND THROAT WITH OR WITHOUT PRESENCE OF ULCERS  *URINARY PROBLEMS  *BOWEL PROBLEMS  UNUSUAL RASH Items with * indicate a potential emergency and should be followed up as soon as possible.  Feel free to call the clinic should you have any questions or concerns. The clinic phone number is [phone].  Please show the [place] at check-in to the Emergency Department and triage nurse.

## 2017-01-31 ENCOUNTER — Other Ambulatory Visit: Payer: Self-pay | Admitting: Hematology

## 2017-01-31 DIAGNOSIS — C50412 Malignant neoplasm of upper-outer quadrant of left female breast: Secondary | ICD-10-CM

## 2017-02-07 NOTE — Progress Notes (Signed)
[place]  [phone] [phone]  Clinic Follow Up Note   Patient Care Team: Orpah Melter, MD as PCP - General (Family Medicine) [name], MD as Consulting Physician (General Surgery) Truitt Merle, MD as Consulting Physician (Hematology) Gery Pray, MD as Consulting Physician (Radiation Oncology) Ladene Artist, MD as Consulting Physician (Gastroenterology)   Date of Service:  02/08/2017   CHIEF COMPLAINTS:  Follow up left breast cancer  Oncology History   Cancer Staging Breast cancer of upper-outer quadrant of left female breast [facility]) Staging form: Breast, AJCC 8th Edition - Clinical stage from 04/25/2016: Stage IIA (cT2, cN0, cM0, G3, ER: Negative, PR: Negative, HER2: Positive) - Signed by Truitt Merle, MD on 05/02/2016 - Pathologic stage from 10/09/2016: No Stage Recommended (ypT2, pN1a, cM0, G3, ER: Negative, PR: Negative, HER2: Positive) - Signed by Truitt Merle, MD on 10/26/2016       Breast cancer of upper-outer quadrant of left female breast ([place])   04/24/2016 Mammogram    Category B breasts with a new irregular mass in the UOQ left breast middle depth. Ultrasound revealed a 3.9 cm mass in the 1:00 position. The left axilla was negative.       04/25/2016 Initial Biopsy    Biopsy of the left breast showed grade 3 invasive ductal carcinoma, DCIS, and lymphovascular invasion was present.      04/25/2016 Receptors her2    ER 0% negative, PR 0% negative, HER2 positive, Ki67 30%      05/02/2016 Initial Diagnosis    Breast cancer of upper-outer quadrant of left female breast ([place])      05/07/2016 Imaging    MRI of the bilateral breast 05/07/16 IMPRESSION: Lobulated enhancing mass (4.0 x 2.7 x 3.1 cm) in the upper-outer quadrant of the left breast corresponding with the recently diagnosed invasive mammary carcinoma. Linear enhancement extends 2.6 cm posterior to the mass worrisome for ductal carcinoma in-situ.      05/09/2016  Echocardiogram    Echo 05/09/16 -LF EF: 55-60%      05/11/2016 - 08/31/2016 Neo-Adjuvant Chemotherapy    Cycle 1 Docetaxel, Carboplatin, Herceptin and pejeta (TCHP), cycle 2 Docetaxel changed to Taxol, carbo dose reduced to AUC 4.5 (from 5) due to severe diarrhea and cytopenia, Perjeta held after cycle 2.   Due to poor toleration, [name] was held after cycle 4 and Taxol changed to weekly X4. Herceptin was continued during the above chemo.        05/13/2016 Imaging    CT Angio Chest PE IMPRESSION: No evidence of pulmonary emboli. Left breast mass consistent with the given clinical history. Stable left adrenal lesion likely representing a small adenoma.      05/18/2016 - 05/24/2016 Hospital Admission    Patient presented with nausea, vomiting, and diarrhea; admitted to hospital with Hyponatremia      06/07/2016 - 06/09/2016 Hospital Admission    Patient presents to hospital complaints of rectal bleeding and abdominal cramps when defacating      06/07/2016 Imaging    CT ABDOMEN PELVIS W CONTRAST  IMPRESSION: 1. Wall thickening of the descending and sigmoid colon consistent with an infectious or inflammatory colitis. 2. There is also a short segment of narrowing and possible wall thickening of the superior ascending colon which could reflect a constricting mass or, more likely, be an area of persistent colonic spasm. This could be further assessed with either colonoscopy or a barium enema after the current symptoms of colitis have resolved. 3. Small, subcentimeter, low-density liver  lesions which may all be benign. However, 3 these are not evident on prior CT. Liver metastatic disease possible. These could be further assessed with liver MRI with and without contrast. 4. Adrenal lesions which are stable, on the right and myelolipoma and on the left most likely an adenoma.      06/07/2016 Imaging    CT A/P IMPRESSION: 1. Wall thickening of the descending and sigmoid colon  consistent with an infectious or inflammatory colitis. 2. There is also a short segment of narrowing and possible wall thickening of the superior ascending colon which could reflect a constricting mass or, more likely, be an area of persistent colonic spasm. This could be further assessed with either colonoscopy or a barium enema after the current symptoms of colitis have resolved. 3. Small, subcentimeter, low-density liver lesions which may all be benign. However, 3 these are not evident on prior CT. Liver metastatic disease possible. These could be further assessed with liver MRI with and without contrast. 4. Adrenal lesions which are stable, on the right and myelolipoma and on the left most likely an adenoma.      06/07/2016 - 06/09/2016 Hospital Admission    Diarrhea and rectal Bleeding      07/19/2016 - 07/20/2016 Hospital Admission    Diarrhea and dehydration      08/09/2016 Mammogram    Mammogram 08/09/16 IMPRESSION:  The 3.1 cm x 2 cm x 1.5 cm irregular equal density mass in the left breast is consistent with the known carcinoma showing mammographic evidence of preoperative chemotherapy response.         08/09/2016 Imaging    [place] of left breast 08/09/16 IMPRESSION:  The 2.7 cm lobulated mass in the left breast is a known biopsy positive for malignancy. Surgical and oncology consult in progress.      08/16/2016 Imaging    MRI Abdomen W WO Contrast IMPRESSION: Tiny sub-cm hepatic cysts. No evidence of metastatic disease or other acute findings. Tiny benign left adrenal adenoma and right adrenal myelolipoma.      09/14/2016 -  Chemotherapy    Maintenance Herceptin every 3 weeks. Added Perjeta back on 10/26/16      09/17/2016 Imaging    MRI Breast Bilateral 09/17/16 IMPRESSION: Smaller left breast mass, now measuring 2.5 cm. No new or suspicious enhancement in either breast.  RECOMMENDATION: Treatment plan.      10/09/2016 Surgery    LEFT BREAST LUMPECTOMY WITH  RADIOACTIVE SEED AND L4EFT AXILLARY SENTINEL LYMPH NODE BIOPSY by Dr. Lucia Gaskins on 10/09/16      10/09/2016 Pathology Results    Diagnosis 10/09/16 1. Breast, lumpectomy, Left - INVASIVE DUCTAL CARCINOMA, GRADE 3, SPANNING 2.4 CM. - HIGH GRADE DUCTAL CARCINOMA IN SITU WITH NECROSIS. - RESECTION MARGINS ARE NEGATIVE FOR CARCINOMA. - BIOPSY SITE. - SEE ONCOLOGY TABLE. 2. Breast, excision, Left additional medial margin - FIBROCYSTIC CHANGE. - NO MALIGNANCY IDENTIFIED. 3. Lymph node, sentinel, biopsy, Left Axillary - METASTATIC CARCINOMA IN ONE OF ONE LYMPH NODES (1/1).        11/22/2016 - 01/10/2017 Radiation Therapy    Radiation with Dr. Sondra Come and complete on 01/10/17      11/22/2016 -  Chemotherapy    Xeloda With radiation she will be on low dose 4 tablets in the am and 3 tablets in the pm. Starting 11/22/16 and complete on 01/10/17.   Will continue Xeloda, 14 days on and 7 days off, 4 tablets BID starting 01/18/17        HISTORY OF PRESENTING  ILLNESS (05/02/16):  [name] 57 y.o. female is here because of a new diagnosis of left breast cancer. She is accompanied by her husband to our multidisciplinary breast clinic today.  The patient presented with a palpable left breast lump approximately 2 weeks ago. Bilateral diagnostic mammogram on 04/24/16 showed Category B breasts with a new irregular mass in the UOQ left breast middle depth. Ultrasound performed on 04/24/16 revealed a 3.9 cm mass in the 1:00 position. The left axilla was negative.  Biopsy of the left breast on 04/25/16 showed grade 3 invasive ductal carcinoma, DCIS, and lymphovascular invasion was present (ER 0% negative, PR 0% negative, HER2 positive, Ki67 30%).  She denies tenderness of the biopsied area. Reports minor dimpling. The patient is 8 weeks out from a left hip replacement. She had a right hip replacement 2010 years ago.  The patient and her husband present today in multidisciplinary breast clinic to  discuss treatment options for the management of her disease.  The patient is taking Neurontin for neuropathy in her feet from diabetes. She has been diagnosed for diabetes for the past 4 years. She states she only has neuropathy at night and take 2 Neurontin at night and then has no symptoms.  GYN HISTORY  Menarchal: 11 LMP: Complete hysterectomy for uterine fibroids ~ 2009. She was bleeding too much with her last menstrual cycle lasting 6 weeks. No issues with menopause. Contraceptive: no  HRT: No GP: G1P0  CURRENT THERAPY:  1. Maintenance Herceptin every 3 weeks, started on 06/01/16. Add Perjeta back starting 10/26/16. 2. Xeloda With concurrent with 2036m in am and 15044mpm, started 11/22/16 and completed on 01/10/17.  3. Continue Xeloda 200018m12h, 14 days on and 7 days off,  starting 01/18/17 for 4 months. Dose reduced to 2000m74m and 1500mg82mfrom cycle 2 due to severe diarrhea    INTERVAL HISTORY:  RebecGWENDLYON ZUMBROrns for follow up. She is doing okay overall. She reports that during her last cycle of Xeloda she had severe diarrhea at the end of the second week and she did not take day 14 of the cycle. During that time she did not feel dehydrated and drank plenty of water. She is able to eat normal food and she reports that her bowel movements are almost back to normal as of today. She notes that the skin on her hands is darker than normal and the skin is a little irritated.   Pt also reports that she had a cold recently for which she treated with Zicam, vitamin C, and Alka-seltzer. She is doing well today.   On review of systems, pt denies diarrhea today or any other complaints at this time. Pertinent positives are listed and detailed within the above HPI.    MEDICAL HISTORY:  Past Medical History:  Diagnosis Date  . Arthritis   . Asthma    triggered with EsteeMindi Curlingumes and cigarette smoke  . Cancer (HCC) [place] Colitis   . Diabetes mellitus without complication  (HCC) [place] Hypertension   . Neuropathy     SURGICAL HISTORY: Past Surgical History:  Procedure Laterality Date  . ABDOMINAL HYSTERECTOMY    . BREAST LUMPECTOMY WITH RADIOACTIVE SEED AND SENTINEL LYMPH NODE BIOPSY Left 10/09/2016   Procedure: LEFT BREAST LUMPECTOMY WITH RADIOACTIVE SEED AND L4EFT AXILLARY SENTINEL LYMPH NODE BIOPSY;  Surgeon: NewmaAlphonsa Overall  [facility];  Laterality: Left;  . DILATION AND CURETTAGE  OF UTERUS    . KNEE ARTHROSCOPY Left   . PORTACATH PLACEMENT Right 05/08/2016   Procedure: INSERTION PORT-A-CATH WITH [place];  Surgeon: [name], MD;  Location: [facility];  Service: General;  Laterality: Right;  . TONSILLECTOMY    . TOTAL HIP ARTHROPLASTY Right   . TOTAL HIP ARTHROPLASTY Left 03/06/2016   Procedure: LEFT TOTAL HIP ARTHROPLASTY ANTERIOR APPROACH;  Surgeon: [name], MD;  Location: [facility];  Service: Orthopedics;  Laterality: Left;    SOCIAL HISTORY: Social History   Socioeconomic History  . Marital status: Married    Spouse name: Not on file  . Number of children: 0  . Years of education: Not on file  . Highest education level: Not on file  Social Needs  . Financial resource strain: Not on file  . Food insecurity - worry: Not on file  . Food insecurity - inability: Not on file  . Transportation needs - medical: Not on file  . Transportation needs - non-medical: Not on file  Occupational History  . Not on file  Tobacco Use  . Smoking status: Former Smoker    Packs/day: 1.00    Years: 29.00    Pack years: 29.00    Types: Cigarettes    Last attempt to quit: 10/27/2002    Years since quitting: 14.2  . Smokeless tobacco: Never Used  Substance and Sexual Activity  . Alcohol use: No    Alcohol/week: 0.0 oz  . Drug use: No  . Sexual activity: Yes    Birth control/protection: Surgical  Other Topics Concern  . Not on file  Social History Narrative  . Not on file    FAMILY  HISTORY: Family History  Problem Relation Age of Onset  . Colon cancer Father   . Stomach cancer Paternal Uncle   . Stomach cancer Paternal Uncle   . Melanoma Brother   . Thyroid cancer Brother   . Breast cancer Maternal Aunt   . Breast cancer Maternal Aunt   . Breast cancer Cousin     ALLERGIES:  is allergic to lisinopril; augmentin [amoxicillin-pot clavulanate]; benadryl [diphenhydramine]; and losartan potassium.  MEDICATIONS:  Current Outpatient Medications  Medication Sig Dispense Refill  . albuterol (PROVENTIL HFA;VENTOLIN HFA) 108 (90 Base) MCG/ACT inhaler Inhale 1-2 puffs into the lungs every 6 (six) hours as needed for wheezing or shortness of breath.    . capecitabine (XELODA) 500 MG tablet Take  4 tabs = 2000 mg  BID  For  2  Weeks  On  ,  1  Week  Off. 112 tablet 1  . diazepam (VALIUM) 5 MG tablet Take 1 tablet (5 mg total) by mouth as directed. Take 5 mg by mouth 30 minutes prior to MRI procedure.   May repeat x 1. DO NOT Drive after taking Valium as it may cause drowsiness. 2 tablet 0  . dicyclomine (BENTYL) 20 MG tablet Take 1 tablet (20 mg total) by mouth 4 (four) times daily -  before meals and at bedtime. 30 tablet 0  . diphenoxylate-atropine (LOMOTIL) 2.5-0.025 MG tablet Take one to two tablets every six hours as needed for diarrhea 120 tablet 1  . ferrous sulfate 325 (65 FE) MG tablet Take 325 mg by mouth 2 (two) times daily with a meal.    . gabapentin (NEURONTIN) 100 MG capsule Take 200 mg by mouth at bedtime.     . Homeopathic Products (ZICAM ALLERGY RELIEF NA) Take 1 tablet by mouth daily.    [name] Kitchen  HYDROcodone-acetaminophen (NORCO/VICODIN) 5-325 MG tablet Take 1-2 tablets by mouth every 6 (six) hours as needed for moderate pain. (Patient not taking: Reported on 01/11/2017) 20 tablet 0  . hydrocortisone (ANUSOL-HC) 25 MG suppository Place 1 suppository (25 mg total) rectally 2 (two) times daily. (Patient taking differently: Place 25 mg rectally as needed. ) 12  suppository 0  . lidocaine-prilocaine (EMLA) cream Apply 1 application to skin 1.5 to 2 hrs before use.  Cover to secure cream with plastic wrap. 30 g PRN  . loperamide (IMODIUM) 1 MG/5ML solution Take 3 mg by mouth as needed for diarrhea or loose stools.    [name] Kitchen LORazepam (ATIVAN) 0.5 MG tablet Take 1 tablet (0.5 mg total) by mouth once as needed for anxiety. (Patient not taking: Reported on 01/11/2017) 2 tablet 0  . magnesium oxide (MAG-OX) 400 MG tablet Take 400 mg by mouth daily.     . Mesalamine 800 MG TBEC Take 1,600 mg by mouth 2 (two) times daily.    . metFORMIN (GLUCOPHAGE-XR) 500 MG 24 hr tablet Take 1,000 mg by mouth at bedtime.    . metoprolol (LOPRESSOR) 50 MG tablet Take 50 mg by mouth 2 (two) times daily. Takes 1 in the am and 1 at HS    . niacin 250 MG tablet Take 250 mg by mouth at bedtime.     . ondansetron (ZOFRAN) 8 MG tablet Take 1 tablet (8 mg total) by mouth 2 (two) times daily as needed for refractory nausea / vomiting. Start on day 3 after chemo. (Patient not taking: Reported on 12/26/2016) 30 tablet 1  . potassium chloride SA (K-DUR,KLOR-CON) 20 MEQ tablet Take 1 tablet (20 mEq total) by mouth 2 (two) times daily. 60 tablet 2  . prochlorperazine (COMPAZINE) 10 MG tablet Take 1 tablet (10 mg total) by mouth every 6 (six) hours as needed (Nausea or vomiting). (Patient not taking: Reported on 12/26/2016) 30 tablet 1  . traMADol (ULTRAM) 50 MG tablet Take 1-2 tablets (50-100 mg total) by mouth every 6 (six) hours as needed. (Patient not taking: Reported on 12/26/2016) 40 tablet 0  . triamterene-hydrochlorothiazide (MAXZIDE) 75-50 MG per tablet Take 0.5 tablets by mouth daily.      No current facility-administered medications for this visit.    Facility-Administered Medications Ordered in Other Visits  Medication Dose Route Frequency Provider Last Rate Last Dose  . heparin lock flush 100 unit/mL  500 Units Intracatheter Once PRN Truitt Merle, MD      . sodium chloride flush (NS)  0.9 % injection 10 mL  10 mL Intracatheter PRN Truitt Merle, MD      . sodium chloride flush (NS) 0.9 % injection 10 mL  10 mL Intracatheter PRN Truitt Merle, MD   10 mL at 08/24/16 1725  . sodium chloride flush (NS) 0.9 % injection 10 mL  10 mL Intracatheter PRN Truitt Merle, MD   10 mL at 11/16/16 1628  . sodium chloride flush (NS) 0.9 % injection 10 mL  10 mL Intracatheter PRN Truitt Merle, MD   10 mL at 02/08/17 1457   REVIEW OF SYSTEMS:   Constitutional: Denies fevers, chills or abnormal night sweats  Eyes: Denies blurriness of vision, double vision or watery eyes Ears, nose, mouth, throat, and face: Denies mucositis or sore throat Respiratory: Denies cough, dyspnea or wheezes Cardiovascular: Denies palpitation.  Gastrointestinal:  Denies heartburn. Denies nausea, vomiting. (+) mild diarrhea, manageable  Skin: Denies abnormal skin rashes Breast: (+) diffuse erythema on left breast and axilla  and skin peeling from radiation Lymphatics: Denies new lymphadenopathy or easy bruising Neurological: (+) neuropathy in hands/feet, unchanged Behavioral/Psych: Mood is stable, no new changes  All other systems were reviewed with the patient and are negative.   PHYSICAL EXAMINATION:  ECOG PERFORMANCE STATUS: 1 - Symptomatic but completely ambulatory  Vitals:   02/08/17 1103  BP: 107/71  Pulse: 71  Resp: 18  Temp: 99 F (37.2 C)  SpO2: 99%   Filed Weights   02/08/17 1103  Weight: 214 lb 14.4 oz (97.5 kg)     GENERAL:alert, no distress and comfortable SKIN: skin color, texture, turgor are normal, no rashes or significant lesions EYES: normal, conjunctiva are pink and non-injected, sclera clear OROPHARYNX:no exudate, no erythema and lips, buccal mucosa, and tongue normal  NECK: supple, thyroid normal size, non-tender, without nodularity LYMPH:  no palpable lymphadenopathy in the cervical, axillary or inguinal LUNGS: clear to auscultation and percussion with normal breathing effort HEART: regular  rate & rhythm and no murmurs and no lower extremity edema ABDOMEN:abdomen soft, non-tender and normal bowel sounds Musculoskeletal:no cyanosis of digits and no clubbing  PSYCH: alert & oriented x 3 with fluent speech NEURO: no focal motor/sensory deficits BREAST: (+) s/p lumpectomy inc in UOQ of left breast, healing well, no discharge, palpable mass or scar. Skin hyperpigmentation on left breast from radiation, no skin ulcers or skin breakdown.   LABORATORY DATA:  I have reviewed the data as listed CBC Latest Ref Rng & Units 02/08/2017 01/18/2017 01/11/2017  WBC 3.9 - 10.3 K/uL 5.6 5.8 4.7  Hemoglobin 11.6 - 15.9 g/dL 12.3 12.5 12.8  Hematocrit 34.8 - 46.6 % 37.6 38.2 39.9  Platelets 145 - 400 K/uL 219 178 182   CMP Latest Ref Rng & Units 02/08/2017 01/18/2017 01/11/2017  Glucose 70 - 140 mg/dL 97 102 114  BUN 7 - 26 mg/dL 13 18.6 14.7  Creatinine 0.60 - 1.10 mg/dL 0.88 0.9 0.9  Sodium 136 - 145 mmol/L 140 138 141  Potassium 3.3 - 4.7 mmol/L 3.3 3.5 3.3(L)  Chloride 98 - 109 mmol/L 101 - -  CO2 22 - 29 mmol/L 29 25 25   Calcium 8.4 - 10.4 mg/dL 9.2 9.1 9.4  Total Protein 6.4 - 8.3 g/dL 6.6 6.9 6.7  Total Bilirubin 0.2 - 1.2 mg/dL 0.5 0.68 0.58  Alkaline Phos 40 - 150 U/L 96 99 99  AST 5 - 34 U/L 18 17 16   ALT 0 - 55 U/L 13 15 14    PATHOLOGY REPORT:    Diagnosis 10/09/16 1. Breast, lumpectomy, Left - INVASIVE DUCTAL CARCINOMA, GRADE 3, SPANNING 2.4 CM. - HIGH GRADE DUCTAL CARCINOMA IN SITU WITH NECROSIS. - RESECTION MARGINS ARE NEGATIVE FOR CARCINOMA. - BIOPSY SITE. - SEE ONCOLOGY TABLE. 2. Breast, excision, Left additional medial margin - FIBROCYSTIC CHANGE. - NO MALIGNANCY IDENTIFIED. 3. Lymph node, sentinel, biopsy, Left Axillary - METASTATIC CARCINOMA IN ONE OF ONE LYMPH NODES (1/1). Microscopic Comment 1. BREAST, STATUS POST NEOADJUVANT TREATMENT Procedure: Left lumpectomy with additional medial margin excision, left axillary sentinel lymph node biopsy. Laterality:  Left. Tumor Size: 2.4 cm. Histologic Type: Invasive ductal carcinoma. Grade: 3 Tubular Differentiation: 3 Nuclear Pleomorphism: 3 Mitotic Count: 3 Ductal Carcinoma in Situ (DCIS): Present, high grade with necrosis. Regional Lymph Nodes: Number of Lymph Nodes Examined: 1 Number of Sentinel Lymph Nodes Examined: 1 Lymph Nodes with Macrometastases: 1 Lymph Nodes with Micrometastases: 0 Lymph Nodes with Isolated Tumor Cells: 0 Margins: Invasive carcinoma, distance from closest margin: >0.5 cm all original margins.  DCIS, distance from closest margin: >0.5 cm all original margins. Extent of Tumor: Confined to breast parenchyma. Breast Prognostic Profile (pre-neoadjuvant case #: [id number], see below). Estrogen Receptor: Negative. Progesterone Receptor: Negative. Her2: Positive(ratio 2.25). Ki-67: 30% 2 of 4 FINAL for Tirrell, Andra P ([id number]) Microscopic Comment(continued) Will be repeated on the current case (Block #: [id number]) and the results reported separately. Residual Cancer Burden (RCB): Primary Tumor Bed: 24 mm x 20 mm Overall Cancer Cellularity: 75% Percentage of Cancer that is in Situ: 5% Number of Positive Lymph Nodes: 1 Diameter of Largest Lymph Node metastasis: 3 mm Residual Cancer Burden : 3.439 Residual Cancer Burden Class: RCB-III Pathologic Stage Classification (p TNM, AJCC 8th Edition): Primary Tumor (ypT): ypT2 Regional Lymph Nodes (ypN): ypN1a 1. PROGNOSTIC INDICATORS Results: IMMUNOHISTOCHEMICAL AND MORPHOMETRIC ANALYSIS PERFORMED MANUALLY Estrogen Receptor: 30%, POSITIVE, WEAK STAINING INTENSITY Progesterone Receptor: 0%, NEGATIVE COMMENT: The negative hormone receptor study(ies) in this case has an internal positive control. REFERENCE RANGE ESTROGEN RECEPTOR NEGATIVE 0% POSITIVE =>1% REFERENCE RANGE PROGESTERONE RECEPTOR NEGATIVE 0% POSITIVE =>1% All controls stained appropriately Enid Cutter MD Pathologist, Electronic Signature ( Signed  10/16/2016) 1. FLUORESCENCE IN-SITU HYBRIDIZATION Results: HER2 - **POSITIVE** RATIO OF HER2/CEP17 SIGNALS 2.67 AVERAGE HER2 COPY NUMBER PER CELL 4.00 Reference Range: NEGATIVE HER2/CEP17 Ratio <2.0 and average HER2 copy number <4.0 L DIAG NOSIS Diagnosis 04/25/16 Breast, left, needle core biopsy - INVASIVE DUCTAL CARCINOMA, SEE COMMENT. - DUCTAL CARCINOMA IN SITU. - LYMPHOVASCULAR INVASION PRESENT. Microscopic Comment The carcinoma appears grade 3 with focal squamous differentiation. Prognostic markers will be ordered. Dr. Lyndon Code has reviewed the case. The case was called to Dr. Isaiah Blakes on 04/26/2016. Vicente Males MD Pathologist, Electronic Signature (Case signed 04/26/2016) ADDITIONAL INFORMATION: 04/25/16 PROGNOSTIC INDICATORS Results: IMMUNOHISTOCHEMICAL AND MORPHOMETRIC ANALYSIS PERFORMED MANUALLY Estrogen Receptor: 0%, NEGATIVE Progesterone Receptor: 0%, NEGATIVE Proliferation Marker Ki67: 30% COMMENT: The negative hormone receptor study(ies) in this case has no internal positive control. REFERENCE RANGE ESTROGEN RECEPTOR NEGATIVE 0% POSITIVE =>1% REFERENCE RANGE PROGESTERONE RECEPTOR NEGATIVE 0% POSITIVE =>1% All controls stained appropriately Enid Cutter MD Pathologist, Electronic Signature ( Signed 05/01/2016) FLUORESCENCE IN-SITU HYBRIDIZATION Results: HER2 - **POSITIVE** RATIO OF HER2/CEP17 SIGNALS 2.25 AVERAGE HER2 COPY NUMBER PER CELL 8.45 1 of 3 FINAL for Nishi, Emalea P ([id number]) ADDITIONAL INFORMATION:(continued) Reference Range: NEGATIVE HER2/CEP17 Ratio <2.0 and average HER2 copy number <4.0 EQUIVOCAL HER2/CEP17 Ratio <2.0 and average HER2 copy number 4.0 and <6.0 POSITIVE HER2/CEP17 Ratio >=2.0 or <2.0 and average HER2 copy number >=6.0 Enid Cutter MD Pathologist, Electronic Signature ( Signed 04/30/2016)   PROCEDURES  ECHO 11/21/16  Impressions: - Normal LV size and systolic function, EF 00-17%. Normal diastolic   function. Global  longitudinal strain less negative than prior but   images not as clean. Normal RV size and systolic function.   RADIOGRAPHIC STUDIES: I have personally reviewed the radiological images as listed and agreed with the findings in the report.   MRI Breast Bilateral 09/17/16 IMPRESSION: Smaller left breast mass, now measuring 2.5 cm. No new or suspicious enhancement in either breast. RECOMMENDATION: Treatment plan.  MRI Abdomen W WO Contrast 08/16/16 IMPRESSION: Tiny sub-cm hepatic cysts. No evidence of metastatic disease or other acute findings. Tiny benign left adrenal adenoma and right adrenal myelolipoma.  Mammogram 08/09/16 IMPRESSION:  The 3.1 cm x 2 cm x 1.5 cm irregular equal density mass in the left breast is consistent with the known carcinoma showing mammographic evidence of preoperative chemotherapy response.   [place] of left breast 08/09/16 IMPRESSION:  The 2.7 cm lobulated  mass in the left breast is a known biopsy positive for malignancy. Surgical and oncology consult in progress.   See onc history CT ABD/Pelvi 5/10/18s W CONTRAST 06/07/16 IMPRESSION: 1. Wall thickening of the descending and sigmoid colon consistent with an infectious or inflammatory colitis. 2. There is also a short segment of narrowing and possible wall thickening of the superior ascending colon which could reflect a constricting mass or, more likely, be an area of persistent colonic spasm. This could be further assessed with either colonoscopy or a barium enema after the current symptoms of colitis have resolved. 3. Small, subcentimeter, low-density liver lesions which may all be benign. However, 3 these are not evident on prior CT. Liver metastatic disease possible. These could be further assessed with liver MRI with and without contrast. 4. Adrenal lesions which are stable, on the right and myelolipoma and on the left most likely an adenoma.  CT Angio Chest PE W and/or wo Contrast  05/13/16 IMPRESSION: No evidence of pulmonary emboli.  Echo 05/09/16 Study Conclusions -LF EF: 55-60% - Left ventricle: The cavity size was normal. There was mild focal   basal hypertrophy of the septum. Indeterminant diastolic   function. Systolic function was normal. The estimated ejection   fraction was in the range of 55% to 60%. Wall motion was normal;   there were no regional wall motion abnormalities. GLS abnormal at   -13.3%, poor images however.  ASSESSMENT & PLAN: 57 y.o. post-menopausal Caucasian female with a self palpated left breast mass.  1. Breast cancer of upper-outer quadrant of left breast, invasive ductal carcinoma,  stage IIA (cT2N0M0) grade 3, ER-, PR-, HER2 amplified, ypT2N1a -We previously reviewed the patient's imaging and pathology. -We previously reviewed her staging and biology of her breast cancer  -We previously discussed that surgical resection is the definitive treatment for breast cancer, she was seen by breast surgeon Dr. Lucia Gaskins today, lumpectomy versus mastectomy were discussed with patient. -We previously discussed HER2 positive breast cancers total approximately 15% of breast cancers and happens to be more aggressive than HER2 negative cancers, especially ER and PR negative disease, she has high likelihood of cancer recurrence after complete surgical resection.  -I recommended neoadjuvant chemotherapy TCHP (docetaxel, carboplatin, Herceptin and perjeta) every 3 weeks for 6 cycles, followed by maintenance Herceptin and perjeta to complete 1 year therapy -Her baseline echo was normal, she was seen by cardiologist Dr. Benjamine Mola -She unfortunately developed severe diarrhea and cytopenia after chemotherapy, and was hospitalized for several times.  -Chemotherapy was subsequently postponed and dose reduced, and stopped after 4 months treatment. -She underwent left breast lumpectomy on 10/09/16 and the pathology results show significant residual disease, RCB-III -Due  to her residual cancer and positive lymph node I also recommend adjuvant chemo Xeloda for 6 months.  She started Xeloda with concurrent radiation, tolerating well so far.  We will continue -Due to her significant residual disease after neoadjuvant chemotherapy, I recommend her to continue Herceptin and perjeta maintenance therapy, to complete a total of one year therapy.  -Echo 11/21/16 LVEF 26-83%, normal systolic and diastolic function -She started adjuvant chemoradiation with Xeloda on 11/22/16 and completed on 01/10/17. She has experienced mild diarrhea with Xeloda, managed with imodium and lomotil. She has mild-moderate skin toxicity. OTC hydrocortisone topically to hands PRN to reduce inflammation was recommended.  -She is healing well from radiation -She will continue Xeloda at full dose 4 tabs BID for 2 weeks on and 1 week off, starting 01/18/17 to continue for about  4 months.  -continue maintenance Herceptin and perjeta every 3 weeks until April 2019  -Due to severe diarrhea, I will decrease Xeloda dose from 2000 mg twice daily to 2058m in am and 15010min pm. She will start on 02/11/17.  -I recommended soaking her hands in warm water and vinegar to protect her skin from infection  2. Genetics -Given her strong family history of breast cancer, we recommend her to see genetic counseling to ruled out inheritable breast cancer syndrome. She agreed. -Genetic counseling scheduled but she has not been seen, I referred her again  -She has not children. -She declined genetic testing at this time.  -She knows to continue cancer screenings.   3. Type 2 Diabetes mellitus, HTN -Managed by her PCP. -The patient has peripheral neuropathy in her feet from her diabetes. The patient is already on Neurontin with 200 mg at night. We previously discussed that chemotherapy may make her neuropathy worse. -Steroids will be given to reduce chemo side effects and I will reduce dexa to 68m3maily to not affect her  blood sugar much. -We'll monitor her blood glucose and blood pressure closely during her chemotherapy treatment. -DM and HTN, controlled.   4. Ulcerative colitis -We previously discussed that chemotherapy would cause diarrhea and the patient's colitis may exacerbate during chemo  -She is not taking steroids for this. She is on mesalamine, will follow up with Dr. StaFuller PlanShe had severe diarrhea after first cycle chemotherapy, was treated for ulcerative colitis flare -I previously advised the patient to keep herself adequately hydrated and to take Imodium PRN. - I again previously advised the patient to take Lomotil and Imodium frequently as needed. -Since change in chemo her diarrhea has resolved.    5. Arthritis  -s/p b/l hip replacement, last surgery in February 2018.  -Tylenol every 6-8 hours as needed was recommended.  -I encouraged her to f/u with PCP for arthritic pain management   6. Peripheral Neuropathy, secondary to chemotherapy -primarily typing and her neuropathy is worse following bouts of typing. I encouraged her to use her left hand more often and begin wearing cotton gloves.  -Encouraged taking B12 multivitamin to aid with this as well.  -She is currently on Gabapentin for her baseline neuropathy associated w/ her DM, she currently takes this TID.  -She also began vitamin B12 in combination with her Gabapentin. She also reports that she has been taking all three tablets of Gabapentin at night to aid with sleep. This has been helping her with sleep, but her neuropathy has still persisted throughout the day. -She has worsening neuropathy with mild to moderate decreased vibratory sense in hands/feet that is worse at night -she takes 200 mg Neurontin at night, may increase to 300-400 mg at bedtime   7. Hypokalemia  secondary to chemotherapy  -10/26/16 labs showed a 3.0 potassium, she increased potassium to BID for the next 5 days then back to once daily   -She now takes  potassium BID, K 3.2 on 12/14/16 -Her potassium remains at 3.3 on 01/11/17. She will continue potassium BID. I also suggest she increase potassium in her diet.    PLAN Will discontinue 2000 mg Xeloda twice daily and go back to Xeloda 2000m43m am and 1500mg55mpm, for 2 weeks on, one-week off, she will start cycle 2 on January 14  She will proceed Herceptin and pejeta infusion today Lab, flush, f/u and herceptin and perjeta in 3 and 6 weeks    No orders  of the defined types were placed in this encounter.  All questions were answered. The patient knows to call the clinic with any problems, questions or concerns.  I spent 20 minutes counseling the patient face to face. The total time spent in the appointment was 25 minutes and more than 50% was on counseling.  This document serves as a record of services personally performed by Truitt Merle, MD. It was created on her behalf by Theresia Bough, a trained medical scribe. The creation of this record is based on the scribe's personal observations and the provider's statements to them.   I have reviewed the above documentation for accuracy and completeness, and I agree with the above.    Truitt Merle, MD 02/08/2017

## 2017-02-08 ENCOUNTER — Encounter: Payer: Self-pay | Admitting: *Deleted

## 2017-02-08 ENCOUNTER — Telehealth: Payer: Self-pay | Admitting: Hematology

## 2017-02-08 ENCOUNTER — Inpatient Hospital Stay: Payer: 59

## 2017-02-08 ENCOUNTER — Telehealth: Payer: Self-pay | Admitting: Pharmacy Technician

## 2017-02-08 ENCOUNTER — Inpatient Hospital Stay: Payer: 59 | Attending: Hematology | Admitting: Hematology

## 2017-02-08 ENCOUNTER — Encounter: Payer: Self-pay | Admitting: Hematology

## 2017-02-08 VITALS — BP 107/71 | HR 71 | Temp 99.0°F | Resp 18 | Ht 64.5 in | Wt 214.9 lb

## 2017-02-08 DIAGNOSIS — C50412 Malignant neoplasm of upper-outer quadrant of left female breast: Secondary | ICD-10-CM | POA: Insufficient documentation

## 2017-02-08 DIAGNOSIS — Z171 Estrogen receptor negative status [ER-]: Secondary | ICD-10-CM | POA: Diagnosis not present

## 2017-02-08 DIAGNOSIS — G620 Drug-induced polyneuropathy: Secondary | ICD-10-CM | POA: Diagnosis not present

## 2017-02-08 DIAGNOSIS — T451X5D Adverse effect of antineoplastic and immunosuppressive drugs, subsequent encounter: Secondary | ICD-10-CM | POA: Insufficient documentation

## 2017-02-08 DIAGNOSIS — C773 Secondary and unspecified malignant neoplasm of axilla and upper limb lymph nodes: Secondary | ICD-10-CM | POA: Insufficient documentation

## 2017-02-08 DIAGNOSIS — M1990 Unspecified osteoarthritis, unspecified site: Secondary | ICD-10-CM | POA: Diagnosis not present

## 2017-02-08 DIAGNOSIS — I10 Essential (primary) hypertension: Secondary | ICD-10-CM | POA: Diagnosis not present

## 2017-02-08 DIAGNOSIS — E1140 Type 2 diabetes mellitus with diabetic neuropathy, unspecified: Secondary | ICD-10-CM | POA: Diagnosis not present

## 2017-02-08 DIAGNOSIS — E876 Hypokalemia: Secondary | ICD-10-CM | POA: Insufficient documentation

## 2017-02-08 DIAGNOSIS — E119 Type 2 diabetes mellitus without complications: Secondary | ICD-10-CM

## 2017-02-08 DIAGNOSIS — K5130 Ulcerative (chronic) rectosigmoiditis without complications: Secondary | ICD-10-CM

## 2017-02-08 DIAGNOSIS — K5190 Ulcerative colitis, unspecified, without complications: Secondary | ICD-10-CM | POA: Diagnosis not present

## 2017-02-08 DIAGNOSIS — Z95828 Presence of other vascular implants and grafts: Secondary | ICD-10-CM

## 2017-02-08 DIAGNOSIS — Z5112 Encounter for antineoplastic immunotherapy: Secondary | ICD-10-CM | POA: Insufficient documentation

## 2017-02-08 LAB — COMPREHENSIVE METABOLIC PANEL
ALBUMIN: 3.3 g/dL — AB (ref 3.5–5.0)
ALT: 13 U/L (ref 0–55)
AST: 18 U/L (ref 5–34)
Alkaline Phosphatase: 96 U/L (ref 40–150)
Anion gap: 10 (ref 3–11)
BUN: 13 mg/dL (ref 7–26)
CHLORIDE: 101 mmol/L (ref 98–109)
CO2: 29 mmol/L (ref 22–29)
Calcium: 9.2 mg/dL (ref 8.4–10.4)
Creatinine, Ser: 0.88 mg/dL (ref 0.60–1.10)
GFR calc Af Amer: >60 mL/min (ref >60)
Glucose, Bld: 97 mg/dL (ref 70–140)
POTASSIUM: 3.3 mmol/L (ref 3.3–4.7)
Sodium: 140 mmol/L (ref 136–145)
Total Bilirubin: 0.5 mg/dL (ref 0.2–1.2)
Total Protein: 6.6 g/dL (ref 6.4–8.3)

## 2017-02-08 LAB — CBC WITH DIFFERENTIAL/PLATELET
BASOS ABS: 0 10*3/uL (ref 0.0–0.1)
BASOS PCT: 1 %
EOS PCT: 8 %
Eosinophils Absolute: 0.5 10*3/uL (ref 0.0–0.5)
HCT: 37.6 % (ref 34.8–46.6)
Hemoglobin: 12.3 g/dL (ref 11.6–15.9)
Lymphocytes Relative: 24 %
Lymphs Abs: 1.4 10*3/uL (ref 0.9–3.3)
MCH: 30.9 pg (ref 25.1–34.0)
MCHC: 32.8 g/dL (ref 31.5–36.0)
MCV: 94.2 fL (ref 79.5–101.0)
MONO ABS: 0.6 10*3/uL (ref 0.1–0.9)
MONOS PCT: 11 %
Neutro Abs: 3.2 10*3/uL (ref 1.5–6.5)
Neutrophils Relative %: 56 %
PLATELETS: 219 10*3/uL (ref 145–400)
RBC: 3.99 MIL/uL (ref 3.70–5.45)
RDW: 21.5 % — AB (ref 11.2–16.1)
WBC: 5.6 10*3/uL (ref 3.9–10.3)

## 2017-02-08 MED ORDER — TRASTUZUMAB CHEMO 150 MG IV SOLR
600.0000 mg | Freq: Once | INTRAVENOUS | Status: AC
  Administered 2017-02-08: 600 mg via INTRAVENOUS
  Filled 2017-02-08: qty 28.57

## 2017-02-08 MED ORDER — HEPARIN SOD (PORK) LOCK FLUSH 100 UNIT/ML IV SOLN
500.0000 [IU] | Freq: Once | INTRAVENOUS | Status: AC | PRN
  Administered 2017-02-08: 500 [IU]
  Filled 2017-02-08: qty 5

## 2017-02-08 MED ORDER — SODIUM CHLORIDE 0.9 % IV SOLN
Freq: Once | INTRAVENOUS | Status: AC
  Administered 2017-02-08: 12:00:00 via INTRAVENOUS

## 2017-02-08 MED ORDER — ACETAMINOPHEN 325 MG PO TABS
650.0000 mg | ORAL_TABLET | Freq: Once | ORAL | Status: AC
  Administered 2017-02-08: 650 mg via ORAL

## 2017-02-08 MED ORDER — SODIUM CHLORIDE 0.9 % IV SOLN
420.0000 mg | Freq: Once | INTRAVENOUS | Status: AC
  Administered 2017-02-08: 420 mg via INTRAVENOUS
  Filled 2017-02-08: qty 14

## 2017-02-08 MED ORDER — SODIUM CHLORIDE 0.9% FLUSH
10.0000 mL | Freq: Once | INTRAVENOUS | Status: AC
  Administered 2017-02-08: 10 mL
  Filled 2017-02-08: qty 10

## 2017-02-08 MED ORDER — SODIUM CHLORIDE 0.9% FLUSH
10.0000 mL | INTRAVENOUS | Status: DC | PRN
  Administered 2017-02-08: 10 mL
  Filled 2017-02-08: qty 10

## 2017-02-08 MED ORDER — ACETAMINOPHEN 325 MG PO TABS
ORAL_TABLET | ORAL | Status: AC
  Filled 2017-02-08: qty 2

## 2017-02-08 NOTE — Patient Instructions (Signed)
Implanted Port Home Guide An implanted port is a type of central line that is placed under the skin. Central lines are used to provide IV access when treatment or nutrition needs to be given through a person's veins. Implanted ports are used for long-term IV access. An implanted port may be placed because:  You need IV medicine that would be irritating to the small veins in your hands or arms.  You need long-term IV medicines, such as antibiotics.  You need IV nutrition for a long period.  You need frequent blood draws for lab tests.  You need dialysis.  Implanted ports are usually placed in the chest area, but they can also be placed in the upper arm, the abdomen, or the leg. An implanted port has two main parts:  Reservoir. The reservoir is round and will appear as a small, raised area under your skin. The reservoir is the part where a needle is inserted to give medicines or draw blood.  Catheter. The catheter is a thin, flexible tube that extends from the reservoir. The catheter is placed into a large vein. Medicine that is inserted into the reservoir goes into the catheter and then into the vein.  How will I care for my incision site? Do not get the incision site wet. Bathe or shower as directed by your health care provider. How is my port accessed? Special steps must be taken to access the port:  Before the port is accessed, a numbing cream can be placed on the skin. This helps numb the skin over the port site.  Your health care provider uses a sterile technique to access the port. ? Your health care provider must put on a mask and sterile gloves. ? The skin over your port is cleaned carefully with an antiseptic and allowed to dry. ? The port is gently pinched between sterile gloves, and a needle is inserted into the port.  Only "non-coring" port needles should be used to access the port. Once the port is accessed, a blood return should be checked. This helps ensure that the port  is in the vein and is not clogged.  If your port needs to remain accessed for a constant infusion, a clear (transparent) bandage will be placed over the needle site. The bandage and needle will need to be changed every week, or as directed by your health care provider.  Keep the bandage covering the needle clean and dry. Do not get it wet. Follow your health care provider's instructions on how to take a shower or bath while the port is accessed.  If your port does not need to stay accessed, no bandage is needed over the port.  What is flushing? Flushing helps keep the port from getting clogged. Follow your health care provider's instructions on how and when to flush the port. Ports are usually flushed with saline solution or a medicine called heparin. The need for flushing will depend on how the port is used.  If the port is used for intermittent medicines or blood draws, the port will need to be flushed: ? After medicines have been given. ? After blood has been drawn. ? As part of routine maintenance.  If a constant infusion is running, the port may not need to be flushed.  How long will my port stay implanted? The port can stay in for as long as your health care provider thinks it is needed. When it is time for the port to come out, surgery will be   done to remove it. The procedure is similar to the one performed when the port was put in. When should I seek immediate medical care? When you have an implanted port, you should seek immediate medical care if:  You notice a bad smell coming from the incision site.  You have swelling, redness, or drainage at the incision site.  You have more swelling or pain at the port site or the surrounding area.  You have a fever that is not controlled with medicine.  This information is not intended to replace advice given to you by your health care provider. Make sure you discuss any questions you have with your health care provider. Document  Released: 01/15/2005 Document Revised: 06/23/2015 Document Reviewed: 09/22/2012 Elsevier Interactive Patient Education  2017 [employer]  

## 2017-02-08 NOTE — Progress Notes (Signed)
Pt remained in clinic for 30 minute observation period post perjeta and tolerated without difficulty. Pt slept in chair during observation period. Pt discharged without difficulty. Respirations equal and unlabored. No signs of distress noted.

## 2017-02-08 NOTE — Telephone Encounter (Signed)
Gave avs and calendar for february °

## 2017-02-08 NOTE — Telephone Encounter (Signed)
Oral Oncology Patient Advocate Encounter  Received notification from [facility] that prior authorization for capecitabine requires renewal  PA request submitted on CoverMyMeds and approved.   Case #: [id number] Approval dates: 02/08/2017 through 02/08/2018  I have alerted [place] of the approval.   Oral Oncology Clinic will continue to follow.  Fabio Asa. Melynda Keller, [place] Patient [place] [phone] 02/08/2017 1:33 PM

## 2017-02-08 NOTE — Patient Instructions (Signed)
[facility] Discharge Instructions for Patients Receiving Chemotherapy  Today you received the following chemotherapy agents :  Herceptin, Perjeta.  To help prevent nausea and vomiting after your treatment, we encourage you to take your nausea medication as prescribed.   If you develop nausea and vomiting that is not controlled by your nausea medication, call the clinic.   BELOW ARE SYMPTOMS THAT SHOULD BE REPORTED IMMEDIATELY:  *FEVER GREATER THAN 100.5 F  *CHILLS WITH OR WITHOUT FEVER  NAUSEA AND VOMITING THAT IS NOT CONTROLLED WITH YOUR NAUSEA MEDICATION  *UNUSUAL SHORTNESS OF BREATH  *UNUSUAL BRUISING OR BLEEDING  TENDERNESS IN MOUTH AND THROAT WITH OR WITHOUT PRESENCE OF ULCERS  *URINARY PROBLEMS  *BOWEL PROBLEMS  UNUSUAL RASH Items with * indicate a potential emergency and should be followed up as soon as possible.  Feel free to call the clinic should you have any questions or concerns. The clinic phone number is [phone].  Please show the CHEMO ALERT CARD at check-in to the Emergency Department and triage nurse.   

## 2017-02-14 ENCOUNTER — Ambulatory Visit: Payer: 59 | Admitting: Radiation Oncology

## 2017-02-20 ENCOUNTER — Encounter: Payer: Self-pay | Admitting: Oncology

## 2017-02-21 ENCOUNTER — Ambulatory Visit
Admission: RE | Admit: 2017-02-21 | Discharge: 2017-02-21 | Disposition: A | Discharge status: Home / Self Care | Payer: 59 | Source: Ambulatory Visit | Attending: Radiation Oncology | Admitting: Radiation Oncology

## 2017-02-21 ENCOUNTER — Encounter: Payer: Self-pay | Admitting: Radiation Oncology

## 2017-02-21 ENCOUNTER — Other Ambulatory Visit: Payer: Self-pay

## 2017-02-21 DIAGNOSIS — Z79899 Other long term (current) drug therapy: Secondary | ICD-10-CM | POA: Diagnosis not present

## 2017-02-21 DIAGNOSIS — D0512 Intraductal carcinoma in situ of left breast: Secondary | ICD-10-CM | POA: Diagnosis not present

## 2017-02-21 DIAGNOSIS — Z888 Allergy status to other drugs, medicaments and biological substances status: Secondary | ICD-10-CM | POA: Diagnosis not present

## 2017-02-21 DIAGNOSIS — Z880 Allergy status to penicillin: Secondary | ICD-10-CM | POA: Insufficient documentation

## 2017-02-21 DIAGNOSIS — Z171 Estrogen receptor negative status [ER-]: Secondary | ICD-10-CM

## 2017-02-21 DIAGNOSIS — Z7984 Long term (current) use of oral hypoglycemic drugs: Secondary | ICD-10-CM | POA: Diagnosis not present

## 2017-02-21 DIAGNOSIS — C50412 Malignant neoplasm of upper-outer quadrant of left female breast: Secondary | ICD-10-CM

## 2017-02-21 NOTE — Progress Notes (Signed)
Radiation Oncology         [phone] ________________________________  Name: Brandi Dickson MRN: [id number]  Date: 02/21/2017  DOB: 09/29/1960  Follow-Up Visit Note  CC: Orpah Melter, MD  Truitt Merle, MD  No diagnosis found.  Diagnosis: 57 y.o. female with stage IIA,ypT2 ypN1aER+,PR-, HER2+grade 3 invasive ductal carcinoma with DCIS of the left breast   Interval Since Last Radiation:  1 month 11/22/16-01/10/17: 1. The patient received 50.4Gy in 7factions to the left breast.  2. The axillary region received 45 Gy in 25 fractions.  3. The patient then proceeded with a 10 Gy boost to the lumpectomy cavity in 5 fractions for a cumulative dose to the lumpectomy cavity of 60.4 Gy.  Narrative:  The patient returns today for routine follow-up. She reports loose stool from Xeloda that she manages with Imodium. She denies pain or fatigue. She reports occasional tenderness to the left neck radiating to the left subclavian. Overall, she is pleased with her healing process.                 ALLERGIES:  is allergic to lisinopril; augmentin [amoxicillin-pot clavulanate]; benadryl [diphenhydramine]; and losartan potassium.  Meds: Current Outpatient Medications  Medication Sig Dispense Refill  . albuterol (PROVENTIL HFA;VENTOLIN HFA) 108 (90 Base) MCG/ACT inhaler Inhale 1-2 puffs into the lungs every 6 (six) hours as needed for wheezing or shortness of breath.    . capecitabine (XELODA) 500 MG tablet Take  4 tabs = 2000 mg  BID  For  2  Weeks  On  ,  1  Week  Off. 112 tablet 1  . diazepam (VALIUM) 5 MG tablet Take 1 tablet (5 mg total) by mouth as directed. Take 5 mg by mouth 30 minutes prior to MRI procedure.   May repeat x 1. DO NOT Drive after taking Valium as it may cause drowsiness. 2 tablet 0  . dicyclomine (BENTYL) 20 MG tablet Take 1 tablet (20 mg total) by mouth 4 (four) times daily -  before meals and at bedtime. 30 tablet 0  . diphenoxylate-atropine (LOMOTIL) 2.5-0.025 MG  tablet Take one to two tablets every six hours as needed for diarrhea 120 tablet 1  . ferrous sulfate 325 (65 FE) MG tablet Take 325 mg by mouth 2 (two) times daily with a meal.    . gabapentin (NEURONTIN) 100 MG capsule Take 200 mg by mouth at bedtime.     . Homeopathic Products (ZICAM ALLERGY RELIEF NA) Take 1 tablet by mouth daily.    . hydrocortisone (ANUSOL-HC) 25 MG suppository Place 1 suppository (25 mg total) rectally 2 (two) times daily. (Patient taking differently: Place 25 mg rectally as needed. ) 12 suppository 0  . lidocaine-prilocaine (EMLA) cream Apply 1 application to skin 1.5 to 2 hrs before use.  Cover to secure cream with plastic wrap. 30 g PRN  . loperamide (IMODIUM) 1 MG/5ML solution Take 3 mg by mouth as needed for diarrhea or loose stools.    . magnesium oxide (MAG-OX) 400 MG tablet Take 400 mg by mouth daily.     . Mesalamine 800 MG TBEC Take 1,600 mg by mouth 2 (two) times daily.    . metFORMIN (GLUCOPHAGE-XR) 500 MG 24 hr tablet Take 1,000 mg by mouth at bedtime.    . metoprolol (LOPRESSOR) 50 MG tablet Take 50 mg by mouth 2 (two) times daily. Takes 1 in the am and 1 at HS    . niacin 250 MG tablet Take 250 mg  by mouth at bedtime.     . ondansetron (ZOFRAN) 8 MG tablet Take 1 tablet (8 mg total) by mouth 2 (two) times daily as needed for refractory nausea / vomiting. Start on day 3 after chemo. 30 tablet 1  . potassium chloride SA (K-DUR,KLOR-CON) 20 MEQ tablet Take 1 tablet (20 mEq total) by mouth 2 (two) times daily. 60 tablet 2  . triamterene-hydrochlorothiazide (MAXZIDE) 75-50 MG per tablet Take 0.5 tablets by mouth daily.     [name] Kitchen HYDROcodone-acetaminophen (NORCO/VICODIN) 5-325 MG tablet Take 1-2 tablets by mouth every 6 (six) hours as needed for moderate pain. (Patient not taking: Reported on 02/21/2017) 20 tablet 0  . LORazepam (ATIVAN) 0.5 MG tablet Take 1 tablet (0.5 mg total) by mouth once as needed for anxiety. (Patient not taking: Reported on 01/11/2017) 2 tablet 0    . prochlorperazine (COMPAZINE) 10 MG tablet Take 1 tablet (10 mg total) by mouth every 6 (six) hours as needed (Nausea or vomiting). (Patient not taking: Reported on 12/26/2016) 30 tablet 1  . traMADol (ULTRAM) 50 MG tablet Take 1-2 tablets (50-100 mg total) by mouth every 6 (six) hours as needed. (Patient not taking: Reported on 12/26/2016) 40 tablet 0   No current facility-administered medications for this encounter.    Facility-Administered Medications Ordered in Other Encounters  Medication Dose Route Frequency Provider Last Rate Last Dose  . heparin lock flush 100 unit/mL  500 Units Intracatheter Once PRN Truitt Merle, MD      . sodium chloride flush (NS) 0.9 % injection 10 mL  10 mL Intracatheter PRN Truitt Merle, MD      . sodium chloride flush (NS) 0.9 % injection 10 mL  10 mL Intracatheter PRN Truitt Merle, MD   10 mL at 08/24/16 1725  . sodium chloride flush (NS) 0.9 % injection 10 mL  10 mL Intracatheter PRN Truitt Merle, MD   10 mL at 11/16/16 1628    Physical Findings: The patient is in no acute distress. Patient is alert and oriented.  height is 5' 4.5" (1.638 m) and weight is 218 lb (98.9 kg). Her oral temperature is 98.2 F (36.8 C). Her blood pressure is 115/81 and her pulse is 81. Her oxygen saturation is 100%. .  No significant changes. Lungs are clear to auscultation bilaterally. Heart has regular rate and rhythm. No palpable cervical, supraclavicular, or axillary adenopathy. Abdomen soft, non-tender, normal bowel sounds. Right breast no palpable mass, nipple discharge or bleeding. Left breast skin has healed well. She continues to have hyperpigmentation changes in left breast and supraclavicular region. No palpable mass, nipple discharge or bleeding.   Lab Findings: Lab Results  Component Value Date   WBC 5.6 02/08/2017   HGB 12.3 02/08/2017   HCT 37.6 02/08/2017   MCV 94.2 02/08/2017   PLT 219 02/08/2017    Radiographic Findings: No results found.  Impression:  The patient  is recovering from the effects of radiation. No evidence of recurrence on clinical exam today.  Plan: Follow-up in radiation oncology prn. She will remain under close follow-up with medical oncology and remain on Xeloda.  ____________________________________     This document serves as a record of services personally performed by Gery Pray, MD. It was created on his behalf by Bethann Humble, a trained medical scribe. The creation of this record is based on the scribe's personal observations and the provider's statements to them. This document has been checked and approved by the attending provider.

## 2017-02-21 NOTE — Progress Notes (Signed)
Brandi Dickson is here for follow up after treatment to her left breast.  She denies having pain but reports having stomach issues due to taking Xeloda.  She denies having fatigue.  The skin on her left subclavian and breast has hyperpigmentation.  BP 115/81 (BP Location: Right Arm, Patient Position: Sitting)   Pulse 81   Temp 98.2 F (36.8 C) (Oral)   Ht 5' 4.5" (1.638 m)   Wt 218 lb (98.9 kg)   SpO2 100%   BMI 36.84 kg/m    Wt Readings from Last 3 Encounters:  02/21/17 218 lb (98.9 kg)  02/08/17 214 lb 14.4 oz (97.5 kg)  01/18/17 218 lb 1.9 oz (98.9 kg)

## 2017-02-27 NOTE — Progress Notes (Signed)
error 

## 2017-02-28 ENCOUNTER — Other Ambulatory Visit: Payer: Self-pay | Admitting: Hematology

## 2017-03-01 ENCOUNTER — Inpatient Hospital Stay: Payer: 59

## 2017-03-01 ENCOUNTER — Telehealth (HOSPITAL_COMMUNITY): Payer: Self-pay | Admitting: Vascular Surgery

## 2017-03-01 ENCOUNTER — Inpatient Hospital Stay: Payer: 59 | Attending: Hematology | Admitting: Adult Health

## 2017-03-01 ENCOUNTER — Encounter: Payer: Self-pay | Admitting: Adult Health

## 2017-03-01 VITALS — BP 100/57 | HR 64 | Temp 97.7°F | Resp 18

## 2017-03-01 VITALS — BP 121/62 | HR 73 | Temp 98.7°F | Resp 18 | Ht 64.5 in | Wt 216.9 lb

## 2017-03-01 DIAGNOSIS — C50412 Malignant neoplasm of upper-outer quadrant of left female breast: Secondary | ICD-10-CM | POA: Diagnosis not present

## 2017-03-01 DIAGNOSIS — Z171 Estrogen receptor negative status [ER-]: Secondary | ICD-10-CM | POA: Insufficient documentation

## 2017-03-01 DIAGNOSIS — Z5112 Encounter for antineoplastic immunotherapy: Secondary | ICD-10-CM | POA: Diagnosis present

## 2017-03-01 LAB — COMPREHENSIVE METABOLIC PANEL
ALBUMIN: 3.2 g/dL — AB (ref 3.5–5.0)
ALK PHOS: 89 U/L (ref 40–150)
ALT: 11 U/L (ref 0–55)
ANION GAP: 9 (ref 3–11)
AST: 15 U/L (ref 5–34)
BUN: 12 mg/dL (ref 7–26)
CHLORIDE: 102 mmol/L (ref 98–109)
CO2: 28 mmol/L (ref 22–29)
Calcium: 9.2 mg/dL (ref 8.4–10.4)
Creatinine, Ser: 0.78 mg/dL (ref 0.60–1.10)
GFR calc non Af Amer: >60 mL/min (ref >60)
GLUCOSE: 96 mg/dL (ref 70–140)
POTASSIUM: 3.7 mmol/L (ref 3.5–5.1)
Sodium: 139 mmol/L (ref 136–145)
Total Bilirubin: 0.4 mg/dL (ref 0.2–1.2)
Total Protein: 6.4 g/dL (ref 6.4–8.3)

## 2017-03-01 LAB — CBC WITH DIFFERENTIAL/PLATELET
BASOS PCT: 0 %
Basophils Absolute: 0 10*3/uL (ref 0.0–0.1)
EOS ABS: 0.3 10*3/uL (ref 0.0–0.5)
EOS PCT: 5 %
HCT: 35.5 % (ref 34.8–46.6)
Hemoglobin: 11.7 g/dL (ref 11.6–15.9)
LYMPHS ABS: 1.4 10*3/uL (ref 0.9–3.3)
Lymphocytes Relative: 21 %
MCH: 32.1 pg (ref 25.1–34.0)
MCHC: 33.1 g/dL (ref 31.5–36.0)
MCV: 97 fL (ref 79.5–101.0)
MONOS PCT: 9 %
Monocytes Absolute: 0.6 10*3/uL (ref 0.1–0.9)
Neutro Abs: 4.5 10*3/uL (ref 1.5–6.5)
Neutrophils Relative %: 65 %
PLATELETS: 215 10*3/uL (ref 145–400)
RBC: 3.65 MIL/uL — ABNORMAL LOW (ref 3.70–5.45)
RDW: 20.9 % — ABNORMAL HIGH (ref 11.2–14.5)
WBC: 6.9 10*3/uL (ref 3.9–10.3)

## 2017-03-01 MED ORDER — ACETAMINOPHEN 325 MG PO TABS
650.0000 mg | ORAL_TABLET | Freq: Once | ORAL | Status: AC
  Administered 2017-03-01: 650 mg via ORAL

## 2017-03-01 MED ORDER — TRASTUZUMAB CHEMO 150 MG IV SOLR
600.0000 mg | Freq: Once | INTRAVENOUS | Status: AC
  Administered 2017-03-01: 600 mg via INTRAVENOUS
  Filled 2017-03-01: qty 28.57

## 2017-03-01 MED ORDER — HEPARIN SOD (PORK) LOCK FLUSH 100 UNIT/ML IV SOLN
500.0000 [IU] | Freq: Once | INTRAVENOUS | Status: AC | PRN
  Administered 2017-03-01: 500 [IU]
  Filled 2017-03-01: qty 5

## 2017-03-01 MED ORDER — ACETAMINOPHEN 325 MG PO TABS
ORAL_TABLET | ORAL | Status: AC
  Filled 2017-03-01: qty 2

## 2017-03-01 MED ORDER — SODIUM CHLORIDE 0.9 % IJ SOLN
10.0000 mL | Freq: Once | INTRAMUSCULAR | Status: DC
  Filled 2017-03-01: qty 10

## 2017-03-01 MED ORDER — SODIUM CHLORIDE 0.9 % IV SOLN
Freq: Once | INTRAVENOUS | Status: AC
  Administered 2017-03-01: 12:00:00 via INTRAVENOUS

## 2017-03-01 MED ORDER — SODIUM CHLORIDE 0.9 % IV SOLN
420.0000 mg | Freq: Once | INTRAVENOUS | Status: AC
  Administered 2017-03-01: 420 mg via INTRAVENOUS
  Filled 2017-03-01: qty 14

## 2017-03-01 MED ORDER — SODIUM CHLORIDE 0.9% FLUSH
10.0000 mL | INTRAVENOUS | Status: DC | PRN
  Administered 2017-03-01: 10 mL
  Filled 2017-03-01: qty 10

## 2017-03-01 NOTE — Progress Notes (Addendum)
[facility] Follow up:    Brandi Melter, MD [address] [place] [address]   DIAGNOSIS: Cancer Staging Breast cancer of upper-outer quadrant of left female breast [facility]) Staging form: Breast, AJCC 8th Edition - Clinical stage from 04/25/2016: Stage IIA (cT2, cN0, cM0, G3, ER: Negative, PR: Negative, HER2: Positive) - Signed by Truitt Merle, MD on 05/02/2016 - Pathologic stage from 10/09/2016: No Stage Recommended (ypT2, pN1a, cM0, G3, ER: Negative, PR: Negative, HER2: Positive) - Signed by Truitt Merle, MD on 10/26/2016   SUMMARY OF ONCOLOGIC HISTORY: Oncology History   Cancer Staging Breast cancer of upper-outer quadrant of left female breast ([place]) Staging form: Breast, AJCC 8th Edition - Clinical stage from 04/25/2016: Stage IIA (cT2, cN0, cM0, G3, ER: Negative, PR: Negative, HER2: Positive) - Signed by Truitt Merle, MD on 05/02/2016 - Pathologic stage from 10/09/2016: No Stage Recommended (ypT2, pN1a, cM0, G3, ER: Negative, PR: Negative, HER2: Positive) - Signed by Truitt Merle, MD on 10/26/2016       Breast cancer of upper-outer quadrant of left female breast ([place])   04/24/2016 Mammogram    Category B breasts with a new irregular mass in the UOQ left breast middle depth. Ultrasound revealed a 3.9 cm mass in the 1:00 position. The left axilla was negative.       04/25/2016 Initial Biopsy    Biopsy of the left breast showed grade 3 invasive ductal carcinoma, DCIS, and lymphovascular invasion was present.      04/25/2016 Receptors her2    ER 0% negative, PR 0% negative, HER2 positive, Ki67 30%      05/02/2016 Initial Diagnosis    Breast cancer of upper-outer quadrant of left female breast ([place])      05/07/2016 Imaging    MRI of the bilateral breast 05/07/16 IMPRESSION: Lobulated enhancing mass (4.0 x 2.7 x 3.1 cm) in the upper-outer quadrant of the left breast corresponding with the recently diagnosed invasive mammary carcinoma. Linear enhancement extends 2.6 cm  posterior to the mass worrisome for ductal carcinoma in-situ.      05/09/2016 Echocardiogram    Echo 05/09/16 -LF EF: 55-60%      05/11/2016 - 08/31/2016 Neo-Adjuvant Chemotherapy    Cycle 1 Docetaxel, Carboplatin, Herceptin and pejeta (TCHP), cycle 2 Docetaxel changed to Taxol, carbo dose reduced to AUC 4.5 (from 5) due to severe diarrhea and cytopenia, Perjeta held after cycle 2.   Due to poor toleration, Brandi Dickson was held after cycle 4 and Taxol changed to weekly X4. Herceptin was continued during the above chemo.        05/13/2016 Imaging    CT Angio Chest PE IMPRESSION: No evidence of pulmonary emboli. Left breast mass consistent with the given clinical history. Stable left adrenal lesion likely representing a small adenoma.      05/18/2016 - 05/24/2016 Hospital Admission    Patient presented with nausea, vomiting, and diarrhea; admitted to hospital with Hyponatremia      06/07/2016 - 06/09/2016 Hospital Admission    Patient presents to hospital complaints of rectal bleeding and abdominal cramps when defacating      06/07/2016 Imaging    CT ABDOMEN PELVIS W CONTRAST  IMPRESSION: 1. Wall thickening of the descending and sigmoid colon consistent with an infectious or inflammatory colitis. 2. There is also a short segment of narrowing and possible wall thickening of the superior ascending colon which could reflect a constricting mass or, more likely, be an area of persistent colonic spasm. This could  be further assessed with either colonoscopy or a barium enema after the current symptoms of colitis have resolved. 3. Small, subcentimeter, low-density liver lesions which may all be benign. However, 3 these are not evident on prior CT. Liver metastatic disease possible. These could be further assessed with liver MRI with and without contrast. 4. Adrenal lesions which are stable, on the right and myelolipoma and on the left most likely an adenoma.      06/07/2016 Imaging    CT  A/P IMPRESSION: 1. Wall thickening of the descending and sigmoid colon consistent with an infectious or inflammatory colitis. 2. There is also a short segment of narrowing and possible wall thickening of the superior ascending colon which could reflect a constricting mass or, more likely, be an area of persistent colonic spasm. This could be further assessed with either colonoscopy or a barium enema after the current symptoms of colitis have resolved. 3. Small, subcentimeter, low-density liver lesions which may all be benign. However, 3 these are not evident on prior CT. Liver metastatic disease possible. These could be further assessed with liver MRI with and without contrast. 4. Adrenal lesions which are stable, on the right and myelolipoma and on the left most likely an adenoma.      06/07/2016 - 06/09/2016 Hospital Admission    Diarrhea and rectal Bleeding      07/19/2016 - 07/20/2016 Hospital Admission    Diarrhea and dehydration      08/09/2016 Mammogram    Mammogram 08/09/16 IMPRESSION:  The 3.1 cm x 2 cm x 1.5 cm irregular equal density mass in the left breast is consistent with the known carcinoma showing mammographic evidence of preoperative chemotherapy response.         08/09/2016 Imaging    [place] of left breast 08/09/16 IMPRESSION:  The 2.7 cm lobulated mass in the left breast is a known biopsy positive for malignancy. Surgical and oncology consult in progress.      08/16/2016 Imaging    MRI Abdomen W WO Contrast IMPRESSION: Tiny sub-cm hepatic cysts. No evidence of metastatic disease or other acute findings. Tiny benign left adrenal adenoma and right adrenal myelolipoma.      09/14/2016 -  Chemotherapy    Maintenance Herceptin every 3 weeks. Added Perjeta back on 10/26/16      09/17/2016 Imaging    MRI Breast Bilateral 09/17/16 IMPRESSION: Smaller left breast mass, now measuring 2.5 cm. No new or suspicious enhancement in either  breast.  RECOMMENDATION: Treatment plan.      10/09/2016 Surgery    LEFT BREAST LUMPECTOMY WITH RADIOACTIVE SEED AND L4EFT AXILLARY SENTINEL LYMPH NODE BIOPSY by Dr. Lucia Gaskins on 10/09/16      10/09/2016 Pathology Results    Diagnosis 10/09/16 1. Breast, lumpectomy, Left - INVASIVE DUCTAL CARCINOMA, GRADE 3, SPANNING 2.4 CM. - HIGH GRADE DUCTAL CARCINOMA IN SITU WITH NECROSIS. - RESECTION MARGINS ARE NEGATIVE FOR CARCINOMA. - BIOPSY SITE. - SEE ONCOLOGY TABLE. 2. Breast, excision, Left additional medial margin - FIBROCYSTIC CHANGE. - NO MALIGNANCY IDENTIFIED. 3. Lymph node, sentinel, biopsy, Left Axillary - METASTATIC CARCINOMA IN ONE OF ONE LYMPH NODES (1/1).        11/22/2016 - 01/10/2017 Radiation Therapy    Radiation with Dr. Sondra Come and complete on 01/10/17      11/22/2016 -  Chemotherapy    Xeloda With radiation she will be on low dose 4 tablets in the am and 3 tablets in the pm. Starting 11/22/16 and complete on 01/10/17.   Will continue  Xeloda, 14 days on and 7 days off, 4 tablets BID starting 01/18/17        CURRENT THERAPY:Herceptin/Perjeta every three weeks, Xeloda 14 days on 7 days off  INTERVAL HISTORY: Brandi Dickson 57 y.o. female returns for evaluation of her Her 2 positive breast cancer prior to receiving Herceptin/Perjeta.  She is also taking Xeloda BID.  Sometimes she takes more than a 7 day off period due to diarrhea.  She does not typically experience diarrhea with Herceptin/Perjeta.  She is due for an echocardiogram.  She received a call from Dr. Oleh Genin office today to schedule.     Patient Active Problem List   Diagnosis Date Noted  . Lactic acidosis 07/19/2016  . Antineoplastic chemotherapy induced pancytopenia ([place])   . GIB (gastrointestinal bleeding) 06/07/2016  . Hypertension   . Diabetes mellitus without complication ([place])   . Ulcerative colitis without complications ([place]) 46/50/3546  . Hematochezia   . Generalized abdominal pain   .  Nausea vomiting and diarrhea   . Diarrhea of presumed infectious origin   . Hyponatremia 05/18/2016  . Hypokalemia 05/18/2016  . Hypomagnesemia 05/18/2016  . Dehydration 05/18/2016  . Blood in stool 05/18/2016  . Thrombocytopenia ([place]) 05/18/2016  . GI bleeding 05/18/2016  . Port catheter in place 05/11/2016  . Breast cancer of upper-outer quadrant of left female breast ([place]) 05/02/2016  . S/P left THA, AA 03/06/2016  . Rectal bleeding 07/14/2015  . LLQ abdominal pain 07/14/2015  . Special screening for malignant neoplasms, colon 07/14/2015  . Loss of weight 07/14/2015  . Family history of colon cancer 07/14/2015    is allergic to lisinopril; augmentin [amoxicillin-pot clavulanate]; benadryl [diphenhydramine]; and losartan potassium.  MEDICAL HISTORY: Past Medical History:  Diagnosis Date  . Arthritis   . Asthma    triggered with [name] perfumes and cigarette smoke  . Cancer ([place])   . Colitis   . Diabetes mellitus without complication ([place])   . History of radiation therapy 11/22/16-01/10/17   left breast 50.4 Gy in 28 fractions, axillary region 45 Gy in 25 fractions, lumpectomy cavity boost 10 Gy tin 5 fractions  . Hypertension   . Neuropathy     SURGICAL HISTORY: Past Surgical History:  Procedure Laterality Date  . ABDOMINAL HYSTERECTOMY    . BREAST LUMPECTOMY WITH RADIOACTIVE SEED AND SENTINEL LYMPH NODE BIOPSY Left 10/09/2016   Procedure: LEFT BREAST LUMPECTOMY WITH RADIOACTIVE SEED AND L4EFT AXILLARY SENTINEL LYMPH NODE BIOPSY;  Surgeon: [name], MD;  Location: [facility];  Service: General;  Laterality: Left;  . DILATION AND CURETTAGE OF UTERUS    . KNEE ARTHROSCOPY Left   . PORTACATH PLACEMENT Right 05/08/2016   Procedure: INSERTION PORT-A-CATH WITH [place];  Surgeon: [name], MD;  Location: [facility];  Service: General;  Laterality: Right;  . TONSILLECTOMY    . TOTAL HIP ARTHROPLASTY Right   . TOTAL HIP ARTHROPLASTY Left  03/06/2016   Procedure: LEFT TOTAL HIP ARTHROPLASTY ANTERIOR APPROACH;  Surgeon: [name], MD;  Location: [facility];  Service: Orthopedics;  Laterality: Left;    SOCIAL HISTORY: Social History   Socioeconomic History  . Marital status: Married    Spouse name: Not on file  . Number of children: 0  . Years of education: Not on file  . Highest education level: Not on file  Social Needs  . Financial resource strain: Not on file  . Food insecurity - worry: Not on file  . Food insecurity - inability: Not  on file  . Transportation needs - medical: Not on file  . Transportation needs - non-medical: Not on file  Occupational History  . Not on file  Tobacco Use  . Smoking status: Former Smoker    Packs/day: 1.00    Years: 29.00    Pack years: 29.00    Types: Cigarettes    Last attempt to quit: 10/27/2002    Years since quitting: 14.3  . Smokeless tobacco: Never Used  Substance and Sexual Activity  . Alcohol use: No    Alcohol/week: 0.0 oz  . Drug use: No  . Sexual activity: Yes    Birth control/protection: Surgical  Other Topics Concern  . Not on file  Social History Narrative  . Not on file    FAMILY HISTORY: Family History  Problem Relation Age of Onset  . Colon cancer Father   . Stomach cancer Paternal Uncle   . Stomach cancer Paternal Uncle   . Melanoma Brother   . Thyroid cancer Brother   . Breast cancer Maternal Aunt   . Breast cancer Maternal Aunt   . Breast cancer Cousin     Review of Systems  Constitutional: Negative for appetite change, chills, fatigue, fever and unexpected weight change.  HENT:   Negative for hearing loss, lump/mass and nosebleeds.   Eyes: Negative for eye problems and icterus.  Respiratory: Negative for chest tightness, cough and shortness of breath.   Cardiovascular: Negative for chest pain and leg swelling.  Gastrointestinal: Positive for diarrhea (occasional). Negative for abdominal distention, abdominal pain, constipation, nausea and  vomiting.  Endocrine: Negative for hot flashes.  Musculoskeletal: Negative for arthralgias.  Skin: Negative for itching and rash.  Neurological: Negative for dizziness, extremity weakness, headaches and numbness.  Hematological: Negative for adenopathy.  Psychiatric/Behavioral: Negative for depression. The patient is not nervous/anxious.       PHYSICAL EXAMINATION  ECOG PERFORMANCE STATUS: 1 - Symptomatic but completely ambulatory  Vitals:   03/01/17 1015  BP: 121/62  Pulse: 73  Resp: 18  Temp: 98.7 F (37.1 C)  SpO2: 99%    Physical Exam  Constitutional: She is oriented to person, place, and time and well-developed, well-nourished, and in no distress.  HENT:  Head: Normocephalic and atraumatic.  Mouth/Throat: No oropharyngeal exudate.  Eyes: Pupils are equal, round, and reactive to light. No scleral icterus.  Neck: Neck supple.  Cardiovascular: Normal rate, regular rhythm and normal heart sounds. Exam reveals no gallop and no friction rub.  No murmur heard. Pulmonary/Chest: Effort normal and breath sounds normal. No respiratory distress. She has no wheezes. She has no rales. She exhibits no tenderness.  Abdominal: Soft. Bowel sounds are normal. She exhibits no distension and no mass. There is no tenderness. There is no rebound and no guarding.  Musculoskeletal: She exhibits no edema.  Lymphadenopathy:    She has no cervical adenopathy.  Neurological: She is alert and oriented to person, place, and time.  Skin: Skin is warm and dry. No rash noted. No erythema.  Psychiatric: Mood and affect normal.    LABORATORY DATA:  CBC    Component Value Date/Time   WBC 6.9 03/01/2017 1000   RBC 3.65 (L) 03/01/2017 1000   HGB 11.7 03/01/2017 1000   HGB 12.5 01/18/2017 0853   HCT 35.5 03/01/2017 1000   HCT 38.2 01/18/2017 0853   PLT 215 03/01/2017 1000   PLT 178 01/18/2017 0853   MCV 97.0 03/01/2017 1000   MCV 92.0 01/18/2017 0853   MCH 32.1 03/01/2017  1000   MCHC 33.1  03/01/2017 1000   RDW 20.9 (H) 03/01/2017 1000   RDW 18.1 (H) 01/18/2017 0853   LYMPHSABS 1.4 03/01/2017 1000   LYMPHSABS 1.4 01/18/2017 0853   MONOABS 0.6 03/01/2017 1000   MONOABS 0.6 01/18/2017 0853   EOSABS 0.3 03/01/2017 1000   EOSABS 0.5 01/18/2017 0853   BASOSABS 0.0 03/01/2017 1000   BASOSABS 0.0 01/18/2017 0853    CMP     Component Value Date/Time   NA 139 03/01/2017 1000   NA 138 01/18/2017 0853   K 3.7 03/01/2017 1000   K 3.5 01/18/2017 0853   CL 102 03/01/2017 1000   CO2 28 03/01/2017 1000   CO2 25 01/18/2017 0853   GLUCOSE 96 03/01/2017 1000   GLUCOSE 102 01/18/2017 0853   BUN 12 03/01/2017 1000   BUN 18.6 01/18/2017 0853   CREATININE 0.78 03/01/2017 1000   CREATININE 0.9 01/18/2017 0853   CALCIUM 9.2 03/01/2017 1000   CALCIUM 9.1 01/18/2017 0853   PROT 6.4 03/01/2017 1000   PROT 6.9 01/18/2017 0853   ALBUMIN 3.2 (L) 03/01/2017 1000   ALBUMIN 3.6 01/18/2017 0853   AST 15 03/01/2017 1000   AST 17 01/18/2017 0853   ALT 11 03/01/2017 1000   ALT 15 01/18/2017 0853   ALKPHOS 89 03/01/2017 1000   ALKPHOS 99 01/18/2017 0853   BILITOT 0.4 03/01/2017 1000   BILITOT 0.68 01/18/2017 0853   GFRNONAA >60 03/01/2017 1000   GFRAA >60 03/01/2017 1000       ASSESSMENT and PLAN:   Breast cancer of upper-outer quadrant of left female breast ([place]) Brandi Dickson is a 57 year old woman with h/o stage IIA HER-2 positive breast cancer treated with neoadjuvant chemotherapy/Herceptin/Perjeta, lumpectomy with SNB, adjuvant radiation with Xeloda, and now continuing maintenance with Herceptin/Perjeta and Xeloda 14 days on 7 days off.  Brandi Dickson is tolerating treatment well.  She may sometimes hold the xeloda a day or two extra due to diarrhea. She denies any issues today and her labs are normal today.  I reviewed them with her in detail.  She will proceed with treatment today.  She knows that she needs to have an echo prior to her next appointment with [name], on 03/22/2017.      All questions were answered. The patient knows to call the clinic with any problems, questions or concerns. We can certainly see the patient much sooner if necessary.  A total of (30) minutes of face-to-face time was spent with this patient with greater than 50% of that time in counseling and care-coordination.  This note was electronically signed. Scot Dock, NP 03/01/2017

## 2017-03-01 NOTE — Telephone Encounter (Signed)
Left pt message to make brst f/ w/ eco feb 12 or 13th w/ [facility]

## 2017-03-01 NOTE — Progress Notes (Signed)
Per Charlestine Massed ok to treat with last echocardiogram. She is being scheduled for her next one.

## 2017-03-01 NOTE — Patient Instructions (Signed)
[facility] Discharge Instructions for Patients Receiving Chemotherapy  Today you received the following chemotherapy agents :  Herceptin, Perjeta.  To help prevent nausea and vomiting after your treatment, we encourage you to take your nausea medication as prescribed.   If you develop nausea and vomiting that is not controlled by your nausea medication, call the clinic.   BELOW ARE SYMPTOMS THAT SHOULD BE REPORTED IMMEDIATELY:  *FEVER GREATER THAN 100.5 F  *CHILLS WITH OR WITHOUT FEVER  NAUSEA AND VOMITING THAT IS NOT CONTROLLED WITH YOUR NAUSEA MEDICATION  *UNUSUAL SHORTNESS OF BREATH  *UNUSUAL BRUISING OR BLEEDING  TENDERNESS IN MOUTH AND THROAT WITH OR WITHOUT PRESENCE OF ULCERS  *URINARY PROBLEMS  *BOWEL PROBLEMS  UNUSUAL RASH Items with * indicate a potential emergency and should be followed up as soon as possible.  Feel free to call the clinic should you have any questions or concerns. The clinic phone number is [phone].  Please show the CHEMO ALERT CARD at check-in to the Emergency Department and triage nurse.   

## 2017-03-01 NOTE — Assessment & Plan Note (Addendum)
Brandi Dickson is a 57 year old woman with h/o stage IIA HER-2 positive breast cancer treated with neoadjuvant chemotherapy/Herceptin/Perjeta, lumpectomy with SNB, adjuvant radiation with Xeloda, and now continuing maintenance with Herceptin/Perjeta and Xeloda 14 days on 7 days off.  Brandi Dickson is tolerating treatment well.  She may sometimes hold the xeloda a day or two extra due to diarrhea. She denies any issues today and her labs are normal today.  I reviewed them with her in detail.  She will proceed with treatment today.  She knows that she needs to have an echo prior to her next appointment with [name], on 03/22/2017.

## 2017-03-01 NOTE — Patient Instructions (Signed)

## 2017-03-04 ENCOUNTER — Telehealth (HOSPITAL_COMMUNITY): Payer: Self-pay | Admitting: Vascular Surgery

## 2017-03-04 NOTE — Telephone Encounter (Signed)
Left pt message giving echo appt w/ f/u w/ [facility] asked pt to call back to confirm appt

## 2017-03-05 ENCOUNTER — Telehealth: Payer: Self-pay

## 2017-03-05 NOTE — Telephone Encounter (Signed)
Scheduled patient appointment for 3/15 and 4/5 per provide plan and patient phone request. Per 2/5 voice mail.

## 2017-03-12 ENCOUNTER — Ambulatory Visit (HOSPITAL_BASED_OUTPATIENT_CLINIC_OR_DEPARTMENT_OTHER)
Admission: RE | Admit: 2017-03-12 | Discharge: 2017-03-12 | Disposition: A | Discharge status: Home / Self Care | Payer: 59 | Source: Ambulatory Visit | Attending: Cardiology | Admitting: Cardiology

## 2017-03-12 ENCOUNTER — Ambulatory Visit (HOSPITAL_COMMUNITY)
Admission: RE | Admit: 2017-03-12 | Discharge: 2017-03-12 | Disposition: A | Discharge status: Home / Self Care | Payer: 59 | Source: Ambulatory Visit | Attending: Cardiology | Admitting: Cardiology

## 2017-03-12 VITALS — BP 140/92 | HR 70 | Wt 215.0 lb

## 2017-03-12 DIAGNOSIS — Z171 Estrogen receptor negative status [ER-]: Secondary | ICD-10-CM

## 2017-03-12 DIAGNOSIS — Z87891 Personal history of nicotine dependence: Secondary | ICD-10-CM | POA: Diagnosis not present

## 2017-03-12 DIAGNOSIS — I10 Essential (primary) hypertension: Secondary | ICD-10-CM | POA: Insufficient documentation

## 2017-03-12 DIAGNOSIS — Z7984 Long term (current) use of oral hypoglycemic drugs: Secondary | ICD-10-CM | POA: Diagnosis not present

## 2017-03-12 DIAGNOSIS — Z79899 Other long term (current) drug therapy: Secondary | ICD-10-CM | POA: Insufficient documentation

## 2017-03-12 DIAGNOSIS — C50412 Malignant neoplasm of upper-outer quadrant of left female breast: Secondary | ICD-10-CM | POA: Diagnosis not present

## 2017-03-12 DIAGNOSIS — E119 Type 2 diabetes mellitus without complications: Secondary | ICD-10-CM | POA: Diagnosis not present

## 2017-03-12 DIAGNOSIS — K5190 Ulcerative colitis, unspecified, without complications: Secondary | ICD-10-CM | POA: Diagnosis not present

## 2017-03-12 DIAGNOSIS — Z808 Family history of malignant neoplasm of other organs or systems: Secondary | ICD-10-CM | POA: Diagnosis not present

## 2017-03-12 NOTE — Progress Notes (Signed)
  Echocardiogram 2D Echocardiogram has been performed.  Jennette Dubin 03/12/2017, 12:07 PM

## 2017-03-12 NOTE — Progress Notes (Signed)
Oncologist: [name]  57 yo referred by [name] with history of type II diabetes, HTN, ulcerative colitis and breast cancer presents for cardio-oncology followup.   Breast cancer was diagnosed 3/18, ER-/PR-/HER2+.  She had docetaxel/carboplatin/Herceptin/Perjeta on 05/11/16 for 4 cycles, stopped due to severe diarrhea/dehydration/UC flare.  Currently getting Herceptin/Perjeta every 3 wks and taking Xeloda.  She had lumpectomy in 9/18.   She has completed radiation.   Doing well symptomatically, no dyspnea or chest pain.  She will finish Herceptin in 4/19.    Labs (2/18): K 4.7, creatinine 0.76 Labs (7/18): K 3.8, creatinine 1.0  PMH: 1. Type II diabetes 2. HTN 3. Ulcerative colitis 4. Breast cancer: Diagnosed 3/18, ER-/PR-/HER2+.  She had docetaxel/carboplatin/Herceptin/Perjeta x 4 cycles.  Now getting q3 wk Herceptin/Perjeta as well as Xeloda.  Lumpectomy 9/18.    - Echo (4/18): EF 55-60%, GLS -13.3%, normal RV size and systolic function (technically difficult study).  - Echo (7/18): EF 60-65%, GLS -18.6%, normal RV size and systolic function.  - Echo (10/18): EF 55-60%, GLS -17.1% (difficult strain images), normal diastolic function, normal RV size and systolic function.  - Echo (2/19): EF 55-60%, GLS -18.6%  Social History   Socioeconomic History  . Marital status: Married    Spouse name: Not on file  . Number of children: 0  . Years of education: Not on file  . Highest education level: Not on file  Social Needs  . Financial resource strain: Not on file  . Food insecurity - worry: Not on file  . Food insecurity - inability: Not on file  . Transportation needs - medical: Not on file  . Transportation needs - non-medical: Not on file  Occupational History  . Not on file  Tobacco Use  . Smoking status: Former Smoker    Packs/day: 1.00    Years: 29.00    Pack years: 29.00    Types: Cigarettes    Last attempt to quit: 10/27/2002    Years since quitting: 14.3  . Smokeless  tobacco: Never Used  Substance and Sexual Activity  . Alcohol use: No    Alcohol/week: 0.0 oz  . Drug use: No  . Sexual activity: Yes    Birth control/protection: Surgical  Other Topics Concern  . Not on file  Social History Narrative  . Not on file   Family History  Problem Relation Age of Onset  . Colon cancer Father   . Stomach cancer Paternal Uncle   . Stomach cancer Paternal Uncle   . Melanoma Brother   . Thyroid cancer Brother   . Breast cancer Maternal Aunt   . Breast cancer Maternal Aunt   . Breast cancer Cousin    ROS: All systems reviewed and negative except as per HPI.   Current Outpatient Medications  Medication Sig Dispense Refill  . albuterol (PROVENTIL HFA;VENTOLIN HFA) 108 (90 Base) MCG/ACT inhaler Inhale 1-2 puffs into the lungs every 6 (six) hours as needed for wheezing or shortness of breath.    . capecitabine (XELODA) 500 MG tablet Take  4 tabs = 2000 mg  BID  For  2  Weeks  On  ,  1  Week  Off. 112 tablet 1  . diazepam (VALIUM) 5 MG tablet Take 1 tablet (5 mg total) by mouth as directed. Take 5 mg by mouth 30 minutes prior to MRI procedure.   May repeat x 1. DO NOT Drive after taking Valium as it may cause drowsiness. 2 tablet 0  . dicyclomine (  BENTYL) 20 MG tablet Take 1 tablet (20 mg total) by mouth 4 (four) times daily -  before meals and at bedtime. 30 tablet 0  . diphenoxylate-atropine (LOMOTIL) 2.5-0.025 MG tablet Take one to two tablets every six hours as needed for diarrhea 120 tablet 1  . ferrous sulfate 325 (65 FE) MG tablet Take 325 mg by mouth 2 (two) times daily with a meal.    . gabapentin (NEURONTIN) 100 MG capsule Take 200 mg by mouth at bedtime.     . Homeopathic Products (ZICAM ALLERGY RELIEF NA) Take 1 tablet by mouth daily.    [name] Kitchen HYDROcodone-acetaminophen (NORCO/VICODIN) 5-325 MG tablet Take 1-2 tablets by mouth every 6 (six) hours as needed for moderate pain. 20 tablet 0  . hydrocortisone (ANUSOL-HC) 25 MG suppository Place 1 suppository  (25 mg total) rectally 2 (two) times daily. (Patient taking differently: Place 25 mg rectally as needed. ) 12 suppository 0  . lidocaine-prilocaine (EMLA) cream Apply 1 application to skin 1.5 to 2 hrs before use.  Cover to secure cream with plastic wrap. 30 g PRN  . loperamide (IMODIUM) 1 MG/5ML solution Take 3 mg by mouth as needed for diarrhea or loose stools.    [name] Kitchen LORazepam (ATIVAN) 0.5 MG tablet Take 1 tablet (0.5 mg total) by mouth once as needed for anxiety. 2 tablet 0  . magnesium oxide (MAG-OX) 400 MG tablet Take 400 mg by mouth daily.     . Mesalamine 800 MG TBEC Take 1,600 mg by mouth 2 (two) times daily.    . metFORMIN (GLUCOPHAGE-XR) 500 MG 24 hr tablet Take 1,000 mg by mouth at bedtime.    . metoprolol (LOPRESSOR) 50 MG tablet Take 50 mg by mouth 2 (two) times daily. Takes 1 in the am and 1 at HS    . niacin 250 MG tablet Take 250 mg by mouth at bedtime.     . ondansetron (ZOFRAN) 8 MG tablet Take 1 tablet (8 mg total) by mouth 2 (two) times daily as needed for refractory nausea / vomiting. Start on day 3 after chemo. 30 tablet 1  . potassium chloride SA (K-DUR,KLOR-CON) 20 MEQ tablet Take 1 tablet (20 mEq total) by mouth 2 (two) times daily. 60 tablet 2  . prochlorperazine (COMPAZINE) 10 MG tablet Take 1 tablet (10 mg total) by mouth every 6 (six) hours as needed (Nausea or vomiting). 30 tablet 1  . traMADol (ULTRAM) 50 MG tablet Take 1-2 tablets (50-100 mg total) by mouth every 6 (six) hours as needed. 40 tablet 0  . triamterene-hydrochlorothiazide (MAXZIDE) 75-50 MG per tablet Take 0.5 tablets by mouth daily.      No current facility-administered medications for this encounter.    Facility-Administered Medications Ordered in Other Encounters  Medication Dose Route Frequency Provider Last Rate Last Dose  . heparin lock flush 100 unit/mL  500 Units Intracatheter Once PRN Truitt Merle, MD      . sodium chloride flush (NS) 0.9 % injection 10 mL  10 mL Intracatheter PRN Truitt Merle, MD       . sodium chloride flush (NS) 0.9 % injection 10 mL  10 mL Intracatheter PRN Truitt Merle, MD   10 mL at 08/24/16 1725  . sodium chloride flush (NS) 0.9 % injection 10 mL  10 mL Intracatheter PRN Truitt Merle, MD   10 mL at 11/16/16 1628     BP (!) 140/92   Pulse 70   Wt 215 lb (97.5 kg)   SpO2 98%  BMI 36.33 kg/m  General: NAD Neck: No JVD, no thyromegaly or thyroid nodule.  Lungs: Clear to auscultation bilaterally with normal respiratory effort. CV: Nondisplaced PMI.  Heart regular S1/S2, no S3/S4, no murmur.  No peripheral edema.  No carotid bruit.  Normal pedal pulses.  Abdomen: Soft, nontender, no hepatosplenomegaly, no distention.  Skin: Intact without lesions or rashes.  Neurologic: Alert and oriented x 3.  Psych: Normal affect. Extremities: No clubbing or cyanosis.  HEENT: Normal.   Assessment/Plan: 1. Breast cancer: HER2+ breast cancer.  She is getting a Herceptin-based regimen. I reviewed today's echo.  LV EF and strain remain stable.   - Continue Herceptin, she will have her last screening echo in 3 months.   2. HTN: BP borderline elevated, has been controlled at home.   Followup in 3 months with echo.    Loralie Champagne 03/12/2017

## 2017-03-12 NOTE — Patient Instructions (Signed)
Echocardiogram and follow up in 3 months

## 2017-03-15 ENCOUNTER — Telehealth: Payer: Self-pay | Admitting: *Deleted

## 2017-03-15 NOTE — Telephone Encounter (Signed)
Received call from pt stating that she has a wicked cough & congestion in her chest but not productive & no fever.  She is at the end of her second week of xeloda.  She wants to know if she can take mucinex.  Informed should be fine & increase oral fluids & call if any yellow-green secretions or fever.  Informed Dr Burr Medico & she is in agreement.

## 2017-03-19 ENCOUNTER — Telehealth: Payer: Self-pay | Admitting: *Deleted

## 2017-03-19 NOTE — Telephone Encounter (Signed)
"  Taking Mucinex as instructed for cough that started Friday.  Still have cough and need to know what else to take.  Should I use my inhaler?   No chills or fever.  Cough produces white mucus at times, no color.  Drinking more orange juice than water .  No seasonal allergies.  No body aches except to chest from coughing.  May have been exposed to something at work because everyone comes to work even if they're sick."  Denies need for symptom management today.  "I'l drink more water, use my inhaler and call tomorrow if I feel poorly and need to be seen."

## 2017-03-21 ENCOUNTER — Telehealth: Payer: Self-pay | Admitting: *Deleted

## 2017-03-21 DIAGNOSIS — J069 Acute upper respiratory infection, unspecified: Secondary | ICD-10-CM | POA: Diagnosis not present

## 2017-03-21 NOTE — Telephone Encounter (Signed)
Pt called stating that she saw PCP today, and was told that she has bronchitis.  Pt is taking Zithromycin and Amoxicillin. Pt is on  OFF  Week from  Xeloda.   Informed pt that she did not need to come in tomorrow 03/22/17 as per [name].   Appts will be rescheduled for next Wed  03/27/17.  Pt voiced understanding. Schedule message sent.

## 2017-03-22 ENCOUNTER — Ambulatory Visit: Payer: 59 | Admitting: Hematology

## 2017-03-22 ENCOUNTER — Other Ambulatory Visit: Payer: 59

## 2017-03-22 ENCOUNTER — Ambulatory Visit: Payer: 59

## 2017-03-22 ENCOUNTER — Telehealth: Payer: Self-pay | Admitting: Hematology

## 2017-03-22 NOTE — Telephone Encounter (Signed)
Appointments scheduled and patient has been notified per 2/21 sched msg

## 2017-03-27 ENCOUNTER — Ambulatory Visit: Payer: 59 | Admitting: Hematology

## 2017-03-27 NOTE — Progress Notes (Signed)
[place]  [phone]) [phone] Fax:(336) [phone]  Clinic Follow Up Note   Patient Care Team: Orpah Melter, MD as PCP - General (Family Medicine) [name], MD as Consulting Physician (General Surgery) Truitt Merle, MD as Consulting Physician (Hematology) Gery Pray, MD as Consulting Physician (Radiation Oncology) Ladene Artist, MD as Consulting Physician (Gastroenterology)   Date of Service:  03/29/2017   CHIEF COMPLAINTS:  Follow up left breast cancer  Oncology History   Cancer Staging Breast cancer of upper-outer quadrant of left female breast [facility]) Staging form: Breast, AJCC 8th Edition - Clinical stage from 04/25/2016: Stage IIA (cT2, cN0, cM0, G3, ER: Negative, PR: Negative, HER2: Positive) - Signed by Truitt Merle, MD on 05/02/2016 - Pathologic stage from 10/09/2016: No Stage Recommended (ypT2, pN1a, cM0, G3, ER: Negative, PR: Negative, HER2: Positive) - Signed by Truitt Merle, MD on 10/26/2016       Breast cancer of upper-outer quadrant of left female breast ([place])   04/24/2016 Mammogram    Category B breasts with a new irregular mass in the UOQ left breast middle depth. Ultrasound revealed a 3.9 cm mass in the 1:00 position. The left axilla was negative.       04/25/2016 Initial Biopsy    Biopsy of the left breast showed grade 3 invasive ductal carcinoma, DCIS, and lymphovascular invasion was present.      04/25/2016 Receptors her2    ER 0% negative, PR 0% negative, HER2 positive, Ki67 30%      05/02/2016 Initial Diagnosis    Breast cancer of upper-outer quadrant of left female breast ([place])      05/07/2016 Imaging    MRI of the bilateral breast 05/07/16 IMPRESSION: Lobulated enhancing mass (4.0 x 2.7 x 3.1 cm) in the upper-outer quadrant of the left breast corresponding with the recently diagnosed invasive mammary carcinoma. Linear enhancement extends 2.6 cm posterior to the mass worrisome for ductal carcinoma in-situ.      05/09/2016  Echocardiogram    Echo 05/09/16 -LF EF: 55-60%      05/11/2016 - 08/31/2016 Neo-Adjuvant Chemotherapy    Cycle 1 Docetaxel, Carboplatin, Herceptin and pejeta (TCHP), cycle 2 Docetaxel changed to Taxol, carbo dose reduced to AUC 4.5 (from 5) due to severe diarrhea and cytopenia, Perjeta held after cycle 2.   Due to poor toleration, [name] was held after cycle 4 and Taxol changed to weekly X4. Herceptin was continued during the above chemo.        05/13/2016 Imaging    CT Angio Chest PE IMPRESSION: No evidence of pulmonary emboli. Left breast mass consistent with the given clinical history. Stable left adrenal lesion likely representing a small adenoma.      05/18/2016 - 05/24/2016 Hospital Admission    Patient presented with nausea, vomiting, and diarrhea; admitted to hospital with Hyponatremia      06/07/2016 - 06/09/2016 Hospital Admission    Patient presents to hospital complaints of rectal bleeding and abdominal cramps when defacating      06/07/2016 Imaging    CT ABDOMEN PELVIS W CONTRAST  IMPRESSION: 1. Wall thickening of the descending and sigmoid colon consistent with an infectious or inflammatory colitis. 2. There is also a short segment of narrowing and possible wall thickening of the superior ascending colon which could reflect a constricting mass or, more likely, be an area of persistent colonic spasm. This could be further assessed with either colonoscopy or a barium enema after the current symptoms of colitis have resolved. 3. Small, subcentimeter, low-density liver  lesions which may all be benign. However, 3 these are not evident on prior CT. Liver metastatic disease possible. These could be further assessed with liver MRI with and without contrast. 4. Adrenal lesions which are stable, on the right and myelolipoma and on the left most likely an adenoma.      06/07/2016 Imaging    CT A/P IMPRESSION: 1. Wall thickening of the descending and sigmoid colon  consistent with an infectious or inflammatory colitis. 2. There is also a short segment of narrowing and possible wall thickening of the superior ascending colon which could reflect a constricting mass or, more likely, be an area of persistent colonic spasm. This could be further assessed with either colonoscopy or a barium enema after the current symptoms of colitis have resolved. 3. Small, subcentimeter, low-density liver lesions which may all be benign. However, 3 these are not evident on prior CT. Liver metastatic disease possible. These could be further assessed with liver MRI with and without contrast. 4. Adrenal lesions which are stable, on the right and myelolipoma and on the left most likely an adenoma.      06/07/2016 - 06/09/2016 Hospital Admission    Diarrhea and rectal Bleeding      07/19/2016 - 07/20/2016 Hospital Admission    Diarrhea and dehydration      08/09/2016 Mammogram    Mammogram 08/09/16 IMPRESSION:  The 3.1 cm x 2 cm x 1.5 cm irregular equal density mass in the left breast is consistent with the known carcinoma showing mammographic evidence of preoperative chemotherapy response.         08/09/2016 Imaging    [place] of left breast 08/09/16 IMPRESSION:  The 2.7 cm lobulated mass in the left breast is a known biopsy positive for malignancy. Surgical and oncology consult in progress.      08/16/2016 Imaging    MRI Abdomen W WO Contrast IMPRESSION: Tiny sub-cm hepatic cysts. No evidence of metastatic disease or other acute findings. Tiny benign left adrenal adenoma and right adrenal myelolipoma.      09/14/2016 -  Chemotherapy    Maintenance Herceptin every 3 weeks. Added Perjeta back on 10/26/16      09/17/2016 Imaging    MRI Breast Bilateral 09/17/16 IMPRESSION: Smaller left breast mass, now measuring 2.5 cm. No new or suspicious enhancement in either breast.  RECOMMENDATION: Treatment plan.      10/09/2016 Surgery    LEFT BREAST LUMPECTOMY WITH  RADIOACTIVE SEED AND L4EFT AXILLARY SENTINEL LYMPH NODE BIOPSY by Dr. Lucia Gaskins on 10/09/16      10/09/2016 Pathology Results    Diagnosis 10/09/16 1. Breast, lumpectomy, Left - INVASIVE DUCTAL CARCINOMA, GRADE 3, SPANNING 2.4 CM. - HIGH GRADE DUCTAL CARCINOMA IN SITU WITH NECROSIS. - RESECTION MARGINS ARE NEGATIVE FOR CARCINOMA. - BIOPSY SITE. - SEE ONCOLOGY TABLE. 2. Breast, excision, Left additional medial margin - FIBROCYSTIC CHANGE. - NO MALIGNANCY IDENTIFIED. 3. Lymph node, sentinel, biopsy, Left Axillary - METASTATIC CARCINOMA IN ONE OF ONE LYMPH NODES (1/1).        11/22/2016 - 01/10/2017 Radiation Therapy    Radiation with Dr. Sondra Come and complete on 01/10/17      11/22/2016 -  Chemotherapy    Xeloda With radiation she will be on low dose 4 tablets in the am and 3 tablets in the pm. Starting 11/22/16 and complete on 01/10/17.   Will continue Xeloda, 14 days on and 7 days off, 4 tablets BID starting 01/18/17       03/12/2017 Echocardiogram  ECHO 03/12/17 Impressions: - Normal LV size with EF 55-60%. Strain as above. Normal RV size   and systolic function. No significant valvular abnormalities.       HISTORY OF PRESENTING ILLNESS (05/02/16):  Brandi Dickson 57 y.o. female is here because of a new diagnosis of left breast cancer. She is accompanied by her husband to our multidisciplinary breast clinic today.  The patient presented with a palpable left breast lump approximately 2 weeks ago. Bilateral diagnostic mammogram on 04/24/16 showed Category B breasts with a new irregular mass in the UOQ left breast middle depth. Ultrasound performed on 04/24/16 revealed a 3.9 cm mass in the 1:00 position. The left axilla was negative.  Biopsy of the left breast on 04/25/16 showed grade 3 invasive ductal carcinoma, DCIS, and lymphovascular invasion was present (ER 0% negative, PR 0% negative, HER2 positive, Ki67 30%).  She denies tenderness of the biopsied area. Reports minor  dimpling. The patient is 8 weeks out from a left hip replacement. She had a right hip replacement 2010 years ago.  The patient and her husband present today in multidisciplinary breast clinic to discuss treatment options for the management of her disease.  The patient is taking Neurontin for neuropathy in her feet from diabetes. She has been diagnosed for diabetes for the past 4 years. She states she only has neuropathy at night and take 2 Neurontin at night and then has no symptoms.  GYN HISTORY  Menarchal: 11 LMP: Complete hysterectomy for uterine fibroids ~ 2009. She was bleeding too much with her last menstrual cycle lasting 6 weeks. No issues with menopause. Contraceptive: no  HRT: No GP: G1P0  CURRENT THERAPY:  1. Maintenance Herceptin every 3 weeks, started on 06/01/16. Add Perjeta back starting 10/26/16. 2. Xeloda With concurrent with 2053m in am and 1503mpm, started 11/22/16 and completed on 01/10/17.  3. Continue Xeloda 200045m12h, 14 days on and 7 days off,  starting 01/18/17 for 4 months. Dose reduced to 2000m42m and 1500mg61mfrom cycle 2 due to severe diarrhea    INTERVAL HISTORY:  RebecLIZANNE ERKERrns for follow up. She presets to the infusion room today by herself. She reports she had bronchitis last week and saw her PCP. She completed amoxicillin and states she feels much better. She reports she has a mild amount of post nasal drip. She states she did well with her last cycle of Xeloda with a tolerable amount of diarrhea. She notes to still have a darker pigmentation on her hands. She states she states her K supplement twice daily  On review of systems, pt denies pain, or any other complaints at this time. Pertinent positives are listed and detailed within the above HPI.    MEDICAL HISTORY:  Past Medical History:  Diagnosis Date  . Arthritis   . Asthma    triggered with EsteeMindi Curlingumes and cigarette smoke  . Cancer (HCC) [place] Colitis   . Diabetes mellitus  without complication (HCC) [place] History of radiation therapy 11/22/16-01/10/17   left breast 50.4 Gy in 28 fractions, axillary region 45 Gy in 25 fractions, lumpectomy cavity boost 10 Gy tin 5 fractions  . Hypertension   . Neuropathy     SURGICAL HISTORY: Past Surgical History:  Procedure Laterality Date  . ABDOMINAL HYSTERECTOMY    . BREAST LUMPECTOMY WITH RADIOACTIVE SEED AND SENTINEL LYMPH NODE BIOPSY Left 10/09/2016   Procedure: LEFT BREAST LUMPECTOMY WITH RADIOACTIVE SEED AND L4EFT AXILLARY SENTINEL  LYMPH NODE BIOPSY;  Surgeon: [name], MD;  Location: [facility];  Service: General;  Laterality: Left;  . DILATION AND CURETTAGE OF UTERUS    . KNEE ARTHROSCOPY Left   . PORTACATH PLACEMENT Right 05/08/2016   Procedure: INSERTION PORT-A-CATH WITH [place];  Surgeon: [name], MD;  Location: [facility];  Service: General;  Laterality: Right;  . TONSILLECTOMY    . TOTAL HIP ARTHROPLASTY Right   . TOTAL HIP ARTHROPLASTY Left 03/06/2016   Procedure: LEFT TOTAL HIP ARTHROPLASTY ANTERIOR APPROACH;  Surgeon: [name], MD;  Location: [facility];  Service: Orthopedics;  Laterality: Left;    SOCIAL HISTORY: Social History   Socioeconomic History  . Marital status: Married    Spouse name: Not on file  . Number of children: 0  . Years of education: Not on file  . Highest education level: Not on file  Social Needs  . Financial resource strain: Not on file  . Food insecurity - worry: Not on file  . Food insecurity - inability: Not on file  . Transportation needs - medical: Not on file  . Transportation needs - non-medical: Not on file  Occupational History  . Not on file  Tobacco Use  . Smoking status: Former Smoker    Packs/day: 1.00    Years: 29.00    Pack years: 29.00    Types: Cigarettes    Last attempt to quit: 10/27/2002    Years since quitting: 14.4  . Smokeless tobacco: Never Used  Substance and Sexual Activity  . Alcohol use: No     Alcohol/week: 0.0 oz  . Drug use: No  . Sexual activity: Yes    Birth control/protection: Surgical  Other Topics Concern  . Not on file  Social History Narrative  . Not on file    FAMILY HISTORY: Family History  Problem Relation Age of Onset  . Colon cancer Father   . Stomach cancer Paternal Uncle   . Stomach cancer Paternal Uncle   . Melanoma Brother   . Thyroid cancer Brother   . Breast cancer Maternal Aunt   . Breast cancer Maternal Aunt   . Breast cancer Cousin     ALLERGIES:  is allergic to lisinopril; augmentin [amoxicillin-pot clavulanate]; benadryl [diphenhydramine]; and losartan potassium.  MEDICATIONS:  Current Outpatient Medications  Medication Sig Dispense Refill  . albuterol (PROVENTIL HFA;VENTOLIN HFA) 108 (90 Base) MCG/ACT inhaler Inhale 1-2 puffs into the lungs every 6 (six) hours as needed for wheezing or shortness of breath.    . capecitabine (XELODA) 500 MG tablet Take  4 tabs = 2000 mg  BID  For  2  Weeks  On  ,  1  Week  Off. 112 tablet 1  . diazepam (VALIUM) 5 MG tablet Take 1 tablet (5 mg total) by mouth as directed. Take 5 mg by mouth 30 minutes prior to MRI procedure.   May repeat x 1. DO NOT Drive after taking Valium as it may cause drowsiness. 2 tablet 0  . dicyclomine (BENTYL) 20 MG tablet Take 1 tablet (20 mg total) by mouth 4 (four) times daily -  before meals and at bedtime. 30 tablet 0  . diphenoxylate-atropine (LOMOTIL) 2.5-0.025 MG tablet Take one to two tablets every six hours as needed for diarrhea 120 tablet 1  . ferrous sulfate 325 (65 FE) MG tablet Take 325 mg by mouth 2 (two) times daily with a meal.    . gabapentin (NEURONTIN) 100 MG capsule Take 200  mg by mouth at bedtime.     . Homeopathic Products (ZICAM ALLERGY RELIEF NA) Take 1 tablet by mouth daily.    [name] Kitchen HYDROcodone-acetaminophen (NORCO/VICODIN) 5-325 MG tablet Take 1-2 tablets by mouth every 6 (six) hours as needed for moderate pain. 20 tablet 0  . hydrocortisone (ANUSOL-HC) 25 MG  suppository Place 1 suppository (25 mg total) rectally 2 (two) times daily. (Patient taking differently: Place 25 mg rectally as needed. ) 12 suppository 0  . lidocaine-prilocaine (EMLA) cream Apply 1 application to skin 1.5 to 2 hrs before use.  Cover to secure cream with plastic wrap. 30 g PRN  . loperamide (IMODIUM) 1 MG/5ML solution Take 3 mg by mouth as needed for diarrhea or loose stools.    [name] Kitchen LORazepam (ATIVAN) 0.5 MG tablet Take 1 tablet (0.5 mg total) by mouth once as needed for anxiety. 2 tablet 0  . magnesium oxide (MAG-OX) 400 MG tablet Take 400 mg by mouth daily.     . Mesalamine 800 MG TBEC Take 1,600 mg by mouth 2 (two) times daily.    . Mesalamine 800 MG TBEC TAKE 1 TABLET BY MOUTH THREE TIMES DAILY 90 tablet 0  . metFORMIN (GLUCOPHAGE-XR) 500 MG 24 hr tablet Take 1,000 mg by mouth at bedtime.    . metoprolol (LOPRESSOR) 50 MG tablet Take 50 mg by mouth 2 (two) times daily. Takes 1 in the am and 1 at HS    . niacin 250 MG tablet Take 250 mg by mouth at bedtime.     . ondansetron (ZOFRAN) 8 MG tablet Take 1 tablet (8 mg total) by mouth 2 (two) times daily as needed for refractory nausea / vomiting. Start on day 3 after chemo. 30 tablet 1  . potassium chloride SA (K-DUR,KLOR-CON) 20 MEQ tablet Take 1 tablet (20 mEq total) by mouth 2 (two) times daily. 60 tablet 2  . prochlorperazine (COMPAZINE) 10 MG tablet Take 1 tablet (10 mg total) by mouth every 6 (six) hours as needed (Nausea or vomiting). 30 tablet 1  . traMADol (ULTRAM) 50 MG tablet Take 1-2 tablets (50-100 mg total) by mouth every 6 (six) hours as needed. 40 tablet 0  . triamterene-hydrochlorothiazide (MAXZIDE) 75-50 MG per tablet Take 0.5 tablets by mouth daily.      No current facility-administered medications for this visit.    Facility-Administered Medications Ordered in Other Visits  Medication Dose Route Frequency Provider Last Rate Last Dose  . heparin lock flush 100 unit/mL  500 Units Intracatheter Once PRN Truitt Merle, MD      . sodium chloride flush (NS) 0.9 % injection 10 mL  10 mL Intracatheter PRN Truitt Merle, MD      . sodium chloride flush (NS) 0.9 % injection 10 mL  10 mL Intracatheter PRN Truitt Merle, MD   10 mL at 08/24/16 1725  . sodium chloride flush (NS) 0.9 % injection 10 mL  10 mL Intracatheter PRN Truitt Merle, MD   10 mL at 11/16/16 1628  . sodium chloride flush (NS) 0.9 % injection 10 mL  10 mL Intracatheter PRN Truitt Merle, MD   10 mL at 03/29/17 1117   REVIEW OF SYSTEMS:   Constitutional: Denies fevers, chills or abnormal night sweats  Eyes: Denies blurriness of vision, double vision or watery eyes Ears, nose, mouth, throat, and face: Denies mucositis or sore throat Respiratory: Denies cough, dyspnea or wheezes Cardiovascular: Denies palpitation.  Gastrointestinal:  Denies heartburn. Denies nausea, vomiting. (+) mild diarrhea, manageable  Skin:  Denies abnormal skin rashes Breast: (+) diffuse erythema on left breast and axilla and skin peeling from radiation Lymphatics: Denies new lymphadenopathy or easy bruising Neurological: (+) neuropathy in hands/feet, unchanged Behavioral/Psych: Mood is stable, no new changes  All other systems were reviewed with the patient and are negative.   PHYSICAL EXAMINATION:  ECOG PERFORMANCE STATUS: 1 - Symptomatic but completely ambulatory Blood pressure 123/69, heart rate 68, respirations 16, T 37.4, pulse ox 99% on room air GENERAL:alert, no distress and comfortable SKIN: skin color, texture, turgor are normal, no rashes or significant lesions EYES: normal, conjunctiva are pink and non-injected, sclera clear OROPHARYNX:no exudate, no erythema and lips, buccal mucosa, and tongue normal  NECK: supple, thyroid normal size, non-tender, without nodularity LYMPH:  no palpable lymphadenopathy in the cervical, axillary or inguinal LUNGS: clear to auscultation and percussion with normal breathing effort HEART: regular rate & rhythm and no murmurs and no lower  extremity edema ABDOMEN:abdomen soft, non-tender and normal bowel sounds Musculoskeletal:no cyanosis of digits and no clubbing  PSYCH: alert & oriented x 3 with fluent speech NEURO: no focal motor/sensory deficits BREAST: (+) s/p lumpectomy inc in UOQ of left breast, healing well, no discharge, palpable mass or scar. Skin hyperpigmentation on left breast from radiation, no skin ulcers or skin breakdown.   LABORATORY DATA:  I have reviewed the data as listed CBC Latest Ref Rng & Units 03/29/2017 03/01/2017 02/08/2017  WBC 3.9 - 10.3 K/uL 6.1 6.9 5.6  Hemoglobin 11.6 - 15.9 g/dL 12.7 11.7 12.3  Hematocrit 34.8 - 46.6 % 39.2 35.5 37.6  Platelets 145 - 400 K/uL 238 215 219   CMP Latest Ref Rng & Units 03/29/2017 03/01/2017 02/08/2017  Glucose 70 - 140 mg/dL 112 96 97  BUN 7 - 26 mg/dL _0 Creatinine 0.60 - 1.10 mg/dL 0.84 0.78 0.88  Sodium 136 - 145 mmol/L 139 139 140  Potassium 3.5 - 5.1 mmol/L 3.6 3.7 3.3  Chloride 98 - 109 mmol/L 103 102 101  CO2 22 - 29 mmol/L _1 Calcium 8.4 - 10.4 mg/dL 9.8 9.2 9.2  Total Protein 6.4 - 8.3 g/dL 7.2 6.4 6.6  Total Bilirubin 0.2 - 1.2 mg/dL 0.8 0.4 0.5  Alkaline Phos 40 - 150 U/L 95 89 96  AST 5 - 34 U/L _2 ALT 0 - 55 U/L _3 PATHOLOGY REPORT:    Diagnosis 10/09/16 1. Breast, lumpectomy, Left - INVASIVE DUCTAL CARCINOMA, GRADE 3, SPANNING 2.4 CM. - HIGH GRADE DUCTAL CARCINOMA IN SITU WITH NECROSIS. - RESECTION MARGINS ARE NEGATIVE FOR CARCINOMA. - BIOPSY SITE. - SEE ONCOLOGY TABLE. 2. Breast, excision, Left additional medial margin - FIBROCYSTIC CHANGE. - NO MALIGNANCY IDENTIFIED. 3. Lymph node, sentinel, biopsy, Left Axillary - METASTATIC CARCINOMA IN ONE OF ONE LYMPH NODES (1/1). Microscopic Comment 1. BREAST, STATUS POST NEOADJUVANT TREATMENT Procedure: Left lumpectomy with additional medial margin excision, left axillary sentinel lymph node biopsy. Laterality: Left. Tumor Size: 2.4 cm. Histologic Type: Invasive  ductal carcinoma. Grade: 3 Tubular Differentiation: 3 Nuclear Pleomorphism: 3 Mitotic Count: 3 Ductal Carcinoma in Situ (DCIS): Present, high grade with necrosis. Regional Lymph Nodes: Number of Lymph Nodes Examined: 1 Number of Sentinel Lymph Nodes Examined: 1 Lymph Nodes with Macrometastases: 1 Lymph Nodes with Micrometastases: 0 Lymph Nodes with Isolated Tumor Cells: 0 Margins: Invasive carcinoma, distance from closest margin: >0.5 cm all original margins. DCIS, distance from closest margin: >0.5 cm all original margins. Extent of Tumor: Confined to  breast parenchyma. Breast Prognostic Profile (pre-neoadjuvant case #: [id number], see below). Estrogen Receptor: Negative. Progesterone Receptor: Negative. Her2: Positive(ratio 2.25). Ki-67: 30% 2 of 4 FINAL for Hirschi, Yaslene P ([id number]) Microscopic Comment(continued) Will be repeated on the current case (Block #: [id number]) and the results reported separately. Residual Cancer Burden (RCB): Primary Tumor Bed: 24 mm x 20 mm Overall Cancer Cellularity: 75% Percentage of Cancer that is in Situ: 5% Number of Positive Lymph Nodes: 1 Diameter of Largest Lymph Node metastasis: 3 mm Residual Cancer Burden : 3.439 Residual Cancer Burden Class: RCB-III Pathologic Stage Classification (p TNM, AJCC 8th Edition): Primary Tumor (ypT): ypT2 Regional Lymph Nodes (ypN): ypN1a 1. PROGNOSTIC INDICATORS Results: IMMUNOHISTOCHEMICAL AND MORPHOMETRIC ANALYSIS PERFORMED MANUALLY Estrogen Receptor: 30%, POSITIVE, WEAK STAINING INTENSITY Progesterone Receptor: 0%, NEGATIVE COMMENT: The negative hormone receptor study(ies) in this case has an internal positive control. REFERENCE RANGE ESTROGEN RECEPTOR NEGATIVE 0% POSITIVE =>1% REFERENCE RANGE PROGESTERONE RECEPTOR NEGATIVE 0% POSITIVE =>1% All controls stained appropriately Enid Cutter MD Pathologist, Electronic Signature ( Signed 10/16/2016) 1. FLUORESCENCE IN-SITU  HYBRIDIZATION Results: HER2 - **POSITIVE** RATIO OF HER2/CEP17 SIGNALS 2.67 AVERAGE HER2 COPY NUMBER PER CELL 4.00 Reference Range: NEGATIVE HER2/CEP17 Ratio <2.0 and average HER2 copy number <4.0 L DIAG NOSIS Diagnosis 04/25/16 Breast, left, needle core biopsy - INVASIVE DUCTAL CARCINOMA, SEE COMMENT. - DUCTAL CARCINOMA IN SITU. - LYMPHOVASCULAR INVASION PRESENT. Microscopic Comment The carcinoma appears grade 3 with focal squamous differentiation. Prognostic markers will be ordered. Dr. Lyndon Code has reviewed the case. The case was called to Dr. Isaiah Blakes on 04/26/2016. Vicente Males MD Pathologist, Electronic Signature (Case signed 04/26/2016) ADDITIONAL INFORMATION: 04/25/16 PROGNOSTIC INDICATORS Results: IMMUNOHISTOCHEMICAL AND MORPHOMETRIC ANALYSIS PERFORMED MANUALLY Estrogen Receptor: 0%, NEGATIVE Progesterone Receptor: 0%, NEGATIVE Proliferation Marker Ki67: 30% COMMENT: The negative hormone receptor study(ies) in this case has no internal positive control. REFERENCE RANGE ESTROGEN RECEPTOR NEGATIVE 0% POSITIVE =>1% REFERENCE RANGE PROGESTERONE RECEPTOR NEGATIVE 0% POSITIVE =>1% All controls stained appropriately Enid Cutter MD Pathologist, Electronic Signature ( Signed 05/01/2016) FLUORESCENCE IN-SITU HYBRIDIZATION Results: HER2 - **POSITIVE** RATIO OF HER2/CEP17 SIGNALS 2.25 AVERAGE HER2 COPY NUMBER PER CELL 8.45 1 of 3 FINAL for Villena, Merin P ([id number]) ADDITIONAL INFORMATION:(continued) Reference Range: NEGATIVE HER2/CEP17 Ratio <2.0 and average HER2 copy number <4.0 EQUIVOCAL HER2/CEP17 Ratio <2.0 and average HER2 copy number 4.0 and <6.0 POSITIVE HER2/CEP17 Ratio >=2.0 or <2.0 and average HER2 copy number >=6.0 Enid Cutter MD Pathologist, Electronic Signature ( Signed 04/30/2016)   PROCEDURES  ECHO 03/12/17 Impressions: - Normal LV size with EF 55-60%. Strain as above. Normal RV size   and systolic function. No significant valvular  abnormalities.  ECHO 11/21/16  Impressions: - Normal LV size and systolic function, EF 37-10%. Normal diastolic   function. Global longitudinal strain less negative than prior but   images not as clean. Normal RV size and systolic function.  Echo 05/09/16 Study Conclusions -LF EF: 55-60% - Left ventricle: The cavity size was normal. There was mild focal   basal hypertrophy of the septum. Indeterminant diastolic   function. Systolic function was normal. The estimated ejection   fraction was in the range of 55% to 60%. Wall motion was normal;   there were no regional wall motion abnormalities. GLS abnormal at   -13.3%, poor images however.   RADIOGRAPHIC STUDIES: I have personally reviewed the radiological images as listed and agreed with the findings in the report.   MRI Breast Bilateral 09/17/16 IMPRESSION: Smaller left breast mass, now measuring 2.5 cm. No new or suspicious enhancement in  either breast. RECOMMENDATION: Treatment plan.  MRI Abdomen W WO Contrast 08/16/16 IMPRESSION: Tiny sub-cm hepatic cysts. No evidence of metastatic disease or other acute findings. Tiny benign left adrenal adenoma and right adrenal myelolipoma.  Mammogram 08/09/16 IMPRESSION:  The 3.1 cm x 2 cm x 1.5 cm irregular equal density mass in the left breast is consistent with the known carcinoma showing mammographic evidence of preoperative chemotherapy response.   [place] of left breast 08/09/16 IMPRESSION:  The 2.7 cm lobulated mass in the left breast is a known biopsy positive for malignancy. Surgical and oncology consult in progress.   See onc history CT ABD/Pelvi 5/10/18s W CONTRAST 06/07/16 IMPRESSION: 1. Wall thickening of the descending and sigmoid colon consistent with an infectious or inflammatory colitis. 2. There is also a short segment of narrowing and possible wall thickening of the superior ascending colon which could reflect a constricting mass or, more likely, be an area of  persistent colonic spasm. This could be further assessed with either colonoscopy or a barium enema after the current symptoms of colitis have resolved. 3. Small, subcentimeter, low-density liver lesions which may all be benign. However, 3 these are not evident on prior CT. Liver metastatic disease possible. These could be further assessed with liver MRI with and without contrast. 4. Adrenal lesions which are stable, on the right and myelolipoma and on the left most likely an adenoma.  CT Angio Chest PE W and/or wo Contrast 05/13/16 IMPRESSION: No evidence of pulmonary emboli.  ASSESSMENT & PLAN: 57 y.o. post-menopausal Caucasian female with a self palpated left breast mass.  1. Breast cancer of upper-outer quadrant of left breast, invasive ductal carcinoma,  stage IIA (cT2N0M0) grade 3, ER-, PR-, HER2 amplified, [id number] -We previously reviewed the patient's imaging and pathology. -We previously reviewed her staging and biology of her breast cancer  -We previously discussed that surgical resection is the definitive treatment for breast cancer, she was seen by breast surgeon Dr. Lucia Gaskins today, lumpectomy versus mastectomy were discussed with patient. -We previously discussed HER2 positive breast cancers total approximately 15% of breast cancers and happens to be more aggressive than HER2 negative cancers, especially ER and PR negative disease, she has high likelihood of cancer recurrence after complete surgical resection.  -I recommended neoadjuvant chemotherapy TCHP (docetaxel, carboplatin, Herceptin and perjeta) every 3 weeks for 6 cycles, followed by maintenance Herceptin and perjeta to complete 1 year therapy -Her baseline echo was normal, she was seen by cardiologist Dr. Benjamine Mola -She unfortunately developed severe diarrhea and cytopenia after chemotherapy, and was hospitalized for several times.  -Chemotherapy was subsequently postponed and dose reduced, and stopped after 4 months  treatment. -She underwent left breast lumpectomy on 10/09/16 and the pathology results show significant residual disease, RCB-III -Due to her residual cancer and positive lymph node I also recommend adjuvant chemo Xeloda for 6 months.  She started Xeloda with concurrent radiation, tolerating well so far.  We will continue -Due to her significant residual disease after neoadjuvant chemotherapy, I recommend her to continue Herceptin and perjeta maintenance therapy, to complete a total of one year therapy.  -Echo 11/21/16 LVEF 60-04%, normal systolic and diastolic function -She started adjuvant chemoradiation with Xeloda on 11/22/16 and completed on 01/10/17. She has experienced mild diarrhea with Xeloda, managed with imodium and lomotil. She has mild-moderate skin toxicity. OTC hydrocortisone topically to hands PRN to reduce inflammation was recommended.  -She is healing well from radiation -She will continue Xeloda at full dose 4 tabs BID for 2 weeks  on and 1 week off, started on 01/18/17 to continue for additional 4 months.  -continue maintenance Herceptin and perjeta every 3 weeks until April 2019  -Due to severe diarrhea, her Xeloda dose was decreased from 2000 mg twice daily to 20664m in am and 15069min pm on 02/11/17.  -I previously recommended soaking her hands in warm water and vinegar to protect her skin from infection -I will refer her to survivorship clinic after her adjuvant chemo  -She had bronchitis last week, recovered well, treatment postponed for a week.  -Labs reviewed, Okay proceed with next cycle Xeloda, plan to complete after 1 more cycle  -F/u in 3 weeks   2. Genetics -Given her strong family history of breast cancer, we recommend her to see genetic counseling to ruled out inheritable breast cancer syndrome. She agreed. -Genetic counseling scheduled but she has not been seen, I referred her again  -She has no children. -She declined genetic testing at this time.  -She knows to  continue cancer screenings.   3. Type 2 Diabetes mellitus, HTN -Managed by her PCP. -The patient has peripheral neuropathy in her feet from her diabetes. The patient is already on Neurontin with 200 mg at night. We previously discussed that chemotherapy may make her neuropathy worse. -Steroids will be given to reduce chemo side effects and I will reduce dexa to [id number] to not affect her blood sugar much. -We'll monitor her blood glucose and blood pressure closely during her chemotherapy treatment. -DM and HTN, controlled.  -Her ECHO from 03/12/17 revealed normal LV size with EF 55-60%. Strain as above. Normal RV size and systolic function. No significant valvular abnormalities.  4. Ulcerative colitis -We previously discussed that chemotherapy would cause diarrhea and the patient's colitis may exacerbate during chemo  -She is not taking steroids for this. She is on mesalamine, will follow up with Dr. StaFuller PlanShe had severe diarrhea after first cycle chemotherapy, was treated for ulcerative colitis flare -I previously advised the patient to keep herself adequately hydrated and to take Imodium PRN. - I again previously advised the patient to take Lomotil and Imodium frequently as needed. -Since change in chemo her diarrhea has resolved.    5. Arthritis  -s/p b/l hip replacement, last surgery in February 2018.  -Tylenol every 6-8 hours as needed was recommended.  -I previously encouraged her to f/u with PCP for arthritic pain management   6. Peripheral Neuropathy, secondary to chemotherapy -primarily typing and her neuropathy is worse following bouts of typing. I previously encouraged her to use her left hand more often and begin wearing cotton gloves.   -She is currently on Gabapentin for her baseline neuropathy associated w/ her DM, she currently takes this TID.  -She also began vitamin B12 in combination with her Gabapentin -She has worsening neuropathy with mild to moderate decreased  vibratory sense in hands/feet that is worse at night -she takes 200 mg Neurontin at night, may increase to 300-400 mg at bedtime   7. Hypokalemia  secondary to chemotherapy  -10/26/16 labs showed a 3.0 potassium, she increased potassium to BID for the next 5 days then back to once daily   -She now takes potassium BID, K 3.2 on 12/14/16 -Her potassium remains at 3.3 on 01/11/17. She will continue potassium BID. I also suggest she increase potassium in her diet.  -She is doing well with her K supplement twice daily, 3.6 today. I advised her to increase K if she experiences diarrhea.  PLAN -Continue Xeloda 2046m in am and 15078min pm, for 2 weeks on, one-week off, she will start her next cycle today, plan to complete after 1 more cycle -She will proceed Herceptin and perjeta infusion today and continue every 3 weeks  -F/u in 3 weeks with lab   No orders of the defined types were placed in this encounter.  All questions were answered. The patient knows to call the clinic with any problems, questions or concerns.  I spent 15 minutes counseling the patient face to face. The total time spent in the appointment was 20 minutes and more than 50% was on counseling.  This document serves as a record of services personally performed by YaTruitt MerleMD. It was created on her behalf by DaTheresia Bougha trained medical scribe. The creation of this record is based on the scribe's personal observations and the provider's statements to them.   I have reviewed the above documentation for accuracy and completeness, and I agree with the above.    YaTruitt MerleMD 03/29/2017 1:05 PM

## 2017-03-29 ENCOUNTER — Other Ambulatory Visit: Payer: Self-pay | Admitting: Gastroenterology

## 2017-03-29 ENCOUNTER — Encounter: Payer: Self-pay | Admitting: Hematology

## 2017-03-29 ENCOUNTER — Inpatient Hospital Stay: Payer: 59

## 2017-03-29 ENCOUNTER — Inpatient Hospital Stay: Payer: 59 | Attending: Hematology

## 2017-03-29 ENCOUNTER — Inpatient Hospital Stay (HOSPITAL_BASED_OUTPATIENT_CLINIC_OR_DEPARTMENT_OTHER): Payer: 59 | Admitting: Hematology

## 2017-03-29 VITALS — BP 123/69 | HR 68 | Temp 99.3°F | Resp 16

## 2017-03-29 DIAGNOSIS — I10 Essential (primary) hypertension: Secondary | ICD-10-CM | POA: Diagnosis not present

## 2017-03-29 DIAGNOSIS — Z171 Estrogen receptor negative status [ER-]: Secondary | ICD-10-CM

## 2017-03-29 DIAGNOSIS — M1990 Unspecified osteoarthritis, unspecified site: Secondary | ICD-10-CM | POA: Insufficient documentation

## 2017-03-29 DIAGNOSIS — Z95828 Presence of other vascular implants and grafts: Secondary | ICD-10-CM

## 2017-03-29 DIAGNOSIS — Z5112 Encounter for antineoplastic immunotherapy: Secondary | ICD-10-CM | POA: Insufficient documentation

## 2017-03-29 DIAGNOSIS — K5190 Ulcerative colitis, unspecified, without complications: Secondary | ICD-10-CM | POA: Insufficient documentation

## 2017-03-29 DIAGNOSIS — E1140 Type 2 diabetes mellitus with diabetic neuropathy, unspecified: Secondary | ICD-10-CM | POA: Diagnosis not present

## 2017-03-29 DIAGNOSIS — C50412 Malignant neoplasm of upper-outer quadrant of left female breast: Secondary | ICD-10-CM

## 2017-03-29 DIAGNOSIS — E876 Hypokalemia: Secondary | ICD-10-CM | POA: Insufficient documentation

## 2017-03-29 DIAGNOSIS — D649 Anemia, unspecified: Secondary | ICD-10-CM | POA: Diagnosis not present

## 2017-03-29 DIAGNOSIS — E119 Type 2 diabetes mellitus without complications: Secondary | ICD-10-CM

## 2017-03-29 LAB — COMPREHENSIVE METABOLIC PANEL
ALT: 11 U/L (ref 0–55)
AST: 13 U/L (ref 5–34)
Albumin: 3.3 g/dL — ABNORMAL LOW (ref 3.5–5.0)
Alkaline Phosphatase: 95 U/L (ref 40–150)
Anion gap: 12 — ABNORMAL HIGH (ref 3–11)
BUN: 14 mg/dL (ref 7–26)
CHLORIDE: 103 mmol/L (ref 98–109)
CO2: 24 mmol/L (ref 22–29)
CREATININE: 0.84 mg/dL (ref 0.60–1.10)
Calcium: 9.8 mg/dL (ref 8.4–10.4)
Glucose, Bld: 112 mg/dL (ref 70–140)
POTASSIUM: 3.6 mmol/L (ref 3.5–5.1)
Sodium: 139 mmol/L (ref 136–145)
TOTAL PROTEIN: 7.2 g/dL (ref 6.4–8.3)
Total Bilirubin: 0.8 mg/dL (ref 0.2–1.2)

## 2017-03-29 LAB — CBC WITH DIFFERENTIAL/PLATELET
BASOS ABS: 0 10*3/uL (ref 0.0–0.1)
Basophils Relative: 1 %
EOS ABS: 0.2 10*3/uL (ref 0.0–0.5)
EOS PCT: 4 %
HCT: 39.2 % (ref 34.8–46.6)
HEMOGLOBIN: 12.7 g/dL (ref 11.6–15.9)
LYMPHS ABS: 1.6 10*3/uL (ref 0.9–3.3)
Lymphocytes Relative: 26 %
MCH: 31.7 pg (ref 25.1–34.0)
MCHC: 32.4 g/dL (ref 31.5–36.0)
MCV: 97.8 fL (ref 79.5–101.0)
Monocytes Absolute: 0.5 10*3/uL (ref 0.1–0.9)
Monocytes Relative: 8 %
Neutro Abs: 3.8 10*3/uL (ref 1.5–6.5)
Neutrophils Relative %: 61 %
PLATELETS: 238 10*3/uL (ref 145–400)
RBC: 4.01 MIL/uL (ref 3.70–5.45)
RDW: 17.8 % — ABNORMAL HIGH (ref 11.2–14.5)
WBC: 6.1 10*3/uL (ref 3.9–10.3)

## 2017-03-29 MED ORDER — ACETAMINOPHEN 325 MG PO TABS
650.0000 mg | ORAL_TABLET | Freq: Once | ORAL | Status: AC
  Administered 2017-03-29: 650 mg via ORAL

## 2017-03-29 MED ORDER — CAPECITABINE 500 MG PO TABS: ORAL_TABLET | ORAL | 1 refills | Status: DC

## 2017-03-29 MED ORDER — HEPARIN SOD (PORK) LOCK FLUSH 100 UNIT/ML IV SOLN
500.0000 [IU] | Freq: Once | INTRAVENOUS | Status: AC | PRN
  Administered 2017-03-29: 500 [IU]
  Filled 2017-03-29: qty 5

## 2017-03-29 MED ORDER — SODIUM CHLORIDE 0.9% FLUSH
10.0000 mL | INTRAVENOUS | Status: DC | PRN
  Administered 2017-03-29: 10 mL
  Filled 2017-03-29: qty 10

## 2017-03-29 MED ORDER — ACETAMINOPHEN 325 MG PO TABS
ORAL_TABLET | ORAL | Status: AC
  Filled 2017-03-29: qty 2

## 2017-03-29 MED ORDER — TRASTUZUMAB CHEMO 150 MG IV SOLR
600.0000 mg | Freq: Once | INTRAVENOUS | Status: AC
  Administered 2017-03-29: 600 mg via INTRAVENOUS
  Filled 2017-03-29: qty 28.57

## 2017-03-29 MED ORDER — SODIUM CHLORIDE 0.9 % IV SOLN
420.0000 mg | Freq: Once | INTRAVENOUS | Status: AC
  Administered 2017-03-29: 420 mg via INTRAVENOUS
  Filled 2017-03-29: qty 14

## 2017-03-29 MED ORDER — SODIUM CHLORIDE 0.9% FLUSH
10.0000 mL | Freq: Once | INTRAVENOUS | Status: AC
  Administered 2017-03-29: 10 mL
  Filled 2017-03-29: qty 10

## 2017-03-29 MED ORDER — SODIUM CHLORIDE 0.9 % IV SOLN
Freq: Once | INTRAVENOUS | Status: AC
  Administered 2017-03-29: 09:00:00 via INTRAVENOUS

## 2017-03-29 NOTE — Patient Instructions (Signed)
[facility] Discharge Instructions for Patients Receiving Chemotherapy  Today you received the following chemotherapy agents Herceptin and Perjeta.   To help prevent nausea and vomiting after your treatment, we encourage you to take your nausea medication as directed.   If you develop nausea and vomiting that is not controlled by your nausea medication, call the clinic.   BELOW ARE SYMPTOMS THAT SHOULD BE REPORTED IMMEDIATELY:  *FEVER GREATER THAN 100.5 F  *CHILLS WITH OR WITHOUT FEVER  NAUSEA AND VOMITING THAT IS NOT CONTROLLED WITH YOUR NAUSEA MEDICATION  *UNUSUAL SHORTNESS OF BREATH  *UNUSUAL BRUISING OR BLEEDING  TENDERNESS IN MOUTH AND THROAT WITH OR WITHOUT PRESENCE OF ULCERS  *URINARY PROBLEMS  *BOWEL PROBLEMS  UNUSUAL RASH Items with * indicate a potential emergency and should be followed up as soon as possible.  Feel free to call the clinic should you have any questions or concerns. The clinic phone number is [phone].  Please show the CHEMO ALERT CARD at check-in to the Emergency Department and triage nurse.   

## 2017-04-01 ENCOUNTER — Telehealth: Payer: Self-pay | Admitting: Hematology

## 2017-04-01 NOTE — Telephone Encounter (Signed)
Appointment was rescheduled per 3/1 sched msg/ Letter / Calendar mailed to patient.

## 2017-04-04 DIAGNOSIS — N183 Chronic kidney disease, stage 3 (moderate): Secondary | ICD-10-CM | POA: Diagnosis not present

## 2017-04-04 DIAGNOSIS — E1122 Type 2 diabetes mellitus with diabetic chronic kidney disease: Secondary | ICD-10-CM | POA: Diagnosis not present

## 2017-04-04 DIAGNOSIS — I10 Essential (primary) hypertension: Secondary | ICD-10-CM | POA: Diagnosis not present

## 2017-04-08 ENCOUNTER — Other Ambulatory Visit: Payer: Self-pay | Admitting: *Deleted

## 2017-04-08 DIAGNOSIS — Z170 Estrogen receptor positive status [ER+]: Principal | ICD-10-CM

## 2017-04-08 DIAGNOSIS — C50412 Malignant neoplasm of upper-outer quadrant of left female breast: Secondary | ICD-10-CM

## 2017-04-08 DIAGNOSIS — R197 Diarrhea, unspecified: Secondary | ICD-10-CM

## 2017-04-08 MED ORDER — DIPHENOXYLATE-ATROPINE 2.5-0.025 MG PO TABS: ORAL_TABLET | ORAL | 1 refills | Status: DC

## 2017-04-12 ENCOUNTER — Ambulatory Visit: Payer: 59 | Admitting: Hematology

## 2017-04-12 ENCOUNTER — Other Ambulatory Visit: Payer: 59

## 2017-04-12 ENCOUNTER — Ambulatory Visit: Payer: 59

## 2017-04-18 ENCOUNTER — Encounter: Payer: Self-pay | Admitting: Hematology

## 2017-04-18 DIAGNOSIS — R928 Other abnormal and inconclusive findings on diagnostic imaging of breast: Secondary | ICD-10-CM | POA: Diagnosis not present

## 2017-04-19 ENCOUNTER — Inpatient Hospital Stay: Payer: 59

## 2017-04-19 ENCOUNTER — Inpatient Hospital Stay (HOSPITAL_BASED_OUTPATIENT_CLINIC_OR_DEPARTMENT_OTHER): Payer: 59 | Admitting: Nurse Practitioner

## 2017-04-19 ENCOUNTER — Encounter: Payer: Self-pay | Admitting: Nurse Practitioner

## 2017-04-19 VITALS — BP 122/68 | HR 69 | Temp 98.2°F | Resp 18 | Ht 64.5 in | Wt 212.9 lb

## 2017-04-19 DIAGNOSIS — C50412 Malignant neoplasm of upper-outer quadrant of left female breast: Secondary | ICD-10-CM

## 2017-04-19 DIAGNOSIS — E876 Hypokalemia: Secondary | ICD-10-CM

## 2017-04-19 DIAGNOSIS — Z171 Estrogen receptor negative status [ER-]: Secondary | ICD-10-CM

## 2017-04-19 DIAGNOSIS — E1140 Type 2 diabetes mellitus with diabetic neuropathy, unspecified: Secondary | ICD-10-CM | POA: Diagnosis not present

## 2017-04-19 DIAGNOSIS — D649 Anemia, unspecified: Secondary | ICD-10-CM | POA: Diagnosis not present

## 2017-04-19 DIAGNOSIS — Z5112 Encounter for antineoplastic immunotherapy: Secondary | ICD-10-CM | POA: Diagnosis not present

## 2017-04-19 DIAGNOSIS — Z95828 Presence of other vascular implants and grafts: Secondary | ICD-10-CM

## 2017-04-19 LAB — COMPREHENSIVE METABOLIC PANEL
ALBUMIN: 3 g/dL — AB (ref 3.5–5.0)
ALT: 8 U/L (ref 0–55)
AST: 12 U/L (ref 5–34)
Alkaline Phosphatase: 90 U/L (ref 40–150)
Anion gap: 10 (ref 3–11)
BUN: 11 mg/dL (ref 7–26)
CHLORIDE: 102 mmol/L (ref 98–109)
CO2: 27 mmol/L (ref 22–29)
Calcium: 9.3 mg/dL (ref 8.4–10.4)
Creatinine, Ser: 0.82 mg/dL (ref 0.60–1.10)
GFR calc Af Amer: >60 mL/min (ref >60)
GLUCOSE: 116 mg/dL (ref 70–140)
POTASSIUM: 3.3 mmol/L — AB (ref 3.5–5.1)
Sodium: 139 mmol/L (ref 136–145)
Total Bilirubin: 0.7 mg/dL (ref 0.2–1.2)
Total Protein: 6.7 g/dL (ref 6.4–8.3)

## 2017-04-19 LAB — CBC WITH DIFFERENTIAL/PLATELET
BASOS ABS: 0 10*3/uL (ref 0.0–0.1)
BASOS PCT: 0 %
EOS ABS: 0.4 10*3/uL (ref 0.0–0.5)
EOS PCT: 4 %
HCT: 34.6 % — ABNORMAL LOW (ref 34.8–46.6)
Hemoglobin: 11.5 g/dL — ABNORMAL LOW (ref 11.6–15.9)
Lymphocytes Relative: 15 %
Lymphs Abs: 1.4 10*3/uL (ref 0.9–3.3)
MCH: 32.2 pg (ref 25.1–34.0)
MCHC: 33.2 g/dL (ref 31.5–36.0)
MCV: 96.9 fL (ref 79.5–101.0)
MONO ABS: 0.5 10*3/uL (ref 0.1–0.9)
Monocytes Relative: 6 %
Neutro Abs: 6.8 10*3/uL — ABNORMAL HIGH (ref 1.5–6.5)
Neutrophils Relative %: 75 %
PLATELETS: 225 10*3/uL (ref 145–400)
RBC: 3.57 MIL/uL — ABNORMAL LOW (ref 3.70–5.45)
RDW: 18.1 % — AB (ref 11.2–14.5)
WBC: 9.1 10*3/uL (ref 3.9–10.3)

## 2017-04-19 MED ORDER — ACETAMINOPHEN 325 MG PO TABS
650.0000 mg | ORAL_TABLET | Freq: Once | ORAL | Status: AC
  Administered 2017-04-19: 650 mg via ORAL

## 2017-04-19 MED ORDER — SODIUM CHLORIDE 0.9% FLUSH
10.0000 mL | Freq: Once | INTRAVENOUS | Status: AC
  Administered 2017-04-19: 10 mL
  Filled 2017-04-19: qty 10

## 2017-04-19 MED ORDER — SODIUM CHLORIDE 0.9% FLUSH
10.0000 mL | INTRAVENOUS | Status: DC | PRN
  Administered 2017-04-19: 10 mL
  Filled 2017-04-19: qty 10

## 2017-04-19 MED ORDER — HEPARIN SOD (PORK) LOCK FLUSH 100 UNIT/ML IV SOLN
500.0000 [IU] | Freq: Once | INTRAVENOUS | Status: AC | PRN
  Administered 2017-04-19: 500 [IU]
  Filled 2017-04-19: qty 5

## 2017-04-19 MED ORDER — ACETAMINOPHEN 325 MG PO TABS
ORAL_TABLET | ORAL | Status: AC
  Filled 2017-04-19: qty 2

## 2017-04-19 MED ORDER — SODIUM CHLORIDE 0.9 % IV SOLN
Freq: Once | INTRAVENOUS | Status: AC
  Administered 2017-04-19: 12:00:00 via INTRAVENOUS

## 2017-04-19 MED ORDER — PERTUZUMAB CHEMO INJECTION 420 MG/14ML
420.0000 mg | Freq: Once | INTRAVENOUS | Status: AC
  Administered 2017-04-19: 420 mg via INTRAVENOUS
  Filled 2017-04-19: qty 14

## 2017-04-19 MED ORDER — TRASTUZUMAB CHEMO 150 MG IV SOLR
600.0000 mg | Freq: Once | INTRAVENOUS | Status: AC
  Administered 2017-04-19: 600 mg via INTRAVENOUS
  Filled 2017-04-19: qty 28.57

## 2017-04-19 NOTE — Patient Instructions (Signed)
[facility] Discharge Instructions for Patients Receiving Chemotherapy  Today you received the following chemotherapy agents Herceptin and Perjeta.   To help prevent nausea and vomiting after your treatment, we encourage you to take your nausea medication as directed.   If you develop nausea and vomiting that is not controlled by your nausea medication, call the clinic.   BELOW ARE SYMPTOMS THAT SHOULD BE REPORTED IMMEDIATELY:  *FEVER GREATER THAN 100.5 F  *CHILLS WITH OR WITHOUT FEVER  NAUSEA AND VOMITING THAT IS NOT CONTROLLED WITH YOUR NAUSEA MEDICATION  *UNUSUAL SHORTNESS OF BREATH  *UNUSUAL BRUISING OR BLEEDING  TENDERNESS IN MOUTH AND THROAT WITH OR WITHOUT PRESENCE OF ULCERS  *URINARY PROBLEMS  *BOWEL PROBLEMS  UNUSUAL RASH Items with * indicate a potential emergency and should be followed up as soon as possible.  Feel free to call the clinic should you have any questions or concerns. The clinic phone number is [phone].  Please show the [facility] at check-in to the Emergency Department and triage nurse.

## 2017-04-19 NOTE — Patient Instructions (Signed)

## 2017-04-19 NOTE — Progress Notes (Signed)
Brandi Dickson  [phone] [phone]  Clinic Follow up Note   Patient Care Team: Orpah Melter, MD as PCP - General (Family Medicine) [name], MD as Consulting Physician (General Surgery) Truitt Merle, MD as Consulting Physician (Hematology) Gery Pray, MD as Consulting Physician (Radiation Oncology) Ladene Artist, MD as Consulting Physician (Gastroenterology) 04/19/2017  SUMMARY OF ONCOLOGIC HISTORY: Oncology History   Cancer Staging Breast cancer of upper-outer quadrant of left female breast [facility]) Staging form: Breast, AJCC 8th Edition - Clinical stage from 04/25/2016: Stage IIA (cT2, cN0, cM0, G3, ER: Negative, PR: Negative, HER2: Positive) - Signed by Truitt Merle, MD on 05/02/2016 - Pathologic stage from 10/09/2016: No Stage Recommended (ypT2, pN1a, cM0, G3, ER: Negative, PR: Negative, HER2: Positive) - Signed by Truitt Merle, MD on 10/26/2016       Breast cancer of upper-outer quadrant of left female breast ([place])   04/24/2016 Mammogram    Category B breasts with a new irregular mass in the UOQ left breast middle depth. Ultrasound revealed a 3.9 cm mass in the 1:00 position. The left axilla was negative.       04/25/2016 Initial Biopsy    Biopsy of the left breast showed grade 3 invasive ductal carcinoma, DCIS, and lymphovascular invasion was present.      04/25/2016 Receptors her2    ER 0% negative, PR 0% negative, HER2 positive, Ki67 30%      05/02/2016 Initial Diagnosis    Breast cancer of upper-outer quadrant of left female breast ([place])      05/07/2016 Imaging    MRI of the bilateral breast 05/07/16 IMPRESSION: Lobulated enhancing mass (4.0 x 2.7 x 3.1 cm) in the upper-outer quadrant of the left breast corresponding with the recently diagnosed invasive mammary carcinoma. Linear enhancement extends 2.6 cm posterior to the mass worrisome for ductal carcinoma in-situ.      05/09/2016 Echocardiogram    Echo 05/09/16 -LF EF: 55-60%        05/11/2016 - 08/31/2016 Neo-Adjuvant Chemotherapy    Cycle 1 Docetaxel, Carboplatin, Herceptin and pejeta (TCHP), cycle 2 Docetaxel changed to Taxol, carbo dose reduced to AUC 4.5 (from 5) due to severe diarrhea and cytopenia, Perjeta held after cycle 2.   Due to poor toleration, [name] was held after cycle 4 and Taxol changed to weekly X4. Herceptin was continued during the above chemo.        05/13/2016 Imaging    CT Angio Chest PE IMPRESSION: No evidence of pulmonary emboli. Left breast mass consistent with the given clinical history. Stable left adrenal lesion likely representing a small adenoma.      05/18/2016 - 05/24/2016 Hospital Admission    Patient presented with nausea, vomiting, and diarrhea; admitted to hospital with Hyponatremia      06/07/2016 - 06/09/2016 Hospital Admission    Patient presents to hospital complaints of rectal bleeding and abdominal cramps when defacating      06/07/2016 Imaging    CT ABDOMEN PELVIS W CONTRAST  IMPRESSION: 1. Wall thickening of the descending and sigmoid colon consistent with an infectious or inflammatory colitis. 2. There is also a short segment of narrowing and possible wall thickening of the superior ascending colon which could reflect a constricting mass or, more likely, be an area of persistent colonic spasm. This could be further assessed with either colonoscopy or a barium enema after the current symptoms of colitis have resolved. 3. Small, subcentimeter, low-density liver lesions which may all be benign. However, 3 these are not  evident on prior CT. Liver metastatic disease possible. These could be further assessed with liver MRI with and without contrast. 4. Adrenal lesions which are stable, on the right and myelolipoma and on the left most likely an adenoma.      06/07/2016 Imaging    CT A/P IMPRESSION: 1. Wall thickening of the descending and sigmoid colon consistent with an infectious or inflammatory colitis. 2.  There is also a short segment of narrowing and possible wall thickening of the superior ascending colon which could reflect a constricting mass or, more likely, be an area of persistent colonic spasm. This could be further assessed with either colonoscopy or a barium enema after the current symptoms of colitis have resolved. 3. Small, subcentimeter, low-density liver lesions which may all be benign. However, 3 these are not evident on prior CT. Liver metastatic disease possible. These could be further assessed with liver MRI with and without contrast. 4. Adrenal lesions which are stable, on the right and myelolipoma and on the left most likely an adenoma.      06/07/2016 - 06/09/2016 Hospital Admission    Diarrhea and rectal Bleeding      07/19/2016 - 07/20/2016 Hospital Admission    Diarrhea and dehydration      08/09/2016 Mammogram    Mammogram 08/09/16 IMPRESSION:  The 3.1 cm x 2 cm x 1.5 cm irregular equal density mass in the left breast is consistent with the known carcinoma showing mammographic evidence of preoperative chemotherapy response.         08/09/2016 Imaging    [place] of left breast 08/09/16 IMPRESSION:  The 2.7 cm lobulated mass in the left breast is a known biopsy positive for malignancy. Surgical and oncology consult in progress.      08/16/2016 Imaging    MRI Abdomen W WO Contrast IMPRESSION: Tiny sub-cm hepatic cysts. No evidence of metastatic disease or other acute findings. Tiny benign left adrenal adenoma and right adrenal myelolipoma.      09/14/2016 -  Chemotherapy    Maintenance Herceptin every 3 weeks. Added Perjeta back on 10/26/16      09/17/2016 Imaging    MRI Breast Bilateral 09/17/16 IMPRESSION: Smaller left breast mass, now measuring 2.5 cm. No new or suspicious enhancement in either breast.  RECOMMENDATION: Treatment plan.      10/09/2016 Surgery    LEFT BREAST LUMPECTOMY WITH RADIOACTIVE SEED AND L4EFT AXILLARY SENTINEL LYMPH NODE BIOPSY by  Dr. Lucia Gaskins on 10/09/16      10/09/2016 Pathology Results    Diagnosis 10/09/16 1. Breast, lumpectomy, Left - INVASIVE DUCTAL CARCINOMA, GRADE 3, SPANNING 2.4 CM. - HIGH GRADE DUCTAL CARCINOMA IN SITU WITH NECROSIS. - RESECTION MARGINS ARE NEGATIVE FOR CARCINOMA. - BIOPSY SITE. - SEE ONCOLOGY TABLE. 2. Breast, excision, Left additional medial margin - FIBROCYSTIC CHANGE. - NO MALIGNANCY IDENTIFIED. 3. Lymph node, sentinel, biopsy, Left Axillary - METASTATIC CARCINOMA IN ONE OF ONE LYMPH NODES (1/1).        11/22/2016 - 01/10/2017 Radiation Therapy    Radiation with Dr. Sondra Come and complete on 01/10/17      11/22/2016 -  Chemotherapy    Xeloda With radiation she will be on low dose 4 tablets in the am and 3 tablets in the pm. Starting 11/22/16 and complete on 01/10/17.   Will continue Xeloda, 14 days on and 7 days off, 4 tablets BID starting 01/18/17       03/12/2017 Echocardiogram    ECHO 03/12/17 Impressions: - Normal LV size with EF 55-60%.  Strain as above. Normal RV size   and systolic function. No significant valvular abnormalities.      CURRENT THERAPY:  1. Maintenance Herceptin every 3 weeks, started on 06/01/16. Add Perjeta back starting 10/26/16. 2. Xeloda With concurrent with 2036m in am and 15062mpm, started 11/22/16 and completed on 01/10/17.  3. Continue Xeloda 200033m12h, 14 days on and 7 days off,  starting 01/18/17 for 4 months. Dose reduced to 2000m53m and 1500mg93mfrom cycle 2 due to severe diarrhea    INTERVAL HISTORY: Ms. BenneSparkmanrns for follow up as scheduled prior to next cycle herceptin/perjeta and Xeloda. She completed last cycle xeloda 2000 mg AM/1500 mg PM x2 weeks on 1 week off from 03/31/17 to 3/17. Hands mildly pink, dark and sensitive. Applies aveeno lotion BID. Neuropathy is stable overall, takes 2 gabapentin at night. She occasionally takes 3 if pain in her hands and feet is really bad. Continues to be able to function and work. Has 2  episodes loose stool daily at baseline, controlled with imodium and lomotil. She had increased diarrhea this past week due to poor food choices. No nausea, vomiting, or mucositis. She recovered from bronchitis, no fever, chills, cough, or dyspnea. Pain in her joints is stable. Had mammogram yesterday, left breast remains slightly dark and occasionally sore. No lump, discharge, or nipple change.   REVIEW OF SYSTEMS:   Constitutional: Denies fevers, chills (+) intentional weight loss (+) mild fatigue, increased on xeloda  Eyes: Denies blurriness of vision Ears, nose, mouth, throat, and face: Denies mucositis or sore throat Respiratory: Denies cough, dyspnea or wheezes Cardiovascular: Denies palpitation, chest discomfort or lower extremity swelling Gastrointestinal:  Denies nausea, vomiting, constipation, heartburn or change in bowel habits (+) loose stool at baseline, increased diarrhea this week due to diet Skin: Denies abnormal skin rashes (+) mild darkening to left breast (+) mild redness and darkening to hands  Lymphatics: Denies new lymphadenopathy or easy bruising Neurological:Denies or new weaknesses (+) peripheral neuropathy, stable  Behavioral/Psych: Mood is stable, no new changes  All other systems were reviewed with the patient and are negative.  MEDICAL HISTORY:  Past Medical History:  Diagnosis Date  . Arthritis   . Asthma    triggered with EsteeMindi Curlingumes and cigarette smoke  . Cancer (HCC) [place] Colitis   . Diabetes mellitus without complication (HCC) [place] History of radiation therapy 11/22/16-01/10/17   left breast 50.4 Gy in 28 fractions, axillary region 45 Gy in 25 fractions, lumpectomy cavity boost 10 Gy tin 5 fractions  . Hypertension   . Neuropathy     SURGICAL HISTORY: Past Surgical History:  Procedure Laterality Date  . ABDOMINAL HYSTERECTOMY    . BREAST LUMPECTOMY WITH RADIOACTIVE SEED AND SENTINEL LYMPH NODE BIOPSY Left 10/09/2016   Procedure: LEFT BREAST  LUMPECTOMY WITH RADIOACTIVE SEED AND L4EFT AXILLARY SENTINEL LYMPH NODE BIOPSY;  Surgeon: NewmaAlphonsa Overall  [facility];  Laterality: Left;  . DILATION AND CURETTAGE OF UTERUS    . KNEE ARTHROSCOPY Left   . PORTACATH PLACEMENT Right 05/08/2016   Procedure: INSERTION PORT-A-CATH WITH US;  [place]ore[place]geon: DavidAlphonsa Overall  [place] [facility];  Laterality: Right;  . TONSILLECTOMY    . TOTAL HIP ARTHROPLASTY Right   . TOTAL HIP ARTHROPLASTY Left 03/06/2016   Procedure: LEFT TOTAL HIP ARTHROPLASTY ANTERIOR APPROACH;  Surgeon: MatthParalee Cancel  Location: [facility];  Service: Orthopedics;  Laterality: Left;    I have reviewed the social history and family history with the patient and they are unchanged from previous note.  ALLERGIES:  is allergic to lisinopril; augmentin [amoxicillin-pot clavulanate]; benadryl [diphenhydramine]; and losartan potassium.  MEDICATIONS:  Current Outpatient Medications  Medication Sig Dispense Refill  . albuterol (PROVENTIL HFA;VENTOLIN HFA) 108 (90 Base) MCG/ACT inhaler Inhale 1-2 puffs into the lungs every 6 (six) hours as needed for wheezing or shortness of breath.    . capecitabine (XELODA) 500 MG tablet Take  4 tabs in the morning and 3 tab in evening, 2 weeks on and one week off 98 tablet 1  . diazepam (VALIUM) 5 MG tablet Take 1 tablet (5 mg total) by mouth as directed. Take 5 mg by mouth 30 minutes prior to MRI procedure.   May repeat x 1. DO NOT Drive after taking Valium as it may cause drowsiness. 2 tablet 0  . dicyclomine (BENTYL) 20 MG tablet Take 1 tablet (20 mg total) by mouth 4 (four) times daily -  before meals and at bedtime. 30 tablet 0  . diphenoxylate-atropine (LOMOTIL) 2.5-0.025 MG tablet Take one to two tablets every six hours as needed for diarrhea 120 tablet 1  . ferrous sulfate 325 (65 FE) MG tablet Take 325 mg by mouth 2 (two) times daily with a meal.    . gabapentin  (NEURONTIN) 100 MG capsule Take 200 mg by mouth at bedtime.     . Homeopathic Products (ZICAM ALLERGY RELIEF NA) Take 1 tablet by mouth daily.    [name] Kitchen HYDROcodone-acetaminophen (NORCO/VICODIN) 5-325 MG tablet Take 1-2 tablets by mouth every 6 (six) hours as needed for moderate pain. 20 tablet 0  . hydrocortisone (ANUSOL-HC) 25 MG suppository Place 1 suppository (25 mg total) rectally 2 (two) times daily. (Patient taking differently: Place 25 mg rectally as needed. ) 12 suppository 0  . lidocaine-prilocaine (EMLA) cream Apply 1 application to skin 1.5 to 2 hrs before use.  Cover to secure cream with plastic wrap. 30 g PRN  . loperamide (IMODIUM) 1 MG/5ML solution Take 3 mg by mouth as needed for diarrhea or loose stools.    [name] Kitchen LORazepam (ATIVAN) 0.5 MG tablet Take 1 tablet (0.5 mg total) by mouth once as needed for anxiety. 2 tablet 0  . magnesium oxide (MAG-OX) 400 MG tablet Take 400 mg by mouth daily.     . Mesalamine 800 MG TBEC Take 1,600 mg by mouth 2 (two) times daily.    . Mesalamine 800 MG TBEC TAKE 1 TABLET BY MOUTH THREE TIMES DAILY 90 tablet 0  . metFORMIN (GLUCOPHAGE-XR) 500 MG 24 hr tablet Take 1,000 mg by mouth at bedtime.    . metoprolol (LOPRESSOR) 50 MG tablet Take 50 mg by mouth 2 (two) times daily. Takes 1 in the am and 1 at HS    . niacin 250 MG tablet Take 250 mg by mouth at bedtime.     . ondansetron (ZOFRAN) 8 MG tablet Take 1 tablet (8 mg total) by mouth 2 (two) times daily as needed for refractory nausea / vomiting. Start on day 3 after chemo. 30 tablet 1  . potassium chloride SA (K-DUR,KLOR-CON) 20 MEQ tablet Take 1 tablet (20 mEq total) by mouth 2 (two) times daily. 60 tablet 2  . prochlorperazine (COMPAZINE) 10 MG tablet Take 1 tablet (10 mg total) by mouth every 6 (six) hours as needed (Nausea or vomiting). 30 tablet 1  . traMADol (ULTRAM) 50 MG tablet Take 1-2 tablets (  50-100 mg total) by mouth every 6 (six) hours as needed. 40 tablet 0  . triamterene-hydrochlorothiazide  (MAXZIDE) 75-50 MG per tablet Take 0.5 tablets by mouth daily.      No current facility-administered medications for this visit.    Facility-Administered Medications Ordered in Other Visits  Medication Dose Route Frequency Provider Last Rate Last Dose  . heparin lock flush 100 unit/mL  500 Units Intracatheter Once PRN Truitt Merle, MD      . sodium chloride flush (NS) 0.9 % injection 10 mL  10 mL Intracatheter PRN Truitt Merle, MD      . sodium chloride flush (NS) 0.9 % injection 10 mL  10 mL Intracatheter PRN Truitt Merle, MD   10 mL at 08/24/16 1725  . sodium chloride flush (NS) 0.9 % injection 10 mL  10 mL Intracatheter PRN Truitt Merle, MD   10 mL at 11/16/16 1628  . sodium chloride flush (NS) 0.9 % injection 10 mL  10 mL Intracatheter PRN Truitt Merle, MD   10 mL at 04/19/17 1455    PHYSICAL EXAMINATION: ECOG PERFORMANCE STATUS: 1 - Symptomatic but completely ambulatory  Vitals:   04/19/17 1045  BP: 122/68  Pulse: 69  Resp: 18  Temp: 98.2 F (36.8 C)  SpO2: 100%   Filed Weights   04/19/17 1045  Weight: 212 lb 14.4 oz (96.6 kg)    GENERAL:alert, no distress and comfortable SKIN: skin color, texture, turgor are normal, no rashes or significant lesions (+) mild palmar erythema and hyperpigmentation  EYES: normal, Conjunctiva are pink and non-injected, sclera clear OROPHARYNX:no exudate, no erythema and lips, buccal mucosa, and tongue normal  NECK: supple, thyroid normal size, non-tender, without nodularity LYMPH:  no palpable lymphadenopathy in the cervical, axillary or inguinal LUNGS: clear to auscultation and percussion with normal breathing effort HEART: regular rate & rhythm and no murmurs and no lower extremity edema ABDOMEN:abdomen soft, non-tender and normal bowel sounds Musculoskeletal:no cyanosis of digits and no clubbing  NEURO: alert & oriented x 3 with fluent speech, no focal motor/sensory deficits BREAST: Inspection shows them to be symmetrical without nipple discharge or  inversion (+) left breast with mild diffuse hyperpigmentation without edema, erythema, peeling, or skin breakdown. Incisions well healed.  PAC without erythema   LABORATORY DATA:  I have reviewed the data as listed CBC Latest Ref Rng & Units 04/19/2017 03/29/2017 03/01/2017  WBC 3.9 - 10.3 K/uL 9.1 6.1 6.9  Hemoglobin 11.6 - 15.9 g/dL 11.5(L) 12.7 11.7  Hematocrit 34.8 - 46.6 % 34.6(L) 39.2 35.5  Platelets 145 - 400 K/uL 225 238 215     CMP Latest Ref Rng & Units 04/19/2017 03/29/2017 03/01/2017  Glucose 70 - 140 mg/dL 116 112 96  BUN 7 - 26 mg/dL '11 14 12  ' Creatinine 0.60 - 1.10 mg/dL 0.82 0.84 0.78  Sodium 136 - 145 mmol/L 139 139 139  Potassium 3.5 - 5.1 mmol/L 3.3(L) 3.6 3.7  Chloride 98 - 109 mmol/L 102 103 102  CO2 22 - 29 mmol/L '27 24 28  ' Calcium 8.4 - 10.4 mg/dL 9.3 9.8 9.2  Total Protein 6.4 - 8.3 g/dL 6.7 7.2 6.4  Total Bilirubin 0.2 - 1.2 mg/dL 0.7 0.8 0.4  Alkaline Phos 40 - 150 U/L 90 95 89  AST 5 - 34 U/L '12 13 15  ' ALT 0 - 55 U/L '8 11 11   ' PATHOLOGY REPORT:    Diagnosis 10/09/16 1. Breast, lumpectomy, Left - INVASIVE DUCTAL CARCINOMA, GRADE 3, SPANNING 2.4 CM. -  HIGH GRADE DUCTAL CARCINOMA IN SITU WITH NECROSIS. - RESECTION MARGINS ARE NEGATIVE FOR CARCINOMA. - BIOPSY SITE. - SEE ONCOLOGY TABLE. 2. Breast, excision, Left additional medial margin - FIBROCYSTIC CHANGE. - NO MALIGNANCY IDENTIFIED. 3. Lymph node, sentinel, biopsy, Left Axillary - METASTATIC CARCINOMA IN ONE OF ONE LYMPH NODES (1/1). Microscopic Comment 1. BREAST, STATUS POST NEOADJUVANT TREATMENT Procedure: Left lumpectomy with additional medial margin excision, left axillary sentinel lymph node biopsy. Laterality: Left. Tumor Size: 2.4 cm. Histologic Type: Invasive ductal carcinoma. Grade: 3 Tubular Differentiation: 3 Nuclear Pleomorphism: 3 Mitotic Count: 3 Ductal Carcinoma in Situ (DCIS): Present, high grade with necrosis. Regional Lymph Nodes: Number of Lymph Nodes Examined: 1 Number of  Sentinel Lymph Nodes Examined: 1 Lymph Nodes with Macrometastases: 1 Lymph Nodes with Micrometastases: 0 Lymph Nodes with Isolated Tumor Cells: 0 Margins: Invasive carcinoma, distance from closest margin: >0.5 cm all original margins. DCIS, distance from closest margin: >0.5 cm all original margins. Extent of Tumor: Confined to breast parenchyma. Breast Prognostic Profile (pre-neoadjuvant case #: [id number], see below). Estrogen Receptor: Negative. Progesterone Receptor: Negative. Her2: Positive(ratio 2.25). Ki-67: 30% 2 of 4 FINAL for Noteboom, Chelle P ([id number]) Microscopic Comment(continued) Will be repeated on the current case (Block #: [id number]) and the results reported separately. Residual Cancer Burden (RCB): Primary Tumor Bed: 24 mm x 20 mm Overall Cancer Cellularity: 75% Percentage of Cancer that is in Situ: 5% Number of Positive Lymph Nodes: 1 Diameter of Largest Lymph Node metastasis: 3 mm Residual Cancer Burden : 3.439 Residual Cancer Burden Class: RCB-III Pathologic Stage Classification (p TNM, AJCC 8th Edition): Primary Tumor (ypT): ypT2 Regional Lymph Nodes (ypN): ypN1a 1. PROGNOSTIC INDICATORS Results: IMMUNOHISTOCHEMICAL AND MORPHOMETRIC ANALYSIS PERFORMED MANUALLY Estrogen Receptor: 30%, POSITIVE, WEAK STAINING INTENSITY Progesterone Receptor: 0%, NEGATIVE COMMENT: The negative hormone receptor study(ies) in this case has an internal positive control. REFERENCE RANGE ESTROGEN RECEPTOR NEGATIVE 0% POSITIVE =>1% REFERENCE RANGE PROGESTERONE RECEPTOR NEGATIVE 0% POSITIVE =>1% All controls stained appropriately Enid Cutter MD Pathologist, Electronic Signature ( Signed 10/16/2016) 1. FLUORESCENCE IN-SITU HYBRIDIZATION Results: HER2 - **POSITIVE** RATIO OF HER2/CEP17 SIGNALS 2.67 AVERAGE HER2 COPY NUMBER PER CELL 4.00 Reference Range: NEGATIVE HER2/CEP17 Ratio <2.0 and average HER2 copy number <4.0 L DIAG NOSIS Diagnosis 04/25/16 Breast, left, needle core  biopsy - INVASIVE DUCTAL CARCINOMA, SEE COMMENT. - DUCTAL CARCINOMA IN SITU. - LYMPHOVASCULAR INVASION PRESENT. Microscopic Comment The carcinoma appears grade 3 with focal squamous differentiation. Prognostic markers will be ordered. Dr. Lyndon Code has reviewed the case. The case was called to Dr. Isaiah Blakes on 04/26/2016. Vicente Males MD Pathologist, Electronic Signature (Case signed 04/26/2016) ADDITIONAL INFORMATION: 04/25/16 PROGNOSTIC INDICATORS Results: IMMUNOHISTOCHEMICAL AND MORPHOMETRIC ANALYSIS PERFORMED MANUALLY Estrogen Receptor: 0%, NEGATIVE Progesterone Receptor: 0%, NEGATIVE Proliferation Marker Ki67: 30% COMMENT: The negative hormone receptor study(ies) in this case has no internal positive control. REFERENCE RANGE ESTROGEN RECEPTOR NEGATIVE 0% POSITIVE =>1% REFERENCE RANGE PROGESTERONE RECEPTOR NEGATIVE 0% POSITIVE =>1% All controls stained appropriately Enid Cutter MD Pathologist, Electronic Signature ( Signed 05/01/2016) FLUORESCENCE IN-SITU HYBRIDIZATION Results: HER2 - **POSITIVE** RATIO OF HER2/CEP17 SIGNALS 2.25 AVERAGE HER2 COPY NUMBER PER CELL 8.45 1 of 3 FINAL for Sica, Eilidh P ([id number]) ADDITIONAL INFORMATION:(continued) Reference Range: NEGATIVE HER2/CEP17 Ratio <2.0 and average HER2 copy number <4.0 EQUIVOCAL HER2/CEP17 Ratio <2.0 and average HER2 copy number 4.0 and <6.0 POSITIVE HER2/CEP17 Ratio >=2.0 or <2.0 and average HER2 copy number >=6.0 Enid Cutter MD Pathologist, Electronic Signature ( Signed 04/30/2016)   RADIOGRAPHIC STUDIES: I have personally reviewed the radiological images as listed and agreed  with the findings in the report. No results found.   ASSESSMENT & PLAN: 57 y.o. post-menopausal Caucasian female with a self palpated left breast mass.  1. Breast cancer of upper-outer quadrant of left breast, invasive ductal carcinoma,  stage IIA (cT2N0M0) grade 3, ER-, PR-, HER2 amplified, ypT2N1a 2. Genetics - patient declined  genetic testing 3. Type 2 DM, HTN 4. Ulcerative colitis 5. Arthritis 6. Peripheral neuropathy, associated with chemotherapy and DM 7. Hypokalemia  Ms. Hopkinson appears stable. She completed last cycle Xeloda on 04/13/17. She tolerated well with mild fatigue and palmar hyperpigmentation and erythema controlled with topical lotion. She completed another cycle herceptin and perjeta on 03/29/17; and tolerated well. VS and weight stable. Labs reviewed, mild anemia is stable. She has recurrent hypokalemia K 3.3 secondary to diarrhea, on 20 mEq BID. I suggest she control her diarrhea with imodium and lomotil and increase K in her diet. Labs adequate for treatment. Proceed with next cycle herceptin/perjeta, she will return in 3 weeks for final cycle. She will begin final cycle Xeloda 2000 mg AM and 1500 mg PM x14 days on and 7 days off on 3/24. Return in 3 weeks for f/u and herceptin/perjeta.   She underwent [facility]ogram this [facility] she reports was normal. Will obtain report.   PLAN -Labs reviewed, adequate for treatment; proceed with next cycle herceptin/perjeta today.  -Begin last cycle Xeloda 2000 mg AM/1500 mg PM x2 weeks on/7 days off on 04/21/17 -lab and f/u in 3 weeks with final cycle herceptin/perjeta to complete 1 year  All questions were answered. The patient knows to call the clinic with any problems, questions or concerns. No barriers to learning was detected. I spent 25 minutes counseling the patient face to face. The total time spent in the appointment was 30 minutes and more than 50% was on counseling and review of test results     Alla Feeling, NP 04/19/17

## 2017-04-23 ENCOUNTER — Telehealth: Payer: Self-pay | Admitting: *Deleted

## 2017-04-23 NOTE — Telephone Encounter (Signed)
OK to hold Xeloda until infection clears/she completes clinda. Please call her late this week to check on her and see when she plans to restart.  Thanks, Cira Rue NP

## 2017-04-23 NOTE — Telephone Encounter (Signed)
Pt called stating that she has tooth infection since Friday.  Went to see her dentist yesterday, and was prescribed Clindamycin 300 mg TID.   Pt took 2 tablets Clindamycin yesterday.   Pt is also on Xeloda -  Due to restart on "Sunday  04/21/17.  However, pt has not restarted yet.  Wanted to know if there are interactions between the drugs. Spoke with Jesse, oral chemo pharmacist.  There is no interaction between the drugs.   Dr. Feng / Lacie, NP will be notified whether pt should start Xeloda as scheduled or should pt wait until infection resolved. Pt's    Work    Phone      33" [phone].

## 2017-04-23 NOTE — Telephone Encounter (Signed)
Spoke with pt and informed pt of Dr. Ernestina Penna instructions re: HOLD Xeloda while on Clindamycin.   MD wanted to make sure infection is clear. Stated she was prescribed 10 days of antibiotic.   Instructed pt to call office on Monday 04/29/17 to give nurse update and to let nurse know when pt plans on restarting Xeloda.  Pt voiced understanding.

## 2017-04-29 ENCOUNTER — Other Ambulatory Visit: Payer: Self-pay | Admitting: Gastroenterology

## 2017-05-01 ENCOUNTER — Telehealth: Payer: Self-pay | Admitting: *Deleted

## 2017-05-01 NOTE — Telephone Encounter (Signed)
Received vm call from pt asking for call back regarding medications. Returned call & she reports that she finished ATB-clindamycin on Sunday & wants to know when she should restart xeloda.  Informed pt OK to restart & she states she will r/s tonight.

## 2017-05-02 ENCOUNTER — Telehealth: Payer: Self-pay | Admitting: Gastroenterology

## 2017-05-02 DIAGNOSIS — E119 Type 2 diabetes mellitus without complications: Secondary | ICD-10-CM | POA: Diagnosis not present

## 2017-05-02 DIAGNOSIS — H2513 Age-related nuclear cataract, bilateral: Secondary | ICD-10-CM | POA: Diagnosis not present

## 2017-05-02 DIAGNOSIS — Z7984 Long term (current) use of oral hypoglycemic drugs: Secondary | ICD-10-CM | POA: Diagnosis not present

## 2017-05-02 MED ORDER — MESALAMINE 800 MG PO TBEC: 1600.0000 mg | DELAYED_RELEASE_TABLET | Freq: Two times a day (BID) | ORAL | 0 refills | Status: DC

## 2017-05-02 NOTE — Telephone Encounter (Signed)
Prescription sent to the pharmacy until scheduled appt.

## 2017-05-03 ENCOUNTER — Ambulatory Visit: Payer: 59

## 2017-05-03 ENCOUNTER — Ambulatory Visit: Payer: 59 | Admitting: Hematology

## 2017-05-03 ENCOUNTER — Other Ambulatory Visit: Payer: 59

## 2017-05-08 ENCOUNTER — Other Ambulatory Visit: Payer: Self-pay | Admitting: Nurse Practitioner

## 2017-05-08 DIAGNOSIS — E876 Hypokalemia: Secondary | ICD-10-CM

## 2017-05-10 ENCOUNTER — Encounter: Payer: Self-pay | Admitting: *Deleted

## 2017-05-10 ENCOUNTER — Ambulatory Visit (HOSPITAL_BASED_OUTPATIENT_CLINIC_OR_DEPARTMENT_OTHER): Payer: 59 | Admitting: Hematology

## 2017-05-10 ENCOUNTER — Other Ambulatory Visit: Payer: 59

## 2017-05-10 ENCOUNTER — Inpatient Hospital Stay: Payer: 59 | Attending: Hematology

## 2017-05-10 ENCOUNTER — Telehealth: Payer: Self-pay

## 2017-05-10 ENCOUNTER — Ambulatory Visit: Payer: 59

## 2017-05-10 ENCOUNTER — Ambulatory Visit: Payer: 59 | Admitting: Hematology

## 2017-05-10 ENCOUNTER — Inpatient Hospital Stay: Payer: 59

## 2017-05-10 DIAGNOSIS — Z171 Estrogen receptor negative status [ER-]: Secondary | ICD-10-CM | POA: Diagnosis not present

## 2017-05-10 DIAGNOSIS — C50412 Malignant neoplasm of upper-outer quadrant of left female breast: Secondary | ICD-10-CM | POA: Insufficient documentation

## 2017-05-10 DIAGNOSIS — E876 Hypokalemia: Secondary | ICD-10-CM

## 2017-05-10 DIAGNOSIS — E1140 Type 2 diabetes mellitus with diabetic neuropathy, unspecified: Secondary | ICD-10-CM

## 2017-05-10 DIAGNOSIS — Z5112 Encounter for antineoplastic immunotherapy: Secondary | ICD-10-CM | POA: Diagnosis not present

## 2017-05-10 LAB — COMPREHENSIVE METABOLIC PANEL
ALT: 9 U/L (ref 0–55)
AST: 13 U/L (ref 5–34)
Albumin: 3 g/dL — ABNORMAL LOW (ref 3.5–5.0)
Alkaline Phosphatase: 77 U/L (ref 40–150)
Anion gap: 8 (ref 3–11)
BUN: 11 mg/dL (ref 7–26)
CHLORIDE: 104 mmol/L (ref 98–109)
CO2: 27 mmol/L (ref 22–29)
CREATININE: 0.82 mg/dL (ref 0.60–1.10)
Calcium: 8.7 mg/dL (ref 8.4–10.4)
Glucose, Bld: 120 mg/dL (ref 70–140)
POTASSIUM: 3.1 mmol/L — AB (ref 3.5–5.1)
SODIUM: 139 mmol/L (ref 136–145)
Total Bilirubin: 0.4 mg/dL (ref 0.2–1.2)
Total Protein: 6.4 g/dL (ref 6.4–8.3)

## 2017-05-10 LAB — CBC WITH DIFFERENTIAL/PLATELET
Basophils Absolute: 0 10*3/uL (ref 0.0–0.1)
Basophils Relative: 0 %
Eosinophils Absolute: 0.5 10*3/uL (ref 0.0–0.5)
Eosinophils Relative: 7 %
HCT: 36.1 % (ref 34.8–46.6)
HEMOGLOBIN: 11.7 g/dL (ref 11.6–15.9)
LYMPHS ABS: 1.8 10*3/uL (ref 0.9–3.3)
LYMPHS PCT: 22 %
MCH: 30.8 pg (ref 25.1–34.0)
MCHC: 32.4 g/dL (ref 31.5–36.0)
MCV: 95 fL (ref 79.5–101.0)
MONOS PCT: 9 %
Monocytes Absolute: 0.7 10*3/uL (ref 0.1–0.9)
NEUTROS PCT: 62 %
Neutro Abs: 5 10*3/uL (ref 1.5–6.5)
Platelets: 219 10*3/uL (ref 145–400)
RBC: 3.8 MIL/uL (ref 3.70–5.45)
RDW: 15.9 % — ABNORMAL HIGH (ref 11.2–14.5)
WBC: 8 10*3/uL (ref 3.9–10.3)

## 2017-05-10 MED ORDER — ACETAMINOPHEN 325 MG PO TABS
650.0000 mg | ORAL_TABLET | Freq: Once | ORAL | Status: AC
  Administered 2017-05-10: 650 mg via ORAL

## 2017-05-10 MED ORDER — SODIUM CHLORIDE 0.9% FLUSH
10.0000 mL | INTRAVENOUS | Status: DC | PRN
  Administered 2017-05-10: 10 mL
  Filled 2017-05-10: qty 10

## 2017-05-10 MED ORDER — SODIUM CHLORIDE 0.9 % IV SOLN
Freq: Once | INTRAVENOUS | Status: AC
  Administered 2017-05-10: 10:00:00 via INTRAVENOUS

## 2017-05-10 MED ORDER — HEPARIN SOD (PORK) LOCK FLUSH 100 UNIT/ML IV SOLN
500.0000 [IU] | Freq: Once | INTRAVENOUS | Status: AC | PRN
  Administered 2017-05-10: 500 [IU]
  Filled 2017-05-10: qty 5

## 2017-05-10 MED ORDER — SODIUM CHLORIDE 0.9 % IV SOLN
600.0000 mg | Freq: Once | INTRAVENOUS | Status: AC
  Administered 2017-05-10: 600 mg via INTRAVENOUS
  Filled 2017-05-10: qty 28.57

## 2017-05-10 MED ORDER — PERTUZUMAB CHEMO INJECTION 420 MG/14ML
420.0000 mg | Freq: Once | INTRAVENOUS | Status: AC
  Administered 2017-05-10: 420 mg via INTRAVENOUS
  Filled 2017-05-10: qty 14

## 2017-05-10 MED ORDER — ACETAMINOPHEN 325 MG PO TABS
ORAL_TABLET | ORAL | Status: AC
  Filled 2017-05-10: qty 2

## 2017-05-10 MED ORDER — POTASSIUM CHLORIDE CRYS ER 20 MEQ PO TBCR: 20.0000 meq | EXTENDED_RELEASE_TABLET | Freq: Two times a day (BID) | ORAL | 2 refills | Status: DC

## 2017-05-10 NOTE — Patient Instructions (Signed)
[facility] Discharge Instructions for Patients Receiving Chemotherapy  Today you received the following chemotherapy agents: Trastuzumab (Herceptin) and Pertuzumab (Perjeta)  To help prevent nausea and vomiting after your treatment, we encourage you to take your nausea medication  as prescribed.    If you develop nausea and vomiting that is not controlled by your nausea medication, call the clinic.   BELOW ARE SYMPTOMS THAT SHOULD BE REPORTED IMMEDIATELY:  *FEVER GREATER THAN 100.5 F  *CHILLS WITH OR WITHOUT FEVER  NAUSEA AND VOMITING THAT IS NOT CONTROLLED WITH YOUR NAUSEA MEDICATION  *UNUSUAL SHORTNESS OF BREATH  *UNUSUAL BRUISING OR BLEEDING  TENDERNESS IN MOUTH AND THROAT WITH OR WITHOUT PRESENCE OF ULCERS  *URINARY PROBLEMS  *BOWEL PROBLEMS  UNUSUAL RASH Items with * indicate a potential emergency and should be followed up as soon as possible.  Feel free to call the clinic should you have any questions or concerns. The clinic phone number is [phone].  Please show the CHEMO ALERT CARD at check-in to the Emergency Department and triage nurse.   

## 2017-05-10 NOTE — Progress Notes (Signed)
[place]  [phone] [phone]  Clinic Follow Up Note   Patient Care Team: Orpah Melter, MD as PCP - General (Family Medicine) [name], MD as Consulting Physician (General Surgery) Truitt Merle, MD as Consulting Physician (Hematology) Gery Pray, MD as Consulting Physician (Radiation Oncology) Ladene Artist, MD as Consulting Physician (Gastroenterology)   Date of Service:  05/10/2017   CHIEF COMPLAINTS:  Follow up left breast cancer  Oncology History   Cancer Staging Breast cancer of upper-outer quadrant of left female breast [facility]) Staging form: Breast, AJCC 8th Edition - Clinical stage from 04/25/2016: Stage IIA (cT2, cN0, cM0, G3, ER: Negative, PR: Negative, HER2: Positive) - Signed by Truitt Merle, MD on 05/02/2016 - Pathologic stage from 10/09/2016: No Stage Recommended (ypT2, pN1a, cM0, G3, ER: Negative, PR: Negative, HER2: Positive) - Signed by Truitt Merle, MD on 10/26/2016       Breast cancer of upper-outer quadrant of left female breast ([place])   04/24/2016 Mammogram    Category B breasts with a new irregular mass in the UOQ left breast middle depth. Ultrasound revealed a 3.9 cm mass in the 1:00 position. The left axilla was negative.       04/25/2016 Initial Biopsy    Biopsy of the left breast showed grade 3 invasive ductal carcinoma, DCIS, and lymphovascular invasion was present.      04/25/2016 Receptors her2    ER 0% negative, PR 0% negative, HER2 positive, Ki67 30%      05/02/2016 Initial Diagnosis    Breast cancer of upper-outer quadrant of left female breast ([place])      05/07/2016 Imaging    MRI of the bilateral breast 05/07/16 IMPRESSION: Lobulated enhancing mass (4.0 x 2.7 x 3.1 cm) in the upper-outer quadrant of the left breast corresponding with the recently diagnosed invasive mammary carcinoma. Linear enhancement extends 2.6 cm posterior to the mass worrisome for ductal carcinoma in-situ.      05/09/2016  Echocardiogram    Echo 05/09/16 -LF EF: 55-60%      05/11/2016 - 08/31/2016 Neo-Adjuvant Chemotherapy    Cycle 1 Docetaxel, Carboplatin, Herceptin and pejeta (TCHP), cycle 2 Docetaxel changed to Taxol, carbo dose reduced to AUC 4.5 (from 5) due to severe diarrhea and cytopenia, Perjeta held after cycle 2.   Due to poor toleration, [name] was held after cycle 4 and Taxol changed to weekly X4. Herceptin was continued during the above chemo.        05/13/2016 Imaging    CT Angio Chest PE IMPRESSION: No evidence of pulmonary emboli. Left breast mass consistent with the given clinical history. Stable left adrenal lesion likely representing a small adenoma.      05/18/2016 - 05/24/2016 Hospital Admission    Patient presented with nausea, vomiting, and diarrhea; admitted to hospital with Hyponatremia      06/07/2016 - 06/09/2016 Hospital Admission    Patient presents to hospital complaints of rectal bleeding and abdominal cramps when defacating      06/07/2016 Imaging    CT ABDOMEN PELVIS W CONTRAST  IMPRESSION: 1. Wall thickening of the descending and sigmoid colon consistent with an infectious or inflammatory colitis. 2. There is also a short segment of narrowing and possible wall thickening of the superior ascending colon which could reflect a constricting mass or, more likely, be an area of persistent colonic spasm. This could be further assessed with either colonoscopy or a barium enema after the current symptoms of colitis have resolved. 3. Small, subcentimeter, low-density liver  lesions which may all be benign. However, 3 these are not evident on prior CT. Liver metastatic disease possible. These could be further assessed with liver MRI with and without contrast. 4. Adrenal lesions which are stable, on the right and myelolipoma and on the left most likely an adenoma.      06/07/2016 Imaging    CT A/P IMPRESSION: 1. Wall thickening of the descending and sigmoid colon  consistent with an infectious or inflammatory colitis. 2. There is also a short segment of narrowing and possible wall thickening of the superior ascending colon which could reflect a constricting mass or, more likely, be an area of persistent colonic spasm. This could be further assessed with either colonoscopy or a barium enema after the current symptoms of colitis have resolved. 3. Small, subcentimeter, low-density liver lesions which may all be benign. However, 3 these are not evident on prior CT. Liver metastatic disease possible. These could be further assessed with liver MRI with and without contrast. 4. Adrenal lesions which are stable, on the right and myelolipoma and on the left most likely an adenoma.      06/07/2016 - 06/09/2016 Hospital Admission    Diarrhea and rectal Bleeding      07/19/2016 - 07/20/2016 Hospital Admission    Diarrhea and dehydration      08/09/2016 Mammogram    Mammogram 08/09/16 IMPRESSION:  The 3.1 cm x 2 cm x 1.5 cm irregular equal density mass in the left breast is consistent with the known carcinoma showing mammographic evidence of preoperative chemotherapy response.         08/09/2016 Imaging    [place] of left breast 08/09/16 IMPRESSION:  The 2.7 cm lobulated mass in the left breast is a known biopsy positive for malignancy. Surgical and oncology consult in progress.      08/16/2016 Imaging    MRI Abdomen W WO Contrast IMPRESSION: Tiny sub-cm hepatic cysts. No evidence of metastatic disease or other acute findings. Tiny benign left adrenal adenoma and right adrenal myelolipoma.      09/14/2016 -  Chemotherapy    Maintenance Herceptin every 3 weeks. Added Perjeta back on 10/26/16      09/17/2016 Imaging    MRI Breast Bilateral 09/17/16 IMPRESSION: Smaller left breast mass, now measuring 2.5 cm. No new or suspicious enhancement in either breast.  RECOMMENDATION: Treatment plan.      10/09/2016 Surgery    LEFT BREAST LUMPECTOMY WITH  RADIOACTIVE SEED AND L4EFT AXILLARY SENTINEL LYMPH NODE BIOPSY by Dr. Lucia Gaskins on 10/09/16      10/09/2016 Pathology Results    Diagnosis 10/09/16 1. Breast, lumpectomy, Left - INVASIVE DUCTAL CARCINOMA, GRADE 3, SPANNING 2.4 CM. - HIGH GRADE DUCTAL CARCINOMA IN SITU WITH NECROSIS. - RESECTION MARGINS ARE NEGATIVE FOR CARCINOMA. - BIOPSY SITE. - SEE ONCOLOGY TABLE. 2. Breast, excision, Left additional medial margin - FIBROCYSTIC CHANGE. - NO MALIGNANCY IDENTIFIED. 3. Lymph node, sentinel, biopsy, Left Axillary - METASTATIC CARCINOMA IN ONE OF ONE LYMPH NODES (1/1).        11/22/2016 - 01/10/2017 Radiation Therapy    Radiation with Dr. Sondra Come and complete on 01/10/17      11/22/2016 -  Chemotherapy    Xeloda With radiation she will be on low dose 4 tablets in the am and 3 tablets in the pm. Starting 11/22/16 and complete on 01/10/17.   Will continue Xeloda, 14 days on and 7 days off, 4 tablets BID starting 01/18/17       03/12/2017 Echocardiogram  ECHO 03/12/17 Impressions: - Normal LV size with EF 55-60%. Strain as above. Normal RV size   and systolic function. No significant valvular abnormalities.      04/18/2017 Mammogram    IMPRESSION:  New post treatment changes of the left breast. There is no mammographic evidence of malignancy. A follow-up mammogram in 12 months is recommended.        HISTORY OF PRESENTING ILLNESS (05/02/16):  Brandi Dickson 57 y.o. female is here because of a new diagnosis of left breast cancer. She is accompanied by her husband to our multidisciplinary breast clinic today.  The patient presented with a palpable left breast lump approximately 2 weeks ago. Bilateral diagnostic mammogram on 04/24/16 showed Category B breasts with a new irregular mass in the UOQ left breast middle depth. Ultrasound performed on 04/24/16 revealed a 3.9 cm mass in the 1:00 position. The left axilla was negative.  Biopsy of the left breast on 04/25/16 showed grade 3  invasive ductal carcinoma, DCIS, and lymphovascular invasion was present (ER 0% negative, PR 0% negative, HER2 positive, Ki67 30%).  She denies tenderness of the biopsied area. Reports minor dimpling. The patient is 8 weeks out from a left hip replacement. She had a right hip replacement 2010 years ago.  The patient and her husband present today in multidisciplinary breast clinic to discuss treatment options for the management of her disease.  The patient is taking Neurontin for neuropathy in her feet from diabetes. She has been diagnosed for diabetes for the past 4 years. She states she only has neuropathy at night and take 2 Neurontin at night and then has no symptoms.  GYN HISTORY  Menarchal: 11 LMP: Complete hysterectomy for uterine fibroids ~ 2009. She was bleeding too much with her last menstrual cycle lasting 6 weeks. No issues with menopause. Contraceptive: no  HRT: No GP: G1P0  CURRENT THERAPY:  1. Maintenance Herceptin every 3 weeks, started on 06/01/16. Add Perjeta back starting 10/26/16. 2. Xeloda With concurrent with 2076m in am and 1505mpm, started 11/22/16 and completed on 01/10/17.  3. Continue Xeloda 200045m12h, 14 days on and 7 days off,  starting 01/18/17 for 4 months. Dose reduced to 2000m49m and 1500mg83mfrom cycle 2 due to severe diarrhea. Plan to complete on 05/15/17   INTERVAL HISTORY:  Brandi Dickson for follow up. She presets to the infusion room today by herself. She reports she is doing well overall. She is still finishing Xeloda, she had to stop it for a tooth infection that she took oral antibiotics for. She reports she has about 3 bowel movements a day.   On review of systems, pt denies new pain, or any other complaints at this time. Pertinent positives are listed and detailed within the above HPI.   MEDICAL HISTORY:  Past Medical History:  Diagnosis Date  . Arthritis   . Asthma    triggered with EsteeMindi Curlingumes and cigarette smoke  .  Cancer (HCC) [place] Colitis   . Diabetes mellitus without complication (HCC) [place] History of radiation therapy 11/22/16-01/10/17   left breast 50.4 Gy in 28 fractions, axillary region 45 Gy in 25 fractions, lumpectomy cavity boost 10 Gy tin 5 fractions  . Hypertension   . Neuropathy     SURGICAL HISTORY: Past Surgical History:  Procedure Laterality Date  . ABDOMINAL HYSTERECTOMY    . BREAST LUMPECTOMY WITH RADIOACTIVE SEED AND SENTINEL LYMPH NODE BIOPSY Left 10/09/2016   Procedure:  LEFT BREAST LUMPECTOMY WITH RADIOACTIVE SEED AND L4EFT AXILLARY SENTINEL LYMPH NODE BIOPSY;  Surgeon: [name], MD;  Location: [place];  Service: General;  Laterality: Left;  . DILATION AND CURETTAGE OF UTERUS    . KNEE ARTHROSCOPY Left   . PORTACATH PLACEMENT Right 05/08/2016   Procedure: INSERTION PORT-A-CATH WITH [place];  Surgeon: [name], MD;  Location: [place];  Service: General;  Laterality: Right;  . TONSILLECTOMY    . TOTAL HIP ARTHROPLASTY Right   . TOTAL HIP ARTHROPLASTY Left 03/06/2016   Procedure: LEFT TOTAL HIP ARTHROPLASTY ANTERIOR APPROACH;  Surgeon: [name], MD;  Location: [facility];  Service: Orthopedics;  Laterality: Left;    SOCIAL HISTORY: Social History   Socioeconomic History  . Marital status: Married    Spouse name: Not on file  . Number of children: 0  . Years of education: Not on file  . Highest education level: Not on file  Occupational History  . Not on file  Social Needs  . Financial resource strain: Not on file  . Food insecurity:    Worry: Not on file    Inability: Not on file  . Transportation needs:    Medical: Not on file    Non-medical: Not on file  Tobacco Use  . Smoking status: Former Smoker    Packs/day: 1.00    Years: 29.00    Pack years: 29.00    Types: Cigarettes    Last attempt to quit: 10/27/2002    Years since quitting: 14.5  . Smokeless tobacco: Never Used  Substance and Sexual Activity  . Alcohol use: No     Alcohol/week: 0.0 oz  . Drug use: No  . Sexual activity: Yes    Birth control/protection: Surgical  Lifestyle  . Physical activity:    Days per week: Not on file    Minutes per session: Not on file  . Stress: Not on file  Relationships  . Social connections:    Talks on phone: Not on file    Gets together: Not on file    Attends religious service: Not on file    Active member of club or organization: Not on file    Attends meetings of clubs or organizations: Not on file    Relationship status: Not on file  . Intimate partner violence:    Fear of current or ex partner: Not on file    Emotionally abused: Not on file    Physically abused: Not on file    Forced sexual activity: Not on file  Other Topics Concern  . Not on file  Social History Narrative  . Not on file    FAMILY HISTORY: Family History  Problem Relation Age of Onset  . Colon cancer Father   . Stomach cancer Paternal Uncle   . Stomach cancer Paternal Uncle   . Melanoma Brother   . Thyroid cancer Brother   . Breast cancer Maternal Aunt   . Breast cancer Maternal Aunt   . Breast cancer Cousin     ALLERGIES:  is allergic to lisinopril; augmentin [amoxicillin-pot clavulanate]; benadryl [diphenhydramine]; and losartan potassium.  MEDICATIONS:  Current Outpatient Medications  Medication Sig Dispense Refill  . albuterol (PROVENTIL HFA;VENTOLIN HFA) 108 (90 Base) MCG/ACT inhaler Inhale 1-2 puffs into the lungs every 6 (six) hours as needed for wheezing or shortness of breath.    . capecitabine (XELODA) 500 MG tablet Take  4 tabs in the morning and 3 tab in evening, 2  weeks on and one week off 98 tablet 1  . diazepam (VALIUM) 5 MG tablet Take 1 tablet (5 mg total) by mouth as directed. Take 5 mg by mouth 30 minutes prior to MRI procedure.   May repeat x 1. DO NOT Drive after taking Valium as it may cause drowsiness. 2 tablet 0  . dicyclomine (BENTYL) 20 MG tablet Take 1 tablet (20 mg total) by mouth 4 (four)  times daily -  before meals and at bedtime. 30 tablet 0  . diphenoxylate-atropine (LOMOTIL) 2.5-0.025 MG tablet Take one to two tablets every six hours as needed for diarrhea 120 tablet 1  . ferrous sulfate 325 (65 FE) MG tablet Take 325 mg by mouth 2 (two) times daily with a meal.    . gabapentin (NEURONTIN) 100 MG capsule Take 200 mg by mouth at bedtime.     . Homeopathic Products (ZICAM ALLERGY RELIEF NA) Take 1 tablet by mouth daily.    [name] Kitchen HYDROcodone-acetaminophen (NORCO/VICODIN) 5-325 MG tablet Take 1-2 tablets by mouth every 6 (six) hours as needed for moderate pain. 20 tablet 0  . hydrocortisone (ANUSOL-HC) 25 MG suppository Place 1 suppository (25 mg total) rectally 2 (two) times daily. (Patient taking differently: Place 25 mg rectally as needed. ) 12 suppository 0  . lidocaine-prilocaine (EMLA) cream Apply 1 application to skin 1.5 to 2 hrs before use.  Cover to secure cream with plastic wrap. 30 g PRN  . loperamide (IMODIUM) 1 MG/5ML solution Take 3 mg by mouth as needed for diarrhea or loose stools.    [name] Kitchen LORazepam (ATIVAN) 0.5 MG tablet Take 1 tablet (0.5 mg total) by mouth once as needed for anxiety. 2 tablet 0  . magnesium oxide (MAG-OX) 400 MG tablet Take 400 mg by mouth daily.     . Mesalamine 800 MG TBEC Take 2 tablets (1,600 mg total) by mouth 2 (two) times daily. 120 tablet 0  . metFORMIN (GLUCOPHAGE-XR) 500 MG 24 hr tablet Take 1,000 mg by mouth at bedtime.    . metoprolol (LOPRESSOR) 50 MG tablet Take 50 mg by mouth 2 (two) times daily. Takes 1 in the am and 1 at HS    . niacin 250 MG tablet Take 250 mg by mouth at bedtime.     . ondansetron (ZOFRAN) 8 MG tablet Take 1 tablet (8 mg total) by mouth 2 (two) times daily as needed for refractory nausea / vomiting. Start on day 3 after chemo. 30 tablet 1  . potassium chloride SA (KLOR-CON M20) 20 MEQ tablet Take 1 tablet (20 mEq total) by mouth 2 (two) times daily. 60 tablet 2  . prochlorperazine (COMPAZINE) 10 MG tablet Take 1 tablet  (10 mg total) by mouth every 6 (six) hours as needed (Nausea or vomiting). 30 tablet 1  . traMADol (ULTRAM) 50 MG tablet Take 1-2 tablets (50-100 mg total) by mouth every 6 (six) hours as needed. 40 tablet 0  . triamterene-hydrochlorothiazide (MAXZIDE) 75-50 MG per tablet Take 0.5 tablets by mouth daily.      No current facility-administered medications for this visit.    Facility-Administered Medications Ordered in Other Visits  Medication Dose Route Frequency Provider Last Rate Last Dose  . heparin lock flush 100 unit/mL  500 Units Intracatheter Once PRN Truitt Merle, MD      . sodium chloride flush (NS) 0.9 % injection 10 mL  10 mL Intracatheter PRN Truitt Merle, MD      . sodium chloride flush (NS) 0.9 % injection 10  mL  10 mL Intracatheter PRN Truitt Merle, MD   10 mL at 08/24/16 1725  . sodium chloride flush (NS) 0.9 % injection 10 mL  10 mL Intracatheter PRN Truitt Merle, MD   10 mL at 11/16/16 1628  . sodium chloride flush (NS) 0.9 % injection 10 mL  10 mL Intracatheter PRN Truitt Merle, MD   10 mL at 05/10/17 1214   REVIEW OF SYSTEMS:   Constitutional: Denies fevers, chills or abnormal night sweats  Eyes: Denies blurriness of vision, double vision or watery eyes Ears, nose, mouth, throat, and face: Denies mucositis or sore throat Respiratory: Denies cough, dyspnea or wheezes Cardiovascular: Denies palpitation.  Gastrointestinal:  Denies heartburn. Denies nausea, vomiting. (+) mild diarrhea, manageable  Skin: Denies abnormal skin rashes Lymphatics: Denies new lymphadenopathy or easy bruising Neurological: (+) neuropathy in hands/feet, unchanged Behavioral/Psych: Mood is stable, no new changes  All other systems were reviewed with the patient and are negative.   PHYSICAL EXAMINATION:  ECOG PERFORMANCE STATUS: 1 - Symptomatic but completely ambulatory Blood pressure 123/69, heart rate 68, respirations 16, T 37.4, pulse ox 99% on room air GENERAL:alert, no distress and comfortable SKIN: skin  color, texture, turgor are normal, no rashes or significant lesions EYES: normal, conjunctiva are pink and non-injected, sclera clear OROPHARYNX:no exudate, no erythema and lips, buccal mucosa, and tongue normal  NECK: supple, thyroid normal size, non-tender, without nodularity LYMPH:  no palpable lymphadenopathy in the cervical, axillary or inguinal LUNGS: clear to auscultation and percussion with normal breathing effort HEART: regular rate & rhythm and no murmurs and no lower extremity edema ABDOMEN:abdomen soft, non-tender and normal bowel sounds Musculoskeletal:no cyanosis of digits and no clubbing  PSYCH: alert & oriented x 3 with fluent speech NEURO: no focal motor/sensory deficits BREAST: (+) s/p lumpectomy inc in UOQ of left breast, healing well, no discharge, palpable mass or scar. Skin hyperpigmentation on left breast from radiation, no skin ulcers or skin breakdown.   LABORATORY DATA:  I have reviewed the data as listed CBC Latest Ref Rng & Units 05/10/2017 04/19/2017 03/29/2017  WBC 3.9 - 10.3 K/uL 8.0 9.1 6.1  Hemoglobin 11.6 - 15.9 g/dL 11.7 11.5(L) 12.7  Hematocrit 34.8 - 46.6 % 36.1 34.6(L) 39.2  Platelets 145 - 400 K/uL 219 225 238   CMP Latest Ref Rng & Units 05/10/2017 04/19/2017 03/29/2017  Glucose 70 - 140 mg/dL 120 116 112  BUN 7 - 26 mg/dL 11 11 14   Creatinine 0.60 - 1.10 mg/dL 0.82 0.82 0.84  Sodium 136 - 145 mmol/L 139 139 139  Potassium 3.5 - 5.1 mmol/L 3.1(L) 3.3(L) 3.6  Chloride 98 - 109 mmol/L 104 102 103  CO2 22 - 29 mmol/L 27 27 24   Calcium 8.4 - 10.4 mg/dL 8.7 9.3 9.8  Total Protein 6.4 - 8.3 g/dL 6.4 6.7 7.2  Total Bilirubin 0.2 - 1.2 mg/dL 0.4 0.7 0.8  Alkaline Phos 40 - 150 U/L 77 90 95  AST 5 - 34 U/L 13 12 13   ALT 0 - 55 U/L 9 8 11    PATHOLOGY REPORT:    Diagnosis 10/09/16 1. Breast, lumpectomy, Left - INVASIVE DUCTAL CARCINOMA, GRADE 3, SPANNING 2.4 CM. - HIGH GRADE DUCTAL CARCINOMA IN SITU WITH NECROSIS. - RESECTION MARGINS ARE NEGATIVE FOR  CARCINOMA. - BIOPSY SITE. - SEE ONCOLOGY TABLE. 2. Breast, excision, Left additional medial margin - FIBROCYSTIC CHANGE. - NO MALIGNANCY IDENTIFIED. 3. Lymph node, sentinel, biopsy, Left Axillary - METASTATIC CARCINOMA IN ONE OF ONE LYMPH NODES (1/1).  Microscopic Comment 1. BREAST, STATUS POST NEOADJUVANT TREATMENT Procedure: Left lumpectomy with additional medial margin excision, left axillary sentinel lymph node biopsy. Laterality: Left. Tumor Size: 2.4 cm. Histologic Type: Invasive ductal carcinoma. Grade: 3 Tubular Differentiation: 3 Nuclear Pleomorphism: 3 Mitotic Count: 3 Ductal Carcinoma in Situ (DCIS): Present, high grade with necrosis. Regional Lymph Nodes: Number of Lymph Nodes Examined: 1 Number of Sentinel Lymph Nodes Examined: 1 Lymph Nodes with Macrometastases: 1 Lymph Nodes with Micrometastases: 0 Lymph Nodes with Isolated Tumor Cells: 0 Margins: Invasive carcinoma, distance from closest margin: >0.5 cm all original margins. DCIS, distance from closest margin: >0.5 cm all original margins. Extent of Tumor: Confined to breast parenchyma. Breast Prognostic Profile (pre-neoadjuvant case #: [id number], see below). Estrogen Receptor: Negative. Progesterone Receptor: Negative. Her2: Positive(ratio 2.25). Ki-67: 30% 2 of 4 FINAL for Neale, Elianys P ([id number]) Microscopic Comment(continued) Will be repeated on the current case (Block #: [id number]) and the results reported separately. Residual Cancer Burden (RCB): Primary Tumor Bed: 24 mm x 20 mm Overall Cancer Cellularity: 75% Percentage of Cancer that is in Situ: 5% Number of Positive Lymph Nodes: 1 Diameter of Largest Lymph Node metastasis: 3 mm Residual Cancer Burden : 3.439 Residual Cancer Burden Class: RCB-III Pathologic Stage Classification (p TNM, AJCC 8th Edition): Primary Tumor (ypT): ypT2 Regional Lymph Nodes (ypN): ypN1a 1. PROGNOSTIC INDICATORS Results: IMMUNOHISTOCHEMICAL AND MORPHOMETRIC ANALYSIS  PERFORMED MANUALLY Estrogen Receptor: 30%, POSITIVE, WEAK STAINING INTENSITY Progesterone Receptor: 0%, NEGATIVE COMMENT: The negative hormone receptor study(ies) in this case has an internal positive control. REFERENCE RANGE ESTROGEN RECEPTOR NEGATIVE 0% POSITIVE =>1% REFERENCE RANGE PROGESTERONE RECEPTOR NEGATIVE 0% POSITIVE =>1% All controls stained appropriately Enid Cutter MD Pathologist, Electronic Signature ( Signed 10/16/2016) 1. FLUORESCENCE IN-SITU HYBRIDIZATION Results: HER2 - **POSITIVE** RATIO OF HER2/CEP17 SIGNALS 2.67 AVERAGE HER2 COPY NUMBER PER CELL 4.00 Reference Range: NEGATIVE HER2/CEP17 Ratio <2.0 and average HER2 copy number <4.0 L DIAG NOSIS Diagnosis 04/25/16 Breast, left, needle core biopsy - INVASIVE DUCTAL CARCINOMA, SEE COMMENT. - DUCTAL CARCINOMA IN SITU. - LYMPHOVASCULAR INVASION PRESENT. Microscopic Comment The carcinoma appears grade 3 with focal squamous differentiation. Prognostic markers will be ordered. Dr. Lyndon Code has reviewed the case. The case was called to Dr. Isaiah Blakes on 04/26/2016. Vicente Males MD Pathologist, Electronic Signature (Case signed 04/26/2016) ADDITIONAL INFORMATION: 04/25/16 PROGNOSTIC INDICATORS Results: IMMUNOHISTOCHEMICAL AND MORPHOMETRIC ANALYSIS PERFORMED MANUALLY Estrogen Receptor: 0%, NEGATIVE Progesterone Receptor: 0%, NEGATIVE Proliferation Marker Ki67: 30% COMMENT: The negative hormone receptor study(ies) in this case has no internal positive control. REFERENCE RANGE ESTROGEN RECEPTOR NEGATIVE 0% POSITIVE =>1% REFERENCE RANGE PROGESTERONE RECEPTOR NEGATIVE 0% POSITIVE =>1% All controls stained appropriately Enid Cutter MD Pathologist, Electronic Signature ( Signed 05/01/2016) FLUORESCENCE IN-SITU HYBRIDIZATION Results: HER2 - **POSITIVE** RATIO OF HER2/CEP17 SIGNALS 2.25 AVERAGE HER2 COPY NUMBER PER CELL 8.45 1 of 3 FINAL for Boliver, Maleea P ([id number]) ADDITIONAL  INFORMATION:(continued) Reference Range: NEGATIVE HER2/CEP17 Ratio <2.0 and average HER2 copy number <4.0 EQUIVOCAL HER2/CEP17 Ratio <2.0 and average HER2 copy number 4.0 and <6.0 POSITIVE HER2/CEP17 Ratio >=2.0 or <2.0 and average HER2 copy number >=6.0 Enid Cutter MD Pathologist, Electronic Signature ( Signed 04/30/2016)   PROCEDURES  ECHO 03/12/17 Impressions: - Normal LV size with EF 55-60%. Strain as above. Normal RV size   and systolic function. No significant valvular abnormalities.  ECHO 11/21/16  Impressions: - Normal LV size and systolic function, EF 67-61%. Normal diastolic   function. Global longitudinal strain less negative than prior but   images not as clean. Normal RV size and systolic function.  Echo 05/09/16 Study Conclusions -LF EF: 55-60% - Left ventricle: The cavity size was normal. There was mild focal   basal hypertrophy of the septum. Indeterminant diastolic   function. Systolic function was normal. The estimated ejection   fraction was in the range of 55% to 60%. Wall motion was normal;   there were no regional wall motion abnormalities. GLS abnormal at   -13.3%, poor images however.   RADIOGRAPHIC STUDIES: I have personally reviewed the radiological images as listed and agreed with the findings in the report.   MRI Breast Bilateral 09/17/16 IMPRESSION: Smaller left breast mass, now measuring 2.5 cm. No new or suspicious enhancement in either breast. RECOMMENDATION: Treatment plan.  MRI Abdomen W WO Contrast 08/16/16 IMPRESSION: Tiny sub-cm hepatic cysts. No evidence of metastatic disease or other acute findings. Tiny benign left adrenal adenoma and right adrenal myelolipoma.  Mammogram 08/09/16 IMPRESSION:  The 3.1 cm x 2 cm x 1.5 cm irregular equal density mass in the left breast is consistent with the known carcinoma showing mammographic evidence of preoperative chemotherapy response.   [place] of left breast 08/09/16 IMPRESSION:  The 2.7 cm  lobulated mass in the left breast is a known biopsy positive for malignancy. Surgical and oncology consult in progress.   See onc history CT ABD/Pelvi 5/10/18s W CONTRAST 06/07/16 IMPRESSION: 1. Wall thickening of the descending and sigmoid colon consistent with an infectious or inflammatory colitis. 2. There is also a short segment of narrowing and possible wall thickening of the superior ascending colon which could reflect a constricting mass or, more likely, be an area of persistent colonic spasm. This could be further assessed with either colonoscopy or a barium enema after the current symptoms of colitis have resolved. 3. Small, subcentimeter, low-density liver lesions which may all be benign. However, 3 these are not evident on prior CT. Liver metastatic disease possible. These could be further assessed with liver MRI with and without contrast. 4. Adrenal lesions which are stable, on the right and myelolipoma and on the left most likely an adenoma.  CT Angio Chest PE W and/or wo Contrast 05/13/16 IMPRESSION: No evidence of pulmonary emboli.  ASSESSMENT & PLAN: 57 y.o. post-menopausal Caucasian female with a self palpated left breast mass.  1. Breast cancer of upper-outer quadrant of left breast, invasive ductal carcinoma,  stage IIA (cT2N0M0) grade 3, ER-, PR-, HER2 amplified, ypT2N1a -We previously reviewed the patient's imaging and pathology. -We previously reviewed her staging and biology of her breast cancer  -We previously discussed that surgical resection is the definitive treatment for breast cancer, she was seen by breast surgeon Dr. Lucia Gaskins today, lumpectomy versus mastectomy were discussed with patient. -We previously discussed HER2 positive breast cancers total approximately 15% of breast cancers and happens to be more aggressive than HER2 negative cancers, especially ER and PR negative disease, she has high likelihood of cancer recurrence after complete surgical resection.   -I recommended neoadjuvant chemotherapy TCHP (docetaxel, carboplatin, Herceptin and perjeta) every 3 weeks for 6 cycles, followed by maintenance Herceptin and perjeta to complete 1 year therapy -Her baseline echo was normal, she was seen by cardiologist Dr. Benjamine Mola -She unfortunately developed severe diarrhea and cytopenia after chemotherapy, and was hospitalized for several times.  -Chemotherapy was subsequently postponed and dose reduced, and stopped after 4 months treatment. -She underwent left breast lumpectomy on 10/09/16 and the pathology results show significant residual disease, RCB-III -Due to her residual cancer and positive lymph node I also recommend adjuvant chemo Xeloda for 6  months.  She started Xeloda with concurrent radiation, tolerating well so far.  We will continue, she will complete next week  -Due to her significant residual disease after neoadjuvant chemotherapy, I recommend her to continue Herceptin and perjeta maintenance therapy, to complete a total of one year therapy.  -Echo 11/21/16 LVEF 67-61%, normal systolic and diastolic function -She started adjuvant chemoradiation with Xeloda on 11/22/16 and completed on 01/10/17. She has experienced mild diarrhea with Xeloda, managed with imodium and lomotil. She has mild-moderate skin toxicity. OTC hydrocortisone topically to hands PRN to reduce inflammation was recommended.  -Due to severe diarrhea, her Xeloda dose was decreased from 2000 mg twice daily to 2031m in am and 15042min pm on 02/11/17.  -I previously recommended soaking her hands in warm water and vinegar to protect her skin from infection -I will refer her to survivorship clinic after her adjuvant chemo  -She had bronchitis late Feb - early March 2019, recovered well, treatment postponed for a week. Treatment also postponed late March - Early April 2019 due to tooth infection that she took oral antibiotics for -She will complete 8 cycles of adjuvant Xeloda on  05/15/17 -She is getting her last cycle of Herceptin and Perjeta today.  -I also discussed the option of Nerlynx with her today. That medication has more of a benefit for triple positive disease, she is ER/PR negative so her benefit would not be small. I discussed the side effects which includes diarrhea. She had diarrhea with previous chemo and Xeloda, and she has colitis, so I do not recommend this medication for her because side effects maybe outweight benefit. She voiced good understand.  -Survivorship clinic in 4-6 weeks  -Lab and f/u in 3 months   2. Genetics -Given her strong family history of breast cancer, we recommend her to see genetic counseling to ruled out inheritable breast cancer syndrome. She agreed. -Genetic counseling scheduled but she has not been seen, I referred her again  -She has no children. -She declined genetic testing at this time.  -She knows to continue cancer screenings.   3. Type 2 Diabetes mellitus, HTN -Managed by her PCP. -The patient has peripheral neuropathy in her feet from her diabetes. The patient is already on Neurontin with 200 mg at night. We previously discussed that chemotherapy may make her neuropathy worse. -Steroids will be given to reduce chemo side effects and I will reduce dexa to 59m65maily to not affect her blood sugar much. -We'll monitor her blood glucose and blood pressure closely during her chemotherapy treatment. -DM and HTN, controlled.  -Her ECHO from 03/12/17 revealed normal LV size with EF 55-60%. Strain as above. Normal RV size and systolic function. No significant valvular abnormalities.  4. Ulcerative colitis -We previously discussed that chemotherapy would cause diarrhea and the patient's colitis may exacerbate during chemo  -She is not taking steroids for this. She is on mesalamine, will follow up with Dr. StaFuller PlanShe had severe diarrhea after first cycle chemotherapy, was treated for ulcerative colitis flare -I previously  advised the patient to keep herself adequately hydrated and to take Imodium PRN. - I again previously advised the patient to take Lomotil and Imodium frequently as needed. -Since change in chemo her diarrhea has resolved.    5. Arthritis  -s/p b/l hip replacement, last surgery in February 2018.  -Tylenol every 6-8 hours as needed was recommended.  -I previously encouraged her to f/u with PCP for arthritic pain management   6. Peripheral Neuropathy,  secondary to chemotherapy -primarily typing and her neuropathy is worse following bouts of typing. I previously encouraged her to use her left hand more often and begin wearing cotton gloves.   -She is currently on Gabapentin for her baseline neuropathy associated w/ her DM, she currently takes this TID.  -She also began vitamin B12 in combination with her Gabapentin -She has worsening neuropathy with mild to moderate decreased vibratory sense in hands/feet that is worse at night -she takes 200 mg Neurontin at night, may increase to 300-400 mg at bedtime   7. Hypokalemia  secondary to chemotherapy  -10/26/16 labs showed a 3.0 potassium, she increased potassium to BID for the next 5 days then back to once daily   -She now takes potassium BID, K 3.2 on 12/14/16 -Her potassium remains at 3.3 on 01/11/17. She will continue potassium BID. I also suggest she increase potassium in her diet.  -She is doing well with her K supplement twice daily, 3.6 previously. I advised her to increase K if she experiences diarrhea.  -order lab today, she ran out of her supplement. I suspect her K may be low -K is 3.1 today (05/10/17) Refilled her K supplement, she is taking BID   PLAN -Continue last cycle Xeloda. She will complete adjuvant treatment on 05/15/17 -Last cycle of Herceptin and perjeta infusion today -Lab today, K is 3.1, refilled supplement, continue BID, may change to once daily if diarrhea much improves  -Survivorship clinic in 4-6 weeks  -Lab and  f/u in 3 months   No orders of the defined types were placed in this encounter.  All questions were answered. The patient knows to call the clinic with any problems, questions or concerns.  I spent 20 minutes counseling the patient face to face. The total time spent in the appointment was 25 minutes and more than 50% was on counseling.  This document serves as a record of services personally performed by Truitt Merle, MD. It was created on her behalf by Theresia Bough, a trained medical scribe. The creation of this record is based on the scribe's personal observations and the provider's statements to them.   I have reviewed the above documentation for accuracy and completeness, and I agree with the above.    Truitt Merle, MD 05/10/2017 2:34 PM

## 2017-05-10 NOTE — Telephone Encounter (Signed)
Patient walked in for appointment that was canceled. Infusion agreed to see her today. Per 4/12 walk in.

## 2017-05-11 ENCOUNTER — Encounter: Payer: Self-pay | Admitting: Hematology

## 2017-05-16 ENCOUNTER — Telehealth: Payer: Self-pay | Admitting: Hematology

## 2017-05-16 NOTE — Telephone Encounter (Signed)
Appointments scheduled per 4/13 sch msg

## 2017-05-17 ENCOUNTER — Ambulatory Visit: Payer: 59 | Admitting: Hematology

## 2017-05-17 ENCOUNTER — Other Ambulatory Visit: Payer: 59

## 2017-05-17 ENCOUNTER — Ambulatory Visit: Payer: 59

## 2017-05-20 NOTE — Telephone Encounter (Signed)
Error opening  

## 2017-05-31 ENCOUNTER — Other Ambulatory Visit: Payer: Self-pay

## 2017-05-31 ENCOUNTER — Inpatient Hospital Stay (HOSPITAL_COMMUNITY)
Admission: EM | Admit: 2017-05-31 | Discharge: 2017-06-03 | DRG: 387 | Disposition: A | Discharge status: Home / Self Care | Payer: 59 | Attending: Family Medicine | Admitting: Family Medicine

## 2017-05-31 ENCOUNTER — Encounter (HOSPITAL_COMMUNITY): Payer: Self-pay

## 2017-05-31 ENCOUNTER — Emergency Department (HOSPITAL_COMMUNITY): Payer: 59

## 2017-05-31 DIAGNOSIS — K51919 Ulcerative colitis, unspecified with unspecified complications: Secondary | ICD-10-CM | POA: Diagnosis present

## 2017-05-31 DIAGNOSIS — Z803 Family history of malignant neoplasm of breast: Secondary | ICD-10-CM

## 2017-05-31 DIAGNOSIS — E876 Hypokalemia: Secondary | ICD-10-CM | POA: Diagnosis not present

## 2017-05-31 DIAGNOSIS — M1990 Unspecified osteoarthritis, unspecified site: Secondary | ICD-10-CM | POA: Diagnosis present

## 2017-05-31 DIAGNOSIS — Z881 Allergy status to other antibiotic agents status: Secondary | ICD-10-CM | POA: Diagnosis not present

## 2017-05-31 DIAGNOSIS — Z87891 Personal history of nicotine dependence: Secondary | ICD-10-CM | POA: Diagnosis not present

## 2017-05-31 DIAGNOSIS — Z853 Personal history of malignant neoplasm of breast: Secondary | ICD-10-CM | POA: Diagnosis not present

## 2017-05-31 DIAGNOSIS — E1140 Type 2 diabetes mellitus with diabetic neuropathy, unspecified: Secondary | ICD-10-CM | POA: Diagnosis present

## 2017-05-31 DIAGNOSIS — J45909 Unspecified asthma, uncomplicated: Secondary | ICD-10-CM | POA: Diagnosis present

## 2017-05-31 DIAGNOSIS — Z800 Family history of malignant neoplasm of digestive organs: Secondary | ICD-10-CM

## 2017-05-31 DIAGNOSIS — Z90710 Acquired absence of both cervix and uterus: Secondary | ICD-10-CM | POA: Diagnosis not present

## 2017-05-31 DIAGNOSIS — K51911 Ulcerative colitis, unspecified with rectal bleeding: Secondary | ICD-10-CM | POA: Diagnosis not present

## 2017-05-31 DIAGNOSIS — Z808 Family history of malignant neoplasm of other organs or systems: Secondary | ICD-10-CM | POA: Diagnosis not present

## 2017-05-31 DIAGNOSIS — K922 Gastrointestinal hemorrhage, unspecified: Secondary | ICD-10-CM

## 2017-05-31 DIAGNOSIS — Z7984 Long term (current) use of oral hypoglycemic drugs: Secondary | ICD-10-CM

## 2017-05-31 DIAGNOSIS — I10 Essential (primary) hypertension: Secondary | ICD-10-CM | POA: Diagnosis present

## 2017-05-31 DIAGNOSIS — K625 Hemorrhage of anus and rectum: Secondary | ICD-10-CM | POA: Diagnosis not present

## 2017-05-31 DIAGNOSIS — Z923 Personal history of irradiation: Secondary | ICD-10-CM | POA: Diagnosis not present

## 2017-05-31 DIAGNOSIS — Z96643 Presence of artificial hip joint, bilateral: Secondary | ICD-10-CM | POA: Diagnosis present

## 2017-05-31 DIAGNOSIS — K51019 Ulcerative (chronic) pancolitis with unspecified complications: Secondary | ICD-10-CM | POA: Diagnosis not present

## 2017-05-31 DIAGNOSIS — C50412 Malignant neoplasm of upper-outer quadrant of left female breast: Secondary | ICD-10-CM | POA: Diagnosis not present

## 2017-05-31 DIAGNOSIS — R42 Dizziness and giddiness: Secondary | ICD-10-CM | POA: Diagnosis not present

## 2017-05-31 DIAGNOSIS — K529 Noninfective gastroenteritis and colitis, unspecified: Secondary | ICD-10-CM

## 2017-05-31 DIAGNOSIS — Z171 Estrogen receptor negative status [ER-]: Secondary | ICD-10-CM | POA: Diagnosis not present

## 2017-05-31 DIAGNOSIS — Z9221 Personal history of antineoplastic chemotherapy: Secondary | ICD-10-CM | POA: Diagnosis not present

## 2017-05-31 DIAGNOSIS — E860 Dehydration: Secondary | ICD-10-CM | POA: Diagnosis not present

## 2017-05-31 DIAGNOSIS — K51011 Ulcerative (chronic) pancolitis with rectal bleeding: Secondary | ICD-10-CM | POA: Diagnosis not present

## 2017-05-31 DIAGNOSIS — R197 Diarrhea, unspecified: Secondary | ICD-10-CM | POA: Diagnosis not present

## 2017-05-31 DIAGNOSIS — Z888 Allergy status to other drugs, medicaments and biological substances status: Secondary | ICD-10-CM | POA: Diagnosis not present

## 2017-05-31 DIAGNOSIS — D649 Anemia, unspecified: Secondary | ICD-10-CM | POA: Diagnosis not present

## 2017-05-31 DIAGNOSIS — A0472 Enterocolitis due to Clostridium difficile, not specified as recurrent: Secondary | ICD-10-CM | POA: Diagnosis not present

## 2017-05-31 DIAGNOSIS — R109 Unspecified abdominal pain: Secondary | ICD-10-CM | POA: Diagnosis not present

## 2017-05-31 LAB — CBC
HCT: 37.4 % (ref 36.0–46.0)
HCT: 34.8 % — ABNORMAL LOW (ref 36.0–46.0)
HEMOGLOBIN: 11.3 g/dL — AB (ref 12.0–15.0)
Hemoglobin: 11.9 g/dL — ABNORMAL LOW (ref 12.0–15.0)
MCH: 29.5 pg (ref 26.0–34.0)
MCH: 29.8 pg (ref 26.0–34.0)
MCHC: 31.8 g/dL (ref 30.0–36.0)
MCHC: 32.5 g/dL (ref 30.0–36.0)
MCV: 92.6 fL (ref 78.0–100.0)
MCV: 91.8 fL (ref 78.0–100.0)
PLATELETS: 210 10*3/uL (ref 150–400)
PLATELETS: 202 10*3/uL (ref 150–400)
RBC: 4.04 MIL/uL (ref 3.87–5.11)
RBC: 3.79 MIL/uL — ABNORMAL LOW (ref 3.87–5.11)
RDW: 15.6 % — AB (ref 11.5–15.5)
RDW: 15.7 % — ABNORMAL HIGH (ref 11.5–15.5)
WBC: 12.1 10*3/uL — ABNORMAL HIGH (ref 4.0–10.5)
WBC: 18.6 10*3/uL — ABNORMAL HIGH (ref 4.0–10.5)

## 2017-05-31 LAB — COMPREHENSIVE METABOLIC PANEL
ALBUMIN: 2.9 g/dL — AB (ref 3.5–5.0)
ALK PHOS: 80 U/L (ref 38–126)
ALT: 12 U/L — AB (ref 14–54)
ANION GAP: 11 (ref 5–15)
AST: 22 U/L (ref 15–41)
BILIRUBIN TOTAL: 0.6 mg/dL (ref 0.3–1.2)
BUN: 15 mg/dL (ref 6–20)
CALCIUM: 8.5 mg/dL — AB (ref 8.9–10.3)
CO2: 21 mmol/L — AB (ref 22–32)
CREATININE: 0.97 mg/dL (ref 0.44–1.00)
Chloride: 107 mmol/L (ref 101–111)
GFR calc Af Amer: >60 mL/min (ref >60)
GFR calc non Af Amer: >60 mL/min (ref >60)
GLUCOSE: 153 mg/dL — AB (ref 65–99)
Potassium: 3.3 mmol/L — ABNORMAL LOW (ref 3.5–5.1)
SODIUM: 139 mmol/L (ref 135–145)
TOTAL PROTEIN: 6.5 g/dL (ref 6.5–8.1)

## 2017-05-31 LAB — TYPE AND SCREEN
ABO/RH(D): O POS
ANTIBODY SCREEN: NEGATIVE

## 2017-05-31 LAB — GLUCOSE, CAPILLARY: Glucose-Capillary: 157 mg/dL — ABNORMAL HIGH (ref 65–99)

## 2017-05-31 LAB — MAGNESIUM: Magnesium: 1.3 mg/dL — ABNORMAL LOW (ref 1.7–2.4)

## 2017-05-31 MED ORDER — ONDANSETRON HCL 4 MG PO TABS: 4.0000 mg | ORAL_TABLET | Freq: Four times a day (QID) | ORAL | Status: DC | PRN

## 2017-05-31 MED ORDER — MAGNESIUM OXIDE 400 (241.3 MG) MG PO TABS
400.0000 mg | ORAL_TABLET | Freq: Every day | ORAL | Status: DC
  Administered 2017-06-01 – 2017-06-03 (×3): 400 mg via ORAL
  Filled 2017-05-31 (×3): qty 1

## 2017-05-31 MED ORDER — MAGNESIUM SULFATE 2 GM/50ML IV SOLN
2.0000 g | Freq: Once | INTRAVENOUS | Status: AC
  Administered 2017-05-31: 2 g via INTRAVENOUS
  Filled 2017-05-31: qty 50

## 2017-05-31 MED ORDER — GABAPENTIN 100 MG PO CAPS
200.0000 mg | ORAL_CAPSULE | Freq: Every day | ORAL | Status: DC
  Administered 2017-06-01 – 2017-06-02 (×3): 200 mg via ORAL
  Filled 2017-05-31 (×3): qty 2

## 2017-05-31 MED ORDER — IOPAMIDOL (ISOVUE-300) INJECTION 61%
100.0000 mL | Freq: Once | INTRAVENOUS | Status: AC | PRN
  Administered 2017-05-31: 100 mL via INTRAVENOUS

## 2017-05-31 MED ORDER — MESALAMINE 400 MG PO CPDR
1600.0000 mg | DELAYED_RELEASE_CAPSULE | Freq: Two times a day (BID) | ORAL | Status: DC
  Administered 2017-06-01 (×2): 1600 mg via ORAL
  Filled 2017-05-31 (×2): qty 4

## 2017-05-31 MED ORDER — SODIUM CHLORIDE 0.9 % IV BOLUS
1000.0000 mL | Freq: Once | INTRAVENOUS | Status: AC
  Administered 2017-05-31: 1000 mL via INTRAVENOUS

## 2017-05-31 MED ORDER — POTASSIUM CHLORIDE CRYS ER 20 MEQ PO TBCR
40.0000 meq | EXTENDED_RELEASE_TABLET | Freq: Once | ORAL | Status: AC
  Administered 2017-05-31: 40 meq via ORAL
  Filled 2017-05-31: qty 2

## 2017-05-31 MED ORDER — POTASSIUM CHLORIDE CRYS ER 10 MEQ PO TBCR
20.0000 meq | EXTENDED_RELEASE_TABLET | Freq: Two times a day (BID) | ORAL | Status: DC
  Administered 2017-06-01 – 2017-06-03 (×5): 20 meq via ORAL
  Filled 2017-05-31 (×5): qty 2

## 2017-05-31 MED ORDER — IOPAMIDOL (ISOVUE-300) INJECTION 61%
INTRAVENOUS | Status: AC
  Filled 2017-05-31: qty 100

## 2017-05-31 MED ORDER — INSULIN ASPART 100 UNIT/ML ~~LOC~~ SOLN
0.0000 [IU] | SUBCUTANEOUS | Status: DC
  Administered 2017-05-31 – 2017-06-01 (×3): 2 [IU] via SUBCUTANEOUS
  Administered 2017-06-01: 3 [IU] via SUBCUTANEOUS
  Administered 2017-06-01 – 2017-06-02 (×2): 2 [IU] via SUBCUTANEOUS
  Administered 2017-06-02 (×2): 1 [IU] via SUBCUTANEOUS
  Administered 2017-06-03: 2 [IU] via SUBCUTANEOUS

## 2017-05-31 MED ORDER — SODIUM CHLORIDE 0.9 % IV SOLN
INTRAVENOUS | Status: AC
  Administered 2017-05-31 – 2017-06-01 (×2): via INTRAVENOUS

## 2017-05-31 MED ORDER — POTASSIUM CHLORIDE 10 MEQ/100ML IV SOLN
10.0000 meq | INTRAVENOUS | Status: AC
  Administered 2017-05-31 – 2017-06-01 (×2): 10 meq via INTRAVENOUS
  Filled 2017-05-31 (×2): qty 100

## 2017-05-31 MED ORDER — ACETAMINOPHEN 650 MG RE SUPP: 650.0000 mg | Freq: Four times a day (QID) | RECTAL | Status: DC | PRN

## 2017-05-31 MED ORDER — METHYLPREDNISOLONE SODIUM SUCC 40 MG IJ SOLR: 40.0000 mg | INTRAMUSCULAR | Status: DC

## 2017-05-31 MED ORDER — ONDANSETRON HCL 4 MG/2ML IJ SOLN: 4.0000 mg | Freq: Four times a day (QID) | INTRAMUSCULAR | Status: DC | PRN

## 2017-05-31 MED ORDER — MAGNESIUM SULFATE 50 % IJ SOLN: 2.0000 g | Freq: Once | INTRAMUSCULAR | Status: DC

## 2017-05-31 MED ORDER — ACETAMINOPHEN 325 MG PO TABS: 650.0000 mg | ORAL_TABLET | Freq: Four times a day (QID) | ORAL | Status: DC | PRN

## 2017-05-31 MED ORDER — METHYLPREDNISOLONE SODIUM SUCC 40 MG IJ SOLR
40.0000 mg | Freq: Once | INTRAMUSCULAR | Status: AC
  Administered 2017-05-31: 40 mg via INTRAVENOUS
  Filled 2017-05-31: qty 1

## 2017-05-31 MED ORDER — SODIUM CHLORIDE 0.9 % IJ SOLN
INTRAMUSCULAR | Status: AC
  Filled 2017-05-31: qty 50

## 2017-05-31 NOTE — ED Notes (Signed)
ED TO INPATIENT HANDOFF REPORT  Name/Age/Gender [name] 57 y.o. female  Code Status Code Status History    Date Active Date Inactive Code Status Order ID Comments User Context   07/19/2016 1644 07/20/2016 1559 Full Code [id number]  Verlee Monte, MD Inpatient   06/07/2016 2150 06/09/2016 1515 Full Code [id number]  Phillips Grout, MD Inpatient   05/19/2016 0043 05/24/2016 1758 Full Code [id number]  Toy Baker, MD Inpatient   03/06/2016 1246 03/07/2016 1858 Full Code [id number]  Norman Herrlich Inpatient      Home/SNF/Other Home  Chief Complaint Ca pt, blue card, rectal bleeding  Level of Care/Admitting Diagnosis ED Disposition    ED Disposition Condition [place]: [facility] [[id number]]  Level of Care: Med-Surg [16]  Diagnosis: Ulcerative colitis with complication [facility]) [[id number]]  Admitting Physician: Toy Baker [[id number]]  Attending Physician: Toy Baker [[id number]]  Estimated length of stay: 3 - 4 days  Certification:: I certify this patient will need inpatient services for at least 2 midnights  Bed request comments: please avoid [place], patietn had  bad experience  PT Class (Do Not Modify): Inpatient [101]  PT Acc Code (Do Not Modify): Private [1]       Medical History Past Medical History:  Diagnosis Date  . Arthritis   . Asthma    triggered with [name] perfumes and cigarette smoke  . Cancer ([place])   . Colitis   . Diabetes mellitus without complication ([place])   . History of radiation therapy 11/22/16-01/10/17   left breast 50.4 Gy in 28 fractions, axillary region 45 Gy in 25 fractions, lumpectomy cavity boost 10 Gy tin 5 fractions  . Hypertension   . Neuropathy     Allergies Allergies  Allergen Reactions  . Lisinopril Palpitations  . Augmentin [Amoxicillin-Pot Clavulanate] Other (See Comments)    sts gives her a yeast infection Has patient had a PCN reaction causing immediate rash,  facial/tongue/throat swelling, SOB or lightheadedness with hypotension: no Has patient had a PCN reaction causing severe rash involving mucus membranes or skin necrosis: no Has patient had a PCN reaction that required hospitalization no Has patient had a PCN reaction occurring within the last 10 years: unknown If all of the above answers are "NO", then may proceed with Cephalosporin use.   . Benadryl [Diphenhydramine] Itching and Anxiety    Per pt: "Makes my skin crawl"; makes pt sensitive to touch  . Losartan Potassium Palpitations    IV Location/Drains/Wounds Patient Lines/Drains/Airways Status   Active Line/Drains/Airways    Name:   Placement date:   Placement time:   Site:   Days:   Implanted Port 05/11/16   05/11/16    -    -   385   Closed System Drain 1 Left;Lateral Breast Bulb (JP) 19 Fr.   10/09/16    1610    Breast   234   Incision (Closed) 03/06/16 Hip Left   03/06/16    0910     451   Incision (Closed) 05/08/16 Breast Right   05/08/16    1217     388   Incision (Closed) 10/09/16 Breast Left   10/09/16    1614     234          Labs/Imaging Results for orders placed or performed during the hospital encounter of 05/31/17 (from the past 48 hour(s))  Comprehensive metabolic panel     Status: Abnormal   Collection Time: 05/31/17  4:07 PM  Result Value Ref Range   Sodium 139 135 - 145 mmol/L   Potassium 3.3 (L) 3.5 - 5.1 mmol/L   Chloride 107 101 - 111 mmol/L   CO2 21 (L) 22 - 32 mmol/L   Glucose, Bld 153 (H) 65 - 99 mg/dL   BUN 15 6 - 20 mg/dL   Creatinine, Ser 0.97 0.44 - 1.00 mg/dL   Calcium 8.5 (L) 8.9 - 10.3 mg/dL   Total Protein 6.5 6.5 - 8.1 g/dL   Albumin 2.9 (L) 3.5 - 5.0 g/dL   AST 22 15 - 41 U/L   ALT 12 (L) 14 - 54 U/L   Alkaline Phosphatase 80 38 - 126 U/L   Total Bilirubin 0.6 0.3 - 1.2 mg/dL   GFR calc non Af Amer >60 >60 mL/min   GFR calc Af Amer >60 >60 mL/min    Comment: (NOTE) The eGFR has been calculated using the CKD EPI equation. This  calculation has not been validated in all clinical situations. eGFR's persistently <60 mL/min signify possible Chronic Kidney Disease.    Anion gap 11 5 - 15    Comment: Performed at [facility], [place] [address], [place], [place] [address]  CBC     Status: Abnormal   Collection Time: 05/31/17  4:07 PM  Result Value Ref Range   WBC 12.1 (H) 4.0 - 10.5 K/uL   RBC 4.04 3.87 - 5.11 MIL/uL   Hemoglobin 11.9 (L) 12.0 - 15.0 g/dL   HCT 37.4 36.0 - 46.0 %   MCV 92.6 78.0 - 100.0 fL   MCH 29.5 26.0 - 34.0 pg   MCHC 31.8 30.0 - 36.0 g/dL   RDW 15.6 (H) 11.5 - 15.5 %   Platelets 210 150 - 400 K/uL    Comment: Performed at [facility], [place] [address] [place], [place] [address]  Type and screen [place]     Status: None   Collection Time: 05/31/17  4:07 PM  Result Value Ref Range   ABO/RH(D) O POS    Antibody Screen NEG    Sample Expiration      06/03/2017 Performed at [facility], [place] [address], [place], [place] [address]   Magnesium     Status: Abnormal   Collection Time: 05/31/17  4:07 PM  Result Value Ref Range   Magnesium 1.3 (L) 1.7 - 2.4 mg/dL    Comment: Performed at [facility], [place] [address], [place], [place] [address]   Ct Abdomen Pelvis W Contrast  Result Date: 05/31/2017 CLINICAL DATA:  Lower abdominal pain, diarrhea x2 weeks EXAM: CT ABDOMEN AND PELVIS WITH CONTRAST TECHNIQUE: Multidetector CT imaging of the abdomen and pelvis was performed using the standard protocol following bolus administration of intravenous contrast. CONTRAST:  137m ISOVUE-300 IOPAMIDOL (ISOVUE-300) INJECTION 61% COMPARISON:  MRI abdomen dated 08/15/2016. CT abdomen/pelvis dated 06/07/2016. FINDINGS: Lower chest: Lung bases are essentially clear. Hepatobiliary: Liver is within normal limits. Gallbladder is unremarkable. No intrahepatic or extrahepatic duct dilatation. Pancreas: Within normal limits.  Spleen: Within normal limits. Adrenals/Urinary Tract: Adrenal glands within normal limits. Kidneys are within normal limits, noting an 8 mm left lower pole renal cyst. No hydronephrosis. Bladder is partially obscured by streak artifact but grossly unremarkable. Stomach/Bowel: Stomach is within normal limits. No evidence of bowel obstruction. Normal appendix (series 2/image 37). Tubular/ahaustral appearance of the descending/sigmoid colon with pericolonic inflammatory changes and vascular engorgement. Wall thickening involving the rectum (series 2/image  80). This appearance suggests infectious inflammatory colitis such as ulcerative colitis. Vascular/Lymphatic: No evidence of abdominal aortic aneurysm. No suspicious abdominopelvic lymphadenopathy. Reproductive: Status post hysterectomy. No adnexal masses. Other: No abdominopelvic ascites. Musculoskeletal: Mild degenerative changes of the visualized thoracolumbar spine. Bilateral hip arthroplasties, without evidence of complication. IMPRESSION: Wall thickening/inflammatory changes involving the left colon, suggesting infectious/inflammatory colitis, possibly ulcerative colitis. No findings suspicious for metastatic disease in this patient with history of breast cancer. Electronically Signed   By: Julian Hy M.D.   On: 05/31/2017 17:44    Pending Labs Unresulted Labs (From admission, onward)   Start     Ordered   05/31/17 1649  C difficile quick scan w PCR reflex  (C Difficile quick screen w PCR reflex panel)  Once, for 24 hours,   R     05/31/17 1649   Signed and Held  Hemoglobin A1c  Tomorrow morning,   R    Comments:  To assess prior glycemic control    Signed and Held   Signed and Held  Magnesium  Tomorrow morning,   R    Comments:  Call MD if <1.5    Signed and Held   Signed and Held  Phosphorus  Tomorrow morning,   R     Signed and Held   Signed and Held  TSH  Once,   R    Comments:  Cancel if already done within 1 month and notify MD     Signed and Held   Signed and Held  Comprehensive metabolic panel  Once,   R    Comments:  Cal MD for K<3.5 or >5.0    Signed and Held   Signed and Held  CBC  Once,   R    Comments:  Call for hg <8.0    Signed and Held   Signed and Held  HIV antibody (Routine Testing)  Tomorrow morning,   R     Signed and Held   Signed and Held  CBC  Now then every 8 hours,   STAT    Comments:  Call MD if Hg <8, plt <100    Signed and Held      Vitals/Pain Today's Vitals   05/31/17 1837 05/31/17 1845 05/31/17 1922 05/31/17 1953  BP: 97/60 106/70  118/74  Pulse: 100 97  75  Resp: 18   17  Temp:      TempSrc:      SpO2: 98% 100%  100%  Weight:      Height:      PainSc:   4      Isolation Precautions Enteric precautions (UV disinfection)  Medications Medications  iopamidol (ISOVUE-300) 61 % injection (has no administration in time range)  sodium chloride 0.9 % injection (has no administration in time range)  magnesium sulfate IVPB 2 g 50 mL (has no administration in time range)  potassium chloride 10 mEq in 100 mL IVPB (has no administration in time range)  iopamidol (ISOVUE-300) 61 % injection 100 mL (100 mLs Intravenous Contrast Given 05/31/17 1710)  sodium chloride 0.9 % bolus 1,000 mL (0 mLs Intravenous Stopped 05/31/17 1900)  potassium chloride SA (K-DUR,KLOR-CON) CR tablet 40 mEq (40 mEq Oral Given 05/31/17 1925)  methylPREDNISolone sodium succinate (SOLU-MEDROL) 40 mg/mL injection 40 mg (40 mg Intravenous Given 05/31/17 2007)    Mobility walks

## 2017-05-31 NOTE — ED Triage Notes (Signed)
Patient c/o bilateral lower abdominal pain and diarrhea x 2 weeks. patient states she had some bleeding within that 2 week , but does not know what color it was . Today, the patient states she had bright red bleeding with some clots. Patient has a history of left breast cancer.

## 2017-05-31 NOTE — ED Notes (Signed)
Patient assisted to restroom, states she just needed to urinate. I stayed with patient until she was finished, patient stated she felt tired. Then she claimed she had to poop, and had what sounded like a liquid bowel movement and stated it was just a little it. Was unable to collect a sample. RN aware.

## 2017-05-31 NOTE — ED Notes (Signed)
Bed: WA10 Expected date:  Expected time:  Means of arrival:  Comments: Triage 1   

## 2017-05-31 NOTE — ED Provider Notes (Signed)
[facility] DEPT Provider Note   CSN: [id number] Arrival date & time: 05/31/17  1443     History   Chief Complaint Chief Complaint  Patient presents with  . Rectal Bleeding  . Abdominal Pain  . Cancer patient    HPI Brandi Dickson is a 57 y.o. female.  Patient is a 57 year old female with a history of ulcerative colitis, breast cancer, diabetes and hypertension who presents with diarrhea and rectal bleeding.  She states that she completed her chemotherapy on April 12.  She had a bad 2-week history of watery diarrhea.  She has associated cramping with the diarrhea but no other abdominal pain.  Today she had some intense cramping and felt like she had to have a bowel movement and that was a large amount of pure blood.  She had a small amount of rectal bleeding last night.  She is feeling dizzy and lightheaded.  She feels generally fatigued but she is not sure if that is from the recent chemotherapy.  She has some nausea but no vomiting.  No current abdominal pain.  No fevers.  She did have an ulcerative colitis flare 1 year ago which required hospitalization with similar symptoms to her current symptoms.  This resolved with IV fluids and oral steroids.     Past Medical History:  Diagnosis Date  . Arthritis   . Asthma    triggered with [name] perfumes and cigarette smoke  . Cancer ([place])   . Colitis   . Diabetes mellitus without complication ([place])   . History of radiation therapy 11/22/16-01/10/17   left breast 50.4 Gy in 28 fractions, axillary region 45 Gy in 25 fractions, lumpectomy cavity boost 10 Gy tin 5 fractions  . Hypertension   . Neuropathy     Patient Active Problem List   Diagnosis Date Noted  . Ulcerative colitis with rectal bleeding ([place]) 05/31/2017  . Lactic acidosis 07/19/2016  . Antineoplastic chemotherapy induced pancytopenia ([place])   . GIB (gastrointestinal bleeding) 06/07/2016  . Hypertension   . Diabetes mellitus  without complication ([place])   . Ulcerative colitis without complications ([place]) 27/78/2423  . Hematochezia   . Generalized abdominal pain   . Nausea vomiting and diarrhea   . Diarrhea of presumed infectious origin   . Hyponatremia 05/18/2016  . Hypokalemia 05/18/2016  . Hypomagnesemia 05/18/2016  . Dehydration 05/18/2016  . Blood in stool 05/18/2016  . Thrombocytopenia ([place]) 05/18/2016  . GI bleeding 05/18/2016  . Port catheter in place 05/11/2016  . Breast cancer of upper-outer quadrant of left female breast ([place]) 05/02/2016  . S/P left THA, AA 03/06/2016  . Rectal bleeding 07/14/2015  . LLQ abdominal pain 07/14/2015  . Special screening for malignant neoplasms, colon 07/14/2015  . Loss of weight 07/14/2015  . Family history of colon cancer 07/14/2015    Past Surgical History:  Procedure Laterality Date  . ABDOMINAL HYSTERECTOMY    . BREAST LUMPECTOMY WITH RADIOACTIVE SEED AND SENTINEL LYMPH NODE BIOPSY Left 10/09/2016   Procedure: LEFT BREAST LUMPECTOMY WITH RADIOACTIVE SEED AND L4EFT AXILLARY SENTINEL LYMPH NODE BIOPSY;  Surgeon: [name], MD;  Location: [facility];  Service: General;  Laterality: Left;  . DILATION AND CURETTAGE OF UTERUS    . KNEE ARTHROSCOPY Left   . PORTACATH PLACEMENT Right 05/08/2016   Procedure: INSERTION PORT-A-CATH WITH [place];  Surgeon: [name], MD;  Location: [facility];  Service: General;  Laterality: Right;  . TONSILLECTOMY    .  TOTAL HIP ARTHROPLASTY Right   . TOTAL HIP ARTHROPLASTY Left 03/06/2016   Procedure: LEFT TOTAL HIP ARTHROPLASTY ANTERIOR APPROACH;  Surgeon: [name], MD;  Location: [facility];  Service: Orthopedics;  Laterality: Left;     OB History   None      Home Medications    Prior to Admission medications   Medication Sig Start Date End Date Taking? Authorizing Provider  albuterol (PROVENTIL HFA;VENTOLIN HFA) 108 (90 Base) MCG/ACT inhaler Inhale 1-2 puffs into the lungs every 6 (six) hours  as needed for wheezing or shortness of breath.   Yes [provider]  dicyclomine (BENTYL) 20 MG tablet Take 1 tablet (20 mg total) by mouth 4 (four) times daily -  before meals and at bedtime. 06/29/16  Yes Owens Shark, NP  diphenoxylate-atropine (LOMOTIL) 2.5-0.025 MG tablet Take one to two tablets every six hours as needed for diarrhea 04/08/17  Yes Truitt Merle, MD  gabapentin (NEURONTIN) 100 MG capsule Take 200 mg by mouth at bedtime.    Yes [provider]  loperamide (IMODIUM A-D) 2 MG tablet Take 4 mg by mouth 4 (four) times daily as needed for diarrhea or loose stools.   Yes [provider]  magnesium oxide (MAG-OX) 400 MG tablet Take 400 mg by mouth daily.    Yes [provider]  Mesalamine 800 MG TBEC Take 2 tablets (1,600 mg total) by mouth 2 (two) times daily. 05/02/17  Yes Ladene Artist, MD  metFORMIN (GLUCOPHAGE-XR) 500 MG 24 hr tablet Take 1,000 mg by mouth at bedtime.   Yes [provider]  metoprolol (LOPRESSOR) 50 MG tablet Take 50 mg by mouth 2 (two) times daily. Takes 1 in the am and 1 at [facility]   Yes [provider]  niacin 250 MG tablet Take 250 mg by mouth at bedtime.    Yes [provider]  potassium chloride SA (KLOR-CON M20) 20 MEQ tablet Take 1 tablet (20 mEq total) by mouth 2 (two) times daily. 05/10/17  Yes Truitt Merle, MD  triamterene-hydrochlorothiazide (MAXZIDE) 75-50 MG per tablet Take 0.5 tablets by mouth daily.    Yes [provider]  capecitabine (XELODA) 500 MG tablet Take  4 tabs in the morning and 3 tab in evening, 2 weeks on and one week off Patient not taking: Reported on 05/31/2017 03/29/17   Truitt Merle, MD  diazepam (VALIUM) 5 MG tablet Take 1 tablet (5 mg total) by mouth as directed. Take 5 mg by mouth 30 minutes prior to MRI procedure.   May repeat x 1. DO NOT Drive after taking Valium as it may cause drowsiness. Patient not taking: Reported on 05/31/2017 09/04/16   Truitt Merle, MD    HYDROcodone-acetaminophen (NORCO/VICODIN) 5-325 MG tablet Take 1-2 tablets by mouth every 6 (six) hours as needed for moderate pain. Patient not taking: Reported on 05/31/2017 10/09/16   [name], MD  hydrocortisone (ANUSOL-HC) 25 MG suppository Place 1 suppository (25 mg total) rectally 2 (two) times daily. Patient not taking: Reported on 05/31/2017 05/24/16   Charlynne Cousins, MD  lidocaine-prilocaine (EMLA) cream Apply 1 application to skin 1.5 to 2 hrs before use.  Cover to secure cream with plastic wrap. Patient not taking: Reported on 05/31/2017 05/10/16   Truitt Merle, MD  LORazepam (ATIVAN) 0.5 MG tablet Take 1 tablet (0.5 mg total) by mouth once as needed for anxiety. Patient not taking: Reported on 05/31/2017 05/02/16   Truitt Merle, MD  ondansetron (ZOFRAN) 8 MG tablet  Take 1 tablet (8 mg total) by mouth 2 (two) times daily as needed for refractory nausea / vomiting. Start on day 3 after chemo. Patient not taking: Reported on 05/31/2017 05/02/16   Truitt Merle, MD  prochlorperazine (COMPAZINE) 10 MG tablet Take 1 tablet (10 mg total) by mouth every 6 (six) hours as needed (Nausea or vomiting). Patient not taking: Reported on 05/31/2017 05/02/16   Truitt Merle, MD  traMADol (ULTRAM) 50 MG tablet Take 1-2 tablets (50-100 mg total) by mouth every 6 (six) hours as needed. Patient not taking: Reported on 05/31/2017 03/06/16   Danae Orleans, PA-C    Family History Family History  Problem Relation Age of Onset  . Colon cancer Father   . Stomach cancer Paternal Uncle   . Stomach cancer Paternal Uncle   . Melanoma Brother   . Thyroid cancer Brother   . Breast cancer Maternal Aunt   . Breast cancer Maternal Aunt   . Breast cancer Cousin     Social History Social History   Tobacco Use  . Smoking status: Former Smoker    Packs/day: 1.00    Years: 29.00    Pack years: 29.00    Types: Cigarettes    Last attempt to quit: 10/27/2002    Years since quitting: 14.6  . Smokeless tobacco: Never Used  Substance  Use Topics  . Alcohol use: No    Alcohol/week: 0.0 oz  . Drug use: No     Allergies   Lisinopril; Augmentin [amoxicillin-pot clavulanate]; Benadryl [diphenhydramine]; and Losartan potassium   Review of Systems Review of Systems  Constitutional: Positive for fatigue. Negative for chills, diaphoresis and fever.  HENT: Negative for congestion, rhinorrhea and sneezing.   Eyes: Negative.   Respiratory: Negative for cough, chest tightness and shortness of breath.   Cardiovascular: Negative for chest pain and leg swelling.  Gastrointestinal: Positive for abdominal pain, blood in stool and nausea. Negative for diarrhea and vomiting.  Genitourinary: Negative for difficulty urinating, flank pain, frequency and hematuria.  Musculoskeletal: Negative for arthralgias and back pain.  Skin: Negative for rash.  Neurological: Positive for dizziness and light-headedness. Negative for speech difficulty, weakness, numbness and headaches.     Physical Exam Updated Vital Signs BP 97/60 (BP Location: Right Arm)   Pulse 100   Temp 97.8 F (36.6 C) (Oral)   Resp 18   Ht 5\' 4"  (1.626 m)   Wt 86.2 kg (190 lb)   SpO2 98%   BMI 32.61 kg/m   Physical Exam  Constitutional: She is oriented to person, place, and time. She appears well-developed and well-nourished.  HENT:  Head: Normocephalic and atraumatic.  Eyes: Pupils are equal, round, and reactive to light.  Neck: Normal range of motion. Neck supple.  Cardiovascular: Normal rate, regular rhythm and normal heart sounds.  Mild tachycardia  Pulmonary/Chest: Effort normal and breath sounds normal. No respiratory distress. She has no wheezes. She has no rales. She exhibits no tenderness.  Abdominal: Soft. Bowel sounds are normal. There is no tenderness. There is no rebound and no guarding.  Genitourinary:  Genitourinary Comments: Positive gross blood on rectal exam  Musculoskeletal: Normal range of motion. She exhibits no edema.  Lymphadenopathy:     She has no cervical adenopathy.  Neurological: She is alert and oriented to person, place, and time.  Skin: Skin is warm and dry. No rash noted.  Psychiatric: She has a normal mood and affect.     ED Treatments / Results  Labs (all labs ordered  are listed, but only abnormal results are displayed) Labs Reviewed  COMPREHENSIVE METABOLIC PANEL - Abnormal; Notable for the following components:      Result Value   Potassium 3.3 (*)    CO2 21 (*)    Glucose, Bld 153 (*)    Calcium 8.5 (*)    Albumin 2.9 (*)    ALT 12 (*)    All other components within normal limits  CBC - Abnormal; Notable for the following components:   WBC 12.1 (*)    Hemoglobin 11.9 (*)    RDW 15.6 (*)    All other components within normal limits  MAGNESIUM - Abnormal; Notable for the following components:   Magnesium 1.3 (*)    All other components within normal limits  C DIFFICILE QUICK SCREEN W PCR REFLEX  POC OCCULT BLOOD, ED  TYPE AND SCREEN    EKG None  Radiology Ct Abdomen Pelvis W Contrast  Result Date: 05/31/2017 CLINICAL DATA:  Lower abdominal pain, diarrhea x2 weeks EXAM: CT ABDOMEN AND PELVIS WITH CONTRAST TECHNIQUE: Multidetector CT imaging of the abdomen and pelvis was performed using the standard protocol following bolus administration of intravenous contrast. CONTRAST:  171mL ISOVUE-300 IOPAMIDOL (ISOVUE-300) INJECTION 61% COMPARISON:  MRI abdomen dated 08/15/2016. CT abdomen/pelvis dated 06/07/2016. FINDINGS: Lower chest: Lung bases are essentially clear. Hepatobiliary: Liver is within normal limits. Gallbladder is unremarkable. No intrahepatic or extrahepatic duct dilatation. Pancreas: Within normal limits. Spleen: Within normal limits. Adrenals/Urinary Tract: Adrenal glands within normal limits. Kidneys are within normal limits, noting an 8 mm left lower pole renal cyst. No hydronephrosis. Bladder is partially obscured by streak artifact but grossly unremarkable. Stomach/Bowel: Stomach is  within normal limits. No evidence of bowel obstruction. Normal appendix (series 2/image 37). Tubular/ahaustral appearance of the descending/sigmoid colon with pericolonic inflammatory changes and vascular engorgement. Wall thickening involving the rectum (series 2/image 80). This appearance suggests infectious inflammatory colitis such as ulcerative colitis. Vascular/Lymphatic: No evidence of abdominal aortic aneurysm. No suspicious abdominopelvic lymphadenopathy. Reproductive: Status post hysterectomy. No adnexal masses. Other: No abdominopelvic ascites. Musculoskeletal: Mild degenerative changes of the visualized thoracolumbar spine. Bilateral hip arthroplasties, without evidence of complication. IMPRESSION: Wall thickening/inflammatory changes involving the left colon, suggesting infectious/inflammatory colitis, possibly ulcerative colitis. No findings suspicious for metastatic disease in this patient with history of breast cancer. Electronically Signed   By: Julian Hy M.D.   On: 05/31/2017 17:44    Procedures Procedures (including critical care time)  Medications Ordered in ED Medications  iopamidol (ISOVUE-300) 61 % injection (has no administration in time range)  sodium chloride 0.9 % injection (has no administration in time range)  methylPREDNISolone sodium succinate (SOLU-MEDROL) 40 mg/mL injection 40 mg (has no administration in time range)  iopamidol (ISOVUE-300) 61 % injection 100 mL (100 mLs Intravenous Contrast Given 05/31/17 1710)  sodium chloride 0.9 % bolus 1,000 mL (0 mLs Intravenous Stopped 05/31/17 1900)  potassium chloride SA (K-DUR,KLOR-CON) CR tablet 40 mEq (40 mEq Oral Given 05/31/17 1925)     Initial Impression / Assessment and Plan / ED Course  I have reviewed the triage vital signs and the nursing notes.  Pertinent labs & imaging results that were available during my care of the patient were reviewed by me and considered in my medical decision making (see chart for  details).     Patient is a 57 year old female who presents with gross blood from her rectum.  She has had preceding symptoms of watery diarrhea for about 2 weeks.  She has no  concerning masses or other abnormal findings on her CT scan other than evidence of colitis.  C. difficile was sent and is pending although her symptoms sound more consistent with her prior flareup of ulcerative colitis.  Her hemoglobin is stable.  She is not currently on anticoagulants.  I spoke with Dr. Carlean Purl with [facility] who did recommend going ahead and starting IV steroids.  He recommends Solu-Medrol 40 mg once a day.  I did advise him that his C. difficile is pending.  The GI team will see the patient in the morning.  I spoke with Dr. Roel Cluck with the hospitalist service he will admit the patient for further treatment.  Final Clinical Impressions(s) / ED Diagnoses   Final diagnoses:  Lower GI bleed  Colitis    ED Discharge Orders    None       Malvin Johns, MD 05/31/17 1931

## 2017-05-31 NOTE — ED Notes (Signed)
Patient is very adamant about not going to 3rd floor, RN, myself*tech* and EDP Belfi aware that patient doesn't want to go to 3rd floor. Patient states "she had a bad experience and does not want to go to that floor, she refuses" EDP said to let the hospitalist know because they are the ones that actually assign the rooms.

## 2017-05-31 NOTE — H&P (Signed)
Brandi Dickson OIT:[id number] DOB: Jun 13, 1960 DOA: 05/31/2017     PCP: Orpah Melter, MD   Outpatient Specialists:   CARDS:   Dr. Aundra Dubin  Oncology   Dr. Helen Hashimoto Dr. Rica Koyanagi Surgery Lucia Gaskins Patient arrived to ER on 05/31/17 at 1443  Patient coming from   home Lives  With family    Chief Complaint:  Chief Complaint  Patient presents with  . Rectal Bleeding  . Abdominal Pain  . Cancer patient    HPI: Brandi Dickson is a 57 y.o. female with medical history significant of left Breast Ca status post lumpectomy, neoadjuvant treatment radiation therapy, DM 2, diabetic neuropathy, colitis, HTN    Presented with 2 weeks of abdominal pain, watery diarrhea associated with some blood in stool today she have had bright red blood per rectum with some blood clots Feeling tired overall unwell no fevers but some chills this afternoon. associated that reports crampy abdominal pain similar to prior episodes of ulcerative colitis.  Today she had some tenesmus and then followed by a large bloody bowel movement.  Some nausea but no associated vomiting no chest pain or shortness of breath last hospitalization for the same was about 1 year ago at that time symptoms improved with IV fluids and oral steroids she is not on any oral steroids on a regular basis  Last chemotherapy was April 12 Regarding pertinent Chronic problems: Breast cancer finished Xeloda 2 weeks ago  history of diabetes on metformin  LAst ECHO 03/12/17  EF 55-60% Recurrent hypokalemia and supplements History of ulcerative colitis followed by GI on mesalamine While in ER: IV fluid given some improvement of tachycardia Rectal exam significant for gross blood in the rectum Following Medications were ordered in ER: Medications  iopamidol (ISOVUE-300) 61 % injection (has no administration in time range)  sodium chloride 0.9 % injection (has no administration in time range)  sodium chloride 0.9 % bolus 1,000 mL (1,000 mLs  Intravenous New Bag/Given 05/31/17 1756)  potassium chloride SA (K-DUR,KLOR-CON) CR tablet 40 mEq (has no administration in time range)  iopamidol (ISOVUE-300) 61 % injection 100 mL (100 mLs Intravenous Contrast Given 05/31/17 1710)    Significant initial  Findings: Abnormal Labs Reviewed  COMPREHENSIVE METABOLIC PANEL - Abnormal; Notable for the following components:      Result Value   Potassium 3.3 (*)    CO2 21 (*)    Glucose, Bld 153 (*)    Calcium 8.5 (*)    Albumin 2.9 (*)    ALT 12 (*)    All other components within normal limits  CBC - Abnormal; Notable for the following components:   WBC 12.1 (*)    Hemoglobin 11.9 (*)    RDW 15.6 (*)    All other components within normal limits     Na 139  Cr   stable,   Lab Results  Component Value Date   CREATININE 0.97 05/31/2017   CREATININE 0.82 05/10/2017   CREATININE 0.82 04/19/2017      WBC  12.1  HG/HCT   stable,      Component Value Date/Time   HGB 11.9 (L) 05/31/2017 1607   HGB 12.5 01/18/2017 0853   HCT 37.4 05/31/2017 1607   HCT 38.2 01/18/2017 0853        UA not ordered   CTabd/pelvis - colitis   ECG: Not ordered     ED Triage Vitals  Enc Vitals Group     BP 05/31/17 1458 (!) 115/56  Pulse Rate 05/31/17 1458 (!) 106     Resp 05/31/17 1458 18     Temp 05/31/17 1458 97.8 F (36.6 C)     Temp Source 05/31/17 1458 Oral     SpO2 05/31/17 1458 100 %     Weight 05/31/17 1504 190 lb (86.2 kg)     Height 05/31/17 1504 5' 4"  (1.626 m)     Head Circumference --      Peak Flow --      Pain Score 05/31/17 1502 10     Pain Loc --      Pain Edu? --      Excl. in [place]? --   TMAX(24)@       Latest  Blood pressure 97/60, pulse 100, temperature 97.8 F (36.6 C), temperature source Oral, resp. rate 18, height 5' 4"  (1.626 m), weight 86.2 kg (190 lb), SpO2 98 %.    ER Provider Called: [facility]   Dr. Carlean Purl They Recommend Solu-Medrol 40 mg once a day  Will see in AM   Hospitalist was called for  admission for Possible Ulcerative Colitis flair   Review of Systems:    Pertinent positives include: fatigue, abdominal pain, nausea, blood in stool,   Constitutional:  No weight loss, night sweats, Fevers, chills, , weight loss  HEENT:  No headaches, Difficulty swallowing,Tooth/dental problems,Sore throat,  No sneezing, itching, ear ache, nasal congestion, post nasal drip,  Cardio-vascular:  No chest pain, Orthopnea, PND, anasarca, dizziness, palpitations.no Bilateral lower extremity swelling  GI:  No heartburn, indigestionvomiting, diarrhea, change in bowel habits, loss of appetite, melena, hematemesis Resp:  no shortness of breath at rest. No dyspnea on exertion, No excess mucus, no productive cough, No non-productive cough, No coughing up of blood.No change in color of mucus.No wheezing. Skin:  no rash or lesions. No jaundice GU:  no dysuria, change in color of urine, no urgency or frequency. No straining to urinate.  No flank pain.  Musculoskeletal:  No joint pain or no joint swelling. No decreased range of motion. No back pain.  Psych:  No change in mood or affect. No depression or anxiety. No memory loss.  Neuro: no localizing neurological complaints, no tingling, no weakness, no double vision, no gait abnormality, no slurred speech, no confusion  As per HPI otherwise 10 point review of systems negative.   Past Medical History:   Past Medical History:  Diagnosis Date  . Arthritis   . Asthma    triggered with [name] perfumes and cigarette smoke  . Cancer ([place])   . Colitis   . Diabetes mellitus without complication ([place])   . History of radiation therapy 11/22/16-01/10/17   left breast 50.4 Gy in 28 fractions, axillary region 45 Gy in 25 fractions, lumpectomy cavity boost 10 Gy tin 5 fractions  . Hypertension   . Neuropathy       Past Surgical History:  Procedure Laterality Date  . ABDOMINAL HYSTERECTOMY    . BREAST LUMPECTOMY WITH RADIOACTIVE SEED AND  SENTINEL LYMPH NODE BIOPSY Left 10/09/2016   Procedure: LEFT BREAST LUMPECTOMY WITH RADIOACTIVE SEED AND L4EFT AXILLARY SENTINEL LYMPH NODE BIOPSY;  Surgeon: [name], MD;  Location: [facility];  Service: General;  Laterality: Left;  . DILATION AND CURETTAGE OF UTERUS    . KNEE ARTHROSCOPY Left   . PORTACATH PLACEMENT Right 05/08/2016   Procedure: INSERTION PORT-A-CATH WITH [place];  Surgeon: [name], MD;  Location: [facility];  Service: General;  Laterality:  Right;  [name] Kitchen TONSILLECTOMY    . TOTAL HIP ARTHROPLASTY Right   . TOTAL HIP ARTHROPLASTY Left 03/06/2016   Procedure: LEFT TOTAL HIP ARTHROPLASTY ANTERIOR APPROACH;  Surgeon: [name], MD;  Location: [facility];  Service: Orthopedics;  Laterality: Left;    Social History:  Ambulatory   independently      reports that she quit smoking about 14 years ago. Her smoking use included cigarettes. She has a 29.00 pack-year smoking history. She has never used smokeless tobacco. She reports that she does not drink alcohol or use drugs.     Family History:   Family History  Problem Relation Age of Onset  . Colon cancer Father   . Stomach cancer Paternal Uncle   . Stomach cancer Paternal Uncle   . Melanoma Brother   . Thyroid cancer Brother   . Breast cancer Maternal Aunt   . Breast cancer Maternal Aunt   . Breast cancer Cousin     Allergies: Allergies  Allergen Reactions  . Lisinopril Palpitations  . Augmentin [Amoxicillin-Pot Clavulanate] Other (See Comments)    sts gives her a yeast infection Has patient had a PCN reaction causing immediate rash, facial/tongue/throat swelling, SOB or lightheadedness with hypotension: no Has patient had a PCN reaction causing severe rash involving mucus membranes or skin necrosis: no Has patient had a PCN reaction that required hospitalization no Has patient had a PCN reaction occurring within the last 10 years: unknown If all of the above answers are "NO", then may  proceed with Cephalosporin use.   . Benadryl [Diphenhydramine] Itching and Anxiety    Per pt: "Makes my skin crawl"; makes pt sensitive to touch  . Losartan Potassium Palpitations     Prior to Admission medications   Medication Sig Start Date End Date Taking? Authorizing Provider  albuterol (PROVENTIL HFA;VENTOLIN HFA) 108 (90 Base) MCG/ACT inhaler Inhale 1-2 puffs into the lungs every 6 (six) hours as needed for wheezing or shortness of breath.   Yes [provider]  dicyclomine (BENTYL) 20 MG tablet Take 1 tablet (20 mg total) by mouth 4 (four) times daily -  before meals and at bedtime. 06/29/16  Yes Owens Shark, NP  diphenoxylate-atropine (LOMOTIL) 2.5-0.025 MG tablet Take one to two tablets every six hours as needed for diarrhea 04/08/17  Yes Truitt Merle, MD  gabapentin (NEURONTIN) 100 MG capsule Take 200 mg by mouth at bedtime.    Yes [provider]  loperamide (IMODIUM A-D) 2 MG tablet Take 4 mg by mouth 4 (four) times daily as needed for diarrhea or loose stools.   Yes [provider]  magnesium oxide (MAG-OX) 400 MG tablet Take 400 mg by mouth daily.    Yes [provider]  Mesalamine 800 MG TBEC Take 2 tablets (1,600 mg total) by mouth 2 (two) times daily. 05/02/17  Yes Ladene Artist, MD  metFORMIN (GLUCOPHAGE-XR) 500 MG 24 hr tablet Take 1,000 mg by mouth at bedtime.   Yes [provider]  metoprolol (LOPRESSOR) 50 MG tablet Take 50 mg by mouth 2 (two) times daily. Takes 1 in the am and 1 at [facility]   Yes [provider]  niacin 250 MG tablet Take 250 mg by mouth at bedtime.    Yes [provider]  potassium chloride SA (KLOR-CON M20) 20 MEQ tablet Take 1 tablet (20 mEq total) by mouth 2 (two) times daily. 05/10/17  Yes Truitt Merle, MD  triamterene-hydrochlorothiazide (MAXZIDE) 75-50 MG per tablet Take 0.5 tablets  by mouth daily.    Yes [provider]  capecitabine (XELODA) 500 MG tablet Take  4 tabs in the morning and  3 tab in evening, 2 weeks on and one week off Patient not taking: Reported on 05/31/2017 03/29/17   Truitt Merle, MD  diazepam (VALIUM) 5 MG tablet Take 1 tablet (5 mg total) by mouth as directed. Take 5 mg by mouth 30 minutes prior to MRI procedure.   May repeat x 1. DO NOT Drive after taking Valium as it may cause drowsiness. Patient not taking: Reported on 05/31/2017 09/04/16   Truitt Merle, MD  HYDROcodone-acetaminophen (NORCO/VICODIN) 5-325 MG tablet Take 1-2 tablets by mouth every 6 (six) hours as needed for moderate pain. Patient not taking: Reported on 05/31/2017 10/09/16   [name], MD  hydrocortisone (ANUSOL-HC) 25 MG suppository Place 1 suppository (25 mg total) rectally 2 (two) times daily. Patient not taking: Reported on 05/31/2017 05/24/16   Charlynne Cousins, MD  lidocaine-prilocaine (EMLA) cream Apply 1 application to skin 1.5 to 2 hrs before use.  Cover to secure cream with plastic wrap. Patient not taking: Reported on 05/31/2017 05/10/16   Truitt Merle, MD  LORazepam (ATIVAN) 0.5 MG tablet Take 1 tablet (0.5 mg total) by mouth once as needed for anxiety. Patient not taking: Reported on 05/31/2017 05/02/16   Truitt Merle, MD  ondansetron (ZOFRAN) 8 MG tablet Take 1 tablet (8 mg total) by mouth 2 (two) times daily as needed for refractory nausea / vomiting. Start on day 3 after chemo. Patient not taking: Reported on 05/31/2017 05/02/16   Truitt Merle, MD  prochlorperazine (COMPAZINE) 10 MG tablet Take 1 tablet (10 mg total) by mouth every 6 (six) hours as needed (Nausea or vomiting). Patient not taking: Reported on 05/31/2017 05/02/16   Truitt Merle, MD  traMADol (ULTRAM) 50 MG tablet Take 1-2 tablets (50-100 mg total) by mouth every 6 (six) hours as needed. Patient not taking: Reported on 05/31/2017 03/06/16   Danae Orleans, PA-C   Physical Exam: Blood pressure 97/60, pulse 100, temperature 97.8 F (36.6 C), temperature source Oral, resp. rate 18, height 5' 4"  (1.626 m), weight 86.2 kg (190 lb), SpO2 98 %. 1.  General:  in No Acute distress   Chronically ill  -appearing 2. Psychological: Alert and   Oriented 3. Head/ENT:    Dry Mucous Membranes                          Head Non traumatic, neck supple                            Poor Dentition 4. SKIN:  decreased Skin turgor,  Skin clean Dry and intact no rash 5. Heart: Regular rate and rhythm no  Murmur, no Rub or gallop 6. Lungs no wheezes or crackles   7. Abdomen: Soft,  non-tender, Non distended obese  bowel sounds present 8. Lower extremities: no clubbing, cyanosis, or edema 9. Neurologically Grossly intact, moving all 4 extremities equally  10. MSK: Normal range of motion   LABS:     Recent Labs  Lab 05/31/17 1607  WBC 12.1*  HGB 11.9*  HCT 37.4  MCV 92.6  PLT 941   Basic Metabolic Panel: Recent Labs  Lab 05/31/17 1607  NA 139  K 3.3*  CL 107  CO2 21*  GLUCOSE 153*  BUN 15  CREATININE 0.97  CALCIUM 8.5*  Recent Labs  Lab 05/31/17 1607  AST 22  ALT 12*  ALKPHOS 80  BILITOT 0.6  PROT 6.5  ALBUMIN 2.9*   No results for input(s): LIPASE, AMYLASE in the last 168 hours. No results for input(s): AMMONIA in the last 168 hours.    HbA1C: No results for input(s): HGBA1C in the last 72 hours. CBG: No results for input(s): GLUCAP in the last 168 hours.    Urine analysis:    Component Value Date/Time   COLORURINE STRAW (A) 07/19/2016 1238   APPEARANCEUR CLEAR 07/19/2016 1238   LABSPEC 1.006 07/19/2016 1238   PHURINE 5.0 07/19/2016 1238   GLUCOSEU >=500 (A) 07/19/2016 1238   HGBUR NEGATIVE 07/19/2016 1238   BILIRUBINUR NEGATIVE 07/19/2016 1238   KETONESUR 5 (A) 07/19/2016 1238   PROTEINUR NEGATIVE 07/19/2016 1238   UROBILINOGEN 0.2 12/30/2008 1317   NITRITE POSITIVE (A) 07/19/2016 1238   LEUKOCYTESUR NEGATIVE 07/19/2016 1238       Cultures:    Component Value Date/Time   SDES URINE, RANDOM 07/19/2016 1238   SPECREQUEST NONE 07/19/2016 1238   CULT 30,000 COLONIES/mL ESCHERICHIA COLI (A)  07/19/2016 1238   REPTSTATUS 07/21/2016 FINAL 07/19/2016 1238     Radiological Exams on Admission: Ct Abdomen Pelvis W Contrast  Result Date: 05/31/2017 CLINICAL DATA:  Lower abdominal pain, diarrhea x2 weeks EXAM: CT ABDOMEN AND PELVIS WITH CONTRAST TECHNIQUE: Multidetector CT imaging of the abdomen and pelvis was performed using the standard protocol following bolus administration of intravenous contrast. CONTRAST:  148m ISOVUE-300 IOPAMIDOL (ISOVUE-300) INJECTION 61% COMPARISON:  MRI abdomen dated 08/15/2016. CT abdomen/pelvis dated 06/07/2016. FINDINGS: Lower chest: Lung bases are essentially clear. Hepatobiliary: Liver is within normal limits. Gallbladder is unremarkable. No intrahepatic or extrahepatic duct dilatation. Pancreas: Within normal limits. Spleen: Within normal limits. Adrenals/Urinary Tract: Adrenal glands within normal limits. Kidneys are within normal limits, noting an 8 mm left lower pole renal cyst. No hydronephrosis. Bladder is partially obscured by streak artifact but grossly unremarkable. Stomach/Bowel: Stomach is within normal limits. No evidence of bowel obstruction. Normal appendix (series 2/image 37). Tubular/ahaustral appearance of the descending/sigmoid colon with pericolonic inflammatory changes and vascular engorgement. Wall thickening involving the rectum (series 2/image 80). This appearance suggests infectious inflammatory colitis such as ulcerative colitis. Vascular/Lymphatic: No evidence of abdominal aortic aneurysm. No suspicious abdominopelvic lymphadenopathy. Reproductive: Status post hysterectomy. No adnexal masses. Other: No abdominopelvic ascites. Musculoskeletal: Mild degenerative changes of the visualized thoracolumbar spine. Bilateral hip arthroplasties, without evidence of complication. IMPRESSION: Wall thickening/inflammatory changes involving the left colon, suggesting infectious/inflammatory colitis, possibly ulcerative colitis. No findings suspicious for  metastatic disease in this patient with history of breast cancer. Electronically Signed   By: SJulian HyM.D.   On: 05/31/2017 17:44    Chart has been reviewed    Assessment/Plan  57y.o. female with medical history significant of left Breast Ca status post lumpectomy, neoadjuvant treatment radiation therapy, DM 2, diabetic neuropathy, colitis, HTN Admitted for bright red blood pre rectum likely ulcerative colitis flair.  Present on Admission:  . GI bleeding most likely secondary to ulcerative colitis exacerbation appreciate GI input will obtain serial CBC currently stable.  Most likely lower GI source . Hypertension-  given soft blood pressures we will hold home medications . Hypokalemia - - will replace and repeat in AM,  check magnesium level and replace as needed  . Ulcerative colitis with rectal bleeding (HCC) -patient GI consult will initiate steroids Solu-Medrol 40 mg a day for now continue mesalamine . Dehydration rehydrate  and monitor fluid status   DM 2 -  - Order Sensitive SSI   -  check TSH and HgA1C  - Hold by mouth medications  . Breast cancer of upper-outer quadrant of left female breast ([place]) - in remission, notified oncology patient has been admitted   Other plan as per orders.  DVT prophylaxis:  SCD     Code Status:  FULL CODE  as per patient   I had personally discussed CODE STATUS with patient and family  Family Communication:   Family  at  Bedside  plan of care was discussed with  Husband,   Disposition Plan:    To home once workup is complete and patient is stable                                               Consults called: [facility], emailed oncology  Admission status:  inpatient      Level of care    medical floor       Toy Baker 05/31/2017, 8:33 PM    [facility]  Pager [phone]   after 2 AM please page floor coverage PA If 7AM-7PM, please contact the day team taking care of the patient  [email or web address]  Password [email or web address]

## 2017-06-01 ENCOUNTER — Encounter (HOSPITAL_COMMUNITY): Payer: Self-pay | Admitting: Internal Medicine

## 2017-06-01 DIAGNOSIS — K51019 Ulcerative (chronic) pancolitis with unspecified complications: Secondary | ICD-10-CM

## 2017-06-01 DIAGNOSIS — E876 Hypokalemia: Secondary | ICD-10-CM

## 2017-06-01 DIAGNOSIS — K51911 Ulcerative colitis, unspecified with rectal bleeding: Principal | ICD-10-CM

## 2017-06-01 DIAGNOSIS — I10 Essential (primary) hypertension: Secondary | ICD-10-CM

## 2017-06-01 LAB — CBC
HCT: 33.4 % — ABNORMAL LOW (ref 36.0–46.0)
HCT: 32.1 % — ABNORMAL LOW (ref 36.0–46.0)
Hemoglobin: 10.7 g/dL — ABNORMAL LOW (ref 12.0–15.0)
Hemoglobin: 10.4 g/dL — ABNORMAL LOW (ref 12.0–15.0)
MCH: 29.4 pg (ref 26.0–34.0)
MCH: 29.9 pg (ref 26.0–34.0)
MCHC: 32 g/dL (ref 30.0–36.0)
MCHC: 32.4 g/dL (ref 30.0–36.0)
MCV: 91.8 fL (ref 78.0–100.0)
MCV: 92.2 fL (ref 78.0–100.0)
PLATELETS: 213 10*3/uL (ref 150–400)
PLATELETS: 208 10*3/uL (ref 150–400)
RBC: 3.64 MIL/uL — ABNORMAL LOW (ref 3.87–5.11)
RBC: 3.48 MIL/uL — AB (ref 3.87–5.11)
RDW: 15.7 % — AB (ref 11.5–15.5)
RDW: 15.7 % — ABNORMAL HIGH (ref 11.5–15.5)
WBC: 13.1 10*3/uL — AB (ref 4.0–10.5)
WBC: 12.7 10*3/uL — AB (ref 4.0–10.5)

## 2017-06-01 LAB — COMPREHENSIVE METABOLIC PANEL
ALK PHOS: 68 U/L (ref 38–126)
ALT: 13 U/L — AB (ref 14–54)
AST: 14 U/L — AB (ref 15–41)
Albumin: 2.6 g/dL — ABNORMAL LOW (ref 3.5–5.0)
Anion gap: 9 (ref 5–15)
BUN: 13 mg/dL (ref 6–20)
CALCIUM: 8.7 mg/dL — AB (ref 8.9–10.3)
CHLORIDE: 106 mmol/L (ref 101–111)
CO2: 23 mmol/L (ref 22–32)
CREATININE: 0.77 mg/dL (ref 0.44–1.00)
GFR calc Af Amer: >60 mL/min (ref >60)
Glucose, Bld: 177 mg/dL — ABNORMAL HIGH (ref 65–99)
Potassium: 3.8 mmol/L (ref 3.5–5.1)
Sodium: 138 mmol/L (ref 135–145)
Total Bilirubin: 0.6 mg/dL (ref 0.3–1.2)
Total Protein: 6.4 g/dL — ABNORMAL LOW (ref 6.5–8.1)

## 2017-06-01 LAB — C DIFFICILE QUICK SCREEN W PCR REFLEX
C DIFFICILE (CDIFF) TOXIN: NEGATIVE
C Diff antigen: POSITIVE — AB

## 2017-06-01 LAB — PHOSPHORUS: Phosphorus: 3.2 mg/dL (ref 2.5–4.6)

## 2017-06-01 LAB — GLUCOSE, CAPILLARY
Glucose-Capillary: 173 mg/dL — ABNORMAL HIGH (ref 65–99)
Glucose-Capillary: 152 mg/dL — ABNORMAL HIGH (ref 65–99)
Glucose-Capillary: 118 mg/dL — ABNORMAL HIGH (ref 65–99)
Glucose-Capillary: 166 mg/dL — ABNORMAL HIGH (ref 65–99)
Glucose-Capillary: 215 mg/dL — ABNORMAL HIGH (ref 65–99)

## 2017-06-01 LAB — MAGNESIUM: Magnesium: 2.1 mg/dL (ref 1.7–2.4)

## 2017-06-01 LAB — HEMOGLOBIN A1C
HEMOGLOBIN A1C: 5.9 % — AB (ref 4.8–5.6)
MEAN PLASMA GLUCOSE: 122.63 mg/dL

## 2017-06-01 LAB — HIV ANTIBODY (ROUTINE TESTING W REFLEX): HIV SCREEN 4TH GENERATION: NONREACTIVE

## 2017-06-01 LAB — TSH: TSH: 0.358 u[IU]/mL (ref 0.350–4.500)

## 2017-06-01 LAB — CLOSTRIDIUM DIFFICILE BY PCR, REFLEXED: CDIFFPCR: POSITIVE — AB

## 2017-06-01 MED ORDER — SODIUM CHLORIDE 0.9% FLUSH: 10.0000 mL | INTRAVENOUS | Status: DC | PRN

## 2017-06-01 MED ORDER — VANCOMYCIN 50 MG/ML ORAL SOLUTION
125.0000 mg | Freq: Four times a day (QID) | ORAL | Status: DC
Start: 2017-06-01 — End: 2017-06-03
  Administered 2017-06-01 – 2017-06-03 (×8): 125 mg via ORAL
  Filled 2017-06-01 (×9): qty 2.5

## 2017-06-01 MED ORDER — MESALAMINE 400 MG PO CPDR
1600.0000 mg | DELAYED_RELEASE_CAPSULE | Freq: Three times a day (TID) | ORAL | Status: DC
  Administered 2017-06-01 – 2017-06-03 (×5): 1600 mg via ORAL
  Filled 2017-06-01 (×8): qty 4

## 2017-06-01 MED ORDER — METHYLPREDNISOLONE SODIUM SUCC 40 MG IJ SOLR
40.0000 mg | INTRAMUSCULAR | Status: DC
  Administered 2017-06-01: 40 mg via INTRAVENOUS
  Filled 2017-06-01: qty 1

## 2017-06-01 MED ORDER — SODIUM CHLORIDE 0.9% FLUSH: 10.0000 mL | Freq: Two times a day (BID) | INTRAVENOUS | Status: DC

## 2017-06-01 NOTE — Progress Notes (Signed)
PROGRESS NOTE [facility]   Brandi Dickson   ALP:[id number] DOB: 09-30-1960  DOA: 05/31/2017 PCP: Brandi Melter, MD   Brief Narrative:  Brandi Dickson 56 year old female with medical history significant for left breast cancer status post lumpectomy, completed radiation and chemotherapy, diabetes mellitus type 2, exudative colitis, hypertension and neuropathy.  Patient presented to the emergency department complaining of abdominal pain, watery diarrhea and bloody stools.  Upon ED evaluation WBC up to 18, FOBT positive, tachycardic, low potassium and hemoglobin of 11.9. CT of the abdomen and pelvis shows colitis.  Patient was admitted with working diagnosis of ulcerative colitis flare, C. difficile work-up was sent which showed C. difficile antigen positive, C. difficile toxin negative and C. difficile toxigenic PCR positive.  Subjective: Patient seen and examined, reports significant improvement since yesterday, abdominal pain has almost disappeared.  Only one bowel movement today with scant blood.  No nausea or vomiting.  Diet has been advanced and she is tolerating well.  She remains afebrile.  Assessment & Plan: Colitis Likely secondary to UC flare, differential diagnosis include C. difficile antigen positive, C. difficile toxin negative and C. difficile toxigenic PCR positive.  I have discussed the case with ID, seems to be that she is colonized but not producing toxin, in view of other diagnosis producing the symptoms of bloody diarrhea and abdominal pain will hold on antibiotic therapy for now.  Patient is clinically improving with steroid treatment for UC.  GI recommendations appreciated.  Continue IV steroids.  Follow CBC and BMP.  Continue Asacol.  Advance diet as tolerated  Diabetes mellitus type 2 A1c 5.9 Continue SSI  Hypertension BP was soft upon admission, antihypertensive medications on hold.  Blood pressure improved after IV hydration.  Continue to monitor for now  and reintroduce antihypertensive medication if blood pressure rise.  Hypokalemia Likely from diarrhea, magnesium normal Continue to monitor  DVT prophylaxis: SCDs Code Status: Full code Family Communication: Husband at bedside Disposition Plan: Home in 1 to 2 days  Consultants:   GI  Procedures:   None  Antimicrobials:  None   Objective: Vitals:   05/31/17 1845 05/31/17 1953 05/31/17 2233 06/01/17 0407  BP: 106/70 118/74 97/61 (!) 108/59  Pulse: 97 75 78 69  Resp:  17 18 17   Temp:   98.2 F (36.8 C) 98.2 F (36.8 C)  TempSrc:   Oral Oral  SpO2: 100% 100% 98% 97%  Weight:   94.8 kg (209 lb 1.6 oz)   Height:   5' 4"  (1.626 m)     Intake/Output Summary (Last 24 hours) at 06/01/2017 1313 Last data filed at 05/31/2017 2341 Gross per 24 hour  Intake 1120 ml  Output 600 ml  Net 520 ml   Filed Weights   05/31/17 1504 05/31/17 2233  Weight: 86.2 kg (190 lb) 94.8 kg (209 lb 1.6 oz)    Examination:  General exam: NAD HEENT: AC/AT, PERRLA, OP moist and clear Respiratory system: Clear to auscultation. No wheezes,crackle or rhonchi Cardiovascular system: S1 & S2 heard, RRR. No JVD, murmurs, rubs or gallops Gastrointestinal system: Abdomen is nondistended, soft and mild tenderness diffusely. No organomegaly or masses felt. Normal bowel sounds heard. Central nervous system: Alert and oriented. No focal neurological deficits. Extremities: Bilateral trace edema Skin: No rashes, lesions or ulcers Psychiatry: Mood & affect appropriate.    Data Reviewed: I have personally reviewed following labs and imaging studies  CBC: Recent Labs  Lab 05/31/17 1607 05/31/17 2305 06/01/17 0538  WBC 12.1* 18.6*  13.1*  HGB 11.9* 11.3* 10.7*  HCT 37.4 34.8* 33.4*  MCV 92.6 91.8 91.8  PLT 210 202 157   Basic Metabolic Panel: Recent Labs  Lab 05/31/17 1607 06/01/17 0538  NA 139 138  K 3.3* 3.8  CL 107 106  CO2 21* 23  GLUCOSE 153* 177*  BUN 15 13  CREATININE 0.97 0.77    CALCIUM 8.5* 8.7*  MG 1.3* 2.1  PHOS  --  3.2   GFR: Estimated Creatinine Clearance: 85.6 mL/min (by C-G formula based on SCr of 0.77 mg/dL). Liver Function Tests: Recent Labs  Lab 05/31/17 1607 06/01/17 0538  AST 22 14*  ALT 12* 13*  ALKPHOS 80 68  BILITOT 0.6 0.6  PROT 6.5 6.4*  ALBUMIN 2.9* 2.6*   No results for input(s): LIPASE, AMYLASE in the last 168 hours. No results for input(s): AMMONIA in the last 168 hours. Coagulation Profile: No results for input(s): INR, PROTIME in the last 168 hours. Cardiac Enzymes: No results for input(s): CKTOTAL, CKMB, CKMBINDEX, TROPONINI in the last 168 hours. BNP (last 3 results) No results for input(s): PROBNP in the last 8760 hours. HbA1C: Recent Labs    06/01/17 0538  HGBA1C 5.9*   CBG: Recent Labs  Lab 05/31/17 2230 06/01/17 0358 06/01/17 0747 06/01/17 1207  GLUCAP 157* 173* 152* 118*   Lipid Profile: No results for input(s): CHOL, HDL, LDLCALC, TRIG, CHOLHDL, LDLDIRECT in the last 72 hours. Thyroid Function Tests: Recent Labs    06/01/17 0538  TSH 0.358   Anemia Panel: No results for input(s): VITAMINB12, FOLATE, FERRITIN, TIBC, IRON, RETICCTPCT in the last 72 hours. Sepsis Labs: No results for input(s): PROCALCITON, LATICACIDVEN in the last 168 hours.  Recent Results (from the past 240 hour(s))  C difficile quick scan w PCR reflex     Status: Abnormal   Collection Time: 06/01/17  3:30 AM  Result Value Ref Range Status   C Diff antigen POSITIVE (A) NEGATIVE Final   C Diff toxin NEGATIVE NEGATIVE Final   C Diff interpretation Results are indeterminate. See PCR results.  Final    Comment: Performed at [facility], [place] [address], [place], [place] [address]  C. Diff by PCR, Reflexed     Status: Abnormal   Collection Time: 06/01/17  3:30 AM  Result Value Ref Range Status   Toxigenic C. Difficile by PCR POSITIVE (A) NEGATIVE Final    Comment: Positive for toxigenic C. difficile with little  to no toxin production. Only treat if clinical presentation suggests symptomatic illness. Performed at [facility], [place] [address], [place], [place] [address]       Radiology Studies: Ct Abdomen Pelvis W Contrast  Result Date: 05/31/2017 CLINICAL DATA:  Lower abdominal pain, diarrhea x2 weeks EXAM: CT ABDOMEN AND PELVIS WITH CONTRAST TECHNIQUE: Multidetector CT imaging of the abdomen and pelvis was performed using the standard protocol following bolus administration of intravenous contrast. CONTRAST:  187m ISOVUE-300 IOPAMIDOL (ISOVUE-300) INJECTION 61% COMPARISON:  MRI abdomen dated 08/15/2016. CT abdomen/pelvis dated 06/07/2016. FINDINGS: Lower chest: Lung bases are essentially clear. Hepatobiliary: Liver is within normal limits. Gallbladder is unremarkable. No intrahepatic or extrahepatic duct dilatation. Pancreas: Within normal limits. Spleen: Within normal limits. Adrenals/Urinary Tract: Adrenal glands within normal limits. Kidneys are within normal limits, noting an 8 mm left lower pole renal cyst. No hydronephrosis. Bladder is partially obscured by streak artifact but grossly unremarkable. Stomach/Bowel: Stomach is within normal limits. No evidence of bowel obstruction. Normal appendix (series 2/image 37).  Tubular/ahaustral appearance of the descending/sigmoid colon with pericolonic inflammatory changes and vascular engorgement. Wall thickening involving the rectum (series 2/image 80). This appearance suggests infectious inflammatory colitis such as ulcerative colitis. Vascular/Lymphatic: No evidence of abdominal aortic aneurysm. No suspicious abdominopelvic lymphadenopathy. Reproductive: Status post hysterectomy. No adnexal masses. Other: No abdominopelvic ascites. Musculoskeletal: Mild degenerative changes of the visualized thoracolumbar spine. Bilateral hip arthroplasties, without evidence of complication. IMPRESSION: Wall thickening/inflammatory changes involving the left colon, suggesting  infectious/inflammatory colitis, possibly ulcerative colitis. No findings suspicious for metastatic disease in this patient with history of breast cancer. Electronically Signed   By: Julian Hy M.D.   On: 05/31/2017 17:44    Scheduled Meds: . gabapentin  200 mg Oral QHS  . insulin aspart  0-9 Units Subcutaneous Q4H  . magnesium oxide  400 mg Oral Daily  . Mesalamine  1,600 mg Oral BID  . methylPREDNISolone (SOLU-MEDROL) injection  40 mg Intravenous Q24H  . potassium chloride SA  20 mEq Oral BID  . sodium chloride flush  10-40 mL Intracatheter Q12H   Continuous Infusions:   LOS: 1 day    Time spent: Total of 25 minutes spent with pt, greater than 50% of which was spent in discussion of  treatment, counseling and coordination of care   Chipper Oman, MD Pager: Text Page via [email or web address]   If 7PM-7AM, please contact night-coverage [email or web address] 06/01/2017, 1:13 PM   Note - This record has been created using [employer]. Chart creation errors have been sought, but may not always have been located. Such creation errors do not reflect on the standard of medical care.

## 2017-06-01 NOTE — Consult Note (Addendum)
Referring Provider: Dr. Roel Cluck, [facility] Primary Care Physician:  Orpah Melter, MD Primary Gastroenterologist:  Dr. Fuller Plan  Reason for Consultation:  Diarrhea, rectal bleeding, UC  HPI: Brandi Dickson is a 57 y.o. female with medical history significant of left Breast Ca status post lumpectomy, neoadjuvant treatment/radiation therapy, DM 2, diabetic neuropathy, ulcerative colitis, HTN.   Presented to [facility] hospital with complaints of diarrhea, rectal bleeding, and abdominal pain.  In regards to her UC she has been on Asacol 4.8 grams daily.  Has not been seen in our office since 07/2016.  Was lasted admitted for GI issues in 4/20118 at which time she was treated with steroid taper.  In 07/2016 she reported doing well and was maintained on Asacol 1.6 grams BID.  She tells me that the diarrhea never resolved, however.  She has not had a solid BM in over a year and has lived on Lomotil and Imodium because she always contributed the diarrhea to her chemo.  She has been off of chemo for the past couple of weeks now and she had a few days where she thought she was getting better but then things worsened again.  Having more than 10 BM's per day, sometimes up 3-4 times at night.  Has had incontinence.  Bleeding really just started this week, but became very severe yesterday with clots, which is what prompted to her come to the ED.  Lower abdominal pain/cramping started just over the past several days as well.  No fevers.  Some nausea but no vomiting.  Upon evaluation in the ED CT scan of the abdomen and pelvis showed the following:  IMPRESSION: Wall thickening/inflammatory changes involving the left colon, suggesting infectious/inflammatory colitis, possibly ulcerative colitis.  No findings suspicious for metastatic disease in this patient with history of breast cancer.  Cdiff Ag is positive but toxin is negative.  PCR is pending.  She has been started on IV solumedrol 40 mg daily, but she has not received a  dose yet.  Potassium was low at 3.1 grams but has been replaced.  Leukocytosis up to 18.6 but down to 13.1 this AM.  Hgb 10.7 grams.  Colonoscopy 07/2015 showed the following:  - One 6 mm polyp in the cecum, removed with a cold snare. Resected and retrieved. - Diffuse moderate inflammation was found in the rectum, in the sigmoid colon, in the descending colon and at the splenic flexure secondary to ulcerative colitis. Biopsied. - Diffuse mild inflammation was found in the transverse colon and at the hepatic flexure secondary to ulcerative colitis. Biopsied.  Biopsies revealed mild to moderately active colitis at that time.   Past Medical History:  Diagnosis Date  . Arthritis   . Asthma    triggered with [name] perfumes and cigarette smoke  . Cancer ([place])   . Colitis   . Diabetes mellitus without complication ([place])   . History of radiation therapy 11/22/16-01/10/17   left breast 50.4 Gy in 28 fractions, axillary region 45 Gy in 25 fractions, lumpectomy cavity boost 10 Gy tin 5 fractions  . Hypertension   . Neuropathy     Past Surgical History:  Procedure Laterality Date  . ABDOMINAL HYSTERECTOMY    . BREAST LUMPECTOMY WITH RADIOACTIVE SEED AND SENTINEL LYMPH NODE BIOPSY Left 10/09/2016   Procedure: LEFT BREAST LUMPECTOMY WITH RADIOACTIVE SEED AND L4EFT AXILLARY SENTINEL LYMPH NODE BIOPSY;  Surgeon: [name], MD;  Location: [place];  Service: General;  Laterality: Left;  . DILATION AND CURETTAGE  OF UTERUS    . KNEE ARTHROSCOPY Left   . PORTACATH PLACEMENT Right 05/08/2016   Procedure: INSERTION PORT-A-CATH WITH [place];  Surgeon: [name], MD;  Location: [place];  Service: General;  Laterality: Right;  . TONSILLECTOMY    . TOTAL HIP ARTHROPLASTY Right   . TOTAL HIP ARTHROPLASTY Left 03/06/2016   Procedure: LEFT TOTAL HIP ARTHROPLASTY ANTERIOR APPROACH;  Surgeon: [name], MD;  Location: [facility];  Service: Orthopedics;  Laterality:  Left;    Prior to Admission medications   Medication Sig Start Date End Date Taking? Authorizing Provider  albuterol (PROVENTIL HFA;VENTOLIN HFA) 108 (90 Base) MCG/ACT inhaler Inhale 1-2 puffs into the lungs every 6 (six) hours as needed for wheezing or shortness of breath.   Yes [provider]  dicyclomine (BENTYL) 20 MG tablet Take 1 tablet (20 mg total) by mouth 4 (four) times daily -  before meals and at bedtime. 06/29/16  Yes Owens Shark, NP  diphenoxylate-atropine (LOMOTIL) 2.5-0.025 MG tablet Take one to two tablets every six hours as needed for diarrhea 04/08/17  Yes Truitt Merle, MD  gabapentin (NEURONTIN) 100 MG capsule Take 200 mg by mouth at bedtime.    Yes [provider]  loperamide (IMODIUM A-D) 2 MG tablet Take 4 mg by mouth 4 (four) times daily as needed for diarrhea or loose stools.   Yes [provider]  magnesium oxide (MAG-OX) 400 MG tablet Take 400 mg by mouth daily.    Yes [provider]  Mesalamine 800 MG TBEC Take 2 tablets (1,600 mg total) by mouth 2 (two) times daily. 05/02/17  Yes Ladene Artist, MD  metFORMIN (GLUCOPHAGE-XR) 500 MG 24 hr tablet Take 1,000 mg by mouth at bedtime.   Yes [provider]  metoprolol (LOPRESSOR) 50 MG tablet Take 50 mg by mouth 2 (two) times daily. Takes 1 in the am and 1 at [facility]   Yes [provider]  niacin 250 MG tablet Take 250 mg by mouth at bedtime.    Yes [provider]  potassium chloride SA (KLOR-CON M20) 20 MEQ tablet Take 1 tablet (20 mEq total) by mouth 2 (two) times daily. 05/10/17  Yes Truitt Merle, MD  triamterene-hydrochlorothiazide (MAXZIDE) 75-50 MG per tablet Take 0.5 tablets by mouth daily.    Yes [provider]  capecitabine (XELODA) 500 MG tablet Take  4 tabs in the morning and 3 tab in evening, 2 weeks on and one week off Patient not taking: Reported on 05/31/2017 03/29/17   Truitt Merle, MD  diazepam (VALIUM) 5 MG tablet Take 1 tablet (5 mg total) by mouth  as directed. Take 5 mg by mouth 30 minutes prior to MRI procedure.   May repeat x 1. DO NOT Drive after taking Valium as it may cause drowsiness. Patient not taking: Reported on 05/31/2017 09/04/16   Truitt Merle, MD  HYDROcodone-acetaminophen (NORCO/VICODIN) 5-325 MG tablet Take 1-2 tablets by mouth every 6 (six) hours as needed for moderate pain. Patient not taking: Reported on 05/31/2017 10/09/16   [name], MD  hydrocortisone (ANUSOL-HC) 25 MG suppository Place 1 suppository (25 mg total) rectally 2 (two) times daily. Patient not taking: Reported on 05/31/2017 05/24/16   Charlynne Cousins, MD  lidocaine-prilocaine (EMLA) cream Apply 1 application to skin 1.5 to 2 hrs before use.  Cover to secure cream with plastic wrap. Patient not taking: Reported on 05/31/2017 05/10/16   Truitt Merle, MD  LORazepam (ATIVAN) 0.5 MG tablet Take  1 tablet (0.5 mg total) by mouth once as needed for anxiety. Patient not taking: Reported on 05/31/2017 05/02/16   Truitt Merle, MD  ondansetron (ZOFRAN) 8 MG tablet Take 1 tablet (8 mg total) by mouth 2 (two) times daily as needed for refractory nausea / vomiting. Start on day 3 after chemo. Patient not taking: Reported on 05/31/2017 05/02/16   Truitt Merle, MD  prochlorperazine (COMPAZINE) 10 MG tablet Take 1 tablet (10 mg total) by mouth every 6 (six) hours as needed (Nausea or vomiting). Patient not taking: Reported on 05/31/2017 05/02/16   Truitt Merle, MD  traMADol (ULTRAM) 50 MG tablet Take 1-2 tablets (50-100 mg total) by mouth every 6 (six) hours as needed. Patient not taking: Reported on 05/31/2017 03/06/16   Danae Orleans, PA-C    Current Facility-Administered Medications  Medication Dose Route Frequency Provider Last Rate Last Dose  . acetaminophen (TYLENOL) tablet 650 mg  650 mg Oral Q6H PRN Toy Baker, MD       Or  . acetaminophen (TYLENOL) suppository 650 mg  650 mg Rectal Q6H PRN Doutova, Anastassia, MD      . gabapentin (NEURONTIN) capsule 200 mg  200 mg Oral QHS Doutova,  Anastassia, MD   200 mg at 06/01/17 0053  . insulin aspart (novoLOG) injection 0-9 Units  0-9 Units Subcutaneous Q4H Toy Baker, MD   2 Units at 06/01/17 6283  . magnesium oxide (MAG-OX) tablet 400 mg  400 mg Oral Daily Doutova, Anastassia, MD      . Mesalamine (ASACOL) DR capsule 1,600 mg  1,600 mg Oral BID Doutova, Anastassia, MD   1,600 mg at 06/01/17 0053  . methylPREDNISolone sodium succinate (SOLU-MEDROL) 40 mg/mL injection 40 mg  40 mg Intravenous Q24H Doutova, Anastassia, MD      . ondansetron (ZOFRAN) tablet 4 mg  4 mg Oral Q6H PRN Doutova, Anastassia, MD       Or  . ondansetron (ZOFRAN) injection 4 mg  4 mg Intravenous Q6H PRN Doutova, Anastassia, MD      . potassium chloride (K-DUR,KLOR-CON) CR tablet 20 mEq  20 mEq Oral BID Doutova, Anastassia, MD      . sodium chloride flush (NS) 0.9 % injection 10-40 mL  10-40 mL Intracatheter Q12H Doutova, Anastassia, MD      . sodium chloride flush (NS) 0.9 % injection 10-40 mL  10-40 mL Intracatheter PRN Toy Baker, MD       Facility-Administered Medications Ordered in Other Encounters  Medication Dose Route Frequency Provider Last Rate Last Dose  . heparin lock flush 100 unit/mL  500 Units Intracatheter Once PRN Truitt Merle, MD      . sodium chloride flush (NS) 0.9 % injection 10 mL  10 mL Intracatheter PRN Truitt Merle, MD      . sodium chloride flush (NS) 0.9 % injection 10 mL  10 mL Intracatheter PRN Truitt Merle, MD   10 mL at 08/24/16 1725  . sodium chloride flush (NS) 0.9 % injection 10 mL  10 mL Intracatheter PRN Truitt Merle, MD   10 mL at 11/16/16 1628    Allergies as of 05/31/2017 - Review Complete 05/31/2017  Allergen Reaction Noted  . Lisinopril Palpitations 07/11/2015  . Augmentin [amoxicillin-pot clavulanate] Other (See Comments) 09/21/2014  . Benadryl [diphenhydramine] Itching and Anxiety 05/13/2016  . Losartan potassium Palpitations 10/05/2016    Family History  Problem Relation Age of Onset  . Colon cancer  Father   . Stomach cancer Paternal Uncle   . Stomach cancer  Paternal Uncle   . Melanoma Brother   . Thyroid cancer Brother   . Breast cancer Maternal Aunt   . Breast cancer Maternal Aunt   . Breast cancer Cousin     Social History   Social History Narrative   Married   Former smoker   No ETOH, drug use     Review of Systems: ROS is O/W negative except as mentioned in HPI.  Physical Exam: Vital signs in last 24 hours: Temp:  [97.8 F (36.6 C)-98.2 F (36.8 C)] 98.2 F (36.8 C) (05/04 0407) Pulse Rate:  [69-106] 69 (05/04 0407) Resp:  [16-18] 17 (05/04 0407) BP: (97-127)/(56-89) 108/59 (05/04 0407) SpO2:  [97 %-100 %] 97 % (05/04 0407) Weight:  [190 lb (86.2 kg)-209 lb 1.6 oz (94.8 kg)] 209 lb 1.6 oz (94.8 kg) (05/03 2233) Last BM Date: 05/31/17 General:  Alert, Well-developed, well-nourished, pleasant and cooperative in NAD Head:  Normocephalic and atraumatic. Eyes:  Sclera clear, no icterus.  Conjunctiva pink. Ears:  Normal auditory acuity. Mouth:  No deformity or lesions.   Lungs:  Clear throughout to auscultation.  No wheezes, crackles, or rhonchi.  Heart:  Regular rate and rhythm; no murmurs, clicks, rubs, or gallops. Abdomen:  Soft, non-distended.  BS present.  Left sided TTP. Msk:  Symmetrical without gross deformities. Pulses:  Normal pulses noted. Extremities:  Without clubbing or edema. Neurologic:  Alert and oriented x 4;  grossly normal neurologically. Skin:  Intact without significant lesions or rashes. Psych:  Alert and cooperative. Normal mood and affect.  Intake/Output from previous day: 05/03 0701 - 05/04 0700 In: 1120 [P.O.:120; IV Piggyback:1000] Out: 600 [Urine:600]  Lab Results: Recent Labs    05/31/17 1607 05/31/17 2305 06/01/17 0538  WBC 12.1* 18.6* 13.1*  HGB 11.9* 11.3* 10.7*  HCT 37.4 34.8* 33.4*  PLT 210 202 213   BMET Recent Labs    05/31/17 1607 06/01/17 0538  NA 139 138  K 3.3* 3.8  CL 107 106  CO2 21* 23  GLUCOSE  153* 177*  BUN 15 13  CREATININE 0.97 0.77  CALCIUM 8.5* 8.7*   LFT Recent Labs    06/01/17 0538  PROT 6.4*  ALBUMIN 2.6*  AST 14*  ALT 13*  ALKPHOS 68  BILITOT 0.6   Studies/Results: Ct Abdomen Pelvis W Contrast  Result Date: 05/31/2017 CLINICAL DATA:  Lower abdominal pain, diarrhea x2 weeks EXAM: CT ABDOMEN AND PELVIS WITH CONTRAST TECHNIQUE: Multidetector CT imaging of the abdomen and pelvis was performed using the standard protocol following bolus administration of intravenous contrast. CONTRAST:  131mL ISOVUE-300 IOPAMIDOL (ISOVUE-300) INJECTION 61% COMPARISON:  MRI abdomen dated 08/15/2016. CT abdomen/pelvis dated 06/07/2016. FINDINGS: Lower chest: Lung bases are essentially clear. Hepatobiliary: Liver is within normal limits. Gallbladder is unremarkable. No intrahepatic or extrahepatic duct dilatation. Pancreas: Within normal limits. Spleen: Within normal limits. Adrenals/Urinary Tract: Adrenal glands within normal limits. Kidneys are within normal limits, noting an 8 mm left lower pole renal cyst. No hydronephrosis. Bladder is partially obscured by streak artifact but grossly unremarkable. Stomach/Bowel: Stomach is within normal limits. No evidence of bowel obstruction. Normal appendix (series 2/image 37). Tubular/ahaustral appearance of the descending/sigmoid colon with pericolonic inflammatory changes and vascular engorgement. Wall thickening involving the rectum (series 2/image 80). This appearance suggests infectious inflammatory colitis such as ulcerative colitis. Vascular/Lymphatic: No evidence of abdominal aortic aneurysm. No suspicious abdominopelvic lymphadenopathy. Reproductive: Status post hysterectomy. No adnexal masses. Other: No abdominopelvic ascites. Musculoskeletal: Mild degenerative changes of the visualized thoracolumbar spine. Bilateral  hip arthroplasties, without evidence of complication. IMPRESSION: Wall thickening/inflammatory changes involving the left colon,  suggesting infectious/inflammatory colitis, possibly ulcerative colitis. No findings suspicious for metastatic disease in this patient with history of breast cancer. Electronically Signed   By: Julian Hy M.D.   On: 05/31/2017 17:44   IMPRESSION:  *57 year old female with known UC who presents with complaints of abdominal pain, diarrhea, and rectal bleeding.  Suspect UC flare. *Leukocytosis:  Secondary to above.  Improving. *Hypokalemia:  Due to GI losses.  Improved with repletion.  PLAN: *Await Cdiff PCR. *IV solumedrol 40 mg daily as ordered but I moved up the dose to this AM so that she does not get it in the evening and interfere with sleep.  If Cdiff PCR is positive then we will need to stop it. *Monitor CBC and BMP. *Continue Asacol 1600 mg BID for now.   Laban Emperor. Zehr  06/01/2017, 8:51 AM     [facility] Attending   I have taken an interval history, reviewed the chart and examined the patient. I agree with the Advanced Practitioner's note, impression and recommendations.   C diff PCR is + so will treat for C diff vancomycin 125 mg qid x 10 d  Make mesalamine (Asacol 1.6 g tid)  Stop steroids - if not improving with vancomycin as expected will consider restarting as IBD patients can be a false + C diff  Could need flex sig to help sort out  Gatha Mayer, MD, [facility] 06/01/2017 1:20 PM

## 2017-06-02 DIAGNOSIS — K51011 Ulcerative (chronic) pancolitis with rectal bleeding: Secondary | ICD-10-CM

## 2017-06-02 DIAGNOSIS — D649 Anemia, unspecified: Secondary | ICD-10-CM

## 2017-06-02 DIAGNOSIS — A0472 Enterocolitis due to Clostridium difficile, not specified as recurrent: Secondary | ICD-10-CM

## 2017-06-02 LAB — GLUCOSE, CAPILLARY
GLUCOSE-CAPILLARY: 121 mg/dL — AB (ref 65–99)
GLUCOSE-CAPILLARY: 125 mg/dL — AB (ref 65–99)
Glucose-Capillary: 120 mg/dL — ABNORMAL HIGH (ref 65–99)
Glucose-Capillary: 136 mg/dL — ABNORMAL HIGH (ref 65–99)
Glucose-Capillary: 136 mg/dL — ABNORMAL HIGH (ref 65–99)
Glucose-Capillary: 161 mg/dL — ABNORMAL HIGH (ref 65–99)
Glucose-Capillary: 91 mg/dL (ref 65–99)

## 2017-06-02 MED ORDER — RESOURCE INSTANT PROTEIN PO PWD PACKET
1.0000 | Freq: Three times a day (TID) | ORAL | Status: DC
  Administered 2017-06-02 – 2017-06-03 (×2): 6 g via ORAL
  Filled 2017-06-02 (×3): qty 6

## 2017-06-02 MED ORDER — BENEPROTEIN PO POWD: 1.0000 | Freq: Three times a day (TID) | ORAL | Status: DC

## 2017-06-02 NOTE — Progress Notes (Signed)
Initial Nutrition Assessment  DOCUMENTATION CODES:   Obesity unspecified  INTERVENTION:   Provide Beneprotein powder with meals TID, each packet provides 25 kcal and 6g protein  NUTRITION DIAGNOSIS:   Increased nutrient needs related to cancer and cancer related treatments as evidenced by estimated needs.  GOAL:   Patient will meet greater than or equal to 90% of their needs  MONITOR:   PO intake, Supplement acceptance, Labs, Weight trends, I & O's  REASON FOR ASSESSMENT:   Malnutrition Screening Tool    ASSESSMENT:   57 year old female with medical history significant for left breast cancer status post lumpectomy, completed radiation and chemotherapy, diabetes mellitus type 2, exudative colitis, hypertension and neuropathy.   Patient currently consuming 100% of all meals so far. Pt has continued to lose weight since February 2019 but is insignificant for time frame. Pt does not like Ensure or Boost drinks, will order Beneprotein powder to add to foods.   Medications: MAG-OX tablet daily, K-DUR tablet BID Labs reviewed: CBGs: 135-161   NUTRITION - FOCUSED PHYSICAL EXAM:  Nutrition focused physical exam shows no sign of depletion of muscle mass or body fat.  Diet Order:   Diet Order           DIET SOFT Room service appropriate? Yes; Fluid consistency: Thin  Diet effective now          EDUCATION NEEDS:   No education needs have been identified at this time  Skin:  Skin Assessment: Reviewed RN Assessment  Last BM:  5/5  Height:   Ht Readings from Last 1 Encounters:  05/31/17 5\' 4"  (1.626 m)    Weight:   Wt Readings from Last 1 Encounters:  05/31/17 209 lb 1.6 oz (94.8 kg)    Ideal Body Weight:  54.5 kg  BMI:  Body mass index is 35.89 kg/m.  Estimated Nutritional Needs:   Kcal:  1900-2100  Protein:  80-90g  Fluid:  2L/day  Clayton Bibles, MS, RD, LDN [facility] Pager: [phone] After Hours Pager: [phone]

## 2017-06-02 NOTE — Consult Note (Addendum)
     [facility] Progress Note  CC:  Cdiff/ ? UC flare  Subjective:  Seems to be doing a little better.  Stool frequency decreased.  Only passing flatus and small amounts of stool each time she goes.  Very little blood now.  Cdiff PCR was positive., Steroids stopped and vancomycin started  Objective:  Vital signs in last 24 hours: Temp:  [98 F (36.7 C)-98.1 F (36.7 C)] 98 F (36.7 C) (05/05 0412) Pulse Rate:  [75-89] 75 (05/05 0412) Resp:  [17-18] 18 (05/05 0412) BP: (114-132)/(68-76) 125/68 (05/05 0412) SpO2:  [99 %-100 %] 100 % (05/05 0412) Last BM Date: 05/31/17 General:  Alert, Well-developed, in NAD Heart:  Regular rate and rhythm; no murmurs Pulm:  CTAB.  No increased WOB. Abdomen:  Soft, non-distended.  BS present.  Non-tender. Extremities:  Without edema. Neurologic:  Alert and oriented x 4;  grossly normal neurologically. Psych:  Alert and cooperative. Normal mood and affect.  Intake/Output from previous day: 05/04 0701 - 05/05 0700 In: 1800 [P.O.:1800] Out: -     Assessment / Plan: *57 year old female with known UC who presents with complaints of abdominal pain, diarrhea, and rectal bleeding.  Actually has positive Cdiff PCR.  Steroids have been discontinued and she was started on vancomycin on 5/4. *Leukocytosis:  Secondary to above.  Improving. *Hypokalemia:  Due to GI losses.  Improved with repletion. * mild anemia - last Hgb 10 .4    **Will give soft diet. **Patient has questions about (timing) when she can return to work in regards to the Cdiff infection.   LOS: 2 days   Laban Emperor. Zehr  06/02/2017, 8:44 AM     [facility] Attending   I have taken an interval history, reviewed the chart and examined the patient. I agree with the Advanced Practitioner's note, impression and recommendations.   She is better - almost moreso than I would expect from anything we have done. D/W patient and TRH MD - if improving again tomorrow home 10 d  course of vancomycin and also Florastor 250 mg bid Needs 4.8 g Asacol daily  She may need a colonoscopy later this year depending upon clinical course as I still have some ? How much of her problems were/are C diff, UC, and even chemotx  Dr. Silverio Decamp will see her tomorrow  Gatha Mayer, MD, [facility] 06/02/2017 12:57 PM

## 2017-06-02 NOTE — Progress Notes (Signed)
PROGRESS NOTE [facility]   [name]   WNI:[id number] DOB: [birth year]  DOA: 05/31/2017 PCP: Orpah Melter, MD   Brief Narrative:  Brandi Dickson 57 year old female with medical history significant for left breast cancer status post lumpectomy, completed radiation and chemotherapy, diabetes mellitus type 2, exudative colitis, hypertension and neuropathy.  Patient presented to the emergency department complaining of abdominal pain, watery diarrhea and bloody stools.  Upon ED evaluation WBC up to 18, FOBT positive, tachycardic, low potassium and hemoglobin of 11.9. CT of the abdomen and pelvis shows colitis.  Patient was admitted with working diagnosis of ulcerative colitis flare, C. difficile work-up was sent which showed C. difficile antigen positive, C. difficile toxin negative and C. difficile toxigenic PCR positive.  Subjective: Brandi Dickson seen and examined, continues to improve, of movement with scant blood.  Denies nausea vomiting.  Afebrile  Assessment & Plan: Colitis ? UC flare vs Cdiff or both, C. difficile antigen positive, C. difficile toxin negative and C. difficile toxigenic PCR positive. Seems to be that she is colonized but not producing toxin, and GI recommending to treat with vancomycin x10-day.  Patient has some clinical improvement, GI recommending to stop steroids.  Treat with Florastor and Asacol four-point grams daily. ?  How much this could be from chemotherapy as well. Follow-up CBC and BMP in a.m. advance diet as tolerated  Diabetes mellitus type 2 A1c 5.9 Continue SSI  Hypertension BP was soft upon admission, antihypertensive medications on hold.  Blood pressure improved after IV hydration.  BP remains stable off antihypertensive medication, continue to monitor.  Hypokalemia -resolved  DVT prophylaxis: SCDs Code Status: Full code Family Communication: Husband at bedside Disposition Plan: Home in a.m. if continues to improve  Consultants:    GI  Procedures:   None  Antimicrobials:  None   Objective: Vitals:   06/01/17 1619 06/01/17 2004 06/02/17 0005 06/02/17 0412  BP: 116/72 114/68 132/76 125/68  Pulse: 82 89 77 75  Resp: 18 17 18 18   Temp: 98.1 F (36.7 C) 98 F (36.7 C) 98.1 F (36.7 C) 98 F (36.7 C)  TempSrc: Oral Oral Oral Oral  SpO2: 99% 99% 99% 100%  Weight:      Height:        Intake/Output Summary (Last 24 hours) at 06/02/2017 1536 Last data filed at 06/02/2017 0900 Gross per 24 hour  Intake 1200 ml  Output -  Net 1200 ml   Filed Weights   05/31/17 1504 05/31/17 2233  Weight: 86.2 kg (190 lb) 94.8 kg (209 lb 1.6 oz)    Examination:  General: Pt is alert, awake, not in acute distress Cardiovascular: RRR, S1/S2 +, no rubs, no gallops Respiratory: CTA bilaterally, no wheezing, no rhonchi Abdominal: Soft, NT, ND, bowel sounds + Extremities: no edema, no cyanosis  Data Reviewed: I have personally reviewed following labs and imaging studies  CBC: Recent Labs  Lab 05/31/17 1607 05/31/17 2305 06/01/17 0538 06/01/17 1420  WBC 12.1* 18.6* 13.1* 12.7*  HGB 11.9* 11.3* 10.7* 10.4*  HCT 37.4 34.8* 33.4* 32.1*  MCV 92.6 91.8 91.8 92.2  PLT 210 202 213 381   Basic Metabolic Panel: Recent Labs  Lab 05/31/17 1607 06/01/17 0538  NA 139 138  K 3.3* 3.8  CL 107 106  CO2 21* 23  GLUCOSE 153* 177*  BUN 15 13  CREATININE 0.97 0.77  CALCIUM 8.5* 8.7*  MG 1.3* 2.1  PHOS  --  3.2   GFR: Estimated Creatinine Clearance: 85.6 mL/min (by  C-G formula based on SCr of 0.77 mg/dL). Liver Function Tests: Recent Labs  Lab 05/31/17 1607 06/01/17 0538  AST 22 14*  ALT 12* 13*  ALKPHOS 80 68  BILITOT 0.6 0.6  PROT 6.5 6.4*  ALBUMIN 2.9* 2.6*   No results for input(s): LIPASE, AMYLASE in the last 168 hours. No results for input(s): AMMONIA in the last 168 hours. Coagulation Profile: No results for input(s): INR, PROTIME in the last 168 hours. Cardiac Enzymes: No results for input(s):  CKTOTAL, CKMB, CKMBINDEX, TROPONINI in the last 168 hours. BNP (last 3 results) No results for input(s): PROBNP in the last 8760 hours. HbA1C: Recent Labs    06/01/17 0538  HGBA1C 5.9*   CBG: Recent Labs  Lab 06/01/17 2003 06/02/17 0000 06/02/17 0402 06/02/17 0751 06/02/17 1204  GLUCAP 215* 120* 121* 136* 161*   Lipid Profile: No results for input(s): CHOL, HDL, LDLCALC, TRIG, CHOLHDL, LDLDIRECT in the last 72 hours. Thyroid Function Tests: Recent Labs    06/01/17 0538  TSH 0.358   Anemia Panel: No results for input(s): VITAMINB12, FOLATE, FERRITIN, TIBC, IRON, RETICCTPCT in the last 72 hours. Sepsis Labs: No results for input(s): PROCALCITON, LATICACIDVEN in the last 168 hours.  Recent Results (from the past 240 hour(s))  C difficile quick scan w PCR reflex     Status: Abnormal   Collection Time: 06/01/17  3:30 AM  Result Value Ref Range Status   C Diff antigen POSITIVE (A) NEGATIVE Final   C Diff toxin NEGATIVE NEGATIVE Final   C Diff interpretation Results are indeterminate. See PCR results.  Final    Comment: Performed at [facility] [address] [address], [place] [address] [place], [place] [address]  C. Diff by PCR, Reflexed     Status: Abnormal   Collection Time: 06/01/17  3:30 AM  Result Value Ref Range Status   Toxigenic C. Difficile by PCR POSITIVE (A) NEGATIVE Final    Comment: Positive for toxigenic C. difficile with little to no toxin production. Only treat if clinical presentation suggests symptomatic illness. Performed at [facility], [place] [address], [place], [place] [address]       Radiology Studies: Ct Abdomen Pelvis W Contrast  Result Date: 05/31/2017 CLINICAL DATA:  Lower abdominal pain, diarrhea x2 weeks EXAM: CT ABDOMEN AND PELVIS WITH CONTRAST TECHNIQUE: Multidetector CT imaging of the abdomen and pelvis was performed using the standard protocol following bolus administration of intravenous contrast. CONTRAST:  156m ISOVUE-300 IOPAMIDOL  (ISOVUE-300) INJECTION 61% COMPARISON:  MRI abdomen dated 08/15/2016. CT abdomen/pelvis dated 06/07/2016. FINDINGS: Lower chest: Lung bases are essentially clear. Hepatobiliary: Liver is within normal limits. Gallbladder is unremarkable. No intrahepatic or extrahepatic duct dilatation. Pancreas: Within normal limits. Spleen: Within normal limits. Adrenals/Urinary Tract: Adrenal glands within normal limits. Kidneys are within normal limits, noting an 8 mm left lower pole renal cyst. No hydronephrosis. Bladder is partially obscured by streak artifact but grossly unremarkable. Stomach/Bowel: Stomach is within normal limits. No evidence of bowel obstruction. Normal appendix (series 2/image 37). Tubular/ahaustral appearance of the descending/sigmoid colon with pericolonic inflammatory changes and vascular engorgement. Wall thickening involving the rectum (series 2/image 80). This appearance suggests infectious inflammatory colitis such as ulcerative colitis. Vascular/Lymphatic: No evidence of abdominal aortic aneurysm. No suspicious abdominopelvic lymphadenopathy. Reproductive: Status post hysterectomy. No adnexal masses. Other: No abdominopelvic ascites. Musculoskeletal: Mild degenerative changes of the visualized thoracolumbar spine. Bilateral hip arthroplasties, without evidence of complication. IMPRESSION: Wall thickening/inflammatory changes involving the left colon, suggesting infectious/inflammatory colitis, possibly ulcerative colitis.  No findings suspicious for metastatic disease in this patient with history of breast cancer. Electronically Signed   By: Julian Hy M.D.   On: 05/31/2017 17:44    Scheduled Meds: . gabapentin  200 mg Oral QHS  . insulin aspart  0-9 Units Subcutaneous Q4H  . magnesium oxide  400 mg Oral Daily  . Mesalamine  1,600 mg Oral TID  . potassium chloride SA  20 mEq Oral BID  . protein supplement  1 scoop Oral TID WC  . sodium chloride flush  10-40 mL Intracatheter Q12H  .  vancomycin  125 mg Oral QID   Continuous Infusions:   LOS: 2 days    Time spent: Total of 25 minutes spent with pt, greater than 50% of which was spent in discussion of  treatment, counseling and coordination of care   Chipper Oman, MD Pager: Text Page via [email or web address]   If 7PM-7AM, please contact night-coverage [email or web address] 06/02/2017, 3:36 PM   Note - This record has been created using [employer]. Chart creation errors have been sought, but may not always have been located. Such creation errors do not reflect on the standard of medical care.

## 2017-06-03 ENCOUNTER — Other Ambulatory Visit: Payer: Self-pay | Admitting: Gastroenterology

## 2017-06-03 ENCOUNTER — Telehealth: Payer: Self-pay | Admitting: Gastroenterology

## 2017-06-03 LAB — GLUCOSE, CAPILLARY
Glucose-Capillary: 83 mg/dL (ref 65–99)
Glucose-Capillary: 153 mg/dL — ABNORMAL HIGH (ref 65–99)

## 2017-06-03 LAB — BASIC METABOLIC PANEL
ANION GAP: 7 (ref 5–15)
BUN: 13 mg/dL (ref 6–20)
CALCIUM: 8.4 mg/dL — AB (ref 8.9–10.3)
CO2: 25 mmol/L (ref 22–32)
CREATININE: 0.88 mg/dL (ref 0.44–1.00)
Chloride: 109 mmol/L (ref 101–111)
GFR calc Af Amer: >60 mL/min (ref >60)
GFR calc non Af Amer: >60 mL/min (ref >60)
GLUCOSE: 89 mg/dL (ref 65–99)
Potassium: 4.4 mmol/L (ref 3.5–5.1)
Sodium: 141 mmol/L (ref 135–145)

## 2017-06-03 LAB — CBC
HCT: 32.4 % — ABNORMAL LOW (ref 36.0–46.0)
HEMOGLOBIN: 10.2 g/dL — AB (ref 12.0–15.0)
MCH: 29.3 pg (ref 26.0–34.0)
MCHC: 31.5 g/dL (ref 30.0–36.0)
MCV: 93.1 fL (ref 78.0–100.0)
Platelets: 234 10*3/uL (ref 150–400)
RBC: 3.48 MIL/uL — ABNORMAL LOW (ref 3.87–5.11)
RDW: 15.9 % — AB (ref 11.5–15.5)
WBC: 7.3 10*3/uL (ref 4.0–10.5)

## 2017-06-03 LAB — MAGNESIUM: MAGNESIUM: 1.9 mg/dL (ref 1.7–2.4)

## 2017-06-03 MED ORDER — MESALAMINE 400 MG PO CPDR: 1600.0000 mg | DELAYED_RELEASE_CAPSULE | Freq: Three times a day (TID) | ORAL | 0 refills | Status: DC

## 2017-06-03 MED ORDER — HEPARIN SOD (PORK) LOCK FLUSH 100 UNIT/ML IV SOLN
500.0000 [IU] | INTRAVENOUS | Status: AC | PRN
  Administered 2017-06-03: 500 [IU]

## 2017-06-03 MED ORDER — VANCOMYCIN HCL 125 MG PO CAPS: 125.0000 mg | ORAL_CAPSULE | Freq: Four times a day (QID) | ORAL | 0 refills | Status: AC

## 2017-06-03 MED ORDER — SACCHAROMYCES BOULARDII 250 MG PO CAPS: 250.0000 mg | ORAL_CAPSULE | Freq: Two times a day (BID) | ORAL | 0 refills | Status: AC

## 2017-06-03 NOTE — Telephone Encounter (Signed)
Spoke to patient she states she would like her refill for mesalamine sent to the [facility] on [address]. She is currently admitted but next week she is going out of town and wanted to make sure she had a refill on file. Pt had rx filled today but it was sent to another [facility]. Informed her to keep her appointment at the end of this month for refills

## 2017-06-03 NOTE — Telephone Encounter (Signed)
Brandi Dickson called and rescheduled her 06-05-17 appointment to 06-19-17 at [place]

## 2017-06-03 NOTE — Progress Notes (Signed)
Patient given discharge instructions, and verbalized an understanding of all discharge instructions.  Patient agrees with discharge plan, and is being discharged in stable medical condition.  Patient given transportation via wheelchair. 

## 2017-06-03 NOTE — Discharge Summary (Signed)
Physician Discharge Summary  Brandi Dickson  QPR:[id number]  DOB: Oct 02, 1960  DOA: 05/31/2017 PCP: Orpah Melter, MD  Admit date: 05/31/2017 Discharge date: 06/03/2017  Admitted From: Home  Disposition: Home   Recommendations for Outpatient Follow-up:  1. Follow up with PCP in 1 week 2. Follow up GI on 5/22 at 11 AM 3. Please obtain BMP/CBC in one week to monitor renal function and hemoglobin  Discharge Condition: Stable CODE STATUS: Full code Diet recommendation: Carb Modified/low fiber  Brief/Interim Summary: For full details see H&P/Progress note, but in brief, Brandi Dickson is a 57 year old female with medical history significant for left breast cancer status post lumpectomy, completed radiation and chemotherapy, diabetes mellitus type 2, exudative colitis, hypertension and neuropathy.  Patient presented to the emergency department complaining of abdominal pain, watery diarrhea and bloody stools.  Upon ED evaluation WBC up to 18, FOBT positive, tachycardic, low potassium and hemoglobin of 11.9. CT of the abdomen and pelvis shows colitis.  Patient was admitted with working diagnosis of ulcerative colitis flare, C. difficile work-up was sent which showed C. difficile antigen positive, C. difficile toxin negative and C. difficile toxigenic PCR positive.  GI was consulted and was started on treatment for C. difficile with oral vancomycin  Subjective: She is seen and examined, doing well.  Denies any abdominal pain diarrhea continues to improve.  No acute events overnight, remains afebrile.  White count improving.  Discharge Diagnoses/Hospital Course:  Colitis ? UC flare vs Cdiff or both, C. difficile antigen positive, C. difficile toxin negative and C. difficile toxigenic PCR positive. Seems to be that she is colonized but not producing toxin, and GI recommending to treat with vancomycin x10-day.  Patient showed clinical improvement.  She was initially treated with IV Solu-Medrol which  GI is recommending to discontinue at this time. ?  How much this could be from chemotherapy as well.  Continue Asacol 4.8 g daily, Florastor twice daily for 30 days complete vancomycin treatment.  Patient will follow-up with GI as an outpatient.  Advised low fiber diet due to diarrhea resolved.  Hold on Lomotil and Imodium as were treating as an infectious process.  Lower GI bleed See above  Diabetes mellitus type 2 A1c 5.9 CBGs stable during hospital stay Not on medication, diet control.  Encourage diet and exercise.  Hypertension BP was soft upon admission, antihypertensive medications on hold.  Blood pressure improved after IV hydration. Resume home antihypertensive medication with no changes.  Follow-up with PCP  Hypokalemia -resolved Check BMP in 1 week  All other chronic medical condition were stable during the hospitalization.  On the day of the discharge the patient's vitals were stable, and no other acute medical condition were reported by patient. the patient was felt safe to be discharge to home.   Discharge Instructions  You were cared for by a hospitalist during your hospital stay. If you have any questions about your discharge medications or the care you received while you were in the hospital after you are discharged, you can call the unit and asked to speak with the hospitalist on call if the hospitalist that took care of you is not available. Once you are discharged, your primary care physician will handle any further medical issues. Please note that NO REFILLS for any discharge medications will be authorized once you are discharged, as it is imperative that you return to your primary care physician (or establish a relationship with a primary care physician if you do not have one) for  your aftercare needs so that they can reassess your need for medications and monitor your lab values.  Discharge Instructions    Call MD for:  difficulty breathing, headache or visual  disturbances   Complete by:  As directed    Call MD for:  extreme fatigue   Complete by:  As directed    Call MD for:  hives   Complete by:  As directed    Call MD for:  persistant dizziness or light-headedness   Complete by:  As directed    Call MD for:  persistant nausea and vomiting   Complete by:  As directed    Call MD for:  redness, tenderness, or signs of infection (pain, swelling, redness, odor or green/yellow discharge around incision site)   Complete by:  As directed    Call MD for:  severe uncontrolled pain   Complete by:  As directed    Call MD for:  temperature >100.4   Complete by:  As directed    Diet - low sodium heart healthy   Complete by:  As directed    Increase activity slowly   Complete by:  As directed      Allergies as of 06/03/2017      Reactions   Lisinopril Palpitations   Augmentin [amoxicillin-pot Clavulanate] Other (See Comments)   sts gives her a yeast infection Has patient had a PCN reaction causing immediate rash, facial/tongue/throat swelling, SOB or lightheadedness with hypotension: no Has patient had a PCN reaction causing severe rash involving mucus membranes or skin necrosis: no Has patient had a PCN reaction that required hospitalization no Has patient had a PCN reaction occurring within the last 10 years: unknown If all of the above answers are "NO", then may proceed with Cephalosporin use.   Benadryl [diphenhydramine] Itching, Anxiety   Per pt: "Makes my skin crawl"; makes pt sensitive to touch   Losartan Potassium Palpitations      Medication List    STOP taking these medications   capecitabine 500 MG tablet Commonly known as:  XELODA   diazepam 5 MG tablet Commonly known as:  VALIUM   diphenoxylate-atropine 2.5-0.025 MG tablet Commonly known as:  LOMOTIL   HYDROcodone-acetaminophen 5-325 MG tablet Commonly known as:  NORCO/VICODIN   hydrocortisone 25 MG suppository Commonly known as:  ANUSOL-HC   lidocaine-prilocaine  cream Commonly known as:  EMLA   loperamide 2 MG tablet Commonly known as:  IMODIUM A-D   LORazepam 0.5 MG tablet Commonly known as:  ATIVAN   Mesalamine 800 MG Tbec Replaced by:  Mesalamine 400 MG Cpdr DR capsule   ondansetron 8 MG tablet Commonly known as:  ZOFRAN   prochlorperazine 10 MG tablet Commonly known as:  COMPAZINE   traMADol 50 MG tablet Commonly known as:  ULTRAM     TAKE these medications   albuterol 108 (90 Base) MCG/ACT inhaler Commonly known as:  PROVENTIL HFA;VENTOLIN HFA Inhale 1-2 puffs into the lungs every 6 (six) hours as needed for wheezing or shortness of breath.   dicyclomine 20 MG tablet Commonly known as:  BENTYL Take 1 tablet (20 mg total) by mouth 4 (four) times daily -  before meals and at bedtime.   gabapentin 100 MG capsule Commonly known as:  NEURONTIN Take 200 mg by mouth at bedtime.   magnesium oxide 400 MG tablet Commonly known as:  MAG-OX Take 400 mg by mouth daily.   Mesalamine 400 MG Cpdr DR capsule Commonly known as:  ASACOL Take 4  capsules (1,600 mg total) by mouth 3 (three) times daily. Replaces:  Mesalamine 800 MG Tbec   metFORMIN 500 MG 24 hr tablet Commonly known as:  GLUCOPHAGE-XR Take 1,000 mg by mouth at bedtime.   metoprolol tartrate 50 MG tablet Commonly known as:  LOPRESSOR Take 50 mg by mouth 2 (two) times daily. Takes 1 in the am and 1 at HS   niacin 250 MG tablet Take 250 mg by mouth at bedtime.   potassium chloride SA 20 MEQ tablet Commonly known as:  KLOR-CON M20 Take 1 tablet (20 mEq total) by mouth 2 (two) times daily.   saccharomyces boulardii 250 MG capsule Commonly known as:  FLORASTOR Take 1 capsule (250 mg total) by mouth 2 (two) times daily.   triamterene-hydrochlorothiazide 75-50 MG tablet Commonly known as:  MAXZIDE Take 0.5 tablets by mouth daily.   vancomycin 125 MG capsule Commonly known as:  VANCOCIN Take 1 capsule (125 mg total) by mouth 4 (four) times daily for 8 days.       Follow-up Information    Willia Craze, NP Follow up on 06/19/2017.   Specialty:  Gastroenterology Why:  11:00am Contact information: [place] [address] [phone]          Allergies  Allergen Reactions  . Lisinopril Palpitations  . Augmentin [Amoxicillin-Pot Clavulanate] Other (See Comments)    sts gives her a yeast infection Has patient had a PCN reaction causing immediate rash, facial/tongue/throat swelling, SOB or lightheadedness with hypotension: no Has patient had a PCN reaction causing severe rash involving mucus membranes or skin necrosis: no Has patient had a PCN reaction that required hospitalization no Has patient had a PCN reaction occurring within the last 10 years: unknown If all of the above answers are "NO", then may proceed with Cephalosporin use.   . Benadryl [Diphenhydramine] Itching and Anxiety    Per pt: "Makes my skin crawl"; makes pt sensitive to touch  . Losartan Potassium Palpitations    Consultations:  GI   Procedures/Studies: Ct Abdomen Pelvis W Contrast  Result Date: 05/31/2017 CLINICAL DATA:  Lower abdominal pain, diarrhea x2 weeks EXAM: CT ABDOMEN AND PELVIS WITH CONTRAST TECHNIQUE: Multidetector CT imaging of the abdomen and pelvis was performed using the standard protocol following bolus administration of intravenous contrast. CONTRAST:  1109m ISOVUE-300 IOPAMIDOL (ISOVUE-300) INJECTION 61% COMPARISON:  MRI abdomen dated 08/15/2016. CT abdomen/pelvis dated 06/07/2016. FINDINGS: Lower chest: Lung bases are essentially clear. Hepatobiliary: Liver is within normal limits. Gallbladder is unremarkable. No intrahepatic or extrahepatic duct dilatation. Pancreas: Within normal limits. Spleen: Within normal limits. Adrenals/Urinary Tract: Adrenal glands within normal limits. Kidneys are within normal limits, noting an 8 mm left lower pole renal cyst. No hydronephrosis. Bladder is partially obscured by streak artifact but grossly  unremarkable. Stomach/Bowel: Stomach is within normal limits. No evidence of bowel obstruction. Normal appendix (series 2/image 37). Tubular/ahaustral appearance of the descending/sigmoid colon with pericolonic inflammatory changes and vascular engorgement. Wall thickening involving the rectum (series 2/image 80). This appearance suggests infectious inflammatory colitis such as ulcerative colitis. Vascular/Lymphatic: No evidence of abdominal aortic aneurysm. No suspicious abdominopelvic lymphadenopathy. Reproductive: Status post hysterectomy. No adnexal masses. Other: No abdominopelvic ascites. Musculoskeletal: Mild degenerative changes of the visualized thoracolumbar spine. Bilateral hip arthroplasties, without evidence of complication. IMPRESSION: Wall thickening/inflammatory changes involving the left colon, suggesting infectious/inflammatory colitis, possibly ulcerative colitis. No findings suspicious for metastatic disease in this patient with history of breast cancer. Electronically Signed   By: SHenderson NewcomerD.  On: 05/31/2017 17:44    Discharge Exam: Vitals:   06/03/17 0501 06/03/17 0939  BP: 137/83 124/81  Pulse: 68 87  Resp: 18 18  Temp: 98 F (36.7 C) 98 F (36.7 C)  SpO2: 99% 100%   Vitals:   06/02/17 2034 06/02/17 2326 06/03/17 0501 06/03/17 0939  BP: 133/89 130/80 137/83 124/81  Pulse: 75 81 68 87  Resp: 18 18 18 18   Temp: 98.6 F (37 C) 98.3 F (36.8 C) 98 F (36.7 C) 98 F (36.7 C)  TempSrc: Oral Oral Oral Oral  SpO2: 99% 100% 99% 100%  Weight:      Height:        General: Pt is alert, awake, not in acute distress Cardiovascular: RRR, S1/S2 +, no rubs, no gallops Respiratory: CTA bilaterally, no wheezing, no rhonchi Abdominal: Soft, NT, ND, bowel sounds + Extremities: no edema, no cyanosis  The results of significant diagnostics from this hospitalization (including imaging, microbiology, ancillary and laboratory) are listed below for reference.      Microbiology: Recent Results (from the past 240 hour(s))  C difficile quick scan w PCR reflex     Status: Abnormal   Collection Time: 06/01/17  3:30 AM  Result Value [place] Status   C Diff antigen POSITIVE (A) NEGATIVE Final   C Diff toxin NEGATIVE NEGATIVE Final   C Diff interpretation Results are indeterminate. See PCR results.  Final    Comment: Performed at [facility], [place] [address], [place], [place] [address]  C. Diff by PCR, Reflexed     Status: Abnormal   Collection Time: 06/01/17  3:30 AM  Result Value [place] Status   Toxigenic C. Difficile by PCR POSITIVE (A) NEGATIVE Final    Comment: Positive for toxigenic C. difficile with little to no toxin production. Only treat if clinical presentation suggests symptomatic illness. Performed at [facility], [place] [address] [place], [place] [address]      Labs: BNP (last 3 results) No results for input(s): BNP in the last 8760 hours. Basic Metabolic Panel: Recent Labs  Lab 05/31/17 1607 06/01/17 0538 06/03/17 0221  NA 139 138 141  K 3.3* 3.8 4.4  CL 107 106 109  CO2 21* 23 25  GLUCOSE 153* 177* 89  BUN 15 13 13   CREATININE 0.97 0.77 0.88  CALCIUM 8.5* 8.7* 8.4*  MG 1.3* 2.1 1.9  PHOS  --  3.2  --    Liver Function Tests: Recent Labs  Lab 05/31/17 1607 06/01/17 0538  AST 22 14*  ALT 12* 13*  ALKPHOS 80 68  BILITOT 0.6 0.6  PROT 6.5 6.4*  ALBUMIN 2.9* 2.6*   No results for input(s): LIPASE, AMYLASE in the last 168 hours. No results for input(s): AMMONIA in the last 168 hours. CBC: Recent Labs  Lab 05/31/17 1607 05/31/17 2305 06/01/17 0538 06/01/17 1420 06/03/17 0221  WBC 12.1* 18.6* 13.1* 12.7* 7.3  HGB 11.9* 11.3* 10.7* 10.4* 10.2*  HCT 37.4 34.8* 33.4* 32.1* 32.4*  MCV 92.6 91.8 91.8 92.2 93.1  PLT 210 202 213 208 234   Cardiac Enzymes: No results for input(s): CKTOTAL, CKMB, CKMBINDEX, TROPONINI in the last 168 hours. BNP: Invalid input(s):  POCBNP CBG: Recent Labs  Lab 06/02/17 1614 06/02/17 2031 06/02/17 2324 06/03/17 0459 06/03/17 0800  GLUCAP 91 125* 136* 83 153*   D-Dimer No results for input(s): DDIMER in the last 72 hours. Hgb A1c Recent Labs    06/01/17 0538  HGBA1C 5.9*  Lipid Profile No results for input(s): CHOL, HDL, LDLCALC, TRIG, CHOLHDL, LDLDIRECT in the last 72 hours. Thyroid function studies Recent Labs    06/01/17 0538  TSH 0.358   Anemia work up No results for input(s): VITAMINB12, FOLATE, FERRITIN, TIBC, IRON, RETICCTPCT in the last 72 hours. Urinalysis    Component Value Date/Time   COLORURINE STRAW (A) 07/19/2016 1238   APPEARANCEUR CLEAR 07/19/2016 1238   LABSPEC 1.006 07/19/2016 1238   PHURINE 5.0 07/19/2016 1238   GLUCOSEU >=500 (A) 07/19/2016 1238   HGBUR NEGATIVE 07/19/2016 1238   BILIRUBINUR NEGATIVE 07/19/2016 1238   KETONESUR 5 (A) 07/19/2016 1238   PROTEINUR NEGATIVE 07/19/2016 1238   UROBILINOGEN 0.2 12/30/2008 1317   NITRITE POSITIVE (A) 07/19/2016 1238   LEUKOCYTESUR NEGATIVE 07/19/2016 1238   Sepsis Labs Invalid input(s): PROCALCITONIN,  WBC,  LACTICIDVEN Microbiology Recent Results (from the past 240 hour(s))  C difficile quick scan w PCR reflex     Status: Abnormal   Collection Time: 06/01/17  3:30 AM  Result Value [place] Status   C Diff antigen POSITIVE (A) NEGATIVE Final   C Diff toxin NEGATIVE NEGATIVE Final   C Diff interpretation Results are indeterminate. See PCR results.  Final    Comment: Performed at [facility], [place] [address] [place], [place] [address]  C. Diff by PCR, Reflexed     Status: Abnormal   Collection Time: 06/01/17  3:30 AM  Result Value [place] Status   Toxigenic C. Difficile by PCR POSITIVE (A) NEGATIVE Final    Comment: Positive for toxigenic C. difficile with little to no toxin production. Only treat if clinical presentation suggests symptomatic illness. Performed at [facility], [place] [address] [place], [place] [address]     Time coordinating discharge: 32 minutes  SIGNED:  Chipper Oman, MD  [facility] 06/03/2017, 11:20 AM  Pager please text page via  [email or web address]  Note - This record has been created using [employer]. Chart creation errors have been sought, but may not always have been located. Such creation errors do not reflect on the standard of medical care.

## 2017-06-03 NOTE — Telephone Encounter (Signed)
Noted. Since she was just discharged from the hospital, we will have her on meds that she was sent home on until she is seen in office on 06/19/17.

## 2017-06-03 NOTE — Progress Notes (Cosign Needed)
     [facility] Progress Note   Chief Complaint:   Abdominal pain / diarrhea / rectal bleeding.   SUBJECTIVE:    Overall feels better but not eating a lot. Some severe abdominal pain still at times when needs to have a BM. Patient feels like she could do okay at hom e   ASSESSMENT AND PLAN:   1. 57 yo female with UC, maintained on Asacol 4.8 grams daily. Admitted with abdominal pain, rectal bleeding. CT scan remarkable for wall thickening of left colon.  C-diff pcr is positive. Steroids stopped. Diarrhea possibly combination of chemo, UC, and C-diff. WBC has normalized. Afebrile.  -will need 10 d course of Vancomycin with florastor BID (30 days) -continue Asacol 4.8 grams daily -stable from GI standpoint for discharge. Will make her a follow up appt with [place].  -Asked nurse to print her a low fiber diet to follow until diarrhea resolves  2. Breast cancer, chemo recently completed.   OBJECTIVE:      Vital signs in last 24 hours: Temp:  [97.9 F (36.6 C)-98.6 F (37 C)] 98 F (36.7 C) (05/06 0501) Pulse Rate:  [68-81] 68 (05/06 0501) Resp:  [18] 18 (05/06 0501) BP: (117-137)/(75-89) 137/83 (05/06 0501) SpO2:  [99 %-100 %] 99 % (05/06 0501) Last BM Date: 06/02/17 General:   Alert, pleasant white female in NAD EENT:  Normal hearing, non icteric sclera, conjunctive pink.  Heart:  Regular rate and rhythm; no murmurs. No lower extremity edema Pulm: Normal respiratory effort, lungs CTA bilaterally without wheezes or crackles. Abdomen:  Soft, nondistended, nontender.  Normal bowel sounds, no masses felt. No hepatomegaly.    Neurologic:  Alert and  oriented x4;  grossly normal neurologically. Psych:  Pleasant, cooperative.  Normal mood and affect.   Intake/Output from previous day: 05/05 0701 - 05/06 0700 In: 660 [P.O.:660] Out: -  Intake/Output this shift: Total I/O In: 360 [P.O.:360] Out: -   Lab Results: Recent Labs    06/01/17 0538 06/01/17 1420  06/03/17 0221  WBC 13.1* 12.7* 7.3  HGB 10.7* 10.4* 10.2*  HCT 33.4* 32.1* 32.4*  PLT 213 208 234   BMET Recent Labs    05/31/17 1607 06/01/17 0538 06/03/17 0221  NA 139 138 141  K 3.3* 3.8 4.4  CL 107 106 109  CO2 21* 23 25  GLUCOSE 153* 177* 89  BUN 15 13 13   CREATININE 0.97 0.77 0.88  CALCIUM 8.5* 8.7* 8.4*   LFT Recent Labs    06/01/17 0538  PROT 6.4*  ALBUMIN 2.6*  AST 14*  ALT 13*  ALKPHOS 68  BILITOT 0.6    Discharge Planning Diet:low fiber Anticoagulation and antiplatelets: NA Discharge Medications: see A/P Follow up: 06/19/17 at 11:00 am with Tye Savoy, NP   Active Problems:   Breast cancer of upper-outer quadrant of left female breast ([place])   Hypokalemia   Dehydration   GI bleeding   Hypertension   Ulcerative colitis with rectal bleeding ([place])   Ulcerative colitis with complication ([place])     LOS: 3 days   Tye Savoy ,NP 06/03/2017, 9:14 AM  Pager number [phone]

## 2017-06-05 ENCOUNTER — Other Ambulatory Visit: Payer: Self-pay

## 2017-06-05 ENCOUNTER — Ambulatory Visit: Payer: 59 | Admitting: Gastroenterology

## 2017-06-05 DIAGNOSIS — C50412 Malignant neoplasm of upper-outer quadrant of left female breast: Secondary | ICD-10-CM

## 2017-06-05 MED ORDER — MESALAMINE 800 MG PO TBEC: 2.0000 | DELAYED_RELEASE_TABLET | Freq: Three times a day (TID) | ORAL | 0 refills | Status: DC

## 2017-06-05 MED ORDER — DICYCLOMINE HCL 20 MG PO TABS: 20.0000 mg | ORAL_TABLET | Freq: Three times a day (TID) | ORAL | 0 refills | Status: DC

## 2017-06-10 ENCOUNTER — Encounter (HOSPITAL_COMMUNITY): Payer: Self-pay | Admitting: Cardiology

## 2017-06-10 ENCOUNTER — Inpatient Hospital Stay (HOSPITAL_COMMUNITY)
Admission: RE | Admit: 2017-06-10 | Discharge: 2017-06-10 | Disposition: A | Discharge status: Home / Self Care | Payer: 59 | Source: Ambulatory Visit | Attending: Cardiology | Admitting: Cardiology

## 2017-06-10 ENCOUNTER — Ambulatory Visit (HOSPITAL_BASED_OUTPATIENT_CLINIC_OR_DEPARTMENT_OTHER)
Admission: RE | Admit: 2017-06-10 | Discharge: 2017-06-10 | Disposition: A | Discharge status: Home / Self Care | Payer: 59 | Source: Ambulatory Visit | Attending: Cardiology | Admitting: Cardiology

## 2017-06-10 VITALS — BP 140/68 | HR 75 | Wt 197.0 lb

## 2017-06-10 DIAGNOSIS — C50412 Malignant neoplasm of upper-outer quadrant of left female breast: Secondary | ICD-10-CM

## 2017-06-10 NOTE — Progress Notes (Signed)
  Echocardiogram 2D Echocardiogram has been performed.  Brandi Dickson 06/10/2017, 2:53 PM

## 2017-06-11 NOTE — Progress Notes (Signed)
Oncologist: [name]  57 yo referred by [name] with history of type II diabetes, HTN, ulcerative colitis and breast cancer presents for cardio-oncology followup.   Breast cancer was diagnosed 3/18, ER-/PR-/HER2+.  She had docetaxel/carboplatin/Herceptin/Perjeta on 05/11/16 for 4 cycles, stopped due to severe diarrhea/dehydration/UC flare.  Currently getting Herceptin/Perjeta every 3 wks and taking Xeloda.  She had lumpectomy in 9/18.   She has completed radiation.   Doing well symptomatically, no dyspnea or chest pain.  She has now finished Herceptin.  Labs (2/18): K 4.7, creatinine 0.76 Labs (7/18): K 3.8, creatinine 1.0  PMH: 1. Type II diabetes 2. HTN 3. Ulcerative colitis 4. Breast cancer: Diagnosed 3/18, ER-/PR-/HER2+.  She had docetaxel/carboplatin/Herceptin/Perjeta x 4 cycles.  Now getting q3 wk Herceptin/Perjeta as well as Xeloda.  Lumpectomy 9/18.    - Echo (4/18): EF 55-60%, GLS -13.3%, normal RV size and systolic function (technically difficult study).  - Echo (7/18): EF 60-65%, GLS -18.6%, normal RV size and systolic function.  - Echo (10/18): EF 55-60%, GLS -17.1% (difficult strain images), normal diastolic function, normal RV size and systolic function.  - Echo (2/19): EF 55-60%, GLS -18.6% - Echo (5/19): EF 55-60%, GLS -23%  Social History   Socioeconomic History  . Marital status: Married    Spouse name: Not on file  . Number of children: 0  . Years of education: Not on file  . Highest education level: Not on file  Occupational History    Employer: [employer]  . Financial resource strain: Not on file  . Food insecurity:    Worry: Not on file    Inability: Not on file  . Transportation needs:    Medical: Not on file    Non-medical: Not on file  Tobacco Use  . Smoking status: Former Smoker    Packs/day: 1.00    Years: 29.00    Pack years: 29.00    Types: Cigarettes    Last attempt to quit: 10/27/2002    Years since quitting: 14.6  . Smokeless  tobacco: Never Used  Substance and Sexual Activity  . Alcohol use: No    Alcohol/week: 0.0 oz  . Drug use: No  . Sexual activity: Yes    Birth control/protection: Surgical  Lifestyle  . Physical activity:    Days per week: Not on file    Minutes per session: Not on file  . Stress: Not on file  Relationships  . Social connections:    Talks on phone: Not on file    Gets together: Not on file    Attends religious service: Not on file    Active member of club or organization: Not on file    Attends meetings of clubs or organizations: Not on file    Relationship status: Not on file  . Intimate partner violence:    Fear of current or ex partner: Not on file    Emotionally abused: Not on file    Physically abused: Not on file    Forced sexual activity: Not on file  Other Topics Concern  . Not on file  Social History Narrative   Married   Former smoker   No ETOH, drug use   Family History  Problem Relation Age of Onset  . Colon cancer Father   . Stomach cancer Paternal Uncle   . Stomach cancer Paternal Uncle   . Melanoma Brother   . Thyroid cancer Brother   . Breast cancer Maternal Aunt   . Breast  cancer Maternal Aunt   . Breast cancer Cousin    ROS: All systems reviewed and negative except as per HPI.   Current Outpatient Medications  Medication Sig Dispense Refill  . albuterol (PROVENTIL HFA;VENTOLIN HFA) 108 (90 Base) MCG/ACT inhaler Inhale 1-2 puffs into the lungs every 6 (six) hours as needed for wheezing or shortness of breath.    . dicyclomine (BENTYL) 20 MG tablet Take 1 tablet (20 mg total) by mouth 4 (four) times daily -  before meals and at bedtime. 30 tablet 0  . gabapentin (NEURONTIN) 100 MG capsule Take 200 mg by mouth at bedtime.     . magnesium oxide (MAG-OX) 400 MG tablet Take 400 mg by mouth daily.     . Mesalamine 800 MG TBEC Take 2 tablets (1,600 mg total) by mouth 3 (three) times daily. 180 tablet 0  . metFORMIN (GLUCOPHAGE-XR) 500 MG 24 hr tablet Take  1,000 mg by mouth at bedtime.    . metoprolol (LOPRESSOR) 50 MG tablet Take 50 mg by mouth 2 (two) times daily. Takes 1 in the am and 1 at HS    . niacin 250 MG tablet Take 250 mg by mouth at bedtime.     . potassium chloride SA (KLOR-CON M20) 20 MEQ tablet Take 1 tablet (20 mEq total) by mouth 2 (two) times daily. 60 tablet 2  . saccharomyces boulardii (FLORASTOR) 250 MG capsule Take 1 capsule (250 mg total) by mouth 2 (two) times daily. 60 capsule 0  . triamterene-hydrochlorothiazide (MAXZIDE) 75-50 MG per tablet Take 0.5 tablets by mouth daily.     . vancomycin (VANCOCIN) 125 MG capsule Take 1 capsule (125 mg total) by mouth 4 (four) times daily for 8 days. 32 capsule 0   No current facility-administered medications for this encounter.    Facility-Administered Medications Ordered in Other Encounters  Medication Dose Route Frequency Provider Last Rate Last Dose  . heparin lock flush 100 unit/mL  500 Units Intracatheter Once PRN Truitt Merle, MD      . sodium chloride flush (NS) 0.9 % injection 10 mL  10 mL Intracatheter PRN Truitt Merle, MD      . sodium chloride flush (NS) 0.9 % injection 10 mL  10 mL Intracatheter PRN Truitt Merle, MD   10 mL at 08/24/16 1725  . sodium chloride flush (NS) 0.9 % injection 10 mL  10 mL Intracatheter PRN Truitt Merle, MD   10 mL at 11/16/16 1628     BP 140/68   Pulse 75   Wt 197 lb (89.4 kg)   SpO2 100%   BMI 33.81 kg/m  General: NAD Neck: No JVD, no thyromegaly or thyroid nodule.  Lungs: Clear to auscultation bilaterally with normal respiratory effort. CV: Nondisplaced PMI.  Heart regular S1/S2, no S3/S4, no murmur.  No peripheral edema.  No carotid bruit.  Normal pedal pulses.  Abdomen: Soft, nontender, no hepatosplenomegaly, no distention.  Skin: Intact without lesions or rashes.  Neurologic: Alert and oriented x 3.  Psych: Normal affect. Extremities: No clubbing or cyanosis.  HEENT: Normal.   Assessment/Plan: 1. Breast cancer: HER2+ breast cancer.  She  has completed Herceptin. EF and strain stable on today's echo. She will need no further screening echoes.     2. HTN: BP borderline elevated, has been controlled at home.   Followup prn   Loralie Champagne 06/11/2017

## 2017-06-19 ENCOUNTER — Ambulatory Visit: Payer: 59 | Admitting: Nurse Practitioner

## 2017-06-19 ENCOUNTER — Telehealth: Payer: Self-pay

## 2017-06-19 NOTE — Telephone Encounter (Signed)
LVM reminding pt of SCP visit with NP on 06/28/17 at 10 am.  Left center number for call back for questions.

## 2017-06-20 ENCOUNTER — Encounter: Payer: 59 | Admitting: Adult Health

## 2017-06-20 ENCOUNTER — Other Ambulatory Visit: Payer: 59

## 2017-06-28 ENCOUNTER — Inpatient Hospital Stay: Payer: 59

## 2017-06-28 ENCOUNTER — Telehealth: Payer: Self-pay | Admitting: Adult Health

## 2017-06-28 ENCOUNTER — Encounter: Payer: Self-pay | Admitting: Adult Health

## 2017-06-28 ENCOUNTER — Inpatient Hospital Stay: Payer: 59 | Attending: Hematology | Admitting: Adult Health

## 2017-06-28 VITALS — BP 123/73 | HR 75 | Temp 98.5°F | Resp 18 | Ht 64.0 in | Wt 192.4 lb

## 2017-06-28 DIAGNOSIS — Z171 Estrogen receptor negative status [ER-]: Secondary | ICD-10-CM | POA: Insufficient documentation

## 2017-06-28 DIAGNOSIS — C50412 Malignant neoplasm of upper-outer quadrant of left female breast: Secondary | ICD-10-CM | POA: Diagnosis present

## 2017-06-28 DIAGNOSIS — E2839 Other primary ovarian failure: Secondary | ICD-10-CM

## 2017-06-28 DIAGNOSIS — Z95828 Presence of other vascular implants and grafts: Secondary | ICD-10-CM

## 2017-06-28 DIAGNOSIS — K529 Noninfective gastroenteritis and colitis, unspecified: Secondary | ICD-10-CM | POA: Insufficient documentation

## 2017-06-28 LAB — COMPREHENSIVE METABOLIC PANEL
ALT: 14 U/L (ref 0–55)
AST: 15 U/L (ref 5–34)
Albumin: 3.4 g/dL — ABNORMAL LOW (ref 3.5–5.0)
Alkaline Phosphatase: 89 U/L (ref 40–150)
Anion gap: 12 — ABNORMAL HIGH (ref 3–11)
BILIRUBIN TOTAL: 0.6 mg/dL (ref 0.2–1.2)
BUN: 9 mg/dL (ref 7–26)
CALCIUM: 9.5 mg/dL (ref 8.4–10.4)
CHLORIDE: 96 mmol/L — AB (ref 98–109)
CO2: 29 mmol/L (ref 22–29)
CREATININE: 0.91 mg/dL (ref 0.60–1.10)
Glucose, Bld: 97 mg/dL (ref 70–140)
Potassium: 3.3 mmol/L — ABNORMAL LOW (ref 3.5–5.1)
Sodium: 137 mmol/L (ref 136–145)
Total Protein: 7.7 g/dL (ref 6.4–8.3)

## 2017-06-28 LAB — CBC WITH DIFFERENTIAL/PLATELET
BASOS ABS: 0.1 10*3/uL (ref 0.0–0.1)
Basophils Relative: 1 %
EOS PCT: 12 %
Eosinophils Absolute: 0.9 10*3/uL — ABNORMAL HIGH (ref 0.0–0.5)
HEMATOCRIT: 38.3 % (ref 34.8–46.6)
Hemoglobin: 12.4 g/dL (ref 11.6–15.9)
LYMPHS ABS: 1.7 10*3/uL (ref 0.9–3.3)
LYMPHS PCT: 21 %
MCH: 28.7 pg (ref 25.1–34.0)
MCHC: 32.4 g/dL (ref 31.5–36.0)
MCV: 88.4 fL (ref 79.5–101.0)
MONO ABS: 0.8 10*3/uL (ref 0.1–0.9)
Monocytes Relative: 10 %
NEUTROS ABS: 4.6 10*3/uL (ref 1.5–6.5)
Neutrophils Relative %: 56 %
PLATELETS: 244 10*3/uL (ref 145–400)
RBC: 4.34 MIL/uL (ref 3.70–5.45)
RDW: 16.1 % — ABNORMAL HIGH (ref 11.2–14.5)
WBC: 8.1 10*3/uL (ref 3.9–10.3)

## 2017-06-28 MED ORDER — SODIUM CHLORIDE 0.9% FLUSH
10.0000 mL | Freq: Once | INTRAVENOUS | Status: AC
  Administered 2017-06-28: 10 mL
  Filled 2017-06-28: qty 10

## 2017-06-28 MED ORDER — HEPARIN SOD (PORK) LOCK FLUSH 100 UNIT/ML IV SOLN
500.0000 [IU] | Freq: Once | INTRAVENOUS | Status: AC
  Administered 2017-06-28: 500 [IU]
  Filled 2017-06-28: qty 5

## 2017-06-28 MED ORDER — ANASTROZOLE 1 MG PO TABS: 1.0000 mg | ORAL_TABLET | Freq: Every day | ORAL | 5 refills | Status: DC

## 2017-06-28 NOTE — Telephone Encounter (Signed)
Gave patient AVs and calendar of upcoming appointments.  °

## 2017-06-28 NOTE — Patient Instructions (Signed)
Implanted Port Home Guide An implanted port is a type of central line that is placed under the skin. Central lines are used to provide IV access when treatment or nutrition needs to be given through a person's veins. Implanted ports are used for long-term IV access. An implanted port may be placed because:  You need IV medicine that would be irritating to the small veins in your hands or arms.  You need long-term IV medicines, such as antibiotics.  You need IV nutrition for a long period.  You need frequent blood draws for lab tests.  You need dialysis.  Implanted ports are usually placed in the chest area, but they can also be placed in the upper arm, the abdomen, or the leg. An implanted port has two main parts:  Reservoir. The reservoir is round and will appear as a small, raised area under your skin. The reservoir is the part where a needle is inserted to give medicines or draw blood.  Catheter. The catheter is a thin, flexible tube that extends from the reservoir. The catheter is placed into a large vein. Medicine that is inserted into the reservoir goes into the catheter and then into the vein.  How will I care for my incision site? Do not get the incision site wet. Bathe or shower as directed by your health care provider. How is my port accessed? Special steps must be taken to access the port:  Before the port is accessed, a numbing cream can be placed on the skin. This helps numb the skin over the port site.  Your health care provider uses a sterile technique to access the port. ? Your health care provider must put on a mask and sterile gloves. ? The skin over your port is cleaned carefully with an antiseptic and allowed to dry. ? The port is gently pinched between sterile gloves, and a needle is inserted into the port.  Only "non-coring" port needles should be used to access the port. Once the port is accessed, a blood return should be checked. This helps ensure that the port  is in the vein and is not clogged.  If your port needs to remain accessed for a constant infusion, a clear (transparent) bandage will be placed over the needle site. The bandage and needle will need to be changed every week, or as directed by your health care provider.  Keep the bandage covering the needle clean and dry. Do not get it wet. Follow your health care provider's instructions on how to take a shower or bath while the port is accessed.  If your port does not need to stay accessed, no bandage is needed over the port.  What is flushing? Flushing helps keep the port from getting clogged. Follow your health care provider's instructions on how and when to flush the port. Ports are usually flushed with saline solution or a medicine called heparin. The need for flushing will depend on how the port is used.  If the port is used for intermittent medicines or blood draws, the port will need to be flushed: ? After medicines have been given. ? After blood has been drawn. ? As part of routine maintenance.  If a constant infusion is running, the port may not need to be flushed.  How long will my port stay implanted? The port can stay in for as long as your health care provider thinks it is needed. When it is time for the port to come out, surgery will be   done to remove it. The procedure is similar to the one performed when the port was put in. When should I seek immediate medical care? When you have an implanted port, you should seek immediate medical care if:  You notice a bad smell coming from the incision site.  You have swelling, redness, or drainage at the incision site.  You have more swelling or pain at the port site or the surrounding area.  You have a fever that is not controlled with medicine.  This information is not intended to replace advice given to you by your health care provider. Make sure you discuss any questions you have with your health care provider. Document  Released: 01/15/2005 Document Revised: 06/23/2015 Document Reviewed: 09/22/2012 Elsevier Interactive Patient Education  2017 [employer]  

## 2017-06-28 NOTE — Progress Notes (Signed)
CLINIC:  Survivorship   REASON FOR VISIT:  Routine follow-up post-treatment for a recent history of breast cancer.  BRIEF ONCOLOGIC HISTORY:  Oncology History   Cancer Staging Breast cancer of upper-outer quadrant of left female breast [facility]) Staging form: Breast, AJCC 8th Edition - Clinical stage from 04/25/2016: Stage IIA (cT2, cN0, cM0, G3, ER: Negative, PR: Negative, HER2: Positive) - Signed by Truitt Merle, MD on 05/02/2016 - Pathologic stage from 10/09/2016: No Stage Recommended (ypT2, pN1a, cM0, G3, ER: Negative, PR: Negative, HER2: Positive) - Signed by Truitt Merle, MD on 10/26/2016       Breast cancer of upper-outer quadrant of left female breast ([place])   04/24/2016 Mammogram    Category B breasts with a new irregular mass in the UOQ left breast middle depth. Ultrasound revealed a 3.9 cm mass in the 1:00 position. The left axilla was negative.       04/25/2016 Initial Biopsy    Biopsy of the left breast showed grade 3 invasive ductal carcinoma, DCIS, and lymphovascular invasion was present.      04/25/2016 Receptors her2    ER 0% negative, PR 0% negative, HER2 positive, Ki67 30%      05/02/2016 Initial Diagnosis    Breast cancer of upper-outer quadrant of left female breast ([place])      05/07/2016 Imaging    MRI of the bilateral breast 05/07/16 IMPRESSION: Lobulated enhancing mass (4.0 x 2.7 x 3.1 cm) in the upper-outer quadrant of the left breast corresponding with the recently diagnosed invasive mammary carcinoma. Linear enhancement extends 2.6 cm posterior to the mass worrisome for ductal carcinoma in-situ.      05/09/2016 Echocardiogram    Echo 05/09/16 -LF EF: 55-60%      05/11/2016 - 08/31/2016 Neo-Adjuvant Chemotherapy    Cycle 1 Docetaxel, Carboplatin, Herceptin and pejeta (TCHP), cycle 2 Docetaxel changed to Taxol, carbo dose reduced to AUC 4.5 (from 5) due to severe diarrhea and cytopenia, Perjeta held after cycle 2.   Due to poor toleration, [name] was held after cycle  4 and Taxol changed to weekly X4. Herceptin was continued during the above chemo.        05/13/2016 Imaging    CT Angio Chest PE IMPRESSION: No evidence of pulmonary emboli. Left breast mass consistent with the given clinical history. Stable left adrenal lesion likely representing a small adenoma.      05/18/2016 - 05/24/2016 Hospital Admission    Patient presented with nausea, vomiting, and diarrhea; admitted to hospital with Hyponatremia      06/07/2016 - 06/09/2016 Hospital Admission    Patient presents to hospital complaints of rectal bleeding and abdominal cramps when defacating      06/07/2016 Imaging    CT ABDOMEN PELVIS W CONTRAST  IMPRESSION: 1. Wall thickening of the descending and sigmoid colon consistent with an infectious or inflammatory colitis. 2. There is also a short segment of narrowing and possible wall thickening of the superior ascending colon which could reflect a constricting mass or, more likely, be an area of persistent colonic spasm. This could be further assessed with either colonoscopy or a barium enema after the current symptoms of colitis have resolved. 3. Small, subcentimeter, low-density liver lesions which may all be benign. However, 3 these are not evident on prior CT. Liver metastatic disease possible. These could be further assessed with liver MRI with and without contrast. 4. Adrenal lesions which are stable, on the right and myelolipoma and on the left most likely an adenoma.  06/07/2016 Imaging    CT A/P IMPRESSION: 1. Wall thickening of the descending and sigmoid colon consistent with an infectious or inflammatory colitis. 2. There is also a short segment of narrowing and possible wall thickening of the superior ascending colon which could reflect a constricting mass or, more likely, be an area of persistent colonic spasm. This could be further assessed with either colonoscopy or a barium enema after the current symptoms of colitis  have resolved. 3. Small, subcentimeter, low-density liver lesions which may all be benign. However, 3 these are not evident on prior CT. Liver metastatic disease possible. These could be further assessed with liver MRI with and without contrast. 4. Adrenal lesions which are stable, on the right and myelolipoma and on the left most likely an adenoma.      06/07/2016 - 06/09/2016 Hospital Admission    Diarrhea and rectal Bleeding      07/19/2016 - 07/20/2016 Hospital Admission    Diarrhea and dehydration      08/09/2016 Mammogram    Mammogram 08/09/16 IMPRESSION:  The 3.1 cm x 2 cm x 1.5 cm irregular equal density mass in the left breast is consistent with the known carcinoma showing mammographic evidence of preoperative chemotherapy response.         08/09/2016 Imaging    [place] of left breast 08/09/16 IMPRESSION:  The 2.7 cm lobulated mass in the left breast is a known biopsy positive for malignancy. Surgical and oncology consult in progress.      08/16/2016 Imaging    MRI Abdomen W WO Contrast IMPRESSION: Tiny sub-cm hepatic cysts. No evidence of metastatic disease or other acute findings. Tiny benign left adrenal adenoma and right adrenal myelolipoma.      09/14/2016 -  Chemotherapy    Maintenance Herceptin every 3 weeks. Added Perjeta back on 10/26/16      09/17/2016 Imaging    MRI Breast Bilateral 09/17/16 IMPRESSION: Smaller left breast mass, now measuring 2.5 cm. No new or suspicious enhancement in either breast.  RECOMMENDATION: Treatment plan.      10/09/2016 Surgery    LEFT BREAST LUMPECTOMY WITH RADIOACTIVE SEED AND L4EFT AXILLARY SENTINEL LYMPH NODE BIOPSY by Dr. Lucia Gaskins on 10/09/16      10/09/2016 Pathology Results    Diagnosis 10/09/16 1. Breast, lumpectomy, Left - INVASIVE DUCTAL CARCINOMA, GRADE 3, SPANNING 2.4 CM. - HIGH GRADE DUCTAL CARCINOMA IN SITU WITH NECROSIS. - RESECTION MARGINS ARE NEGATIVE FOR CARCINOMA. - BIOPSY SITE. - SEE ONCOLOGY TABLE. 2.  Breast, excision, Left additional medial margin - FIBROCYSTIC CHANGE. - NO MALIGNANCY IDENTIFIED. 3. Lymph node, sentinel, biopsy, Left Axillary - METASTATIC CARCINOMA IN ONE OF ONE LYMPH NODES (1/1).        11/22/2016 - 01/10/2017 Radiation Therapy    Radiation with Dr. Sondra Come and complete on 01/10/17      11/22/2016 -  Chemotherapy    Xeloda With radiation she will be on low dose 4 tablets in the am and 3 tablets in the pm. Starting 11/22/16 and complete on 01/10/17.   Will continue Xeloda, 14 days on and 7 days off, 4 tablets BID starting 01/18/17       03/12/2017 Echocardiogram    ECHO 03/12/17 Impressions: - Normal LV size with EF 55-60%. Strain as above. Normal RV size   and systolic function. No significant valvular abnormalities.      04/18/2017 Mammogram    IMPRESSION:  New post treatment changes of the left breast. There is no mammographic evidence of malignancy. A follow-up  mammogram in 12 months is recommended.        INTERVAL HISTORY:  Ms. Ragen presents to the [facility] today for our initial meeting to review her survivorship care plan detailing her treatment course for breast cancer, as well as monitoring long-term side effects of that treatment, education regarding health maintenance, screening, and overall wellness and health promotion.     Overall, Ms. Anglada reports feeling quite well.  She has had a rough treatment time period due to colitis and multiple admissions during her therapy.  She has had difficulty with her GI system.  She just finished up with Capecitabine about a month ago.  She says her bowel movements are still like pudding.      REVIEW OF SYSTEMS:  Review of Systems  Constitutional: Negative for appetite change, chills, fatigue, fever and unexpected weight change.  HENT:   Negative for hearing loss and lump/mass.   Eyes: Negative for eye problems and icterus.  Respiratory: Negative for chest tightness, cough and shortness of  breath.   Cardiovascular: Negative for chest pain and leg swelling.  Gastrointestinal: Negative for abdominal distention, abdominal pain, constipation, diarrhea, nausea and vomiting.  Endocrine: Negative for hot flashes.  Skin: Negative for itching and rash.  Neurological: Negative for dizziness, extremity weakness, headaches and numbness.  Hematological: Negative for adenopathy. Does not bruise/bleed easily.  Psychiatric/Behavioral: Negative for depression. The patient is not nervous/anxious.    Breast: Denies any new nodularity, masses, tenderness, nipple changes, or nipple discharge.      ONCOLOGY TREATMENT TEAM:  1. Surgeon:  Dr. Lucia Gaskins at [facility] 2. Medical Oncologist: [name]  3. Radiation Oncologist: Dr. Sondra Come    PAST MEDICAL/SURGICAL HISTORY:  Past Medical History:  Diagnosis Date  . Arthritis   . Asthma    triggered with [name] perfumes and cigarette smoke  . Cancer ([place])   . Colitis   . Diabetes mellitus without complication ([place])   . History of radiation therapy 11/22/16-01/10/17   left breast 50.4 Gy in 28 fractions, axillary region 45 Gy in 25 fractions, lumpectomy cavity boost 10 Gy tin 5 fractions  . Hypertension   . Neuropathy    Past Surgical History:  Procedure Laterality Date  . ABDOMINAL HYSTERECTOMY    . BREAST LUMPECTOMY WITH RADIOACTIVE SEED AND SENTINEL LYMPH NODE BIOPSY Left 10/09/2016   Procedure: LEFT BREAST LUMPECTOMY WITH RADIOACTIVE SEED AND L4EFT AXILLARY SENTINEL LYMPH NODE BIOPSY;  Surgeon: [name], MD;  Location: [place];  Service: General;  Laterality: Left;  . DILATION AND CURETTAGE OF UTERUS    . KNEE ARTHROSCOPY Left   . PORTACATH PLACEMENT Right 05/08/2016   Procedure: INSERTION PORT-A-CATH WITH [place];  Surgeon: [name], MD;  Location: [place];  Service: General;  Laterality: Right;  . TONSILLECTOMY    . TOTAL HIP ARTHROPLASTY Right   . TOTAL HIP ARTHROPLASTY Left  03/06/2016   Procedure: LEFT TOTAL HIP ARTHROPLASTY ANTERIOR APPROACH;  Surgeon: [name], MD;  Location: [facility];  Service: Orthopedics;  Laterality: Left;     ALLERGIES:  Allergies  Allergen Reactions  . Lisinopril Palpitations  . Augmentin [Amoxicillin-Pot Clavulanate] Other (See Comments)    sts gives her a yeast infection Has patient had a PCN reaction causing immediate rash, facial/tongue/throat swelling, SOB or lightheadedness with hypotension: no Has patient had a PCN reaction causing severe rash involving mucus membranes or skin necrosis: no Has patient had a PCN reaction that required hospitalization no Has patient  had a PCN reaction occurring within the last 10 years: unknown If all of the above answers are "NO", then may proceed with Cephalosporin use.   . Benadryl [Diphenhydramine] Itching and Anxiety    Per pt: "Makes my skin crawl"; makes pt sensitive to touch  . Losartan Potassium Palpitations     CURRENT MEDICATIONS:  Outpatient Encounter Medications as of 06/28/2017  Medication Sig  . albuterol (PROVENTIL HFA;VENTOLIN HFA) 108 (90 Base) MCG/ACT inhaler Inhale 1-2 puffs into the lungs every 6 (six) hours as needed for wheezing or shortness of breath.  . dicyclomine (BENTYL) 20 MG tablet Take 1 tablet (20 mg total) by mouth 4 (four) times daily -  before meals and at bedtime.  . gabapentin (NEURONTIN) 100 MG capsule Take 200 mg by mouth at bedtime.   . magnesium oxide (MAG-OX) 400 MG tablet Take 400 mg by mouth daily.   . Mesalamine 800 MG TBEC Take 2 tablets (1,600 mg total) by mouth 3 (three) times daily.  . metFORMIN (GLUCOPHAGE-XR) 500 MG 24 hr tablet Take 1,000 mg by mouth at bedtime.  . metoprolol (LOPRESSOR) 50 MG tablet Take 50 mg by mouth 2 (two) times daily. Takes 1 in the am and 1 at HS  . niacin 250 MG tablet Take 250 mg by mouth at bedtime.   . potassium chloride SA (KLOR-CON M20) 20 MEQ tablet Take 1 tablet (20 mEq total) by mouth 2 (two) times daily.    [name] Kitchen saccharomyces boulardii (FLORASTOR) 250 MG capsule Take 1 capsule (250 mg total) by mouth 2 (two) times daily.  [name] Kitchen triamterene-hydrochlorothiazide (MAXZIDE) 75-50 MG per tablet Take 0.5 tablets by mouth daily.   [name] Kitchen anastrozole (ARIMIDEX) 1 MG tablet Take 1 tablet (1 mg total) by mouth daily.   Facility-Administered Encounter Medications as of 06/28/2017  Medication  . heparin lock flush 100 unit/mL  . sodium chloride flush (NS) 0.9 % injection 10 mL  . sodium chloride flush (NS) 0.9 % injection 10 mL  . sodium chloride flush (NS) 0.9 % injection 10 mL     ONCOLOGIC FAMILY HISTORY:  Family History  Problem Relation Age of Onset  . Colon cancer Father   . Stomach cancer Paternal Uncle   . Stomach cancer Paternal Uncle   . Melanoma Brother   . Thyroid cancer Brother   . Breast cancer Maternal Aunt   . Breast cancer Maternal Aunt   . Breast cancer Cousin        SOCIAL HISTORY:  Social History   Socioeconomic History  . Marital status: Married    Spouse name: Not on file  . Number of children: 0  . Years of education: Not on file  . Highest education level: Not on file  Occupational History    Employer: [employer]  . Financial resource strain: Not on file  . Food insecurity:    Worry: Not on file    Inability: Not on file  . Transportation needs:    Medical: Not on file    Non-medical: Not on file  Tobacco Use  . Smoking status: Former Smoker    Packs/day: 1.00    Years: 29.00    Pack years: 29.00    Types: Cigarettes    Last attempt to quit: 10/27/2002    Years since quitting: 14.6  . Smokeless tobacco: Never Used  Substance and Sexual Activity  . Alcohol use: No    Alcohol/week: 0.0 oz  . Drug use: No  . Sexual activity:  Yes    Birth control/protection: Surgical  Lifestyle  . Physical activity:    Days per week: Not on file    Minutes per session: Not on file  . Stress: Not on file  Relationships  . Social connections:    Talks on phone:  Not on file    Gets together: Not on file    Attends religious service: Not on file    Active member of club or organization: Not on file    Attends meetings of clubs or organizations: Not on file    Relationship status: Not on file  . Intimate partner violence:    Fear of current or ex partner: Not on file    Emotionally abused: Not on file    Physically abused: Not on file    Forced sexual activity: Not on file  Other Topics Concern  . Not on file  Social History Narrative   Married   Former smoker   No ETOH, drug use      PHYSICAL EXAMINATION:  Vital Signs:   Vitals:   06/28/17 0949  BP: 123/73  Pulse: 75  Resp: 18  Temp: 98.5 F (36.9 C)  SpO2: 99%   Filed Weights   06/28/17 0949  Weight: 192 lb 6.4 oz (87.3 kg)   General: Well-nourished, well-appearing female in no acute distress.  She is accompanied by her husband today.   HEENT: Head is normocephalic.  Pupils equal and reactive to light. Conjunctivae clear without exudate.  Sclerae anicteric. Oral mucosa is pink, moist.  Oropharynx is pink without lesions or erythema.  Lymph: No cervical, supraclavicular, or infraclavicular lymphadenopathy noted on palpation.  Cardiovascular: Regular rate and rhythm.[name] Kitchen Respiratory: Clear to auscultation bilaterally. Chest expansion symmetric; breathing non-labored.  GI: Abdomen soft and round; non-tender, non-distended. Bowel sounds normoactive.  GU: Deferred.  Neuro: No focal deficits. Steady gait.  Psych: Mood and affect normal and appropriate for situation.  Extremities: No edema. MSK: No focal spinal tenderness to palpation.  Full range of motion in bilateral upper extremities Skin: Warm and dry.  LABORATORY DATA:  None for this visit.  DIAGNOSTIC IMAGING:  None for this visit.      ASSESSMENT AND PLAN:  Ms.. Pascarella is a pleasant 57 y.o. female with Stage IIA left breast invasive ductal carcinoma, ER-/PR-/HER2+, diagnosed in 04/2016, treated with neoadjuvant  chemotherapy, lumpectomy, adjuvant radiation therapy with Capecitabine sensitization, and anti-estrogen therapy with Anastrozole to potentially start soon.  She presents to the Survivorship Clinic for our initial meeting and routine follow-up post-completion of treatment for breast cancer.    1. Stage IIB left breast cancer:  Ms. Medellin is continuing to recover from definitive treatment for breast cancer. She will follow-up with her medical oncologist, [name] in three months with history and physical exam per surveillance protocol.  I reviewed anti estrogen therapies with Marybeth today, particularly Anastrozole which I prescribed for her.  I reviewed risks/benefits in detail. Today, a comprehensive survivorship care plan and treatment summary was reviewed with the patient today detailing her breast cancer diagnosis, treatment course, potential late/long-term effects of treatment, appropriate follow-up care with recommendations for the future, and patient education resources.  A copy of this summary, along with a letter will be sent to the patient's primary care provider via mail/fax/In Basket message after today's visit.    2. Colitis issues: I assured Chandlar that we would continue to refill her Lomotil if she needs it, as she has continued to have diarrhea. I told her  however, that the goal would be that her lomotil need decrease over time, and if it doesn't appear that is happening, we will need to defer to Dr. Fuller Plan for assistance.   3. Bone health:  Given Ms. Russomanno's age/history of breast cancer and her current treatment regimen including anti-estrogen therapy with Anastrozole, she is at risk for bone demineralization.  Gertrude has never undergone bone density testing, so I ordered for this to be done at [facility].  In the meantime, she was encouraged to increase her consumption of foods rich in calcium, as well as increase her weight-bearing activities.  She was given education on specific activities  to promote bone health.  4. Cancer screening:  Due to Ms. Laprise's history and her age, she should receive screening for skin cancers, colon cancer, and gynecologic cancers.  The information and recommendations are listed on the patient's comprehensive care plan/treatment summary and were reviewed in detail with the patient.    5. Health maintenance and wellness promotion: Ms. Ozier was encouraged to consume 5-7 servings of fruits and vegetables per day. We reviewed the "Nutrition Rainbow" handout, as well as the handout "Take Control of Your Health and Reduce Your Cancer Risk" from the [employer].  She was also encouraged to engage in moderate to vigorous exercise for 30 minutes per day most days of the week. We discussed the [employer] fitness program, which is designed for cancer survivors to help them become more physically fit after cancer treatments.  She was instructed to limit her alcohol consumption and continue to abstain from tobacco use.     6. Support services/counseling: It is not uncommon for this period of the patient's cancer care trajectory to be one of many emotions and stressors.  We discussed an opportunity for her to participate in the next session of [facility] ("Finding Your New Normal") support group series designed for patients after they have completed treatment.   Ms. Balow was encouraged to take advantage of our many other support services programs, support groups, and/or counseling in coping with her new life as a cancer survivor after completing anti-cancer treatment.  She was offered support today through active listening and expressive supportive counseling.  She was given information regarding our available services and encouraged to contact me with any questions or for help enrolling in any of our support group/programs.    Dispo:   -Return to cancer center for f/u with [name] in 08/2017 -Mammogram due in 03/2018 -Bone density due -Follow up with Dr.  Lucia Gaskins in 06/2017 -She is welcome to return back to the Survivorship Clinic at any time; no additional follow-up needed at this time.  -Consider referral back to survivorship as a long-term survivor for continued surveillance  A total of (30) minutes of face-to-face time was spent with this patient with greater than 50% of that time in counseling and care-coordination.   Gardenia Phlegm, [place] [phone]   Note: PRIMARY CARE PROVIDER Orpah Melter, [place] [phone]

## 2017-07-02 ENCOUNTER — Other Ambulatory Visit: Payer: Self-pay

## 2017-07-02 DIAGNOSIS — C50412 Malignant neoplasm of upper-outer quadrant of left female breast: Secondary | ICD-10-CM

## 2017-07-02 DIAGNOSIS — Z171 Estrogen receptor negative status [ER-]: Principal | ICD-10-CM

## 2017-07-02 MED ORDER — ANASTROZOLE 1 MG PO TABS: 1.0000 mg | ORAL_TABLET | Freq: Every day | ORAL | 5 refills | Status: DC

## 2017-07-05 ENCOUNTER — Telehealth: Payer: Self-pay

## 2017-07-05 NOTE — Telephone Encounter (Signed)
Patient calling states she saw Mendel Ryder NP on 5/31 however has a lot of questions and feels like she needs an appointment with [name].

## 2017-07-08 ENCOUNTER — Other Ambulatory Visit: Payer: Self-pay | Admitting: Hematology

## 2017-07-08 NOTE — Telephone Encounter (Signed)
Left message for pt to call back tomorrow to see if she still needs rf on lomotil.

## 2017-07-09 ENCOUNTER — Other Ambulatory Visit: Payer: Self-pay | Admitting: Nurse Practitioner

## 2017-07-09 ENCOUNTER — Ambulatory Visit: Payer: 59 | Admitting: Nurse Practitioner

## 2017-07-09 ENCOUNTER — Encounter: Payer: Self-pay | Admitting: Nurse Practitioner

## 2017-07-09 VITALS — BP 110/68 | HR 80 | Ht 64.0 in | Wt 195.5 lb

## 2017-07-09 DIAGNOSIS — A0472 Enterocolitis due to Clostridium difficile, not specified as recurrent: Secondary | ICD-10-CM | POA: Diagnosis not present

## 2017-07-09 DIAGNOSIS — R197 Diarrhea, unspecified: Secondary | ICD-10-CM

## 2017-07-09 DIAGNOSIS — K51919 Ulcerative colitis, unspecified with unspecified complications: Secondary | ICD-10-CM | POA: Diagnosis not present

## 2017-07-09 MED ORDER — DIPHENOXYLATE-ATROPINE 2.5-0.025 MG PO TABS: ORAL_TABLET | ORAL | 0 refills | Status: DC

## 2017-07-09 NOTE — Progress Notes (Signed)
IMPRESSION and PLAN:    #51.  57 year old female with ulcerative colitis, right-sided involvement on last colonoscopy with biopsies in 2017.  Maintained on Asacol 1.6 g 3 times daily.  -She was having normal bowel movements 1-2 times a day prior to starting chemo April 2018.[name] Kitchen Since then her bowel habits have changed to having several loose stools a day.  She manages pretty well with Imodium and Lomotil.  Patient is hopeful that as time goes on her bowel habits will return to pre-chemo state and wants to give it more time before talking about change/escalation in UC therapy -She will follow-up with Dr. Fuller Plan in 6 months     #2.  C. difficile infection (PCR positive), hospitalized 4 days this month for 4-days.  Hospital records reviewed.  CT scan showed inflammatory changes in the left colon. Completed vancomycin and bowel movements are back to her baseline(pre-chemo baseline).  CT findings may have been related to underlying UC and/or C. difficile -Patient is taken to [facility] a day.  I encouraged her to continue Florastor for the next 1 to 2 months -She will call us ASAP for increased diarrhea, abdominal pain or fever.       HPI:    Chief Complaint: hospital follow up    Patient is a 57 yo female known to Dr. Fuller Plan for hx of UC.  Last colonoscopy with biopsies 2017 showed mild to moderate chronic active colitis from rectum to hepatic flexure.  She is maintained on Asasol call 1.6 g 3 times daily.  Patient has a history of breast cancer, she started chemo April 2018, finished April of this year.  Prior to starting chemo she would have 1-2 formed bowel movements a day.  Since being on chemo her bowel movements have changed dramatically.  She has a few loose stools in the morning.  Generally takes Imodium and will have a couple more small volume but loose stools in the afternoon and in the evening.  She sometimes takes Lomotil at night.  Deending what she eats bowel movements may be more  frequent.   Brandi Dickson was just hospitalized with C-diff (Positive PCR) Symptoms started with an episode of severe urgent diarrhea with  blood. Only one episode of blood. She went to hospital, WBC 18K. CT scan remarkable for wall thickening / inflammatory changes involving the left colon.  She has completed oral vancomycin.  Bowel movements are back to baseline (pre-chemotherapy baseline).  She feels fine.  No abdominal pain or fevers.  She continues with efforts to lose weight.  She is down from 212 pounds in March to 195 pounds today   Review of systems:     No chest pain, no SOB, no fevers, no urinary sx   Past Medical History:  Diagnosis Date  . Arthritis   . Asthma    triggered with Brandi Dickson perfumes and cigarette smoke  . Cancer ([place])   . Colitis   . Diabetes mellitus without complication ([place])   . History of radiation therapy 11/22/16-01/10/17   left breast 50.4 Gy in 28 fractions, axillary region 45 Gy in 25 fractions, lumpectomy cavity boost 10 Gy tin 5 fractions  . Hypertension   . Neuropathy     Patient's surgical history, family medical history, social history, medications and allergies were all reviewed in Epic   Serum creatinine: 0.91 mg/dL 06/28/17 1056 Estimated creatinine clearance: 72.7 mL/min   Physical Exam:     BP 110/68   Pulse 80  Ht 5\' 4"  (1.626 m)   Wt 195 lb 8 oz (88.7 kg)   BMI 33.56 kg/m   GENERAL:  Pleasant female in NAD PSYCH: : Cooperative, normal affect EENT:  conjunctiva pink, mucous membranes moist, neck supple without masses CARDIAC:  RRR, murmur heard, no peripheral edema PULM: Normal respiratory effort, lungs CTA bilaterally, no wheezing ABDOMEN:  Nondistended, soft, nontender. No obvious masses, no hepatomegaly,  normal bowel sounds SKIN:  turgor, no lesions seen Musculoskeletal:  Normal muscle tone, normal strength NEURO: Alert and oriented x 3, no focal neurologic deficits   Brandi Dickson , NP 07/09/2017, 8:48 AM

## 2017-07-09 NOTE — Patient Instructions (Signed)
If you are age 59 or older, your body mass index should be between 23-30. Your Body mass index is 33.56 kg/m. If this is out of the aforementioned range listed, please consider follow up with your Primary Care Provider.  If you are age 9 or younger, your body mass index should be between 19-25. Your Body mass index is 33.56 kg/m. If this is out of the aformentioned range listed, please consider follow up with your Primary Care Provider.   Follow up with Dr. Fuller Plan in 6 months.  You will be placed on recall for a follow up appointment at that time.  Thank you for choosing me and [facility].   Tye Savoy, NP

## 2017-07-11 NOTE — Progress Notes (Signed)
[place]  [phone] [phone]) [phone]  Clinic Follow Up Note   Patient Care Team: Orpah Melter, MD as PCP - General (Family Medicine) [name], MD as Consulting Physician (General Surgery) Truitt Merle, MD as Consulting Physician (Hematology) Gery Pray, MD as Consulting Physician (Radiation Oncology) Ladene Artist, MD as Consulting Physician (Gastroenterology) Delice Bison Charlestine Massed, NP as Nurse Practitioner (Hematology and Oncology)   Date of Service:  07/12/2017   CHIEF COMPLAINTS:  Follow up left breast cancer  Oncology History   Cancer Staging Breast cancer of upper-outer quadrant of left female breast [facility]) Staging form: Breast, AJCC 8th Edition - Clinical stage from 04/25/2016: Stage IIA (cT2, cN0, cM0, G3, ER: Negative, PR: Negative, HER2: Positive) - Signed by Truitt Merle, MD on 05/02/2016 - Pathologic stage from 10/09/2016: No Stage Recommended (ypT2, pN1a, cM0, G3, ER+, PR-, HER2+) - Signed by Truitt Merle, MD on 07/11/2017       Breast cancer of upper-outer quadrant of left female breast ([place])   04/24/2016 Mammogram    Category B breasts with a new irregular mass in the UOQ left breast middle depth. Ultrasound revealed a 3.9 cm mass in the 1:00 position. The left axilla was negative.       04/25/2016 Initial Biopsy    Biopsy of the left breast showed grade 3 invasive ductal carcinoma, DCIS, and lymphovascular invasion was present.      04/25/2016 Receptors her2    ER 0% negative, PR 0% negative, HER2 positive, Ki67 30%      05/02/2016 Initial Diagnosis    Breast cancer of upper-outer quadrant of left female breast ([place])      05/07/2016 Imaging    MRI of the bilateral breast 05/07/16 IMPRESSION: Lobulated enhancing mass (4.0 x 2.7 x 3.1 cm) in the upper-outer quadrant of the left breast corresponding with the recently diagnosed invasive mammary carcinoma. Linear enhancement extends 2.6 cm posterior to the mass worrisome for  ductal carcinoma in-situ.      05/09/2016 Echocardiogram    Echo 05/09/16 -LF EF: 55-60%      05/11/2016 - 08/31/2016 Neo-Adjuvant Chemotherapy    Cycle 1 Docetaxel, Carboplatin, Herceptin and pejeta (TCHP), cycle 2 Docetaxel changed to Taxol, carbo dose reduced to AUC 4.5 (from 5) due to severe diarrhea and cytopenia, Perjeta held after cycle 2.   Due to poor toleration, [name] was held after cycle 4 and Taxol changed to weekly X4. Herceptin was continued during the above chemo.        05/13/2016 Imaging    CT Angio Chest PE IMPRESSION: No evidence of pulmonary emboli. Left breast mass consistent with the given clinical history. Stable left adrenal lesion likely representing a small adenoma.      05/18/2016 - 05/24/2016 Hospital Admission    Patient presented with nausea, vomiting, and diarrhea; admitted to hospital with Hyponatremia      06/07/2016 - 06/09/2016 Hospital Admission    Patient presents to hospital complaints of rectal bleeding and abdominal cramps when defacating      06/07/2016 Imaging    CT ABDOMEN PELVIS W CONTRAST  IMPRESSION: 1. Wall thickening of the descending and sigmoid colon consistent with an infectious or inflammatory colitis. 2. There is also a short segment of narrowing and possible wall thickening of the superior ascending colon which could reflect a constricting mass or, more likely, be an area of persistent colonic spasm. This could be further assessed with either colonoscopy or a barium enema after the current symptoms of colitis  have resolved. 3. Small, subcentimeter, low-density liver lesions which may all be benign. However, 3 these are not evident on prior CT. Liver metastatic disease possible. These could be further assessed with liver MRI with and without contrast. 4. Adrenal lesions which are stable, on the right and myelolipoma and on the left most likely an adenoma.      06/07/2016 Imaging    CT A/P IMPRESSION: 1. Wall thickening of  the descending and sigmoid colon consistent with an infectious or inflammatory colitis. 2. There is also a short segment of narrowing and possible wall thickening of the superior ascending colon which could reflect a constricting mass or, more likely, be an area of persistent colonic spasm. This could be further assessed with either colonoscopy or a barium enema after the current symptoms of colitis have resolved. 3. Small, subcentimeter, low-density liver lesions which may all be benign. However, 3 these are not evident on prior CT. Liver metastatic disease possible. These could be further assessed with liver MRI with and without contrast. 4. Adrenal lesions which are stable, on the right and myelolipoma and on the left most likely an adenoma.      06/07/2016 - 06/09/2016 Hospital Admission    Diarrhea and rectal Bleeding      07/19/2016 - 07/20/2016 Hospital Admission    Diarrhea and dehydration      08/09/2016 Mammogram    Mammogram 08/09/16 IMPRESSION:  The 3.1 cm x 2 cm x 1.5 cm irregular equal density mass in the left breast is consistent with the known carcinoma showing mammographic evidence of preoperative chemotherapy response.         08/09/2016 Imaging    [place] of left breast 08/09/16 IMPRESSION:  The 2.7 cm lobulated mass in the left breast is a known biopsy positive for malignancy. Surgical and oncology consult in progress.      08/16/2016 Imaging    MRI Abdomen W WO Contrast IMPRESSION: Tiny sub-cm hepatic cysts. No evidence of metastatic disease or other acute findings. Tiny benign left adrenal adenoma and right adrenal myelolipoma.      09/14/2016 - 05/10/2017 Chemotherapy    Maintenance Herceptin every 3 weeks. Added Perjeta back on 10/26/16. Completed on 05/10/17      09/17/2016 Imaging    MRI Breast Bilateral 09/17/16 IMPRESSION: Smaller left breast mass, now measuring 2.5 cm. No new or suspicious enhancement in either breast.  RECOMMENDATION: Treatment  plan.      10/09/2016 Surgery    LEFT BREAST LUMPECTOMY WITH RADIOACTIVE SEED AND L4EFT AXILLARY SENTINEL LYMPH NODE BIOPSY by Dr. Lucia Gaskins on 10/09/16      10/09/2016 Pathology Results    Diagnosis 10/09/16 1. Breast, lumpectomy, Left - INVASIVE DUCTAL CARCINOMA, GRADE 3, SPANNING 2.4 CM. - HIGH GRADE DUCTAL CARCINOMA IN SITU WITH NECROSIS. - RESECTION MARGINS ARE NEGATIVE FOR CARCINOMA. - BIOPSY SITE. - SEE ONCOLOGY TABLE. 2. Breast, excision, Left additional medial margin - FIBROCYSTIC CHANGE. - NO MALIGNANCY IDENTIFIED. 3. Lymph node, sentinel, biopsy, Left Axillary - METASTATIC CARCINOMA IN ONE OF ONE LYMPH NODES (1/1).        11/22/2016 - 01/10/2017 Radiation Therapy    Radiation with Dr. Sondra Come and complete on 01/10/17      11/22/2016 - 05/15/2017 Chemotherapy    Xeloda With radiation she will be on low dose 4 tablets in the am and 3 tablets in the pm. Starting 11/22/16 and complete on 01/10/17.   Will continue Xeloda, 14 days on and 7 days off, 4 tablets BID starting 01/18/17  and completed on 05/15/17.  -Due to severe diarrhea, her Xeloda dose was decreased from 2000 mg twice daily to 2060m in am and 15074min pm on 02/11/17.        03/12/2017 Echocardiogram    ECHO 03/12/17 Impressions: - Normal LV size with EF 55-60%. Strain as above. Normal RV size   and systolic function. No significant valvular abnormalities.      04/18/2017 Mammogram    IMPRESSION:  New post treatment changes of the left breast. There is no mammographic evidence of malignancy. A follow-up mammogram in 12 months is recommended.       06/2017 -  Anti-estrogen oral therapy    Anastrozole 56m66maily, starting 06/2017       HISTORY OF PRESENTING ILLNESS (05/02/16):  Brandi Dickson 62o. female is here because of a new diagnosis of left breast cancer. She is accompanied by her husband to our multidisciplinary breast clinic today.  The patient presented with a palpable left breast lump  approximately 2 weeks ago. Bilateral diagnostic mammogram on 04/24/16 showed Category B breasts with a new irregular mass in the UOQ left breast middle depth. Ultrasound performed on 04/24/16 revealed a 3.9 cm mass in the 1:00 position. The left axilla was negative.  Biopsy of the left breast on 04/25/16 showed grade 3 invasive ductal carcinoma, DCIS, and lymphovascular invasion was present (ER 0% negative, PR 0% negative, HER2 positive, Ki67 30%).  She denies tenderness of the biopsied area. Reports minor dimpling. The patient is 8 weeks out from a left hip replacement. She had a right hip replacement 2010 years ago.  The patient and her husband present today in multidisciplinary breast clinic to discuss treatment options for the management of her disease.  The patient is taking Neurontin for neuropathy in her feet from diabetes. She has been diagnosed for diabetes for the past 4 years. She states she only has neuropathy at night and take 2 Neurontin at night and then has no symptoms.  GYN HISTORY  Menarchal: 9 LMP: Complete hysterectomy for uterine fibroids ~ 2009. She was bleeding too much with her last menstrual cycle lasting 6 weeks. No issues with menopause. Contraceptive: no  HRT: No GP: G1P0  CURRENT THERAPY:   Anastrozole 56mg30mily, starting 06/2017   INTERVAL HISTORY:   RebeUNDINE NEALISurns for follow up for her left breast cancer and discuss antiestrogen therapy. Of note since she was last seen by me she was hospitalized on 05/31/17 for lower GI bleed likely related to her ulcerative colitis and was treated with antibiotics. She also attended Survivorship clinic in 05/2017.   She presents to the clinic today noting she was seen by NP LindMendel Ryder was not satisfied and would not like to return to her. She notes she had questions with antiestrogen therapy. She notes her menopause in 2009 had significant side effects of hot flashes. This last 1.5 years. She plans to heave DEXA scan on  07/20/17.   On review of symptoms, pt notes she has been purposely losing weight and has loss 70 pounds. She overall has been eating less since chemotherapy. She notes she still has diarrhea which has improved but daily.      MEDICAL HISTORY:  Past Medical History:  Diagnosis Date  . Arthritis   . Asthma    triggered with EsteMindi Curlingfumes and cigarette smoke  . Cancer (HCC)[place]. Colitis   . Diabetes mellitus without complication (HCC)[place]. History of  radiation therapy 11/22/16-01/10/17   left breast 50.4 Gy in 28 fractions, axillary region 45 Gy in 25 fractions, lumpectomy cavity boost 10 Gy tin 5 fractions  . Hypertension   . Neuropathy     SURGICAL HISTORY: Past Surgical History:  Procedure Laterality Date  . ABDOMINAL HYSTERECTOMY    . BREAST LUMPECTOMY WITH RADIOACTIVE SEED AND SENTINEL LYMPH NODE BIOPSY Left 10/09/2016   Procedure: LEFT BREAST LUMPECTOMY WITH RADIOACTIVE SEED AND L4EFT AXILLARY SENTINEL LYMPH NODE BIOPSY;  Surgeon: [name], MD;  Location: [facility];  Service: General;  Laterality: Left;  . DILATION AND CURETTAGE OF UTERUS    . KNEE ARTHROSCOPY Left   . PORTACATH PLACEMENT Right 05/08/2016   Procedure: INSERTION PORT-A-CATH WITH [place];  Surgeon: [name], MD;  Location: [facility];  Service: General;  Laterality: Right;  . TONSILLECTOMY    . TOTAL HIP ARTHROPLASTY Right   . TOTAL HIP ARTHROPLASTY Left 03/06/2016   Procedure: LEFT TOTAL HIP ARTHROPLASTY ANTERIOR APPROACH;  Surgeon: [name], MD;  Location: [facility];  Service: Orthopedics;  Laterality: Left;    SOCIAL HISTORY: Social History   Socioeconomic History  . Marital status: Married    Spouse name: Not on file  . Number of children: 0  . Years of education: Not on file  . Highest education level: Not on file  Occupational History    Employer: [employer]  . Financial resource strain: Not on file  . Food insecurity:    Worry: Not on file     Inability: Not on file  . Transportation needs:    Medical: Not on file    Non-medical: Not on file  Tobacco Use  . Smoking status: Former Smoker    Packs/day: 1.00    Years: 29.00    Pack years: 29.00    Types: Cigarettes    Last attempt to quit: 10/27/2002    Years since quitting: 14.7  . Smokeless tobacco: Never Used  Substance and Sexual Activity  . Alcohol use: No    Alcohol/week: 0.0 oz  . Drug use: No  . Sexual activity: Yes    Birth control/protection: Surgical  Lifestyle  . Physical activity:    Days per week: Not on file    Minutes per session: Not on file  . Stress: Not on file  Relationships  . Social connections:    Talks on phone: Not on file    Gets together: Not on file    Attends religious service: Not on file    Active member of club or organization: Not on file    Attends meetings of clubs or organizations: Not on file    Relationship status: Not on file  . Intimate partner violence:    Fear of current or ex partner: Not on file    Emotionally abused: Not on file    Physically abused: Not on file    Forced sexual activity: Not on file  Other Topics Concern  . Not on file  Social History Narrative   Married   Former smoker   No ETOH, drug use    FAMILY HISTORY: Family History  Problem Relation Age of Onset  . Colon cancer Father   . Stomach cancer Paternal Uncle   . Stomach cancer Paternal Uncle   . Melanoma Brother   . Thyroid cancer Brother   . Breast cancer Maternal Aunt   . Breast cancer Maternal Aunt   . Breast cancer Cousin  ALLERGIES:  is allergic to lisinopril; augmentin [amoxicillin-pot clavulanate]; benadryl [diphenhydramine]; and losartan potassium.  MEDICATIONS:  Current Outpatient Medications  Medication Sig Dispense Refill  . albuterol (PROVENTIL HFA;VENTOLIN HFA) 108 (90 Base) MCG/ACT inhaler Inhale 1-2 puffs into the lungs every 6 (six) hours as needed for wheezing or shortness of breath.    . dicyclomine (BENTYL) 20  MG tablet Take 1 tablet (20 mg total) by mouth 4 (four) times daily -  before meals and at bedtime. 30 tablet 0  . diphenoxylate-atropine (LOMOTIL) 2.5-0.025 MG tablet TAKE 1 TO 2 TABLETS BY MOUTH EVERY 6 HOURS AS NEEDED FOR DIARRHEA 120 tablet 0  . gabapentin (NEURONTIN) 100 MG capsule Take 200 mg by mouth at bedtime.     . Mesalamine 800 MG TBEC Take 2 tablets (1,600 mg total) by mouth 3 (three) times daily. 180 tablet 0  . metFORMIN (GLUCOPHAGE-XR) 500 MG 24 hr tablet Take 1,000 mg by mouth at bedtime.    . metoprolol (LOPRESSOR) 50 MG tablet Take 50 mg by mouth 2 (two) times daily. Takes 1 in the am and 1 at HS    . niacin 250 MG tablet Take 250 mg by mouth at bedtime.     . potassium chloride SA (KLOR-CON M20) 20 MEQ tablet Take 1 tablet (20 mEq total) by mouth 2 (two) times daily. 60 tablet 2  . saccharomyces boulardii (FLORASTOR) 250 MG capsule Take 250 mg by mouth 2 (two) times daily.    [name] Kitchen triamterene-hydrochlorothiazide (MAXZIDE) 75-50 MG per tablet Take 0.5 tablets by mouth daily.     [name] Kitchen anastrozole (ARIMIDEX) 1 MG tablet Take 1 tablet (1 mg total) by mouth daily. 30 tablet 2   No current facility-administered medications for this visit.    Facility-Administered Medications Ordered in Other Visits  Medication Dose Route Frequency Provider Last Rate Last Dose  . heparin lock flush 100 unit/mL  500 Units Intracatheter Once PRN Truitt Merle, MD      . sodium chloride flush (NS) 0.9 % injection 10 mL  10 mL Intracatheter PRN Truitt Merle, MD      . sodium chloride flush (NS) 0.9 % injection 10 mL  10 mL Intracatheter PRN Truitt Merle, MD   10 mL at 08/24/16 1725  . sodium chloride flush (NS) 0.9 % injection 10 mL  10 mL Intracatheter PRN Truitt Merle, MD   10 mL at 11/16/16 1628   REVIEW OF SYSTEMS:   Constitutional: Denies fevers, chills or abnormal night sweats  (+) purposeful weight loss Eyes: Denies blurriness of vision, double vision or watery eyes Ears, nose, mouth, throat, and face: Denies  mucositis or sore throat Respiratory: Denies cough, dyspnea or wheezes Cardiovascular: Denies palpitation.  Gastrointestinal:  Denies heartburn. Denies nausea, vomiting. (+) diarrhea, controlled Skin: Denies abnormal skin rashes Lymphatics: Denies new lymphadenopathy or easy bruising Neurological: (+) neuropathy in hands/feet, unchanged Behavioral/Psych: Mood is stable, no new changes  All other systems were reviewed with the patient and are negative.   PHYSICAL EXAMINATION:  ECOG PERFORMANCE STATUS: 1 - Symptomatic but completely ambulatory  Vitals:   07/12/17 1349  BP: 134/87  Pulse: 75  Resp: 17  Temp: 98.7 F (37.1 C)  TempSrc: Oral  SpO2: 100%  Weight: 197 lb 6.4 oz (89.5 kg)  Height: 5' 4"  (1.626 m)   GENERAL:alert, no distress and comfortable SKIN: skin color, texture, turgor are normal, no rashes or significant lesions EYES: normal, conjunctiva are pink and non-injected, sclera clear OROPHARYNX:no exudate, no erythema and lips,  buccal mucosa, and tongue normal  NECK: supple, thyroid normal size, non-tender, without nodularity LYMPH:  no palpable lymphadenopathy in the cervical, axillary or inguinal LUNGS: clear to auscultation and percussion with normal breathing effort HEART: regular rate & rhythm and no murmurs and no lower extremity edema ABDOMEN:abdomen soft, non-tender and normal bowel sounds Musculoskeletal:no cyanosis of digits and no clubbing  PSYCH: alert & oriented x 3 with fluent speech NEURO: no focal motor/sensory deficits BREAST: deferred today   LABORATORY DATA:  I have reviewed the data as listed CBC Latest Ref Rng & Units 06/28/2017 06/03/2017 06/01/2017  WBC 3.9 - 10.3 K/uL 8.1 7.3 12.7(H)  Hemoglobin 11.6 - 15.9 g/dL 12.4 10.2(L) 10.4(L)  Hematocrit 34.8 - 46.6 % 38.3 32.4(L) 32.1(L)  Platelets 145 - 400 K/uL 244 234 208   CMP Latest Ref Rng & Units 06/28/2017 06/03/2017 06/01/2017  Glucose 70 - 140 mg/dL 97 89 177(H)  BUN 7 - 26 mg/dL 9 13 13    Creatinine 0.60 - 1.10 mg/dL 0.91 0.88 0.77  Sodium 136 - 145 mmol/L 137 141 138  Potassium 3.5 - 5.1 mmol/L 3.3(L) 4.4 3.8  Chloride 98 - 109 mmol/L 96(L) 109 106  CO2 22 - 29 mmol/L 29 25 23   Calcium 8.4 - 10.4 mg/dL 9.5 8.4(L) 8.7(L)  Total Protein 6.4 - 8.3 g/dL 7.7 - 6.4(L)  Total Bilirubin 0.2 - 1.2 mg/dL 0.6 - 0.6  Alkaline Phos 40 - 150 U/L 89 - 68  AST 5 - 34 U/L 15 - 14(L)  ALT 0 - 55 U/L 14 - 13(L)   PATHOLOGY REPORT:    Diagnosis 10/09/16 1. Breast, lumpectomy, Left - INVASIVE DUCTAL CARCINOMA, GRADE 3, SPANNING 2.4 CM. - HIGH GRADE DUCTAL CARCINOMA IN SITU WITH NECROSIS. - RESECTION MARGINS ARE NEGATIVE FOR CARCINOMA. - BIOPSY SITE. - SEE ONCOLOGY TABLE. 2. Breast, excision, Left additional medial margin - FIBROCYSTIC CHANGE. - NO MALIGNANCY IDENTIFIED. 3. Lymph node, sentinel, biopsy, Left Axillary - METASTATIC CARCINOMA IN ONE OF ONE LYMPH NODES (1/1). Microscopic Comment 1. BREAST, STATUS POST NEOADJUVANT TREATMENT Procedure: Left lumpectomy with additional medial margin excision, left axillary sentinel lymph node biopsy. Laterality: Left. Tumor Size: 2.4 cm. Histologic Type: Invasive ductal carcinoma. Grade: 3 Tubular Differentiation: 3 Nuclear Pleomorphism: 3 Mitotic Count: 3 Ductal Carcinoma in Situ (DCIS): Present, high grade with necrosis. Regional Lymph Nodes: Number of Lymph Nodes Examined: 1 Number of Sentinel Lymph Nodes Examined: 1 Lymph Nodes with Macrometastases: 1 Lymph Nodes with Micrometastases: 0 Lymph Nodes with Isolated Tumor Cells: 0 Margins: Invasive carcinoma, distance from closest margin: >0.5 cm all original margins. DCIS, distance from closest margin: >0.5 cm all original margins. Extent of Tumor: Confined to breast parenchyma. Breast Prognostic Profile (pre-neoadjuvant case #: [id number], see below). Estrogen Receptor: Negative. Progesterone Receptor: Negative. Her2: Positive(ratio 2.25). Ki-67: 30% 2 of 4 FINAL for  Figueira, Tazia P ([id number]) Microscopic Comment(continued) Will be repeated on the current case (Block #: 1A) and the results reported separately. Residual Cancer Burden (RCB): Primary Tumor Bed: 24 mm x 20 mm Overall Cancer Cellularity: 75% Percentage of Cancer that is in Situ: 5% Number of Positive Lymph Nodes: 1 Diameter of Largest Lymph Node metastasis: 3 mm Residual Cancer Burden : 3.439 Residual Cancer Burden Class: RCB-III Pathologic Stage Classification (p TNM, AJCC 8th Edition): Primary Tumor (ypT): ypT2 Regional Lymph Nodes (ypN): ypN1a 1. PROGNOSTIC INDICATORS Results: IMMUNOHISTOCHEMICAL AND MORPHOMETRIC ANALYSIS PERFORMED MANUALLY Estrogen Receptor: 30%, POSITIVE, WEAK STAINING INTENSITY Progesterone Receptor: 0%, NEGATIVE COMMENT: The negative hormone receptor study(ies)  in this case has an internal positive control. REFERENCE RANGE ESTROGEN RECEPTOR NEGATIVE 0% POSITIVE =>1% REFERENCE RANGE PROGESTERONE RECEPTOR NEGATIVE 0% POSITIVE =>1% All controls stained appropriately Enid Cutter MD Pathologist, Electronic Signature ( Signed 10/16/2016) 1. FLUORESCENCE IN-SITU HYBRIDIZATION Results: HER2 - **POSITIVE** RATIO OF HER2/CEP17 SIGNALS 2.67 AVERAGE HER2 COPY NUMBER PER CELL 4.00 Reference Range: NEGATIVE HER2/CEP17 Ratio <2.0 and average HER2 copy number <4.0 L DIAG NOSIS Diagnosis 04/25/16 Breast, left, needle core biopsy - INVASIVE DUCTAL CARCINOMA, SEE COMMENT. - DUCTAL CARCINOMA IN SITU. - LYMPHOVASCULAR INVASION PRESENT. Microscopic Comment The carcinoma appears grade 3 with focal squamous differentiation. Prognostic markers will be ordered. Dr. Lyndon Code has reviewed the case. The case was called to Dr. Isaiah Blakes on 04/26/2016. Vicente Males MD Pathologist, Electronic Signature (Case signed 04/26/2016) ADDITIONAL INFORMATION: 04/25/16 PROGNOSTIC INDICATORS Results: IMMUNOHISTOCHEMICAL AND MORPHOMETRIC ANALYSIS PERFORMED MANUALLY Estrogen Receptor: 0%,  NEGATIVE Progesterone Receptor: 0%, NEGATIVE Proliferation Marker Ki67: 30% COMMENT: The negative hormone receptor study(ies) in this case has no internal positive control. REFERENCE RANGE ESTROGEN RECEPTOR NEGATIVE 0% POSITIVE =>1% REFERENCE RANGE PROGESTERONE RECEPTOR NEGATIVE 0% POSITIVE =>1% All controls stained appropriately Enid Cutter MD Pathologist, Electronic Signature ( Signed 05/01/2016) FLUORESCENCE IN-SITU HYBRIDIZATION Results: HER2 - **POSITIVE** RATIO OF HER2/CEP17 SIGNALS 2.25 AVERAGE HER2 COPY NUMBER PER CELL 8.45 1 of 3 FINAL for Mcintyre, Elzada P ([id number]) ADDITIONAL INFORMATION:(continued) Reference Range: NEGATIVE HER2/CEP17 Ratio <2.0 and average HER2 copy number <4.0 EQUIVOCAL HER2/CEP17 Ratio <2.0 and average HER2 copy number 4.0 and <6.0 POSITIVE HER2/CEP17 Ratio >=2.0 or <2.0 and average HER2 copy number >=6.0 Enid Cutter MD Pathologist, Electronic Signature ( Signed 04/30/2016)   PROCEDURES  ECHO 03/12/17 Impressions: - Normal LV size with EF 55-60%. Strain as above. Normal RV size   and systolic function. No significant valvular abnormalities.  ECHO 11/21/16  Impressions: - Normal LV size and systolic function, EF 85-63%. Normal diastolic   function. Global longitudinal strain less negative than prior but   images not as clean. Normal RV size and systolic function.  Echo 05/09/16 Study Conclusions -LF EF: 55-60% - Left ventricle: The cavity size was normal. There was mild focal   basal hypertrophy of the septum. Indeterminant diastolic   function. Systolic function was normal. The estimated ejection   fraction was in the range of 55% to 60%. Wall motion was normal;   there were no regional wall motion abnormalities. GLS abnormal at   -13.3%, poor images however.   RADIOGRAPHIC STUDIES: I have personally reviewed the radiological images as listed and agreed with the findings in the report.   MRI Breast Bilateral  09/17/16 IMPRESSION: Smaller left breast mass, now measuring 2.5 cm. No new or suspicious enhancement in either breast. RECOMMENDATION: Treatment plan.  MRI Abdomen W WO Contrast 08/16/16 IMPRESSION: Tiny sub-cm hepatic cysts. No evidence of metastatic disease or other acute findings. Tiny benign left adrenal adenoma and right adrenal myelolipoma.  Mammogram 08/09/16 IMPRESSION:  The 3.1 cm x 2 cm x 1.5 cm irregular equal density mass in the left breast is consistent with the known carcinoma showing mammographic evidence of preoperative chemotherapy response.   [place] of left breast 08/09/16 IMPRESSION:  The 2.7 cm lobulated mass in the left breast is a known biopsy positive for malignancy. Surgical and oncology consult in progress.   See onc history CT ABD/Pelvi 5/10/18s W CONTRAST 06/07/16 IMPRESSION: 1. Wall thickening of the descending and sigmoid colon consistent with an infectious or inflammatory colitis. 2. There is also a short segment of narrowing and possible  wall thickening of the superior ascending colon which could reflect a constricting mass or, more likely, be an area of persistent colonic spasm. This could be further assessed with either colonoscopy or a barium enema after the current symptoms of colitis have resolved. 3. Small, subcentimeter, low-density liver lesions which may all be benign. However, 3 these are not evident on prior CT. Liver metastatic disease possible. These could be further assessed with liver MRI with and without contrast. 4. Adrenal lesions which are stable, on the right and myelolipoma and on the left most likely an adenoma.  CT Angio Chest PE W and/or wo Contrast 05/13/16 IMPRESSION: No evidence of pulmonary emboli.  ASSESSMENT & PLAN: 57 y.o. post-menopausal Caucasian female with a self palpated left breast mass.  1. Breast cancer of upper-outer quadrant of left breast, invasive ductal carcinoma,  stage IIA (cT2N0M0) grade 3, ER-, PR-, HER2  amplified, ypT2N1a, ER30% weakly+, PR-, HER2+ -We previously discussed that surgical resection is the definitive treatment for breast cancer, she was seen by breast surgeon Dr. Lucia Gaskins, lumpectomy versus mastectomy were discussed with patient. -We previously discussed HER2 positive breast cancers total approximately 15% of breast cancers and happens to be more aggressive than HER2 negative cancers, especially ER and PR negative disease, she has high likelihood of cancer recurrence after complete surgical resection.  -I underwent neoadjuvant chemotherapy TCHP (docetaxel, carboplatin, Herceptin and perjeta) every 3 weeks for 6 cycles (04/2016-08/2016) followed by maintenance Herceptin and perjeta to complete 1 year therapy, since 08/2016 and completed in 05/10/17. -Her baseline echo was normal, she was seen by cardiologist Dr. Benjamine Mola -She unfortunately developed severe diarrhea and cytopenia after chemotherapy, and was hospitalized for several times. Chemotherapy was subsequently postponed and dose reduced, and stopped after 4 months treatment. -She underwent left breast lumpectomy on 10/09/16 and the pathology results show significant residual disease, RCB-III -Due to her residual cancer and positive lymph node I also recommend adjuvant chemoradiation with Xeloda for 6 months. She completed chemoradiation 11/22/16-01/10/17. And proceeded to complete Xeloda in 05/15/17.  -Echo 11/21/16 LVEF 62-13%, normal systolic and diastolic function -I also discussed the option of Nerlynx with her previously and we decided not to pursue due to the small benefit and potential severe diarrhea  -She attended Survivorship clinic on 06/28/17 -I reviewed her pathology results again today, her initial biopsy sample was ER/PR negative and her surgical sample was found to be 30% ER weakly positive so she will benefit from antiestrogen therapy to further reduce risk of recurrence. Her options are Tamoxifen, Anastrozole, Letrozole or  Exemestane.  She is postmenopausal.  ---The potential benefit and side effects, which includes but not limited to, hot flash, skin and vaginal dryness, metabolic changes ( increased blood glucose, cholesterol, weight, etc.), slightly in increased risk of cardiovascular disease, cataracts, muscular and joint discomfort, osteopenia and osteoporosis, etc, were discussed with her in great details. She is interested, and we'll start next week.  -I recommend she maintain healthy diet with several small portions. She should include vegetables and fresh foods -I encouraged her to continue exercise, and try to lose more weight.  She has lost some lately. -F/u in 3 months   2. Genetics -I recommended her to seek genetic counseling given her strong family history of breast and colon cancer which is very suspicious of Lynch Syndrome or other inheritable cancer syndromes. I explained based on her genetic results this may change how often she gets cancer screenings. She has no children. -She understands and still declined genetic  testing. She knows to continue cancer screenings. -I recommend she share her family history of colon cancer with Dr. Fuller Plan as she may need more frequent screenings.   3. Type 2 Diabetes mellitus, HTN -Managed by her PCP. -The patient has peripheral neuropathy in her feet from her diabetes. The patient is already on Neurontin with 200 mg at night. We previously discussed that chemotherapy may make her neuropathy worse. -Steroids will be given to reduce chemo side effects and I will reduce dexa to 25m daily to not affect her blood sugar much. -DM and HTN, controlled.   4. Ulcerative colitis and chronic diarrhea  -We previously discussed that chemotherapy would cause diarrhea and the patient's colitis may exacerbate during chemo  -She is not taking steroids for this. She is on mesalamine, will follow up with Dr. SFuller Plan -I previously advised the patient to keep herself adequately  hydrated and to take Imodium PRN. -She was hospitalized on 05/31/17 for a lower GI bleed and was given vancomycin to treat as this was suspected to be related to her UC. She is C.Diff antigen carrier positive. There was no suspicion for cancer on her CT scan.   -Continue to follow up with GI, I recommend a repeat coloscopy within the next year.  -Her diarrhea is controlled with imodium and lomotil, manageable.   5. Arthritis  -s/p b/l hip replacement, last surgery in February 2018.  -I previously encouraged her to f/u with PCP for arthritic pain management -Will monitor while on Anastrozole.    6. Peripheral Neuropathy, secondary to chemotherapy  -neuropathy is worse following bouts of typing. I previously encouraged her to use her left hand more often and begin wearing cotton gloves.   -She is currently on Gabapentin for her baseline neuropathy associated w/ her DM, she currently takes this TID.  -She takes 200 mg Neurontin at night, may increase to 300-400 mg at bedtime as needed. Continue Vitamin B12. -Neuropathy stable.    7. Hypokalemia  -secondary to chemotherapy  -She now takes potassium BID. I advised her to increase K if she experiences diarrhea. I also suggest she increase potassium in her diet.  -Her latest potassium from 06/28/17 was 3.3. Continue oral potassium.   8. Bone Health  -I discussed anastrozole may weaken her bone so I recommend monitoring this with DEXA scans every other years  -Her next DEXA is 07/20/17 through her GYN.    PLAN -I prescribed her Anastrozole today to start as soon as she gets it.  -Lab and f/u in 3 months    No orders of the defined types were placed in this encounter.  All questions were answered. The patient knows to call the clinic with any problems, questions or concerns.  I spent 20 minutes counseling the patient face to face. The total time spent in the appointment was 25 minutes and more than 50% was on counseling.  IOneal Deputy  am acting as scribe for YTruitt Merle MD.   I have reviewed the above documentation for accuracy and completeness, and I agree with the above.      YTruitt Merle MD 07/12/2017 3:21 PM

## 2017-07-12 ENCOUNTER — Inpatient Hospital Stay: Payer: 59 | Attending: Hematology | Admitting: Hematology

## 2017-07-12 ENCOUNTER — Encounter: Payer: Self-pay | Admitting: Hematology

## 2017-07-12 VITALS — BP 134/87 | HR 75 | Temp 98.7°F | Resp 17 | Ht 64.0 in | Wt 197.4 lb

## 2017-07-12 DIAGNOSIS — K5130 Ulcerative (chronic) rectosigmoiditis without complications: Secondary | ICD-10-CM | POA: Diagnosis not present

## 2017-07-12 DIAGNOSIS — C773 Secondary and unspecified malignant neoplasm of axilla and upper limb lymph nodes: Secondary | ICD-10-CM | POA: Diagnosis not present

## 2017-07-12 DIAGNOSIS — C50412 Malignant neoplasm of upper-outer quadrant of left female breast: Secondary | ICD-10-CM | POA: Insufficient documentation

## 2017-07-12 DIAGNOSIS — I10 Essential (primary) hypertension: Secondary | ICD-10-CM | POA: Diagnosis not present

## 2017-07-12 DIAGNOSIS — M810 Age-related osteoporosis without current pathological fracture: Secondary | ICD-10-CM | POA: Insufficient documentation

## 2017-07-12 DIAGNOSIS — E1140 Type 2 diabetes mellitus with diabetic neuropathy, unspecified: Secondary | ICD-10-CM | POA: Diagnosis not present

## 2017-07-12 DIAGNOSIS — E119 Type 2 diabetes mellitus without complications: Secondary | ICD-10-CM

## 2017-07-12 DIAGNOSIS — Z171 Estrogen receptor negative status [ER-]: Secondary | ICD-10-CM | POA: Diagnosis not present

## 2017-07-12 DIAGNOSIS — G620 Drug-induced polyneuropathy: Secondary | ICD-10-CM | POA: Insufficient documentation

## 2017-07-12 DIAGNOSIS — E876 Hypokalemia: Secondary | ICD-10-CM | POA: Insufficient documentation

## 2017-07-12 DIAGNOSIS — K5190 Ulcerative colitis, unspecified, without complications: Secondary | ICD-10-CM | POA: Insufficient documentation

## 2017-07-12 MED ORDER — ANASTROZOLE 1 MG PO TABS: 1.0000 mg | ORAL_TABLET | Freq: Every day | ORAL | 2 refills | Status: DC

## 2017-07-14 NOTE — Progress Notes (Signed)
Reviewed and agree with initial management plan.  Malcolm T. Stark, MD FACG 

## 2017-07-16 ENCOUNTER — Other Ambulatory Visit: Payer: Self-pay

## 2017-07-16 DIAGNOSIS — Z171 Estrogen receptor negative status [ER-]: Principal | ICD-10-CM

## 2017-07-16 DIAGNOSIS — C50412 Malignant neoplasm of upper-outer quadrant of left female breast: Secondary | ICD-10-CM

## 2017-07-17 ENCOUNTER — Other Ambulatory Visit: Payer: Self-pay

## 2017-07-17 DIAGNOSIS — Z171 Estrogen receptor negative status [ER-]: Principal | ICD-10-CM

## 2017-07-17 DIAGNOSIS — C50412 Malignant neoplasm of upper-outer quadrant of left female breast: Secondary | ICD-10-CM

## 2017-07-17 MED ORDER — ANASTROZOLE 1 MG PO TABS: 1.0000 mg | ORAL_TABLET | Freq: Every day | ORAL | 2 refills | Status: DC

## 2017-07-17 NOTE — Telephone Encounter (Signed)
Spoke with patient explained [facility] does not have the Anastrozole it is on back order.  I will send into [facility].  Patient verbalized an understanding.

## 2017-07-20 ENCOUNTER — Encounter: Payer: Self-pay | Admitting: Hematology

## 2017-07-20 DIAGNOSIS — Z780 Asymptomatic menopausal state: Secondary | ICD-10-CM | POA: Diagnosis not present

## 2017-07-22 ENCOUNTER — Telehealth: Payer: Self-pay

## 2017-07-22 NOTE — Telephone Encounter (Signed)
Spoke with patient per [name] notified her bone density scan was normal.  Patient verbalized an understanding.

## 2017-07-25 DIAGNOSIS — K5190 Ulcerative colitis, unspecified, without complications: Secondary | ICD-10-CM | POA: Diagnosis not present

## 2017-07-25 DIAGNOSIS — C50912 Malignant neoplasm of unspecified site of left female breast: Secondary | ICD-10-CM | POA: Diagnosis not present

## 2017-07-26 ENCOUNTER — Other Ambulatory Visit: Payer: Self-pay | Admitting: Gastroenterology

## 2017-08-08 MED FILL — ANASTROZOLE 1 MG TABLET: 1 | 30 days supply | Qty: 30 | Fill #0

## 2017-08-22 DIAGNOSIS — Z452 Encounter for adjustment and management of vascular access device: Secondary | ICD-10-CM | POA: Diagnosis not present

## 2017-08-22 DIAGNOSIS — Z853 Personal history of malignant neoplasm of breast: Secondary | ICD-10-CM | POA: Diagnosis not present

## 2017-09-25 ENCOUNTER — Telehealth: Payer: Self-pay | Admitting: Hematology

## 2017-09-25 NOTE — Telephone Encounter (Signed)
Patient called to cancel flush , she said she does not have a port

## 2017-09-27 ENCOUNTER — Telehealth: Payer: Self-pay | Admitting: Hematology

## 2017-09-27 ENCOUNTER — Other Ambulatory Visit: Payer: 59

## 2017-09-27 ENCOUNTER — Ambulatory Visit: Payer: 59 | Admitting: Hematology

## 2017-09-27 NOTE — Telephone Encounter (Signed)
Pt aware  Of appt per 8/30 sch message -

## 2017-10-07 NOTE — Progress Notes (Signed)
[facility]  [phone] Fax:([phone]) [phone]  Clinic Follow Up Note   Patient Care Team: Brandi Melter, MD as PCP - General (Family Medicine) Brandi Overall, MD as Consulting Physician (General Surgery) Brandi Merle, MD as Consulting Physician (Hematology) Brandi Pray, MD as Consulting Physician (Radiation Oncology) Brandi Artist, MD as Consulting Physician (Gastroenterology) Brandi Bison Charlestine Massed, NP as Nurse Practitioner (Hematology and Oncology)   Date of Service:  10/08/2017   CHIEF COMPLAINTS:  Follow up left breast cancer  Oncology History   Cancer Staging Breast cancer of upper-outer quadrant of left female breast [facility]) Staging form: Breast, AJCC 8th Edition - Clinical stage from 04/25/2016: Stage IIA (cT2, cN0, cM0, G3, ER: Negative, PR: Negative, HER2: Positive) - Signed by Brandi Merle, MD on 05/02/2016 - Pathologic stage from 10/09/2016: No Stage Recommended (ypT2, pN1a, cM0, G3, ER+, PR-, HER2+) - Signed by Brandi Merle, MD on 07/11/2017       Breast cancer of upper-outer quadrant of left female breast ([place])   04/24/2016 Mammogram    Category B breasts with a new irregular mass in the UOQ left breast middle depth. Ultrasound revealed a 3.9 cm mass in the 1:00 position. The left axilla was negative.     04/25/2016 Initial Biopsy    Biopsy of the left breast showed grade 3 invasive ductal carcinoma, DCIS, and lymphovascular invasion was present.    04/25/2016 Receptors her2    ER 0% negative, PR 0% negative, HER2 positive, Ki67 30%    05/02/2016 Initial Diagnosis    Breast cancer of upper-outer quadrant of left female breast ([place])    05/07/2016 Imaging    MRI of the bilateral breast 05/07/16 IMPRESSION: Lobulated enhancing mass (4.0 x 2.7 x 3.1 cm) in the upper-outer quadrant of the left breast corresponding with the recently diagnosed invasive mammary carcinoma. Linear enhancement extends 2.6 cm posterior to the mass worrisome for ductal  carcinoma in-situ.    05/09/2016 Echocardiogram    Echo 05/09/16 -LF EF: 55-60%    05/11/2016 - 08/31/2016 Neo-Adjuvant Chemotherapy    Cycle 1 Docetaxel, Carboplatin, Herceptin and pejeta (TCHP), cycle 2 Docetaxel changed to Taxol, carbo dose reduced to AUC 4.5 (from 5) due to severe diarrhea and cytopenia, Perjeta held after cycle 2.   Due to poor toleration, Brandi Dickson was held after cycle 4 and Taxol changed to weekly X4. Herceptin was continued during the above chemo.      05/13/2016 Imaging    CT Angio Chest PE IMPRESSION: No evidence of pulmonary emboli. Left breast mass consistent with the given clinical history. Stable left adrenal lesion likely representing a small adenoma.    05/18/2016 - 05/24/2016 Hospital Admission    Patient presented with nausea, vomiting, and diarrhea; admitted to hospital with Hyponatremia    06/07/2016 - 06/09/2016 Hospital Admission    Patient presents to hospital complaints of rectal bleeding and abdominal cramps when defacating    06/07/2016 Imaging    CT ABDOMEN PELVIS W CONTRAST  IMPRESSION: 1. Wall thickening of the descending and sigmoid colon consistent with an infectious or inflammatory colitis. 2. There is also a short segment of narrowing and possible wall thickening of the superior ascending colon which could reflect a constricting mass or, more likely, be an area of persistent colonic spasm. This could be further assessed with either colonoscopy or a barium enema after the current symptoms of colitis have resolved. 3. Small, subcentimeter, low-density liver lesions which may all be benign. However, 3 these are not evident on  prior CT. Liver metastatic disease possible. These could be further assessed with liver MRI with and without contrast. 4. Adrenal lesions which are stable, on the right and myelolipoma and on the left most likely an adenoma.    06/07/2016 Imaging    CT A/P IMPRESSION: 1. Wall thickening of the descending and sigmoid  colon consistent with an infectious or inflammatory colitis. 2. There is also a short segment of narrowing and possible wall thickening of the superior ascending colon which could reflect a constricting mass or, more likely, be an area of persistent colonic spasm. This could be further assessed with either colonoscopy or a barium enema after the current symptoms of colitis have resolved. 3. Small, subcentimeter, low-density liver lesions which may all be benign. However, 3 these are not evident on prior CT. Liver metastatic disease possible. These could be further assessed with liver MRI with and without contrast. 4. Adrenal lesions which are stable, on the right and myelolipoma and on the left most likely an adenoma.    06/07/2016 - 06/09/2016 Hospital Admission    Diarrhea and rectal Bleeding    07/19/2016 - 07/20/2016 Hospital Admission    Diarrhea and dehydration    08/09/2016 Mammogram    Mammogram 08/09/16 IMPRESSION:  The 3.1 cm x 2 cm x 1.5 cm irregular equal density mass in the left breast is consistent with the known carcinoma showing mammographic evidence of preoperative chemotherapy response.       08/09/2016 Imaging    [place] of left breast 08/09/16 IMPRESSION:  The 2.7 cm lobulated mass in the left breast is a known biopsy positive for malignancy. Surgical and oncology consult in progress.    08/16/2016 Imaging    MRI Abdomen W WO Contrast IMPRESSION: Tiny sub-cm hepatic cysts. No evidence of metastatic disease or other acute findings. Tiny benign left adrenal adenoma and right adrenal myelolipoma.    09/14/2016 - 05/10/2017 Chemotherapy    Maintenance Herceptin every 3 weeks. Added Perjeta back on 10/26/16. Completed on 05/10/17    09/17/2016 Imaging    MRI Breast Bilateral 09/17/16 IMPRESSION: Smaller left breast mass, now measuring 2.5 cm. No new or suspicious enhancement in either breast.  RECOMMENDATION: Treatment plan.    10/09/2016 Surgery    LEFT BREAST  LUMPECTOMY WITH RADIOACTIVE SEED AND L4EFT AXILLARY SENTINEL LYMPH NODE BIOPSY by Dr. Lucia Dickson on 10/09/16    10/09/2016 Pathology Results    Diagnosis 10/09/16 1. Breast, lumpectomy, Left - INVASIVE DUCTAL CARCINOMA, GRADE 3, SPANNING 2.4 CM. - HIGH GRADE DUCTAL CARCINOMA IN SITU WITH NECROSIS. - RESECTION MARGINS ARE NEGATIVE FOR CARCINOMA. - BIOPSY SITE. - SEE ONCOLOGY TABLE. 2. Breast, excision, Left additional medial margin - FIBROCYSTIC CHANGE. - NO MALIGNANCY IDENTIFIED. 3. Lymph node, sentinel, biopsy, Left Axillary - METASTATIC CARCINOMA IN ONE OF ONE LYMPH NODES (1/1).      11/22/2016 - 01/10/2017 Radiation Therapy    Radiation with Dr. Sondra Come and complete on 01/10/17    11/22/2016 - 05/15/2017 Chemotherapy    Xeloda With radiation she will be on low dose 4 tablets in the am and 3 tablets in the pm. Starting 11/22/16 and complete on 01/10/17.   Will continue Xeloda, 14 days on and 7 days off, 4 tablets BID starting 01/18/17 and completed on 05/15/17.  -Due to severe diarrhea, her Xeloda dose was decreased from 2000 mg twice daily to 2040m in am and 15033min pm on 02/11/17.      03/12/2017 Echocardiogram    ECHO 03/12/17 Impressions: - Normal  LV size with EF 55-60%. Strain as above. Normal RV size   and systolic function. No significant valvular abnormalities.    04/18/2017 Mammogram    IMPRESSION:  New post treatment changes of the left breast. There is no mammographic evidence of malignancy. A follow-up mammogram in 12 months is recommended.     06/10/2017 Echocardiogram    ECHO 06/10/2017 LV EF: 55% -   60%    06/2017 -  Anti-estrogen oral therapy    Anastrozole 75m daily, starting 06/2017    07/20/2017 Imaging    DEXA Scan WNLs    07/20/2017 Imaging    DEXA Scan with T-score of 0.4     HISTORY OF PRESENTING ILLNESS (05/02/16):  RMarcello Moores89y.o. female is here because of a new diagnosis of left breast cancer. She is accompanied by her husband to our  multidisciplinary breast clinic today.  The patient presented with a palpable left breast lump approximately 2 weeks ago. Bilateral diagnostic mammogram on 04/24/16 showed Category B breasts with a new irregular mass in the UOQ left breast middle depth. Ultrasound performed on 04/24/16 revealed a 3.9 cm mass in the 1:00 position. The left axilla was negative.  Biopsy of the left breast on 04/25/16 showed grade 3 invasive ductal carcinoma, DCIS, and lymphovascular invasion was present (ER 0% negative, PR 0% negative, HER2 positive, Ki67 30%).  She denies tenderness of the biopsied area. Reports minor dimpling. The patient is 8 weeks out from a left hip replacement. She had a right hip replacement 2010 years ago.  The patient and her husband present today in multidisciplinary breast clinic to discuss treatment options for the management of her disease.  The patient is taking Neurontin for neuropathy in her feet from diabetes. She has been diagnosed for diabetes for the past 4 years. She states she only has neuropathy at night and take 2 Neurontin at night and then has no symptoms.  GYN HISTORY  Menarchal: 9 LMP: Complete hysterectomy for uterine fibroids ~ 2009. She was bleeding too much with her last menstrual cycle lasting 6 weeks. No issues with menopause. Contraceptive: no  HRT: No GP: G1P0  CURRENT THERAPY:   Anastrozole 145mdaily, starting 06/2017   INTERVAL HISTORY:   ReZAMORIA BOSSeturns for follow up. Her brother recently passed from cancer. She intentionally lost up to 83 lbs following completion of her chemotherapy. She notes that she feels better with the weight loss and she would like to continue until 150 lbs. She has been on anastrozole for about 3 months and while on anastrozole, she has had hot flashes (tolerable) and rash to her chin. She denies joint pain.   Since her last visit to the office, she had a DEXA scan completed on 07/20/2017 with a T-score of 0.4.  On review  of systems, she reports hot flashes and darkness to toe nails. she denies fatigue and any other symptoms. Pertinent positives are listed and detailed within the above HPI.  MEDICAL HISTORY:  Past Medical History:  Diagnosis Date  . Arthritis   . Asthma    triggered with EsMindi Curlingerfumes and cigarette smoke  . Cancer (HCPurcell  . Colitis   . Diabetes mellitus without complication ([place]  . History of radiation therapy 11/22/16-01/10/17   left breast 50.4 Gy in 28 fractions, axillary region 45 Gy in 25 fractions, lumpectomy cavity boost 10 Gy tin 5 fractions  . Hypertension   . Neuropathy     SURGICAL  HISTORY: Past Surgical History:  Procedure Laterality Date  . ABDOMINAL HYSTERECTOMY    . BREAST LUMPECTOMY WITH RADIOACTIVE SEED AND SENTINEL LYMPH NODE BIOPSY Left 10/09/2016   Procedure: LEFT BREAST LUMPECTOMY WITH RADIOACTIVE SEED AND L4EFT AXILLARY SENTINEL LYMPH NODE BIOPSY;  Surgeon: Brandi Overall, MD;  Location: [facility];  Service: General;  Laterality: Left;  . DILATION AND CURETTAGE OF UTERUS    . KNEE ARTHROSCOPY Left   . PORTACATH PLACEMENT Right 05/08/2016   Procedure: INSERTION PORT-A-CATH WITH [place];  Surgeon: Brandi Overall, MD;  Location: [facility];  Service: General;  Laterality: Right;  . TONSILLECTOMY    . TOTAL HIP ARTHROPLASTY Right   . TOTAL HIP ARTHROPLASTY Left 03/06/2016   Procedure: LEFT TOTAL HIP ARTHROPLASTY ANTERIOR APPROACH;  Surgeon: [name], MD;  Location: [facility];  Service: Orthopedics;  Laterality: Left;    SOCIAL HISTORY: Social History   Socioeconomic History  . Marital status: Married    Spouse name: Not on file  . Number of children: 0  . Years of education: Not on file  . Highest education level: Not on file  Occupational History    Employer: [employer]  . Financial resource strain: Not on file  . Food insecurity:    Worry: Not on file    Inability: Not on file  . Transportation needs:     Medical: Not on file    Non-medical: Not on file  Tobacco Use  . Smoking status: Former Smoker    Packs/day: 1.00    Years: 29.00    Pack years: 29.00    Types: Cigarettes    Last attempt to quit: 10/27/2002    Years since quitting: 14.9  . Smokeless tobacco: Never Used  Substance and Sexual Activity  . Alcohol use: No    Alcohol/week: 0.0 standard drinks  . Drug use: No  . Sexual activity: Yes    Birth control/protection: Surgical  Lifestyle  . Physical activity:    Days per week: Not on file    Minutes per session: Not on file  . Stress: Not on file  Relationships  . Social connections:    Talks on phone: Not on file    Gets together: Not on file    Attends religious service: Not on file    Active member of club or organization: Not on file    Attends meetings of clubs or organizations: Not on file    Relationship status: Not on file  . Intimate partner violence:    Fear of current or ex partner: Not on file    Emotionally abused: Not on file    Physically abused: Not on file    Forced sexual activity: Not on file  Other Topics Concern  . Not on file  Social History Narrative   Married   Former smoker   No ETOH, drug use    FAMILY HISTORY: Family History  Problem Relation Age of Onset  . Colon cancer Father   . Stomach cancer Paternal Uncle   . Stomach cancer Paternal Uncle   . Melanoma Brother   . Thyroid cancer Brother   . Breast cancer Maternal Aunt   . Breast cancer Maternal Aunt   . Breast cancer Cousin     ALLERGIES:  is allergic to lisinopril; augmentin [amoxicillin-pot clavulanate]; benadryl [diphenhydramine]; and losartan potassium.  MEDICATIONS:  Current Outpatient Medications  Medication Sig Dispense Refill  . albuterol (PROVENTIL HFA;VENTOLIN HFA) 108 (90 Base) MCG/ACT  inhaler Inhale 1-2 puffs into the lungs every 6 (six) hours as needed for wheezing or shortness of breath.    . anastrozole (ARIMIDEX) 1 MG tablet Take 1 tablet (1 mg total) by  mouth daily. 30 tablet 5  . dicyclomine (BENTYL) 20 MG tablet Take 1 tablet (20 mg total) by mouth 4 (four) times daily -  before meals and at bedtime. 30 tablet 0  . diphenoxylate-atropine (LOMOTIL) 2.5-0.025 MG tablet TAKE 1 TO 2 TABLETS BY MOUTH EVERY 6 HOURS AS NEEDED FOR DIARRHEA 120 tablet 0  . gabapentin (NEURONTIN) 100 MG capsule Take 200 mg by mouth at bedtime.     . Mesalamine 800 MG TBEC 2 TABLETS BY MOUTH THREE TIMES A DAY 180 tablet 2  . metFORMIN (GLUCOPHAGE-XR) 500 MG 24 hr tablet Take 1,000 mg by mouth at bedtime.    . metoprolol (LOPRESSOR) 50 MG tablet Take 50 mg by mouth 2 (two) times daily. Takes 1 in the am and 1 at HS    . niacin 250 MG tablet Take 250 mg by mouth at bedtime.     . potassium chloride SA (KLOR-CON M20) 20 MEQ tablet Take 1 tablet (20 mEq total) by mouth 2 (two) times daily. 60 tablet 2  . saccharomyces boulardii (FLORASTOR) 250 MG capsule Take 250 mg by mouth 2 (two) times daily.    [name] Kitchen triamterene-hydrochlorothiazide (MAXZIDE) 75-50 MG per tablet Take 0.5 tablets by mouth daily.      No current facility-administered medications for this visit.    Facility-Administered Medications Ordered in Other Visits  Medication Dose Route Frequency Provider Last Rate Last Dose  . heparin lock flush 100 unit/mL  500 Units Intracatheter Once PRN Brandi Merle, MD      . sodium chloride flush (NS) 0.9 % injection 10 mL  10 mL Intracatheter PRN Brandi Merle, MD      . sodium chloride flush (NS) 0.9 % injection 10 mL  10 mL Intracatheter PRN Brandi Merle, MD   10 mL at 08/24/16 1725  . sodium chloride flush (NS) 0.9 % injection 10 mL  10 mL Intracatheter PRN Brandi Merle, MD   10 mL at 11/16/16 1628   REVIEW OF SYSTEMS:  Constitutional: Denies fevers, chills or abnormal night sweats  (+) purposeful weight loss Eyes: Denies blurriness of vision, double vision or watery eyes Ears, nose, mouth, throat, and face: Denies mucositis or sore throat Respiratory: Denies cough, dyspnea or  wheezes Cardiovascular: Denies palpitation.  Gastrointestinal:  Denies heartburn. Denies nausea, vomiting. (+) diarrhea, controlled, resolved.  Skin: Denies abnormal skin rashes Lymphatics: Denies new lymphadenopathy or easy bruising Neurological: (+) neuropathy in hands/feet, unchanged Behavioral/Psych: Mood is stable, no new changes  All other systems were reviewed with the patient and are negative.   PHYSICAL EXAMINATION:  ECOG PERFORMANCE STATUS: 1 - Symptomatic but completely ambulatory  Vitals:   10/08/17 0924  BP: 136/79  Pulse: 69  Resp: 18  Temp: 98.2 F (36.8 C)  TempSrc: Oral  SpO2: 100%  Weight: 184 lb (83.5 kg)  Height: _0  (1.626 m)   GENERAL:alert, no distress and comfortable SKIN: skin color, texture, turgor are normal, no rashes or significant lesions EYES: normal, conjunctiva are pink and non-injected, sclera clear OROPHARYNX:no exudate, no erythema and lips, buccal mucosa, and tongue normal  NECK: supple, thyroid normal size, non-tender, without nodularity LYMPH:  no palpable lymphadenopathy in the cervical, axillary or inguinal LUNGS: clear to auscultation and percussion with normal breathing effort HEART: regular rate & rhythm  and no murmurs and no lower extremity edema ABDOMEN:abdomen soft, non-tender and normal bowel sounds Musculoskeletal:no cyanosis of digits and no clubbing  PSYCH: alert & oriented x 3 with fluent speech NEURO: no focal motor/sensory deficits BREAST: lumpectomy of left breast. Surgical scar well healed. No scar tissue. Diiffuse skin hyperpigmentation from radiation. No other palpable mass or adenopathy.    LABORATORY DATA:  I have reviewed the data as listed CBC Latest Ref Rng & Units 10/08/2017 06/28/2017 06/03/2017  WBC 3.9 - 10.3 K/uL 3.6(L) 8.1 7.3  Hemoglobin 11.6 - 15.9 g/dL 12.9 12.4 10.2(L)  Hematocrit 34.8 - 46.6 % 40.0 38.3 32.4(L)  Platelets 145 - 400 K/uL 227 244 234   CMP Latest Ref Rng & Units 10/08/2017 06/28/2017  06/03/2017  Glucose 70 - 99 mg/dL 100(H) 97 89  BUN 6 - 20 mg/dL _0 Creatinine 0.44 - 1.00 mg/dL 0.96 0.91 0.88  Sodium 135 - 145 mmol/L 141 137 141  Potassium 3.5 - 5.1 mmol/L 3.6 3.3(L) 4.4  Chloride 98 - 111 mmol/L 105 96(L) 109  CO2 22 - 32 mmol/L _1 Calcium 8.9 - 10.3 mg/dL 9.5 9.5 8.4(L)  Total Protein 6.5 - 8.1 g/dL 7.3 7.7 -  Total Bilirubin 0.3 - 1.2 mg/dL 0.6 0.6 -  Alkaline Phos 38 - 126 U/L 77 89 -  AST 15 - 41 U/L 12(L) 15 -  ALT 0 - 44 U/L 9 14 -   PATHOLOGY REPORT:    Diagnosis 10/09/16 1. Breast, lumpectomy, Left - INVASIVE DUCTAL CARCINOMA, GRADE 3, SPANNING 2.4 CM. - HIGH GRADE DUCTAL CARCINOMA IN SITU WITH NECROSIS. - RESECTION MARGINS ARE NEGATIVE FOR CARCINOMA. - BIOPSY SITE. - SEE ONCOLOGY TABLE. 2. Breast, excision, Left additional medial margin - FIBROCYSTIC CHANGE. - NO MALIGNANCY IDENTIFIED. 3. Lymph node, sentinel, biopsy, Left Axillary - METASTATIC CARCINOMA IN ONE OF ONE LYMPH NODES (1/1). Microscopic Comment 1. BREAST, STATUS POST NEOADJUVANT TREATMENT Procedure: Left lumpectomy with additional medial margin excision, left axillary sentinel lymph node biopsy. Laterality: Left. Tumor Size: 2.4 cm. Histologic Type: Invasive ductal carcinoma. Grade: 3 Tubular Differentiation: 3 Nuclear Pleomorphism: 3 Mitotic Count: 3 Ductal Carcinoma in Situ (DCIS): Present, high grade with necrosis. Regional Lymph Nodes: Number of Lymph Nodes Examined: 1 Number of Sentinel Lymph Nodes Examined: 1 Lymph Nodes with Macrometastases: 1 Lymph Nodes with Micrometastases: 0 Lymph Nodes with Isolated Tumor Cells: 0 Margins: Invasive carcinoma, distance from closest margin: >0.5 cm all original margins. DCIS, distance from closest margin: >0.5 cm all original margins. Extent of Tumor: Confined to breast parenchyma. Breast Prognostic Profile (pre-neoadjuvant case #: [id number], see below). Estrogen Receptor: Negative. Progesterone Receptor:  Negative. Her2: Positive(ratio 2.25). Ki-67: 30% 2 of 4 FINAL for Isaacson, Gearlene P ([id number]) Microscopic Comment(continued) Will be repeated on the current case (Block #: [id number]) and the results reported separately. Residual Cancer Burden (RCB): Primary Tumor Bed: 24 mm x 20 mm Dickson Cancer Cellularity: 75% Percentage of Cancer that is in Situ: 5% Number of Positive Lymph Nodes: 1 Diameter of Largest Lymph Node metastasis: 3 mm Residual Cancer Burden : 3.439 Residual Cancer Burden Class: RCB-III Pathologic Stage Classification (p TNM, AJCC 8th Edition): Primary Tumor (ypT): ypT2 Regional Lymph Nodes (ypN): ypN1a 1. PROGNOSTIC INDICATORS Results: IMMUNOHISTOCHEMICAL AND MORPHOMETRIC ANALYSIS PERFORMED MANUALLY Estrogen Receptor: 30%, POSITIVE, WEAK STAINING INTENSITY Progesterone Receptor: 0%, NEGATIVE COMMENT: The negative hormone receptor study(ies) in this case has an internal positive control. REFERENCE RANGE ESTROGEN RECEPTOR NEGATIVE 0% POSITIVE =>1% REFERENCE RANGE PROGESTERONE  RECEPTOR NEGATIVE 0% POSITIVE =>1% All controls stained appropriately Enid Cutter MD Pathologist, Electronic Signature ( Signed 10/16/2016) 1. FLUORESCENCE IN-SITU HYBRIDIZATION Results: HER2 - **POSITIVE** RATIO OF HER2/CEP17 SIGNALS 2.67 AVERAGE HER2 COPY NUMBER PER CELL 4.00 Reference Range: NEGATIVE HER2/CEP17 Ratio <2.0 and average HER2 copy number <4.0 L DIAG NOSIS Diagnosis 04/25/16 Breast, left, needle core biopsy - INVASIVE DUCTAL CARCINOMA, SEE COMMENT. - DUCTAL CARCINOMA IN SITU. - LYMPHOVASCULAR INVASION PRESENT. Microscopic Comment The carcinoma appears grade 3 with focal squamous differentiation. Prognostic markers will be ordered. Dr. Lyndon Code has reviewed the case. The case was called to Dr. Isaiah Blakes on 04/26/2016. Vicente Males MD Pathologist, Electronic Signature (Case signed 04/26/2016) ADDITIONAL INFORMATION: 04/25/16 PROGNOSTIC  INDICATORS Results: IMMUNOHISTOCHEMICAL AND MORPHOMETRIC ANALYSIS PERFORMED MANUALLY Estrogen Receptor: 0%, NEGATIVE Progesterone Receptor: 0%, NEGATIVE Proliferation Marker Ki67: 30% COMMENT: The negative hormone receptor study(ies) in this case has no internal positive control. REFERENCE RANGE ESTROGEN RECEPTOR NEGATIVE 0% POSITIVE =>1% REFERENCE RANGE PROGESTERONE RECEPTOR NEGATIVE 0% POSITIVE =>1% All controls stained appropriately Enid Cutter MD Pathologist, Electronic Signature ( Signed 05/01/2016) FLUORESCENCE IN-SITU HYBRIDIZATION Results: HER2 - **POSITIVE** RATIO OF HER2/CEP17 SIGNALS 2.25 AVERAGE HER2 COPY NUMBER PER CELL 8.45 1 of 3 FINAL for Rudder, Aisley P ([id number]) ADDITIONAL INFORMATION:(continued) Reference Range: NEGATIVE HER2/CEP17 Ratio <2.0 and average HER2 copy number <4.0 EQUIVOCAL HER2/CEP17 Ratio <2.0 and average HER2 copy number 4.0 and <6.0 POSITIVE HER2/CEP17 Ratio >=2.0 or <2.0 and average HER2 copy number >=6.0 Enid Cutter MD Pathologist, Electronic Signature ( Signed 04/30/2016)   PROCEDURES  ECHO 06/10/2017 LV EF: 55% -   60%  ECHO 03/12/17 Impressions: - Normal LV size with EF 55-60%. Strain as above. Normal RV size   and systolic function. No significant valvular abnormalities.  ECHO 11/21/16  Impressions: - Normal LV size and systolic function, EF 35-57%. Normal diastolic   function. Global longitudinal strain less negative than prior but   images not as clean. Normal RV size and systolic function.  Echo 05/09/16 Study Conclusions -LF EF: 55-60% - Left ventricle: The cavity size was normal. There was mild focal   basal hypertrophy of the septum. Indeterminant diastolic   function. Systolic function was normal. The estimated ejection   fraction was in the range of 55% to 60%. Wall motion was normal;   there were no regional wall motion abnormalities. GLS abnormal at   -13.3%, poor images however.   RADIOGRAPHIC  STUDIES: I have personally reviewed the radiological images as listed and agreed with the findings in the report.  07/20/2017 DEXA T-score 0.4  MRI Breast Bilateral 09/17/16 IMPRESSION: Smaller left breast mass, now measuring 2.5 cm. No new or suspicious enhancement in either breast. RECOMMENDATION: Treatment plan.  MRI Abdomen W WO Contrast 08/16/16 IMPRESSION: Tiny sub-cm hepatic cysts. No evidence of metastatic disease or other acute findings. Tiny benign left adrenal adenoma and right adrenal myelolipoma.  Mammogram 08/09/16 IMPRESSION:  The 3.1 cm x 2 cm x 1.5 cm irregular equal density mass in the left breast is consistent with the known carcinoma showing mammographic evidence of preoperative chemotherapy response.   [place] of left breast 08/09/16 IMPRESSION:  The 2.7 cm lobulated mass in the left breast is a known biopsy positive for malignancy. Surgical and oncology consult in progress.   See onc history CT ABD/Pelvi 5/10/18s W CONTRAST 06/07/16 IMPRESSION: 1. Wall thickening of the descending and sigmoid colon consistent with an infectious or inflammatory colitis. 2. There is also a short segment of narrowing and possible wall thickening of the superior  ascending colon which could reflect a constricting mass or, more likely, be an area of persistent colonic spasm. This could be further assessed with either colonoscopy or a barium enema after the current symptoms of colitis have resolved. 3. Small, subcentimeter, low-density liver lesions which may all be benign. However, 3 these are not evident on prior CT. Liver metastatic disease possible. These could be further assessed with liver MRI with and without contrast. 4. Adrenal lesions which are stable, on the right and myelolipoma and on the left most likely an adenoma.  CT Angio Chest PE W and/or wo Contrast 05/13/16 IMPRESSION: No evidence of pulmonary emboli.  ASSESSMENT & PLAN:  57 y.o. post-menopausal Caucasian  female with a self palpated left breast mass.  1. Breast cancer of upper-outer quadrant of left breast, invasive ductal carcinoma,  stage IIA (cT2N0M0) grade 3, ER-, PR-, HER2 amplified, ypT2N1a, ER30% weakly+, PR-, HER2+ -We previously discussed that surgical resection is the definitive treatment for breast cancer, she was seen by breast surgeon Dr. Lucia Dickson, lumpectomy versus mastectomy were discussed with patient. -We previously discussed HER2 positive breast cancers total approximately 15% of breast cancers and happens to be more aggressive than HER2 negative cancers, especially ER and PR negative disease, she has high likelihood of cancer recurrence after complete surgical resection.  -I underwent neoadjuvant chemotherapy TCHP (docetaxel, carboplatin, Herceptin and perjeta) every 3 weeks for 6 cycles (04/2016-08/2016) followed by maintenance Herceptin and perjeta to complete 1 year therapy, since 08/2016 and completed in 05/10/17. -Her baseline echo was normal, she was seen by cardiologist Dr. Benjamine Mola -She unfortunately developed severe diarrhea and cytopenia after chemotherapy, and was hospitalized for several times. Chemotherapy was subsequently postponed and dose reduced, and stopped after 4 months treatment. -She underwent left breast lumpectomy on 10/09/16 and the pathology results show significant residual disease, RCB-III -Due to her residual cancer and positive lymph node I also recommend adjuvant chemoradiation with Xeloda for 6 months. She completed chemoradiation 11/22/16-01/10/17. And proceeded to complete Xeloda in 05/15/17.  -Echo 11/21/16 LVEF 23-30%, normal systolic and diastolic function -I also discussed the option of Nerlynx with her previously and we decided not to pursue due to the small benefit and potential severe diarrhea  -She attended Survivorship clinic on 06/28/17 -I reviewed her pathology results previously, her initial biopsy sample was ER/PR negative and her surgical sample  was found to be 30% ER weakly positive so she will benefit from antiestrogen therapy to further reduce risk of recurrence. Her options are Tamoxifen, Anastrozole, Letrozole or Exemestane.  She is postmenopausal. --She has started adjuvant anastrozole, tolerating very well Dickson. -I again encouraged her to continue exercise, and try to lose more weight.  She has lost 15 lbs lately. -Reviewed labs with the patient today, stable. CMP PENDING.  -Advised the patient to obtain her flu vaccination and for the next 3 years due to recent chemotherapy.  -Continue on anastrozole.  -Lab and f/u in 4 months  2. Genetics -I recommended her to seek genetic counseling given her strong family history of breast and colon cancer which is very suspicious of Lynch Syndrome or other inheritable cancer syndromes. I explained based on her genetic results this may change how often she gets cancer screenings. She has no children. -She understands and still declined genetic testing. She knows to continue cancer screenings. -I recommend she share her family history of colon cancer with Dr. Fuller Plan as she may need more frequent screenings.   3. Type 2 Diabetes mellitus, HTN -Managed by  her PCP. -The patient has peripheral neuropathy in her feet from her diabetes. The patient is already on Neurontin with 200 mg at night. We previously discussed that chemotherapy may make her neuropathy worse. -Steroids will be given to reduce chemo side effects and I will reduce dexa to 71m daily to not affect her blood sugar much. -DM and HTN, controlled.   4. Ulcerative colitis and chronic diarrhea  -We previously discussed that chemotherapy would cause diarrhea and the patient's colitis may exacerbate during chemo  -She is not taking steroids for this. She is on mesalamine, will follow up with Dr. SFuller Plan -I previously advised the patient to keep herself adequately hydrated and to take Imodium PRN. -She was hospitalized on 05/31/17 for a  lower GI bleed and was given vancomycin to treat as this was suspected to be related to her UC. She is C.Diff antigen carrier positive. There was no suspicion for cancer on her CT scan.   -Continue to follow up with GI, I recommend a repeat coloscopy within the next year.  -Her diarrhea is controlled with imodium and lomotil, manageable.   5. Arthritis  -s/p b/l hip replacement, last surgery in February 2018.  -I previously encouraged her to f/u with PCP for arthritic pain management -Will monitor while on Anastrozole.   6. Peripheral Neuropathy, secondary to chemotherapy  -neuropathy is worse following bouts of typing. I previously encouraged her to use her left hand more often and begin wearing cotton gloves.   -She is currently on Gabapentin for her baseline neuropathy associated w/ her DM, she currently takes this TID.  -She takes 200 mg Neurontin at night, may increase to 300-400 mg at bedtime as needed. Continue Vitamin B12. -Neuropathy stable.    7. Hypokalemia  -secondary to chemotherapy  -She now takes potassium BID. I advised her to increase K if she experiences diarrhea. I also suggest she increase potassium in her diet.  -Her latest potassium from 06/28/17 was 3.3. Continue oral potassium.  -Potassium level PENDING today, 10/08/2017   8. Bone Health  -I discussed anastrozole may weaken her bone so I recommend monitoring this with DEXA scans every other years  -Her last DEXA on 07/20/17 was WNLs   PLAN -Continue anastrozole -Lab and f/u in 4 months    No orders of the defined types were placed in this encounter.  All questions were answered. The patient knows to call the clinic with any problems, questions or concerns.  I spent 15 minutes counseling the patient face to face. The total time spent in the appointment was 20 minutes and more than 50% was on counseling.      YTruitt Merle MD 10/08/2017 11:09 AM   I, Soijett Blue am acting as scribe for Dr. YTruitt Dickson  I  have reviewed the above documentation for accuracy and completeness, and I agree with the above.

## 2017-10-08 ENCOUNTER — Inpatient Hospital Stay (HOSPITAL_BASED_OUTPATIENT_CLINIC_OR_DEPARTMENT_OTHER): Payer: 59 | Admitting: Hematology

## 2017-10-08 ENCOUNTER — Inpatient Hospital Stay: Payer: 59 | Attending: Hematology

## 2017-10-08 ENCOUNTER — Encounter: Payer: Self-pay | Admitting: Hematology

## 2017-10-08 ENCOUNTER — Telehealth: Payer: Self-pay | Admitting: Hematology

## 2017-10-08 VITALS — BP 136/79 | HR 69 | Temp 98.2°F | Resp 18 | Ht 64.0 in | Wt 184.0 lb

## 2017-10-08 DIAGNOSIS — C50412 Malignant neoplasm of upper-outer quadrant of left female breast: Secondary | ICD-10-CM

## 2017-10-08 DIAGNOSIS — C773 Secondary and unspecified malignant neoplasm of axilla and upper limb lymph nodes: Secondary | ICD-10-CM | POA: Insufficient documentation

## 2017-10-08 DIAGNOSIS — Z170 Estrogen receptor positive status [ER+]: Secondary | ICD-10-CM

## 2017-10-08 DIAGNOSIS — Z79811 Long term (current) use of aromatase inhibitors: Secondary | ICD-10-CM | POA: Diagnosis not present

## 2017-10-08 DIAGNOSIS — Z171 Estrogen receptor negative status [ER-]: Principal | ICD-10-CM

## 2017-10-08 DIAGNOSIS — E119 Type 2 diabetes mellitus without complications: Secondary | ICD-10-CM

## 2017-10-08 DIAGNOSIS — E876 Hypokalemia: Secondary | ICD-10-CM

## 2017-10-08 DIAGNOSIS — E1142 Type 2 diabetes mellitus with diabetic polyneuropathy: Secondary | ICD-10-CM | POA: Insufficient documentation

## 2017-10-08 DIAGNOSIS — I10 Essential (primary) hypertension: Secondary | ICD-10-CM | POA: Diagnosis not present

## 2017-10-08 LAB — CBC WITH DIFFERENTIAL/PLATELET
BASOS ABS: 0 10*3/uL (ref 0.0–0.1)
BASOS PCT: 1 %
Eosinophils Absolute: 0.2 10*3/uL (ref 0.0–0.5)
Eosinophils Relative: 6 %
HCT: 40 % (ref 34.8–46.6)
Hemoglobin: 12.9 g/dL (ref 11.6–15.9)
Lymphocytes Relative: 30 %
Lymphs Abs: 1.1 10*3/uL (ref 0.9–3.3)
MCH: 27.5 pg (ref 25.1–34.0)
MCHC: 32.3 g/dL (ref 31.5–36.0)
MCV: 85.1 fL (ref 79.5–101.0)
Monocytes Absolute: 0.3 10*3/uL (ref 0.1–0.9)
Monocytes Relative: 10 %
NEUTROS ABS: 2 10*3/uL (ref 1.5–6.5)
Neutrophils Relative %: 53 %
PLATELETS: 227 10*3/uL (ref 145–400)
RBC: 4.7 MIL/uL (ref 3.70–5.45)
RDW: 13.4 % (ref 11.2–14.5)
WBC: 3.6 10*3/uL — ABNORMAL LOW (ref 3.9–10.3)

## 2017-10-08 LAB — COMPREHENSIVE METABOLIC PANEL
ALBUMIN: 3.2 g/dL — AB (ref 3.5–5.0)
ALK PHOS: 77 U/L (ref 38–126)
ALT: 9 U/L (ref 0–44)
ANION GAP: 7 (ref 5–15)
AST: 12 U/L — ABNORMAL LOW (ref 15–41)
BILIRUBIN TOTAL: 0.6 mg/dL (ref 0.3–1.2)
BUN: 9 mg/dL (ref 6–20)
CALCIUM: 9.5 mg/dL (ref 8.9–10.3)
CO2: 29 mmol/L (ref 22–32)
Chloride: 105 mmol/L (ref 98–111)
Creatinine, Ser: 0.96 mg/dL (ref 0.44–1.00)
GFR calc Af Amer: >60 mL/min (ref >60)
GFR calc non Af Amer: >60 mL/min (ref >60)
GLUCOSE: 100 mg/dL — AB (ref 70–99)
Potassium: 3.6 mmol/L (ref 3.5–5.1)
Sodium: 141 mmol/L (ref 135–145)
TOTAL PROTEIN: 7.3 g/dL (ref 6.5–8.1)

## 2017-10-08 MED ORDER — ANASTROZOLE 1 MG PO TABS: 1.0000 mg | ORAL_TABLET | Freq: Every day | ORAL | 5 refills | Status: DC

## 2017-10-08 NOTE — Telephone Encounter (Signed)
Gave pt avs and calendar  °

## 2017-10-16 DIAGNOSIS — I10 Essential (primary) hypertension: Secondary | ICD-10-CM | POA: Diagnosis not present

## 2017-10-16 DIAGNOSIS — E782 Mixed hyperlipidemia: Secondary | ICD-10-CM | POA: Diagnosis not present

## 2017-10-16 DIAGNOSIS — N183 Chronic kidney disease, stage 3 (moderate): Secondary | ICD-10-CM | POA: Diagnosis not present

## 2017-10-16 DIAGNOSIS — E1122 Type 2 diabetes mellitus with diabetic chronic kidney disease: Secondary | ICD-10-CM | POA: Diagnosis not present

## 2017-11-14 DIAGNOSIS — Z23 Encounter for immunization: Secondary | ICD-10-CM | POA: Diagnosis not present

## 2017-11-28 ENCOUNTER — Emergency Department (HOSPITAL_COMMUNITY): Payer: 59

## 2017-11-28 ENCOUNTER — Encounter (HOSPITAL_COMMUNITY): Payer: Self-pay | Admitting: Emergency Medicine

## 2017-11-28 ENCOUNTER — Emergency Department (HOSPITAL_COMMUNITY)
Admission: EM | Admit: 2017-11-28 | Discharge: 2017-11-29 | Disposition: A | Discharge status: Home / Self Care | Payer: 59 | Attending: Emergency Medicine | Admitting: Emergency Medicine

## 2017-11-28 DIAGNOSIS — R200 Anesthesia of skin: Secondary | ICD-10-CM

## 2017-11-28 DIAGNOSIS — Z7982 Long term (current) use of aspirin: Secondary | ICD-10-CM | POA: Insufficient documentation

## 2017-11-28 DIAGNOSIS — Z96643 Presence of artificial hip joint, bilateral: Secondary | ICD-10-CM | POA: Insufficient documentation

## 2017-11-28 DIAGNOSIS — Z79899 Other long term (current) drug therapy: Secondary | ICD-10-CM | POA: Insufficient documentation

## 2017-11-28 DIAGNOSIS — Z853 Personal history of malignant neoplasm of breast: Secondary | ICD-10-CM | POA: Diagnosis not present

## 2017-11-28 DIAGNOSIS — I10 Essential (primary) hypertension: Secondary | ICD-10-CM | POA: Insufficient documentation

## 2017-11-28 DIAGNOSIS — Z7984 Long term (current) use of oral hypoglycemic drugs: Secondary | ICD-10-CM | POA: Diagnosis not present

## 2017-11-28 DIAGNOSIS — Z87891 Personal history of nicotine dependence: Secondary | ICD-10-CM | POA: Insufficient documentation

## 2017-11-28 DIAGNOSIS — E119 Type 2 diabetes mellitus without complications: Secondary | ICD-10-CM | POA: Insufficient documentation

## 2017-11-28 DIAGNOSIS — J45909 Unspecified asthma, uncomplicated: Secondary | ICD-10-CM | POA: Diagnosis not present

## 2017-11-28 DIAGNOSIS — R918 Other nonspecific abnormal finding of lung field: Secondary | ICD-10-CM | POA: Diagnosis not present

## 2017-11-28 DIAGNOSIS — R531 Weakness: Secondary | ICD-10-CM | POA: Diagnosis not present

## 2017-11-28 LAB — COMPREHENSIVE METABOLIC PANEL
ALBUMIN: 3.7 g/dL (ref 3.5–5.0)
ALK PHOS: 71 U/L (ref 38–126)
ALT: 11 U/L (ref 0–44)
ANION GAP: 8 (ref 5–15)
AST: 16 U/L (ref 15–41)
BUN: 12 mg/dL (ref 6–20)
CO2: 27 mmol/L (ref 22–32)
Calcium: 8.9 mg/dL (ref 8.9–10.3)
Chloride: 105 mmol/L (ref 98–111)
Creatinine, Ser: 0.85 mg/dL (ref 0.44–1.00)
GFR calc Af Amer: >60 mL/min (ref >60)
GFR calc non Af Amer: >60 mL/min (ref >60)
GLUCOSE: 92 mg/dL (ref 70–99)
POTASSIUM: 3.5 mmol/L (ref 3.5–5.1)
SODIUM: 140 mmol/L (ref 135–145)
Total Bilirubin: 0.6 mg/dL (ref 0.3–1.2)
Total Protein: 7.5 g/dL (ref 6.5–8.1)

## 2017-11-28 LAB — URINALYSIS, ROUTINE W REFLEX MICROSCOPIC
Bilirubin Urine: NEGATIVE
Glucose, UA: NEGATIVE mg/dL
Hgb urine dipstick: NEGATIVE
Ketones, ur: NEGATIVE mg/dL
Nitrite: NEGATIVE
Protein, ur: NEGATIVE mg/dL
SPECIFIC GRAVITY, URINE: 1.011 (ref 1.005–1.030)
pH: 6 (ref 5.0–8.0)

## 2017-11-28 LAB — DIFFERENTIAL
Abs Immature Granulocytes: 0.03 10*3/uL (ref 0.00–0.07)
BASOS ABS: 0 10*3/uL (ref 0.0–0.1)
Basophils Relative: 1 %
EOS ABS: 0.3 10*3/uL (ref 0.0–0.5)
EOS PCT: 5 %
IMMATURE GRANULOCYTES: 1 %
LYMPHS ABS: 2.1 10*3/uL (ref 0.7–4.0)
LYMPHS PCT: 34 %
Monocytes Absolute: 0.6 10*3/uL (ref 0.1–1.0)
Monocytes Relative: 9 %
Neutro Abs: 3.2 10*3/uL (ref 1.7–7.7)
Neutrophils Relative %: 50 %

## 2017-11-28 LAB — CBC
HEMATOCRIT: 40.7 % (ref 36.0–46.0)
Hemoglobin: 12.5 g/dL (ref 12.0–15.0)
MCH: 26.7 pg (ref 26.0–34.0)
MCHC: 30.7 g/dL (ref 30.0–36.0)
MCV: 87 fL (ref 80.0–100.0)
Platelets: 244 10*3/uL (ref 150–400)
RBC: 4.68 MIL/uL (ref 3.87–5.11)
RDW: 13.6 % (ref 11.5–15.5)
WBC: 6.2 10*3/uL (ref 4.0–10.5)
nRBC: 0 % (ref 0.0–0.2)

## 2017-11-28 LAB — I-STAT TROPONIN, ED: Troponin i, poc: 0 ng/mL (ref 0.00–0.08)

## 2017-11-28 LAB — RAPID URINE DRUG SCREEN, HOSP PERFORMED
AMPHETAMINES: NOT DETECTED
BARBITURATES: NOT DETECTED
Benzodiazepines: NOT DETECTED
COCAINE: NOT DETECTED
OPIATES: NOT DETECTED
TETRAHYDROCANNABINOL: NOT DETECTED

## 2017-11-28 LAB — PROTIME-INR
INR: 0.92
PROTHROMBIN TIME: 12.3 s (ref 11.4–15.2)

## 2017-11-28 LAB — APTT: aPTT: 31 seconds (ref 24–36)

## 2017-11-28 LAB — ETHANOL: Alcohol, Ethyl (B): <10 mg/dL (ref <10)

## 2017-11-28 MED ORDER — GADOBUTROL 1 MMOL/ML IV SOLN
8.0000 mL | Freq: Once | INTRAVENOUS | Status: AC | PRN
  Administered 2017-11-28: 8 mL via INTRAVENOUS

## 2017-11-28 MED ORDER — ONDANSETRON 4 MG PO TBDP: 4.0000 mg | ORAL_TABLET | Freq: Three times a day (TID) | ORAL | 0 refills | Status: DC | PRN

## 2017-11-28 MED ORDER — LORAZEPAM 2 MG/ML IJ SOLN
1.0000 mg | Freq: Once | INTRAMUSCULAR | Status: AC
  Administered 2017-11-28: 1 mg via INTRAVENOUS
  Filled 2017-11-28: qty 1

## 2017-11-28 MED ORDER — MECLIZINE HCL 25 MG PO TABS: 25.0000 mg | ORAL_TABLET | Freq: Three times a day (TID) | ORAL | 0 refills | Status: DC | PRN

## 2017-11-28 NOTE — ED Provider Notes (Signed)
[facility] DEPT Provider Note   CSN: [id number] Arrival date & time: 11/28/17  1849     History   Chief Complaint Chief Complaint  Patient presents with  . Numbness    HPI Brandi Dickson is a 57 y.o. female.  Pt presents to the ED today with numbness to the right side of her face.  The pt said it started around 1600.  She said it is getting much better.  Pt feels like she is having trouble walking, but is moving everything.  The pt has missed work twice this week for vertigo.     Past Medical History:  Diagnosis Date  . Arthritis   . Asthma    triggered with [name] perfumes and cigarette smoke  . Cancer ([place])   . Colitis   . Diabetes mellitus without complication ([place])   . History of radiation therapy 11/22/16-01/10/17   left breast 50.4 Gy in 28 fractions, axillary region 45 Gy in 25 fractions, lumpectomy cavity boost 10 Gy tin 5 fractions  . Hypertension   . Neuropathy     Patient Active Problem List   Diagnosis Date Noted  . Ulcerative colitis with rectal bleeding ([place]) 05/31/2017  . Ulcerative colitis with complication ([place]) 82/50/5397  . Lactic acidosis 07/19/2016  . Antineoplastic chemotherapy induced pancytopenia ([place])   . GIB (gastrointestinal bleeding) 06/07/2016  . Hypertension   . Diabetes mellitus without complication ([place])   . Ulcerative colitis without complications ([place]) 67/34/1937  . Hematochezia   . Generalized abdominal pain   . Nausea vomiting and diarrhea   . Diarrhea of presumed infectious origin   . Hyponatremia 05/18/2016  . Hypokalemia 05/18/2016  . Hypomagnesemia 05/18/2016  . Dehydration 05/18/2016  . Blood in stool 05/18/2016  . Thrombocytopenia ([place]) 05/18/2016  . GI bleeding 05/18/2016  . Port catheter in place 05/11/2016  . Breast cancer of upper-outer quadrant of left female breast ([place]) 05/02/2016  . S/P left THA, AA 03/06/2016  . Rectal bleeding 07/14/2015  . LLQ abdominal pain  07/14/2015  . Special screening for malignant neoplasms, colon 07/14/2015  . Loss of weight 07/14/2015  . Family history of colon cancer 07/14/2015    Past Surgical History:  Procedure Laterality Date  . ABDOMINAL HYSTERECTOMY    . BREAST LUMPECTOMY WITH RADIOACTIVE SEED AND SENTINEL LYMPH NODE BIOPSY Left 10/09/2016   Procedure: LEFT BREAST LUMPECTOMY WITH RADIOACTIVE SEED AND L4EFT AXILLARY SENTINEL LYMPH NODE BIOPSY;  Surgeon: [name], MD;  Location: [facility];  Service: General;  Laterality: Left;  . DILATION AND CURETTAGE OF UTERUS    . KNEE ARTHROSCOPY Left   . PORTACATH PLACEMENT Right 05/08/2016   Procedure: INSERTION PORT-A-CATH WITH [place];  Surgeon: [name], MD;  Location: [facility];  Service: General;  Laterality: Right;  . TONSILLECTOMY    . TOTAL HIP ARTHROPLASTY Right   . TOTAL HIP ARTHROPLASTY Left 03/06/2016   Procedure: LEFT TOTAL HIP ARTHROPLASTY ANTERIOR APPROACH;  Surgeon: [name], MD;  Location: [facility];  Service: Orthopedics;  Laterality: Left;     OB History   None      Home Medications    Prior to Admission medications   Medication Sig Start Date End Date Taking? Authorizing Provider  albuterol (PROVENTIL HFA;VENTOLIN HFA) 108 (90 Base) MCG/ACT inhaler Inhale 1-2 puffs into the lungs every 6 (six) hours as needed for wheezing or shortness of breath.   Yes [provider]  anastrozole (ARIMIDEX) 1 MG tablet  Take 1 tablet (1 mg total) by mouth daily. 10/08/17  Yes Truitt Merle, MD  aspirin EC 81 MG tablet Take 81 mg by mouth 2 (two) times daily.   Yes [provider]  dicyclomine (BENTYL) 20 MG tablet Take 1 tablet (20 mg total) by mouth 4 (four) times daily -  before meals and at bedtime. Patient taking differently: Take 20 mg by mouth 3 (three) times daily as needed for spasms.  06/05/17  Yes Truitt Merle, MD  gabapentin (NEURONTIN) 100 MG capsule Take 200 mg by mouth at bedtime.    Yes [provider]  Garlic (GARLIQUE) 371 MG TBEC Take 400 mg by mouth daily.   Yes [provider]  Ginkgo Biloba Extract 120 MG CAPS Take 120 mg by mouth daily with breakfast.   Yes [provider]  loperamide (IMODIUM) 2 MG capsule Take 4 mg by mouth as needed for diarrhea or loose stools.   Yes [provider]  Mesalamine 800 MG TBEC 2 TABLETS BY MOUTH THREE TIMES A DAY Patient taking differently: Take 2 tablets by mouth 2 (two) times daily.  07/26/17  Yes Ladene Artist, MD  metFORMIN (GLUCOPHAGE-XR) 500 MG 24 hr tablet Take 1,000 mg by mouth at bedtime.   Yes [provider]  metoprolol (LOPRESSOR) 50 MG tablet Take 50 mg by mouth 2 (two) times daily.    Yes [provider]  potassium chloride SA (KLOR-CON M20) 20 MEQ tablet Take 1 tablet (20 mEq total) by mouth 2 (two) times daily. Patient taking differently: Take 20 mEq by mouth daily with breakfast.  05/10/17  Yes Truitt Merle, MD  Probiotic Product (PROBIOTIC-10) CAPS Take 2 capsules by mouth daily with breakfast.   Yes [provider]  triamterene-hydrochlorothiazide (MAXZIDE) 75-50 MG per tablet Take 0.5 tablets by mouth daily with breakfast.    Yes [provider]  diphenoxylate-atropine (LOMOTIL) 2.5-0.025 MG tablet TAKE 1 TO 2 TABLETS BY MOUTH EVERY 6 HOURS AS NEEDED FOR DIARRHEA Patient not taking: Reported on 11/28/2017 07/09/17   Alla Feeling, NP  meclizine (ANTIVERT) 25 MG tablet Take 1 tablet (25 mg total) by mouth 3 (three) times daily as needed for dizziness. 11/28/17   Isla Pence, MD  ondansetron (ZOFRAN ODT) 4 MG disintegrating tablet Take 1 tablet (4 mg total) by mouth every 8 (eight) hours as needed. 11/28/17   Isla Pence, MD    Family History Family History  Problem Relation Age of Onset  . Colon cancer Father   . Stomach cancer Paternal Uncle   . Stomach cancer Paternal Uncle   . Melanoma Brother   . Thyroid cancer Brother   . Breast cancer Maternal Aunt     . Breast cancer Maternal Aunt   . Breast cancer Cousin     Social History Social History   Tobacco Use  . Smoking status: Former Smoker    Packs/day: 1.00    Years: 29.00    Pack years: 29.00    Types: Cigarettes    Last attempt to quit: 10/27/2002    Years since quitting: 15.0  . Smokeless tobacco: Never Used  Substance Use Topics  . Alcohol use: No    Alcohol/week: 0.0 standard drinks  . Drug use: No     Allergies   Gadavist [gadobutrol]; Lisinopril; Augmentin [amoxicillin-pot clavulanate]; Benadryl [diphenhydramine]; and Losartan potassium   Review of Systems Review of Systems  Neurological: Positive for numbness.  All other systems reviewed and are negative.  Physical Exam Updated Vital Signs BP (!) 144/78 (BP Location: Right Arm)   Pulse 65   Temp 97.8 F (36.6 C) (Oral)   Resp 18   SpO2 100%   Physical Exam  Constitutional: She is oriented to person, place, and time. She appears well-developed and well-nourished.  HENT:  Head: Normocephalic and atraumatic.  Right Ear: External ear normal.  Left Ear: External ear normal.  Nose: Nose normal.  Mouth/Throat: Oropharynx is clear and moist.  Eyes: Pupils are equal, round, and reactive to light. Conjunctivae and EOM are normal.  Neck: Normal range of motion. Neck supple.  Cardiovascular: Normal rate, regular rhythm, normal heart sounds and intact distal pulses.  Pulmonary/Chest: Effort normal and breath sounds normal.  Abdominal: Soft. Bowel sounds are normal.  Musculoskeletal: Normal range of motion.  Neurological: She is alert and oriented to person, place, and time.  Skin: Skin is warm. Capillary refill takes less than 2 seconds.  Psychiatric: She has a normal mood and affect. Her behavior is normal. Judgment and thought content normal.  Nursing note and vitals reviewed.    ED Treatments / Results  Labs (all labs ordered are listed, but only abnormal results are displayed) Labs Reviewed   URINALYSIS, ROUTINE W REFLEX MICROSCOPIC - Abnormal; Notable for the following components:      Result Value   Leukocytes, UA TRACE (*)    Bacteria, UA RARE (*)    All other components within normal limits  ETHANOL  PROTIME-INR  APTT  CBC  DIFFERENTIAL  COMPREHENSIVE METABOLIC PANEL  RAPID URINE DRUG SCREEN, HOSP PERFORMED  I-STAT TROPONIN, ED    EKG EKG Interpretation  Date/Time:  Thursday November 28 2017 20:38:51 EDT Ventricular Rate:  68 PR Interval:    QRS Duration: 158 QT Interval:  427 QTC Calculation: 461 R Axis:   91 Text Interpretation:  Nonspecific intraventricular conduction delay Consider anterior infarct Borderline ST depression, lateral leads Baseline wander in lead(s) V6 No significant change since last tracing Confirmed by Isla Pence [phone]) on 11/28/2017 8:43:38 PM   Radiology Dg Chest 2 View  Result Date: 11/28/2017 CLINICAL DATA:  Facial numbness.  Weakness. EXAM: CHEST - 2 VIEW COMPARISON:  July 19, 2016 FINDINGS: A nodular density at the left base could represent a nipple shadow. No pneumothorax. The heart, hila, mediastinum, lungs, and pleura are otherwise unremarkable. IMPRESSION: The nodular density over the left base may represent a nipple shadow. Recommend repeat imaging with nipple markers. Electronically Signed   By: Dorise Bullion III M.D   On: 11/28/2017 20:37   Mr Brain W And Wo Contrast  Result Date: 11/28/2017 CLINICAL DATA:  Initial evaluation for acute right-sided facial numbness extending into right neck. EXAM: MRI HEAD WITHOUT AND WITH CONTRAST TECHNIQUE: Multiplanar, multiecho pulse sequences of the brain and surrounding structures were obtained without and with intravenous contrast. CONTRAST:  80 cc of Gadavist. COMPARISON:  None available. FINDINGS: Brain: Examination moderately degraded by motion artifact. Cerebral volume within normal limits for patient age. No focal parenchymal signal abnormality identified. No abnormal foci of  restricted diffusion to suggest acute or subacute ischemia. Gray-white matter differentiation well maintained. No encephalomalacia to suggest chronic infarction. No foci of susceptibility artifact to suggest acute or chronic intracranial hemorrhage. Mass lesion, midline shift or mass effect. No hydrocephalus. No extra-axial fluid collection. Major dural sinuses are grossly patent. Pituitary gland and suprasellar region are normal. Midline structures intact and normal. No abnormal enhancement. Vascular: Major intracranial vascular flow voids well maintained and normal  in appearance. Skull and upper cervical spine: Craniocervical junction normal. Visualized upper cervical spine within normal limits. Bone marrow signal intensity normal. Mild hyperostosis frontalis interna noted. No scalp soft tissue abnormality. Sinuses/Orbits: Globes and orbital soft tissues within normal limits. Moderate mucosal thickening within the left maxillary sinus. Paranasal sinuses are otherwise clear. No mastoid effusion. Inner ear structures normal. Other: None. IMPRESSION: Normal brain MRI.  No acute intracranial abnormality identified. Electronically Signed   By: Jeannine Boga M.D.   On: 11/28/2017 23:04    Procedures Procedures (including critical care time)  Medications Ordered in ED Medications  LORazepam (ATIVAN) injection 1 mg (has no administration in time range)  LORazepam (ATIVAN) injection 1 mg (1 mg Intravenous Given 11/28/17 2059)  gadobutrol (GADAVIST) 1 MMOL/ML injection 8 mL (8 mLs Intravenous Contrast Given 11/28/17 2158)     Initial Impression / Assessment and Plan / ED Course  I have reviewed the triage vital signs and the nursing notes.  Pertinent labs & imaging results that were available during my care of the patient were reviewed by me and considered in my medical decision making (see chart for details).  Pt required ativan before getting MRI.  Pt did vomit when receiving MRI dye.  She is  feeling better now.   No evidence of stroke on MRI.  Pt to f/u with neurology as an outpatient.  Return if worse.  Final Clinical Impressions(s) / ED Diagnoses   Final diagnoses:  Right facial numbness    ED Discharge Orders         Ordered    meclizine (ANTIVERT) 25 MG tablet  3 times daily PRN     11/28/17 2311    ondansetron (ZOFRAN ODT) 4 MG disintegrating tablet  Every 8 hours PRN     11/28/17 2311           Isla Pence, MD 11/28/17 2314

## 2017-11-28 NOTE — ED Notes (Signed)
Vitals update delayed.  Pt still at MRI

## 2017-11-28 NOTE — ED Notes (Signed)
Patient transported to MRI 

## 2017-11-28 NOTE — ED Triage Notes (Signed)
Pt reports that today around 4-430p she was at work when right sided facial numbness that goes down right side of neck. Pt denies any arm weakness or leg weakness. Pt feels like she needs to hold on to her husband when walking.

## 2018-01-23 NOTE — Progress Notes (Signed)
Brandi Dickson   [phone]) [phone] Fax:(336) [phone]   Clinic Follow up Note   Patient Care Team: Brandi Melter, MD as PCP - General (Family Medicine) Brandi Overall, MD as Consulting Physician (General Surgery) Brandi Merle, MD as Consulting Physician (Hematology) Brandi Pray, MD as Consulting Physician (Radiation Oncology) Brandi Artist, MD as Consulting Physician (Gastroenterology) Brandi Bison Charlestine Massed, NP as Nurse Practitioner (Hematology and Oncology)  Date of Service:  01/24/2018  CHIEF COMPLAINT: F/u of left breast cancer  SUMMARY OF ONCOLOGIC HISTORY: Oncology History   Cancer Staging Breast cancer of upper-outer quadrant of left female breast [name]) Staging form: Breast, AJCC 8th Edition - Clinical stage from 04/25/2016: Stage IIA (cT2, cN0, cM0, G3, ER: Negative, PR: Negative, HER2: Positive) - Signed by Brandi Merle, MD on 05/02/2016 - Pathologic stage from 10/09/2016: No Stage Recommended (ypT2, pN1a, cM0, G3, ER+, PR-, HER2+) - Signed by Brandi Merle, MD on 07/11/2017       Breast cancer of upper-outer quadrant of left female breast ([name])   04/24/2016 Mammogram    Category B breasts with a new irregular mass in the UOQ left breast middle depth. Ultrasound revealed a 3.9 cm mass in the 1:00 position. The left axilla was negative.     04/25/2016 Initial Biopsy    Biopsy of the left breast showed grade 3 invasive ductal carcinoma, DCIS, and lymphovascular invasion was present.    04/25/2016 Receptors her2    ER 0% negative, PR 0% negative, HER2 positive, Ki67 30%    05/02/2016 Initial Diagnosis    Breast cancer of upper-outer quadrant of left female breast ([name])    05/07/2016 Imaging    MRI of the bilateral breast 05/07/16 IMPRESSION: Lobulated enhancing mass (4.0 x 2.7 x 3.1 cm) in the upper-outer quadrant of the left breast corresponding with the recently diagnosed invasive mammary carcinoma. Linear enhancement extends 2.6 cm posterior to the  mass worrisome for ductal carcinoma in-situ.    05/09/2016 Echocardiogram    Echo 05/09/16 -LF EF: 55-60%    05/11/2016 - 08/31/2016 Neo-Adjuvant Chemotherapy    Cycle 1 Docetaxel, Carboplatin, Herceptin and pejeta (TCHP), cycle 2 Docetaxel changed to Taxol, carbo dose reduced to AUC 4.5 (from 5) due to severe diarrhea and cytopenia, Perjeta held after cycle 2.   Due to poor toleration, Brandi Dickson was held after cycle 4 and Taxol changed to weekly X4. Herceptin was continued during the above chemo.      05/13/2016 Imaging    CT Angio Chest PE IMPRESSION: No evidence of pulmonary emboli. Left breast mass consistent with the given clinical history. Stable left adrenal lesion likely representing a small adenoma.    05/18/2016 - 05/24/2016 Hospital Admission    Patient presented with nausea, vomiting, and diarrhea; admitted to hospital with Hyponatremia    06/07/2016 - 06/09/2016 Hospital Admission    Patient presents to hospital complaints of rectal bleeding and abdominal cramps when defacating    06/07/2016 Imaging    CT ABDOMEN PELVIS W CONTRAST  IMPRESSION: 1. Wall thickening of the descending and sigmoid colon consistent with an infectious or inflammatory colitis. 2. There is also a short segment of narrowing and possible wall thickening of the superior ascending colon which could reflect a constricting mass or, more likely, be an area of persistent colonic spasm. This could be further assessed with either colonoscopy or a barium enema after the current symptoms of colitis have resolved. 3. Small, subcentimeter, low-density liver lesions which may all be benign. However, 3 these are  not evident on prior CT. Liver metastatic disease possible. These could be further assessed with liver MRI with and without contrast. 4. Adrenal lesions which are stable, on the right and myelolipoma and on the left most likely an adenoma.    06/07/2016 Imaging    CT A/P IMPRESSION: 1. Wall thickening of the  descending and sigmoid colon consistent with an infectious or inflammatory colitis. 2. There is also a short segment of narrowing and possible wall thickening of the superior ascending colon which could reflect a constricting mass or, more likely, be an area of persistent colonic spasm. This could be further assessed with either colonoscopy or a barium enema after the current symptoms of colitis have resolved. 3. Small, subcentimeter, low-density liver lesions which may all be benign. However, 3 these are not evident on prior CT. Liver metastatic disease possible. These could be further assessed with liver MRI with and without contrast. 4. Adrenal lesions which are stable, on the right and myelolipoma and on the left most likely an adenoma.    06/07/2016 - 06/09/2016 Hospital Admission    Diarrhea and rectal Bleeding    07/19/2016 - 07/20/2016 Hospital Admission    Diarrhea and dehydration    08/09/2016 Mammogram    Mammogram 08/09/16 IMPRESSION:  The 3.1 cm x 2 cm x 1.5 cm irregular equal density mass in the left breast is consistent with the known carcinoma showing mammographic evidence of preoperative chemotherapy response.       08/09/2016 Imaging    [place] of left breast 08/09/16 IMPRESSION:  The 2.7 cm lobulated mass in the left breast is a known biopsy positive for malignancy. Surgical and oncology consult in progress.    08/16/2016 Imaging    MRI Abdomen W WO Contrast IMPRESSION: Tiny sub-cm hepatic cysts. No evidence of metastatic disease or other acute findings. Tiny benign left adrenal adenoma and right adrenal myelolipoma.    09/14/2016 - 05/10/2017 Chemotherapy    Maintenance Herceptin every 3 weeks. Added Perjeta back on 10/26/16. Completed on 05/10/17    09/17/2016 Imaging    MRI Breast Bilateral 09/17/16 IMPRESSION: Smaller left breast mass, now measuring 2.5 cm. No new or suspicious enhancement in either breast.  RECOMMENDATION: Treatment plan.    10/09/2016 Surgery     LEFT BREAST LUMPECTOMY WITH RADIOACTIVE SEED AND L4EFT AXILLARY SENTINEL LYMPH NODE BIOPSY by Dr. Lucia Dickson on 10/09/16    10/09/2016 Pathology Results    Diagnosis 10/09/16 1. Breast, lumpectomy, Left - INVASIVE DUCTAL CARCINOMA, GRADE 3, SPANNING 2.4 CM. - HIGH GRADE DUCTAL CARCINOMA IN SITU WITH NECROSIS. - RESECTION MARGINS ARE NEGATIVE FOR CARCINOMA. - BIOPSY SITE. - SEE ONCOLOGY TABLE. 2. Breast, excision, Left additional medial margin - FIBROCYSTIC CHANGE. - NO MALIGNANCY IDENTIFIED. 3. Lymph node, sentinel, biopsy, Left Axillary - METASTATIC CARCINOMA IN ONE OF ONE LYMPH NODES (1/1).      11/22/2016 - 01/10/2017 Radiation Therapy    Radiation with Dr. Sondra Come and complete on 01/10/17    11/22/2016 - 05/15/2017 Chemotherapy    Xeloda With radiation she will be on low dose 4 tablets in the am and 3 tablets in the pm. Starting 11/22/16 and complete on 01/10/17.   Will continue Xeloda, 14 days on and 7 days off, 4 tablets BID starting 01/18/17 and completed on 05/15/17.  -Due to severe diarrhea, her Xeloda dose was decreased from 2000 mg twice daily to 2022m in am and 1504min pm on 02/11/17.      03/12/2017 Echocardiogram    ECHO 03/12/17  Impressions: - Normal LV size with EF 55-60%. Strain as above. Normal RV size   and systolic function. No significant valvular abnormalities.    04/18/2017 Mammogram    IMPRESSION:  New post treatment changes of the left breast. There is no mammographic evidence of malignancy. A follow-up mammogram in 12 months is recommended.     06/10/2017 Echocardiogram    ECHO 06/10/2017 LV EF: 55% -   60%    06/2017 -  Anti-estrogen oral therapy    Anastrozole 83m daily, starting 06/2017    07/20/2017 Imaging    DEXA Scan WNLs    07/20/2017 Imaging    DEXA Scan with T-score of 0.4      CURRENT THERAPY:  Anastrozole 160mdaily starting 06/2017   INTERVAL HISTORY:  ReSOFYA MOUSTAFAs here for a follow up of left breast cancer. She was last seen by  me on 10/08/17. Since her last visit she presented to ED for right facial numbness. Her brain MRI was normal.   She presents to the clinic today by herself. She notes she is doing well Dickson. She notes left breast pain the last 2 days that is shooting sore-like pain. She denies any other pain.  She has had a cold for the last few days and is now improving. This has effected her voice which is hoarse. She denies fever and is able to cough up phlegm. She notes her BM is more controlled with imodium and she is controlling her diet. She is able to lose weight because of this.  She notes having hot flashes with anastrozole but no joint pain. This is more tolerable. She still has her stable right knee arthritis.  I reviewed her medication list with her.     REVIEW OF SYSTEMS:  Constitutional: Denies fevers, chills (+) purposeful weight loss Eyes: Denies blurriness of vision Ears, nose, mouth, throat, and face: Denies mucositis or sore throat Respiratory: Denies dyspnea or wheezes (+) cold symptoms, cough with phlegm (+) hoarse voice Cardiovascular: Denies palpitation, chest discomfort or lower extremity swelling MSK: (+) right knee arthritis Gastrointestinal:  Denies nausea, heartburn or change in bowel habits Skin: Denies abnormal skin rashes Lymphatics: Denies new lymphadenopathy or easy bruising Neurological:Denies numbness, tingling or new weaknesses Behavioral/Psych: Mood is stable, no new changes  All other systems were reviewed with the patient and are negative.  MEDICAL HISTORY:  Past Medical History:  Diagnosis Date  . Arthritis   . Asthma    triggered with EsMindi Curlingerfumes and cigarette smoke  . [place]  . Colitis   . Diabetes mellitus without complication ([place]  . History of radiation therapy 11/22/16-01/10/17   left breast 50.4 Gy in 28 fractions, axillary region 45 Gy in 25 fractions, lumpectomy cavity boost 10 Gy tin 5 fractions  . Hypertension   . Neuropathy       SURGICAL HISTORY: Past Surgical History:  Procedure Laterality Date  . ABDOMINAL HYSTERECTOMY    . BREAST LUMPECTOMY WITH RADIOACTIVE SEED AND SENTINEL LYMPH NODE BIOPSY Left 10/09/2016   Procedure: LEFT BREAST LUMPECTOMY WITH RADIOACTIVE SEED AND L4EFT AXILLARY SENTINEL LYMPH NODE BIOPSY;  Surgeon: NeAlphonsa OverallMD;  [facility] Service: General;  Laterality: Left;  . DILATION AND CURETTAGE OF UTERUS    . KNEE ARTHROSCOPY Left   . PORTACATH PLACEMENT Right 05/08/2016   Procedure: INSERTION PORT-A-CATH WITH [place] Surgeon: DaAlphonsa OverallMD;  [place] [facility] Service: General;  Laterality:  Right;  [name] Kitchen TONSILLECTOMY    . TOTAL HIP ARTHROPLASTY Right   . TOTAL HIP ARTHROPLASTY Left 03/06/2016   Procedure: LEFT TOTAL HIP ARTHROPLASTY ANTERIOR APPROACH;  Surgeon: [name], MD;  Location: [facility];  Service: Orthopedics;  Laterality: Left;    I have reviewed the social history and family history with the patient and they are unchanged from previous note.  ALLERGIES:  is allergic to gadavist [gadobutrol]; lisinopril; augmentin [amoxicillin-pot clavulanate]; benadryl [diphenhydramine]; and losartan potassium.  MEDICATIONS:  Current Outpatient Medications  Medication Sig Dispense Refill  . albuterol (PROVENTIL HFA;VENTOLIN HFA) 108 (90 Base) MCG/ACT inhaler Inhale 1-2 puffs into the lungs every 6 (six) hours as needed for wheezing or shortness of breath.    . anastrozole (ARIMIDEX) 1 MG tablet Take 1 tablet (1 mg total) by mouth daily. 30 tablet 5  . aspirin EC 81 MG tablet Take 81 mg by mouth 2 (two) times daily.    [name] Kitchen dicyclomine (BENTYL) 20 MG tablet Take 1 tablet (20 mg total) by mouth 4 (four) times daily -  before meals and at bedtime. (Patient taking differently: Take 20 mg by mouth 3 (three) times daily as needed for spasms. ) 30 tablet 0  . gabapentin (NEURONTIN) 100 MG capsule Take 200 mg by mouth at bedtime.     . Garlic (GARLIQUE) 448 MG  TBEC Take 400 mg by mouth daily.    . Ginkgo Biloba Extract 120 MG CAPS Take 120 mg by mouth daily with breakfast.    . loperamide (IMODIUM) 2 MG capsule Take 4 mg by mouth as needed for diarrhea or loose stools.    . meclizine (ANTIVERT) 25 MG tablet Take 1 tablet (25 mg total) by mouth 3 (three) times daily as needed for dizziness. 30 tablet 0  . Mesalamine 800 MG TBEC 2 TABLETS BY MOUTH THREE TIMES A DAY (Patient taking differently: Take 2 tablets by mouth 2 (two) times daily. ) 180 tablet 2  . metFORMIN (GLUCOPHAGE-XR) 500 MG 24 hr tablet Take 1,000 mg by mouth at bedtime.    . metoprolol (LOPRESSOR) 50 MG tablet Take 50 mg by mouth 2 (two) times daily.     . potassium chloride SA (KLOR-CON M20) 20 MEQ tablet Take 1 tablet (20 mEq total) by mouth 2 (two) times daily. (Patient taking differently: Take 20 mEq by mouth daily with breakfast. ) 60 tablet 2  . Probiotic Product (PROBIOTIC-10) CAPS Take 2 capsules by mouth daily with breakfast.    . triamterene-hydrochlorothiazide (MAXZIDE) 75-50 MG per tablet Take 0.5 tablets by mouth daily with breakfast.      No current facility-administered medications for this visit.    Facility-Administered Medications Ordered in Other Visits  Medication Dose Route Frequency Provider Last Rate Last Dose  . heparin lock flush 100 unit/mL  500 Units Intracatheter Once PRN Brandi Merle, MD      . sodium chloride flush (NS) 0.9 % injection 10 mL  10 mL Intracatheter PRN Brandi Merle, MD      . sodium chloride flush (NS) 0.9 % injection 10 mL  10 mL Intracatheter PRN Brandi Merle, MD   10 mL at 08/24/16 1725  . sodium chloride flush (NS) 0.9 % injection 10 mL  10 mL Intracatheter PRN Brandi Merle, MD   10 mL at 11/16/16 1628    PHYSICAL EXAMINATION: ECOG PERFORMANCE STATUS: 0 - Asymptomatic  Vitals:   01/24/18 0812  BP: 130/74  Pulse: 73  Resp: 18  Temp: (!) 97.3 F (36.3  C)  SpO2: 98%   Filed Weights   01/24/18 0812  Weight: 173 lb 6.4 oz (78.7 kg)     GENERAL:alert, no distress and comfortable SKIN: skin color, texture, turgor are normal, no rashes or significant lesions EYES: normal, Conjunctiva are pink and non-injected, sclera clear OROPHARYNX:no exudate, no erythema and lips, buccal mucosa, and tongue normal  NECK: supple, non-tender, without nodularity (+) fullness at base of neck  LYMPH:  no palpable lymphadenopathy in the cervical, axillary or inguinal LUNGS: clear to auscultation and percussion with normal breathing effort HEART: regular rate & rhythm and no murmurs and no lower extremity edema ABDOMEN:abdomen soft, non-tender and normal bowel sounds Musculoskeletal:no cyanosis of digits and no clubbing  NEURO: alert & oriented x 3 with fluent speech, no focal motor/sensory deficits BREAST: s/p left breast lumpectomy: Surgical incision healed well with mild scar tissue. No palpable mass or adenopathy.   LABORATORY DATA:  I have reviewed the data as listed CBC Latest Ref Rng & Units 01/24/2018 11/28/2017 10/08/2017  WBC 4.0 - 10.5 K/uL 7.4 6.2 3.6(L)  Hemoglobin 12.0 - 15.0 g/dL 12.9 12.5 12.9  Hematocrit 36.0 - 46.0 % 42.0 40.7 40.0  Platelets 150 - 400 K/uL 305 244 227     CMP Latest Ref Rng & Units 01/24/2018 11/28/2017 10/08/2017  Glucose 70 - 99 mg/dL 103(H) 92 100(H)  BUN 6 - 20 mg/dL 8 12 9   Creatinine 0.44 - 1.00 mg/dL 0.98 0.85 0.96  Sodium 135 - 145 mmol/L 140 140 141  Potassium 3.5 - 5.1 mmol/L 4.0 3.5 3.6  Chloride 98 - 111 mmol/L 103 105 105  CO2 22 - 32 mmol/L 28 27 29   Calcium 8.9 - 10.3 mg/dL 9.6 8.9 9.5  Total Protein 6.5 - 8.1 g/dL 7.6 7.5 7.3  Total Bilirubin 0.3 - 1.2 mg/dL 0.3 0.6 0.6  Alkaline Phos 38 - 126 U/L 106 71 77  AST 15 - 41 U/L 20 16 12(L)  ALT 0 - 44 U/L 15 11 9       RADIOGRAPHIC STUDIES: I have personally reviewed the radiological images as listed and agreed with the findings in the report. No results found.   ASSESSMENT & PLAN:  Brandi Dickson is a 57 y.o. female with    1. Breast cancer of upper-outer quadrant of left breast, invasive ductal carcinoma,  stage IIA (cT2N0M0) grade 3, ER-, PR-, HER2 amplified, ypT2N1a, ER30% weakly+, PR-, HER2+ -She was diagnosed in 03/2016. She is s/p neo-adjuvant THCP, maintenance Herceptin, Left breast lumpectomy, Adjuvant chemoradiation and adjuvant Xeloda. She is currently on Anastrozole for antiestrogen therapy since 06/2017. She is tolerating anastrozole with manageable hot flashes, no joint pain.  -She does has manageable shooting breast pain related to her previous surgery. She is not needing medication for this.  -She is clinically doing well. Lab reviewed, her CBC and CMP WNL. Her physical exam and her 03/2017 mammogram were unremarkable. There is no clinical concern for recurrence. -Next mammogram in 03/2018 -Continue Anastrozole.  -f/u in 4 months   2. Genetics -She understands her family history is concerning for Lynch Syndrome and still declined genetic testing. She knows to continue cancer screenings.  3. Type 2 Diabetes mellitus, HTN -Managed by her PCP. -DM and HTN, controlled.   4. Ulcerative colitis and chronic diarrhea  -She is not taking steroids for this. She is on mesalamine. Continue to follow up with Dr. Fuller Plan  -I recommend a repeat coloscopy in 2020.  -Her diarrhea is controlled with imodium, stable.  5. Arthritis -I previously encouraged her to f/u with PCP for arthritic pain management. Plans to see him about her right knee -Will monitor while on Anastrozole.   6. Peripheral Neuropathy, secondary to DM and worsened with  chemotherapy  -On Gabapentin 200 mg at night, may increase to 300-400 mg at bedtime as needed. Continue Vitamin B12. -Stable, mainly in feet.   7. Hypokalemia  -secondary to chemotherapy  -Continue potassium BID; will increase if she experiences diarrhea -K at 4.0 today  8. Bone Health  -Her DEXA on 07/20/17 was WNLs -Will monitor every 2 years.    PLAN -She  is clinically doing well -Continue anastrozole -Lab and f/u in 4 months    No problem-specific Assessment & Plan notes found for this encounter.   Orders Placed This Encounter  Procedures  . MM DIAG BREAST TOMO BILATERAL    Standing Status:   Future    Standing Expiration Date:   01/25/2019    Scheduling Instructions:     [facility]    Order Specific Question:   Reason for Exam (SYMPTOM  OR DIAGNOSIS REQUIRED)    Answer:   screening    Order Specific Question:   Is the patient pregnant?    Answer:   No    Order Specific Question:   Preferred imaging location?    Answer:   External   All questions were answered. The patient knows to call the clinic with any problems, questions or concerns. No barriers to learning was detected. I spent 20 minutes counseling the patient face to face. The total time spent in the appointment was 25 minutes and more than 50% was on counseling and review of test results     Brandi Merle, MD 01/24/2018   I, Joslyn Devon, am acting as scribe for Brandi Merle, MD.   I have reviewed the above documentation for accuracy and completeness, and I agree with the above.

## 2018-01-24 ENCOUNTER — Inpatient Hospital Stay: Payer: 59

## 2018-01-24 ENCOUNTER — Inpatient Hospital Stay (HOSPITAL_BASED_OUTPATIENT_CLINIC_OR_DEPARTMENT_OTHER): Payer: 59 | Admitting: Hematology

## 2018-01-24 ENCOUNTER — Encounter: Payer: Self-pay | Admitting: Hematology

## 2018-01-24 VITALS — BP 130/74 | HR 73 | Temp 97.3°F | Resp 18 | Ht 64.0 in | Wt 173.4 lb

## 2018-01-24 DIAGNOSIS — H538 Other visual disturbances: Secondary | ICD-10-CM | POA: Diagnosis not present

## 2018-01-24 DIAGNOSIS — R42 Dizziness and giddiness: Secondary | ICD-10-CM | POA: Diagnosis not present

## 2018-01-24 DIAGNOSIS — R4020 Unspecified coma: Secondary | ICD-10-CM | POA: Diagnosis not present

## 2018-01-24 DIAGNOSIS — C7931 Secondary malignant neoplasm of brain: Secondary | ICD-10-CM | POA: Diagnosis not present

## 2018-01-24 DIAGNOSIS — Z171 Estrogen receptor negative status [ER-]: Secondary | ICD-10-CM

## 2018-01-24 DIAGNOSIS — H510 Palsy (spasm) of conjugate gaze: Secondary | ICD-10-CM | POA: Diagnosis not present

## 2018-01-24 DIAGNOSIS — I10 Essential (primary) hypertension: Secondary | ICD-10-CM

## 2018-01-24 DIAGNOSIS — C50412 Malignant neoplasm of upper-outer quadrant of left female breast: Secondary | ICD-10-CM

## 2018-01-24 DIAGNOSIS — K51918 Ulcerative colitis, unspecified with other complication: Secondary | ICD-10-CM

## 2018-01-24 DIAGNOSIS — E1140 Type 2 diabetes mellitus with diabetic neuropathy, unspecified: Secondary | ICD-10-CM

## 2018-01-24 DIAGNOSIS — E876 Hypokalemia: Secondary | ICD-10-CM

## 2018-01-24 LAB — CBC WITH DIFFERENTIAL/PLATELET
Abs Immature Granulocytes: 0.02 10*3/uL (ref 0.00–0.07)
BASOS ABS: 0 10*3/uL (ref 0.0–0.1)
BASOS PCT: 0 %
EOS ABS: 0.2 10*3/uL (ref 0.0–0.5)
Eosinophils Relative: 3 %
HEMATOCRIT: 42 % (ref 36.0–46.0)
Hemoglobin: 12.9 g/dL (ref 12.0–15.0)
IMMATURE GRANULOCYTES: 0 %
Lymphocytes Relative: 23 %
Lymphs Abs: 1.7 10*3/uL (ref 0.7–4.0)
MCH: 26.5 pg (ref 26.0–34.0)
MCHC: 30.7 g/dL (ref 30.0–36.0)
MCV: 86.2 fL (ref 80.0–100.0)
Monocytes Absolute: 0.6 10*3/uL (ref 0.1–1.0)
Monocytes Relative: 8 %
NEUTROS PCT: 66 %
NRBC: 0 % (ref 0.0–0.2)
Neutro Abs: 4.9 10*3/uL (ref 1.7–7.7)
PLATELETS: 305 10*3/uL (ref 150–400)
RBC: 4.87 MIL/uL (ref 3.87–5.11)
RDW: 13.5 % (ref 11.5–15.5)
WBC: 7.4 10*3/uL (ref 4.0–10.5)

## 2018-01-24 LAB — COMPREHENSIVE METABOLIC PANEL
ALBUMIN: 3.2 g/dL — AB (ref 3.5–5.0)
ALT: 15 U/L (ref 0–44)
AST: 20 U/L (ref 15–41)
Alkaline Phosphatase: 106 U/L (ref 38–126)
Anion gap: 9 (ref 5–15)
BUN: 8 mg/dL (ref 6–20)
CHLORIDE: 103 mmol/L (ref 98–111)
CO2: 28 mmol/L (ref 22–32)
Calcium: 9.6 mg/dL (ref 8.9–10.3)
Creatinine, Ser: 0.98 mg/dL (ref 0.44–1.00)
GFR calc Af Amer: >60 mL/min (ref >60)
GFR calc non Af Amer: >60 mL/min (ref >60)
GLUCOSE: 103 mg/dL — AB (ref 70–99)
POTASSIUM: 4 mmol/L (ref 3.5–5.1)
Sodium: 140 mmol/L (ref 135–145)
Total Bilirubin: 0.3 mg/dL (ref 0.3–1.2)
Total Protein: 7.6 g/dL (ref 6.5–8.1)

## 2018-01-25 ENCOUNTER — Encounter (HOSPITAL_COMMUNITY): Payer: Self-pay | Admitting: Nurse Practitioner

## 2018-01-25 DIAGNOSIS — D6869 Other thrombophilia: Secondary | ICD-10-CM | POA: Diagnosis present

## 2018-01-25 DIAGNOSIS — Z853 Personal history of malignant neoplasm of breast: Secondary | ICD-10-CM

## 2018-01-25 DIAGNOSIS — H4922 Sixth [abducent] nerve palsy, left eye: Secondary | ICD-10-CM | POA: Diagnosis present

## 2018-01-25 DIAGNOSIS — I10 Essential (primary) hypertension: Secondary | ICD-10-CM | POA: Diagnosis present

## 2018-01-25 DIAGNOSIS — E785 Hyperlipidemia, unspecified: Secondary | ICD-10-CM | POA: Diagnosis present

## 2018-01-25 DIAGNOSIS — J90 Pleural effusion, not elsewhere classified: Secondary | ICD-10-CM | POA: Diagnosis present

## 2018-01-25 DIAGNOSIS — H510 Palsy (spasm) of conjugate gaze: Secondary | ICD-10-CM | POA: Diagnosis present

## 2018-01-25 DIAGNOSIS — R42 Dizziness and giddiness: Secondary | ICD-10-CM | POA: Diagnosis not present

## 2018-01-25 DIAGNOSIS — F419 Anxiety disorder, unspecified: Secondary | ICD-10-CM | POA: Diagnosis present

## 2018-01-25 DIAGNOSIS — Z823 Family history of stroke: Secondary | ICD-10-CM

## 2018-01-25 DIAGNOSIS — J45909 Unspecified asthma, uncomplicated: Secondary | ICD-10-CM | POA: Diagnosis present

## 2018-01-25 DIAGNOSIS — Z96643 Presence of artificial hip joint, bilateral: Secondary | ICD-10-CM | POA: Diagnosis present

## 2018-01-25 DIAGNOSIS — E1142 Type 2 diabetes mellitus with diabetic polyneuropathy: Secondary | ICD-10-CM | POA: Diagnosis present

## 2018-01-25 DIAGNOSIS — Z7984 Long term (current) use of oral hypoglycemic drugs: Secondary | ICD-10-CM

## 2018-01-25 DIAGNOSIS — Z923 Personal history of irradiation: Secondary | ICD-10-CM

## 2018-01-25 DIAGNOSIS — C773 Secondary and unspecified malignant neoplasm of axilla and upper limb lymph nodes: Secondary | ICD-10-CM | POA: Diagnosis present

## 2018-01-25 DIAGNOSIS — Z803 Family history of malignant neoplasm of breast: Secondary | ICD-10-CM

## 2018-01-25 DIAGNOSIS — Z808 Family history of malignant neoplasm of other organs or systems: Secondary | ICD-10-CM

## 2018-01-25 DIAGNOSIS — Z800 Family history of malignant neoplasm of digestive organs: Secondary | ICD-10-CM

## 2018-01-25 DIAGNOSIS — C787 Secondary malignant neoplasm of liver and intrahepatic bile duct: Secondary | ICD-10-CM | POA: Diagnosis present

## 2018-01-25 DIAGNOSIS — Z90710 Acquired absence of both cervix and uterus: Secondary | ICD-10-CM

## 2018-01-25 DIAGNOSIS — K5190 Ulcerative colitis, unspecified, without complications: Secondary | ICD-10-CM | POA: Diagnosis present

## 2018-01-25 DIAGNOSIS — I63413 Cerebral infarction due to embolism of bilateral middle cerebral arteries: Secondary | ICD-10-CM | POA: Diagnosis present

## 2018-01-25 DIAGNOSIS — Z79811 Long term (current) use of aromatase inhibitors: Secondary | ICD-10-CM

## 2018-01-25 DIAGNOSIS — Z170 Estrogen receptor positive status [ER+]: Secondary | ICD-10-CM

## 2018-01-25 DIAGNOSIS — Z87891 Personal history of nicotine dependence: Secondary | ICD-10-CM

## 2018-01-25 DIAGNOSIS — Z7982 Long term (current) use of aspirin: Secondary | ICD-10-CM

## 2018-01-25 DIAGNOSIS — C7931 Secondary malignant neoplasm of brain: Principal | ICD-10-CM | POA: Diagnosis present

## 2018-01-25 DIAGNOSIS — Z79899 Other long term (current) drug therapy: Secondary | ICD-10-CM

## 2018-01-25 DIAGNOSIS — Z9221 Personal history of antineoplastic chemotherapy: Secondary | ICD-10-CM

## 2018-01-25 DIAGNOSIS — R29705 NIHSS score 5: Secondary | ICD-10-CM | POA: Diagnosis present

## 2018-01-25 NOTE — ED Triage Notes (Signed)
Pt reports a sudden onset blurred vision and associated difficulty in walking. Quick eye inspection reveals involuntary slight deviation of the iris and pupil to the right. She is concerned about a stroke as she states "runs in the family."

## 2018-01-26 ENCOUNTER — Emergency Department (HOSPITAL_COMMUNITY): Payer: 59

## 2018-01-26 ENCOUNTER — Other Ambulatory Visit: Payer: Self-pay

## 2018-01-26 ENCOUNTER — Observation Stay (HOSPITAL_COMMUNITY): Payer: 59

## 2018-01-26 ENCOUNTER — Inpatient Hospital Stay (HOSPITAL_COMMUNITY)
Admission: EM | Admit: 2018-01-26 | Discharge: 2018-01-31 | DRG: 040 | Disposition: A | Discharge status: Home / Self Care | Payer: 59 | Attending: Internal Medicine | Admitting: Internal Medicine

## 2018-01-26 DIAGNOSIS — C799 Secondary malignant neoplasm of unspecified site: Secondary | ICD-10-CM

## 2018-01-26 DIAGNOSIS — C773 Secondary and unspecified malignant neoplasm of axilla and upper limb lymph nodes: Secondary | ICD-10-CM | POA: Diagnosis not present

## 2018-01-26 DIAGNOSIS — I10 Essential (primary) hypertension: Secondary | ICD-10-CM | POA: Diagnosis present

## 2018-01-26 DIAGNOSIS — Z96643 Presence of artificial hip joint, bilateral: Secondary | ICD-10-CM | POA: Diagnosis not present

## 2018-01-26 DIAGNOSIS — J90 Pleural effusion, not elsewhere classified: Secondary | ICD-10-CM | POA: Diagnosis not present

## 2018-01-26 DIAGNOSIS — Z7984 Long term (current) use of oral hypoglycemic drugs: Secondary | ICD-10-CM | POA: Diagnosis not present

## 2018-01-26 DIAGNOSIS — C7931 Secondary malignant neoplasm of brain: Secondary | ICD-10-CM | POA: Diagnosis present

## 2018-01-26 DIAGNOSIS — H538 Other visual disturbances: Secondary | ICD-10-CM | POA: Diagnosis not present

## 2018-01-26 DIAGNOSIS — Z7982 Long term (current) use of aspirin: Secondary | ICD-10-CM | POA: Diagnosis not present

## 2018-01-26 DIAGNOSIS — I6340 Cerebral infarction due to embolism of unspecified cerebral artery: Secondary | ICD-10-CM

## 2018-01-26 DIAGNOSIS — Z79899 Other long term (current) drug therapy: Secondary | ICD-10-CM | POA: Diagnosis not present

## 2018-01-26 DIAGNOSIS — Z79811 Long term (current) use of aromatase inhibitors: Secondary | ICD-10-CM | POA: Diagnosis not present

## 2018-01-26 DIAGNOSIS — I639 Cerebral infarction, unspecified: Secondary | ICD-10-CM | POA: Diagnosis not present

## 2018-01-26 DIAGNOSIS — C801 Malignant (primary) neoplasm, unspecified: Secondary | ICD-10-CM

## 2018-01-26 DIAGNOSIS — K5190 Ulcerative colitis, unspecified, without complications: Secondary | ICD-10-CM | POA: Diagnosis not present

## 2018-01-26 DIAGNOSIS — I63413 Cerebral infarction due to embolism of bilateral middle cerebral arteries: Secondary | ICD-10-CM | POA: Diagnosis not present

## 2018-01-26 DIAGNOSIS — C50919 Malignant neoplasm of unspecified site of unspecified female breast: Secondary | ICD-10-CM

## 2018-01-26 DIAGNOSIS — K51919 Ulcerative colitis, unspecified with unspecified complications: Secondary | ICD-10-CM

## 2018-01-26 DIAGNOSIS — Z87891 Personal history of nicotine dependence: Secondary | ICD-10-CM | POA: Diagnosis not present

## 2018-01-26 DIAGNOSIS — Z923 Personal history of irradiation: Secondary | ICD-10-CM | POA: Diagnosis not present

## 2018-01-26 DIAGNOSIS — R4020 Unspecified coma: Secondary | ICD-10-CM | POA: Diagnosis not present

## 2018-01-26 DIAGNOSIS — Z808 Family history of malignant neoplasm of other organs or systems: Secondary | ICD-10-CM | POA: Diagnosis not present

## 2018-01-26 DIAGNOSIS — H510 Palsy (spasm) of conjugate gaze: Secondary | ICD-10-CM | POA: Diagnosis present

## 2018-01-26 DIAGNOSIS — G629 Polyneuropathy, unspecified: Secondary | ICD-10-CM

## 2018-01-26 DIAGNOSIS — D6869 Other thrombophilia: Secondary | ICD-10-CM | POA: Diagnosis not present

## 2018-01-26 DIAGNOSIS — J45909 Unspecified asthma, uncomplicated: Secondary | ICD-10-CM | POA: Diagnosis not present

## 2018-01-26 DIAGNOSIS — Z803 Family history of malignant neoplasm of breast: Secondary | ICD-10-CM | POA: Diagnosis not present

## 2018-01-26 DIAGNOSIS — F419 Anxiety disorder, unspecified: Secondary | ICD-10-CM | POA: Diagnosis not present

## 2018-01-26 DIAGNOSIS — Z90710 Acquired absence of both cervix and uterus: Secondary | ICD-10-CM | POA: Diagnosis not present

## 2018-01-26 DIAGNOSIS — Z9221 Personal history of antineoplastic chemotherapy: Secondary | ICD-10-CM | POA: Diagnosis not present

## 2018-01-26 DIAGNOSIS — C787 Secondary malignant neoplasm of liver and intrahepatic bile duct: Secondary | ICD-10-CM | POA: Diagnosis not present

## 2018-01-26 DIAGNOSIS — E119 Type 2 diabetes mellitus without complications: Secondary | ICD-10-CM

## 2018-01-26 DIAGNOSIS — Z853 Personal history of malignant neoplasm of breast: Secondary | ICD-10-CM | POA: Diagnosis not present

## 2018-01-26 DIAGNOSIS — R29818 Other symptoms and signs involving the nervous system: Secondary | ICD-10-CM | POA: Diagnosis not present

## 2018-01-26 DIAGNOSIS — Z7189 Other specified counseling: Secondary | ICD-10-CM

## 2018-01-26 DIAGNOSIS — Z9889 Other specified postprocedural states: Secondary | ICD-10-CM

## 2018-01-26 DIAGNOSIS — Z800 Family history of malignant neoplasm of digestive organs: Secondary | ICD-10-CM | POA: Diagnosis not present

## 2018-01-26 DIAGNOSIS — E1169 Type 2 diabetes mellitus with other specified complication: Secondary | ICD-10-CM

## 2018-01-26 LAB — GLUCOSE, CAPILLARY
Glucose-Capillary: 74 mg/dL (ref 70–99)
Glucose-Capillary: 84 mg/dL (ref 70–99)
Glucose-Capillary: 115 mg/dL — ABNORMAL HIGH (ref 70–99)

## 2018-01-26 LAB — COMPREHENSIVE METABOLIC PANEL
ALK PHOS: 85 U/L (ref 38–126)
ALT: 15 U/L (ref 0–44)
AST: 20 U/L (ref 15–41)
Albumin: 3.3 g/dL — ABNORMAL LOW (ref 3.5–5.0)
Anion gap: 8 (ref 5–15)
BILIRUBIN TOTAL: 0.4 mg/dL (ref 0.3–1.2)
BUN: 17 mg/dL (ref 6–20)
CALCIUM: 9 mg/dL (ref 8.9–10.3)
CO2: 27 mmol/L (ref 22–32)
Chloride: 103 mmol/L (ref 98–111)
Creatinine, Ser: 1.02 mg/dL — ABNORMAL HIGH (ref 0.44–1.00)
GFR calc Af Amer: >60 mL/min (ref >60)
GLUCOSE: 120 mg/dL — AB (ref 70–99)
Potassium: 3.7 mmol/L (ref 3.5–5.1)
Sodium: 138 mmol/L (ref 135–145)
TOTAL PROTEIN: 7.2 g/dL (ref 6.5–8.1)

## 2018-01-26 LAB — DIFFERENTIAL
Abs Immature Granulocytes: 0.02 10*3/uL (ref 0.00–0.07)
Basophils Absolute: 0 10*3/uL (ref 0.0–0.1)
Basophils Relative: 0 %
EOS PCT: 3 %
Eosinophils Absolute: 0.2 10*3/uL (ref 0.0–0.5)
IMMATURE GRANULOCYTES: 0 %
LYMPHS ABS: 1.7 10*3/uL (ref 0.7–4.0)
LYMPHS PCT: 22 %
MONOS PCT: 8 %
Monocytes Absolute: 0.6 10*3/uL (ref 0.1–1.0)
Neutro Abs: 5.2 10*3/uL (ref 1.7–7.7)
Neutrophils Relative %: 67 %

## 2018-01-26 LAB — CBC
HEMATOCRIT: 39.7 % (ref 36.0–46.0)
Hemoglobin: 12.1 g/dL (ref 12.0–15.0)
MCH: 26.1 pg (ref 26.0–34.0)
MCHC: 30.5 g/dL (ref 30.0–36.0)
MCV: 85.7 fL (ref 80.0–100.0)
NRBC: 0 % (ref 0.0–0.2)
Platelets: 296 10*3/uL (ref 150–400)
RBC: 4.63 MIL/uL (ref 3.87–5.11)
RDW: 13.6 % (ref 11.5–15.5)
WBC: 7.8 10*3/uL (ref 4.0–10.5)

## 2018-01-26 LAB — PROTIME-INR
INR: 0.98
Prothrombin Time: 12.8 seconds (ref 11.4–15.2)

## 2018-01-26 LAB — CBG MONITORING, ED: Glucose-Capillary: 116 mg/dL — ABNORMAL HIGH (ref 70–99)

## 2018-01-26 LAB — APTT: aPTT: 32 seconds (ref 24–36)

## 2018-01-26 MED ORDER — LORAZEPAM 0.5 MG PO TABS
0.5000 mg | ORAL_TABLET | Freq: Once | ORAL | Status: AC
  Administered 2018-01-26: 0.5 mg via ORAL
  Filled 2018-01-26: qty 1

## 2018-01-26 MED ORDER — ALBUTEROL SULFATE (2.5 MG/3ML) 0.083% IN NEBU: 2.5000 mg | INHALATION_SOLUTION | Freq: Four times a day (QID) | RESPIRATORY_TRACT | Status: DC | PRN

## 2018-01-26 MED ORDER — ACETAMINOPHEN 160 MG/5ML PO SOLN: 650.0000 mg | ORAL | Status: DC | PRN

## 2018-01-26 MED ORDER — MENTHOL 3 MG MT LOZG
1.0000 | LOZENGE | OROMUCOSAL | Status: DC | PRN
  Administered 2018-01-26 – 2018-01-30 (×2): 3 mg via ORAL
  Filled 2018-01-26 (×2): qty 9

## 2018-01-26 MED ORDER — PROBIOTIC-10 PO CAPS: 2.0000 | ORAL_CAPSULE | Freq: Every day | ORAL | Status: DC

## 2018-01-26 MED ORDER — ASPIRIN EC 325 MG PO TBEC
325.0000 mg | DELAYED_RELEASE_TABLET | Freq: Every day | ORAL | Status: DC
  Administered 2018-01-26 – 2018-01-31 (×6): 325 mg via ORAL
  Filled 2018-01-26 (×6): qty 1

## 2018-01-26 MED ORDER — GABAPENTIN 100 MG PO CAPS
200.0000 mg | ORAL_CAPSULE | Freq: Every day | ORAL | Status: DC
  Administered 2018-01-26 – 2018-01-30 (×5): 200 mg via ORAL
  Filled 2018-01-26 (×5): qty 2

## 2018-01-26 MED ORDER — INSULIN ASPART 100 UNIT/ML ~~LOC~~ SOLN
0.0000 [IU] | Freq: Three times a day (TID) | SUBCUTANEOUS | Status: DC
  Administered 2018-01-29: 1 [IU] via SUBCUTANEOUS

## 2018-01-26 MED ORDER — MESALAMINE 400 MG PO CPDR
800.0000 mg | DELAYED_RELEASE_CAPSULE | Freq: Two times a day (BID) | ORAL | Status: DC
  Administered 2018-01-26 – 2018-01-31 (×10): 800 mg via ORAL
  Filled 2018-01-26 (×14): qty 2

## 2018-01-26 MED ORDER — LOPERAMIDE HCL 2 MG PO CAPS
4.0000 mg | ORAL_CAPSULE | ORAL | Status: DC | PRN
  Administered 2018-01-26 – 2018-01-28 (×2): 4 mg via ORAL
  Filled 2018-01-26 (×2): qty 2

## 2018-01-26 MED ORDER — ACETAMINOPHEN 325 MG PO TABS
650.0000 mg | ORAL_TABLET | ORAL | Status: DC | PRN
  Administered 2018-01-27 – 2018-01-31 (×5): 650 mg via ORAL
  Filled 2018-01-26 (×5): qty 2

## 2018-01-26 MED ORDER — ACETAMINOPHEN 650 MG RE SUPP: 650.0000 mg | RECTAL | Status: DC | PRN

## 2018-01-26 MED ORDER — RISAQUAD PO CAPS
1.0000 | ORAL_CAPSULE | Freq: Every day | ORAL | Status: DC
  Administered 2018-01-26 – 2018-01-31 (×6): 1 via ORAL
  Filled 2018-01-26 (×6): qty 1

## 2018-01-26 MED ORDER — ANASTROZOLE 1 MG PO TABS
1.0000 mg | ORAL_TABLET | Freq: Every day | ORAL | Status: DC
  Administered 2018-01-26 – 2018-01-30 (×5): 1 mg via ORAL
  Filled 2018-01-26 (×6): qty 1

## 2018-01-26 MED ORDER — DICYCLOMINE HCL 20 MG PO TABS: 20.0000 mg | ORAL_TABLET | Freq: Three times a day (TID) | ORAL | Status: DC | PRN

## 2018-01-26 MED ORDER — ENOXAPARIN SODIUM 40 MG/0.4ML ~~LOC~~ SOLN
40.0000 mg | SUBCUTANEOUS | Status: DC
  Administered 2018-01-27 – 2018-01-30 (×4): 40 mg via SUBCUTANEOUS
  Filled 2018-01-26 (×5): qty 0.4

## 2018-01-26 MED ORDER — PHENOL 1.4 % MT LIQD
1.0000 | OROMUCOSAL | Status: DC | PRN
  Filled 2018-01-26: qty 177

## 2018-01-26 MED ORDER — STROKE: EARLY STAGES OF RECOVERY BOOK
Freq: Once | Status: AC
  Administered 2018-01-26: 10:00:00
  Filled 2018-01-26 (×2): qty 1

## 2018-01-26 NOTE — Progress Notes (Signed)
PROGRESS NOTE    Brandi Dickson  TLX:[id number] DOB: Mar 04, 1960 DOA: 01/26/2018 PCP: Orpah Melter, MD   Brief Narrative:  HPI On 01/26/2018 by Dr. Shela Leff Brandi Dickson is a 57 y.o. female with medical history significant of stage IIa breast cancer on anastrozole, peripheral neuropathy, type 2 diabetes, hypertension, ulcerative colitis presenting to the hospital for evaluation of blurred vision and difficulty walking.  Patient states at 8:30 PM on 12/28 she noticed that her eyes shifted to the right and she could not look to her left.  Her vision turns blurry every time she tries to move her eyes toward the midline.  Has a family history of stroke but no personal history.  States she takes a baby aspirin daily for primary prevention.  States she does not want to take a statin as her mother had experienced side effects from this drug.  Patient is requesting low-dose Ativan for anxiety prior to MRI.  Assessment & Plan   Gaze deviation to the right, Acute CVA -On examination, patient is unable to cross midline to the left bilaterally -Possibly CVA versus demyelinating disease -CT head: No acute intercranial pathology seen on CT -Neurology consulted and appreciated, recommending MRI brain -MRI brain showed small scattered acute infarcts in the bilateral cerebellum, right cerebellum, left posterior pons.  Pontine infarct is near the facial colliculus and correlates with the visual complaints.  Pattern suggests embolic disease -Continue neuro checks -Pending further neurology commendations -Pending PT, OT, hemoglobin A1c, lipid panel  Stage IIa breast cancer -Currently on anastrozole and followed by oncology, [name]  Ulcerative colitis and chronic diarrhea -Continue mesalamine, Imodium, Bentyl  Diabetes mellitus, type II -Hemoglobin A1c pending -Continue insulin sliding scale with CBG monitoring  Essential hypertension -Given that CVA is suspected, allowing for  permissive hypertension  Asthma -stable, no wheezing on examination -Continue albuterol as needed  Neuropathy -Continue gabapentin  DVT Prophylaxis  lovenox  Code Status: Full  Family Communication: None at bedside  Disposition Plan: Currently in observation.  Patient requires an inpatient admission in my opinion as she will continue to need hospitalization giving the nature of her acute CVA.  Pending further CVA work-up.  Consultants neurology  Procedures  None  Antibiotics   Anti-infectives (From admission, onward)   None      Subjective:   Brandi Dickson seen and examined today.  Unable to move her eyes to the left.  Otherwise she has no complaints of chest pain, shortness breath, abdominal pain, nausea vomiting, diarrhea constipation, dizziness or headache.  Objective:   Vitals:   01/26/18 0659 01/26/18 0700 01/26/18 0734 01/26/18 0941  BP: (!) 138/91 135/88 137/79 (!) 133/96  Pulse: 64 69 73 70  Resp: 15 (!) 21 17 19   Temp:    98.3 F (36.8 C)  TempSrc:    Oral  SpO2: 97% 97% 98% 100%   No intake or output data in the 24 hours ending 01/26/18 1334 There were no vitals filed for this visit.  Exam  General: Well developed, well nourished, NAD, appears stated age  48: NCAT, mucous membranes moist.   Neck: Supple  Cardiovascular: S1 S2 auscultated, no rubs, murmurs or gallops. Regular rate and rhythm.  Respiratory: Clear to auscultation bilaterally with equal chest rise  Abdomen: Soft, nontender, nondistended, + bowel sounds  Extremities: warm dry without cyanosis clubbing or edema  Neuro: AAOx3, unable to gaze to the left bilaterally, otherwise nonfocal  Skin: Without rashes exudates or nodules  Psych: Normal affect  and demeanor with intact judgement and insight   Data Reviewed: I have personally reviewed following labs and imaging studies  CBC: Recent Labs  Lab 01/24/18 0753 01/26/18 0051  WBC 7.4 7.8  NEUTROABS 4.9 5.2  HGB 12.9  12.1  HCT 42.0 39.7  MCV 86.2 85.7  PLT 305 465   Basic Metabolic Panel: Recent Labs  Lab 01/24/18 0753 01/26/18 0051  NA 140 138  K 4.0 3.7  CL 103 103  CO2 28 27  GLUCOSE 103* 120*  BUN 8 17  CREATININE 0.98 1.02*  CALCIUM 9.6 9.0   GFR: Estimated Creatinine Clearance: 60.3 mL/min (A) (by C-G formula based on SCr of 1.02 mg/dL (H)). Liver Function Tests: Recent Labs  Lab 01/24/18 0753 01/26/18 0051  AST 20 20  ALT 15 15  ALKPHOS 106 85  BILITOT 0.3 0.4  PROT 7.6 7.2  ALBUMIN 3.2* 3.3*   No results for input(s): LIPASE, AMYLASE in the last 168 hours. No results for input(s): AMMONIA in the last 168 hours. Coagulation Profile: Recent Labs  Lab 01/26/18 0051  INR 0.98   Cardiac Enzymes: No results for input(s): CKTOTAL, CKMB, CKMBINDEX, TROPONINI in the last 168 hours. BNP (last 3 results) No results for input(s): PROBNP in the last 8760 hours. HbA1C: No results for input(s): HGBA1C in the last 72 hours. CBG: Recent Labs  Lab 01/26/18 0044 01/26/18 1326  GLUCAP 116* 74   Lipid Profile: No results for input(s): CHOL, HDL, LDLCALC, TRIG, CHOLHDL, LDLDIRECT in the last 72 hours. Thyroid Function Tests: No results for input(s): TSH, T4TOTAL, FREET4, T3FREE, THYROIDAB in the last 72 hours. Anemia Panel: No results for input(s): VITAMINB12, FOLATE, FERRITIN, TIBC, IRON, RETICCTPCT in the last 72 hours. Urine analysis:    Component Value Date/Time   COLORURINE YELLOW 11/28/2017 2059   APPEARANCEUR CLEAR 11/28/2017 2059   LABSPEC 1.011 11/28/2017 2059   PHURINE 6.0 11/28/2017 2059   GLUCOSEU NEGATIVE 11/28/2017 2059   HGBUR NEGATIVE 11/28/2017 2059   BILIRUBINUR NEGATIVE 11/28/2017 2059   [place] NEGATIVE 11/28/2017 2059   PROTEINUR NEGATIVE 11/28/2017 2059   UROBILINOGEN 0.2 12/30/2008 1317   NITRITE NEGATIVE 11/28/2017 2059   LEUKOCYTESUR TRACE (A) 11/28/2017 2059   Sepsis Labs: @LABRCNTIP (procalcitonin:4,lacticidven:4)  )No results found for  this or any previous visit (from the past 240 hour(s)).    Radiology Studies: Ct Head Wo Contrast  Result Date: 01/26/2018 CLINICAL DATA:  Acute onset of altered level of consciousness. Dizziness and double vision. Eyes drifting to the right. EXAM: CT HEAD WITHOUT CONTRAST TECHNIQUE: Contiguous axial images were obtained from the base of the skull through the vertex without intravenous contrast. COMPARISON:  MRI of the brain performed 11/28/2017 FINDINGS: Brain: No evidence of acute infarction, hemorrhage, hydrocephalus, extra-axial collection or mass lesion/mass effect. The posterior fossa, including the cerebellum, brainstem and fourth ventricle, is within normal limits. The third and lateral ventricles, and basal ganglia are unremarkable in appearance. The cerebral hemispheres are symmetric in appearance, with normal gray-white differentiation. No mass effect or midline shift is seen. Vascular: No hyperdense vessel or unexpected calcification. Skull: There is no evidence of fracture; visualized osseous structures are unremarkable in appearance. Sinuses/Orbits: There is deviation of gaze to the right. The orbits are otherwise unremarkable. The paranasal sinuses and mastoid air cells are well-aerated. Other: No significant soft tissue abnormalities are seen. IMPRESSION: 1. No acute intracranial pathology seen on CT. 2. Deviation of gaze to the right, as clinically described. Electronically Signed   By: Garald Balding  M.D.   On: 01/26/2018 01:56   Mr Brain Wo Contrast  Result Date: 01/26/2018 CLINICAL DATA:  Blurred vision and difficulty walking EXAM: MRI HEAD WITHOUT CONTRAST TECHNIQUE: Multiplanar, multiecho pulse sequences of the brain and surrounding structures were obtained without intravenous contrast. COMPARISON:  Head CT from earlier today FINDINGS: Brain: Subcentimeter foci of restricted diffusion along the left para median floor of the fourth ventricle (near the facial colliculus), right  cerebellum, bilateral occipital cortex, and bilateral posterior frontal cortex, and right centrum semiovale. No prior ischemic injury is noted. No hemorrhage, hydrocephalus, or masslike finding Vascular: Major flow voids are preserved Skull and upper cervical spine: Negative for marrow lesion Sinuses/Orbits: Negative IMPRESSION: Small scattered acute infarcts in the bilateral cerebrum, right cerebellum, and left posterior pons. The pontine infarct is near the facial colliculus and correlates with the visual complaints. The pattern suggests central embolic disease. Electronically Signed   By: Monte Fantasia M.D.   On: 01/26/2018 12:20     Scheduled Meds: . acidophilus  1 capsule Oral Daily  . anastrozole  1 mg Oral Daily  . aspirin EC  325 mg Oral Daily  . enoxaparin (LOVENOX) injection  40 mg Subcutaneous Q24H  . gabapentin  200 mg Oral QHS  . insulin aspart  0-9 Units Subcutaneous TID WC  . Mesalamine  800 mg Oral BID   Continuous Infusions:   LOS: 0 days   Time Spent in minutes   30 minutes  Maanasa Aderhold D.O. on 01/26/2018 at 1:34 PM  Between 7am to 7pm - Please see pager noted on [email or web address]  After 7pm go to [email or web address]  And look for the night coverage person covering for me after hours  [facility] Office  [phone]

## 2018-01-26 NOTE — H&P (Signed)
History and Physical    Brandi Dickson QIO:[id number] DOB: 1961/01/12 DOA: 01/26/2018  PCP: Orpah Melter, MD Patient coming from: Home  Chief Complaint: Blurred vision, difficulty walking  HPI: Brandi Dickson is a 57 y.o. female with medical history significant of stage IIa breast cancer on anastrozole, peripheral neuropathy, type 2 diabetes, hypertension, ulcerative colitis presenting to the hospital for evaluation of blurred vision and difficulty walking.  Patient states at 8:30 PM on 12/28 she noticed that her eyes shifted to the right and she could not look to her left.  Her vision turns blurry every time she tries to move her eyes toward the midline.  Has a family history of stroke but no personal history.  States she takes a baby aspirin daily for primary prevention.  States she does not want to take a statin as her mother had experienced side effects from this drug.  Patient is requesting low-dose Ativan for anxiety prior to MRI.  ED Course: Afebrile and hemodynamically stable.  No leukocytosis.  CT head showing deviation of gaze to the right; no acute intracranial pathology.  Review of Systems: As per HPI otherwise 10 point review of systems negative.  Past Medical History:  Diagnosis Date  . Arthritis   . Asthma    triggered with [name] perfumes and cigarette smoke  . Cancer ([place])   . Colitis   . Diabetes mellitus without complication ([place])   . History of radiation therapy 11/22/16-01/10/17   left breast 50.4 Gy in 28 fractions, axillary region 45 Gy in 25 fractions, lumpectomy cavity boost 10 Gy tin 5 fractions  . Hypertension   . Neuropathy     Past Surgical History:  Procedure Laterality Date  . ABDOMINAL HYSTERECTOMY    . BREAST LUMPECTOMY WITH RADIOACTIVE SEED AND SENTINEL LYMPH NODE BIOPSY Left 10/09/2016   Procedure: LEFT BREAST LUMPECTOMY WITH RADIOACTIVE SEED AND L4EFT AXILLARY SENTINEL LYMPH NODE BIOPSY;  Surgeon: [name], MD;  Location: [facility];  Service: General;  Laterality: Left;  . DILATION AND CURETTAGE OF UTERUS    . KNEE ARTHROSCOPY Left   . PORTACATH PLACEMENT Right 05/08/2016   Procedure: INSERTION PORT-A-CATH WITH [place];  Surgeon: [name], MD;  Location: [facility];  Service: General;  Laterality: Right;  . TONSILLECTOMY    . TOTAL HIP ARTHROPLASTY Right   . TOTAL HIP ARTHROPLASTY Left 03/06/2016   Procedure: LEFT TOTAL HIP ARTHROPLASTY ANTERIOR APPROACH;  Surgeon: [name], MD;  Location: [facility];  Service: Orthopedics;  Laterality: Left;     reports that she quit smoking about 15 years ago. Her smoking use included cigarettes. She has a 29.00 pack-year smoking history. She has never used smokeless tobacco. She reports that she does not drink alcohol or use drugs.  Allergies  Allergen Reactions  . Gadavist [Gadobutrol] Nausea And Vomiting    Severe vomiting within 1 min of injection.   . Lisinopril Palpitations  . Augmentin [Amoxicillin-Pot Clavulanate] Other (See Comments)    sts gives her a yeast infection Has patient had a PCN reaction causing immediate rash, facial/tongue/throat swelling, SOB or lightheadedness with hypotension: no Has patient had a PCN reaction causing severe rash involving mucus membranes or skin necrosis: no Has patient had a PCN reaction that required hospitalization no Has patient had a PCN reaction occurring within the last 10 years: unknown If all of the above answers are "NO", then may proceed with Cephalosporin use.   . Benadryl [Diphenhydramine] Itching and Anxiety  Per pt: "Makes my skin crawl"; makes pt sensitive to touch  . Losartan Potassium Palpitations    Family History  Problem Relation Age of Onset  . Colon cancer Father   . Stomach cancer Paternal Uncle   . Stomach cancer Paternal Uncle   . Melanoma Brother   . Thyroid cancer Brother   . Breast cancer Maternal Aunt   . Breast cancer Maternal Aunt   . Breast cancer Cousin      Prior to Admission medications   Medication Sig Start Date End Date Taking? Authorizing Provider  albuterol (PROVENTIL HFA;VENTOLIN HFA) 108 (90 Base) MCG/ACT inhaler Inhale 1-2 puffs into the lungs every 6 (six) hours as needed for wheezing or shortness of breath.   Yes [provider]  anastrozole (ARIMIDEX) 1 MG tablet Take 1 tablet (1 mg total) by mouth daily. 10/08/17  Yes Truitt Merle, MD  aspirin EC 81 MG tablet Take 81 mg by mouth 2 (two) times daily.   Yes [provider]  dicyclomine (BENTYL) 20 MG tablet Take 1 tablet (20 mg total) by mouth 4 (four) times daily -  before meals and at bedtime. Patient taking differently: Take 20 mg by mouth 3 (three) times daily as needed for spasms.  06/05/17  Yes Truitt Merle, MD  gabapentin (NEURONTIN) 100 MG capsule Take 200 mg by mouth at bedtime.    Yes [provider]  Garlic (GARLIQUE) 355 MG TBEC Take 400 mg by mouth daily.   Yes [provider]  Ginkgo Biloba Extract 120 MG CAPS Take 120 mg by mouth daily with breakfast.   Yes [provider]  loperamide (IMODIUM) 2 MG capsule Take 4 mg by mouth as needed for diarrhea or loose stools.   Yes [provider]  Mesalamine 800 MG TBEC 2 TABLETS BY MOUTH THREE TIMES A DAY Patient taking differently: Take 2 tablets by mouth 2 (two) times daily.  07/26/17  Yes Ladene Artist, MD  metFORMIN (GLUCOPHAGE-XR) 500 MG 24 hr tablet Take 1,000 mg by mouth at bedtime.   Yes [provider]  metoprolol (LOPRESSOR) 50 MG tablet Take 50 mg by mouth 2 (two) times daily.    Yes [provider]  potassium chloride SA (KLOR-CON M20) 20 MEQ tablet Take 1 tablet (20 mEq total) by mouth 2 (two) times daily. Patient taking differently: Take 20 mEq by mouth daily with breakfast.  05/10/17  Yes Truitt Merle, MD  Probiotic Product (PROBIOTIC-10) CAPS Take 2 capsules by mouth daily with breakfast.   Yes [provider]  triamterene-hydrochlorothiazide  (MAXZIDE) 75-50 MG per tablet Take 0.5 tablets by mouth daily with breakfast.    Yes [provider]  meclizine (ANTIVERT) 25 MG tablet Take 1 tablet (25 mg total) by mouth 3 (three) times daily as needed for dizziness. Patient not taking: Reported on 01/26/2018 11/28/17   Isla Pence, MD    Physical Exam: Vitals:   01/26/18 0549 01/26/18 0659 01/26/18 0700 01/26/18 0734  BP: 119/74 (!) 138/91 135/88 137/79  Pulse: 69 64 69 73  Resp: 19 15 (!) 21 17  Temp:      TempSrc:      SpO2: 99% 97% 97% 98%    Physical Exam  Constitutional: She is oriented to person, place, and time. She appears well-developed and well-nourished. No distress.  HENT:  Head: Normocephalic.  Mouth/Throat: Oropharynx is clear and moist.  Eyes: Pupils are equal, round, and reactive to light.  Gaze deviation to the right, unable  to look to the left past midline  Neck: Neck supple.  Cardiovascular: Normal rate, regular rhythm and intact distal pulses.  Pulmonary/Chest: Effort normal and breath sounds normal. No respiratory distress. She has no wheezes. She has no rales.  Abdominal: Soft. Bowel sounds are normal. She exhibits no distension. There is no abdominal tenderness. There is no guarding.  Musculoskeletal:        General: No edema.  Neurological: She is alert and oriented to person, place, and time. A cranial nerve deficit is present.  No facial droop Tongue midline No slurring of speech Strength 5 out of 5 in bilateral upper extremities. Sensation to light touch intact throughout.  Skin: Skin is warm and dry. She is not diaphoretic.     Labs on Admission: I have personally reviewed following labs and imaging studies  CBC: Recent Labs  Lab 01/24/18 0753 01/26/18 0051  WBC 7.4 7.8  NEUTROABS 4.9 5.2  HGB 12.9 12.1  HCT 42.0 39.7  MCV 86.2 85.7  PLT 305 322   Basic Metabolic Panel: Recent Labs  Lab 01/24/18 0753 01/26/18 0051  NA 140 138  K 4.0 3.7  CL 103 103  CO2 28 27    GLUCOSE 103* 120*  BUN 8 17  CREATININE 0.98 1.02*  CALCIUM 9.6 9.0   GFR: Estimated Creatinine Clearance: 60.3 mL/min (A) (by C-G formula based on SCr of 1.02 mg/dL (H)). Liver Function Tests: Recent Labs  Lab 01/24/18 0753 01/26/18 0051  AST 20 20  ALT 15 15  ALKPHOS 106 85  BILITOT 0.3 0.4  PROT 7.6 7.2  ALBUMIN 3.2* 3.3*   No results for input(s): LIPASE, AMYLASE in the last 168 hours. No results for input(s): AMMONIA in the last 168 hours. Coagulation Profile: Recent Labs  Lab 01/26/18 0051  INR 0.98   Cardiac Enzymes: No results for input(s): CKTOTAL, CKMB, CKMBINDEX, TROPONINI in the last 168 hours. BNP (last 3 results) No results for input(s): PROBNP in the last 8760 hours. HbA1C: No results for input(s): HGBA1C in the last 72 hours. CBG: Recent Labs  Lab 01/26/18 0044  GLUCAP 116*   Lipid Profile: No results for input(s): CHOL, HDL, LDLCALC, TRIG, CHOLHDL, LDLDIRECT in the last 72 hours. Thyroid Function Tests: No results for input(s): TSH, T4TOTAL, FREET4, T3FREE, THYROIDAB in the last 72 hours. Anemia Panel: No results for input(s): VITAMINB12, FOLATE, FERRITIN, TIBC, IRON, RETICCTPCT in the last 72 hours. Urine analysis:    Component Value Date/Time   COLORURINE YELLOW 11/28/2017 2059   APPEARANCEUR CLEAR 11/28/2017 2059   LABSPEC 1.011 11/28/2017 2059   PHURINE 6.0 11/28/2017 2059   GLUCOSEU NEGATIVE 11/28/2017 2059   HGBUR NEGATIVE 11/28/2017 2059   BILIRUBINUR NEGATIVE 11/28/2017 2059   [place] NEGATIVE 11/28/2017 2059   PROTEINUR NEGATIVE 11/28/2017 2059   UROBILINOGEN 0.2 12/30/2008 1317   NITRITE NEGATIVE 11/28/2017 2059   LEUKOCYTESUR TRACE (A) 11/28/2017 2059    Radiological Exams on Admission: Ct Head Wo Contrast  Result Date: 01/26/2018 CLINICAL DATA:  Acute onset of altered level of consciousness. Dizziness and double vision. Eyes drifting to the right. EXAM: CT HEAD WITHOUT CONTRAST TECHNIQUE: Contiguous axial images were  obtained from the base of the skull through the vertex without intravenous contrast. COMPARISON:  MRI of the brain performed 11/28/2017 FINDINGS: Brain: No evidence of acute infarction, hemorrhage, hydrocephalus, extra-axial collection or mass lesion/mass effect. The posterior fossa, including the cerebellum, brainstem and fourth ventricle, is within normal limits. The third and lateral ventricles, and basal ganglia  are unremarkable in appearance. The cerebral hemispheres are symmetric in appearance, with normal gray-white differentiation. No mass effect or midline shift is seen. Vascular: No hyperdense vessel or unexpected calcification. Skull: There is no evidence of fracture; visualized osseous structures are unremarkable in appearance. Sinuses/Orbits: There is deviation of gaze to the right. The orbits are otherwise unremarkable. The paranasal sinuses and mastoid air cells are well-aerated. Other: No significant soft tissue abnormalities are seen. IMPRESSION: 1. No acute intracranial pathology seen on CT. 2. Deviation of gaze to the right, as clinically described. Electronically Signed   By: Garald Balding M.D.   On: 01/26/2018 01:56    EKG: Independently reviewed.  Sinus rhythm (heart rate 70), low voltage.  Assessment/Plan Principal Problem:   Neurological deficit present Active Problems:   Hypertension   Breast cancer ([place])   Ulcerative colitis ([place])   Type 2 diabetes mellitus ([facility])   Asthma   Neuropathy  Gaze deviation to the right, unable to cross midline Stroke risk factors include history of cancer, diabetes, and hypertension.  CT head showing deviation of gaze to the right; no acute intracranial pathology.  Patient was seen by neurology and her presentation is thought to be secondary to likely left-sided pontine lesion.  Differential diagnosis includes lacunar infarction and demyelinating lesion. -Neurology recommended MRI brain without contrast (gadolinium allergy); pending at this  time -Ativan 0.5 mg once prior to MRI for anxiety -Neurology following; further recommendations pending following completion of MRI.  Will hold off ordering carotid Dopplers and echocardiogram at this time. -Aspirin 325 mg daily -Check A1c, lipid panel -PT/OT/SLP -Allow permissive hypertension up to 220/120  Stage IIa breast cancer Currently on anastrozole.  Followed by [name].  Last oncology visit January 24, 2018.  Ulcerative colitis and chronic diarrhea -Continue home mesalamine, Imodium, Bentyl  Type 2 diabetes -Check A1c -Sliding scale insulin sensitive -CBG checks  Hypertension -Hold home antihypertensives at this time in the setting of suspected CVA.  Allow permissive hypertension up to 220/120.  Asthma -Stable.  No bronchospasm.  Continue albuterol as needed.  Neuropathy -Continue gabapentin  DVT prophylaxis: Lovenox Code Status: Full code.  Discussed with the patient Family Communication: Family at bedside. Disposition Plan: Anticipate discharge in 1 to 2 days. Consults called: Neurology Admission status: Observation, telemetry   Shela Leff MD [facility] Pager [phone]  If 7PM-7AM, please contact night-coverage [email or web address] Password [facility]  01/26/2018, 9:32 AM

## 2018-01-26 NOTE — ED Notes (Signed)
Report given to Boyd for [facility] 3W, 14C.

## 2018-01-26 NOTE — Consult Note (Signed)
NEURO HOSPITALIST CONSULT NOTE   Requestig physician: Dr. Regenia Skeeter  Reason for Consult: New onset of rightward gaze deviation  History obtained from:  Patient and Chart     HPI:                                                                                                                                          Brandi Dickson is an 57 y.o. female with a history of invasive ductal breast cancer s/p radiation therapy, neuropathy, DM and HTN, who presented to the [facility] ED late Saturday night with c/c of sudden onset blurred vision with associated difficulty ambulating. She felt off balance when standing up. Symptoms started at 8:30 PM while she was with friends. EDP exam was notable for rightward eye deviation without nystagmus. She was able to conjugately deviate her eyes to the midline but could not cross the midline to the left. With oculocephalic reflex testing (shaking head sideways while fixating on examiner's nose), her eyes still could not cross the midline to the left. EDP also noted that the patient was listing to the right while ambulating. CT head was unremarkable. There was concern for a possible brainstem stroke or other acute lesion not detectable on CT. Decision made to admit the patient and transfer to [facility] for Neurology evaluation and MRI brain with and without contrast.   Denies headache, fever, neck pain, vomiting, ear symptoms, limb weakness, limb numbness or dizziness.   She was previously seen in October for right sided facial and neck numbness in conjunction with gait instability. Also had a one week history of vertigo at that time, causing her to miss work. MRI brain at that time was normal.   Home medicatons include ASA.   Past Medical History:  Diagnosis Date  . Arthritis   . Asthma    triggered with [name] perfumes and cigarette smoke  . Cancer ([place])   . Colitis   . Diabetes mellitus without complication ([place])   . History of radiation  therapy 11/22/16-01/10/17   left breast 50.4 Gy in 28 fractions, axillary region 45 Gy in 25 fractions, lumpectomy cavity boost 10 Gy tin 5 fractions  . Hypertension   . Neuropathy     Past Surgical History:  Procedure Laterality Date  . ABDOMINAL HYSTERECTOMY    . BREAST LUMPECTOMY WITH RADIOACTIVE SEED AND SENTINEL LYMPH NODE BIOPSY Left 10/09/2016   Procedure: LEFT BREAST LUMPECTOMY WITH RADIOACTIVE SEED AND L4EFT AXILLARY SENTINEL LYMPH NODE BIOPSY;  Surgeon: [name], MD;  Location: [facility];  Service: General;  Laterality: Left;  . DILATION AND CURETTAGE OF UTERUS    . KNEE ARTHROSCOPY Left   . PORTACATH PLACEMENT Right 05/08/2016   Procedure: INSERTION PORT-A-CATH WITH [place];  Surgeon: [name], MD;  Location: [facility];  Service: General;  Laterality: Right;  . TONSILLECTOMY    . TOTAL HIP ARTHROPLASTY Right   . TOTAL HIP ARTHROPLASTY Left 03/06/2016   Procedure: LEFT TOTAL HIP ARTHROPLASTY ANTERIOR APPROACH;  Surgeon: [name], MD;  Location: [facility];  Service: Orthopedics;  Laterality: Left;    Family History  Problem Relation Age of Onset  . Colon cancer Father   . Stomach cancer Paternal Uncle   . Stomach cancer Paternal Uncle   . Melanoma Brother   . Thyroid cancer Brother   . Breast cancer Maternal Aunt   . Breast cancer Maternal Aunt   . Breast cancer Cousin               Social History:  reports that she quit smoking about 15 years ago. Her smoking use included cigarettes. She has a 29.00 pack-year smoking history. She has never used smokeless tobacco. She reports that she does not drink alcohol or use drugs.  Allergies  Allergen Reactions  . Gadavist [Gadobutrol] Nausea And Vomiting    Severe vomiting within 1 min of injection.   . Lisinopril Palpitations  . Augmentin [Amoxicillin-Pot Clavulanate] Other (See Comments)    sts gives her a yeast infection Has patient had a PCN reaction causing immediate rash,  facial/tongue/throat swelling, SOB or lightheadedness with hypotension: no Has patient had a PCN reaction causing severe rash involving mucus membranes or skin necrosis: no Has patient had a PCN reaction that required hospitalization no Has patient had a PCN reaction occurring within the last 10 years: unknown If all of the above answers are "NO", then may proceed with Cephalosporin use.   . Benadryl [Diphenhydramine] Itching and Anxiety    Per pt: "Makes my skin crawl"; makes pt sensitive to touch  . Losartan Potassium Palpitations    HOME MEDICATIONS:                                                                                                                       ROS:                                                                                                                                       As per HPI. Does not endorse additional symptoms.    Blood pressure (!) 150/89, pulse 75, temperature 97.7 F (36.5 C), temperature source Oral, resp. rate 18, SpO2 100 %.  General Examination:                                                                                                       Physical Exam  HEENT-  [place]/AT   Lungs - Respirations unlabored Extremities- No edema  Neurological Examination Mental Status:  Alert, oriented, thought content appropriate.  Speech fluent without evidence of aphasia.  Able to follow all commands without difficulty. Cranial Nerves: II: Visual fields intact bilaterally with all 4 quadrants tested individually. PERRL. No deficit of visual acuity.    III,IV, VI: No ptosis. Rightward conjugate eye deviation. When attempting to volitionally gaze to the left, eyes cannot cross midline, but remain conjugate; there is subtle intermittent nystagmus when patient attempts to deviate eyes to the midline. With oculocephalic reflex, eyes also unable to cross midline to left.  V,VII: Face symmetric. Temp sensation equal bilaterally  VIII: hearing intact  to voice IX,X: Palate rises symmetrically XI: Symmetric shoulder shrug XII: midline tongue extension Motor: Right : Upper extremity   5/5    Left:     Upper extremity   5/5  Lower extremity   5/5     Lower extremity   5/5 Normal tone throughout; no atrophy noted No pronator drift Sensory: Temp and light touch intact throughout, bilaterally. No extinction.  Deep Tendon Reflexes: 1+ and symmetric throughout Plantars: Right: downgoing   Left: downgoing Cerebellar: No ataxia with FNF bilaterally, but there is subtle dysmetria of LUE with more coarse movement (bringing entire left hand to face). No ataxia with H-S bilaterally.   Gait: Deferred   Lab Results: Basic Metabolic Panel: Recent Labs  Lab 01/24/18 0753 01/26/18 0051  NA 140 138  K 4.0 3.7  CL 103 103  CO2 28 27  GLUCOSE 103* 120*  BUN 8 17  CREATININE 0.98 1.02*  CALCIUM 9.6 9.0    CBC: Recent Labs  Lab 01/24/18 0753 01/26/18 0051  WBC 7.4 7.8  NEUTROABS 4.9 5.2  HGB 12.9 12.1  HCT 42.0 39.7  MCV 86.2 85.7  PLT 305 296    Cardiac Enzymes: No results for input(s): CKTOTAL, CKMB, CKMBINDEX, TROPONINI in the last 168 hours.  Lipid Panel: No results for input(s): CHOL, TRIG, HDL, CHOLHDL, VLDL, LDLCALC in the last 168 hours.  Imaging: Ct Head Wo Contrast  Result Date: 01/26/2018 CLINICAL DATA:  Acute onset of altered level of consciousness. Dizziness and double vision. Eyes drifting to the right. EXAM: CT HEAD WITHOUT CONTRAST TECHNIQUE: Contiguous axial images were obtained from the base of the skull through the vertex without intravenous contrast. COMPARISON:  MRI of the brain performed 11/28/2017 FINDINGS: Brain: No evidence of acute infarction, hemorrhage, hydrocephalus, extra-axial collection or mass lesion/mass effect. The posterior fossa, including the cerebellum, brainstem and fourth ventricle, is within normal limits. The third and lateral ventricles, and basal ganglia are unremarkable in appearance. The  cerebral hemispheres are symmetric in appearance, with normal gray-white differentiation. No mass effect or midline shift is seen. Vascular: No hyperdense vessel or unexpected calcification. Skull: There is no evidence of fracture; visualized osseous  structures are unremarkable in appearance. Sinuses/Orbits: There is deviation of gaze to the right. The orbits are otherwise unremarkable. The paranasal sinuses and mastoid air cells are well-aerated. Other: No significant soft tissue abnormalities are seen. IMPRESSION: 1. No acute intracranial pathology seen on CT. 2. Deviation of gaze to the right, as clinically described. Electronically Signed   By: Garald Balding M.D.   On: 01/26/2018 01:56    Assessment: 57 year old female with gaze deviation to the right, unable to cross midline to left volitionally or with oculocephalic maneuver 1. Overall symptoms and signs best localize as a left sided pontine lesion near or overlapping the CN6 nucleus 2. DDx for underlying etiology includes lacunar infarction and demyelinating lesion.  3. Unlikely to be due to metastatic disease due to abruptness of onset 4. Stroke risk factors: Cancer, DM, HTN.  Recommendations: 1. MRI brain. Cannot perform with contrast due to allergy to [facility].  2. Administer 1 mg IV Ativan prior to MRI 3. Further neurological recommendations following completion of MRI  Electronically signed: Dr. Kerney Elbe  01/26/2018, 4:24 AM

## 2018-01-26 NOTE — ED Provider Notes (Signed)
[facility] DEPT Provider Note   CSN: [id number] Arrival date & time: 01/25/18  2223     History   Chief Complaint Chief Complaint  Patient presents with  . Blurred Vision  . Dizziness    HPI Brandi Dickson is a 57 y.o. female.  HPI  57 year old female presents with sudden onset difficulty with vision and eyes being deviated to the right.  Started around 8:30 PM while she was with friends.  At first she thought it would go away but it still has not.  She cannot move her eyes past midline to the left.  There is no associated headache, vomiting, neck pain or stiffness, fevers, or weakness/numbness in the extremities.  She feels off balance when she stands up and has a hard time walking, she leans to the right.  No ear symptoms.  She is had vertigo but this does not feel similar.  There is no significant dizziness.  She states that when she turns her head to the left she will see double vision.  It is hard for her to focus her eyes but she can when looking towards the right and there is no significant blurry vision that way.  Past Medical History:  Diagnosis Date  . Arthritis   . Asthma    triggered with [name] perfumes and cigarette smoke  . Cancer ([place])   . Colitis   . Diabetes mellitus without complication ([place])   . History of radiation therapy 11/22/16-01/10/17   left breast 50.4 Gy in 28 fractions, axillary region 45 Gy in 25 fractions, lumpectomy cavity boost 10 Gy tin 5 fractions  . Hypertension   . Neuropathy     Patient Active Problem List   Diagnosis Date Noted  . Gaze palsy 01/26/2018  . Ulcerative colitis with rectal bleeding ([place]) 05/31/2017  . Ulcerative colitis with complication ([place]) 40/98/1191  . Lactic acidosis 07/19/2016  . Antineoplastic chemotherapy induced pancytopenia ([place])   . GIB (gastrointestinal bleeding) 06/07/2016  . Hypertension   . Diabetes mellitus without complication ([place])   . Ulcerative colitis  without complications ([place]) 47/82/9562  . Hematochezia   . Generalized abdominal pain   . Nausea vomiting and diarrhea   . Diarrhea of presumed infectious origin   . Hyponatremia 05/18/2016  . Hypokalemia 05/18/2016  . Hypomagnesemia 05/18/2016  . Dehydration 05/18/2016  . Blood in stool 05/18/2016  . Thrombocytopenia ([place]) 05/18/2016  . GI bleeding 05/18/2016  . Port catheter in place 05/11/2016  . Breast cancer of upper-outer quadrant of left female breast ([place]) 05/02/2016  . S/P left THA, AA 03/06/2016  . Rectal bleeding 07/14/2015  . LLQ abdominal pain 07/14/2015  . Special screening for malignant neoplasms, colon 07/14/2015  . Loss of weight 07/14/2015  . Family history of colon cancer 07/14/2015    Past Surgical History:  Procedure Laterality Date  . ABDOMINAL HYSTERECTOMY    . BREAST LUMPECTOMY WITH RADIOACTIVE SEED AND SENTINEL LYMPH NODE BIOPSY Left 10/09/2016   Procedure: LEFT BREAST LUMPECTOMY WITH RADIOACTIVE SEED AND L4EFT AXILLARY SENTINEL LYMPH NODE BIOPSY;  Surgeon: [name], MD;  Location: [facility];  Service: General;  Laterality: Left;  . DILATION AND CURETTAGE OF UTERUS    . KNEE ARTHROSCOPY Left   . PORTACATH PLACEMENT Right 05/08/2016   Procedure: INSERTION PORT-A-CATH WITH [place];  Surgeon: [name], MD;  Location: [facility];  Service: General;  Laterality: Right;  . TONSILLECTOMY    . TOTAL HIP ARTHROPLASTY Right   .  TOTAL HIP ARTHROPLASTY Left 03/06/2016   Procedure: LEFT TOTAL HIP ARTHROPLASTY ANTERIOR APPROACH;  Surgeon: [name], MD;  [facility];  Service: Orthopedics;  Laterality: Left;     OB History   No obstetric history on file.      Home Medications    Prior to Admission medications   Medication Sig Start Date End Date Taking? Authorizing Provider  albuterol (PROVENTIL HFA;VENTOLIN HFA) 108 (90 Base) MCG/ACT inhaler Inhale 1-2 puffs into the lungs every 6 (six) hours as needed for wheezing or  shortness of breath.   Yes [provider]  anastrozole (ARIMIDEX) 1 MG tablet Take 1 tablet (1 mg total) by mouth daily. 10/08/17  Yes Truitt Merle, MD  aspirin EC 81 MG tablet Take 81 mg by mouth 2 (two) times daily.   Yes [provider]  dicyclomine (BENTYL) 20 MG tablet Take 1 tablet (20 mg total) by mouth 4 (four) times daily -  before meals and at bedtime. Patient taking differently: Take 20 mg by mouth 3 (three) times daily as needed for spasms.  06/05/17  Yes Truitt Merle, MD  gabapentin (NEURONTIN) 100 MG capsule Take 200 mg by mouth at bedtime.    Yes [provider]  Garlic (GARLIQUE) 702 MG TBEC Take 400 mg by mouth daily.   Yes [provider]  Ginkgo Biloba Extract 120 MG CAPS Take 120 mg by mouth daily with breakfast.   Yes [provider]  loperamide (IMODIUM) 2 MG capsule Take 4 mg by mouth as needed for diarrhea or loose stools.   Yes [provider]  Mesalamine 800 MG TBEC 2 TABLETS BY MOUTH THREE TIMES A DAY Patient taking differently: Take 2 tablets by mouth 2 (two) times daily.  07/26/17  Yes Ladene Artist, MD  metFORMIN (GLUCOPHAGE-XR) 500 MG 24 hr tablet Take 1,000 mg by mouth at bedtime.   Yes [provider]  metoprolol (LOPRESSOR) 50 MG tablet Take 50 mg by mouth 2 (two) times daily.    Yes [provider]  potassium chloride SA (KLOR-CON M20) 20 MEQ tablet Take 1 tablet (20 mEq total) by mouth 2 (two) times daily. Patient taking differently: Take 20 mEq by mouth daily with breakfast.  05/10/17  Yes Truitt Merle, MD  Probiotic Product (PROBIOTIC-10) CAPS Take 2 capsules by mouth daily with breakfast.   Yes [provider]  triamterene-hydrochlorothiazide (MAXZIDE) 75-50 MG per tablet Take 0.5 tablets by mouth daily with breakfast.    Yes [provider]  meclizine (ANTIVERT) 25 MG tablet Take 1 tablet (25 mg total) by mouth 3 (three) times daily as needed for dizziness. Patient not taking:  Reported on 01/26/2018 11/28/17   Isla Pence, MD    Family History Family History  Problem Relation Age of Onset  . Colon cancer Father   . Stomach cancer Paternal Uncle   . Stomach cancer Paternal Uncle   . Melanoma Brother   . Thyroid cancer Brother   . Breast cancer Maternal Aunt   . Breast cancer Maternal Aunt   . Breast cancer Cousin     Social History Social History   Tobacco Use  . Smoking status: Former Smoker    Packs/day: 1.00    Years: 29.00    Pack years: 29.00    Types: Cigarettes    Last attempt to quit: 10/27/2002    Years since quitting: 15.2  . Smokeless tobacco: Never Used  Substance Use Topics  . Alcohol use: No  Alcohol/week: 0.0 standard drinks  . Drug use: No     Allergies   Gadavist [gadobutrol]; Lisinopril; Augmentin [amoxicillin-pot clavulanate]; Benadryl [diphenhydramine]; and Losartan potassium   Review of Systems Review of Systems  Constitutional: Negative for fever.  Eyes: Positive for visual disturbance.  Cardiovascular: Negative for chest pain.  Gastrointestinal: Negative for vomiting.  Musculoskeletal: Negative for neck pain and neck stiffness.  Neurological: Negative for dizziness, weakness, numbness and headaches.  All other systems reviewed and are negative.    Physical Exam Updated Vital Signs BP (!) 138/91 (BP Location: Right Arm)   Pulse 64   Temp 97.7 F (36.5 C) (Oral)   Resp 15   SpO2 97%   Physical Exam Vitals signs and nursing note reviewed.  Constitutional:      Appearance: She is well-developed.  HENT:     Head: Normocephalic and atraumatic.     Right Ear: External ear normal.     Left Ear: External ear normal.     Nose: Nose normal.  Eyes:     General:        Right eye: No discharge.        Left eye: No discharge.     Pupils: Pupils are equal, round, and reactive to light.     Comments: At rest, the eyes are deviated to the right bilaterally.  Upon moving her eyes she can raise them up and  down and move them to the midline but not past.  When asked to follow my finger and test her convergence, her right eye does move past midline down and in. When testing Doll's eyes and she moves head back and forth, she cannot keep her eyes on my nose when her head moves to the right  Cardiovascular:     Rate and Rhythm: Normal rate and regular rhythm.     Heart sounds: Normal heart sounds.  Pulmonary:     Effort: Pulmonary effort is normal.     Breath sounds: Normal breath sounds.  Abdominal:     Palpations: Abdomen is soft.     Tenderness: There is no abdominal tenderness.  Skin:    General: Skin is warm and dry.  Neurological:     Mental Status: She is alert and oriented to person, place, and time.     Comments: CN 3-12 grossly intact. 5/5 strength in all 4 extremities. Grossly normal sensation. Normal finger to nose. When she gets up to walk she leans to the left  Psychiatric:        Mood and Affect: Mood is not anxious.      ED Treatments / Results  Labs (all labs ordered are listed, but only abnormal results are displayed) Labs Reviewed  COMPREHENSIVE METABOLIC PANEL - Abnormal; Notable for the following components:      Result Value   Glucose, Bld 120 (*)    Creatinine, Ser 1.02 (*)    Albumin 3.3 (*)    All other components within normal limits  CBG MONITORING, ED - Abnormal; Notable for the following components:   Glucose-Capillary 116 (*)    All other components within normal limits  PROTIME-INR  APTT  CBC  DIFFERENTIAL  I-STAT TROPONIN, ED  I-STAT CHEM 8, ED  I-STAT BETA HCG BLOOD, ED ([facility], [facility], [facility] ONLY)    EKG EKG Interpretation  Date/Time:  Sunday January 26 2018 03:14:13 EST Ventricular Rate:  70 PR Interval:    QRS Duration: 81 QT Interval:  392 QTC Calculation: 423 R Axis:  56 Text Interpretation:  Sinus rhythm Low voltage, precordial leads similar to Oct 2019 Confirmed by Sherwood Gambler [phone]) on 01/26/2018 3:48:28 AM   Radiology Ct Head Wo  Contrast  Result Date: 01/26/2018 CLINICAL DATA:  Acute onset of altered level of consciousness. Dizziness and double vision. Eyes drifting to the right. EXAM: CT HEAD WITHOUT CONTRAST TECHNIQUE: Contiguous axial images were obtained from the base of the skull through the vertex without intravenous contrast. COMPARISON:  MRI of the brain performed 11/28/2017 FINDINGS: Brain: No evidence of acute infarction, hemorrhage, hydrocephalus, extra-axial collection or mass lesion/mass effect. The posterior fossa, including the cerebellum, brainstem and fourth ventricle, is within normal limits. The third and lateral ventricles, and basal ganglia are unremarkable in appearance. The cerebral hemispheres are symmetric in appearance, with normal gray-white differentiation. No mass effect or midline shift is seen. Vascular: No hyperdense vessel or unexpected calcification. Skull: There is no evidence of fracture; visualized osseous structures are unremarkable in appearance. Sinuses/Orbits: There is deviation of gaze to the right. The orbits are otherwise unremarkable. The paranasal sinuses and mastoid air cells are well-aerated. Other: No significant soft tissue abnormalities are seen. IMPRESSION: 1. No acute intracranial pathology seen on CT. 2. Deviation of gaze to the right, as clinically described. Electronically Signed   By: Garald Balding M.D.   On: 01/26/2018 01:56    Procedures Procedures (including critical care time)  Medications Ordered in ED Medications - No data to display   Initial Impression / Assessment and Plan / ED Course  I have reviewed the triage vital signs and the nursing notes.  Pertinent labs & imaging results that were available during my care of the patient were reviewed by me and considered in my medical decision making (see chart for details).     At the time I am seeing patient she is well outside of TPA window and does not have signs or symptoms of a large vessel occlusion.   However my concern for stroke is pretty high.  I discussed with Dr. Cheral Marker, who recommends she be admitted and will need urgent MRI as well.  Main differential includes stroke versus MS, but either way she would need treatment in the hospital.  I discussed with the hospitalist, will admit to [facility].  They have had some holding for admissions at [facility] and so I have ordered MRI so that she can obtain it in the ED here if she is still present when MRI opens at 7 AM.  Final Clinical Impressions(s) / ED Diagnoses   Final diagnoses:  Gaze palsy    ED Discharge Orders    None       Sherwood Gambler, MD 01/26/18 [phone]

## 2018-01-26 NOTE — Progress Notes (Signed)
Pt. Has arrived to room 3W14. Pt is alert and oriented x4. Denies pain. All belongings at bedside. Nurse will continue to monitor.

## 2018-01-26 NOTE — ED Notes (Signed)
[facility] has arrived to transport patient to [facility].

## 2018-01-26 NOTE — ED Notes (Signed)
[facility] has been notified of patient needing transport to [facility].

## 2018-01-27 ENCOUNTER — Other Ambulatory Visit: Payer: Self-pay

## 2018-01-27 ENCOUNTER — Encounter (HOSPITAL_COMMUNITY): Payer: Self-pay | Admitting: *Deleted

## 2018-01-27 ENCOUNTER — Observation Stay (HOSPITAL_COMMUNITY): Payer: 59

## 2018-01-27 ENCOUNTER — Inpatient Hospital Stay (HOSPITAL_COMMUNITY): Payer: 59

## 2018-01-27 ENCOUNTER — Ambulatory Visit (HOSPITAL_BASED_OUTPATIENT_CLINIC_OR_DEPARTMENT_OTHER): Payer: 59

## 2018-01-27 DIAGNOSIS — C50919 Malignant neoplasm of unspecified site of unspecified female breast: Secondary | ICD-10-CM | POA: Diagnosis not present

## 2018-01-27 DIAGNOSIS — C50912 Malignant neoplasm of unspecified site of left female breast: Secondary | ICD-10-CM | POA: Diagnosis not present

## 2018-01-27 DIAGNOSIS — R590 Localized enlarged lymph nodes: Secondary | ICD-10-CM | POA: Diagnosis not present

## 2018-01-27 DIAGNOSIS — I639 Cerebral infarction, unspecified: Secondary | ICD-10-CM

## 2018-01-27 DIAGNOSIS — Z96643 Presence of artificial hip joint, bilateral: Secondary | ICD-10-CM | POA: Diagnosis present

## 2018-01-27 DIAGNOSIS — Z853 Personal history of malignant neoplasm of breast: Secondary | ICD-10-CM | POA: Diagnosis not present

## 2018-01-27 DIAGNOSIS — Z7982 Long term (current) use of aspirin: Secondary | ICD-10-CM | POA: Diagnosis not present

## 2018-01-27 DIAGNOSIS — Z803 Family history of malignant neoplasm of breast: Secondary | ICD-10-CM | POA: Diagnosis not present

## 2018-01-27 DIAGNOSIS — R29818 Other symptoms and signs involving the nervous system: Secondary | ICD-10-CM

## 2018-01-27 DIAGNOSIS — I6340 Cerebral infarction due to embolism of unspecified cerebral artery: Secondary | ICD-10-CM

## 2018-01-27 DIAGNOSIS — Z90710 Acquired absence of both cervix and uterus: Secondary | ICD-10-CM | POA: Diagnosis not present

## 2018-01-27 DIAGNOSIS — Z808 Family history of malignant neoplasm of other organs or systems: Secondary | ICD-10-CM | POA: Diagnosis not present

## 2018-01-27 DIAGNOSIS — J45909 Unspecified asthma, uncomplicated: Secondary | ICD-10-CM | POA: Diagnosis not present

## 2018-01-27 DIAGNOSIS — J910 Malignant pleural effusion: Secondary | ICD-10-CM | POA: Diagnosis not present

## 2018-01-27 DIAGNOSIS — G939 Disorder of brain, unspecified: Secondary | ICD-10-CM | POA: Diagnosis not present

## 2018-01-27 DIAGNOSIS — Q446 Cystic disease of liver: Secondary | ICD-10-CM | POA: Diagnosis not present

## 2018-01-27 DIAGNOSIS — K5190 Ulcerative colitis, unspecified, without complications: Secondary | ICD-10-CM | POA: Diagnosis present

## 2018-01-27 DIAGNOSIS — Z9221 Personal history of antineoplastic chemotherapy: Secondary | ICD-10-CM | POA: Diagnosis not present

## 2018-01-27 DIAGNOSIS — E1169 Type 2 diabetes mellitus with other specified complication: Secondary | ICD-10-CM | POA: Diagnosis not present

## 2018-01-27 DIAGNOSIS — I10 Essential (primary) hypertension: Secondary | ICD-10-CM

## 2018-01-27 DIAGNOSIS — C50412 Malignant neoplasm of upper-outer quadrant of left female breast: Secondary | ICD-10-CM | POA: Diagnosis not present

## 2018-01-27 DIAGNOSIS — C770 Secondary and unspecified malignant neoplasm of lymph nodes of head, face and neck: Secondary | ICD-10-CM | POA: Diagnosis not present

## 2018-01-27 DIAGNOSIS — J90 Pleural effusion, not elsewhere classified: Secondary | ICD-10-CM | POA: Diagnosis not present

## 2018-01-27 DIAGNOSIS — F419 Anxiety disorder, unspecified: Secondary | ICD-10-CM | POA: Diagnosis present

## 2018-01-27 DIAGNOSIS — C787 Secondary malignant neoplasm of liver and intrahepatic bile duct: Secondary | ICD-10-CM | POA: Diagnosis present

## 2018-01-27 DIAGNOSIS — Z87891 Personal history of nicotine dependence: Secondary | ICD-10-CM | POA: Diagnosis not present

## 2018-01-27 DIAGNOSIS — C7931 Secondary malignant neoplasm of brain: Secondary | ICD-10-CM | POA: Diagnosis not present

## 2018-01-27 DIAGNOSIS — D6869 Other thrombophilia: Secondary | ICD-10-CM | POA: Diagnosis present

## 2018-01-27 DIAGNOSIS — Z800 Family history of malignant neoplasm of digestive organs: Secondary | ICD-10-CM | POA: Diagnosis not present

## 2018-01-27 DIAGNOSIS — J9811 Atelectasis: Secondary | ICD-10-CM | POA: Diagnosis not present

## 2018-01-27 DIAGNOSIS — C384 Malignant neoplasm of pleura: Secondary | ICD-10-CM | POA: Diagnosis not present

## 2018-01-27 DIAGNOSIS — C773 Secondary and unspecified malignant neoplasm of axilla and upper limb lymph nodes: Secondary | ICD-10-CM | POA: Diagnosis present

## 2018-01-27 DIAGNOSIS — H510 Palsy (spasm) of conjugate gaze: Secondary | ICD-10-CM | POA: Diagnosis not present

## 2018-01-27 DIAGNOSIS — C781 Secondary malignant neoplasm of mediastinum: Secondary | ICD-10-CM | POA: Diagnosis not present

## 2018-01-27 DIAGNOSIS — I63413 Cerebral infarction due to embolism of bilateral middle cerebral arteries: Secondary | ICD-10-CM | POA: Diagnosis present

## 2018-01-27 DIAGNOSIS — C799 Secondary malignant neoplasm of unspecified site: Secondary | ICD-10-CM | POA: Diagnosis not present

## 2018-01-27 DIAGNOSIS — Z923 Personal history of irradiation: Secondary | ICD-10-CM | POA: Diagnosis not present

## 2018-01-27 DIAGNOSIS — E119 Type 2 diabetes mellitus without complications: Secondary | ICD-10-CM

## 2018-01-27 DIAGNOSIS — Z79899 Other long term (current) drug therapy: Secondary | ICD-10-CM | POA: Diagnosis not present

## 2018-01-27 DIAGNOSIS — G629 Polyneuropathy, unspecified: Secondary | ICD-10-CM | POA: Diagnosis not present

## 2018-01-27 DIAGNOSIS — Z7984 Long term (current) use of oral hypoglycemic drugs: Secondary | ICD-10-CM | POA: Diagnosis not present

## 2018-01-27 DIAGNOSIS — K51919 Ulcerative colitis, unspecified with unspecified complications: Secondary | ICD-10-CM | POA: Diagnosis not present

## 2018-01-27 DIAGNOSIS — Z79811 Long term (current) use of aromatase inhibitors: Secondary | ICD-10-CM | POA: Diagnosis not present

## 2018-01-27 DIAGNOSIS — Z7189 Other specified counseling: Secondary | ICD-10-CM | POA: Diagnosis not present

## 2018-01-27 LAB — GLUCOSE, CAPILLARY
Glucose-Capillary: 97 mg/dL (ref 70–99)
Glucose-Capillary: 91 mg/dL (ref 70–99)
Glucose-Capillary: 89 mg/dL (ref 70–99)
Glucose-Capillary: 103 mg/dL — ABNORMAL HIGH (ref 70–99)

## 2018-01-27 LAB — ECHOCARDIOGRAM COMPLETE
Height: 64 in
Weight: 2768 oz

## 2018-01-27 MED ORDER — SODIUM CHLORIDE 0.9 % IV SOLN
INTRAVENOUS | Status: DC
  Administered 2018-01-27 – 2018-01-30 (×5): via INTRAVENOUS

## 2018-01-27 MED ORDER — IOPAMIDOL (ISOVUE-370) INJECTION 76%
75.0000 mL | Freq: Once | INTRAVENOUS | Status: AC | PRN
  Administered 2018-01-27: 100 mL via INTRAVENOUS

## 2018-01-27 MED ORDER — IOPAMIDOL (ISOVUE-370) INJECTION 76%
INTRAVENOUS | Status: AC
  Filled 2018-01-27: qty 100

## 2018-01-27 NOTE — Progress Notes (Addendum)
PROGRESS NOTE    Brandi Dickson  KYH:[id number] DOB: 04-Jan-1961 DOA: 01/26/2018 PCP: Orpah Melter, MD   Brief Narrative:  HPI On 01/26/2018 by Dr. Shela Leff Brandi Dickson is a 57 y.o. female with medical history significant of stage IIa breast cancer on anastrozole, peripheral neuropathy, type 2 diabetes, hypertension, ulcerative colitis presenting to the hospital for evaluation of blurred vision and difficulty walking.  Patient states at 8:30 PM on 12/28 she noticed that her eyes shifted to the right and she could not look to her left.  Her vision turns blurry every time she tries to move her eyes toward the midline.  Has a family history of stroke but no personal history.  States she takes a baby aspirin daily for primary prevention.  States she does not want to take a statin as her mother had experienced side effects from this drug.  Patient is requesting low-dose Ativan for anxiety prior to MRI.  Interim history Patient admitted with visual deficits and inability to deviate eyes to the left.  Found to have acute CVA on MRI.  Neurology consulted and appreciated, requesting further imaging and work-up. Assessment & Plan   Gaze deviation to the right, Acute CVA -On examination, patient is unable to cross midline to the left bilaterally -Possibly CVA versus demyelinating disease -CT head: No acute intercranial pathology seen on CT -Neurology consulted and appreciated, recommending MRI brain -MRI brain showed small scattered acute infarcts in the bilateral cerebellum, right cerebellum, left posterior pons.  Pontine infarct is near the facial colliculus and correlates with the visual complaints.  Pattern suggests embolic disease -Continue neuro checks -Discussed briefly with Dr. Erlinda Hong, neurology, recommended MRI brain with IV contrast as well as CTA head and neck, CT chest abdomen and pelvis to investigate for possible malignancy -CTA head and neck showed malignant appearing  adenopathy in the chest and left lower neck presumably related to patient's history of breast cancer.  Bilateral upper lobe subpleural nodularity suggesting lymphatic spread of tumor.  Moderate right pleural effusion. -LDL and hemoglobin A1c pending -Echocardiogram pending -Lower extremity Doppler negative for DVT -Pending PT -OT recommended outpatient Occupational Therapy  Stage IIa breast cancer -Currently on anastrozole and followed by oncology, [name] -Oncology consulted and appreciated   Ulcerative colitis and chronic diarrhea -Continue mesalamine, Imodium, Bentyl  Diabetes mellitus, type II -Hemoglobin A1c 5.9 -Continue insulin sliding scale with CBG monitoring  Essential hypertension -Given that CVA is suspected, allowing for permissive hypertension  Asthma -stable, no wheezing on examination -Continue albuterol as needed  Neuropathy -Continue gabapentin  DVT Prophylaxis  lovenox  Code Status: Full  Family Communication: None at bedside  Disposition Plan: Currently in observation.  Patient requires an inpatient admission in my opinion as she will continue to need hospitalization giving the nature of her acute CVA.  Pending further CVA work-up.  Consultants Neurology Oncology   Procedures  LE doppler  Echocardiogram  Antibiotics   Anti-infectives (From admission, onward)   None      Subjective:   Brandi Dickson seen and examined today.  Has no complaints this morning of chest pain, shortness of breath, abdominal pain, nausea or vomiting, diarrhea or constipation, dizziness or headache.  Still unable to move her eyes to the left.  Objective:   Vitals:   01/26/18 2352 01/27/18 0046 01/27/18 0355 01/27/18 1138  BP: 116/76  119/76 120/79  Pulse: 81  95 88  Resp: 16  20 18   Temp: 98.3 F (36.8 C)  98.2  F (36.8 C) 98.5 F (36.9 C)  TempSrc: Oral  Oral Oral  SpO2: 100%  98% 98%  Weight:  78.5 kg    Height:  5' 4"  (1.626 m)     No intake or  output data in the 24 hours ending 01/27/18 1220 Filed Weights   01/27/18 0046  Weight: 78.5 kg   Exam  General: Well developed, well nourished, NAD, appears stated age  HEENT: NCAT, mucous membranes moist.   Neck: Supple  Cardiovascular: S1 S2 auscultated, no rubs, murmurs or gallops. Regular rate and rhythm.  Respiratory: Clear to auscultation bilaterally with equal chest rise  Abdomen: Soft, nontender, nondistended, + bowel sounds  Extremities: warm dry without cyanosis clubbing or edema  Neuro: AAOx3, unable to look to the left bilaterally otherwise nonfocal  Psych: Pleasant, appropriate mood and affect  Data Reviewed: I have personally reviewed following labs and imaging studies  CBC: Recent Labs  Lab 01/24/18 0753 01/26/18 0051  WBC 7.4 7.8  NEUTROABS 4.9 5.2  HGB 12.9 12.1  HCT 42.0 39.7  MCV 86.2 85.7  PLT 305 355   Basic Metabolic Panel: Recent Labs  Lab 01/24/18 0753 01/26/18 0051  NA 140 138  K 4.0 3.7  CL 103 103  CO2 28 27  GLUCOSE 103* 120*  BUN 8 17  CREATININE 0.98 1.02*  CALCIUM 9.6 9.0   GFR: Estimated Creatinine Clearance: 60.2 mL/min (A) (by C-G formula based on SCr of 1.02 mg/dL (H)). Liver Function Tests: Recent Labs  Lab 01/24/18 0753 01/26/18 0051  AST 20 20  ALT 15 15  ALKPHOS 106 85  BILITOT 0.3 0.4  PROT 7.6 7.2  ALBUMIN 3.2* 3.3*   No results for input(s): LIPASE, AMYLASE in the last 168 hours. No results for input(s): AMMONIA in the last 168 hours. Coagulation Profile: Recent Labs  Lab 01/26/18 0051  INR 0.98   Cardiac Enzymes: No results for input(s): CKTOTAL, CKMB, CKMBINDEX, TROPONINI in the last 168 hours. BNP (last 3 results) No results for input(s): PROBNP in the last 8760 hours. HbA1C: No results for input(s): HGBA1C in the last 72 hours. CBG: Recent Labs  Lab 01/26/18 0044 01/26/18 1326 01/26/18 1626 01/26/18 2132  GLUCAP 116* 74 84 115*   Lipid Profile: No results for input(s): CHOL, HDL,  LDLCALC, TRIG, CHOLHDL, LDLDIRECT in the last 72 hours. Thyroid Function Tests: No results for input(s): TSH, T4TOTAL, FREET4, T3FREE, THYROIDAB in the last 72 hours. Anemia Panel: No results for input(s): VITAMINB12, FOLATE, FERRITIN, TIBC, IRON, RETICCTPCT in the last 72 hours. Urine analysis:    Component Value Date/Time   COLORURINE YELLOW 11/28/2017 2059   APPEARANCEUR CLEAR 11/28/2017 2059   LABSPEC 1.011 11/28/2017 2059   PHURINE 6.0 11/28/2017 2059   GLUCOSEU NEGATIVE 11/28/2017 2059   HGBUR NEGATIVE 11/28/2017 2059   BILIRUBINUR NEGATIVE 11/28/2017 2059   [place] NEGATIVE 11/28/2017 2059   PROTEINUR NEGATIVE 11/28/2017 2059   UROBILINOGEN 0.2 12/30/2008 1317   NITRITE NEGATIVE 11/28/2017 2059   LEUKOCYTESUR TRACE (A) 11/28/2017 2059   Sepsis Labs: @LABRCNTIP (procalcitonin:4,lacticidven:4)  )No results found for this or any previous visit (from the past 240 hour(s)).    Radiology Studies: Ct Angio Head W Or Wo Contrast  Result Date: 01/27/2018 CLINICAL DATA:  Stroke follow-up EXAM: CT ANGIOGRAPHY HEAD AND NECK TECHNIQUE: Multidetector CT imaging of the head and neck was performed using the standard protocol during bolus administration of intravenous contrast. Multiplanar CT image reconstructions and MIPs were obtained to evaluate the vascular anatomy. Carotid  stenosis measurements (when applicable) are obtained utilizing NASCET criteria, using the distal internal carotid diameter as the denominator. CONTRAST:  187m ISOVUE-370 IOPAMIDOL (ISOVUE-370) INJECTION 76% COMPARISON:  Head CT and brain MRI from yesterday FINDINGS: CTA NECK FINDINGS Aortic arch: Normal appearance Right carotid system: Vessels are smooth and widely patent Left carotid system: Vessels are smooth and widely patent. No noted atheromatous changes Vertebral arteries: Vessels are smooth and widely patent. Early origin left PICA which branches from the V3 segment. Skeleton: Degenerative disease without acute  finding Other neck: Enlarged lymph nodes in the low left posterior triangle and supraclavicular fossa. Incidental right thyroid nodule Upper chest: There is worrisome, centrally low-dense adenopathy in the bilateral mediastinum, thickest at the level of the subcarinal space where there is a 2 cm diameter. Layering right pleural effusion, at least moderate. There is subpleural nodularity and both upper lobes. Review of the MIP images confirms the above findings CTA HEAD FINDINGS Anterior circulation: Vessels are smooth and widely patent Posterior circulation: Vessels are smooth and widely patent. Hypoplastic left P1 segment Venous sinuses: Patent Anatomic variants: As above Delayed phase: No abnormal intracranial enhancement Review of the MIP images confirms the above findings IMPRESSION: 1. Malignant-appearing adenopathy in the chest and left lower neck presumably related patient's history of breast cancer. Bilateral upper lobe subpleural nodularity suggesting lymphatic spread of tumor. Moderate right pleural effusion. 2. No significant vascular finding. Electronically Signed   By: JMonte FantasiaM.D.   On: 01/27/2018 09:59   Ct Head Wo Contrast  Result Date: 01/26/2018 CLINICAL DATA:  Acute onset of altered level of consciousness. Dizziness and double vision. Eyes drifting to the right. EXAM: CT HEAD WITHOUT CONTRAST TECHNIQUE: Contiguous axial images were obtained from the base of the skull through the vertex without intravenous contrast. COMPARISON:  MRI of the brain performed 11/28/2017 FINDINGS: Brain: No evidence of acute infarction, hemorrhage, hydrocephalus, extra-axial collection or mass lesion/mass effect. The posterior fossa, including the cerebellum, brainstem and fourth ventricle, is within normal limits. The third and lateral ventricles, and basal ganglia are unremarkable in appearance. The cerebral hemispheres are symmetric in appearance, with normal gray-white differentiation. No mass effect or  midline shift is seen. Vascular: No hyperdense vessel or unexpected calcification. Skull: There is no evidence of fracture; visualized osseous structures are unremarkable in appearance. Sinuses/Orbits: There is deviation of gaze to the right. The orbits are otherwise unremarkable. The paranasal sinuses and mastoid air cells are well-aerated. Other: No significant soft tissue abnormalities are seen. IMPRESSION: 1. No acute intracranial pathology seen on CT. 2. Deviation of gaze to the right, as clinically described. Electronically Signed   By: JGarald BaldingM.D.   On: 01/26/2018 01:56   Ct Angio Neck W Or Wo Contrast  Result Date: 01/27/2018 CLINICAL DATA:  Stroke follow-up EXAM: CT ANGIOGRAPHY HEAD AND NECK TECHNIQUE: Multidetector CT imaging of the head and neck was performed using the standard protocol during bolus administration of intravenous contrast. Multiplanar CT image reconstructions and MIPs were obtained to evaluate the vascular anatomy. Carotid stenosis measurements (when applicable) are obtained utilizing NASCET criteria, using the distal internal carotid diameter as the denominator. CONTRAST:  1069mISOVUE-370 IOPAMIDOL (ISOVUE-370) INJECTION 76% COMPARISON:  Head CT and brain MRI from yesterday FINDINGS: CTA NECK FINDINGS Aortic arch: Normal appearance Right carotid system: Vessels are smooth and widely patent Left carotid system: Vessels are smooth and widely patent. No noted atheromatous changes Vertebral arteries: Vessels are smooth and widely patent. Early origin left PICA which branches from  the V3 segment. Skeleton: Degenerative disease without acute finding Other neck: Enlarged lymph nodes in the low left posterior triangle and supraclavicular fossa. Incidental right thyroid nodule Upper chest: There is worrisome, centrally low-dense adenopathy in the bilateral mediastinum, thickest at the level of the subcarinal space where there is a 2 cm diameter. Layering right pleural effusion, at  least moderate. There is subpleural nodularity and both upper lobes. Review of the MIP images confirms the above findings CTA HEAD FINDINGS Anterior circulation: Vessels are smooth and widely patent Posterior circulation: Vessels are smooth and widely patent. Hypoplastic left P1 segment Venous sinuses: Patent Anatomic variants: As above Delayed phase: No abnormal intracranial enhancement Review of the MIP images confirms the above findings IMPRESSION: 1. Malignant-appearing adenopathy in the chest and left lower neck presumably related patient's history of breast cancer. Bilateral upper lobe subpleural nodularity suggesting lymphatic spread of tumor. Moderate right pleural effusion. 2. No significant vascular finding. Electronically Signed   By: Monte Fantasia M.D.   On: 01/27/2018 09:59   Mr Brain Wo Contrast  Result Date: 01/26/2018 CLINICAL DATA:  Blurred vision and difficulty walking EXAM: MRI HEAD WITHOUT CONTRAST TECHNIQUE: Multiplanar, multiecho pulse sequences of the brain and surrounding structures were obtained without intravenous contrast. COMPARISON:  Head CT from earlier today FINDINGS: Brain: Subcentimeter foci of restricted diffusion along the left para median floor of the fourth ventricle (near the facial colliculus), right cerebellum, bilateral occipital cortex, and bilateral posterior frontal cortex, and right centrum semiovale. No prior ischemic injury is noted. No hemorrhage, hydrocephalus, or masslike finding Vascular: Major flow voids are preserved Skull and upper cervical spine: Negative for marrow lesion Sinuses/Orbits: Negative IMPRESSION: Small scattered acute infarcts in the bilateral cerebrum, right cerebellum, and left posterior pons. The pontine infarct is near the facial colliculus and correlates with the visual complaints. The pattern suggests central embolic disease. Electronically Signed   By: Monte Fantasia M.D.   On: 01/26/2018 12:20   Vas [place] Lower Extremity Venous  (dvt)  Result Date: 01/27/2018  Lower Venous Study Indications: Stroke.  Performing Technologist: Abram Sander RVS  Examination Guidelines: A complete evaluation includes B-mode imaging, spectral Doppler, color Doppler, and power Doppler as needed of all accessible portions of each vessel. Bilateral testing is considered an integral part of a complete examination. Limited examinations for reoccurring indications may be performed as noted.  Right Venous Findings: +---------+---------------+---------+-----------+----------+-------+          CompressibilityPhasicitySpontaneityPropertiesSummary +---------+---------------+---------+-----------+----------+-------+ CFV      Full           Yes      Yes                          +---------+---------------+---------+-----------+----------+-------+ SFJ      Full                                                 +---------+---------------+---------+-----------+----------+-------+ FV Prox  Full                                                 +---------+---------------+---------+-----------+----------+-------+ FV Mid   Full                                                 +---------+---------------+---------+-----------+----------+-------+  FV DistalFull                                                 +---------+---------------+---------+-----------+----------+-------+ PFV      Full                                                 +---------+---------------+---------+-----------+----------+-------+ POP      Full           Yes      Yes                          +---------+---------------+---------+-----------+----------+-------+ PTV      Full                                                 +---------+---------------+---------+-----------+----------+-------+ PERO     Full                                                 +---------+---------------+---------+-----------+----------+-------+  Left Venous Findings:  +---------+---------------+---------+-----------+----------+-------+          CompressibilityPhasicitySpontaneityPropertiesSummary +---------+---------------+---------+-----------+----------+-------+ CFV      Full           Yes      Yes                          +---------+---------------+---------+-----------+----------+-------+ SFJ      Full                                                 +---------+---------------+---------+-----------+----------+-------+ FV Prox  Full                                                 +---------+---------------+---------+-----------+----------+-------+ FV Mid   Full                                                 +---------+---------------+---------+-----------+----------+-------+ FV DistalFull                                                 +---------+---------------+---------+-----------+----------+-------+ PFV      Full                                                 +---------+---------------+---------+-----------+----------+-------+  POP      Full           Yes      Yes                          +---------+---------------+---------+-----------+----------+-------+ PTV      Full                                                 +---------+---------------+---------+-----------+----------+-------+ PERO     Full                                                 +---------+---------------+---------+-----------+----------+-------+    Summary: Right: There is no evidence of deep vein thrombosis in the lower extremity. No cystic structure found in the popliteal fossa. Left: There is no evidence of deep vein thrombosis in the lower extremity. No cystic structure found in the popliteal fossa.  *See table(s) above for measurements and observations.    Preliminary      Scheduled Meds: . acidophilus  1 capsule Oral Daily  . anastrozole  1 mg Oral Daily  . aspirin EC  325 mg Oral Daily  . enoxaparin (LOVENOX) injection   40 mg Subcutaneous Q24H  . gabapentin  200 mg Oral QHS  . insulin aspart  0-9 Units Subcutaneous TID WC  . Mesalamine  800 mg Oral BID   Continuous Infusions: . sodium chloride       LOS: 0 days   Time Spent in minutes   30 minutes  Aviendha Azbell D.O. on 01/27/2018 at 12:20 PM  Between 7am to 7pm - Please see pager noted on [email or web address]  After 7pm go to [email or web address]  And look for the night coverage person covering for me after hours  [facility] Office  [phone]

## 2018-01-27 NOTE — Progress Notes (Signed)
2D Echocardiogram has been performed.  Brandi Dickson 01/27/2018, 10:21 AM

## 2018-01-27 NOTE — Progress Notes (Addendum)
STROKE TEAM PROGRESS NOTE   SUBJECTIVE (INTERVAL HISTORY) Her husband is at the bedside.  Overall her condition is stable.  Still has left CN VI nerve palsy and diplopia on left gaze.  Otherwise, neurologically intact.  Patient stated that she had breast cancer status post surgery, chemo and radiation.  At this time, she is only on anastrozole for treatment.  MRI brain with contrast has not done yet due to " allergy to gadolinium".  However, when asked patient about allergy, she said that she did not have any allergy to gadolinium but in 10/2017 she was given too much Ativan for MRI in preparation which caused her nausea vomiting with Ativan not contrast.  Discussed with MRI, will proceed with MRI brain with contrast tonight.  Her CTA head and neck showed normal brain vessel however concerning for metastasis to mediastinum and bilateral pleural space.  Will do CT chest abdomen pelvis tomorrow.   OBJECTIVE Temp:  [98.2 F (36.8 C)-98.5 F (36.9 C)] 98.2 F (36.8 C) (12/30 1634) Pulse Rate:  [81-95] 88 (12/30 1634) Cardiac Rhythm: Normal sinus rhythm (12/30 0800) Resp:  [16-20] 18 (12/30 1138) BP: (116-129)/(76-79) 129/79 (12/30 1634) SpO2:  [98 %-100 %] 98 % (12/30 1634) Weight:  [78.5 kg] 78.5 kg (12/30 0046)  Recent Labs  Lab 01/26/18 1326 01/26/18 1626 01/26/18 2132 01/27/18 0639 01/27/18 1137  GLUCAP 74 84 115* 97 91   Recent Labs  Lab 01/24/18 0753 01/26/18 0051  NA 140 138  K 4.0 3.7  CL 103 103  CO2 28 27  GLUCOSE 103* 120*  BUN 8 17  CREATININE 0.98 1.02*  CALCIUM 9.6 9.0   Recent Labs  Lab 01/24/18 0753 01/26/18 0051  AST 20 20  ALT 15 15  ALKPHOS 106 85  BILITOT 0.3 0.4  PROT 7.6 7.2  ALBUMIN 3.2* 3.3*   Recent Labs  Lab 01/24/18 0753 01/26/18 0051  WBC 7.4 7.8  NEUTROABS 4.9 5.2  HGB 12.9 12.1  HCT 42.0 39.7  MCV 86.2 85.7  PLT 305 296   No results for input(s): CKTOTAL, CKMB, CKMBINDEX, TROPONINI in the last 168 hours. Recent Labs     01/26/18 0051  LABPROT 12.8  INR 0.98   No results for input(s): COLORURINE, LABSPEC, PHURINE, GLUCOSEU, HGBUR, BILIRUBINUR, KETONESUR, PROTEINUR, UROBILINOGEN, NITRITE, LEUKOCYTESUR in the last 72 hours.  Invalid input(s): APPERANCEUR  No results found for: CHOL, TRIG, HDL, CHOLHDL, VLDL, LDLCALC Lab Results  Component Value Date   HGBA1C 5.9 (H) 06/01/2017      Component Value Date/Time   LABOPIA NONE DETECTED 11/28/2017 2059   COCAINSCRNUR NONE DETECTED 11/28/2017 2059   LABBENZ NONE DETECTED 11/28/2017 2059   AMPHETMU NONE DETECTED 11/28/2017 2059   THCU NONE DETECTED 11/28/2017 2059   LABBARB NONE DETECTED 11/28/2017 2059    No results for input(s): ETH in the last 168 hours.  I have personally reviewed the radiological images below and agree with the radiology interpretations.  Ct Angio Head W Or Wo Contrast  Result Date: 01/27/2018 CLINICAL DATA:  Stroke follow-up EXAM: CT ANGIOGRAPHY HEAD AND NECK TECHNIQUE: Multidetector CT imaging of the head and neck was performed using the standard protocol during bolus administration of intravenous contrast. Multiplanar CT image reconstructions and MIPs were obtained to evaluate the vascular anatomy. Carotid stenosis measurements (when applicable) are obtained utilizing NASCET criteria, using the distal internal carotid diameter as the denominator. CONTRAST:  148m ISOVUE-370 IOPAMIDOL (ISOVUE-370) INJECTION 76% COMPARISON:  Head CT and brain MRI from yesterday FINDINGS:  CTA NECK FINDINGS Aortic arch: Normal appearance Right carotid system: Vessels are smooth and widely patent Left carotid system: Vessels are smooth and widely patent. No noted atheromatous changes Vertebral arteries: Vessels are smooth and widely patent. Early origin left PICA which branches from the V3 segment. Skeleton: Degenerative disease without acute finding Other neck: Enlarged lymph nodes in the low left posterior triangle and supraclavicular fossa. Incidental right  thyroid nodule Upper chest: There is worrisome, centrally low-dense adenopathy in the bilateral mediastinum, thickest at the level of the subcarinal space where there is a 2 cm diameter. Layering right pleural effusion, at least moderate. There is subpleural nodularity and both upper lobes. Review of the MIP images confirms the above findings CTA HEAD FINDINGS Anterior circulation: Vessels are smooth and widely patent Posterior circulation: Vessels are smooth and widely patent. Hypoplastic left P1 segment Venous sinuses: Patent Anatomic variants: As above Delayed phase: No abnormal intracranial enhancement Review of the MIP images confirms the above findings IMPRESSION: 1. Malignant-appearing adenopathy in the chest and left lower neck presumably related patient's history of breast cancer. Bilateral upper lobe subpleural nodularity suggesting lymphatic spread of tumor. Moderate right pleural effusion. 2. No significant vascular finding. Electronically Signed   By: Monte Fantasia M.D.   On: 01/27/2018 09:59   Ct Head Wo Contrast  Result Date: 01/26/2018 CLINICAL DATA:  Acute onset of altered level of consciousness. Dizziness and double vision. Eyes drifting to the right. EXAM: CT HEAD WITHOUT CONTRAST TECHNIQUE: Contiguous axial images were obtained from the base of the skull through the vertex without intravenous contrast. COMPARISON:  MRI of the brain performed 11/28/2017 FINDINGS: Brain: No evidence of acute infarction, hemorrhage, hydrocephalus, extra-axial collection or mass lesion/mass effect. The posterior fossa, including the cerebellum, brainstem and fourth ventricle, is within normal limits. The third and lateral ventricles, and basal ganglia are unremarkable in appearance. The cerebral hemispheres are symmetric in appearance, with normal gray-white differentiation. No mass effect or midline shift is seen. Vascular: No hyperdense vessel or unexpected calcification. Skull: There is no evidence of  fracture; visualized osseous structures are unremarkable in appearance. Sinuses/Orbits: There is deviation of gaze to the right. The orbits are otherwise unremarkable. The paranasal sinuses and mastoid air cells are well-aerated. Other: No significant soft tissue abnormalities are seen. IMPRESSION: 1. No acute intracranial pathology seen on CT. 2. Deviation of gaze to the right, as clinically described. Electronically Signed   By: Garald Balding M.D.   On: 01/26/2018 01:56   Ct Angio Neck W Or Wo Contrast  Result Date: 01/27/2018 CLINICAL DATA:  Stroke follow-up EXAM: CT ANGIOGRAPHY HEAD AND NECK TECHNIQUE: Multidetector CT imaging of the head and neck was performed using the standard protocol during bolus administration of intravenous contrast. Multiplanar CT image reconstructions and MIPs were obtained to evaluate the vascular anatomy. Carotid stenosis measurements (when applicable) are obtained utilizing NASCET criteria, using the distal internal carotid diameter as the denominator. CONTRAST:  138m ISOVUE-370 IOPAMIDOL (ISOVUE-370) INJECTION 76% COMPARISON:  Head CT and brain MRI from yesterday FINDINGS: CTA NECK FINDINGS Aortic arch: Normal appearance Right carotid system: Vessels are smooth and widely patent Left carotid system: Vessels are smooth and widely patent. No noted atheromatous changes Vertebral arteries: Vessels are smooth and widely patent. Early origin left PICA which branches from the V3 segment. Skeleton: Degenerative disease without acute finding Other neck: Enlarged lymph nodes in the low left posterior triangle and supraclavicular fossa. Incidental right thyroid nodule Upper chest: There is worrisome, centrally low-dense adenopathy in  the bilateral mediastinum, thickest at the level of the subcarinal space where there is a 2 cm diameter. Layering right pleural effusion, at least moderate. There is subpleural nodularity and both upper lobes. Review of the MIP images confirms the above  findings CTA HEAD FINDINGS Anterior circulation: Vessels are smooth and widely patent Posterior circulation: Vessels are smooth and widely patent. Hypoplastic left P1 segment Venous sinuses: Patent Anatomic variants: As above Delayed phase: No abnormal intracranial enhancement Review of the MIP images confirms the above findings IMPRESSION: 1. Malignant-appearing adenopathy in the chest and left lower neck presumably related patient's history of breast cancer. Bilateral upper lobe subpleural nodularity suggesting lymphatic spread of tumor. Moderate right pleural effusion. 2. No significant vascular finding. Electronically Signed   By: Monte Fantasia M.D.   On: 01/27/2018 09:59   Mr Brain Wo Contrast  Result Date: 01/26/2018 CLINICAL DATA:  Blurred vision and difficulty walking EXAM: MRI HEAD WITHOUT CONTRAST TECHNIQUE: Multiplanar, multiecho pulse sequences of the brain and surrounding structures were obtained without intravenous contrast. COMPARISON:  Head CT from earlier today FINDINGS: Brain: Subcentimeter foci of restricted diffusion along the left para median floor of the fourth ventricle (near the facial colliculus), right cerebellum, bilateral occipital cortex, and bilateral posterior frontal cortex, and right centrum semiovale. No prior ischemic injury is noted. No hemorrhage, hydrocephalus, or masslike finding Vascular: Major flow voids are preserved Skull and upper cervical spine: Negative for marrow lesion Sinuses/Orbits: Negative IMPRESSION: Small scattered acute infarcts in the bilateral cerebrum, right cerebellum, and left posterior pons. The pontine infarct is near the facial colliculus and correlates with the visual complaints. The pattern suggests central embolic disease. Electronically Signed   By: Monte Fantasia M.D.   On: 01/26/2018 12:20   Vas [place] Lower Extremity Venous (dvt)  Result Date: 01/27/2018  Lower Venous Study Indications: Stroke.  Performing Technologist: Abram Sander RVS   Examination Guidelines: A complete evaluation includes B-mode imaging, spectral Doppler, color Doppler, and power Doppler as needed of all accessible portions of each vessel. Bilateral testing is considered an integral part of a complete examination. Limited examinations for reoccurring indications may be performed as noted.  Right Venous Findings: +---------+---------------+---------+-----------+----------+-------+          CompressibilityPhasicitySpontaneityPropertiesSummary +---------+---------------+---------+-----------+----------+-------+ CFV      Full           Yes      Yes                          +---------+---------------+---------+-----------+----------+-------+ SFJ      Full                                                 +---------+---------------+---------+-----------+----------+-------+ FV Prox  Full                                                 +---------+---------------+---------+-----------+----------+-------+ FV Mid   Full                                                 +---------+---------------+---------+-----------+----------+-------+ FV DistalFull                                                 +---------+---------------+---------+-----------+----------+-------+  PFV      Full                                                 +---------+---------------+---------+-----------+----------+-------+ POP      Full           Yes      Yes                          +---------+---------------+---------+-----------+----------+-------+ PTV      Full                                                 +---------+---------------+---------+-----------+----------+-------+ PERO     Full                                                 +---------+---------------+---------+-----------+----------+-------+  Left Venous Findings: +---------+---------------+---------+-----------+----------+-------+           CompressibilityPhasicitySpontaneityPropertiesSummary +---------+---------------+---------+-----------+----------+-------+ CFV      Full           Yes      Yes                          +---------+---------------+---------+-----------+----------+-------+ SFJ      Full                                                 +---------+---------------+---------+-----------+----------+-------+ FV Prox  Full                                                 +---------+---------------+---------+-----------+----------+-------+ FV Mid   Full                                                 +---------+---------------+---------+-----------+----------+-------+ FV DistalFull                                                 +---------+---------------+---------+-----------+----------+-------+ PFV      Full                                                 +---------+---------------+---------+-----------+----------+-------+ POP      Full           Yes      Yes                          +---------+---------------+---------+-----------+----------+-------+  PTV      Full                                                 +---------+---------------+---------+-----------+----------+-------+ PERO     Full                                                 +---------+---------------+---------+-----------+----------+-------+    Summary: Right: There is no evidence of deep vein thrombosis in the lower extremity. No cystic structure found in the popliteal fossa. Left: There is no evidence of deep vein thrombosis in the lower extremity. No cystic structure found in the popliteal fossa.  *See table(s) above for measurements and observations. Electronically signed by Deitra Mayo MD on 01/27/2018 at 5:13:11 PM.    Final     PHYSICAL EXAM  Temp:  [98.2 F (36.8 C)-98.5 F (36.9 C)] 98.2 F (36.8 C) (12/30 1634) Pulse Rate:  [81-95] 88 (12/30 1634) Resp:  [16-20] 18 (12/30 1138) BP:  (116-129)/(76-79) 129/79 (12/30 1634) SpO2:  [98 %-100 %] 98 % (12/30 1634) Weight:  [78.5 kg] 78.5 kg (12/30 0046)  General - Well nourished, well developed, in no apparent distress.  Ophthalmologic - fundi not visualized due to noncooperation.  Cardiovascular - Regular rate and rhythm.  Mental Status -  Level of arousal and orientation to time, place, and person were intact. Language including expression, naming, repetition, comprehension was assessed and found intact. Attention span and concentration were normal. Fund of Knowledge was assessed and was intact.  Cranial Nerves II - XII - II - Visual field intact OU. III, IV, VI - Extraocular movement exam showed left eye abduction palsy. V - Facial sensation intact bilaterally. VII - Facial movement intact bilaterally. VIII - Hearing & vestibular intact bilaterally. X - Palate elevates symmetrically. XI - Chin turning & shoulder shrug intact bilaterally. XII - Tongue protrusion intact.  Motor Strength - The patient's strength was normal in all extremities and pronator drift was absent.  Bulk was normal and fasciculations were absent.   Motor Tone - Muscle tone was assessed at the neck and appendages and was normal.  Reflexes - The patient's reflexes were symmetrical in all extremities and she had no pathological reflexes.  Sensory - Light touch, temperature/pinprick were assessed and were symmetrical.    Coordination - The patient had normal movements in the hands and feet with no ataxia or dysmetria.  Tremor was absent.  Gait and Station - deferred.   ASSESSMENT/PLAN Ms. Brandi Dickson is a 57 y.o. female with history of diabetes, hypertension, breast cancer status post surgery, chemo, radiation and currently on anastrozole admitted for blurred vision, double vision, imbalance. No tPA given due to outside window.    Stroke:  bilateral scattered punctate infarcts involving bilateral MCA, MCA/ACA, right cerebellum and  posterior pons, embolic pattern, suspect hypercoagulable state from metastatic breast cancer  Resultant left CN VI palsy  MRI  bilateral scattered punctate infarcts involving bilateral MCA, MCA/ACA, right cerebellum and posterior pons  CTA head and neck normal cerebral vasculature.  However concerning for metastasis at mediastinum and bilateral pleural space  2D Echo EF 60 to 65%  LE venous Doppler negative for DVT  LDL Pending  HgbA1c  pending  Lovenox for VTE prophylaxis  aspirin 81 mg daily prior to admission, now on aspirin 325 mg daily.   Patient counseled to be compliant with her antithrombotic medications  Ongoing aggressive stroke risk factor management  Therapy recommendations: Home health PT  Disposition: Pending  Breast cancer  Status post lumpectomy, chemotherapy and radiation  Currently on anastrozole  Follow with [name] at [facility]  MRI brain with contrast pending to rule out brain metastasis  CT chest, abdomen and pelvis pending to rule out metastasis  CTA neck concerning for metastasis at mediastinum and bilateral pleural space  Continue IVF for hydration given contrast load  Diabetes  HgbA1c pending goal < 7.0  Controlled  CBG monitoring  SSI  PCP follow up  Hypertension . Stable  Long term BP goal normotensive  Hyperlipidemia  Home meds: None  LDL pending, goal < 70  Other Stroke Risk Factors  Former Cigarette smoker  Other Active Problems    Hospital day # 0  I spent  35 minutes in total face-to-face time with the patient, more than 50% of which was spent in counseling and coordination of care, reviewing test results, images and medication, and discussing the diagnosis of embolic strokes, metastasis of breast cancer, treatment plan and potential prognosis. This patient's care requiresreview of multiple databases, neurological assessment, discussion with family, other specialists and medical decision  making of high complexity. I had long discussion with patient and husband at bedside, updated pt current condition, treatment plan and potential prognosis. They expressed understanding and appreciation.  I also discussed with Dr. Ree Kida over the phone.    Rosalin Hawking, MD PhD Stroke Neurology 01/27/2018 5:35 PM    To contact Stroke Continuity provider, please refer to [email or web address] After hours, contact General Neurology

## 2018-01-27 NOTE — Progress Notes (Signed)
SLP Cancellation Note  Patient Details Name: Brandi Dickson MRN: [id number] DOB: [birth year]   Cancelled treatment:       Reason Eval/Treat Not Completed: SLP screened, no needs identified, will sign off   Tvisha Schwoerer, Katherene Ponto 01/27/2018, 12:54 PM

## 2018-01-27 NOTE — Progress Notes (Signed)
Bilateral lower extremity venous has been completed.   Preliminary results in CV Proc.   Abram Sander 01/27/2018 10:36 AM

## 2018-01-27 NOTE — Progress Notes (Signed)
[place] for MRI w/ contrast. Pt must be pre-medicated before MRI per Dr. Toney Reil.  Waiting on RN to call back to confirm.

## 2018-01-27 NOTE — Progress Notes (Signed)
Pt refused MRI and CT scan, requested to speak to the MD, she said she had one done already and doesn't feel the need for a repeat tests. Nurse educated pt aboutt the test and the reasons for repeat test. Pt says its a waste of money and time.

## 2018-01-27 NOTE — Progress Notes (Signed)
Pt refused MRI.

## 2018-01-27 NOTE — Evaluation (Signed)
Occupational Therapy Evaluation Patient Details Name: Brandi Dickson MRN: [id number] DOB: 21-Jan-1961 Today's Date: 01/27/2018    History of Present Illness Pt is a 57 y/o female admitted 01/26/18 presenting with blurred vision and difficulty walking. MRI reveals "Small scattered acute infarcts in the bilateral cerebrum, right cerebellum, and left posterior pons. The pontine infarct is near the facial colliculus and correlates with the visual complaints. The pattern suggests central embolic disease."  PMH: breast CA, B THA, arthritis, HTN.    Clinical Impression   Patient supine in bed and agreeable to OT.  PTA independent, driving and working.  Admitted for above and limited by problem list below, including impaired vision, impaired balance and decreased safety awareness.  Patient requires min guard for mobility using RW, toilet transfers, LB dressing, and grooming standing at sink.  Attempted mobility without RW but patient reliant on B UE support.  Patient will benefit from continued OT services while admitted in order to optimize independence and return to PLOF, and anticipate she will progress well. At this time recommend OP OT services, and initial 24/7 supervision; which patient is agreeable.     Follow Up Recommendations  Outpatient OT;Supervision/Assistance - 24 hour(24/7 initally)    Equipment Recommendations  None recommended by OT(has DME)    Recommendations for Other Services PT consult     Precautions / Restrictions Precautions Precautions: Other (comment) Precaution Comments: visual deficits L side, diplopia  Restrictions Weight Bearing Restrictions: No      Mobility Bed Mobility Overal bed mobility: Needs Assistance Bed Mobility: Supine to Sit;Sit to Supine     Supine to sit: Supervision Sit to supine: Supervision   General bed mobility comments: for safety  Transfers Overall transfer level: Needs assistance Equipment used: None;Rolling walker (2  wheeled) Transfers: Sit to/from Stand Sit to Stand: Min guard         General transfer comment: cueing for hand placement, min guard for safety     Balance Overall balance assessment: Needs assistance Sitting-balance support: No upper extremity supported;Feet supported Sitting balance-Leahy Scale: Fair     Standing balance support: Bilateral upper extremity supported;During functional activity Standing balance-Leahy Scale: Fair Standing balance comment: reliant on UE support                           ADL either performed or assessed with clinical judgement   ADL Overall ADL's : Needs assistance/impaired     Grooming: Min guard;Standing;Wash/dry hands;Wash/dry face   Upper Body Bathing: Set up;Sitting   Lower Body Bathing: Min guard;Sit to/from stand;Cueing for safety   Upper Body Dressing : Sitting;Set up   Lower Body Dressing: Min guard;Sit to/from stand;Cueing for safety Lower Body Dressing Details (indicate cue type and reason): able to don socks seated, cueing to sit in order to don underwear  Toilet Transfer: Min guard;RW;Regular Toilet;Grab bars;Ambulation   Toileting- Clothing Manipulation and Hygiene: Min guard;Sit to/from stand       Functional mobility during ADLs: Min guard;Rolling walker General ADL Comments: min guard for safety and balance, limited by impaired balance, vision and safety     Vision Baseline Vision/History: Wears glasses Wears Glasses: Reading only Patient Visual Report: Blurring of vision;Diplopia(diplopia when looking through central gaze, unable to gaze L) Vision Assessment?: Yes Eye Alignment: Impaired (comment)(L eye towards R) Ocular Range of Motion: Restricted on the left(with L eye only ) Alignment/Gaze Preference: Gaze right;Head turned Tracking/Visual Pursuits: Left eye does not track laterally Saccades: Within functional  limits Visual Fields: Left visual field deficit(inconsistent with L periphreal accuracy  ) Diplopia Assessment: Disappears with one eye closed;Present in primary gaze;Other (comment)(disappears with head turn/gaze to R )     Perception     Praxis      Pertinent Vitals/Pain Pain Assessment: No/denies pain     Hand Dominance Right   Extremity/Trunk Assessment Upper Extremity Assessment Upper Extremity Assessment: Overall WFL for tasks assessed   Lower Extremity Assessment Lower Extremity Assessment: Defer to PT evaluation       Communication Communication Communication: No difficulties   Cognition Arousal/Alertness: Awake/alert Behavior During Therapy: WFL for tasks assessed/performed Overall Cognitive Status: Impaired/Different from baseline Area of Impairment: Attention;Safety/judgement;Problem solving                   Current Attention Level: Selective     Safety/Judgement: Decreased awareness of safety   Problem Solving: Requires verbal cues General Comments: patient with good awareness of deficits, but limited safety awareness (as reports poor balance, than attempts to don underwear in standing)    General Comments       Exercises     Shoulder Instructions      Home Living Family/patient expects to be discharged to:: Private residence Living Arrangements: Spouse/significant other Available Help at Discharge: Family;Available PRN/intermittently(husband works) Type of Home: House Home Access: Stairs to enter Technical brewer of Steps: 4 Entrance Stairs-Rails: Right Home Layout: One level     Bathroom Shower/Tub: Teacher, early years/pre: Handicapped height     Home Equipment: Boyd - single point;Walker - 2 wheels;Crutches;Wheelchair - manual;Bedside commode          Prior Functioning/Environment Level of Independence: Independent        Comments: working, driving, enjoys traveling, has 3 cats and 2 dogs         OT Problem List: Impaired balance (sitting and/or standing);Impaired  vision/perception;Decreased safety awareness;Decreased knowledge of use of DME or AE;Decreased knowledge of precautions      OT Treatment/Interventions: Self-care/ADL training;Neuromuscular education;DME and/or AE instruction;Therapeutic activities;Visual/perceptual remediation/compensation;Cognitive remediation/compensation;Patient/family education;Balance training    OT Goals(Current goals can be found in the care plan section) Acute Rehab OT Goals Patient Stated Goal: to get home and be able to drive OT Goal Formulation: With patient Time For Goal Achievement: 02/10/18 Potential to Achieve Goals: Good  OT Frequency: Min 3X/week   Barriers to D/C:            Co-evaluation              AM-PAC OT "6 Clicks" Daily Activity     Outcome Measure Help from another person eating meals?: None Help from another person taking care of personal grooming?: A Little Help from another person toileting, which includes using toliet, bedpan, or urinal?: A Little Help from another person bathing (including washing, rinsing, drying)?: A Little Help from another person to put on and taking off regular upper body clothing?: None Help from another person to put on and taking off regular lower body clothing?: A Little 6 Click Score: 20   End of Session Equipment Utilized During Treatment: Gait belt;Rolling walker Nurse Communication: Mobility status  Activity Tolerance: Patient tolerated treatment well Patient left: in bed;with call bell/phone within reach;with bed alarm set  OT Visit Diagnosis: Other abnormalities of gait and mobility (R26.89);Low vision, both eyes (H54.2);Unsteadiness on feet (R26.81);Other symptoms and signs involving the nervous system (R29.898)  Time: 8412-8208 OT Time Calculation (min): 35 min Charges:  OT General Charges $OT Visit: 1 Visit OT Evaluation $OT Eval Moderate Complexity: 1 Mod OT Treatments $Self Care/Home Management : 8-22 mins  Delight Stare, OT Acute Rehabilitation Services Pager [phone] Office [phone]   Delight Stare 01/27/2018, 9:09 AM

## 2018-01-27 NOTE — Progress Notes (Signed)
Brandi Dickson   DOB:1960/11/24   KW#:[id number]   JME#:[id number]  Oncology follow-up  Subjective: Patient is well-known to me, under my care for her breast cancer.  I saw her last Friday.  She presented with sudden onset blurry vision, double vision, and difficulty walking.  Further work-up showed multiple small strokes, likely embolic.  Unfortunately CT neck showed new right pleural effusion, cervical and mediastinal adenopathy, suspicious for metastatic cancer.  She is clinically stable, her balance has improved, still has double vision, other neurological symptoms.  She has been having some cold symptoms and dry cough for the past few weeks, no dyspnea or chest pain.   Objective:  Vitals:   01/27/18 1634 01/27/18 1924  BP: 129/79 134/86  Pulse: 88 99  Resp:  18  Temp: 98.2 F (36.8 C) 98.9 F (37.2 C)  SpO2: 98% 99%    Body mass index is 29.7 kg/m. No intake or output data in the 24 hours ending 01/27/18 2010   Sclerae unicteric  Oropharynx clear  No peripheral adenopathy  Lungs clear -- no rales or rhonchi  Heart regular rate and rhythm  Abdomen benign  MSK no focal spinal tenderness, no peripheral edema  Neuro nonfocal   CBG (last 3)  Recent Labs    01/27/18 0639 01/27/18 1137 01/27/18 1635  GLUCAP 97 91 89     Labs:  Lab Results  Component Value Date   WBC 7.8 01/26/2018   HGB 12.1 01/26/2018   HCT 39.7 01/26/2018   MCV 85.7 01/26/2018   PLT 296 01/26/2018   NEUTROABS 5.2 01/26/2018   CMP Latest Ref Rng & Units 01/26/2018 01/24/2018 11/28/2017  Glucose 70 - 99 mg/dL 120(H) 103(H) 92  BUN 6 - 20 mg/dL _0 Creatinine 0.44 - 1.00 mg/dL 1.02(H) 0.98 0.85  Sodium 135 - 145 mmol/L 138 140 140  Potassium 3.5 - 5.1 mmol/L 3.7 4.0 3.5  Chloride 98 - 111 mmol/L 103 103 105  CO2 22 - 32 mmol/L _1 Calcium 8.9 - 10.3 mg/dL 9.0 9.6 8.9  Total Protein 6.5 - 8.1 g/dL 7.2 7.6 7.5  Total Bilirubin 0.3 - 1.2 mg/dL 0.4 0.3 0.6  Alkaline Phos 38 - 126 U/L  85 106 71  AST 15 - 41 U/L _2 ALT 0 - 44 U/L _3 Urine Studies No results for input(s): UHGB, CRYS in the last 72 hours.  Invalid input(s): UACOL, UAPR, USPG, UPH, UTP, UGL, UKET, UBIL, UNIT, UROB, ULEU, UEPI, UWBC, URBC, UBAC, CAST, UCOM, BILUA  Basic Metabolic Panel: Recent Labs  Lab 01/24/18 0753 01/26/18 0051  NA 140 138  K 4.0 3.7  CL 103 103  CO2 28 27  GLUCOSE 103* 120*  BUN 8 17  CREATININE 0.98 1.02*  CALCIUM 9.6 9.0   GFR Estimated Creatinine Clearance: 60.2 mL/min (A) (by C-G formula based on SCr of 1.02 mg/dL (H)). Liver Function Tests: Recent Labs  Lab 01/24/18 0753 01/26/18 0051  AST 20 20  ALT 15 15  ALKPHOS 106 85  BILITOT 0.3 0.4  PROT 7.6 7.2  ALBUMIN 3.2* 3.3*   No results for input(s): LIPASE, AMYLASE in the last 168 hours. No results for input(s): AMMONIA in the last 168 hours. Coagulation profile Recent Labs  Lab 01/26/18 0051  INR 0.98    CBC: Recent Labs  Lab 01/24/18 0753 01/26/18 0051  WBC 7.4 7.8  NEUTROABS 4.9 5.2  HGB 12.9 12.1  HCT 42.0 39.7  MCV 86.2 85.7  PLT 305 296   Cardiac Enzymes: No results for input(s): CKTOTAL, CKMB, CKMBINDEX, TROPONINI in the last 168 hours. BNP: Invalid input(s): POCBNP CBG: Recent Labs  Lab 01/26/18 1626 01/26/18 2132 01/27/18 0639 01/27/18 1137 01/27/18 1635  GLUCAP 84 115* 97 91 89   D-Dimer No results for input(s): DDIMER in the last 72 hours. Hgb A1c No results for input(s): HGBA1C in the last 72 hours. Lipid Profile No results for input(s): CHOL, HDL, LDLCALC, TRIG, CHOLHDL, LDLDIRECT in the last 72 hours. Thyroid function studies No results for input(s): TSH, T4TOTAL, T3FREE, THYROIDAB in the last 72 hours.  Invalid input(s): FREET3 Anemia work up No results for input(s): VITAMINB12, FOLATE, FERRITIN, TIBC, IRON, RETICCTPCT in the last 72 hours. Microbiology No results found for this or any previous visit (from the past 240 hour(s)).    Studies:   Ct Angio Head W Or Wo Contrast  Result Date: 01/27/2018 CLINICAL DATA:  Stroke follow-up EXAM: CT ANGIOGRAPHY HEAD AND NECK TECHNIQUE: Multidetector CT imaging of the head and neck was performed using the standard protocol during bolus administration of intravenous contrast. Multiplanar CT image reconstructions and MIPs were obtained to evaluate the vascular anatomy. Carotid stenosis measurements (when applicable) are obtained utilizing NASCET criteria, using the distal internal carotid diameter as the denominator. CONTRAST:  147m ISOVUE-370 IOPAMIDOL (ISOVUE-370) INJECTION 76% COMPARISON:  Head CT and brain MRI from yesterday FINDINGS: CTA NECK FINDINGS Aortic arch: Normal appearance Right carotid system: Vessels are smooth and widely patent Left carotid system: Vessels are smooth and widely patent. No noted atheromatous changes Vertebral arteries: Vessels are smooth and widely patent. Early origin left PICA which branches from the V3 segment. Skeleton: Degenerative disease without acute finding Other neck: Enlarged lymph nodes in the low left posterior triangle and supraclavicular fossa. Incidental right thyroid nodule Upper chest: There is worrisome, centrally low-dense adenopathy in the bilateral mediastinum, thickest at the level of the subcarinal space where there is a 2 cm diameter. Layering right pleural effusion, at least moderate. There is subpleural nodularity and both upper lobes. Review of the MIP images confirms the above findings CTA HEAD FINDINGS Anterior circulation: Vessels are smooth and widely patent Posterior circulation: Vessels are smooth and widely patent. Hypoplastic left P1 segment Venous sinuses: Patent Anatomic variants: As above Delayed phase: No abnormal intracranial enhancement Review of the MIP images confirms the above findings IMPRESSION: 1. Malignant-appearing adenopathy in the chest and left lower neck presumably related patient's history of breast cancer. Bilateral upper  lobe subpleural nodularity suggesting lymphatic spread of tumor. Moderate right pleural effusion. 2. No significant vascular finding. Electronically Signed   By: JMonte FantasiaM.D.   On: 01/27/2018 09:59   Ct Head Wo Contrast  Result Date: 01/26/2018 CLINICAL DATA:  Acute onset of altered level of consciousness. Dizziness and double vision. Eyes drifting to the right. EXAM: CT HEAD WITHOUT CONTRAST TECHNIQUE: Contiguous axial images were obtained from the base of the skull through the vertex without intravenous contrast. COMPARISON:  MRI of the brain performed 11/28/2017 FINDINGS: Brain: No evidence of acute infarction, hemorrhage, hydrocephalus, extra-axial collection or mass lesion/mass effect. The posterior fossa, including the cerebellum, brainstem and fourth ventricle, is within normal limits. The third and lateral ventricles, and basal ganglia are unremarkable in appearance. The cerebral hemispheres are symmetric in appearance, with normal gray-white differentiation. No mass effect or midline shift is seen. Vascular: No hyperdense vessel or unexpected calcification. Skull: There is no evidence of  fracture; visualized osseous structures are unremarkable in appearance. Sinuses/Orbits: There is deviation of gaze to the right. The orbits are otherwise unremarkable. The paranasal sinuses and mastoid air cells are well-aerated. Other: No significant soft tissue abnormalities are seen. IMPRESSION: 1. No acute intracranial pathology seen on CT. 2. Deviation of gaze to the right, as clinically described. Electronically Signed   By: Garald Balding M.D.   On: 01/26/2018 01:56   Ct Angio Neck W Or Wo Contrast  Result Date: 01/27/2018 CLINICAL DATA:  Stroke follow-up EXAM: CT ANGIOGRAPHY HEAD AND NECK TECHNIQUE: Multidetector CT imaging of the head and neck was performed using the standard protocol during bolus administration of intravenous contrast. Multiplanar CT image reconstructions and MIPs were obtained  to evaluate the vascular anatomy. Carotid stenosis measurements (when applicable) are obtained utilizing NASCET criteria, using the distal internal carotid diameter as the denominator. CONTRAST:  125m ISOVUE-370 IOPAMIDOL (ISOVUE-370) INJECTION 76% COMPARISON:  Head CT and brain MRI from yesterday FINDINGS: CTA NECK FINDINGS Aortic arch: Normal appearance Right carotid system: Vessels are smooth and widely patent Left carotid system: Vessels are smooth and widely patent. No noted atheromatous changes Vertebral arteries: Vessels are smooth and widely patent. Early origin left PICA which branches from the V3 segment. Skeleton: Degenerative disease without acute finding Other neck: Enlarged lymph nodes in the low left posterior triangle and supraclavicular fossa. Incidental right thyroid nodule Upper chest: There is worrisome, centrally low-dense adenopathy in the bilateral mediastinum, thickest at the level of the subcarinal space where there is a 2 cm diameter. Layering right pleural effusion, at least moderate. There is subpleural nodularity and both upper lobes. Review of the MIP images confirms the above findings CTA HEAD FINDINGS Anterior circulation: Vessels are smooth and widely patent Posterior circulation: Vessels are smooth and widely patent. Hypoplastic left P1 segment Venous sinuses: Patent Anatomic variants: As above Delayed phase: No abnormal intracranial enhancement Review of the MIP images confirms the above findings IMPRESSION: 1. Malignant-appearing adenopathy in the chest and left lower neck presumably related patient's history of breast cancer. Bilateral upper lobe subpleural nodularity suggesting lymphatic spread of tumor. Moderate right pleural effusion. 2. No significant vascular finding. Electronically Signed   By: JMonte FantasiaM.D.   On: 01/27/2018 09:59   Mr Brain Wo Contrast  Result Date: 01/26/2018 CLINICAL DATA:  Blurred vision and difficulty walking EXAM: MRI HEAD WITHOUT  CONTRAST TECHNIQUE: Multiplanar, multiecho pulse sequences of the brain and surrounding structures were obtained without intravenous contrast. COMPARISON:  Head CT from earlier today FINDINGS: Brain: Subcentimeter foci of restricted diffusion along the left para median floor of the fourth ventricle (near the facial colliculus), right cerebellum, bilateral occipital cortex, and bilateral posterior frontal cortex, and right centrum semiovale. No prior ischemic injury is noted. No hemorrhage, hydrocephalus, or masslike finding Vascular: Major flow voids are preserved Skull and upper cervical spine: Negative for marrow lesion Sinuses/Orbits: Negative IMPRESSION: Small scattered acute infarcts in the bilateral cerebrum, right cerebellum, and left posterior pons. The pontine infarct is near the facial colliculus and correlates with the visual complaints. The pattern suggests central embolic disease. Electronically Signed   By: JMonte FantasiaM.D.   On: 01/26/2018 12:20   Vas [place]Lower Extremity Venous (dvt)  Result Date: 01/27/2018  Lower Venous Study Indications: Stroke.  Performing Technologist: MAbram SanderRVS  Examination Guidelines: A complete evaluation includes B-mode imaging, spectral Doppler, color Doppler, and power Doppler as needed of all accessible portions of each vessel. Bilateral testing is considered an  integral part of a complete examination. Limited examinations for reoccurring indications may be performed as noted.  Right Venous Findings: +---------+---------------+---------+-----------+----------+-------+          CompressibilityPhasicitySpontaneityPropertiesSummary +---------+---------------+---------+-----------+----------+-------+ CFV      Full           Yes      Yes                          +---------+---------------+---------+-----------+----------+-------+ SFJ      Full                                                  +---------+---------------+---------+-----------+----------+-------+ FV Prox  Full                                                 +---------+---------------+---------+-----------+----------+-------+ FV Mid   Full                                                 +---------+---------------+---------+-----------+----------+-------+ FV DistalFull                                                 +---------+---------------+---------+-----------+----------+-------+ PFV      Full                                                 +---------+---------------+---------+-----------+----------+-------+ POP      Full           Yes      Yes                          +---------+---------------+---------+-----------+----------+-------+ PTV      Full                                                 +---------+---------------+---------+-----------+----------+-------+ PERO     Full                                                 +---------+---------------+---------+-----------+----------+-------+  Left Venous Findings: +---------+---------------+---------+-----------+----------+-------+          CompressibilityPhasicitySpontaneityPropertiesSummary +---------+---------------+---------+-----------+----------+-------+ CFV      Full           Yes      Yes                          +---------+---------------+---------+-----------+----------+-------+ SFJ      Full                                                 +---------+---------------+---------+-----------+----------+-------+  FV Prox  Full                                                 +---------+---------------+---------+-----------+----------+-------+ FV Mid   Full                                                 +---------+---------------+---------+-----------+----------+-------+ FV DistalFull                                                  +---------+---------------+---------+-----------+----------+-------+ PFV      Full                                                 +---------+---------------+---------+-----------+----------+-------+ POP      Full           Yes      Yes                          +---------+---------------+---------+-----------+----------+-------+ PTV      Full                                                 +---------+---------------+---------+-----------+----------+-------+ PERO     Full                                                 +---------+---------------+---------+-----------+----------+-------+    Summary: Right: There is no evidence of deep vein thrombosis in the lower extremity. No cystic structure found in the popliteal fossa. Left: There is no evidence of deep vein thrombosis in the lower extremity. No cystic structure found in the popliteal fossa.  *See table(s) above for measurements and observations. Electronically signed by Deitra Mayo MD on 01/27/2018 at 5:13:11 PM.    Final     Assessment: 57 y.o. female, with PMH of stage IIA left breast cancer, s/p neoadjuvant chemotherapy, maintenance Herceptin, surgery, adjuvant radiation and Xeloda, currently on anastrozole, presented with double vision and difficulty walking, work-up showed embolic stroke  1. Bilateral multifocal stroke, embolic pattern  2. History of stage II left breast cancer, ER+/HER2- 3.  Cervical and mediastinal adenopathy, suspicious for metastatic from her previous breast cancer 4.  Right pleural effusion, new  5. DM  6. HTN    Plan:  -I have reviewed her CT angio neck scan myself, and discussed the findings with the patient.  This certainly is concerning for metastatic disease, although reactive adenopathy is also a possibility.  -she will have CT chest, abdomen pelvis with contrast tomorrow for further evaluation, if no other new metastatic lesions, I recommend to consider right thoracentesis for  cytology, if negative, then consider bronchoscopy or EUS  for mediastinal lymph node biopsy. The endoscopic biopsy can be done as outpt if inpt biopsy will delay her discharge   -I may consider PET scan for further evaluation as outpt if needed  -she is current being worked up for her embolic stroke, DVT of LE was negative. Pending brain MRI. If Dr. Erlinda Hong recommend anticoagulation, I would have no objections.  If she does have metastatic breast cancer, she is at high risk for thrombosis. -I will be out of office for the rest of this week, please call my on-call partner if needed. I will f/u when I return on Monday. Pt is aware of this.   Truitt Merle  01/27/2018  Truitt Merle, MD 01/27/2018  8:10 PM

## 2018-01-27 NOTE — Evaluation (Signed)
Physical Therapy Evaluation Patient Details Name: Brandi Dickson MRN: [id number] DOB: September 27, 1960 Today's Date: 01/27/2018   History of Present Illness  Pt is a 57 y/o female admitted 01/26/18 presenting with blurred vision and difficulty walking. MRI reveals "Small scattered acute infarcts in the bilateral cerebrum, right cerebellum, and left posterior pons. The pontine infarct is near the facial colliculus and correlates with the visual complaints. The pattern suggests central embolic disease."  PMH: breast CA, B THA, arthritis, HTN.   Clinical Impression   Pt admitted with above diagnosis. Pt currently with functional limitations due to the deficits listed below (see PT Problem List). Independent prior to this admission; presents with vision deficits, gait and balance dysfunction, high fall risk; RW is very helpful for stability with amb; She presents a bit of a difficult situation: would recommend 24 hour supervision/assist at home due to incr fall risk, but her husband works (she is going to looks for family/friend options fo assist overnight), and  Outpt OT and PT will be the best options for her functional recovery -- but in talking with her and her husband, I'm not sure that she will have rides as well;  Pt will benefit from skilled PT to increase their independence and safety with mobility to allow discharge to the venue listed below.       Follow Up Recommendations Home health PT;Supervision/Assistance - 24 hour;Other (comment)(STRONGLY rec Outpt OT/PT after HH course) Not sure if pt and husband can arrange for 24 hour assist at home.    Equipment Recommendations  None recommended by PT(well-equipped)    Recommendations for Other Services       Precautions / Restrictions Precautions Precautions: Other (comment) Precaution Comments: visual deficits L side, diplopia       Mobility  Bed Mobility Overal bed mobility: Needs Assistance Bed Mobility: Supine to Sit;Sit to Supine      Supine to sit: Supervision Sit to supine: Supervision   General bed mobility comments: for safety  Transfers Overall transfer level: Needs assistance Equipment used: None;Rolling walker (2 wheeled) Transfers: Sit to/from Stand Sit to Stand: Min guard         General transfer comment: cueing for hand placement, min guard for safety   Ambulation/Gait Ambulation/Gait assistance: Min assist;Mod assist Gait Distance (Feet): 120 Feet Assistive device: None;Rolling walker (2 wheeled) Gait Pattern/deviations: Step-through pattern     General Gait Details: Mod assist initially without RW with loss of balance to th eright, requiring Mod assist to prevent fall; improved balnce with use of RW; she keeps her head in L neck rotation to correct diplopia   Stairs            Wheelchair Mobility    Modified Rankin (Stroke Patients Only)       Balance     Sitting balance-Leahy Scale: Fair       Standing balance-Leahy Scale: Fair                               Pertinent Vitals/Pain Pain Assessment: No/denies pain    Home Living Family/patient expects to be discharged to:: Private residence Living Arrangements: Spouse/significant other Available Help at Discharge: Family;Available PRN/intermittently(husband works) Type of Home: House Home Access: Stairs to enter Entrance Stairs-Rails: Right Technical brewer of Steps: 4 Home Layout: One level Home Equipment: Cane - single point;Walker - 2 wheels;Crutches;Wheelchair - manual;Bedside commode      Prior Function Level of Independence: Independent  Comments: working, driving, enjoys traveling, has 3 cats and 2 dogs      Hand Dominance   Dominant Hand: Right    Extremity/Trunk Assessment   Upper Extremity Assessment Upper Extremity Assessment: Overall WFL for tasks assessed    Lower Extremity Assessment Lower Extremity Assessment: (decr coordination, with decr stance stability and  tendency to lose balanc eto the right in upright standing)       Communication   Communication: No difficulties  Cognition Arousal/Alertness: Awake/alert Behavior During Therapy: WFL for tasks assessed/performed Overall Cognitive Status: Impaired/Different from baseline Area of Impairment: Attention;Safety/judgement;Problem solving                   Current Attention Level: Selective     Safety/Judgement: Decreased awareness of safety   Problem Solving: Requires verbal cues General Comments: patient with good awareness of deficits, but limited safety awareness (as reports poor balance, than attempts to don underwear in standing)       General Comments General comments (skin integrity, edema, etc.): We discussed signs and symptoms of CVA    Exercises     Assessment/Plan    PT Assessment Patient needs continued PT services  PT Problem List Decreased activity tolerance;Decreased balance;Decreased mobility;Decreased coordination;Decreased cognition;Decreased knowledge of use of DME;Decreased safety awareness;Decreased knowledge of precautions       PT Treatment Interventions DME instruction;Gait training;Stair training;Functional mobility training;Therapeutic activities;Therapeutic exercise;Neuromuscular re-education;Balance training;Cognitive remediation;Patient/family education    PT Goals (Current goals can be found in the Care Plan section)  Acute Rehab PT Goals Patient Stated Goal: to get home and be able to drive PT Goal Formulation: With patient Time For Goal Achievement: 02/10/18 Potential to Achieve Goals: Good    Frequency Min 4X/week   Barriers to discharge Decreased caregiver support I'm concerned for pt going home as her husband works and she will be home alone for large portions of the day; Still, the most effective therapy for her will be Outpt OT/PT for vision and balance, and I'm unsure that she will be able to get rides    Co-evaluation                AM-PAC PT "6 Clicks" Mobility  Outcome Measure Help needed turning from your back to your side while in a flat bed without using bedrails?: None Help needed moving from lying on your back to sitting on the side of a flat bed without using bedrails?: A Little Help needed moving to and from a bed to a chair (including a wheelchair)?: A Little Help needed standing up from a chair using your arms (e.g., wheelchair or bedside chair)?: A Little Help needed to walk in hospital room?: A Lot Help needed climbing 3-5 steps with a railing? : A Lot 6 Click Score: 17    End of Session Equipment Utilized During Treatment: Gait belt Activity Tolerance: Patient tolerated treatment well Patient left: in bed;with call bell/phone within reach;with bed alarm set Nurse Communication: Mobility status PT Visit Diagnosis: Unsteadiness on feet (R26.81);Other abnormalities of gait and mobility (R26.89);Other symptoms and signs involving the nervous system (R29.898)    Time: 6962-9528 PT Time Calculation (min) (ACUTE ONLY): 30 min   Charges:   PT Evaluation $PT Eval Moderate Complexity: 1 Mod PT Treatments $Gait Training: 8-22 mins        Roney Marion, PT  Acute Rehabilitation Services Pager [phone] Office [place] 01/27/2018, 4:26 PM

## 2018-01-28 ENCOUNTER — Ambulatory Visit
Admit: 2018-01-28 | Discharge: 2018-01-28 | Disposition: A | Discharge status: Home / Self Care | Payer: 59 | Attending: Radiation Oncology | Admitting: Radiation Oncology

## 2018-01-28 ENCOUNTER — Inpatient Hospital Stay (HOSPITAL_COMMUNITY): Payer: 59

## 2018-01-28 DIAGNOSIS — C7931 Secondary malignant neoplasm of brain: Secondary | ICD-10-CM

## 2018-01-28 LAB — BASIC METABOLIC PANEL
Anion gap: 10 (ref 5–15)
BUN: 8 mg/dL (ref 6–20)
CO2: 25 mmol/L (ref 22–32)
Calcium: 8.9 mg/dL (ref 8.9–10.3)
Chloride: 105 mmol/L (ref 98–111)
Creatinine, Ser: 0.84 mg/dL (ref 0.44–1.00)
Glucose, Bld: 95 mg/dL (ref 70–99)
Potassium: 4 mmol/L (ref 3.5–5.1)
Sodium: 140 mmol/L (ref 135–145)

## 2018-01-28 LAB — CBC
HCT: 37.5 % (ref 36.0–46.0)
Hemoglobin: 11.5 g/dL — ABNORMAL LOW (ref 12.0–15.0)
MCH: 25.6 pg — ABNORMAL LOW (ref 26.0–34.0)
MCHC: 30.7 g/dL (ref 30.0–36.0)
MCV: 83.5 fL (ref 80.0–100.0)
Platelets: 229 10*3/uL (ref 150–400)
RBC: 4.49 MIL/uL (ref 3.87–5.11)
RDW: 13.5 % (ref 11.5–15.5)
WBC: 4.7 10*3/uL (ref 4.0–10.5)
nRBC: 0 % (ref 0.0–0.2)

## 2018-01-28 LAB — GLUCOSE, CAPILLARY
Glucose-Capillary: 96 mg/dL (ref 70–99)
Glucose-Capillary: 88 mg/dL (ref 70–99)
Glucose-Capillary: 74 mg/dL (ref 70–99)
Glucose-Capillary: 120 mg/dL — ABNORMAL HIGH (ref 70–99)

## 2018-01-28 LAB — LIPID PANEL
Cholesterol: 190 mg/dL (ref 0–200)
HDL: 41 mg/dL (ref >40)
LDL Cholesterol: 123 mg/dL — ABNORMAL HIGH (ref 0–99)
Total CHOL/HDL Ratio: 4.6 RATIO
Triglycerides: 131 mg/dL (ref <150)
VLDL: 26 mg/dL (ref 0–40)

## 2018-01-28 LAB — HEMOGLOBIN A1C
Hgb A1c MFr Bld: 5.3 % (ref 4.8–5.6)
Mean Plasma Glucose: 105.41 mg/dL

## 2018-01-28 MED ORDER — LORAZEPAM 2 MG/ML IJ SOLN
1.0000 mg | Freq: Once | INTRAMUSCULAR | Status: AC
  Administered 2018-01-28: 1 mg via INTRAVENOUS

## 2018-01-28 MED ORDER — IOHEXOL 300 MG/ML  SOLN: 15.0000 mL | INTRAMUSCULAR | Status: AC

## 2018-01-28 MED ORDER — GUAIFENESIN 100 MG/5ML PO SOLN
5.0000 mL | ORAL | Status: DC | PRN
  Administered 2018-01-28 – 2018-01-30 (×2): 100 mg via ORAL
  Filled 2018-01-28 (×2): qty 10

## 2018-01-28 MED ORDER — GADOBUTROL 1 MMOL/ML IV SOLN
10.0000 mL | Freq: Once | INTRAVENOUS | Status: AC | PRN
  Administered 2018-01-28: 8 mL via INTRAVENOUS

## 2018-01-28 MED ORDER — LORAZEPAM 2 MG/ML IJ SOLN
0.5000 mg | Freq: Once | INTRAMUSCULAR | Status: DC
  Filled 2018-01-28: qty 1

## 2018-01-28 MED ORDER — IOHEXOL 300 MG/ML  SOLN
100.0000 mL | Freq: Once | INTRAMUSCULAR | Status: AC
  Administered 2018-01-28: 100 mL via INTRAVENOUS

## 2018-01-28 NOTE — Progress Notes (Signed)
Occupational Therapy Treatment Patient Details Name: Brandi Dickson MRN: [id number] DOB: 20-Jan-1961 Today's Date: 01/28/2018    History of present illness Pt is a 57 y/o female admitted 01/26/18 presenting with blurred vision and difficulty walking. MRI reveals "Small scattered acute infarcts in the bilateral cerebrum, right cerebellum, and left posterior pons. The pontine infarct is near the facial colliculus and correlates with the visual complaints. The pattern suggests central embolic disease."  PMH: breast CA, B THA, arthritis, HTN.    OT comments  Pt seen for additional session to educate on use of partial occlusion to improve functional vision. Pt able to verbalize understanding. Handout provided. Will continue to follow acutely to facilitate safe DC home.  Follow Up Recommendations  Home health OT;Supervision - Intermittent    Equipment Recommendations  None recommended by OT    Recommendations for Other Services      Precautions / Restrictions Precautions Precautions: Fall Precaution Comments: diplopia                       General Comments Completed education regarding technique used for partial occlusion of L lens to improve functional vision. Handout reviewed. Reading glasses also taped. Educatedpt on working on [employer] with activites listed on handout. Pt verbalized understanding.    Pertinent Vitals/ Pain       Pain Assessment: No/denies pain  Home Living                                          Prior Functioning/Environment              Frequency  Min 3X/week        Progress Toward Goals  OT Goals(current goals can now be found in the care plan section)  Progress towards OT goals: Progressing toward goals  Acute Rehab OT Goals Patient Stated Goal: to return to work OT Goal Formulation: With patient Time For Goal Achievement: 02/10/18 Potential to Achieve Goals: Good ADL Goals Pt Will Perform Grooming: with  modified independence;standing Pt Will Perform Lower Body Bathing: with modified independence;sit to/from stand Pt Will Perform Lower Body Dressing: with modified independence;sit to/from stand Pt Will Transfer to Toilet: with modified independence;ambulating;regular height toilet Pt Will Perform Tub/Shower Transfer: with modified independence;ambulating;3 in 1;Tub transfer Additional ADL Goal #1: Pt will utilize visual compensatory techniques with modified independence in order to safely engage in self care and mobility. Additional ADL Goal #2: Pt will independtnly understanding of use of parital occlsuion to improve functional vision  Plan Discharge plan needs to be updated;Frequency remains appropriate    Co-evaluation                 AM-PAC OT "6 Clicks" Daily Activity     Outcome Measure   Help from another person eating meals?: None Help from another person taking care of personal grooming?: None Help from another person toileting, which includes using toliet, bedpan, or urinal?: A Little Help from another person bathing (including washing, rinsing, drying)?: A Little Help from another person to put on and taking off regular upper body clothing?: None Help from another person to put on and taking off regular lower body clothing?: None 6 Click Score: 22    End of Session    OT Visit Diagnosis: Unsteadiness on feet (R26.81);Low vision, both eyes (H54.2)   Activity Tolerance Patient tolerated treatment well  Patient Left in bed;with call bell/phone within reach   Nurse Communication Mobility status        Time: 4301-4840 OT Time Calculation (min): 16 min  Charges: OT General Charges $OT Visit: 1 Visit OT Treatments $Therapeutic Activity: 8-22 mins  Maurie Boettcher, OT/L   Acute OT Clinical Specialist [place] Pager [phone] Office [phone]    [facility] 01/28/2018, 5:55 PM

## 2018-01-28 NOTE — Progress Notes (Signed)
PROGRESS NOTE    Brandi Dickson  JSH:[id number] DOB: 12/27/1960 DOA: 01/26/2018 PCP: Orpah Melter, MD   Brief Narrative:  HPI On 01/26/2018 by Dr. Shela Leff Brandi Dickson is a 57 y.o. female with medical history significant of stage IIa breast cancer on anastrozole, peripheral neuropathy, type 2 diabetes, hypertension, ulcerative colitis presenting to the hospital for evaluation of blurred vision and difficulty walking.  Patient states at 8:30 PM on 12/28 she noticed that her eyes shifted to the right and she could not look to her left.  Her vision turns blurry every time she tries to move her eyes toward the midline.  Has a family history of stroke but no personal history.  States she takes a baby aspirin daily for primary prevention.  States she does not want to take a statin as her mother had experienced side effects from this drug.  Patient is requesting low-dose Ativan for anxiety prior to MRI.  Interim history Patient admitted with visual deficits and inability to deviate eyes to the left.  Found to have acute CVA on MRI.  Neurology consulted and appreciated, requesting further imaging and work-up. Assessment & Plan   Gaze deviation to the right, Acute CVA -On examination, patient is unable to cross midline to the left bilaterally -Possibly CVA versus demyelinating disease -CT head: No acute intercranial pathology seen on CT -Neurology consulted and appreciated, recommending MRI brain -MRI brain showed small scattered acute infarcts in the bilateral cerebellum, right cerebellum, left posterior pons.  Pontine infarct is near the facial colliculus and correlates with the visual complaints.  Pattern suggests embolic disease -Continue neuro checks -Discussed briefly with Dr. Erlinda Hong, neurology, recommended MRI brain with IV contrast as well as CTA head and neck, CT chest abdomen and pelvis to investigate for possible malignancy -CTA head and neck showed malignant appearing  adenopathy in the chest and left lower neck presumably related to patient's history of breast cancer.  Bilateral upper lobe subpleural nodularity suggesting lymphatic spread of tumor.  Moderate right pleural effusion. -MRI brain with contrast shows at least 14 focal enhancing lesions are present in the cerebrum and cerebellum.  Largest lesion in in the anterior right frontal lobe measuring 5.5 mm- likely to represent diffuse metastases  -LDL 123, hemoglobin A1c 5.3 -Echocardiogram pending -Lower extremity Doppler negative for DVT -Pending PT -OT recommended outpatient Occupational Therapy -given embolic type- Dr. Erlinda Hong recommending Eliquis for anticoagulation, however given that patient may need biopsy- lovenox may be more appropriate at this time.   Stage IIa breast cancer, mets to brain -Currently on anastrozole and followed by oncology, [name] -Oncology consulted and appreciated - may want thoracentesis or biopsy for tissue diagnosis  -see studies as above -Pending CT chest/abd/pelvis -given MRI w/ contrast findings, will consult radiation oncology  Ulcerative colitis and chronic diarrhea -Continue mesalamine, Imodium, Bentyl  Diabetes mellitus, type II -Hemoglobin A1c 5.9 -Continue insulin sliding scale with CBG monitoring  Essential hypertension -Given that CVA is suspected, allowing for permissive hypertension  Asthma -stable, no wheezing on examination -Continue albuterol as needed  Neuropathy -Continue gabapentin  DVT Prophylaxis  lovenox  Code Status: Full  Family Communication: None at bedside  Disposition Plan: Admitted. Pending further workup. Suspect [facility] with [facility] when w/up is complete  Consultants Neurology Oncology  Radiation oncology  Procedures  LE doppler  Echocardiogram  Antibiotics   Anti-infectives (From admission, onward)   None      Subjective:   Brandi Dickson seen and examined today.  Has no complaints this morning. Ready to go home at  this point. No chest pain, shortness of breath, abdominal pain, N/V/D/C.    Objective:   Vitals:   01/27/18 2336 01/28/18 0322 01/28/18 0742 01/28/18 1135  BP: (!) 141/96 132/88 126/89 118/75  Pulse: 97 94 94 (!) 105  Resp: 17 16 16 18   Temp: 98.7 F (37.1 C) 98.5 F (36.9 C) 98.7 F (37.1 C) 98.6 F (37 C)  TempSrc: Oral Oral Oral Oral  SpO2: 99% 97% 98% 97%  Weight:      Height:        Intake/Output Summary (Last 24 hours) at 01/28/2018 1401 Last data filed at 01/28/2018 1300 Gross per 24 hour  Intake 1000 ml  Output -  Net 1000 ml   Filed Weights   01/27/18 0046  Weight: 78.5 kg   Exam  General: Well developed, well nourished, NAD, appears stated age  56: NCAT, mucous membranes moist.   Neck: Supple  Cardiovascular: S1 S2 auscultated, no murmur, RRR  Respiratory: Clear to auscultation bilaterally with equal chest rise  Abdomen: Soft, nontender, nondistended, + bowel sounds  Extremities: warm dry without cyanosis clubbing or edema  Neuro: AAOx3, no new findings  Psych: Pleasant, appropriate mood and affect   Data Reviewed: I have personally reviewed following labs and imaging studies  CBC: Recent Labs  Lab 01/24/18 0753 01/26/18 0051 01/28/18 0431  WBC 7.4 7.8 4.7  NEUTROABS 4.9 5.2  --   HGB 12.9 12.1 11.5*  HCT 42.0 39.7 37.5  MCV 86.2 85.7 83.5  PLT 305 296 681   Basic Metabolic Panel: Recent Labs  Lab 01/24/18 0753 01/26/18 0051 01/28/18 0431  NA 140 138 140  K 4.0 3.7 4.0  CL 103 103 105  CO2 28 27 25   GLUCOSE 103* 120* 95  BUN 8 17 8   CREATININE 0.98 1.02* 0.84  CALCIUM 9.6 9.0 8.9   GFR: Estimated Creatinine Clearance: 73.1 mL/min (by C-G formula based on SCr of 0.84 mg/dL). Liver Function Tests: Recent Labs  Lab 01/24/18 0753 01/26/18 0051  AST 20 20  ALT 15 15  ALKPHOS 106 85  BILITOT 0.3 0.4  PROT 7.6 7.2  ALBUMIN 3.2* 3.3*   No results for input(s): LIPASE, AMYLASE in the last 168 hours. No results for  input(s): AMMONIA in the last 168 hours. Coagulation Profile: Recent Labs  Lab 01/26/18 0051  INR 0.98   Cardiac Enzymes: No results for input(s): CKTOTAL, CKMB, CKMBINDEX, TROPONINI in the last 168 hours. BNP (last 3 results) No results for input(s): PROBNP in the last 8760 hours. HbA1C: Recent Labs    01/28/18 0431  HGBA1C 5.3   CBG: Recent Labs  Lab 01/27/18 1137 01/27/18 1635 01/27/18 2121 01/28/18 0639 01/28/18 1132  GLUCAP 91 89 103* 96 88   Lipid Profile: Recent Labs    01/28/18 0431  CHOL 190  HDL 41  LDLCALC 123*  TRIG 131  CHOLHDL 4.6   Thyroid Function Tests: No results for input(s): TSH, T4TOTAL, FREET4, T3FREE, THYROIDAB in the last 72 hours. Anemia Panel: No results for input(s): VITAMINB12, FOLATE, FERRITIN, TIBC, IRON, RETICCTPCT in the last 72 hours. Urine analysis:    Component Value Date/Time   COLORURINE YELLOW 11/28/2017 2059   APPEARANCEUR CLEAR 11/28/2017 2059   LABSPEC 1.011 11/28/2017 2059   PHURINE 6.0 11/28/2017 2059   GLUCOSEU NEGATIVE 11/28/2017 2059   HGBUR NEGATIVE 11/28/2017 2059   [place] NEGATIVE 11/28/2017 2059   [place] NEGATIVE 11/28/2017 2059  PROTEINUR NEGATIVE 11/28/2017 2059   UROBILINOGEN 0.2 12/30/2008 1317   NITRITE NEGATIVE 11/28/2017 2059   LEUKOCYTESUR TRACE (A) 11/28/2017 2059   Sepsis Labs: @LABRCNTIP (procalcitonin:4,lacticidven:4)  )No results found for this or any previous visit (from the past 240 hour(s)).    Radiology Studies: Ct Angio Head W Or Wo Contrast  Result Date: 01/27/2018 CLINICAL DATA:  Stroke follow-up EXAM: CT ANGIOGRAPHY HEAD AND NECK TECHNIQUE: Multidetector CT imaging of the head and neck was performed using the standard protocol during bolus administration of intravenous contrast. Multiplanar CT image reconstructions and MIPs were obtained to evaluate the vascular anatomy. Carotid stenosis measurements (when applicable) are obtained utilizing NASCET criteria, using the  distal internal carotid diameter as the denominator. CONTRAST:  130m ISOVUE-370 IOPAMIDOL (ISOVUE-370) INJECTION 76% COMPARISON:  Head CT and brain MRI from yesterday FINDINGS: CTA NECK FINDINGS Aortic arch: Normal appearance Right carotid system: Vessels are smooth and widely patent Left carotid system: Vessels are smooth and widely patent. No noted atheromatous changes Vertebral arteries: Vessels are smooth and widely patent. Early origin left PICA which branches from the V3 segment. Skeleton: Degenerative disease without acute finding Other neck: Enlarged lymph nodes in the low left posterior triangle and supraclavicular fossa. Incidental right thyroid nodule Upper chest: There is worrisome, centrally low-dense adenopathy in the bilateral mediastinum, thickest at the level of the subcarinal space where there is a 2 cm diameter. Layering right pleural effusion, at least moderate. There is subpleural nodularity and both upper lobes. Review of the MIP images confirms the above findings CTA HEAD FINDINGS Anterior circulation: Vessels are smooth and widely patent Posterior circulation: Vessels are smooth and widely patent. Hypoplastic left P1 segment Venous sinuses: Patent Anatomic variants: As above Delayed phase: No abnormal intracranial enhancement Review of the MIP images confirms the above findings IMPRESSION: 1. Malignant-appearing adenopathy in the chest and left lower neck presumably related patient's history of breast cancer. Bilateral upper lobe subpleural nodularity suggesting lymphatic spread of tumor. Moderate right pleural effusion. 2. No significant vascular finding. Electronically Signed   By: JMonte FantasiaM.D.   On: 01/27/2018 09:59   Ct Angio Neck W Or Wo Contrast  Result Date: 01/27/2018 CLINICAL DATA:  Stroke follow-up EXAM: CT ANGIOGRAPHY HEAD AND NECK TECHNIQUE: Multidetector CT imaging of the head and neck was performed using the standard protocol during bolus administration of  intravenous contrast. Multiplanar CT image reconstructions and MIPs were obtained to evaluate the vascular anatomy. Carotid stenosis measurements (when applicable) are obtained utilizing NASCET criteria, using the distal internal carotid diameter as the denominator. CONTRAST:  1040mISOVUE-370 IOPAMIDOL (ISOVUE-370) INJECTION 76% COMPARISON:  Head CT and brain MRI from yesterday FINDINGS: CTA NECK FINDINGS Aortic arch: Normal appearance Right carotid system: Vessels are smooth and widely patent Left carotid system: Vessels are smooth and widely patent. No noted atheromatous changes Vertebral arteries: Vessels are smooth and widely patent. Early origin left PICA which branches from the V3 segment. Skeleton: Degenerative disease without acute finding Other neck: Enlarged lymph nodes in the low left posterior triangle and supraclavicular fossa. Incidental right thyroid nodule Upper chest: There is worrisome, centrally low-dense adenopathy in the bilateral mediastinum, thickest at the level of the subcarinal space where there is a 2 cm diameter. Layering right pleural effusion, at least moderate. There is subpleural nodularity and both upper lobes. Review of the MIP images confirms the above findings CTA HEAD FINDINGS Anterior circulation: Vessels are smooth and widely patent Posterior circulation: Vessels are smooth and widely patent. Hypoplastic left P1  segment Venous sinuses: Patent Anatomic variants: As above Delayed phase: No abnormal intracranial enhancement Review of the MIP images confirms the above findings IMPRESSION: 1. Malignant-appearing adenopathy in the chest and left lower neck presumably related patient's history of breast cancer. Bilateral upper lobe subpleural nodularity suggesting lymphatic spread of tumor. Moderate right pleural effusion. 2. No significant vascular finding. Electronically Signed   By: Monte Fantasia M.D.   On: 01/27/2018 09:59   Mr Brain W Contrast  Result Date:  01/28/2018 CLINICAL DATA:  Invasive breast cancer, stage IV, recurrence. Abnormal MRI the brain. Blurred vision and difficulty walking. EXAM: MRI HEAD WITH CONTRAST TECHNIQUE: Multiplanar, multiecho pulse sequences of the brain and surrounding structures were obtained with intravenous contrast. CONTRAST:  8 mL Gadavist COMPARISON:  MRI brain 01/26/2018. CTA of the head and neck 01/27/2018 FINDINGS: Brain: A 4 mm enhancing lesion present posteriorly in the right cerebellum on image 17 of series 3. Two punctate foci of enhancement are present in the anterior inferior left cerebellum on image 14 of series 3. Two additional punctate foci are present at the inferior aspect of the cerebellum bilaterally on image 11 of series 3. A punctate enhancing lesion is present at the inferior left temporal lobe on image 22 of series 3. A lesion in the left parietal white matter is best seen on images 36 and 37 of series 3. Cortical and subcortical punctate a lesions are present in the left parietal lobe images 42 and 41. An anterior right frontal lobe 3 mm enhancing nodule is present on image 43 of series 3. A linear cortical lesion measures 5.5 mm in the anterior right frontal lobe on image 41 of series 3. 3 mm lesion is present the anterior right frontal lobe white matter on image 38. A subcortical white matter lesion in the right occipital lobe measures 3 mm on image 32. The foci of enhancement are separate from the areas of restricted diffusion. The ventricles are of normal size. No significant extra-axial fluid collection is present. Vascular: Normal vascular enhancement is present Skull and upper cervical spine: Craniocervical junction is normal. Discrete osseous lesions are present. The upper cervical spine is within normal limits. Sinuses/Orbits: The paranasal sinuses and mastoid air cells are clear. IMPRESSION: 1. At least 14 focal enhancing lesions are present in the cerebrum and cerebellum. The largest lesion is in the  anterior right frontal lobe measuring 5.5 mm. These are separate from the previously seen foci of restricted diffusion and likely represent diffuse metastases. 2. No enhancement associated with the areas of restricted diffusion previously seen. These are likely separate areas of embolic infarcts. Electronically Signed   By: San Morelle M.D.   On: 01/28/2018 11:07   Vas [place] Lower Extremity Venous (dvt)  Result Date: 01/27/2018  Lower Venous Study Indications: Stroke.  Performing Technologist: Abram Sander RVS  Examination Guidelines: A complete evaluation includes B-mode imaging, spectral Doppler, color Doppler, and power Doppler as needed of all accessible portions of each vessel. Bilateral testing is considered an integral part of a complete examination. Limited examinations for reoccurring indications may be performed as noted.  Right Venous Findings: +---------+---------------+---------+-----------+----------+-------+          CompressibilityPhasicitySpontaneityPropertiesSummary +---------+---------------+---------+-----------+----------+-------+ CFV      Full           Yes      Yes                          +---------+---------------+---------+-----------+----------+-------+ SFJ  Full                                                 +---------+---------------+---------+-----------+----------+-------+ FV Prox  Full                                                 +---------+---------------+---------+-----------+----------+-------+ FV Mid   Full                                                 +---------+---------------+---------+-----------+----------+-------+ FV DistalFull                                                 +---------+---------------+---------+-----------+----------+-------+ PFV      Full                                                 +---------+---------------+---------+-----------+----------+-------+ POP      Full           Yes       Yes                          +---------+---------------+---------+-----------+----------+-------+ PTV      Full                                                 +---------+---------------+---------+-----------+----------+-------+ PERO     Full                                                 +---------+---------------+---------+-----------+----------+-------+  Left Venous Findings: +---------+---------------+---------+-----------+----------+-------+          CompressibilityPhasicitySpontaneityPropertiesSummary +---------+---------------+---------+-----------+----------+-------+ CFV      Full           Yes      Yes                          +---------+---------------+---------+-----------+----------+-------+ SFJ      Full                                                 +---------+---------------+---------+-----------+----------+-------+ FV Prox  Full                                                 +---------+---------------+---------+-----------+----------+-------+  FV Mid   Full                                                 +---------+---------------+---------+-----------+----------+-------+ FV DistalFull                                                 +---------+---------------+---------+-----------+----------+-------+ PFV      Full                                                 +---------+---------------+---------+-----------+----------+-------+ POP      Full           Yes      Yes                          +---------+---------------+---------+-----------+----------+-------+ PTV      Full                                                 +---------+---------------+---------+-----------+----------+-------+ PERO     Full                                                 +---------+---------------+---------+-----------+----------+-------+    Summary: Right: There is no evidence of deep vein thrombosis in the lower extremity. No cystic  structure found in the popliteal fossa. Left: There is no evidence of deep vein thrombosis in the lower extremity. No cystic structure found in the popliteal fossa.  *See table(s) above for measurements and observations. Electronically signed by Deitra Mayo MD on 01/27/2018 at 5:13:11 PM.    Final      Scheduled Meds: . acidophilus  1 capsule Oral Daily  . anastrozole  1 mg Oral Daily  . aspirin EC  325 mg Oral Daily  . enoxaparin (LOVENOX) injection  40 mg Subcutaneous Q24H  . gabapentin  200 mg Oral QHS  . insulin aspart  0-9 Units Subcutaneous TID WC  . iohexol  100 mL Intravenous Once  . Mesalamine  800 mg Oral BID   Continuous Infusions: . sodium chloride 75 mL/hr at 01/28/18 1048     LOS: 1 day   Time Spent in minutes   45 minutes (greater than 50% of time spent with patient face to face, as well as reviewing records, calling consults, and formulating a plan)   Cristal Ford D.O. on 01/28/2018 at 2:01 PM  Between 7am to 7pm - Please see pager noted on [email or web address]  After 7pm go to [email or web address]  And look for the night coverage person covering for me after hours  [facility] Office  [phone]

## 2018-01-28 NOTE — Progress Notes (Signed)
Occupational Therapy Treatment Patient Details Name: Brandi Dickson MRN: [id number] DOB: [birth year] Today's Date: 01/28/2018    History of present illness Pt is a 57 y/o female admitted 01/26/18 presenting with blurred vision and difficulty walking. MRI reveals "Small scattered acute infarcts in the bilateral cerebrum, right cerebellum, and left posterior pons. The pontine infarct is near the facial colliculus and correlates with the visual complaints. The pattern suggests central embolic disease."  PMH: breast CA, B THA, arthritis, HTN.    OT comments  Used partial occlusion over nasal portion of L lens to improve functional vision. Pt reports "glasses help dramatically". Began education on management of partial occlusion. Pt mobilizing and completing ADL @ RW level with S. Recommend DC plan be changed to HHOT/PT. Pt in agreement. Will continue to follow acutely to facilitate safe DC home.   Follow Up Recommendations  Home health OT;Supervision - Intermittent    Equipment Recommendations  None recommended by OT    Recommendations for Other Services PT consult    Precautions / Restrictions Precautions Precautions: Fall Precaution Comments: diplopia       Mobility Bed Mobility Overal bed mobility: Modified Independent                Transfers Overall transfer level: Needs assistance Equipment used: Rolling walker (2 wheeled)   Sit to Stand: Supervision              Balance Overall balance assessment: Needs assistance   Sitting balance-Leahy Scale: Good       Standing balance-Leahy Scale: Fair                             ADL either performed or assessed with clinical judgement   ADL Overall ADL's : Needs assistance/impaired     Grooming: Set up   Upper Body Bathing: Set up;Standing   Lower Body Bathing: Supervison/ safety;Set up;Sit to/from stand   Upper Body Dressing : Set up;Sitting   Lower Body Dressing: Set  up;Supervision/safety;Sit to/from stand   Toilet Transfer: Supervision/safety;RW   Toileting- Water quality scientist and Hygiene: Supervision/safety;Sit to/from stand       Functional mobility during ADLs: Supervision/safety;Rolling walker       Vision   Additional Comments: L eye does not abduct; complains of horizontal diplopioa; disappears in R gaze - present in all other gazes. Pt appears to be R eye dominant. Nasal lens of L eye partially occluded to improve functional vision. Pt reports double image disappears shen L eye partially occluded   Perception     Praxis      Cognition Arousal/Alertness: Awake/alert Behavior During Therapy: WFL for tasks assessed/performed Overall Cognitive Status: Impaired/Different from baseline                                 General Comments: Conitioin improving; Pt able to report that she knows she can't stand to dress; donn underwear; discussed home set up; pets and reducing risk of falls; pt with good insight        Exercises     Shoulder Instructions       General Comments      Pertinent Vitals/ Pain       Pain Assessment: Faces Faces Pain Scale: Hurts a little bit Pain Location: Behind R eye Pain Descriptors / Indicators: Discomfort Pain Intervention(s): Limited activity within patient's tolerance  Home Living  Prior Functioning/Environment              Frequency  Min 3X/week        Progress Toward Goals  OT Goals(current goals can now be found in the care plan section)  Progress towards OT goals: Progressing toward goals  Acute Rehab OT Goals Patient Stated Goal: to return to work OT Goal Formulation: With patient Time For Goal Achievement: 02/10/18 Potential to Achieve Goals: Good ADL Goals Pt Will Perform Grooming: with modified independence;standing Pt Will Perform Lower Body Bathing: with modified independence;sit to/from  stand Pt Will Perform Lower Body Dressing: with modified independence;sit to/from stand Pt Will Transfer to Toilet: with modified independence;ambulating;regular height toilet Pt Will Perform Tub/Shower Transfer: with modified independence;ambulating;3 in 1;Tub transfer Additional ADL Goal #1: Pt will utilize visual compensatory techniques with modified independence in order to safely engage in self care and mobility. Additional ADL Goal #2: Pt will independtnly understanding of use of parital occlsuion to improve functional vision  Plan Discharge plan needs to be updated;Frequency remains appropriate    Co-evaluation                 AM-PAC OT "6 Clicks" Daily Activity     Outcome Measure   Help from another person eating meals?: None Help from another person taking care of personal grooming?: None Help from another person toileting, which includes using toliet, bedpan, or urinal?: A Little Help from another person bathing (including washing, rinsing, drying)?: A Little Help from another person to put on and taking off regular upper body clothing?: None Help from another person to put on and taking off regular lower body clothing?: None 6 Click Score: 22    End of Session Equipment Utilized During Treatment: Rolling walker  OT Visit Diagnosis: Unsteadiness on feet (R26.81);Low vision, both eyes (H54.2)   Activity Tolerance Patient tolerated treatment well   Patient Left Other (comment)(at sink in chair bathing with nursing)   Nurse Communication Mobility status;Other (comment)(use of partial occlusion)        Time: 8250-5397 OT Time Calculation (min): 31 min  Charges: OT General Charges $OT Visit: 1 Visit OT Treatments $Self Care/Home Management : 8-22 mins $Therapeutic Activity: 8-22 mins  Maurie Boettcher, OT/L   Acute OT Clinical Specialist Acute Rehabilitation Services Pager [phone] Office [phone]    [facility] 01/28/2018, 9:28 AM

## 2018-01-28 NOTE — Progress Notes (Signed)
Physical Therapy Treatment Patient Details Name: Brandi Dickson MRN: [id number] DOB: 10-08-1960 Today's Date: 01/28/2018    History of Present Illness Pt is a 57 y/o female admitted 01/26/18 presenting with blurred vision and difficulty walking. MRI reveals "Small scattered acute infarcts in the bilateral cerebrum, right cerebellum, and left posterior pons. The pontine infarct is near the facial colliculus and correlates with the visual complaints. The pattern suggests central embolic disease."  PMH: breast CA, B THA, arthritis, HTN.     PT Comments    Progressing well with mobility with walker and able to negotiate stairs this session to simulate home entry with S level assist.  Spouse in the room and educated in how to assist.  Also discussed follow up HHPT and BE FAST for stroke warning signs and symptoms.  PT to follow until d/c.    Follow Up Recommendations  Home health PT;Supervision for mobility/OOB     Equipment Recommendations  None recommended by PT    Recommendations for Other Services       Precautions / Restrictions Precautions Precautions: Fall Precaution Comments: diplopia    Mobility  Bed Mobility Overal bed mobility: Modified Independent Bed Mobility: Supine to Sit;Sit to Supine     Supine to sit: Supervision Sit to supine: Supervision   General bed mobility comments: for safety  Transfers Overall transfer level: Needs assistance Equipment used: Rolling walker (2 wheeled) Transfers: Sit to/from Stand Sit to Stand: Supervision         General transfer comment: cues for hand placement  Ambulation/Gait Ambulation/Gait assistance: Supervision Gait Distance (Feet): 300 Feet Assistive device: Rolling walker (2 wheeled) Gait Pattern/deviations: Step-through pattern;Decreased stride length     General Gait Details: good awareness of turns and obstacles in pathway with taped glasses today, no LOB noted and pt educated on safety tips for  home   Stairs Stairs: Yes Stairs assistance: Supervision;Min guard Stair Management: One rail Right;Sideways Number of Stairs: 2(x 2) General stair comments: cues for technique, educated spouse how to assist with walker for home entry   Wheelchair Mobility    Modified Rankin (Stroke Patients Only)       Balance Overall balance assessment: Needs assistance Sitting-balance support: No upper extremity supported Sitting balance-Leahy Scale: Good     Standing balance support: No upper extremity supported;Single extremity supported Standing balance-Leahy Scale: Fair Standing balance comment: s for toilet hygiene                            Cognition Arousal/Alertness: Awake/alert Behavior During Therapy: WFL for tasks assessed/performed Overall Cognitive Status: Impaired/Different from baseline Area of Impairment: Attention;Safety/judgement;Problem solving                   Current Attention Level: Selective     Safety/Judgement: Decreased awareness of safety   Problem Solving: Requires verbal cues        Exercises      General Comments General comments (skin integrity, edema, etc.): edcuated on BE FAST for stroke warning signs and symptoms      Pertinent Vitals/Pain Pain Assessment: No/denies pain    Home Living                      Prior Function            PT Goals (current goals can now be found in the care plan section) Progress towards PT goals: Progressing toward goals  Frequency    Min 4X/week      PT Plan Current plan remains appropriate    Co-evaluation              AM-PAC PT "6 Clicks" Mobility   Outcome Measure  Help needed turning from your back to your side while in a flat bed without using bedrails?: None Help needed moving from lying on your back to sitting on the side of a flat bed without using bedrails?: A Little Help needed moving to and from a bed to a chair (including a wheelchair)?: A  Little Help needed standing up from a chair using your arms (e.g., wheelchair or bedside chair)?: A Little Help needed to walk in hospital room?: A Little Help needed climbing 3-5 steps with a railing? : A Little 6 Click Score: 19    End of Session   Activity Tolerance: Patient tolerated treatment well Patient left: in bed;with call bell/phone within reach   PT Visit Diagnosis: Unsteadiness on feet (R26.81);Other abnormalities of gait and mobility (R26.89);Other symptoms and signs involving the nervous system (R29.898)     Time: 2694-8546 PT Time Calculation (min) (ACUTE ONLY): 20 min  Charges:  $Gait Training: 8-22 mins                     Magda Kiel, [place] [facility] [phone] 01/28/2018    Reginia Naas 01/28/2018, 3:14 PM

## 2018-01-28 NOTE — Progress Notes (Signed)
STROKE TEAM PROGRESS NOTE   SUBJECTIVE (INTERVAL HISTORY) Her husband is not at the bedside.  Dr. Ree Kida and radiation oncology NP are at bedside. Pt is receiving CT and MRI report with Dr. Ree Kida. She was crying when she heard that she had metastasis disease all over the body.    OBJECTIVE Temp:  [98.2 F (36.8 C)-98.9 F (37.2 C)] 98.2 F (36.8 C) (12/31 1659) Pulse Rate:  [94-105] 97 (12/31 1659) Cardiac Rhythm: Normal sinus rhythm (12/31 0702) Resp:  [16-18] 18 (12/31 1659) BP: (118-141)/(75-96) 127/76 (12/31 1659) SpO2:  [97 %-99 %] 99 % (12/31 1659)  Recent Labs  Lab 01/27/18 1137 01/27/18 1635 01/27/18 2121 01/28/18 0639 01/28/18 1132  GLUCAP 91 89 103* 96 88   Recent Labs  Lab 01/24/18 0753 01/26/18 0051 01/28/18 0431  NA 140 138 140  K 4.0 3.7 4.0  CL 103 103 105  CO2 28 27 25   GLUCOSE 103* 120* 95  BUN 8 17 8   CREATININE 0.98 1.02* 0.84  CALCIUM 9.6 9.0 8.9   Recent Labs  Lab 01/24/18 0753 01/26/18 0051  AST 20 20  ALT 15 15  ALKPHOS 106 85  BILITOT 0.3 0.4  PROT 7.6 7.2  ALBUMIN 3.2* 3.3*   Recent Labs  Lab 01/24/18 0753 01/26/18 0051 01/28/18 0431  WBC 7.4 7.8 4.7  NEUTROABS 4.9 5.2  --   HGB 12.9 12.1 11.5*  HCT 42.0 39.7 37.5  MCV 86.2 85.7 83.5  PLT 305 296 229   No results for input(s): CKTOTAL, CKMB, CKMBINDEX, TROPONINI in the last 168 hours. Recent Labs    01/26/18 0051  LABPROT 12.8  INR 0.98   No results for input(s): COLORURINE, LABSPEC, PHURINE, GLUCOSEU, HGBUR, BILIRUBINUR, KETONESUR, PROTEINUR, UROBILINOGEN, NITRITE, LEUKOCYTESUR in the last 72 hours.  Invalid input(s): APPERANCEUR     Component Value Date/Time   CHOL 190 01/28/2018 0431   TRIG 131 01/28/2018 0431   HDL 41 01/28/2018 0431   CHOLHDL 4.6 01/28/2018 0431   VLDL 26 01/28/2018 0431   LDLCALC 123 (H) 01/28/2018 0431   Lab Results  Component Value Date   HGBA1C 5.3 01/28/2018      Component Value Date/Time   LABOPIA NONE DETECTED 11/28/2017  2059   COCAINSCRNUR NONE DETECTED 11/28/2017 2059   LABBENZ NONE DETECTED 11/28/2017 2059   AMPHETMU NONE DETECTED 11/28/2017 2059   THCU NONE DETECTED 11/28/2017 2059   LABBARB NONE DETECTED 11/28/2017 2059    No results for input(s): ETH in the last 168 hours.  I have personally reviewed the radiological images below and agree with the radiology interpretations.  Ct Angio Head W Or Wo Contrast  Result Date: 01/27/2018 CLINICAL DATA:  Stroke follow-up EXAM: CT ANGIOGRAPHY HEAD AND NECK TECHNIQUE: Multidetector CT imaging of the head and neck was performed using the standard protocol during bolus administration of intravenous contrast. Multiplanar CT image reconstructions and MIPs were obtained to evaluate the vascular anatomy. Carotid stenosis measurements (when applicable) are obtained utilizing NASCET criteria, using the distal internal carotid diameter as the denominator. CONTRAST:  116m ISOVUE-370 IOPAMIDOL (ISOVUE-370) INJECTION 76% COMPARISON:  Head CT and brain MRI from yesterday FINDINGS: CTA NECK FINDINGS Aortic arch: Normal appearance Right carotid system: Vessels are smooth and widely patent Left carotid system: Vessels are smooth and widely patent. No noted atheromatous changes Vertebral arteries: Vessels are smooth and widely patent. Early origin left PICA which branches from the V3 segment. Skeleton: Degenerative disease without acute finding Other neck: Enlarged lymph nodes in the  low left posterior triangle and supraclavicular fossa. Incidental right thyroid nodule Upper chest: There is worrisome, centrally low-dense adenopathy in the bilateral mediastinum, thickest at the level of the subcarinal space where there is a 2 cm diameter. Layering right pleural effusion, at least moderate. There is subpleural nodularity and both upper lobes. Review of the MIP images confirms the above findings CTA HEAD FINDINGS Anterior circulation: Vessels are smooth and widely patent Posterior  circulation: Vessels are smooth and widely patent. Hypoplastic left P1 segment Venous sinuses: Patent Anatomic variants: As above Delayed phase: No abnormal intracranial enhancement Review of the MIP images confirms the above findings IMPRESSION: 1. Malignant-appearing adenopathy in the chest and left lower neck presumably related patient's history of breast cancer. Bilateral upper lobe subpleural nodularity suggesting lymphatic spread of tumor. Moderate right pleural effusion. 2. No significant vascular finding. Electronically Signed   By: Monte Fantasia M.D.   On: 01/27/2018 09:59   Ct Head Wo Contrast  Result Date: 01/26/2018 CLINICAL DATA:  Acute onset of altered level of consciousness. Dizziness and double vision. Eyes drifting to the right. EXAM: CT HEAD WITHOUT CONTRAST TECHNIQUE: Contiguous axial images were obtained from the base of the skull through the vertex without intravenous contrast. COMPARISON:  MRI of the brain performed 11/28/2017 FINDINGS: Brain: No evidence of acute infarction, hemorrhage, hydrocephalus, extra-axial collection or mass lesion/mass effect. The posterior fossa, including the cerebellum, brainstem and fourth ventricle, is within normal limits. The third and lateral ventricles, and basal ganglia are unremarkable in appearance. The cerebral hemispheres are symmetric in appearance, with normal gray-white differentiation. No mass effect or midline shift is seen. Vascular: No hyperdense vessel or unexpected calcification. Skull: There is no evidence of fracture; visualized osseous structures are unremarkable in appearance. Sinuses/Orbits: There is deviation of gaze to the right. The orbits are otherwise unremarkable. The paranasal sinuses and mastoid air cells are well-aerated. Other: No significant soft tissue abnormalities are seen. IMPRESSION: 1. No acute intracranial pathology seen on CT. 2. Deviation of gaze to the right, as clinically described. Electronically Signed   By:  Garald Balding M.D.   On: 01/26/2018 01:56   Ct Angio Neck W Or Wo Contrast  Result Date: 01/27/2018 CLINICAL DATA:  Stroke follow-up EXAM: CT ANGIOGRAPHY HEAD AND NECK TECHNIQUE: Multidetector CT imaging of the head and neck was performed using the standard protocol during bolus administration of intravenous contrast. Multiplanar CT image reconstructions and MIPs were obtained to evaluate the vascular anatomy. Carotid stenosis measurements (when applicable) are obtained utilizing NASCET criteria, using the distal internal carotid diameter as the denominator. CONTRAST:  169m ISOVUE-370 IOPAMIDOL (ISOVUE-370) INJECTION 76% COMPARISON:  Head CT and brain MRI from yesterday FINDINGS: CTA NECK FINDINGS Aortic arch: Normal appearance Right carotid system: Vessels are smooth and widely patent Left carotid system: Vessels are smooth and widely patent. No noted atheromatous changes Vertebral arteries: Vessels are smooth and widely patent. Early origin left PICA which branches from the V3 segment. Skeleton: Degenerative disease without acute finding Other neck: Enlarged lymph nodes in the low left posterior triangle and supraclavicular fossa. Incidental right thyroid nodule Upper chest: There is worrisome, centrally low-dense adenopathy in the bilateral mediastinum, thickest at the level of the subcarinal space where there is a 2 cm diameter. Layering right pleural effusion, at least moderate. There is subpleural nodularity and both upper lobes. Review of the MIP images confirms the above findings CTA HEAD FINDINGS Anterior circulation: Vessels are smooth and widely patent Posterior circulation: Vessels are smooth and widely  patent. Hypoplastic left P1 segment Venous sinuses: Patent Anatomic variants: As above Delayed phase: No abnormal intracranial enhancement Review of the MIP images confirms the above findings IMPRESSION: 1. Malignant-appearing adenopathy in the chest and left lower neck presumably related patient's  history of breast cancer. Bilateral upper lobe subpleural nodularity suggesting lymphatic spread of tumor. Moderate right pleural effusion. 2. No significant vascular finding. Electronically Signed   By: Monte Fantasia M.D.   On: 01/27/2018 09:59   Mr Brain Wo Contrast  Result Date: 01/26/2018 CLINICAL DATA:  Blurred vision and difficulty walking EXAM: MRI HEAD WITHOUT CONTRAST TECHNIQUE: Multiplanar, multiecho pulse sequences of the brain and surrounding structures were obtained without intravenous contrast. COMPARISON:  Head CT from earlier today FINDINGS: Brain: Subcentimeter foci of restricted diffusion along the left para median floor of the fourth ventricle (near the facial colliculus), right cerebellum, bilateral occipital cortex, and bilateral posterior frontal cortex, and right centrum semiovale. No prior ischemic injury is noted. No hemorrhage, hydrocephalus, or masslike finding Vascular: Major flow voids are preserved Skull and upper cervical spine: Negative for marrow lesion Sinuses/Orbits: Negative IMPRESSION: Small scattered acute infarcts in the bilateral cerebrum, right cerebellum, and left posterior pons. The pontine infarct is near the facial colliculus and correlates with the visual complaints. The pattern suggests central embolic disease. Electronically Signed   By: Monte Fantasia M.D.   On: 01/26/2018 12:20   Vas [place] Lower Extremity Venous (dvt)  Result Date: 01/27/2018  Lower Venous Study Indications: Stroke.  Performing Technologist: Abram Sander RVS  Examination Guidelines: A complete evaluation includes B-mode imaging, spectral Doppler, color Doppler, and power Doppler as needed of all accessible portions of each vessel. Bilateral testing is considered an integral part of a complete examination. Limited examinations for reoccurring indications may be performed as noted.  Right Venous Findings: +---------+---------------+---------+-----------+----------+-------+           CompressibilityPhasicitySpontaneityPropertiesSummary +---------+---------------+---------+-----------+----------+-------+ CFV      Full           Yes      Yes                          +---------+---------------+---------+-----------+----------+-------+ SFJ      Full                                                 +---------+---------------+---------+-----------+----------+-------+ FV Prox  Full                                                 +---------+---------------+---------+-----------+----------+-------+ FV Mid   Full                                                 +---------+---------------+---------+-----------+----------+-------+ FV DistalFull                                                 +---------+---------------+---------+-----------+----------+-------+ PFV      Full                                                 +---------+---------------+---------+-----------+----------+-------+  POP      Full           Yes      Yes                          +---------+---------------+---------+-----------+----------+-------+ PTV      Full                                                 +---------+---------------+---------+-----------+----------+-------+ PERO     Full                                                 +---------+---------------+---------+-----------+----------+-------+  Left Venous Findings: +---------+---------------+---------+-----------+----------+-------+          CompressibilityPhasicitySpontaneityPropertiesSummary +---------+---------------+---------+-----------+----------+-------+ CFV      Full           Yes      Yes                          +---------+---------------+---------+-----------+----------+-------+ SFJ      Full                                                 +---------+---------------+---------+-----------+----------+-------+ FV Prox  Full                                                  +---------+---------------+---------+-----------+----------+-------+ FV Mid   Full                                                 +---------+---------------+---------+-----------+----------+-------+ FV DistalFull                                                 +---------+---------------+---------+-----------+----------+-------+ PFV      Full                                                 +---------+---------------+---------+-----------+----------+-------+ POP      Full           Yes      Yes                          +---------+---------------+---------+-----------+----------+-------+ PTV      Full                                                 +---------+---------------+---------+-----------+----------+-------+  PERO     Full                                                 +---------+---------------+---------+-----------+----------+-------+    Summary: Right: There is no evidence of deep vein thrombosis in the lower extremity. No cystic structure found in the popliteal fossa. Left: There is no evidence of deep vein thrombosis in the lower extremity. No cystic structure found in the popliteal fossa.  *See table(s) above for measurements and observations. Electronically signed by Deitra Mayo MD on 01/27/2018 at 5:13:11 PM.    Final     PHYSICAL EXAM  Temp:  [98.2 F (36.8 C)-98.9 F (37.2 C)] 98.2 F (36.8 C) (12/31 1659) Pulse Rate:  [94-105] 97 (12/31 1659) Resp:  [16-18] 18 (12/31 1659) BP: (118-141)/(75-96) 127/76 (12/31 1659) SpO2:  [97 %-99 %] 99 % (12/31 1659)  General - Well nourished, well developed, in no apparent distress.  Ophthalmologic - fundi not visualized due to noncooperation.  Cardiovascular - Regular rate and rhythm.  Mental Status -  Level of arousal and orientation to time, place, and person were intact. Language including expression, naming, repetition, comprehension was assessed and found intact. Attention span and  concentration were normal. Fund of Knowledge was assessed and was intact.  Cranial Nerves II - XII - II - Visual field intact OU. III, IV, VI - Extraocular movement exam showed left eye abduction palsy. V - Facial sensation intact bilaterally. VII - Facial movement intact bilaterally. VIII - Hearing & vestibular intact bilaterally. X - Palate elevates symmetrically. XI - Chin turning & shoulder shrug intact bilaterally. XII - Tongue protrusion intact.  Motor Strength - The patient's strength was normal in all extremities and pronator drift was absent.  Bulk was normal and fasciculations were absent.   Motor Tone - Muscle tone was assessed at the neck and appendages and was normal.  Reflexes - The patient's reflexes were symmetrical in all extremities and she had no pathological reflexes.  Sensory - Light touch, temperature/pinprick were assessed and were symmetrical.    Coordination - The patient had normal movements in the hands and feet with no ataxia or dysmetria.  Tremor was absent.  Gait and Station - deferred.   ASSESSMENT/PLAN Ms. Brandi Dickson is a 57 y.o. female with history of diabetes, hypertension, breast cancer status post surgery, chemo, radiation and currently on anastrozole admitted for blurred vision, double vision, imbalance. No tPA given due to outside window.    Stroke:  bilateral scattered punctate infarcts involving bilateral MCA, MCA/ACA, right cerebellum and posterior pons, embolic pattern, likely due to hypercoagulable state from metastatic breast cancer  Resultant left CN VI palsy  MRI  bilateral scattered punctate infarcts involving bilateral MCA, MCA/ACA, right cerebellum and posterior pons  CTA head and neck normal cerebral vasculature.  However concerning for metastasis at mediastinum and bilateral pleural space  2D Echo EF 60 to 65%  LE venous Doppler negative for DVT  LDL 123  HgbA1c 5.9  Lovenox for VTE prophylaxis  aspirin 81 mg  daily prior to admission, now on aspirin 325 mg daily. [name] has discussed with Dr. Ree Kida and recommended lovenox therapeutic dose for stroke prevention given incoming pleural biopsy. I think it is reasonable. In the long run, she can take DOAC once no procedure planned.   Patient counseled to be  compliant with her antithrombotic medications  Ongoing aggressive stroke risk factor management  Therapy recommendations: Home health PT  Disposition: Pending  Breast cancer  Status post lumpectomy, chemotherapy and radiation  Currently on anastrozole  Follow with [name] at [place] center  MRI brain with contrast showed multifocal brain metastasis  CT chest, abdomen and pelvis showed wide spread metastasis  CTA neck concerning for metastasis at mediastinum and bilateral pleural space  Plan to transfer to [facility] cancer center for further management  Recommend anticoagulation for stroke and DVT prophylaxis. It is reasonable to start with levonox therapeutic dose given incoming procedures.  Long-term wise, patient can take DOAC was no procedure planned.  Diabetes  HgbA1c 5.9 goal < 7.0  Controlled  CBG monitoring  SSI  PCP follow up  Hypertension . Stable  Long term BP goal normotensive  Hyperlipidemia  Home meds: None  LDL 123, goal < 70  Recommend statin whenever appropriate from oncology standpoint  Other Stroke Risk Factors  Former Cigarette smoker  Other Active Problems    Hospital day # 1  Neurology will sign off. Please call with questions. No neuro follow up needed at this time. Thanks for the consult.   Rosalin Hawking, MD PhD Stroke Neurology 01/28/2018 6:10 PM    To contact Stroke Continuity provider, please refer to [email or web address] After hours, contact General Neurology

## 2018-01-29 LAB — GLUCOSE, CAPILLARY
Glucose-Capillary: 115 mg/dL — ABNORMAL HIGH (ref 70–99)
Glucose-Capillary: 125 mg/dL — ABNORMAL HIGH (ref 70–99)
Glucose-Capillary: 93 mg/dL (ref 70–99)
Glucose-Capillary: 100 mg/dL — ABNORMAL HIGH (ref 70–99)

## 2018-01-29 MED ORDER — LORAZEPAM 2 MG/ML IJ SOLN
0.5000 mg | INTRAMUSCULAR | Status: DC | PRN
  Filled 2018-01-29: qty 1

## 2018-01-29 NOTE — Progress Notes (Addendum)
[facility] PROGRESS NOTE    Progress Note  Brandi Dickson  WFU:[id number] DOB: 1960/06/03 DOA: 01/26/2018 PCP: Orpah Melter, MD     Brief Narrative:   Brandi Dickson is an 58 y.o. female past medical history significant for stage II breast cancer, peripheral neuropathy diabetes mellitus type 2 presented on 1228 as she noticed her eyes shifted to the right and she could not look to her left to go baby aspirin and came into the ED, and MRI of the brain was done showed a CVA neurology was consulted.  Assessment/Plan:   Bilateral schedule punctuated CVA: - Likely due to hypercoagulable state from metastatic breast cancer - CT of the head showed no acute findings. - Neurology was consulted who recommended an MRI that showed small scattered acute infarct bilateral in the cerebellum left posterior pons, suggestive of embolic disease. - Neurology also recommended CTA of the head and neck showed malignant appearing adenopathy in the chest and left lower neck, bilateral lobe subpleural nodularity suggestive of lymphatic spread of tumor with moderate right pleural effusion - A brain MRI with contrast showed at least 14 focal lesions suggestive of malignancy. -It was discussed with oncology and they recommended Lovenox for therapeutic stroke prevention giving incoming pleural biopsy. Once her biopsy has been completed we can change to oral DOAC's. Keep patient on therapy recommended home health OT and PT..  Stage IV breast cancer with brain mets: Currently on hormonal therapy. Allergy was consulted recommended thoracocentesis or biopsy for tissue diagnosis. Due to MRI results with brain mets radiation oncology has been consulted. CT scan of the abdomen and pelvis done on 01/28/2018 showed metastatic disease including mediastinal, right hilar adenopathy, bilateral pulmonary nodules, right pleural effusion and peritoneal nodularity with metastatic and splenic disease.  Ulcerative  colitis/chronic diarrhea: Continue mesalamine Imodium and Bentyl.  Diabetes mellitus type 2: A1c 5.9.  Essential hypertension: We will allow permissive hypertension given CVA.  Asthma: Continue inhalers as needed.  DVT prophylaxis: lovenox Family Communication:none Disposition Plan/Barrier to D/C: once symptoms  Code Status:     Code Status Orders  (From admission, onward)         Start     Ordered   01/26/18 0845  Full code  Continuous     01/26/18 0848        Code Status History    Date Active Date Inactive Code Status Order ID Comments User Context   05/31/2017 2220 06/03/2017 1516 Full Code [id number]  Toy Baker, MD Inpatient   07/19/2016 1644 07/20/2016 1559 Full Code [id number]  Verlee Monte, MD Inpatient   06/07/2016 2150 06/09/2016 1515 Full Code [id number]  Phillips Grout, MD Inpatient   05/19/2016 0043 05/24/2016 1758 Full Code [id number]  Toy Baker, MD Inpatient   03/06/2016 1246 03/07/2016 1858 Full Code [id number]  Danae Orleans, PA-C Inpatient        IV Access:    Peripheral IV   Procedures and diagnostic studies:   Ct Angio Head W Or Wo Contrast  Result Date: 01/27/2018 CLINICAL DATA:  Stroke follow-up EXAM: CT ANGIOGRAPHY HEAD AND NECK TECHNIQUE: Multidetector CT imaging of the head and neck was performed using the standard protocol during bolus administration of intravenous contrast. Multiplanar CT image reconstructions and MIPs were obtained to evaluate the vascular anatomy. Carotid stenosis measurements (when applicable) are obtained utilizing NASCET criteria, using the distal internal carotid diameter as the denominator. CONTRAST:  142m ISOVUE-370 IOPAMIDOL (ISOVUE-370) INJECTION 76% COMPARISON:  Head CT and brain MRI from  yesterday FINDINGS: CTA NECK FINDINGS Aortic arch: Normal appearance Right carotid system: Vessels are smooth and widely patent Left carotid system: Vessels are smooth and widely patent. No noted atheromatous changes  Vertebral arteries: Vessels are smooth and widely patent. Early origin left PICA which branches from the V3 segment. Skeleton: Degenerative disease without acute finding Other neck: Enlarged lymph nodes in the low left posterior triangle and supraclavicular fossa. Incidental right thyroid nodule Upper chest: There is worrisome, centrally low-dense adenopathy in the bilateral mediastinum, thickest at the level of the subcarinal space where there is a 2 cm diameter. Layering right pleural effusion, at least moderate. There is subpleural nodularity and both upper lobes. Review of the MIP images confirms the above findings CTA HEAD FINDINGS Anterior circulation: Vessels are smooth and widely patent Posterior circulation: Vessels are smooth and widely patent. Hypoplastic left P1 segment Venous sinuses: Patent Anatomic variants: As above Delayed phase: No abnormal intracranial enhancement Review of the MIP images confirms the above findings IMPRESSION: 1. Malignant-appearing adenopathy in the chest and left lower neck presumably related patient's history of breast cancer. Bilateral upper lobe subpleural nodularity suggesting lymphatic spread of tumor. Moderate right pleural effusion. 2. No significant vascular finding. Electronically Signed   By: Monte Fantasia M.D.   On: 01/27/2018 09:59   Ct Angio Neck W Or Wo Contrast  Result Date: 01/27/2018 CLINICAL DATA:  Stroke follow-up EXAM: CT ANGIOGRAPHY HEAD AND NECK TECHNIQUE: Multidetector CT imaging of the head and neck was performed using the standard protocol during bolus administration of intravenous contrast. Multiplanar CT image reconstructions and MIPs were obtained to evaluate the vascular anatomy. Carotid stenosis measurements (when applicable) are obtained utilizing NASCET criteria, using the distal internal carotid diameter as the denominator. CONTRAST:  11m ISOVUE-370 IOPAMIDOL (ISOVUE-370) INJECTION 76% COMPARISON:  Head CT and brain MRI from yesterday  FINDINGS: CTA NECK FINDINGS Aortic arch: Normal appearance Right carotid system: Vessels are smooth and widely patent Left carotid system: Vessels are smooth and widely patent. No noted atheromatous changes Vertebral arteries: Vessels are smooth and widely patent. Early origin left PICA which branches from the V3 segment. Skeleton: Degenerative disease without acute finding Other neck: Enlarged lymph nodes in the low left posterior triangle and supraclavicular fossa. Incidental right thyroid nodule Upper chest: There is worrisome, centrally low-dense adenopathy in the bilateral mediastinum, thickest at the level of the subcarinal space where there is a 2 cm diameter. Layering right pleural effusion, at least moderate. There is subpleural nodularity and both upper lobes. Review of the MIP images confirms the above findings CTA HEAD FINDINGS Anterior circulation: Vessels are smooth and widely patent Posterior circulation: Vessels are smooth and widely patent. Hypoplastic left P1 segment Venous sinuses: Patent Anatomic variants: As above Delayed phase: No abnormal intracranial enhancement Review of the MIP images confirms the above findings IMPRESSION: 1. Malignant-appearing adenopathy in the chest and left lower neck presumably related patient's history of breast cancer. Bilateral upper lobe subpleural nodularity suggesting lymphatic spread of tumor. Moderate right pleural effusion. 2. No significant vascular finding. Electronically Signed   By: JMonte FantasiaM.D.   On: 01/27/2018 09:59   Ct Chest W Contrast  Result Date: 01/28/2018 CLINICAL DATA:  History of breast cancer. Evaluate for metastatic disease. EXAM: CT CHEST, ABDOMEN, AND PELVIS WITH CONTRAST TECHNIQUE: Multidetector CT imaging of the chest, abdomen and pelvis was performed following the standard protocol during bolus administration of intravenous contrast. CONTRAST:  1064mOMNIPAQUE IOHEXOL 300 MG/ML  SOLN COMPARISON:  Chest CT 05/13/2016;  abdomen  pelvic CT 05/31/2017 FINDINGS: CT CHEST FINDINGS Cardiovascular: Normal heart size.  Trace pericardial effusion. Mediastinum/Nodes: Interval development of mediastinal and right hilar adenopathy. Reference 2.1 cm right paratracheal lymph node (image 17; series 3). Reference 2.5 cm subcarinal node (image 25; series 3). Reference 2.6 x 4.2 cm right pericardiophrenic mass (image 31; series 3). There is a 1.5 cm left supraclavicular lymph node (image 7; series 3). Lungs/Pleura: Irregular right hilar soft tissue measures 3.4 x 3.2 cm compatible with right hilar adenopathy. This markedly narrows the right middle and lower lobe bronchi. New moderate right pleural effusion. Possible enhancing pleural nodularity within the inferior right pleural space (image 41; series 3). Interval development of multiple bilateral pulmonary nodules. Reference 1.4 cm nodule in the lingula (image 91; series 5). Reference 0.4 cm nodule within the left lower lobe (image 104; series 5). Multiple right upper lobe subpleural nodules measuring up to 5 mm (image 46; series 5). No pneumothorax. Musculoskeletal: Thoracic spine degenerative changes. No aggressive or acute appearing osseous lesions. Postsurgical changes left breast. CT ABDOMEN PELVIS FINDINGS Hepatobiliary: Interval development of a 5.2 x 4.2 cm low-attenuation mass within the right hepatic lobe (image 41; series 3). There is an adjacent 1.5 cm low-attenuation nodule (image 39; series 3). New 2.5 x 1.6 cm low-attenuation mass right hepatic lobe (image 51; series 3). Additional small low-attenuation lesions are demonstrated within the liver. Gallbladder is unremarkable. No intrahepatic or extrahepatic biliary ductal dilatation. Pancreas: Unremarkable Spleen: Interval development of a 2.2 x 1.8 cm low-attenuation mass within the spleen (image 52; series 3). Adrenals/Urinary Tract: Stable small left adrenal nodule. Small right adrenal myelolipoma. Kidneys enhance symmetrically with  contrast. No hydronephrosis. Streak artifact limits evaluation of the pelvis. Stomach/Bowel: Re demonstrated circumferential wall thickening of the sigmoid colon and rectum. Small pericolonic lymph nodes are demonstrated. Normal appendix. Vascular/Lymphatic: Normal caliber abdominal aorta. No retroperitoneal lymphadenopathy. Reproductive: Streak artifact limits evaluation of the pelvis. Other: None. Musculoskeletal: Bilateral hip arthroplasties. Stable patchy sclerosis left ilium lumbar spine degenerative changes. No aggressive or acute appearing osseous lesions. IMPRESSION: 1. Interval development of metastatic disease including mediastinal and right hilar adenopathy, bilateral pulmonary nodules, right pleural effusion with suggestion of peritoneal nodularity, hepatic metastatic disease and splenic metastatic disease. 2. Re demonstrated wall thickening of the sigmoid colon and rectum which may be secondary to an inflammatory process. Electronically Signed   By: Lovey Newcomer M.D.   On: 01/28/2018 16:53   Mr Brain W Contrast  Result Date: 01/28/2018 CLINICAL DATA:  Invasive breast cancer, stage IV, recurrence. Abnormal MRI the brain. Blurred vision and difficulty walking. EXAM: MRI HEAD WITH CONTRAST TECHNIQUE: Multiplanar, multiecho pulse sequences of the brain and surrounding structures were obtained with intravenous contrast. CONTRAST:  8 mL Gadavist COMPARISON:  MRI brain 01/26/2018. CTA of the head and neck 01/27/2018 FINDINGS: Brain: A 4 mm enhancing lesion present posteriorly in the right cerebellum on image 17 of series 3. Two punctate foci of enhancement are present in the anterior inferior left cerebellum on image 14 of series 3. Two additional punctate foci are present at the inferior aspect of the cerebellum bilaterally on image 11 of series 3. A punctate enhancing lesion is present at the inferior left temporal lobe on image 22 of series 3. A lesion in the left parietal white matter is best seen on  images 36 and 37 of series 3. Cortical and subcortical punctate a lesions are present in the left parietal lobe images 42 and 41. An anterior right frontal  lobe 3 mm enhancing nodule is present on image 43 of series 3. A linear cortical lesion measures 5.5 mm in the anterior right frontal lobe on image 41 of series 3. 3 mm lesion is present the anterior right frontal lobe white matter on image 38. A subcortical white matter lesion in the right occipital lobe measures 3 mm on image 32. The foci of enhancement are separate from the areas of restricted diffusion. The ventricles are of normal size. No significant extra-axial fluid collection is present. Vascular: Normal vascular enhancement is present Skull and upper cervical spine: Craniocervical junction is normal. Discrete osseous lesions are present. The upper cervical spine is within normal limits. Sinuses/Orbits: The paranasal sinuses and mastoid air cells are clear. IMPRESSION: 1. At least 14 focal enhancing lesions are present in the cerebrum and cerebellum. The largest lesion is in the anterior right frontal lobe measuring 5.5 mm. These are separate from the previously seen foci of restricted diffusion and likely represent diffuse metastases. 2. No enhancement associated with the areas of restricted diffusion previously seen. These are likely separate areas of embolic infarcts. Electronically Signed   By: San Morelle M.D.   On: 01/28/2018 11:07   Ct Abdomen Pelvis W Contrast  Result Date: 01/28/2018 CLINICAL DATA:  History of breast cancer. Evaluate for metastatic disease. EXAM: CT CHEST, ABDOMEN, AND PELVIS WITH CONTRAST TECHNIQUE: Multidetector CT imaging of the chest, abdomen and pelvis was performed following the standard protocol during bolus administration of intravenous contrast. CONTRAST:  127m OMNIPAQUE IOHEXOL 300 MG/ML  SOLN COMPARISON:  Chest CT 05/13/2016; abdomen pelvic CT 05/31/2017 FINDINGS: CT CHEST FINDINGS Cardiovascular:  Normal heart size.  Trace pericardial effusion. Mediastinum/Nodes: Interval development of mediastinal and right hilar adenopathy. Reference 2.1 cm right paratracheal lymph node (image 17; series 3). Reference 2.5 cm subcarinal node (image 25; series 3). Reference 2.6 x 4.2 cm right pericardiophrenic mass (image 31; series 3). There is a 1.5 cm left supraclavicular lymph node (image 7; series 3). Lungs/Pleura: Irregular right hilar soft tissue measures 3.4 x 3.2 cm compatible with right hilar adenopathy. This markedly narrows the right middle and lower lobe bronchi. New moderate right pleural effusion. Possible enhancing pleural nodularity within the inferior right pleural space (image 41; series 3). Interval development of multiple bilateral pulmonary nodules. Reference 1.4 cm nodule in the lingula (image 91; series 5). Reference 0.4 cm nodule within the left lower lobe (image 104; series 5). Multiple right upper lobe subpleural nodules measuring up to 5 mm (image 46; series 5). No pneumothorax. Musculoskeletal: Thoracic spine degenerative changes. No aggressive or acute appearing osseous lesions. Postsurgical changes left breast. CT ABDOMEN PELVIS FINDINGS Hepatobiliary: Interval development of a 5.2 x 4.2 cm low-attenuation mass within the right hepatic lobe (image 41; series 3). There is an adjacent 1.5 cm low-attenuation nodule (image 39; series 3). New 2.5 x 1.6 cm low-attenuation mass right hepatic lobe (image 51; series 3). Additional small low-attenuation lesions are demonstrated within the liver. Gallbladder is unremarkable. No intrahepatic or extrahepatic biliary ductal dilatation. Pancreas: Unremarkable Spleen: Interval development of a 2.2 x 1.8 cm low-attenuation mass within the spleen (image 52; series 3). Adrenals/Urinary Tract: Stable small left adrenal nodule. Small right adrenal myelolipoma. Kidneys enhance symmetrically with contrast. No hydronephrosis. Streak artifact limits evaluation of the  pelvis. Stomach/Bowel: Re demonstrated circumferential wall thickening of the sigmoid colon and rectum. Small pericolonic lymph nodes are demonstrated. Normal appendix. Vascular/Lymphatic: Normal caliber abdominal aorta. No retroperitoneal lymphadenopathy. Reproductive: Streak artifact limits evaluation of the  pelvis. Other: None. Musculoskeletal: Bilateral hip arthroplasties. Stable patchy sclerosis left ilium lumbar spine degenerative changes. No aggressive or acute appearing osseous lesions. IMPRESSION: 1. Interval development of metastatic disease including mediastinal and right hilar adenopathy, bilateral pulmonary nodules, right pleural effusion with suggestion of peritoneal nodularity, hepatic metastatic disease and splenic metastatic disease. 2. Re demonstrated wall thickening of the sigmoid colon and rectum which may be secondary to an inflammatory process. Electronically Signed   By: Lovey Newcomer M.D.   On: 01/28/2018 16:53   Vas [place] Lower Extremity Venous (dvt)  Result Date: 01/27/2018  Lower Venous Study Indications: Stroke.  Performing Technologist: Abram Sander RVS  Examination Guidelines: A complete evaluation includes B-mode imaging, spectral Doppler, color Doppler, and power Doppler as needed of all accessible portions of each vessel. Bilateral testing is considered an integral part of a complete examination. Limited examinations for reoccurring indications may be performed as noted.  Right Venous Findings: +---------+---------------+---------+-----------+----------+-------+          CompressibilityPhasicitySpontaneityPropertiesSummary +---------+---------------+---------+-----------+----------+-------+ CFV      Full           Yes      Yes                          +---------+---------------+---------+-----------+----------+-------+ SFJ      Full                                                 +---------+---------------+---------+-----------+----------+-------+ FV Prox  Full                                                  +---------+---------------+---------+-----------+----------+-------+ FV Mid   Full                                                 +---------+---------------+---------+-----------+----------+-------+ FV DistalFull                                                 +---------+---------------+---------+-----------+----------+-------+ PFV      Full                                                 +---------+---------------+---------+-----------+----------+-------+ POP      Full           Yes      Yes                          +---------+---------------+---------+-----------+----------+-------+ PTV      Full                                                 +---------+---------------+---------+-----------+----------+-------+  PERO     Full                                                 +---------+---------------+---------+-----------+----------+-------+  Left Venous Findings: +---------+---------------+---------+-----------+----------+-------+          CompressibilityPhasicitySpontaneityPropertiesSummary +---------+---------------+---------+-----------+----------+-------+ CFV      Full           Yes      Yes                          +---------+---------------+---------+-----------+----------+-------+ SFJ      Full                                                 +---------+---------------+---------+-----------+----------+-------+ FV Prox  Full                                                 +---------+---------------+---------+-----------+----------+-------+ FV Mid   Full                                                 +---------+---------------+---------+-----------+----------+-------+ FV DistalFull                                                 +---------+---------------+---------+-----------+----------+-------+ PFV      Full                                                  +---------+---------------+---------+-----------+----------+-------+ POP      Full           Yes      Yes                          +---------+---------------+---------+-----------+----------+-------+ PTV      Full                                                 +---------+---------------+---------+-----------+----------+-------+ PERO     Full                                                 +---------+---------------+---------+-----------+----------+-------+    Summary: Right: There is no evidence of deep vein thrombosis in the lower extremity. No cystic structure found in the popliteal fossa. Left: There is no evidence of deep vein thrombosis in the lower extremity. No cystic structure found in the popliteal fossa.  *  See table(s) above for measurements and observations. Electronically signed by Deitra Mayo MD on 01/27/2018 at 5:13:11 PM.    Final      Medical Consultants:    None.  Anti-Infectives:    Subjective:    Brandi Dickson very labile motion after telling her she has stage IV breast cancer.  She denies any pain.  Objective:    Vitals:   01/28/18 1937 01/28/18 2209 01/29/18 0204 01/29/18 0653  BP: 122/79 114/81 112/85 116/85  Pulse: (!) 111 (!) 115 (!) 105 (!) 102  Resp: 18 16 16 16   Temp: 98.3 F (36.8 C) 99.2 F (37.3 C) 98.5 F (36.9 C) 98.5 F (36.9 C)  TempSrc: Axillary Oral Oral Oral  SpO2: 100% 98% 94% 96%  Weight:  79.5 kg    Height:  5' 4"  (1.626 m)      Intake/Output Summary (Last 24 hours) at 01/29/2018 0820 Last data filed at 01/28/2018 1849 Gross per 24 hour  Intake 2140.81 ml  Output -  Net 2140.81 ml   Filed Weights   01/27/18 0046 01/28/18 2209  Weight: 78.5 kg 79.5 kg    Exam: General exam: In no acute distress. Respiratory system: Good air movement and clear to auscultation. Cardiovascular system: S1 & S2 heard, RRR. Gastrointestinal system: Abdomen is nondistended, soft and nontender.  Central nervous system:  Alert and oriented. No focal neurological deficits. Extremities: No pedal edema. Skin: No rashes, lesions or ulcers Psychiatry: Judgement and insight appear normal. Mood & affect appropriate.    Data Reviewed:    Labs: Basic Metabolic Panel: Recent Labs  Lab 01/24/18 0753 01/26/18 0051 01/28/18 0431  NA 140 138 140  K 4.0 3.7 4.0  CL 103 103 105  CO2 28 27 25   GLUCOSE 103* 120* 95  BUN 8 17 8   CREATININE 0.98 1.02* 0.84  CALCIUM 9.6 9.0 8.9   GFR Estimated Creatinine Clearance: 73.5 mL/min (by C-G formula based on SCr of 0.84 mg/dL). Liver Function Tests: Recent Labs  Lab 01/24/18 0753 01/26/18 0051  AST 20 20  ALT 15 15  ALKPHOS 106 85  BILITOT 0.3 0.4  PROT 7.6 7.2  ALBUMIN 3.2* 3.3*   No results for input(s): LIPASE, AMYLASE in the last 168 hours. No results for input(s): AMMONIA in the last 168 hours. Coagulation profile Recent Labs  Lab 01/26/18 0051  INR 0.98    CBC: Recent Labs  Lab 01/24/18 0753 01/26/18 0051 01/28/18 0431  WBC 7.4 7.8 4.7  NEUTROABS 4.9 5.2  --   HGB 12.9 12.1 11.5*  HCT 42.0 39.7 37.5  MCV 86.2 85.7 83.5  PLT 305 296 229   Cardiac Enzymes: No results for input(s): CKTOTAL, CKMB, CKMBINDEX, TROPONINI in the last 168 hours. BNP (last 3 results) No results for input(s): PROBNP in the last 8760 hours. CBG: Recent Labs  Lab 01/28/18 0639 01/28/18 1132 01/28/18 1702 01/28/18 2111 01/29/18 0746  GLUCAP 96 88 74 120* 115*   D-Dimer: No results for input(s): DDIMER in the last 72 hours. Hgb A1c: Recent Labs    01/28/18 0431  HGBA1C 5.3   Lipid Profile: Recent Labs    01/28/18 0431  CHOL 190  HDL 41  LDLCALC 123*  TRIG 131  CHOLHDL 4.6   Thyroid function studies: No results for input(s): TSH, T4TOTAL, T3FREE, THYROIDAB in the last 72 hours.  Invalid input(s): FREET3 Anemia work up: No results for input(s): VITAMINB12, FOLATE, FERRITIN, TIBC, IRON, RETICCTPCT in the last 72 hours. Sepsis Labs:  Recent  Labs  Lab 01/24/18 0753 01/26/18 0051 01/28/18 0431  WBC 7.4 7.8 4.7   Microbiology No results found for this or any previous visit (from the past 240 hour(s)).   Medications:   . acidophilus  1 capsule Oral Daily  . anastrozole  1 mg Oral Daily  . aspirin EC  325 mg Oral Daily  . enoxaparin (LOVENOX) injection  40 mg Subcutaneous Q24H  . gabapentin  200 mg Oral QHS  . insulin aspart  0-9 Units Subcutaneous TID WC  . Mesalamine  800 mg Oral BID   Continuous Infusions: . sodium chloride 75 mL/hr at 01/28/18 2213     LOS: 2 days   Charlynne Cousins  [facility]   *Please refer to [email or web address], password [email or web address] to get updated schedule on who will round on this patient, as hospitalists switch teams weekly. If 7PM-7AM, please contact night-coverage at [email or web address], password [email or web address] for any overnight needs.  01/29/2018, 8:20 AM

## 2018-01-29 NOTE — Progress Notes (Signed)
Occupational Therapy Treatment Patient Details Name: Brandi Dickson MRN: [id number] DOB: Nov 05, 1960 Today's Date: 01/29/2018    History of present illness Pt is a 58 y/o fmale admitted 01/26/18 presenting with blurred vision and difficulty walking. MRI reveals "Small scattered acute infarcts in the bilateral cerebrum, right cerebellum, and left posterior pons. The pontine infarct is near the facial colliculus and correlates with the visual complaints. The pattern suggests central embolic disease."  PMH: breast CA, B THA, arthritis, HTN. MRI with contrast 12/31 + at least 14 lesions in cerebrum and cerebellum and likely represent diffuse metastases.    OT comments  Pt transferred from [facility] to [facility] 12/31. Focus of session on education regarding partial occlusion on L lens with husband. Pt/husband verbalized understanding. Pt talking about her recent diagnosis of cancer and states she is upset that she doesn't know how "one doctor can tell her it's treatable and the other doctor tells her she is going to die". Relayed the patient's concerns/feelings to the nurse and encouraged the pt to discuss her concerns with the MD and oncologist. Also relayed to the nurse that the patient's brother just passed away from cancer in 2017/09/25, and the pt was his primary caregiver. Functionally, pt continues to be appropriate for DC home with [facility]. Will continue to follow acutely.   Follow Up Recommendations  Home health OT;Supervision - Intermittent    Equipment Recommendations  None recommended by OT    Recommendations for Other Services      Precautions / Restrictions Precautions Precautions: Fall Precaution Comments: diplopia       Mobility Bed Mobility Overal bed mobility: Modified Independent                Transfers Overall transfer level: S Equipment used: Rolling walker (2 wheeled)                  Balance Overall balance assessment: Needs assistance   Sitting balance-Leahy  Scale: Good       Standing balance-Leahy Scale: Fair                             ADL either performed or assessed with clinical judgement   ADL                                         General ADL Comments: able to complete basic ADL tasks @ S level using RW     Vision   Additional Comments: L eye is limited in abduction. Able to range to midline. Sees double @ 30 from nose in R fieldq   Perception     Praxis      Cognition Arousal/Alertness: Awake/alert Behavior During Therapy: WFL for tasks assessed/performed Overall Cognitive Status: Within Functional Limits for tasks assessed                                 General Comments: Pt just learned she has cancer adn is "dealing with it"        Exercises     Shoulder Instructions       General Comments Husband present and educated on use of partial occlsuion. Husband verbalized understanding.     Pertinent Vitals/ Pain       Pain Assessment: No/denies pain  Home  Living                                          Prior Functioning/Environment              Frequency  Min 3X/week        Progress Toward Goals  OT Goals(current goals can now be found in the care plan section)     Acute Rehab OT Goals Patient Stated Goal: to return to work OT Goal Formulation: With patient Time For Goal Achievement: 02/10/18 Potential to Achieve Goals: Good ADL Goals Pt Will Perform Grooming: with modified independence;standing Pt Will Perform Lower Body Bathing: with modified independence;sit to/from stand Pt Will Perform Lower Body Dressing: with modified independence;sit to/from stand Pt Will Transfer to Toilet: with modified independence;ambulating;regular height toilet Pt Will Perform Tub/Shower Transfer: with modified independence;ambulating;3 in 1;Tub transfer Additional ADL Goal #1: Pt will utilize visual compensatory techniques with modified  independence in order to safely engage in self care and mobility. Additional ADL Goal #2: Pt will independtnly understanding of use of parital occlsuion to improve functional vision  Plan Discharge plan needs to be updated;Frequency remains appropriate    Co-evaluation                 AM-PAC OT "6 Clicks" Daily Activity     Outcome Measure   Help from another person eating meals?: None Help from another person taking care of personal grooming?: None Help from another person toileting, which includes using toliet, bedpan, or urinal?: A Little Help from another person bathing (including washing, rinsing, drying)?: A Little Help from another person to put on and taking off regular upper body clothing?: None Help from another person to put on and taking off regular lower body clothing?: None 6 Click Score: 22    End of Session Equipment Utilized During Treatment: Rolling walker  OT Visit Diagnosis: Unsteadiness on feet (R26.81);Low vision, both eyes (H54.2)   Activity Tolerance Patient tolerated treatment well   Patient Left in bed;with call bell/phone within reach;with family/visitor present   Nurse Communication Other (comment)(use of partial occlusion)        Time: 1611-1630 OT Time Calculation (min): 19 min  Charges: OT General Charges $OT Visit: 1 Visit OT Treatments $Therapeutic Activity: 8-22 mins  Maurie Boettcher, OT/L   Acute OT Clinical Specialist [place] Pager [phone] Office [phone]    [facility] 01/29/2018, 5:10 PM

## 2018-01-29 NOTE — Progress Notes (Signed)
Patient very upset after Dr. Olevia Bowens told her she had Stage 4 cancer. She states no one has told her that, and her oncologist said her cancer was treatable and she never mentioned she had stage 4 cancer. Patient was crying and very upset. Told me she did not want Dr. Olevia Bowens in her room again. She asked to have a new MD assigned to her. Spoke with Dr. Olevia Bowens about this and he agreed to get another MD assigned to her. Family and friends have been in to visit. Patient has been weepy off and on today. Husband at bedside.

## 2018-01-29 NOTE — Progress Notes (Signed)
Physical Therapy Treatment Patient Details Name: Brandi Dickson MRN: [id number] DOB: 1960-10-01 Today's Date: 01/29/2018    History of Present Illness Pt is a 58 y/o fmale admitted 01/26/18 presenting with blurred vision and difficulty walking. MRI reveals "Small scattered acute infarcts in the bilateral cerebrum, right cerebellum, and left posterior pons. The pontine infarct is near the facial colliculus and correlates with the visual complaints. The pattern suggests central embolic disease."  PMH: breast CA, B THA, arthritis, HTN. CT 12/31 + Interval development of metastatic disease including mediastinal and right hilar adenopathy, B pulmonary nodlules, R pleural effusion with suggestion of peritoneal nodularity, hepatic metastatic disease adn splenic metastatic desease.     PT Comments    Pt with improved ambulation distance of 400 ft today, and required supervision to min guard assist for all mobility. Pt with dyspnea during ambulation, sats at 98% and HR 125 bpm. PT to continue to follow acutely.   Follow Up Recommendations  Home health PT;Supervision for mobility/OOB     Equipment Recommendations  None recommended by PT    Recommendations for Other Services       Precautions / Restrictions Precautions Precautions: Fall Precaution Comments: diplopia    Mobility  Bed Mobility Overal bed mobility: Needs Assistance Bed Mobility: Supine to Sit;Sit to Supine     Supine to sit: Supervision Sit to supine: Supervision   General bed mobility comments: supervision for safety, increased time.   Transfers Overall transfer level: Needs assistance Equipment used: Rolling walker (2 wheeled) Transfers: Sit to/from Stand Sit to Stand: Supervision         General transfer comment: supervision for safety. Pt with use of bilateral hands on RW for powering up before this could be corrected by PT.   Ambulation/Gait Ambulation/Gait assistance: Supervision Gait Distance (Feet): 400  Feet Assistive device: Rolling walker (2 wheeled) Gait Pattern/deviations: Step-through pattern;Decreased stride length Gait velocity: normal    General Gait Details: Supervision for safety. Pt with dyspnea 2/4 after ~300 ft ambulation. Pt's SpO2 and HR 98% and 125 bpm, respectively.    Stairs Stairs: Yes Stairs assistance: Min guard Stair Management: One rail Right;Sideways;Step to pattern Number of Stairs: 3 General stair comments: Pt performed without difficulty   Wheelchair Mobility    Modified Rankin (Stroke Patients Only)       Balance Overall balance assessment: Needs assistance Sitting-balance support: No upper extremity supported Sitting balance-Leahy Scale: Good     Standing balance support: No upper extremity supported Standing balance-Leahy Scale: Fair                              Cognition Arousal/Alertness: Awake/alert Behavior During Therapy: WFL for tasks assessed/performed Overall Cognitive Status: Within Functional Limits for tasks assessed                                 General Comments: Pt just learned she has cancer adn is "dealing with it"      Exercises General Exercises - Lower Extremity Long Arc Quad: AROM;Both;15 reps;Seated Mini-Sqauts: AROM;Both;10 reps;Seated;Standing(sit to stands from bed)    General Comments General comments (skin integrity, edema, etc.): Pt stating that she just found out her cancer is metastatic and "terminal". Pt states that the hospitalist told her it was terminal, but she had not heard that before and was upset by it.       Pertinent Vitals/Pain  Pain Assessment: No/denies pain Pain Intervention(s): Monitored during session    Home Living                      Prior Function            PT Goals (current goals can now be found in the care plan section) Acute Rehab PT Goals Patient Stated Goal: to return to work PT Goal Formulation: With patient Time For Goal  Achievement: 02/10/18 Potential to Achieve Goals: Good Progress towards PT goals: Progressing toward goals    Frequency    Min 4X/week      PT Plan Current plan remains appropriate    Co-evaluation              AM-PAC PT "6 Clicks" Mobility   Outcome Measure  Help needed turning from your back to your side while in a flat bed without using bedrails?: None Help needed moving from lying on your back to sitting on the side of a flat bed without using bedrails?: None Help needed moving to and from a bed to a chair (including a wheelchair)?: A Little Help needed standing up from a chair using your arms (e.g., wheelchair or bedside chair)?: A Little Help needed to walk in hospital room?: A Little Help needed climbing 3-5 steps with a railing? : A Little 6 Click Score: 20    End of Session Equipment Utilized During Treatment: Gait belt Activity Tolerance: Patient tolerated treatment well Patient left: in bed;with call bell/phone within reach;Other (comment)(with OT in room ) Nurse Communication: Mobility status PT Visit Diagnosis: Unsteadiness on feet (R26.81);Other abnormalities of gait and mobility (R26.89);Other symptoms and signs involving the nervous system (R29.898)     Time: 7893-8101 PT Time Calculation (min) (ACUTE ONLY): 20 min  Charges:  $Gait Training: 8-22 mins                    Julien Girt, PT Acute Rehabilitation Services Pager [phone]  Office [phone]    [place] 01/29/2018, 5:23 PM

## 2018-01-29 NOTE — Consult Note (Signed)
Radiation Oncology         [phone] ________________________________  Initial inpatient Consultation  Name: BIANKA LIBERATI MRN: [id number]  Date of Service: 01/25/2018 DOB: 1960-07-01  IO:EVOJJK, Annie Main, MD  No ref. provider found   REFERRING PHYSICIAN: Dr. Cristal Ford   DIAGNOSIS: 58 y.o. woman with at least 13 brain metastases suspected related to history of left breast UOQ IDC.    ICD-10-CM   1. Gaze palsy H51.0   2. Cancer ([place]) C80.1 US THORACENTESIS ASP PLEURAL SPACE W/IMG GUIDE    US THORACENTESIS ASP PLEURAL SPACE W/IMG GUIDE    HISTORY OF PRESENT ILLNESS: CATHREN SWEEN is a 58 y.o. female seen at the request of Dr. Cristal Ford.  She has a history of stage IIA, ER+, PR-, HER2+ grade 3 invasive ductal carcinoma with DCIS of the left breast, diagnosed in 2018. She was initially treated with neoadjuvant chemotherapy and maintenance Herceptin under the care and direction of [name] followed by a left breast lumpectomy with radioactive seed and left axillary sentinel lymph node biopsy by Dr. Lucia Gaskins on 10/09/16.  Lumpectomy confirmed invasive ductal carcinoma, grade 3, spanning 2.4 cm with high grade ductal carcinoma in situ with necrosis, and negative resection margins. Left axillary lymph node showed metastatic carcinoma in one lymph node (1/1).   She therefore proceeded with adjuvant radiation to the left breast and axilla which was completed in 12/2016 under the care and direction of Dr. Sondra Come.  She was started on Xeloda 11/22/16, concurrent with XRT which she tolerated well.  She continued on Xeloda in addition to Herceptin and perjeta for a total of 6 months (completed 04/2017) and is currently on anastrozole- started 06/28/17, under the care and direction of [name].  She presented to the emergency department on 01/26/2018 with sudden onset difficulty with vision and inability to look to her left with blurred vision every time she attempted to move her eyes  towards the midline or look left.  An MRI of the brain without contrast was initially performed and showed small scattered acute infarcts in the bilateral cerebellum, right cerebellum, left posterior pons with a pattern suggesting embolic disease.  The pontine infarct was noted near the facial colliculus and correlated with the visual complaints.  Neurology was consulted and requested further imaging and work-up.  A CTA head and neck showed malignant appearing adenopathy in the chest and left lower neck, presumably related to the patient's history of breast cancer.  There was bilateral upper lobe subpleural nodularity suggesting lymphatic spread of tumor and moderate right pleural effusion.  Given the concern for metastatic disease, a CT of the chest abdomen and pelvis was performed on 01/28/2018 unfortunately confirming the interval development of metastatic disease including mediastinal and right hilar adenopathy, bilateral pulmonary nodules, right pleural effusion, hepatic metastatic disease and splenic metastatic disease.  MRI of the brain with contrast was performed on 01/28/2018 as well and this demonstrated at least 14 focal enhancing lesions in the cerebrum and cerebellum.  The largest lesion is in the anterior right frontal lobe measuring 5.5 mm.  These lesions are separate from the previously seen foci of restricted diffusion and likely represent diffuse metastases.  There was no enhancement associated with the areas of restricted diffusion previously seen and these were felt most likely separate areas of embolic infarct.  We are asked to consult for consideration of the potential role of radiotherapy for treatment of her metastatic brain disease.  PREVIOUS RADIATION THERAPY: yes-  11/22/2016 -  01/10/2017:    1. The patient received 50.4Gy in 57factions to the left breast.  2. The axillary region received 45 Gy in 25 fractions.  3. The patient then proceeded with a 10 Gy boost to the lumpectomy  cavity in 5 fractions for a cumulative dose to the lumpectomy cavity of 60.4 Gy.  PAST MEDICAL HISTORY:  Past Medical History:  Diagnosis Date  . Arthritis   . Asthma    triggered with EMindi Curlingperfumes and cigarette smoke  . [place]   . Colitis   . Diabetes mellitus without complication ([facility]   . History of radiation therapy 11/22/16-01/10/17   left breast 50.4 Gy in 28 fractions, axillary region 45 Gy in 25 fractions, lumpectomy cavity boost 10 Gy tin 5 fractions  . Hypertension   . Neuropathy       PAST SURGICAL HISTORY: Past Surgical History:  Procedure Laterality Date  . ABDOMINAL HYSTERECTOMY    . BREAST LUMPECTOMY WITH RADIOACTIVE SEED AND SENTINEL LYMPH NODE BIOPSY Left 10/09/2016   Procedure: LEFT BREAST LUMPECTOMY WITH RADIOACTIVE SEED AND L4EFT AXILLARY SENTINEL LYMPH NODE BIOPSY;  Surgeon: NAlphonsa Overall MD;  [place] [facility]  Service: General;  Laterality: Left;  . DILATION AND CURETTAGE OF UTERUS    . KNEE ARTHROSCOPY Left   . PORTACATH PLACEMENT Right 05/08/2016   Procedure: INSERTION PORT-A-CATH WITH [place]  Surgeon: DAlphonsa Overall MD;  [facility]  Service: General;  Laterality: Right;  . TONSILLECTOMY    . TOTAL HIP ARTHROPLASTY Right   . TOTAL HIP ARTHROPLASTY Left 03/06/2016   Procedure: LEFT TOTAL HIP ARTHROPLASTY ANTERIOR APPROACH;  Surgeon: MParalee Cancel MD;  Location: [facility];  Service: Orthopedics;  Laterality: Left;    FAMILY HISTORY:  Family History  Problem Relation Age of Onset  . Colon cancer Father   . Stomach cancer Paternal Uncle   . Stomach cancer Paternal Uncle   . Melanoma Brother   . Thyroid cancer Brother   . Breast cancer Maternal Aunt   . Breast cancer Maternal Aunt   . Breast cancer Cousin     SOCIAL HISTORY:  Social History   Socioeconomic History  . Marital status: Married    Spouse name: Not on file  . Number of children: 0  . Years of education: Not on file  . Highest  education level: Not on file  Occupational History    Employer: [employer] . Financial resource strain: Not on file  . Food insecurity:    Worry: Not on file    Inability: Not on file  . Transportation needs:    Medical: Not on file    Non-medical: Not on file  Tobacco Use  . Smoking status: Former Smoker    Packs/day: 1.00    Years: 29.00    Pack years: 29.00    Types: Cigarettes    Last attempt to quit: 10/27/2002    Years since quitting: 15.2  . Smokeless tobacco: Never Used  Substance and Sexual Activity  . Alcohol use: No    Alcohol/week: 0.0 standard drinks  . Drug use: No  . Sexual activity: Yes    Birth control/protection: Surgical  Lifestyle  . Physical activity:    Days per week: Not on file    Minutes per session: Not on file  . Stress: Not on file  Relationships  . Social connections:    Talks on phone: Not on file    Gets together:  Not on file    Attends religious service: Not on file    Active member of club or organization: Not on file    Attends meetings of clubs or organizations: Not on file    Relationship status: Not on file  . Intimate partner violence:    Fear of current or ex partner: Not on file    Emotionally abused: Not on file    Physically abused: Not on file    Forced sexual activity: Not on file  Other Topics Concern  . Not on file  Social History Narrative   Married   Former smoker   No ETOH, drug use    ALLERGIES: Gadavist [gadobutrol]; Lisinopril; Augmentin [amoxicillin-pot clavulanate]; Benadryl [diphenhydramine]; and Losartan potassium  MEDICATIONS:  Current Facility-Administered Medications  Medication Dose Route Frequency Provider Last Rate Last Dose  . 0.9 %  sodium chloride infusion   Intravenous Continuous Cristal Ford, DO 75 mL/hr at 01/28/18 2213    . acetaminophen (TYLENOL) tablet 650 mg  650 mg Oral Q4H PRN Cristal Ford, DO   650 mg at 01/27/18 1339   Or  . acetaminophen (TYLENOL) solution 650  mg  650 mg Per Tube Q4H PRN Cristal Ford, DO       Or  . acetaminophen (TYLENOL) suppository 650 mg  650 mg Rectal Q4H PRN Cristal Ford, DO      . acidophilus (RISAQUAD) capsule 1 capsule  1 capsule Oral Daily Cristal Ford, DO   1 capsule at 01/29/18 7169  . albuterol (PROVENTIL) (2.5 MG/3ML) 0.083% nebulizer solution 2.5 mg  2.5 mg Nebulization Q6H PRN Cristal Ford, DO      . anastrozole (ARIMIDEX) tablet 1 mg  1 mg Oral Daily Cristal Ford, DO   1 mg at 01/28/18 2044  . aspirin EC tablet 325 mg  325 mg Oral Daily Cristal Ford, DO   325 mg at 01/29/18 6789  . dicyclomine (BENTYL) tablet 20 mg  20 mg Oral TID PRN Cristal Ford, DO      . enoxaparin (LOVENOX) injection 40 mg  40 mg Subcutaneous Q24H Mikhail, [place], DO   40 mg at 01/29/18 1354  . gabapentin (NEURONTIN) capsule 200 mg  200 mg Oral QHS Mikhail, [place], DO   200 mg at 01/28/18 2258  . guaiFENesin (ROBITUSSIN) 100 MG/5ML solution 100 mg  5 mL Oral Q4H PRN Cristal Ford, DO   100 mg at 01/28/18 2309  . insulin aspart (novoLOG) injection 0-9 Units  0-9 Units Subcutaneous TID WC Mikhail, Velta Addison, DO   1 Units at 01/29/18 1353  . loperamide (IMODIUM) capsule 4 mg  4 mg Oral PRN Cristal Ford, DO   4 mg at 01/28/18 0007  . LORazepam (ATIVAN) injection 0.5 mg  0.5 mg Intravenous Q4H PRN Charlynne Cousins, MD      . menthol-cetylpyridinium (CEPACOL) lozenge 3 mg  1 lozenge Oral PRN Cristal Ford, DO   3 mg at 01/26/18 1639  . Mesalamine (ASACOL) DR capsule 800 mg  800 mg Oral BID Cristal Ford, DO   800 mg at 01/29/18 1202  . phenol (CHLORASEPTIC) mouth spray 1 spray  1 spray Mouth/Throat PRN Cristal Ford, DO       Facility-Administered Medications Ordered in Other Encounters  Medication Dose Route Frequency Provider Last Rate Last Dose  . heparin lock flush 100 unit/mL  500 Units Intracatheter Once PRN Truitt Merle, MD      . sodium chloride flush (NS) 0.9 % injection 10 mL  10 mL Intracatheter  PRN Truitt Merle, MD      . sodium chloride flush (NS) 0.9 % injection 10 mL  10 mL Intracatheter PRN Truitt Merle, MD   10 mL at 08/24/16 1725  . sodium chloride flush (NS) 0.9 % injection 10 mL  10 mL Intracatheter PRN Truitt Merle, MD   10 mL at 11/16/16 1628    REVIEW OF SYSTEMS:  On review of systems, the patient reports that she is doing well overall.  She denies any chest pain, shortness of breath, cough, fevers, chills, night sweats, or recent unintended weight changes.  Her only complaint is of the visual disturbance with inability to move her eyes to the left and some blurred/double vision.  She denies headaches, imbalance aside from that associated with the blurred vision, focal weakness in the upper or lower extremities or seizure activity.  She denies any bowel or bladder disturbances, and denies abdominal pain, nausea or vomiting.  She denies any new musculoskeletal or joint aches or pains. A complete review of systems is obtained and is otherwise negative.    PHYSICAL EXAM:  Wt Readings from Last 3 Encounters:  01/28/18 175 lb 3.2 oz (79.5 kg)  01/24/18 173 lb 6.4 oz (78.7 kg)  10/08/17 184 lb (83.5 kg)   Temp Readings from Last 3 Encounters:  01/29/18 99 F (37.2 C) (Oral)  01/24/18 (!) 97.3 F (36.3 C) (Oral)  11/29/17 98.3 F (36.8 C) (Oral)   BP Readings from Last 3 Encounters:  01/29/18 135/84  01/24/18 130/74  11/29/17 111/71   Pulse Readings from Last 3 Encounters:  01/29/18 (!) 114  01/24/18 73  11/29/17 74   Pain Assessment Pain Score: 0-No pain/10  In general this is a well appearing Caucasian female in no acute distress.  She is alert and oriented x4 and appropriate throughout the examination. HEENT reveals that the patient is normocephalic, atraumatic. EOMs are intact with the exception of ability to abduct on the left. PERRLA.  Sensation is intact to light touch in bilateral upper and lower extremities.  Strength is 5 out of 5 and equal in all 4 extremities.   Skin is intact without any evidence of gross lesions. Cardiovascular exam reveals a regular rate and rhythm, no clicks rubs or murmurs are auscultated. Chest is clear to auscultation bilaterally. Lymphatic assessment is performed and does not reveal any palpable adenopathy in the cervical, supraclavicular, or axillary chains. Abdomen has active bowel sounds in all quadrants and is intact. The abdomen is soft, non tender, non distended. Lower extremities are negative for pretibial pitting edema, deep calf tenderness, cyanosis or clubbing.  KPS = 90  100 - Normal; no complaints; no evidence of disease. 90   - Able to carry on normal activity; minor signs or symptoms of disease. 80   - Normal activity with effort; some signs or symptoms of disease. 78   - Cares for self; unable to carry on normal activity or to do active work. 60   - Requires occasional assistance, but is able to care for most of his personal needs. 50   - Requires considerable assistance and frequent medical care. 63   - Disabled; requires special care and assistance. 86   - Severely disabled; hospital admission is indicated although death not imminent. 74   - Very sick; hospital admission necessary; active supportive treatment necessary. 10   - Moribund; fatal processes progressing rapidly. 0     - Dead  Karnofsky DA, Abelmann [place], Craver LS and Burchenal [facility] (  1948) The use of the nitrogen mustards in the palliative treatment of carcinoma: with particular reference to bronchogenic carcinoma Cancer 1 634-56  LABORATORY DATA:  Lab Results  Component Value Date   WBC 4.7 01/28/2018   HGB 11.5 (L) 01/28/2018   HCT 37.5 01/28/2018   MCV 83.5 01/28/2018   PLT 229 01/28/2018   Lab Results  Component Value Date   NA 140 01/28/2018   K 4.0 01/28/2018   CL 105 01/28/2018   CO2 25 01/28/2018   Lab Results  Component Value Date   ALT 15 01/26/2018   AST 20 01/26/2018   ALKPHOS 85 01/26/2018   BILITOT 0.4 01/26/2018       RADIOGRAPHY: Ct Angio Head W Or Wo Contrast  Result Date: 01/27/2018 CLINICAL DATA:  Stroke follow-up EXAM: CT ANGIOGRAPHY HEAD AND NECK TECHNIQUE: Multidetector CT imaging of the head and neck was performed using the standard protocol during bolus administration of intravenous contrast. Multiplanar CT image reconstructions and MIPs were obtained to evaluate the vascular anatomy. Carotid stenosis measurements (when applicable) are obtained utilizing NASCET criteria, using the distal internal carotid diameter as the denominator. CONTRAST:  126m ISOVUE-370 IOPAMIDOL (ISOVUE-370) INJECTION 76% COMPARISON:  Head CT and brain MRI from yesterday FINDINGS: CTA NECK FINDINGS Aortic arch: Normal appearance Right carotid system: Vessels are smooth and widely patent Left carotid system: Vessels are smooth and widely patent. No noted atheromatous changes Vertebral arteries: Vessels are smooth and widely patent. Early origin left PICA which branches from the V3 segment. Skeleton: Degenerative disease without acute finding Other neck: Enlarged lymph nodes in the low left posterior triangle and supraclavicular fossa. Incidental right thyroid nodule Upper chest: There is worrisome, centrally low-dense adenopathy in the bilateral mediastinum, thickest at the level of the subcarinal space where there is a 2 cm diameter. Layering right pleural effusion, at least moderate. There is subpleural nodularity and both upper lobes. Review of the MIP images confirms the above findings CTA HEAD FINDINGS Anterior circulation: Vessels are smooth and widely patent Posterior circulation: Vessels are smooth and widely patent. Hypoplastic left P1 segment Venous sinuses: Patent Anatomic variants: As above Delayed phase: No abnormal intracranial enhancement Review of the MIP images confirms the above findings IMPRESSION: 1. Malignant-appearing adenopathy in the chest and left lower neck presumably related patient's history of breast cancer.  Bilateral upper lobe subpleural nodularity suggesting lymphatic spread of tumor. Moderate right pleural effusion. 2. No significant vascular finding. Electronically Signed   By: JMonte FantasiaM.D.   On: 01/27/2018 09:59   Ct Head Wo Contrast  Result Date: 01/26/2018 CLINICAL DATA:  Acute onset of altered level of consciousness. Dizziness and double vision. Eyes drifting to the right. EXAM: CT HEAD WITHOUT CONTRAST TECHNIQUE: Contiguous axial images were obtained from the base of the skull through the vertex without intravenous contrast. COMPARISON:  MRI of the brain performed 11/28/2017 FINDINGS: Brain: No evidence of acute infarction, hemorrhage, hydrocephalus, extra-axial collection or mass lesion/mass effect. The posterior fossa, including the cerebellum, brainstem and fourth ventricle, is within normal limits. The third and lateral ventricles, and basal ganglia are unremarkable in appearance. The cerebral hemispheres are symmetric in appearance, with normal gray-white differentiation. No mass effect or midline shift is seen. Vascular: No hyperdense vessel or unexpected calcification. Skull: There is no evidence of fracture; visualized osseous structures are unremarkable in appearance. Sinuses/Orbits: There is deviation of gaze to the right. The orbits are otherwise unremarkable. The paranasal sinuses and mastoid air cells are well-aerated. Other: No significant  soft tissue abnormalities are seen. IMPRESSION: 1. No acute intracranial pathology seen on CT. 2. Deviation of gaze to the right, as clinically described. Electronically Signed   By: Garald Balding M.D.   On: 01/26/2018 01:56   Ct Angio Neck W Or Wo Contrast  Result Date: 01/27/2018 CLINICAL DATA:  Stroke follow-up EXAM: CT ANGIOGRAPHY HEAD AND NECK TECHNIQUE: Multidetector CT imaging of the head and neck was performed using the standard protocol during bolus administration of intravenous contrast. Multiplanar CT image reconstructions and MIPs  were obtained to evaluate the vascular anatomy. Carotid stenosis measurements (when applicable) are obtained utilizing NASCET criteria, using the distal internal carotid diameter as the denominator. CONTRAST:  168m ISOVUE-370 IOPAMIDOL (ISOVUE-370) INJECTION 76% COMPARISON:  Head CT and brain MRI from yesterday FINDINGS: CTA NECK FINDINGS Aortic arch: Normal appearance Right carotid system: Vessels are smooth and widely patent Left carotid system: Vessels are smooth and widely patent. No noted atheromatous changes Vertebral arteries: Vessels are smooth and widely patent. Early origin left PICA which branches from the V3 segment. Skeleton: Degenerative disease without acute finding Other neck: Enlarged lymph nodes in the low left posterior triangle and supraclavicular fossa. Incidental right thyroid nodule Upper chest: There is worrisome, centrally low-dense adenopathy in the bilateral mediastinum, thickest at the level of the subcarinal space where there is a 2 cm diameter. Layering right pleural effusion, at least moderate. There is subpleural nodularity and both upper lobes. Review of the MIP images confirms the above findings CTA HEAD FINDINGS Anterior circulation: Vessels are smooth and widely patent Posterior circulation: Vessels are smooth and widely patent. Hypoplastic left P1 segment Venous sinuses: Patent Anatomic variants: As above Delayed phase: No abnormal intracranial enhancement Review of the MIP images confirms the above findings IMPRESSION: 1. Malignant-appearing adenopathy in the chest and left lower neck presumably related patient's history of breast cancer. Bilateral upper lobe subpleural nodularity suggesting lymphatic spread of tumor. Moderate right pleural effusion. 2. No significant vascular finding. Electronically Signed   By: JMonte FantasiaM.D.   On: 01/27/2018 09:59   Ct Chest W Contrast  Result Date: 01/28/2018 CLINICAL DATA:  History of breast cancer. Evaluate for metastatic  disease. EXAM: CT CHEST, ABDOMEN, AND PELVIS WITH CONTRAST TECHNIQUE: Multidetector CT imaging of the chest, abdomen and pelvis was performed following the standard protocol during bolus administration of intravenous contrast. CONTRAST:  1028mOMNIPAQUE IOHEXOL 300 MG/ML  SOLN COMPARISON:  Chest CT 05/13/2016; abdomen pelvic CT 05/31/2017 FINDINGS: CT CHEST FINDINGS Cardiovascular: Normal heart size.  Trace pericardial effusion. Mediastinum/Nodes: Interval development of mediastinal and right hilar adenopathy. Reference 2.1 cm right paratracheal lymph node (image 17; series 3). Reference 2.5 cm subcarinal node (image 25; series 3). Reference 2.6 x 4.2 cm right pericardiophrenic mass (image 31; series 3). There is a 1.5 cm left supraclavicular lymph node (image 7; series 3). Lungs/Pleura: Irregular right hilar soft tissue measures 3.4 x 3.2 cm compatible with right hilar adenopathy. This markedly narrows the right middle and lower lobe bronchi. New moderate right pleural effusion. Possible enhancing pleural nodularity within the inferior right pleural space (image 41; series 3). Interval development of multiple bilateral pulmonary nodules. Reference 1.4 cm nodule in the lingula (image 91; series 5). Reference 0.4 cm nodule within the left lower lobe (image 104; series 5). Multiple right upper lobe subpleural nodules measuring up to 5 mm (image 46; series 5). No pneumothorax. Musculoskeletal: Thoracic spine degenerative changes. No aggressive or acute appearing osseous lesions. Postsurgical changes left breast. CT ABDOMEN PELVIS  FINDINGS Hepatobiliary: Interval development of a 5.2 x 4.2 cm low-attenuation mass within the right hepatic lobe (image 41; series 3). There is an adjacent 1.5 cm low-attenuation nodule (image 39; series 3). New 2.5 x 1.6 cm low-attenuation mass right hepatic lobe (image 51; series 3). Additional small low-attenuation lesions are demonstrated within the liver. Gallbladder is unremarkable. No  intrahepatic or extrahepatic biliary ductal dilatation. Pancreas: Unremarkable Spleen: Interval development of a 2.2 x 1.8 cm low-attenuation mass within the spleen (image 52; series 3). Adrenals/Urinary Tract: Stable small left adrenal nodule. Small right adrenal myelolipoma. Kidneys enhance symmetrically with contrast. No hydronephrosis. Streak artifact limits evaluation of the pelvis. Stomach/Bowel: Re demonstrated circumferential wall thickening of the sigmoid colon and rectum. Small pericolonic lymph nodes are demonstrated. Normal appendix. Vascular/Lymphatic: Normal caliber abdominal aorta. No retroperitoneal lymphadenopathy. Reproductive: Streak artifact limits evaluation of the pelvis. Other: None. Musculoskeletal: Bilateral hip arthroplasties. Stable patchy sclerosis left ilium lumbar spine degenerative changes. No aggressive or acute appearing osseous lesions. IMPRESSION: 1. Interval development of metastatic disease including mediastinal and right hilar adenopathy, bilateral pulmonary nodules, right pleural effusion with suggestion of peritoneal nodularity, hepatic metastatic disease and splenic metastatic disease. 2. Re demonstrated wall thickening of the sigmoid colon and rectum which may be secondary to an inflammatory process. Electronically Signed   By: Lovey Newcomer M.D.   On: 01/28/2018 16:53   Mr Brain Wo Contrast  Result Date: 01/26/2018 CLINICAL DATA:  Blurred vision and difficulty walking EXAM: MRI HEAD WITHOUT CONTRAST TECHNIQUE: Multiplanar, multiecho pulse sequences of the brain and surrounding structures were obtained without intravenous contrast. COMPARISON:  Head CT from earlier today FINDINGS: Brain: Subcentimeter foci of restricted diffusion along the left para median floor of the fourth ventricle (near the facial colliculus), right cerebellum, bilateral occipital cortex, and bilateral posterior frontal cortex, and right centrum semiovale. No prior ischemic injury is noted. No  hemorrhage, hydrocephalus, or masslike finding Vascular: Major flow voids are preserved Skull and upper cervical spine: Negative for marrow lesion Sinuses/Orbits: Negative IMPRESSION: Small scattered acute infarcts in the bilateral cerebrum, right cerebellum, and left posterior pons. The pontine infarct is near the facial colliculus and correlates with the visual complaints. The pattern suggests central embolic disease. Electronically Signed   By: Monte Fantasia M.D.   On: 01/26/2018 12:20   Mr Brain W Contrast  Result Date: 01/28/2018 CLINICAL DATA:  Invasive breast cancer, stage IV, recurrence. Abnormal MRI the brain. Blurred vision and difficulty walking. EXAM: MRI HEAD WITH CONTRAST TECHNIQUE: Multiplanar, multiecho pulse sequences of the brain and surrounding structures were obtained with intravenous contrast. CONTRAST:  8 mL Gadavist COMPARISON:  MRI brain 01/26/2018. CTA of the head and neck 01/27/2018 FINDINGS: Brain: A 4 mm enhancing lesion present posteriorly in the right cerebellum on image 17 of series 3. Two punctate foci of enhancement are present in the anterior inferior left cerebellum on image 14 of series 3. Two additional punctate foci are present at the inferior aspect of the cerebellum bilaterally on image 11 of series 3. A punctate enhancing lesion is present at the inferior left temporal lobe on image 22 of series 3. A lesion in the left parietal white matter is best seen on images 36 and 37 of series 3. Cortical and subcortical punctate a lesions are present in the left parietal lobe images 42 and 41. An anterior right frontal lobe 3 mm enhancing nodule is present on image 43 of series 3. A linear cortical lesion measures 5.5 mm in the anterior right  frontal lobe on image 41 of series 3. 3 mm lesion is present the anterior right frontal lobe white matter on image 38. A subcortical white matter lesion in the right occipital lobe measures 3 mm on image 32. The foci of enhancement are  separate from the areas of restricted diffusion. The ventricles are of normal size. No significant extra-axial fluid collection is present. Vascular: Normal vascular enhancement is present Skull and upper cervical spine: Craniocervical junction is normal. Discrete osseous lesions are present. The upper cervical spine is within normal limits. Sinuses/Orbits: The paranasal sinuses and mastoid air cells are clear. IMPRESSION: 1. At least 14 focal enhancing lesions are present in the cerebrum and cerebellum. The largest lesion is in the anterior right frontal lobe measuring 5.5 mm. These are separate from the previously seen foci of restricted diffusion and likely represent diffuse metastases. 2. No enhancement associated with the areas of restricted diffusion previously seen. These are likely separate areas of embolic infarcts. Electronically Signed   By: San Morelle M.D.   On: 01/28/2018 11:07   Ct Abdomen Pelvis W Contrast  Result Date: 01/28/2018 CLINICAL DATA:  History of breast cancer. Evaluate for metastatic disease. EXAM: CT CHEST, ABDOMEN, AND PELVIS WITH CONTRAST TECHNIQUE: Multidetector CT imaging of the chest, abdomen and pelvis was performed following the standard protocol during bolus administration of intravenous contrast. CONTRAST:  179m OMNIPAQUE IOHEXOL 300 MG/ML  SOLN COMPARISON:  Chest CT 05/13/2016; abdomen pelvic CT 05/31/2017 FINDINGS: CT CHEST FINDINGS Cardiovascular: Normal heart size.  Trace pericardial effusion. Mediastinum/Nodes: Interval development of mediastinal and right hilar adenopathy. Reference 2.1 cm right paratracheal lymph node (image 17; series 3). Reference 2.5 cm subcarinal node (image 25; series 3). Reference 2.6 x 4.2 cm right pericardiophrenic mass (image 31; series 3). There is a 1.5 cm left supraclavicular lymph node (image 7; series 3). Lungs/Pleura: Irregular right hilar soft tissue measures 3.4 x 3.2 cm compatible with right hilar adenopathy. This markedly  narrows the right middle and lower lobe bronchi. New moderate right pleural effusion. Possible enhancing pleural nodularity within the inferior right pleural space (image 41; series 3). Interval development of multiple bilateral pulmonary nodules. Reference 1.4 cm nodule in the lingula (image 91; series 5). Reference 0.4 cm nodule within the left lower lobe (image 104; series 5). Multiple right upper lobe subpleural nodules measuring up to 5 mm (image 46; series 5). No pneumothorax. Musculoskeletal: Thoracic spine degenerative changes. No aggressive or acute appearing osseous lesions. Postsurgical changes left breast. CT ABDOMEN PELVIS FINDINGS Hepatobiliary: Interval development of a 5.2 x 4.2 cm low-attenuation mass within the right hepatic lobe (image 41; series 3). There is an adjacent 1.5 cm low-attenuation nodule (image 39; series 3). New 2.5 x 1.6 cm low-attenuation mass right hepatic lobe (image 51; series 3). Additional small low-attenuation lesions are demonstrated within the liver. Gallbladder is unremarkable. No intrahepatic or extrahepatic biliary ductal dilatation. Pancreas: Unremarkable Spleen: Interval development of a 2.2 x 1.8 cm low-attenuation mass within the spleen (image 52; series 3). Adrenals/Urinary Tract: Stable small left adrenal nodule. Small right adrenal myelolipoma. Kidneys enhance symmetrically with contrast. No hydronephrosis. Streak artifact limits evaluation of the pelvis. Stomach/Bowel: Re demonstrated circumferential wall thickening of the sigmoid colon and rectum. Small pericolonic lymph nodes are demonstrated. Normal appendix. Vascular/Lymphatic: Normal caliber abdominal aorta. No retroperitoneal lymphadenopathy. Reproductive: Streak artifact limits evaluation of the pelvis. Other: None. Musculoskeletal: Bilateral hip arthroplasties. Stable patchy sclerosis left ilium lumbar spine degenerative changes. No aggressive or acute appearing osseous lesions. IMPRESSION:  1. Interval  development of metastatic disease including mediastinal and right hilar adenopathy, bilateral pulmonary nodules, right pleural effusion with suggestion of peritoneal nodularity, hepatic metastatic disease and splenic metastatic disease. 2. Re demonstrated wall thickening of the sigmoid colon and rectum which may be secondary to an inflammatory process. Electronically Signed   By: Lovey Newcomer M.D.   On: 01/28/2018 16:53   Vas [place] Lower Extremity Venous (dvt)  Result Date: 01/27/2018  Lower Venous Study Indications: Stroke.  Performing Technologist: Abram Sander RVS  Examination Guidelines: A complete evaluation includes B-mode imaging, spectral Doppler, color Doppler, and power Doppler as needed of all accessible portions of each vessel. Bilateral testing is considered an integral part of a complete examination. Limited examinations for reoccurring indications may be performed as noted.  Right Venous Findings: +---------+---------------+---------+-----------+----------+-------+          CompressibilityPhasicitySpontaneityPropertiesSummary +---------+---------------+---------+-----------+----------+-------+ CFV      Full           Yes      Yes                          +---------+---------------+---------+-----------+----------+-------+ SFJ      Full                                                 +---------+---------------+---------+-----------+----------+-------+ FV Prox  Full                                                 +---------+---------------+---------+-----------+----------+-------+ FV Mid   Full                                                 +---------+---------------+---------+-----------+----------+-------+ FV DistalFull                                                 +---------+---------------+---------+-----------+----------+-------+ PFV      Full                                                  +---------+---------------+---------+-----------+----------+-------+ POP      Full           Yes      Yes                          +---------+---------------+---------+-----------+----------+-------+ PTV      Full                                                 +---------+---------------+---------+-----------+----------+-------+ PERO     Full                                                 +---------+---------------+---------+-----------+----------+-------+  Left Venous Findings: +---------+---------------+---------+-----------+----------+-------+          CompressibilityPhasicitySpontaneityPropertiesSummary +---------+---------------+---------+-----------+----------+-------+ CFV      Full           Yes      Yes                          +---------+---------------+---------+-----------+----------+-------+ SFJ      Full                                                 +---------+---------------+---------+-----------+----------+-------+ FV Prox  Full                                                 +---------+---------------+---------+-----------+----------+-------+ FV Mid   Full                                                 +---------+---------------+---------+-----------+----------+-------+ FV DistalFull                                                 +---------+---------------+---------+-----------+----------+-------+ PFV      Full                                                 +---------+---------------+---------+-----------+----------+-------+ POP      Full           Yes      Yes                          +---------+---------------+---------+-----------+----------+-------+ PTV      Full                                                 +---------+---------------+---------+-----------+----------+-------+ PERO     Full                                                  +---------+---------------+---------+-----------+----------+-------+    Summary: Right: There is no evidence of deep vein thrombosis in the lower extremity. No cystic structure found in the popliteal fossa. Left: There is no evidence of deep vein thrombosis in the lower extremity. No cystic structure found in the popliteal fossa.  *See table(s) above for measurements and observations. Electronically signed by Deitra Mayo MD on 01/27/2018 at 5:13:11 PM.    Final       IMPRESSION/PLAN: 81. 58 y.o. newly diagnosed diffuse metastatic disease, most likely related to her prior history of left breast cancer, with at least 14 metastatic brain lesions. I  personally reviewed her imaging and case extensively with Dr. Tammi Klippel prior to meeting with the patient in consult today. At this point, the patient would potentially benefit from radiotherapy for treatment of her metastatic brain disease. The options include whole brain irradiation versus stereotactic radiosurgery. There are pros and cons associated with each of these potential treatment options. Given the extent of her disease, she is felt to be a most appropriate candidate for whole brain radiotherapy, as this would treat all of the known metastatic deposits and help provide some reduction of risk for future brain metastases.   PLAN: Today, I reviewed the findings and workup thus far with the patient and her husband, [name]. We discussed the dilemma regarding whole brain radiotherapy versus stereotactic radiosurgery. We discussed the pros and cons of each. We also discussed the logistics and delivery of each. We reviewed the results associated with each of the treatments described above. The patient seems to understand the treatment options and agrees to proceed with whole brain radiotherapy as recommended.  She freely signed written consent to proceed and a copy of this document has been placed in her medical record.  She is tentatively scheduled for CT  SIM/port and treat, on Thursday 01/30/18 at 1pm in anticipation of beginning her treatment later that same day.  We anticipate a 2-week course of radiotherapy delivered over 10 daily treatments.  I have communicated with Dr. Ree Kida and Dr. Erlinda Hong who agree that the patient is stable for transfer over to [facility] long hospital in an effort to ease the start of her radiotherapy.  I greatly appreciate Dr. Aldine Contes willingness to accommodate the request for transfer and feel that this will most certainly work to the patient's best interest in expediting the start of her radiotherapy.  Dr. Ree Kida is also hoping to coordinate for the patient to have a thoracentesis on Thursday, 01/30/2018 for further pathology assessment to confirm origin of disease and further direct systemic chemotherapy options/recommendations.  The patient and her husband appear to have a good understanding of her disease and our current recommendations and are in agreement with the stated plan.  I spent 60 minutes minutes face to face with the patient and more than 50% of that time was spent in counseling and/or coordination of care.    Nicholos Johns, PA-C    Tyler Pita, MD  [facility] Direct Dial: [phone]  Fax: [phone] [email or web address]  Skype  LinkedIn

## 2018-01-30 ENCOUNTER — Ambulatory Visit
Admit: 2018-01-30 | Discharge: 2018-01-30 | Disposition: A | Discharge status: Home / Self Care | Payer: 59 | Attending: Radiation Oncology | Admitting: Radiation Oncology

## 2018-01-30 ENCOUNTER — Inpatient Hospital Stay (HOSPITAL_COMMUNITY): Payer: 59

## 2018-01-30 DIAGNOSIS — C801 Malignant (primary) neoplasm, unspecified: Secondary | ICD-10-CM

## 2018-01-30 DIAGNOSIS — C50912 Malignant neoplasm of unspecified site of left female breast: Secondary | ICD-10-CM

## 2018-01-30 DIAGNOSIS — Z7189 Other specified counseling: Secondary | ICD-10-CM

## 2018-01-30 DIAGNOSIS — C7931 Secondary malignant neoplasm of brain: Secondary | ICD-10-CM

## 2018-01-30 DIAGNOSIS — C799 Secondary malignant neoplasm of unspecified site: Secondary | ICD-10-CM

## 2018-01-30 LAB — GLUCOSE, CAPILLARY
Glucose-Capillary: 93 mg/dL (ref 70–99)
Glucose-Capillary: 92 mg/dL (ref 70–99)
Glucose-Capillary: 117 mg/dL — ABNORMAL HIGH (ref 70–99)
Glucose-Capillary: 53 mg/dL — ABNORMAL LOW (ref 70–99)
Glucose-Capillary: 99 mg/dL (ref 70–99)

## 2018-01-30 LAB — PROTIME-INR
INR: 0.99
Prothrombin Time: 13 seconds (ref 11.4–15.2)

## 2018-01-30 MED ORDER — LIDOCAINE HCL 1 % IJ SOLN
INTRAMUSCULAR | Status: AC
  Filled 2018-01-30: qty 20

## 2018-01-30 MED ORDER — ENOXAPARIN SODIUM 40 MG/0.4ML ~~LOC~~ SOLN: 40.0000 mg | SUBCUTANEOUS | Status: DC

## 2018-01-30 NOTE — Progress Notes (Signed)
PROGRESS NOTE                                                                                                                                                                                                             Patient Demographics:    Brandi Dickson, is a 58 y.o. female, DOB - 13-Sep-1960, PVV:[id number]  Admit date - 01/26/2018   Admitting Physician Shela Leff, MD  Outpatient Primary MD for the patient is Orpah Melter, MD  LOS - 3  Outpatient Specialists: Dr Burr Medico  Chief Complaint  Patient presents with  . Blurred Vision  . Dizziness       Brief Narrative   58 year old female with stage II breast cancer, peripheral neuropathy, diabetes mellitus type 2 who presented on 12/28 with impaired left lateral gaze.  MRI brain showed small scattered acute infarct in the bilateral cerebrum, right cerebellum and left posterior pons which explains her visual symptoms.  Oncology consulted for metastatic breast cancer.    Subjective:   Patient seen after returning from thoracentesis and radiation therapy.  Denies any pain or discomfort.   Assessment  & Plan :   Principal problem Bilateral punctuated CVA Likely hypercoagulable state from metastatic breast cancer.  MRI findings as above.  CT of the head and neck also showed malignant appearing adenopathy in the chest and left lower neck, bilateral lobe subpleural nodules Arity suggestive of lymphatics spread of tumor with moderate right pleural effusion.  Brain MRI with contrast showed at least 14 focal lesions suggestive of malignancy. After discussion with oncology she was started on Lovenox for therapeutic stroke prevention and then start on DO AC once no procedure planned.  Patient currently on full dose aspirin.  Will add Plavix once liver biopsy done. No focal neurological deficit except for left lateral gaze.  Home OT recommended.  Stage IV breast cancer  with liver and brain mets. CT scan abdomen pelvis with metastatic disease including mediastinal, right hilar adenopathy, bilateral pulmonary nodules, right pleural effusion and peritoneal nodularity with metastatic hepatic and splenic disease.   Started on radiation therapy today.  Continue Arimidex daily. Right-sided diagnostic and therapeutic thoracentesis done with 500 cc hazy amber fluid.  Tolerated well.  Follow-up chest x-ray without any pneumothorax.  Fluid sent for cytology. Discussed with Dr. Maylon Peppers who is covering for patient's primary oncologist [name].  She recommended that pleural fluid may not be adequate to give definitive cytology result and liver biopsy should be beneficial. I have placed an IR consult for liver biopsy, hopefully can be done tomorrow and discharge patient home after radiation therapy.  Patient is in clear denial and feels her illness will be curable.  She fired my partner yesterday because he told her she has stage IV  Malignancy.  Diabetes mellitus type 2, controlled A1c of 5.9.  Monitor on sliding scale coverage.  Essential hypertension Allowing permissive blood pressure  Asthma Stable.  Continue inhaler.   Code Status : Full code  Family Communication: Husband at bedside  Disposition Plan  : Home possibly tomorrow after liver biopsy and radiation therapy.  If liver biopsy cannot done tomorrow we will ask IR to schedule it for early next week as outpatient  Barriers For Discharge : If symptoms.  Pending liver biopsy  Consults  : Oncology, IR, stroke team  Procedures  : CT head, MRI brain with contrast, CT angiogram head and neck, CT abdomen pelvis, thoracentesis  DVT Prophylaxis  :  Lovenox -  Lab Results  Component Value Date   PLT 229 01/28/2018    Antibiotics  :    Anti-infectives (From admission, onward)   None        Objective:   Vitals:   01/29/18 2127 01/30/18 0600 01/30/18 1028 01/30/18 1044  BP: 120/83 117/80 134/74 118/87    Pulse: (!) 108 (!) 101    Resp: 17 16    Temp: 98.1 F (36.7 C) 98.4 F (36.9 C)    TempSrc: Oral Oral    SpO2: 99% 98%    Weight:      Height:        Wt Readings from Last 3 Encounters:  01/28/18 79.5 kg  01/24/18 78.7 kg  10/08/17 83.5 kg     Intake/Output Summary (Last 24 hours) at 01/30/2018 1244 Last data filed at 01/30/2018 0757 Gross per 24 hour  Intake 1632.52 ml  Output -  Net 1632.52 ml     Physical Exam  Gen: not in distress HEENT: Impaired left lateral gaze, no pallor, moist mucosa, supple neck Chest: Right thoracentesis site appears clean, diminished breath sounds on the right lobe CVS: N S1&S2, no murmurs, GI: soft, NT, ND, BS+ Musculoskeletal: warm, no edema CNS: Impaired left lateral gaze, alert and oriented, nonfocal    Data Review:    CBC Recent Labs  Lab 01/24/18 0753 01/26/18 0051 01/28/18 0431  WBC 7.4 7.8 4.7  HGB 12.9 12.1 11.5*  HCT 42.0 39.7 37.5  PLT 305 296 229  MCV 86.2 85.7 83.5  MCH 26.5 26.1 25.6*  MCHC 30.7 30.5 30.7  RDW 13.5 13.6 13.5  LYMPHSABS 1.7 1.7  --   MONOABS 0.6 0.6  --   EOSABS 0.2 0.2  --   BASOSABS 0.0 0.0  --     Chemistries  Recent Labs  Lab 01/24/18 0753 01/26/18 0051 01/28/18 0431  NA 140 138 140  K 4.0 3.7 4.0  CL 103 103 105  CO2 28 27 25   GLUCOSE 103* 120* 95  BUN 8 17 8   CREATININE 0.98 1.02* 0.84  CALCIUM 9.6 9.0 8.9  AST 20 20  --   ALT 15 15  --   ALKPHOS 106 85  --   BILITOT 0.3 0.4  --    ------------------------------------------------------------------------------------------------------------------ Recent Labs    01/28/18 0431  CHOL 190  HDL 41  LDLCALC 123*  TRIG 131  CHOLHDL 4.6    Lab Results  Component Value Date   HGBA1C 5.3 01/28/2018   ------------------------------------------------------------------------------------------------------------------ No results for input(s): TSH, T4TOTAL, T3FREE, THYROIDAB in the last 72 hours.  Invalid input(s):  FREET3 ------------------------------------------------------------------------------------------------------------------ No results for input(s): VITAMINB12, FOLATE, FERRITIN, TIBC, IRON, RETICCTPCT in the last 72 hours.  Coagulation profile Recent Labs  Lab 01/26/18 0051  INR 0.98    No results for input(s): DDIMER in the last 72 hours.  Cardiac Enzymes No results for input(s): CKMB, TROPONINI, MYOGLOBIN in the last 168 hours.  Invalid input(s): CK ------------------------------------------------------------------------------------------------------------------    Component Value Date/Time   BNP 21.1 05/13/2016 1431    Inpatient Medications  Scheduled Meds: . acidophilus  1 capsule Oral Daily  . anastrozole  1 mg Oral Daily  . aspirin EC  325 mg Oral Daily  . enoxaparin (LOVENOX) injection  40 mg Subcutaneous Q24H  . gabapentin  200 mg Oral QHS  . insulin aspart  0-9 Units Subcutaneous TID WC  . lidocaine      . Mesalamine  800 mg Oral BID   Continuous Infusions: . sodium chloride 75 mL/hr at 01/28/18 2213   PRN Meds:.acetaminophen **OR** acetaminophen (TYLENOL) oral liquid 160 mg/5 mL **OR** acetaminophen, albuterol, dicyclomine, guaiFENesin, loperamide, LORazepam, menthol-cetylpyridinium, phenol  Micro Results No results found for this or any previous visit (from the past 240 hour(s)).  Radiology Reports Ct Angio Head W Or Wo Contrast  Result Date: 01/27/2018 CLINICAL DATA:  Stroke follow-up EXAM: CT ANGIOGRAPHY HEAD AND NECK TECHNIQUE: Multidetector CT imaging of the head and neck was performed using the standard protocol during bolus administration of intravenous contrast. Multiplanar CT image reconstructions and MIPs were obtained to evaluate the vascular anatomy. Carotid stenosis measurements (when applicable) are obtained utilizing NASCET criteria, using the distal internal carotid diameter as the denominator. CONTRAST:  149m ISOVUE-370 IOPAMIDOL (ISOVUE-370)  INJECTION 76% COMPARISON:  Head CT and brain MRI from yesterday FINDINGS: CTA NECK FINDINGS Aortic arch: Normal appearance Right carotid system: Vessels are smooth and widely patent Left carotid system: Vessels are smooth and widely patent. No noted atheromatous changes Vertebral arteries: Vessels are smooth and widely patent. Early origin left PICA which branches from the V3 segment. Skeleton: Degenerative disease without acute finding Other neck: Enlarged lymph nodes in the low left posterior triangle and supraclavicular fossa. Incidental right thyroid nodule Upper chest: There is worrisome, centrally low-dense adenopathy in the bilateral mediastinum, thickest at the level of the subcarinal space where there is a 2 cm diameter. Layering right pleural effusion, at least moderate. There is subpleural nodularity and both upper lobes. Review of the MIP images confirms the above findings CTA HEAD FINDINGS Anterior circulation: Vessels are smooth and widely patent Posterior circulation: Vessels are smooth and widely patent. Hypoplastic left P1 segment Venous sinuses: Patent Anatomic variants: As above Delayed phase: No abnormal intracranial enhancement Review of the MIP images confirms the above findings IMPRESSION: 1. Malignant-appearing adenopathy in the chest and left lower neck presumably related patient's history of breast cancer. Bilateral upper lobe subpleural nodularity suggesting lymphatic spread of tumor. Moderate right pleural effusion. 2. No significant vascular finding. Electronically Signed   By: JMonte FantasiaM.D.   On: 01/27/2018 09:59   Dg Chest 1 View  Result Date: 01/30/2018 CLINICAL DATA:  Post right thoracentesis EXAM: CHEST  1 VIEW COMPARISON:  11/28/2017 FINDINGS: Small moderate right pleural effusion with right base atelectasis. Left lung clear. Heart is normal size. No pneumothorax following thoracentesis. IMPRESSION: Small  to moderate right pleural effusion with right lower lobe  atelectasis. No pneumothorax following thoracentesis. Electronically Signed   By: Rolm Baptise M.D.   On: 01/30/2018 11:19   Ct Head Wo Contrast  Result Date: 01/26/2018 CLINICAL DATA:  Acute onset of altered level of consciousness. Dizziness and double vision. Eyes drifting to the right. EXAM: CT HEAD WITHOUT CONTRAST TECHNIQUE: Contiguous axial images were obtained from the base of the skull through the vertex without intravenous contrast. COMPARISON:  MRI of the brain performed 11/28/2017 FINDINGS: Brain: No evidence of acute infarction, hemorrhage, hydrocephalus, extra-axial collection or mass lesion/mass effect. The posterior fossa, including the cerebellum, brainstem and fourth ventricle, is within normal limits. The third and lateral ventricles, and basal ganglia are unremarkable in appearance. The cerebral hemispheres are symmetric in appearance, with normal gray-white differentiation. No mass effect or midline shift is seen. Vascular: No hyperdense vessel or unexpected calcification. Skull: There is no evidence of fracture; visualized osseous structures are unremarkable in appearance. Sinuses/Orbits: There is deviation of gaze to the right. The orbits are otherwise unremarkable. The paranasal sinuses and mastoid air cells are well-aerated. Other: No significant soft tissue abnormalities are seen. IMPRESSION: 1. No acute intracranial pathology seen on CT. 2. Deviation of gaze to the right, as clinically described. Electronically Signed   By: Garald Balding M.D.   On: 01/26/2018 01:56   Ct Angio Neck W Or Wo Contrast  Result Date: 01/27/2018 CLINICAL DATA:  Stroke follow-up EXAM: CT ANGIOGRAPHY HEAD AND NECK TECHNIQUE: Multidetector CT imaging of the head and neck was performed using the standard protocol during bolus administration of intravenous contrast. Multiplanar CT image reconstructions and MIPs were obtained to evaluate the vascular anatomy. Carotid stenosis measurements (when applicable)  are obtained utilizing NASCET criteria, using the distal internal carotid diameter as the denominator. CONTRAST:  154m ISOVUE-370 IOPAMIDOL (ISOVUE-370) INJECTION 76% COMPARISON:  Head CT and brain MRI from yesterday FINDINGS: CTA NECK FINDINGS Aortic arch: Normal appearance Right carotid system: Vessels are smooth and widely patent Left carotid system: Vessels are smooth and widely patent. No noted atheromatous changes Vertebral arteries: Vessels are smooth and widely patent. Early origin left PICA which branches from the V3 segment. Skeleton: Degenerative disease without acute finding Other neck: Enlarged lymph nodes in the low left posterior triangle and supraclavicular fossa. Incidental right thyroid nodule Upper chest: There is worrisome, centrally low-dense adenopathy in the bilateral mediastinum, thickest at the level of the subcarinal space where there is a 2 cm diameter. Layering right pleural effusion, at least moderate. There is subpleural nodularity and both upper lobes. Review of the MIP images confirms the above findings CTA HEAD FINDINGS Anterior circulation: Vessels are smooth and widely patent Posterior circulation: Vessels are smooth and widely patent. Hypoplastic left P1 segment Venous sinuses: Patent Anatomic variants: As above Delayed phase: No abnormal intracranial enhancement Review of the MIP images confirms the above findings IMPRESSION: 1. Malignant-appearing adenopathy in the chest and left lower neck presumably related patient's history of breast cancer. Bilateral upper lobe subpleural nodularity suggesting lymphatic spread of tumor. Moderate right pleural effusion. 2. No significant vascular finding. Electronically Signed   By: JMonte FantasiaM.D.   On: 01/27/2018 09:59   Ct Chest W Contrast  Result Date: 01/28/2018 CLINICAL DATA:  History of breast cancer. Evaluate for metastatic disease. EXAM: CT CHEST, ABDOMEN, AND PELVIS WITH CONTRAST TECHNIQUE: Multidetector CT imaging of the  chest, abdomen and pelvis was performed following the standard protocol during bolus administration of intravenous contrast.  CONTRAST:  133m OMNIPAQUE IOHEXOL 300 MG/ML  SOLN COMPARISON:  Chest CT 05/13/2016; abdomen pelvic CT 05/31/2017 FINDINGS: CT CHEST FINDINGS Cardiovascular: Normal heart size.  Trace pericardial effusion. Mediastinum/Nodes: Interval development of mediastinal and right hilar adenopathy. Reference 2.1 cm right paratracheal lymph node (image 17; series 3). Reference 2.5 cm subcarinal node (image 25; series 3). Reference 2.6 x 4.2 cm right pericardiophrenic mass (image 31; series 3). There is a 1.5 cm left supraclavicular lymph node (image 7; series 3). Lungs/Pleura: Irregular right hilar soft tissue measures 3.4 x 3.2 cm compatible with right hilar adenopathy. This markedly narrows the right middle and lower lobe bronchi. New moderate right pleural effusion. Possible enhancing pleural nodularity within the inferior right pleural space (image 41; series 3). Interval development of multiple bilateral pulmonary nodules. Reference 1.4 cm nodule in the lingula (image 91; series 5). Reference 0.4 cm nodule within the left lower lobe (image 104; series 5). Multiple right upper lobe subpleural nodules measuring up to 5 mm (image 46; series 5). No pneumothorax. Musculoskeletal: Thoracic spine degenerative changes. No aggressive or acute appearing osseous lesions. Postsurgical changes left breast. CT ABDOMEN PELVIS FINDINGS Hepatobiliary: Interval development of a 5.2 x 4.2 cm low-attenuation mass within the right hepatic lobe (image 41; series 3). There is an adjacent 1.5 cm low-attenuation nodule (image 39; series 3). New 2.5 x 1.6 cm low-attenuation mass right hepatic lobe (image 51; series 3). Additional small low-attenuation lesions are demonstrated within the liver. Gallbladder is unremarkable. No intrahepatic or extrahepatic biliary ductal dilatation. Pancreas: Unremarkable Spleen: Interval  development of a 2.2 x 1.8 cm low-attenuation mass within the spleen (image 52; series 3). Adrenals/Urinary Tract: Stable small left adrenal nodule. Small right adrenal myelolipoma. Kidneys enhance symmetrically with contrast. No hydronephrosis. Streak artifact limits evaluation of the pelvis. Stomach/Bowel: Re demonstrated circumferential wall thickening of the sigmoid colon and rectum. Small pericolonic lymph nodes are demonstrated. Normal appendix. Vascular/Lymphatic: Normal caliber abdominal aorta. No retroperitoneal lymphadenopathy. Reproductive: Streak artifact limits evaluation of the pelvis. Other: None. Musculoskeletal: Bilateral hip arthroplasties. Stable patchy sclerosis left ilium lumbar spine degenerative changes. No aggressive or acute appearing osseous lesions. IMPRESSION: 1. Interval development of metastatic disease including mediastinal and right hilar adenopathy, bilateral pulmonary nodules, right pleural effusion with suggestion of peritoneal nodularity, hepatic metastatic disease and splenic metastatic disease. 2. Re demonstrated wall thickening of the sigmoid colon and rectum which may be secondary to an inflammatory process. Electronically Signed   By: DLovey NewcomerM.D.   On: 01/28/2018 16:53   Mr Brain Wo Contrast  Result Date: 01/26/2018 CLINICAL DATA:  Blurred vision and difficulty walking EXAM: MRI HEAD WITHOUT CONTRAST TECHNIQUE: Multiplanar, multiecho pulse sequences of the brain and surrounding structures were obtained without intravenous contrast. COMPARISON:  Head CT from earlier today FINDINGS: Brain: Subcentimeter foci of restricted diffusion along the left para median floor of the fourth ventricle (near the facial colliculus), right cerebellum, bilateral occipital cortex, and bilateral posterior frontal cortex, and right centrum semiovale. No prior ischemic injury is noted. No hemorrhage, hydrocephalus, or masslike finding Vascular: Major flow voids are preserved Skull and  upper cervical spine: Negative for marrow lesion Sinuses/Orbits: Negative IMPRESSION: Small scattered acute infarcts in the bilateral cerebrum, right cerebellum, and left posterior pons. The pontine infarct is near the facial colliculus and correlates with the visual complaints. The pattern suggests central embolic disease. Electronically Signed   By: JMonte FantasiaM.D.   On: 01/26/2018 12:20   Mr Brain W Contrast  Result Date: 01/28/2018  CLINICAL DATA:  Invasive breast cancer, stage IV, recurrence. Abnormal MRI the brain. Blurred vision and difficulty walking. EXAM: MRI HEAD WITH CONTRAST TECHNIQUE: Multiplanar, multiecho pulse sequences of the brain and surrounding structures were obtained with intravenous contrast. CONTRAST:  8 mL Gadavist COMPARISON:  MRI brain 01/26/2018. CTA of the head and neck 01/27/2018 FINDINGS: Brain: A 4 mm enhancing lesion present posteriorly in the right cerebellum on image 17 of series 3. Two punctate foci of enhancement are present in the anterior inferior left cerebellum on image 14 of series 3. Two additional punctate foci are present at the inferior aspect of the cerebellum bilaterally on image 11 of series 3. A punctate enhancing lesion is present at the inferior left temporal lobe on image 22 of series 3. A lesion in the left parietal white matter is best seen on images 36 and 37 of series 3. Cortical and subcortical punctate a lesions are present in the left parietal lobe images 42 and 41. An anterior right frontal lobe 3 mm enhancing nodule is present on image 43 of series 3. A linear cortical lesion measures 5.5 mm in the anterior right frontal lobe on image 41 of series 3. 3 mm lesion is present the anterior right frontal lobe white matter on image 38. A subcortical white matter lesion in the right occipital lobe measures 3 mm on image 32. The foci of enhancement are separate from the areas of restricted diffusion. The ventricles are of normal size. No significant  extra-axial fluid collection is present. Vascular: Normal vascular enhancement is present Skull and upper cervical spine: Craniocervical junction is normal. Discrete osseous lesions are present. The upper cervical spine is within normal limits. Sinuses/Orbits: The paranasal sinuses and mastoid air cells are clear. IMPRESSION: 1. At least 14 focal enhancing lesions are present in the cerebrum and cerebellum. The largest lesion is in the anterior right frontal lobe measuring 5.5 mm. These are separate from the previously seen foci of restricted diffusion and likely represent diffuse metastases. 2. No enhancement associated with the areas of restricted diffusion previously seen. These are likely separate areas of embolic infarcts. Electronically Signed   By: San Morelle M.D.   On: 01/28/2018 11:07   Ct Abdomen Pelvis W Contrast  Result Date: 01/28/2018 CLINICAL DATA:  History of breast cancer. Evaluate for metastatic disease. EXAM: CT CHEST, ABDOMEN, AND PELVIS WITH CONTRAST TECHNIQUE: Multidetector CT imaging of the chest, abdomen and pelvis was performed following the standard protocol during bolus administration of intravenous contrast. CONTRAST:  161m OMNIPAQUE IOHEXOL 300 MG/ML  SOLN COMPARISON:  Chest CT 05/13/2016; abdomen pelvic CT 05/31/2017 FINDINGS: CT CHEST FINDINGS Cardiovascular: Normal heart size.  Trace pericardial effusion. Mediastinum/Nodes: Interval development of mediastinal and right hilar adenopathy. Reference 2.1 cm right paratracheal lymph node (image 17; series 3). Reference 2.5 cm subcarinal node (image 25; series 3). Reference 2.6 x 4.2 cm right pericardiophrenic mass (image 31; series 3). There is a 1.5 cm left supraclavicular lymph node (image 7; series 3). Lungs/Pleura: Irregular right hilar soft tissue measures 3.4 x 3.2 cm compatible with right hilar adenopathy. This markedly narrows the right middle and lower lobe bronchi. New moderate right pleural effusion. Possible  enhancing pleural nodularity within the inferior right pleural space (image 41; series 3). Interval development of multiple bilateral pulmonary nodules. Reference 1.4 cm nodule in the lingula (image 91; series 5). Reference 0.4 cm nodule within the left lower lobe (image 104; series 5). Multiple right upper lobe subpleural nodules measuring up to 5  mm (image 46; series 5). No pneumothorax. Musculoskeletal: Thoracic spine degenerative changes. No aggressive or acute appearing osseous lesions. Postsurgical changes left breast. CT ABDOMEN PELVIS FINDINGS Hepatobiliary: Interval development of a 5.2 x 4.2 cm low-attenuation mass within the right hepatic lobe (image 41; series 3). There is an adjacent 1.5 cm low-attenuation nodule (image 39; series 3). New 2.5 x 1.6 cm low-attenuation mass right hepatic lobe (image 51; series 3). Additional small low-attenuation lesions are demonstrated within the liver. Gallbladder is unremarkable. No intrahepatic or extrahepatic biliary ductal dilatation. Pancreas: Unremarkable Spleen: Interval development of a 2.2 x 1.8 cm low-attenuation mass within the spleen (image 52; series 3). Adrenals/Urinary Tract: Stable small left adrenal nodule. Small right adrenal myelolipoma. Kidneys enhance symmetrically with contrast. No hydronephrosis. Streak artifact limits evaluation of the pelvis. Stomach/Bowel: Re demonstrated circumferential wall thickening of the sigmoid colon and rectum. Small pericolonic lymph nodes are demonstrated. Normal appendix. Vascular/Lymphatic: Normal caliber abdominal aorta. No retroperitoneal lymphadenopathy. Reproductive: Streak artifact limits evaluation of the pelvis. Other: None. Musculoskeletal: Bilateral hip arthroplasties. Stable patchy sclerosis left ilium lumbar spine degenerative changes. No aggressive or acute appearing osseous lesions. IMPRESSION: 1. Interval development of metastatic disease including mediastinal and right hilar adenopathy, bilateral  pulmonary nodules, right pleural effusion with suggestion of peritoneal nodularity, hepatic metastatic disease and splenic metastatic disease. 2. Re demonstrated wall thickening of the sigmoid colon and rectum which may be secondary to an inflammatory process. Electronically Signed   By: Lovey Newcomer M.D.   On: 01/28/2018 16:53   Vas [place] Lower Extremity Venous (dvt)  Result Date: 01/27/2018  Lower Venous Study Indications: Stroke.  Performing Technologist: Abram Sander RVS  Examination Guidelines: A complete evaluation includes B-mode imaging, spectral Doppler, color Doppler, and power Doppler as needed of all accessible portions of each vessel. Bilateral testing is considered an integral part of a complete examination. Limited examinations for reoccurring indications may be performed as noted.  Right Venous Findings: +---------+---------------+---------+-----------+----------+-------+          CompressibilityPhasicitySpontaneityPropertiesSummary +---------+---------------+---------+-----------+----------+-------+ CFV      Full           Yes      Yes                          +---------+---------------+---------+-----------+----------+-------+ SFJ      Full                                                 +---------+---------------+---------+-----------+----------+-------+ FV Prox  Full                                                 +---------+---------------+---------+-----------+----------+-------+ FV Mid   Full                                                 +---------+---------------+---------+-----------+----------+-------+ FV DistalFull                                                 +---------+---------------+---------+-----------+----------+-------+  PFV      Full                                                 +---------+---------------+---------+-----------+----------+-------+ POP      Full           Yes      Yes                           +---------+---------------+---------+-----------+----------+-------+ PTV      Full                                                 +---------+---------------+---------+-----------+----------+-------+ PERO     Full                                                 +---------+---------------+---------+-----------+----------+-------+  Left Venous Findings: +---------+---------------+---------+-----------+----------+-------+          CompressibilityPhasicitySpontaneityPropertiesSummary +---------+---------------+---------+-----------+----------+-------+ CFV      Full           Yes      Yes                          +---------+---------------+---------+-----------+----------+-------+ SFJ      Full                                                 +---------+---------------+---------+-----------+----------+-------+ FV Prox  Full                                                 +---------+---------------+---------+-----------+----------+-------+ FV Mid   Full                                                 +---------+---------------+---------+-----------+----------+-------+ FV DistalFull                                                 +---------+---------------+---------+-----------+----------+-------+ PFV      Full                                                 +---------+---------------+---------+-----------+----------+-------+ POP      Full           Yes      Yes                          +---------+---------------+---------+-----------+----------+-------+  PTV      Full                                                 +---------+---------------+---------+-----------+----------+-------+ PERO     Full                                                 +---------+---------------+---------+-----------+----------+-------+    Summary: Right: There is no evidence of deep vein thrombosis in the lower extremity. No cystic structure found in the popliteal  fossa. Left: There is no evidence of deep vein thrombosis in the lower extremity. No cystic structure found in the popliteal fossa.  *See table(s) above for measurements and observations. Electronically signed by Deitra Mayo MD on 01/27/2018 at 5:13:11 PM.    Final     Time Spent in minutes  25   Taura Lamarre M.D on 01/30/2018 at 12:44 PM  Between 7am to 7pm - Pager - [phone]  After 7pm go to [email or web address] - password [email or web address]  [facility] -  Office  [phone]

## 2018-01-30 NOTE — Procedures (Signed)
Ultrasound-guided diagnostic and therapeutic right thoracentesis performed yielding 500 cc of hazy, amber  fluid. No immediate complications. Follow-up chest x-ray pending. The fluid was sent to the lab for cytology. EBL none.

## 2018-01-30 NOTE — Progress Notes (Signed)
Referring Physician(s): Dhungel,N  Supervising Physician: Owens Shark  Patient Status:  [facility] - In-pt  Chief Complaint: Breast cancer with liver and brain lesions   Subjective: Patient familiar to IR service from right thoracentesis earlier today.  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  Request now received from primary care team and oncology for liver lesion biopsy for further evaluation.  She currently denies fever, headache, chest pain, nausea, vomiting or bleeding.  She does have some dyspnea with exertion/ occasional cough and persistent visual disturbances.  Past Medical History:  Diagnosis Date  . Arthritis   . Asthma    triggered with [name] perfumes and cigarette smoke  . Cancer ([place])   . Colitis   . Diabetes mellitus without complication ([place])   . History of radiation therapy 11/22/16-01/10/17   left breast 50.4 Gy in 28 fractions, axillary region 45 Gy in 25 fractions, lumpectomy cavity boost 10 Gy tin 5 fractions  . Hypertension   . Neuropathy    Past Surgical History:  Procedure Laterality Date  . ABDOMINAL HYSTERECTOMY    . BREAST LUMPECTOMY WITH RADIOACTIVE SEED AND SENTINEL LYMPH NODE BIOPSY Left 10/09/2016   Procedure: LEFT BREAST LUMPECTOMY WITH RADIOACTIVE SEED AND L4EFT AXILLARY SENTINEL LYMPH NODE BIOPSY;  Surgeon: [name], MD;  Location: [place];  Service: General;  Laterality: Left;  . DILATION AND CURETTAGE OF UTERUS    . KNEE ARTHROSCOPY Left   . PORTACATH PLACEMENT Right 05/08/2016   Procedure: INSERTION PORT-A-CATH WITH [place];  Surgeon: [name], MD;  Location: [place];  Service: General;  Laterality: Right;  . TONSILLECTOMY    . TOTAL HIP ARTHROPLASTY Right   . TOTAL HIP ARTHROPLASTY Left  03/06/2016   Procedure: LEFT TOTAL HIP ARTHROPLASTY ANTERIOR APPROACH;  Surgeon: [name], MD;  Location: [facility];  Service: Orthopedics;  Laterality: Left;      Allergies: Gadavist [gadobutrol]; Lisinopril; Augmentin [amoxicillin-pot clavulanate]; Benadryl [diphenhydramine]; and Losartan potassium  Medications: Prior to Admission medications   Medication Sig Start Date End Date Taking? Authorizing Provider  albuterol (PROVENTIL HFA;VENTOLIN HFA) 108 (90 Base) MCG/ACT inhaler Inhale 1-2 puffs into the lungs every 6 (six) hours as needed for wheezing or shortness of breath.   Yes [provider]  anastrozole (ARIMIDEX) 1 MG tablet Take 1 tablet (1 mg total) by mouth daily. 10/08/17  Yes Truitt Merle, MD  aspirin EC 81 MG tablet Take 81 mg by mouth 2 (two) times daily.   Yes [provider]  dicyclomine (BENTYL) 20 MG tablet Take 1 tablet (20 mg total) by mouth 4 (four) times daily -  before meals and at bedtime. Patient taking differently: Take 20 mg by mouth 3 (three) times daily as needed for spasms.  06/05/17  Yes Truitt Merle, MD  gabapentin (NEURONTIN) 100 MG capsule Take 200 mg by mouth at bedtime.    Yes [provider]  Garlic (GARLIQUE) 903 MG TBEC Take 400 mg by mouth daily.   Yes [provider]  Ginkgo Biloba Extract 120 MG CAPS Take 120 mg by mouth daily with breakfast.   Yes [provider]  loperamide (IMODIUM) 2 MG capsule Take 4 mg by mouth as needed for diarrhea or loose stools.   Yes [provider]  Mesalamine 800 MG TBEC 2 TABLETS BY MOUTH THREE TIMES  A DAY Patient taking differently: Take 2 tablets by mouth 2 (two) times daily.  07/26/17  Yes Ladene Artist, MD  metFORMIN (GLUCOPHAGE-XR) 500 MG 24 hr tablet Take 1,000 mg by mouth at bedtime.   Yes [provider]  metoprolol (LOPRESSOR) 50 MG tablet Take 50 mg by mouth 2 (two) times daily.    Yes [provider]  potassium chloride SA (KLOR-CON M20) 20 MEQ  tablet Take 1 tablet (20 mEq total) by mouth 2 (two) times daily. Patient taking differently: Take 20 mEq by mouth daily with breakfast.  05/10/17  Yes Truitt Merle, MD  Probiotic Product (PROBIOTIC-10) CAPS Take 2 capsules by mouth daily with breakfast.   Yes [provider]  triamterene-hydrochlorothiazide (MAXZIDE) 75-50 MG per tablet Take 0.5 tablets by mouth daily with breakfast.    Yes [provider]  meclizine (ANTIVERT) 25 MG tablet Take 1 tablet (25 mg total) by mouth 3 (three) times daily as needed for dizziness. Patient not taking: Reported on 01/26/2018 11/28/17   Isla Pence, MD     Vital Signs: BP 132/89 (BP Location: Right Arm)   Pulse 100   Temp 98.6 F (37 C) (Oral)   Resp 20   Ht 5\' 4"  (1.626 m)   Wt 175 lb 3.2 oz (79.5 kg)   SpO2 100%   BMI 30.07 kg/m   Physical Exam awake, alert and oriented;  Chest with slightly diminished breath sounds right base, left clear.  Heart with regular rate and rhythm.  Abdomen soft, positive bowel sounds, nontender; ext with FROM.  Imaging: Ct Angio Head W Or Wo Contrast  Result Date: 01/27/2018 CLINICAL DATA:  Stroke follow-up EXAM: CT ANGIOGRAPHY HEAD AND NECK TECHNIQUE: Multidetector CT imaging of the head and neck was performed using the standard protocol during bolus administration of intravenous contrast. Multiplanar CT image reconstructions and MIPs were obtained to evaluate the vascular anatomy. Carotid stenosis measurements (when applicable) are obtained utilizing NASCET criteria, using the distal internal carotid diameter as the denominator. CONTRAST:  143mL ISOVUE-370 IOPAMIDOL (ISOVUE-370) INJECTION 76% COMPARISON:  Head CT and brain MRI from yesterday FINDINGS: CTA NECK FINDINGS Aortic arch: Normal appearance Right carotid system: Vessels are smooth and widely patent Left carotid system: Vessels are smooth and widely patent. No noted atheromatous changes Vertebral arteries: Vessels are smooth and widely  patent. Early origin left PICA which branches from the V3 segment. Skeleton: Degenerative disease without acute finding Other neck: Enlarged lymph nodes in the low left posterior triangle and supraclavicular fossa. Incidental right thyroid nodule Upper chest: There is worrisome, centrally low-dense adenopathy in the bilateral mediastinum, thickest at the level of the subcarinal space where there is a 2 cm diameter. Layering right pleural effusion, at least moderate. There is subpleural nodularity and both upper lobes. Review of the MIP images confirms the above findings CTA HEAD FINDINGS Anterior circulation: Vessels are smooth and widely patent Posterior circulation: Vessels are smooth and widely patent. Hypoplastic left P1 segment Venous sinuses: Patent Anatomic variants: As above Delayed phase: No abnormal intracranial enhancement Review of the MIP images confirms the above findings IMPRESSION: 1. Malignant-appearing adenopathy in the chest and left lower neck presumably related patient's history of breast cancer. Bilateral upper lobe subpleural nodularity suggesting lymphatic spread of tumor. Moderate right pleural effusion. 2. No significant vascular finding. Electronically Signed   By: Monte Fantasia M.D.   On: 01/27/2018 09:59   Dg Chest 1 View  Result Date: 01/30/2018 CLINICAL DATA:  Post right thoracentesis EXAM:  CHEST  1 VIEW COMPARISON:  11/28/2017 FINDINGS: Small moderate right pleural effusion with right base atelectasis. Left lung clear. Heart is normal size. No pneumothorax following thoracentesis. IMPRESSION: Small to moderate right pleural effusion with right lower lobe atelectasis. No pneumothorax following thoracentesis. Electronically Signed   By: Rolm Baptise M.D.   On: 01/30/2018 11:19   Ct Angio Neck W Or Wo Contrast  Result Date: 01/27/2018 CLINICAL DATA:  Stroke follow-up EXAM: CT ANGIOGRAPHY HEAD AND NECK TECHNIQUE: Multidetector CT imaging of the head and neck was performed using  the standard protocol during bolus administration of intravenous contrast. Multiplanar CT image reconstructions and MIPs were obtained to evaluate the vascular anatomy. Carotid stenosis measurements (when applicable) are obtained utilizing NASCET criteria, using the distal internal carotid diameter as the denominator. CONTRAST:  147mL ISOVUE-370 IOPAMIDOL (ISOVUE-370) INJECTION 76% COMPARISON:  Head CT and brain MRI from yesterday FINDINGS: CTA NECK FINDINGS Aortic arch: Normal appearance Right carotid system: Vessels are smooth and widely patent Left carotid system: Vessels are smooth and widely patent. No noted atheromatous changes Vertebral arteries: Vessels are smooth and widely patent. Early origin left PICA which branches from the V3 segment. Skeleton: Degenerative disease without acute finding Other neck: Enlarged lymph nodes in the low left posterior triangle and supraclavicular fossa. Incidental right thyroid nodule Upper chest: There is worrisome, centrally low-dense adenopathy in the bilateral mediastinum, thickest at the level of the subcarinal space where there is a 2 cm diameter. Layering right pleural effusion, at least moderate. There is subpleural nodularity and both upper lobes. Review of the MIP images confirms the above findings CTA HEAD FINDINGS Anterior circulation: Vessels are smooth and widely patent Posterior circulation: Vessels are smooth and widely patent. Hypoplastic left P1 segment Venous sinuses: Patent Anatomic variants: As above Delayed phase: No abnormal intracranial enhancement Review of the MIP images confirms the above findings IMPRESSION: 1. Malignant-appearing adenopathy in the chest and left lower neck presumably related patient's history of breast cancer. Bilateral upper lobe subpleural nodularity suggesting lymphatic spread of tumor. Moderate right pleural effusion. 2. No significant vascular finding. Electronically Signed   By: Monte Fantasia M.D.   On: 01/27/2018 09:59    Ct Chest W Contrast  Result Date: 01/28/2018 CLINICAL DATA:  History of breast cancer. Evaluate for metastatic disease. EXAM: CT CHEST, ABDOMEN, AND PELVIS WITH CONTRAST TECHNIQUE: Multidetector CT imaging of the chest, abdomen and pelvis was performed following the standard protocol during bolus administration of intravenous contrast. CONTRAST:  153mL OMNIPAQUE IOHEXOL 300 MG/ML  SOLN COMPARISON:  Chest CT 05/13/2016; abdomen pelvic CT 05/31/2017 FINDINGS: CT CHEST FINDINGS Cardiovascular: Normal heart size.  Trace pericardial effusion. Mediastinum/Nodes: Interval development of mediastinal and right hilar adenopathy. Reference 2.1 cm right paratracheal lymph node (image 17; series 3). Reference 2.5 cm subcarinal node (image 25; series 3). Reference 2.6 x 4.2 cm right pericardiophrenic mass (image 31; series 3). There is a 1.5 cm left supraclavicular lymph node (image 7; series 3). Lungs/Pleura: Irregular right hilar soft tissue measures 3.4 x 3.2 cm compatible with right hilar adenopathy. This markedly narrows the right middle and lower lobe bronchi. New moderate right pleural effusion. Possible enhancing pleural nodularity within the inferior right pleural space (image 41; series 3). Interval development of multiple bilateral pulmonary nodules. Reference 1.4 cm nodule in the lingula (image 91; series 5). Reference 0.4 cm nodule within the left lower lobe (image 104; series 5). Multiple right upper lobe subpleural nodules measuring up to 5 mm (image 46; series 5). No  pneumothorax. Musculoskeletal: Thoracic spine degenerative changes. No aggressive or acute appearing osseous lesions. Postsurgical changes left breast. CT ABDOMEN PELVIS FINDINGS Hepatobiliary: Interval development of a 5.2 x 4.2 cm low-attenuation mass within the right hepatic lobe (image 41; series 3). There is an adjacent 1.5 cm low-attenuation nodule (image 39; series 3). New 2.5 x 1.6 cm low-attenuation mass right hepatic lobe (image 51;  series 3). Additional small low-attenuation lesions are demonstrated within the liver. Gallbladder is unremarkable. No intrahepatic or extrahepatic biliary ductal dilatation. Pancreas: Unremarkable Spleen: Interval development of a 2.2 x 1.8 cm low-attenuation mass within the spleen (image 52; series 3). Adrenals/Urinary Tract: Stable small left adrenal nodule. Small right adrenal myelolipoma. Kidneys enhance symmetrically with contrast. No hydronephrosis. Streak artifact limits evaluation of the pelvis. Stomach/Bowel: Re demonstrated circumferential wall thickening of the sigmoid colon and rectum. Small pericolonic lymph nodes are demonstrated. Normal appendix. Vascular/Lymphatic: Normal caliber abdominal aorta. No retroperitoneal lymphadenopathy. Reproductive: Streak artifact limits evaluation of the pelvis. Other: None. Musculoskeletal: Bilateral hip arthroplasties. Stable patchy sclerosis left ilium lumbar spine degenerative changes. No aggressive or acute appearing osseous lesions. IMPRESSION: 1. Interval development of metastatic disease including mediastinal and right hilar adenopathy, bilateral pulmonary nodules, right pleural effusion with suggestion of peritoneal nodularity, hepatic metastatic disease and splenic metastatic disease. 2. Re demonstrated wall thickening of the sigmoid colon and rectum which may be secondary to an inflammatory process. Electronically Signed   By: Lovey Newcomer M.D.   On: 01/28/2018 16:53   Mr Brain W Contrast  Result Date: 01/28/2018 CLINICAL DATA:  Invasive breast cancer, stage IV, recurrence. Abnormal MRI the brain. Blurred vision and difficulty walking. EXAM: MRI HEAD WITH CONTRAST TECHNIQUE: Multiplanar, multiecho pulse sequences of the brain and surrounding structures were obtained with intravenous contrast. CONTRAST:  8 mL Gadavist COMPARISON:  MRI brain 01/26/2018. CTA of the head and neck 01/27/2018 FINDINGS: Brain: A 4 mm enhancing lesion present posteriorly in the  right cerebellum on image 17 of series 3. Two punctate foci of enhancement are present in the anterior inferior left cerebellum on image 14 of series 3. Two additional punctate foci are present at the inferior aspect of the cerebellum bilaterally on image 11 of series 3. A punctate enhancing lesion is present at the inferior left temporal lobe on image 22 of series 3. A lesion in the left parietal white matter is best seen on images 36 and 37 of series 3. Cortical and subcortical punctate a lesions are present in the left parietal lobe images 42 and 41. An anterior right frontal lobe 3 mm enhancing nodule is present on image 43 of series 3. A linear cortical lesion measures 5.5 mm in the anterior right frontal lobe on image 41 of series 3. 3 mm lesion is present the anterior right frontal lobe white matter on image 38. A subcortical white matter lesion in the right occipital lobe measures 3 mm on image 32. The foci of enhancement are separate from the areas of restricted diffusion. The ventricles are of normal size. No significant extra-axial fluid collection is present. Vascular: Normal vascular enhancement is present Skull and upper cervical spine: Craniocervical junction is normal. Discrete osseous lesions are present. The upper cervical spine is within normal limits. Sinuses/Orbits: The paranasal sinuses and mastoid air cells are clear. IMPRESSION: 1. At least 14 focal enhancing lesions are present in the cerebrum and cerebellum. The largest lesion is in the anterior right frontal lobe measuring 5.5 mm. These are separate from the previously seen foci of  restricted diffusion and likely represent diffuse metastases. 2. No enhancement associated with the areas of restricted diffusion previously seen. These are likely separate areas of embolic infarcts. Electronically Signed   By: San Morelle M.D.   On: 01/28/2018 11:07   Ct Abdomen Pelvis W Contrast  Result Date: 01/28/2018 CLINICAL DATA:  History of  breast cancer. Evaluate for metastatic disease. EXAM: CT CHEST, ABDOMEN, AND PELVIS WITH CONTRAST TECHNIQUE: Multidetector CT imaging of the chest, abdomen and pelvis was performed following the standard protocol during bolus administration of intravenous contrast. CONTRAST:  171mL OMNIPAQUE IOHEXOL 300 MG/ML  SOLN COMPARISON:  Chest CT 05/13/2016; abdomen pelvic CT 05/31/2017 FINDINGS: CT CHEST FINDINGS Cardiovascular: Normal heart size.  Trace pericardial effusion. Mediastinum/Nodes: Interval development of mediastinal and right hilar adenopathy. Reference 2.1 cm right paratracheal lymph node (image 17; series 3). Reference 2.5 cm subcarinal node (image 25; series 3). Reference 2.6 x 4.2 cm right pericardiophrenic mass (image 31; series 3). There is a 1.5 cm left supraclavicular lymph node (image 7; series 3). Lungs/Pleura: Irregular right hilar soft tissue measures 3.4 x 3.2 cm compatible with right hilar adenopathy. This markedly narrows the right middle and lower lobe bronchi. New moderate right pleural effusion. Possible enhancing pleural nodularity within the inferior right pleural space (image 41; series 3). Interval development of multiple bilateral pulmonary nodules. Reference 1.4 cm nodule in the lingula (image 91; series 5). Reference 0.4 cm nodule within the left lower lobe (image 104; series 5). Multiple right upper lobe subpleural nodules measuring up to 5 mm (image 46; series 5). No pneumothorax. Musculoskeletal: Thoracic spine degenerative changes. No aggressive or acute appearing osseous lesions. Postsurgical changes left breast. CT ABDOMEN PELVIS FINDINGS Hepatobiliary: Interval development of a 5.2 x 4.2 cm low-attenuation mass within the right hepatic lobe (image 41; series 3). There is an adjacent 1.5 cm low-attenuation nodule (image 39; series 3). New 2.5 x 1.6 cm low-attenuation mass right hepatic lobe (image 51; series 3). Additional small low-attenuation lesions are demonstrated within the  liver. Gallbladder is unremarkable. No intrahepatic or extrahepatic biliary ductal dilatation. Pancreas: Unremarkable Spleen: Interval development of a 2.2 x 1.8 cm low-attenuation mass within the spleen (image 52; series 3). Adrenals/Urinary Tract: Stable small left adrenal nodule. Small right adrenal myelolipoma. Kidneys enhance symmetrically with contrast. No hydronephrosis. Streak artifact limits evaluation of the pelvis. Stomach/Bowel: Re demonstrated circumferential wall thickening of the sigmoid colon and rectum. Small pericolonic lymph nodes are demonstrated. Normal appendix. Vascular/Lymphatic: Normal caliber abdominal aorta. No retroperitoneal lymphadenopathy. Reproductive: Streak artifact limits evaluation of the pelvis. Other: None. Musculoskeletal: Bilateral hip arthroplasties. Stable patchy sclerosis left ilium lumbar spine degenerative changes. No aggressive or acute appearing osseous lesions. IMPRESSION: 1. Interval development of metastatic disease including mediastinal and right hilar adenopathy, bilateral pulmonary nodules, right pleural effusion with suggestion of peritoneal nodularity, hepatic metastatic disease and splenic metastatic disease. 2. Re demonstrated wall thickening of the sigmoid colon and rectum which may be secondary to an inflammatory process. Electronically Signed   By: Lovey Newcomer M.D.   On: 01/28/2018 16:53   Vas [place] Lower Extremity Venous (dvt)  Result Date: 01/27/2018  Lower Venous Study Indications: Stroke.  Performing Technologist: Abram Sander RVS  Examination Guidelines: A complete evaluation includes B-mode imaging, spectral Doppler, color Doppler, and power Doppler as needed of all accessible portions of each vessel. Bilateral testing is considered an integral part of a complete examination. Limited examinations for reoccurring indications may be performed as noted.  Right Venous Findings: +---------+---------------+---------+-----------+----------+-------+  CompressibilityPhasicitySpontaneityPropertiesSummary +---------+---------------+---------+-----------+----------+-------+ CFV      Full           Yes      Yes                          +---------+---------------+---------+-----------+----------+-------+ SFJ      Full                                                 +---------+---------------+---------+-----------+----------+-------+ FV Prox  Full                                                 +---------+---------------+---------+-----------+----------+-------+ FV Mid   Full                                                 +---------+---------------+---------+-----------+----------+-------+ FV DistalFull                                                 +---------+---------------+---------+-----------+----------+-------+ PFV      Full                                                 +---------+---------------+---------+-----------+----------+-------+ POP      Full           Yes      Yes                          +---------+---------------+---------+-----------+----------+-------+ PTV      Full                                                 +---------+---------------+---------+-----------+----------+-------+ PERO     Full                                                 +---------+---------------+---------+-----------+----------+-------+  Left Venous Findings: +---------+---------------+---------+-----------+----------+-------+          CompressibilityPhasicitySpontaneityPropertiesSummary +---------+---------------+---------+-----------+----------+-------+ CFV      Full           Yes      Yes                          +---------+---------------+---------+-----------+----------+-------+ SFJ      Full                                                 +---------+---------------+---------+-----------+----------+-------+  FV Prox  Full                                                  +---------+---------------+---------+-----------+----------+-------+ FV Mid   Full                                                 +---------+---------------+---------+-----------+----------+-------+ FV DistalFull                                                 +---------+---------------+---------+-----------+----------+-------+ PFV      Full                                                 +---------+---------------+---------+-----------+----------+-------+ POP      Full           Yes      Yes                          +---------+---------------+---------+-----------+----------+-------+ PTV      Full                                                 +---------+---------------+---------+-----------+----------+-------+ PERO     Full                                                 +---------+---------------+---------+-----------+----------+-------+    Summary: Right: There is no evidence of deep vein thrombosis in the lower extremity. No cystic structure found in the popliteal fossa. Left: There is no evidence of deep vein thrombosis in the lower extremity. No cystic structure found in the popliteal fossa.  *See table(s) above for measurements and observations. Electronically signed by Deitra Mayo MD on 01/27/2018 at 5:13:11 PM.    Final    US Thoracentesis Asp Pleural Space W/img Guide  Result Date: 01/30/2018 INDICATION: Patient with history of breast cancer, dyspnea, right pleural effusion. Request made for diagnostic and therapeutic right thoracentesis. EXAM: ULTRASOUND GUIDED DIAGNOSTIC AND THERAPEUTIC RIGHT THORACENTESIS MEDICATIONS: None COMPLICATIONS: None immediate. PROCEDURE: An ultrasound guided thoracentesis was thoroughly discussed with the patient and questions answered. The benefits, risks, alternatives and complications were also discussed. The patient understands and wishes to proceed with the procedure. Written consent was obtained. Ultrasound was  performed to localize and mark an adequate pocket of fluid in the right chest. The area was then prepped and draped in the normal sterile fashion. 1% Lidocaine was used for local anesthesia. Under ultrasound guidance a 6 Fr Safe-T-Centesis catheter was introduced. Thoracentesis was performed. The catheter was removed and a dressing applied. FINDINGS: A total of approximately 500 cc of hazy, amber fluid was removed. Samples were  sent to the laboratory as requested by the clinical team. IMPRESSION: Successful ultrasound guided diagnostic and therapeutic right thoracentesis yielding 500 cc of pleural fluid. Read by: Rowe Robert, PA-C Electronically Signed   By: Markus Daft M.D.   On: 01/30/2018 11:44    Labs:  CBC: Recent Labs    11/28/17 2101 01/24/18 0753 01/26/18 0051 01/28/18 0431  WBC 6.2 7.4 7.8 4.7  HGB 12.5 12.9 12.1 11.5*  HCT 40.7 42.0 39.7 37.5  PLT 244 305 296 229    COAGS: Recent Labs    11/28/17 2101 01/26/18 0051 01/30/18 1456  INR 0.92 0.98 0.99  APTT 31 32  --     BMP: Recent Labs    11/28/17 2101 01/24/18 0753 01/26/18 0051 01/28/18 0431  NA 140 140 138 140  K 3.5 4.0 3.7 4.0  CL 105 103 103 105  CO2 27 28 27 25   GLUCOSE 92 103* 120* 95  BUN 12 8 17 8   CALCIUM 8.9 9.6 9.0 8.9  CREATININE 0.85 0.98 1.02* 0.84  GFRNONAA >60 >60 >60 >60  GFRAA >60 >60 >60 >60    LIVER FUNCTION TESTS: Recent Labs    10/08/17 0900 11/28/17 2101 01/24/18 0753 01/26/18 0051  BILITOT 0.6 0.6 0.3 0.4  AST 12* 16 20 20   ALT 9 11 15 15   ALKPHOS 77 71 106 85  PROT 7.3 7.5 7.6 7.2  ALBUMIN 3.2* 3.7 3.2* 3.3*    Assessment and Plan: Pt with history of breast cancer with associated peripheral neuropathy, diabetes, 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  Request now received from primary care team and oncology for liver lesion biopsy for  further evaluation.  Imaging studies were reviewed by Dr. Anselm Pancoast and either liver lesion versus left neck lymph node appear to be amenable to biopsy.Risks and benefits discussed with the patient/spouse including, but not limited to bleeding, infection, damage to adjacent structures or low yield requiring additional tests.  All of the patient's questions were answered, patient is agreeable to proceed. Consent signed and in chart.  Procedure scheduled for 1/3   Electronically Signed: D. Rowe Robert, PA-C 01/30/2018, 5:07 PM   I spent a total of 25 minutes at the the patient's bedside AND on the patient's hospital floor or unit, greater than 50% of which was counseling/coordinating care for image guided liver lesion versus neck lymph node biopsy    Patient ID: Brandi Dickson, female   DOB: Jan 21, 1961, 59 y.o.   MRN: [id number]

## 2018-01-30 NOTE — Progress Notes (Addendum)
  Radiation Oncology         [phone] ________________________________  Name: [name] MRN: [id number]  Date: 01/30/2018  DOB: 08-31-1960  Inpatient  SIMULATION AND TREATMENT PLANNING NOTE    ICD-10-CM   1. Brain metastases ([place]) C79.31     DIAGNOSIS:  58 y.o.woman with at least 14 brain metastases from Invasive Ductal Carcinoma of the Upper Outer Quadrant of the left breast  NARRATIVE:  The patient was brought to the [place].  Identity was confirmed.  All relevant records and images related to the planned course of therapy were reviewed.  The patient freely provided informed written consent to proceed with treatment after reviewing the details related to the planned course of therapy. The consent form was witnessed and verified by the simulation staff.  Then, the patient was set-up in a stable reproducible  supine position for radiation therapy.  CT images were obtained.  Surface markings were placed.  The CT images were loaded into the planning software.  Then the target and avoidance structures were contoured.  Treatment planning then occurred.  The radiation prescription was entered and confirmed.  Then, I designed and supervised the construction of a total of 3 medically necessary complex treatment devices, including a custom made thermoplastic mask used for immobilization and two complex multileaf collimators to cover the entire intracranial contents, while shielding the eyes and face.  Each [facility] is independently created to account for beam divergence.  The right and left lateral fields will be treated with 6 MV X-rays.  I have requested : Isodose Plan.    PLAN:  The whole brain will be treated to 35 Gy in 14 fractions.  ________________________________  Sheral Apley Tammi Klippel, M.D.

## 2018-01-30 NOTE — Progress Notes (Signed)
Hypoglycemic Event  CBG: 53  Treatment: 8 oz juice/soda  Symptoms: None  Follow-up CBG: Time:1726  CBG Result:117  Possible Reasons for Event: Unknown  Comments/MD notified:Will continue to monitor    Brandi Dickson

## 2018-01-30 NOTE — Progress Notes (Signed)
Brandi Dickson   DOB:1960/03/10   OF#:[id number]    Assessment & Plan:  In summary, Brandi Dickson is a 58 y.o. female, with PMH of stage IIA left breast cancer, s/p neoadjuvant chemotherapy, maintenance Herceptin, surgery, adjuvant radiation and Xeloda, currently on anastrozole, who presented with double vision and difficulty walking and was found with extensive metastatic disease, likely breast primary.  Suspected metastatic breast cancer -I independently reviewed the radiologic images of MRI brain and CT CAP, and agree with findings as documented -In summary, MRI brain showed extensive brain metastases, and CT CAP showed metastatic disease in in the lungs, mediastinal lymph nodes, liver, and spleen -Patient has been evaluated by Brandi Dickson of radiation oncology, and has received first treatment of whole brain RT on 01/30/2018 -She underwent diagnostic and therapeutic thoracentesis of the right pleural effusion today, cytology pending -I discussed the case with the patient's primary oncologist, Brandi Dickson, who recommended liver biopsy for tissue confirmation and molecular testing, especially given the low diagnostic yield of pleural effusion and insufficient tissue for molecular studies -I spoke with the patient's internal medicine physician, who will try to schedule IR liver biopsy prior to discharge; however, if it is unable to be done prior to the weekend, patient would like to be discharged home with plan for outpatient liver biopsy  Embolic strokes -MRI on admission showed small scattered acute infarcts in the bilateral cerebrum, R cerebellum and left posterior pons, suggestive of embolic disease -Per Brandi Dickson of neurology, patient can continue Lovenox ppx, given the anticipated procedures, and once she completes work-up, then she can transitioned to DOAC's -I will defer the management to neurology  Goals of care discussion -The patient had many questions regarding the diagnosis, potential  treatment options, and prognosis; she stated that she has been married to her husband for 15 years, and would like to have "another 32 years with this wonderful man" -I reviewed with the patient in detail the imaging results so far; given the metastatic disease in the brain, in the lungs and mediastinal lymph nodes, and the liver, this is stage IV disease, which is not curable, but treatable -Furthermore, I explained to the patient that before being able to discuss the specific treatment regimen, side effects, and prognosis, we would need to establish the diagnosis first -In addition, the treatment regimen can also vary depending on molecular testing -The patient expressed her strong belief in God, and that she felt that her heart that "God is going to get me through this" -I spent some time offering emotional support to the patient, and answered her questions to the extent based on the known information; the patient understand that some of the information may change based on further work-up  Thank you for the opportunity to participate in Brandi Dickson's care. Please do not hesitate to contact me if there are any questions.  A total of more than 60 minutes were spent face-to-face with the patient during this encounter and over half of that time was spent on counseling and coordination of care as outlined above.  Tish Men, MD 01/30/2018  1:47 PM   Subjective:  Brandi Dickson reports that her shortness of breath has improved significantly since her thoracentesis this morning.  She is not able to speak in complete sentences and has only occasional cough, which is much improved since her admission.  She still has disconjugate gaze, but it is mitigated somewhat by the glasses that she wears.  She denies any headache, chest pain, hemoptysis,  abdominal pain, nausea, vomiting, diarrhea, dysuria or hematuria.  ROS: Constitutional: ( - ) fevers, ( - )  chills , ( - ) night sweats Ears, nose, mouth, throat, and  face: ( - ) mucositis, ( - ) sore throat Respiratory: ( - ) cough, ( + ) improving dyspnea, ( - ) wheezes Cardiovascular: ( - ) palpitation, ( - ) chest discomfort, ( + ) lower extremity swelling Gastrointestinal:  ( - ) nausea, ( - ) heartburn, ( - ) change in bowel habits Skin: ( - ) abnormal skin rashes Behavioral/Psych: ( - ) mood change, ( - ) new changes  All other systems were reviewed with the patient and are negative.  Objective:  Vitals:   01/30/18 1044 01/30/18 1334  BP: 118/87 132/89  Pulse:  100  Resp:  20  Temp:  98.6 F (37 C)  SpO2:  100%     Intake/Output Summary (Last 24 hours) at 01/30/2018 1347 Last data filed at 01/30/2018 0757 Gross per 24 hour  Intake 1392.52 ml  Output -  Net 1392.52 ml    GENERAL: alert, no distress and comfortable, pleasant SKIN: skin color, texture, turgor are normal, no rashes or significant lesions EYES: conjunctiva are pink and non-injected, sclera clear OROPHARYNX: no exudate, no erythema; lips, buccal mucosa, and tongue normal  NECK: supple, non-tender LUNGS: Slight crackles in the right lung base, lungs otherwise clear to auscultation bilaterally HEART: regular rate & rhythm and no murmurs and 1+ bilateral lower extremity edema ABDOMEN: soft, non-tender, non-distended, normal bowel sounds PSYCH: alert & oriented x 3, fluent speech   Labs:  Lab Results  Component Value Date   WBC 4.7 01/28/2018   HGB 11.5 (L) 01/28/2018   HCT 37.5 01/28/2018   MCV 83.5 01/28/2018   PLT 229 01/28/2018   NEUTROABS 5.2 01/26/2018    Lab Results  Component Value Date   NA 140 01/28/2018   K 4.0 01/28/2018   CL 105 01/28/2018   CO2 25 01/28/2018    Studies:  Dg Chest 1 View  Result Date: 01/30/2018 CLINICAL DATA:  Post right thoracentesis EXAM: CHEST  1 VIEW COMPARISON:  11/28/2017 FINDINGS: Small moderate right pleural effusion with right base atelectasis. Left lung clear. Heart is normal size. No pneumothorax following thoracentesis.  IMPRESSION: Small to moderate right pleural effusion with right lower lobe atelectasis. No pneumothorax following thoracentesis. Electronically Signed   By: Rolm Baptise M.D.   On: 01/30/2018 11:19   Ct Chest W Contrast  Result Date: 01/28/2018 CLINICAL DATA:  History of breast cancer. Evaluate for metastatic disease. EXAM: CT CHEST, ABDOMEN, AND PELVIS WITH CONTRAST TECHNIQUE: Multidetector CT imaging of the chest, abdomen and pelvis was performed following the standard protocol during bolus administration of intravenous contrast. CONTRAST:  150mL OMNIPAQUE IOHEXOL 300 MG/ML  SOLN COMPARISON:  Chest CT 05/13/2016; abdomen pelvic CT 05/31/2017 FINDINGS: CT CHEST FINDINGS Cardiovascular: Normal heart size.  Trace pericardial effusion. Mediastinum/Nodes: Interval development of mediastinal and right hilar adenopathy. Reference 2.1 cm right paratracheal lymph node (image 17; series 3). Reference 2.5 cm subcarinal node (image 25; series 3). Reference 2.6 x 4.2 cm right pericardiophrenic mass (image 31; series 3). There is a 1.5 cm left supraclavicular lymph node (image 7; series 3). Lungs/Pleura: Irregular right hilar soft tissue measures 3.4 x 3.2 cm compatible with right hilar adenopathy. This markedly narrows the right middle and lower lobe bronchi. New moderate right pleural effusion. Possible enhancing pleural nodularity within the inferior right pleural space (image 41;  series 3). Interval development of multiple bilateral pulmonary nodules. Reference 1.4 cm nodule in the lingula (image 91; series 5). Reference 0.4 cm nodule within the left lower lobe (image 104; series 5). Multiple right upper lobe subpleural nodules measuring up to 5 mm (image 46; series 5). No pneumothorax. Musculoskeletal: Thoracic spine degenerative changes. No aggressive or acute appearing osseous lesions. Postsurgical changes left breast. CT ABDOMEN PELVIS FINDINGS Hepatobiliary: Interval development of a 5.2 x 4.2 cm low-attenuation  mass within the right hepatic lobe (image 41; series 3). There is an adjacent 1.5 cm low-attenuation nodule (image 39; series 3). New 2.5 x 1.6 cm low-attenuation mass right hepatic lobe (image 51; series 3). Additional small low-attenuation lesions are demonstrated within the liver. Gallbladder is unremarkable. No intrahepatic or extrahepatic biliary ductal dilatation. Pancreas: Unremarkable Spleen: Interval development of a 2.2 x 1.8 cm low-attenuation mass within the spleen (image 52; series 3). Adrenals/Urinary Tract: Stable small left adrenal nodule. Small right adrenal myelolipoma. Kidneys enhance symmetrically with contrast. No hydronephrosis. Streak artifact limits evaluation of the pelvis. Stomach/Bowel: Re demonstrated circumferential wall thickening of the sigmoid colon and rectum. Small pericolonic lymph nodes are demonstrated. Normal appendix. Vascular/Lymphatic: Normal caliber abdominal aorta. No retroperitoneal lymphadenopathy. Reproductive: Streak artifact limits evaluation of the pelvis. Other: None. Musculoskeletal: Bilateral hip arthroplasties. Stable patchy sclerosis left ilium lumbar spine degenerative changes. No aggressive or acute appearing osseous lesions. IMPRESSION: 1. Interval development of metastatic disease including mediastinal and right hilar adenopathy, bilateral pulmonary nodules, right pleural effusion with suggestion of peritoneal nodularity, hepatic metastatic disease and splenic metastatic disease. 2. Re demonstrated wall thickening of the sigmoid colon and rectum which may be secondary to an inflammatory process. Electronically Signed   By: Lovey Newcomer M.D.   On: 01/28/2018 16:53   Ct Abdomen Pelvis W Contrast  Result Date: 01/28/2018 CLINICAL DATA:  History of breast cancer. Evaluate for metastatic disease. EXAM: CT CHEST, ABDOMEN, AND PELVIS WITH CONTRAST TECHNIQUE: Multidetector CT imaging of the chest, abdomen and pelvis was performed following the standard protocol  during bolus administration of intravenous contrast. CONTRAST:  137mL OMNIPAQUE IOHEXOL 300 MG/ML  SOLN COMPARISON:  Chest CT 05/13/2016; abdomen pelvic CT 05/31/2017 FINDINGS: CT CHEST FINDINGS Cardiovascular: Normal heart size.  Trace pericardial effusion. Mediastinum/Nodes: Interval development of mediastinal and right hilar adenopathy. Reference 2.1 cm right paratracheal lymph node (image 17; series 3). Reference 2.5 cm subcarinal node (image 25; series 3). Reference 2.6 x 4.2 cm right pericardiophrenic mass (image 31; series 3). There is a 1.5 cm left supraclavicular lymph node (image 7; series 3). Lungs/Pleura: Irregular right hilar soft tissue measures 3.4 x 3.2 cm compatible with right hilar adenopathy. This markedly narrows the right middle and lower lobe bronchi. New moderate right pleural effusion. Possible enhancing pleural nodularity within the inferior right pleural space (image 41; series 3). Interval development of multiple bilateral pulmonary nodules. Reference 1.4 cm nodule in the lingula (image 91; series 5). Reference 0.4 cm nodule within the left lower lobe (image 104; series 5). Multiple right upper lobe subpleural nodules measuring up to 5 mm (image 46; series 5). No pneumothorax. Musculoskeletal: Thoracic spine degenerative changes. No aggressive or acute appearing osseous lesions. Postsurgical changes left breast. CT ABDOMEN PELVIS FINDINGS Hepatobiliary: Interval development of a 5.2 x 4.2 cm low-attenuation mass within the right hepatic lobe (image 41; series 3). There is an adjacent 1.5 cm low-attenuation nodule (image 39; series 3). New 2.5 x 1.6 cm low-attenuation mass right hepatic lobe (image 51; series 3). Additional  small low-attenuation lesions are demonstrated within the liver. Gallbladder is unremarkable. No intrahepatic or extrahepatic biliary ductal dilatation. Pancreas: Unremarkable Spleen: Interval development of a 2.2 x 1.8 cm low-attenuation mass within the spleen (image  52; series 3). Adrenals/Urinary Tract: Stable small left adrenal nodule. Small right adrenal myelolipoma. Kidneys enhance symmetrically with contrast. No hydronephrosis. Streak artifact limits evaluation of the pelvis. Stomach/Bowel: Re demonstrated circumferential wall thickening of the sigmoid colon and rectum. Small pericolonic lymph nodes are demonstrated. Normal appendix. Vascular/Lymphatic: Normal caliber abdominal aorta. No retroperitoneal lymphadenopathy. Reproductive: Streak artifact limits evaluation of the pelvis. Other: None. Musculoskeletal: Bilateral hip arthroplasties. Stable patchy sclerosis left ilium lumbar spine degenerative changes. No aggressive or acute appearing osseous lesions. IMPRESSION: 1. Interval development of metastatic disease including mediastinal and right hilar adenopathy, bilateral pulmonary nodules, right pleural effusion with suggestion of peritoneal nodularity, hepatic metastatic disease and splenic metastatic disease. 2. Re demonstrated wall thickening of the sigmoid colon and rectum which may be secondary to an inflammatory process. Electronically Signed   By: Lovey Newcomer M.D.   On: 01/28/2018 16:53   US Thoracentesis Asp Pleural Space W/img Guide  Result Date: 01/30/2018 INDICATION: Patient with history of breast cancer, dyspnea, right pleural effusion. Request made for diagnostic and therapeutic right thoracentesis. EXAM: ULTRASOUND GUIDED DIAGNOSTIC AND THERAPEUTIC RIGHT THORACENTESIS MEDICATIONS: None COMPLICATIONS: None immediate. PROCEDURE: An ultrasound guided thoracentesis was thoroughly discussed with the patient and questions answered. The benefits, risks, alternatives and complications were also discussed. The patient understands and wishes to proceed with the procedure. Written consent was obtained. Ultrasound was performed to localize and mark an adequate pocket of fluid in the right chest. The area was then prepped and draped in the normal sterile fashion. 1%  Lidocaine was used for local anesthesia. Under ultrasound guidance a 6 Fr Safe-T-Centesis catheter was introduced. Thoracentesis was performed. The catheter was removed and a dressing applied. FINDINGS: A total of approximately 500 cc of hazy, amber fluid was removed. Samples were sent to the laboratory as requested by the clinical team. IMPRESSION: Successful ultrasound guided diagnostic and therapeutic right thoracentesis yielding 500 cc of pleural fluid. Read by: Rowe Robert, PA-C Electronically Signed   By: Markus Daft M.D.   On: 01/30/2018 11:44

## 2018-01-31 ENCOUNTER — Other Ambulatory Visit: Payer: Self-pay | Admitting: Radiation Oncology

## 2018-01-31 ENCOUNTER — Ambulatory Visit
Admit: 2018-01-31 | Discharge: 2018-01-31 | Disposition: A | Discharge status: Home / Self Care | Payer: 59 | Attending: Radiation Oncology | Admitting: Radiation Oncology

## 2018-01-31 ENCOUNTER — Inpatient Hospital Stay (HOSPITAL_COMMUNITY): Payer: 59

## 2018-01-31 LAB — COMPREHENSIVE METABOLIC PANEL
ALT: 14 U/L (ref 0–44)
AST: 19 U/L (ref 15–41)
Albumin: 2.7 g/dL — ABNORMAL LOW (ref 3.5–5.0)
Alkaline Phosphatase: 61 U/L (ref 38–126)
Anion gap: 9 (ref 5–15)
BUN: 11 mg/dL (ref 6–20)
CO2: 21 mmol/L — AB (ref 22–32)
Calcium: 8.3 mg/dL — ABNORMAL LOW (ref 8.9–10.3)
Chloride: 107 mmol/L (ref 98–111)
Creatinine, Ser: 0.77 mg/dL (ref 0.44–1.00)
GFR calc non Af Amer: >60 mL/min (ref >60)
Glucose, Bld: 109 mg/dL — ABNORMAL HIGH (ref 70–99)
Potassium: 3.7 mmol/L (ref 3.5–5.1)
Sodium: 137 mmol/L (ref 135–145)
Total Bilirubin: 0.6 mg/dL (ref 0.3–1.2)
Total Protein: 6.3 g/dL — ABNORMAL LOW (ref 6.5–8.1)

## 2018-01-31 LAB — CBC WITH DIFFERENTIAL/PLATELET
Abs Immature Granulocytes: 0.02 10*3/uL (ref 0.00–0.07)
Basophils Absolute: 0 10*3/uL (ref 0.0–0.1)
Basophils Relative: 1 %
Eosinophils Absolute: 0.2 10*3/uL (ref 0.0–0.5)
Eosinophils Relative: 4 %
HCT: 35.8 % — ABNORMAL LOW (ref 36.0–46.0)
HEMOGLOBIN: 10.8 g/dL — AB (ref 12.0–15.0)
Immature Granulocytes: 0 %
LYMPHS PCT: 31 %
Lymphs Abs: 1.4 10*3/uL (ref 0.7–4.0)
MCH: 26.5 pg (ref 26.0–34.0)
MCHC: 30.2 g/dL (ref 30.0–36.0)
MCV: 88 fL (ref 80.0–100.0)
Monocytes Absolute: 0.6 10*3/uL (ref 0.1–1.0)
Monocytes Relative: 13 %
NEUTROS ABS: 2.4 10*3/uL (ref 1.7–7.7)
Neutrophils Relative %: 51 %
Platelets: 228 10*3/uL (ref 150–400)
RBC: 4.07 MIL/uL (ref 3.87–5.11)
RDW: 13.8 % (ref 11.5–15.5)
WBC: 4.6 10*3/uL (ref 4.0–10.5)
nRBC: 0 % (ref 0.0–0.2)

## 2018-01-31 LAB — GLUCOSE, CAPILLARY
GLUCOSE-CAPILLARY: 92 mg/dL (ref 70–99)
Glucose-Capillary: 95 mg/dL (ref 70–99)
Glucose-Capillary: 109 mg/dL — ABNORMAL HIGH (ref 70–99)
Glucose-Capillary: 97 mg/dL (ref 70–99)

## 2018-01-31 MED ORDER — FENTANYL CITRATE (PF) 100 MCG/2ML IJ SOLN
INTRAMUSCULAR | Status: AC
  Filled 2018-01-31: qty 2

## 2018-01-31 MED ORDER — MIDAZOLAM HCL 2 MG/2ML IJ SOLN
INTRAMUSCULAR | Status: AC | PRN
  Administered 2018-01-31 (×2): 1 mg via INTRAVENOUS

## 2018-01-31 MED ORDER — DEXAMETHASONE 4 MG PO TABS: 4.0000 mg | ORAL_TABLET | Freq: Two times a day (BID) | ORAL | 2 refills | Status: DC

## 2018-01-31 MED ORDER — APIXABAN 5 MG PO TABS: 5.0000 mg | ORAL_TABLET | Freq: Two times a day (BID) | ORAL | 1 refills | Status: DC

## 2018-01-31 MED ORDER — LIDOCAINE-EPINEPHRINE (PF) 2 %-1:200000 IJ SOLN
INTRAMUSCULAR | Status: AC
  Filled 2018-01-31: qty 20

## 2018-01-31 MED ORDER — METOPROLOL TARTRATE 25 MG PO TABS: 25.0000 mg | ORAL_TABLET | Freq: Two times a day (BID) | ORAL | 11 refills | Status: DC

## 2018-01-31 MED ORDER — OXYCODONE HCL 5 MG PO TABS
5.0000 mg | ORAL_TABLET | Freq: Once | ORAL | Status: AC
  Administered 2018-01-31: 5 mg via ORAL
  Filled 2018-01-31: qty 1

## 2018-01-31 MED ORDER — MIDAZOLAM HCL 2 MG/2ML IJ SOLN
INTRAMUSCULAR | Status: AC
  Filled 2018-01-31: qty 4

## 2018-01-31 MED ORDER — FENTANYL CITRATE (PF) 100 MCG/2ML IJ SOLN
INTRAMUSCULAR | Status: AC | PRN
  Administered 2018-01-31 (×2): 50 ug via INTRAVENOUS

## 2018-01-31 MED ORDER — APIXABAN 5 MG PO TABS: 5.0000 mg | ORAL_TABLET | Freq: Two times a day (BID) | ORAL | Status: DC

## 2018-01-31 MED ORDER — ATORVASTATIN CALCIUM 20 MG PO TABS: 20.0000 mg | ORAL_TABLET | Freq: Every day | ORAL | 1 refills | Status: DC

## 2018-01-31 MED FILL — ANASTROZOLE 1 MG TABLET: 1 | 30 days supply | Qty: 30 | Fill #1

## 2018-01-31 NOTE — Progress Notes (Signed)
ANTICOAGULATION CONSULT NOTE - Initial Consult  Pharmacy Consult for apixaban Indication: stroke  Allergies  Allergen Reactions  . Gadavist [Gadobutrol] Nausea And Vomiting    Severe vomiting within 1 min of injection.   . Lisinopril Palpitations  . Augmentin [Amoxicillin-Pot Clavulanate] Other (See Comments)    sts gives her a yeast infection Has patient had a PCN reaction causing immediate rash, facial/tongue/throat swelling, SOB or lightheadedness with hypotension: no Has patient had a PCN reaction causing severe rash involving mucus membranes or skin necrosis: no Has patient had a PCN reaction that required hospitalization no Has patient had a PCN reaction occurring within the last 10 years: unknown If all of the above answers are "NO", then may proceed with Cephalosporin use.   . Benadryl [Diphenhydramine] Itching and Anxiety    Per pt: "Makes my skin crawl"; makes pt sensitive to touch  . Losartan Potassium Palpitations    Patient Measurements: Height: 5\' 4"  (162.6 cm) Weight: 175 lb 3.2 oz (79.5 kg) IBW/kg (Calculated) : 54.7  Vital Signs: Temp: 98.3 F (36.8 C) (01/03 0835) Temp Source: Oral (01/03 0835) BP: 143/93 (01/03 0835) Pulse Rate: 98 (01/03 0835)  Labs: Recent Labs    01/30/18 1456 01/31/18 0342  HGB  --  10.8*  HCT  --  35.8*  PLT  --  228  LABPROT 13.0  --   INR 0.99  --   CREATININE  --  0.77    Estimated Creatinine Clearance: 77.2 mL/min (by C-G formula based on SCr of 0.77 mg/dL).   Medical History: Past Medical History:  Diagnosis Date  . Arthritis   . Asthma    triggered with [name] perfumes and cigarette smoke  . Cancer ([place])   . Colitis   . Diabetes mellitus without complication ([place])   . History of radiation therapy 11/22/16-01/10/17   left breast 50.4 Gy in 28 fractions, axillary region 45 Gy in 25 fractions, lumpectomy cavity boost 10 Gy tin 5 fractions  . Hypertension   . Neuropathy     Medications:  Medications  Prior to Admission  Medication Sig Dispense Refill Last Dose  . albuterol (PROVENTIL HFA;VENTOLIN HFA) 108 (90 Base) MCG/ACT inhaler Inhale 1-2 puffs into the lungs every 6 (six) hours as needed for wheezing or shortness of breath.   Past Month at Unknown time  . anastrozole (ARIMIDEX) 1 MG tablet Take 1 tablet (1 mg total) by mouth daily. 30 tablet 5 2 weeks  . aspirin EC 81 MG tablet Take 81 mg by mouth 2 (two) times daily.   01/25/2018 at Unknown time  . dicyclomine (BENTYL) 20 MG tablet Take 1 tablet (20 mg total) by mouth 4 (four) times daily -  before meals and at bedtime. (Patient taking differently: Take 20 mg by mouth 3 (three) times daily as needed for spasms. ) 30 tablet 0 unk  . gabapentin (NEURONTIN) 100 MG capsule Take 200 mg by mouth at bedtime.    01/24/2018 at Unknown time  . Garlic (GARLIQUE) 956 MG TBEC Take 400 mg by mouth daily.   01/25/2018 at Unknown time  . Ginkgo Biloba Extract 120 MG CAPS Take 120 mg by mouth daily with breakfast.   01/25/2018 at Unknown time  . loperamide (IMODIUM) 2 MG capsule Take 4 mg by mouth as needed for diarrhea or loose stools.   01/25/2018 at 1600  . Mesalamine 800 MG TBEC 2 TABLETS BY MOUTH THREE TIMES A DAY (Patient taking differently: Take 2 tablets by mouth 2 (two)  times daily. ) 180 tablet 2 01/25/2018 at Unknown time  . metFORMIN (GLUCOPHAGE-XR) 500 MG 24 hr tablet Take 1,000 mg by mouth at bedtime.   01/24/2018 at Unknown time  . metoprolol (LOPRESSOR) 50 MG tablet Take 50 mg by mouth 2 (two) times daily.    01/25/2018 at 0930  . potassium chloride SA (KLOR-CON M20) 20 MEQ tablet Take 1 tablet (20 mEq total) by mouth 2 (two) times daily. (Patient taking differently: Take 20 mEq by mouth daily with breakfast. ) 60 tablet 2 01/25/2018 at Unknown time  . Probiotic Product (PROBIOTIC-10) CAPS Take 2 capsules by mouth daily with breakfast.   01/25/2018 at Unknown time  . triamterene-hydrochlorothiazide (MAXZIDE) 75-50 MG per tablet Take 0.5  tablets by mouth daily with breakfast.    01/25/2018 at Unknown time  . meclizine (ANTIVERT) 25 MG tablet Take 1 tablet (25 mg total) by mouth 3 (three) times daily as needed for dizziness. (Patient not taking: Reported on 01/26/2018) 30 tablet 0 Not Taking at Unknown time    Assessment: Pharmacy consulted to dose Eliquis for stroke.  Pt to have liver biopsy today.  Per onc "MRI on admission showed small scattered acute infarcts in the bilateral cerebrum, R cerebellum and left posterior pons, suggestive of embolic disease -Per Dr. Erlinda Hong of neurology, patient can continue Lovenox ppx, given the anticipated procedures, and once she completes work-up, then she can transitioned to DOAC's"  Plan:  Eliquis 5 mg po bid to start tomorrow No loading dose 2nd liver biopsy today  Eudelia Bunch, Pharm.D [phone] 01/31/2018 11:56 AM

## 2018-01-31 NOTE — Progress Notes (Signed)
Pt called floor after discharge for anastrazole refill. Pt unable to get to outpatient pharmacy in time before closing. Per Dr. Marin Olp, onc on-call, 30 day supply called into [facility] [address], [place] [address]. Pt updated.

## 2018-01-31 NOTE — Procedures (Signed)
Pre Procedure Dx: Cervical lymphadenopathy Post Procedural Dx: Same  Technically successful US guided biopsy of indeterminate left supraclavicular lymph node.   EBL: None  No immediate complications.   Ronny Bacon, MD Pager #: [phone]

## 2018-01-31 NOTE — Discharge Instructions (Signed)

## 2018-01-31 NOTE — Progress Notes (Signed)
MEDICATION-RELATED CONSULT NOTE   IR Procedure Consult - Anticoagulant/Antiplatelet PTA/Inpatient Med List Review by Pharmacist   Procedure: US guided biopsy of indeterminate left supraclavicular lymph node.  Completed: 01/31/17 at 14:59  Post-Procedural bleeding risk per IR MD assessment:  Standard  Antithrombotic medications on inpatient or PTA profile prior to procedure:   Lovenox, last dose on 1/2.   Recommended restart time per IR Post-Procedure Guidelines:  Day after procedure.  Plan:     Start Apixaban on 1/4 as ordered.  Gretta Arab PharmD, BCPS Pager [phone] 01/31/2018 3:41 PM

## 2018-01-31 NOTE — Discharge Summary (Signed)
Physician Discharge Summary  Brandi Dickson VPC:[id number] DOB: [birth year] DOA: 01/26/2018  PCP: Orpah Melter, MD  Admit date: 01/26/2018 Discharge date: 01/31/2018  Admitted From: Home Disposition: Home Recommendations for Outpatient Follow-up:  1. Follow up with PCP in 1-2 weeks 2. Please obtain BMP/CBC in one week 3. Follow-up with [name] oncology 4. Follow-up with radiation oncology  Home Health patient refused home health PT Equipment/Devices: None Discharge Condition stable improved CODE STATUS full code Diet recommendation: Cardiac Brief/Interim Summary:58 year old female with stage II breast cancer, peripheral neuropathy, diabetes mellitus type 2 who presented on 12/28 with impaired left lateral gaze.  MRI brain showed small scattered acute infarct in the bilateral cerebrum, right cerebellum and left posterior pons which explains her visual symptoms.  Oncology consulted for metastatic breast cancer Discharge Diagnoses:  Principal Problem:   Brain metastases ([place]) Active Problems:   Hypertension   Stage IV breast cancer in female ([place])   Ulcerative colitis ([place])   Type 2 diabetes mellitus ([place])   Asthma   Neuropathy   Cerebral embolism with cerebral infarction   Cancer [facility])   Metastatic malignant neoplasm (HCC)   Goals of care, counseling/discussion  Bilateral punctuated CVA Likely hypercoagulable state from metastatic breast cancer.  CT of the head and neck also showed malignant appearing adenopathy in the chest and left lower neck, bilateral lobe subpleural nodules  suggestive of lymphatics spread of tumor with moderate right pleural effusion.  Brain MRI with contrast showed at least 14 focal lesions suggestive of malignancy. After discussion with oncology she was started on Lovenox for therapeutic stroke prevention start a DO AC prior to discharge today.  Patient is anxious to be discharged today after liver biopsy.  She will also be started on Lipitor 20 mg  daily her LDL was elevated at the time of admission.  Stage IV breast cancer with liver and brain mets. CT scan abdomen pelvis with metastatic disease including mediastinal, right hilar adenopathy, bilateral pulmonary nodules, right pleural effusion and peritoneal nodularity with metastatic hepatic and splenic disease.   Started on radiation therapy today.  Continue Arimidex daily. Right-sided diagnostic and therapeutic thoracentesis done with 500 cc hazy amber fluid.  Tolerated well.  Follow-up chest x-ray without any pneumothorax.  Fluid sent for cytology. Discussed with Dr. Maylon Peppers who is covering for patient's primary oncologist [name].  She recommended that pleural fluid may not be adequate to give definitive cytology result and liver biopsy should be beneficial.  Patient to have liver biopsy done today by interventional radiology and then discharged home.  Patient anxious to be going home today.   Diabetes mellitus type 2, controlled A1c of 5.9.  Restart metformin.  Essential hypertension Has been on an ACE inhibitor and a beta-blocker at home.  Her blood pressure here is soft and normal.  I will decrease the dose of beta-blocker prior to discharge and hold ACE inhibitor.  This may be restarted as an outpatient depending on her blood pressure.  Asthma Stable.  Continue inhaler.   Estimated body mass index is 30.07 kg/m as calculated from the following:   Height as of this encounter: 5' 4"  (1.626 m).   Weight as of this encounter: 79.5 kg.  Discharge Instructions  Discharge Instructions    Call MD for:  difficulty breathing, headache or visual disturbances   Complete by:  As directed    Call MD for:  persistant nausea and vomiting   Complete by:  As directed    Call MD for:  temperature >100.4   Complete by:  As directed    Diet - low sodium heart healthy   Complete by:  As directed    Increase activity slowly   Complete by:  As directed      Allergies as of 01/31/2018       Reactions   Gadavist [gadobutrol] Nausea And Vomiting   Severe vomiting within 1 min of injection.    Lisinopril Palpitations   Augmentin [amoxicillin-pot Clavulanate] Other (See Comments)   sts gives her a yeast infection Has patient had a PCN reaction causing immediate rash, facial/tongue/throat swelling, SOB or lightheadedness with hypotension: no Has patient had a PCN reaction causing severe rash involving mucus membranes or skin necrosis: no Has patient had a PCN reaction that required hospitalization no Has patient had a PCN reaction occurring within the last 10 years: unknown If all of the above answers are "NO", then may proceed with Cephalosporin use.   Benadryl [diphenhydramine] Itching, Anxiety   Per pt: "Makes my skin crawl"; makes pt sensitive to touch   Losartan Potassium Palpitations      Medication List    STOP taking these medications   meclizine 25 MG tablet Commonly known as:  ANTIVERT   triamterene-hydrochlorothiazide 75-50 MG tablet Commonly known as:  MAXZIDE     TAKE these medications   albuterol 108 (90 Base) MCG/ACT inhaler Commonly known as:  PROVENTIL HFA;VENTOLIN HFA Inhale 1-2 puffs into the lungs every 6 (six) hours as needed for wheezing or shortness of breath.   anastrozole 1 MG tablet Commonly known as:  ARIMIDEX Take 1 tablet (1 mg total) by mouth daily.   aspirin EC 81 MG tablet Take 81 mg by mouth 2 (two) times daily.   dicyclomine 20 MG tablet Commonly known as:  BENTYL Take 1 tablet (20 mg total) by mouth 4 (four) times daily -  before meals and at bedtime. What changed:    when to take this  reasons to take this   gabapentin 100 MG capsule Commonly known as:  NEURONTIN Take 200 mg by mouth at bedtime.   GARLIQUE 400 MG Tbec Generic drug:  Garlic Take 470 mg by mouth daily.   Ginkgo Biloba Extract 120 MG Caps Take 120 mg by mouth daily with breakfast.   loperamide 2 MG capsule Commonly known as:  IMODIUM Take 4 mg by  mouth as needed for diarrhea or loose stools.   Mesalamine 800 MG Tbec 2 TABLETS BY MOUTH THREE TIMES A DAY What changed:    how much to take  how to take this  when to take this  additional instructions   metFORMIN 500 MG 24 hr tablet Commonly known as:  GLUCOPHAGE-XR Take 1,000 mg by mouth at bedtime.   metoprolol tartrate 25 MG tablet Commonly known as:  LOPRESSOR Take 1 tablet (25 mg total) by mouth 2 (two) times daily. What changed:    medication strength  how much to take   potassium chloride SA 20 MEQ tablet Commonly known as:  KLOR-CON M20 Take 1 tablet (20 mEq total) by mouth 2 (two) times daily. What changed:  when to take this   PROBIOTIC-10 Caps Take 2 capsules by mouth daily with breakfast.      Follow-up Information    Orpah Melter, MD Follow up.   Specialty:  Family Medicine Contact information: [place] [address] [phone]        Truitt Merle, MD Follow up.   Specialties:  Hematology, Oncology Contact information: [place] [address] [phone]        Tyler Pita, MD Follow up.   Specialty:  Radiation Oncology Contact information: [place] [address] [phone]          Allergies  Allergen Reactions  . Gadavist [Gadobutrol] Nausea And Vomiting    Severe vomiting within 1 min of injection.   . Lisinopril Palpitations  . Augmentin [Amoxicillin-Pot Clavulanate] Other (See Comments)    sts gives her a yeast infection Has patient had a PCN reaction causing immediate rash, facial/tongue/throat swelling, SOB or lightheadedness with hypotension: no Has patient had a PCN reaction causing severe rash involving mucus membranes or skin necrosis: no Has patient had a PCN reaction that required hospitalization no Has patient had a PCN reaction occurring within the last 10 years: unknown If all of the above answers are "NO", then may proceed with  Cephalosporin use.   . Benadryl [Diphenhydramine] Itching and Anxiety    Per pt: "Makes my skin crawl"; makes pt sensitive to touch  . Losartan Potassium Palpitations    Consultations: Urology, oncology, radiation oncology, interventional radiology Procedures/Studies: Ct Angio Head W Or Wo Contrast  Result Date: 01/27/2018 CLINICAL DATA:  Stroke follow-up EXAM: CT ANGIOGRAPHY HEAD AND NECK TECHNIQUE: Multidetector CT imaging of the head and neck was performed using the standard protocol during bolus administration of intravenous contrast. Multiplanar CT image reconstructions and MIPs were obtained to evaluate the vascular anatomy. Carotid stenosis measurements (when applicable) are obtained utilizing NASCET criteria, using the distal internal carotid diameter as the denominator. CONTRAST:  112m ISOVUE-370 IOPAMIDOL (ISOVUE-370) INJECTION 76% COMPARISON:  Head CT and brain MRI from yesterday FINDINGS: CTA NECK FINDINGS Aortic arch: Normal appearance Right carotid system: Vessels are smooth and widely patent Left carotid system: Vessels are smooth and widely patent. No noted atheromatous changes Vertebral arteries: Vessels are smooth and widely patent. Early origin left PICA which branches from the V3 segment. Skeleton: Degenerative disease without acute finding Other neck: Enlarged lymph nodes in the low left posterior triangle and supraclavicular fossa. Incidental right thyroid nodule Upper chest: There is worrisome, centrally low-dense adenopathy in the bilateral mediastinum, thickest at the level of the subcarinal space where there is a 2 cm diameter. Layering right pleural effusion, at least moderate. There is subpleural nodularity and both upper lobes. Review of the MIP images confirms the above findings CTA HEAD FINDINGS Anterior circulation: Vessels are smooth and widely patent Posterior circulation: Vessels are smooth and widely patent. Hypoplastic left P1 segment Venous sinuses: Patent Anatomic  variants: As above Delayed phase: No abnormal intracranial enhancement Review of the MIP images confirms the above findings IMPRESSION: 1. Malignant-appearing adenopathy in the chest and left lower neck presumably related patient's history of breast cancer. Bilateral upper lobe subpleural nodularity suggesting lymphatic spread of tumor. Moderate right pleural effusion. 2. No significant vascular finding. Electronically Signed   By: JMonte FantasiaM.D.   On: 01/27/2018 09:59   Dg Chest 1 View  Result Date: 01/30/2018 CLINICAL DATA:  Post right thoracentesis EXAM: CHEST  1 VIEW COMPARISON:  11/28/2017 FINDINGS: Small moderate right pleural effusion with right base atelectasis. Left lung clear. Heart is normal size. No pneumothorax following thoracentesis. IMPRESSION: Small to moderate right pleural effusion with right lower lobe atelectasis. No pneumothorax following thoracentesis. Electronically Signed   By: KRolm BaptiseM.D.   On: 01/30/2018 11:19   Ct Head Wo Contrast  Result Date: 01/26/2018 CLINICAL DATA:  Acute onset of altered level of consciousness. Dizziness and double vision. Eyes drifting to the right. EXAM: CT HEAD WITHOUT CONTRAST TECHNIQUE: Contiguous axial images were obtained from the base of the skull through the vertex without intravenous contrast. COMPARISON:  MRI of the brain performed 11/28/2017 FINDINGS: Brain: No evidence of acute infarction, hemorrhage, hydrocephalus, extra-axial collection or mass lesion/mass effect. The posterior fossa, including the cerebellum, brainstem and fourth ventricle, is within normal limits. The third and lateral ventricles, and basal ganglia are unremarkable in appearance. The cerebral hemispheres are symmetric in appearance, with normal gray-white differentiation. No mass effect or midline shift is seen. Vascular: No hyperdense vessel or unexpected calcification. Skull: There is no evidence of fracture; visualized osseous structures are unremarkable in  appearance. Sinuses/Orbits: There is deviation of gaze to the right. The orbits are otherwise unremarkable. The paranasal sinuses and mastoid air cells are well-aerated. Other: No significant soft tissue abnormalities are seen. IMPRESSION: 1. No acute intracranial pathology seen on CT. 2. Deviation of gaze to the right, as clinically described. Electronically Signed   By: Garald Balding M.D.   On: 01/26/2018 01:56   Ct Angio Neck W Or Wo Contrast  Result Date: 01/27/2018 CLINICAL DATA:  Stroke follow-up EXAM: CT ANGIOGRAPHY HEAD AND NECK TECHNIQUE: Multidetector CT imaging of the head and neck was performed using the standard protocol during bolus administration of intravenous contrast. Multiplanar CT image reconstructions and MIPs were obtained to evaluate the vascular anatomy. Carotid stenosis measurements (when applicable) are obtained utilizing NASCET criteria, using the distal internal carotid diameter as the denominator. CONTRAST:  114m ISOVUE-370 IOPAMIDOL (ISOVUE-370) INJECTION 76% COMPARISON:  Head CT and brain MRI from yesterday FINDINGS: CTA NECK FINDINGS Aortic arch: Normal appearance Right carotid system: Vessels are smooth and widely patent Left carotid system: Vessels are smooth and widely patent. No noted atheromatous changes Vertebral arteries: Vessels are smooth and widely patent. Early origin left PICA which branches from the V3 segment. Skeleton: Degenerative disease without acute finding Other neck: Enlarged lymph nodes in the low left posterior triangle and supraclavicular fossa. Incidental right thyroid nodule Upper chest: There is worrisome, centrally low-dense adenopathy in the bilateral mediastinum, thickest at the level of the subcarinal space where there is a 2 cm diameter. Layering right pleural effusion, at least moderate. There is subpleural nodularity and both upper lobes. Review of the MIP images confirms the above findings CTA HEAD FINDINGS Anterior circulation: Vessels are  smooth and widely patent Posterior circulation: Vessels are smooth and widely patent. Hypoplastic left P1 segment Venous sinuses: Patent Anatomic variants: As above Delayed phase: No abnormal intracranial enhancement Review of the MIP images confirms the above findings IMPRESSION: 1. Malignant-appearing adenopathy in the chest and left lower neck presumably related patient's history of breast cancer. Bilateral upper lobe subpleural nodularity suggesting lymphatic spread of tumor. Moderate right pleural effusion. 2. No significant vascular finding. Electronically Signed   By: JMonte FantasiaM.D.   On: 01/27/2018 09:59   Ct Chest W Contrast  Result Date: 01/28/2018 CLINICAL DATA:  History of breast cancer. Evaluate for metastatic disease. EXAM: CT CHEST, ABDOMEN, AND PELVIS WITH CONTRAST TECHNIQUE: Multidetector CT imaging of the chest, abdomen and pelvis was performed following the standard protocol during bolus administration of intravenous contrast. CONTRAST:  1033mOMNIPAQUE IOHEXOL 300 MG/ML  SOLN COMPARISON:  Chest CT 05/13/2016; abdomen pelvic CT 05/31/2017 FINDINGS: CT CHEST FINDINGS Cardiovascular: Normal heart size.  Trace pericardial effusion. Mediastinum/Nodes: Interval development of mediastinal and right hilar adenopathy. Reference 2.1 cm  right paratracheal lymph node (image 17; series 3). Reference 2.5 cm subcarinal node (image 25; series 3). Reference 2.6 x 4.2 cm right pericardiophrenic mass (image 31; series 3). There is a 1.5 cm left supraclavicular lymph node (image 7; series 3). Lungs/Pleura: Irregular right hilar soft tissue measures 3.4 x 3.2 cm compatible with right hilar adenopathy. This markedly narrows the right middle and lower lobe bronchi. New moderate right pleural effusion. Possible enhancing pleural nodularity within the inferior right pleural space (image 41; series 3). Interval development of multiple bilateral pulmonary nodules. Reference 1.4 cm nodule in the lingula (image  91; series 5). Reference 0.4 cm nodule within the left lower lobe (image 104; series 5). Multiple right upper lobe subpleural nodules measuring up to 5 mm (image 46; series 5). No pneumothorax. Musculoskeletal: Thoracic spine degenerative changes. No aggressive or acute appearing osseous lesions. Postsurgical changes left breast. CT ABDOMEN PELVIS FINDINGS Hepatobiliary: Interval development of a 5.2 x 4.2 cm low-attenuation mass within the right hepatic lobe (image 41; series 3). There is an adjacent 1.5 cm low-attenuation nodule (image 39; series 3). New 2.5 x 1.6 cm low-attenuation mass right hepatic lobe (image 51; series 3). Additional small low-attenuation lesions are demonstrated within the liver. Gallbladder is unremarkable. No intrahepatic or extrahepatic biliary ductal dilatation. Pancreas: Unremarkable Spleen: Interval development of a 2.2 x 1.8 cm low-attenuation mass within the spleen (image 52; series 3). Adrenals/Urinary Tract: Stable small left adrenal nodule. Small right adrenal myelolipoma. Kidneys enhance symmetrically with contrast. No hydronephrosis. Streak artifact limits evaluation of the pelvis. Stomach/Bowel: Re demonstrated circumferential wall thickening of the sigmoid colon and rectum. Small pericolonic lymph nodes are demonstrated. Normal appendix. Vascular/Lymphatic: Normal caliber abdominal aorta. No retroperitoneal lymphadenopathy. Reproductive: Streak artifact limits evaluation of the pelvis. Other: None. Musculoskeletal: Bilateral hip arthroplasties. Stable patchy sclerosis left ilium lumbar spine degenerative changes. No aggressive or acute appearing osseous lesions. IMPRESSION: 1. Interval development of metastatic disease including mediastinal and right hilar adenopathy, bilateral pulmonary nodules, right pleural effusion with suggestion of peritoneal nodularity, hepatic metastatic disease and splenic metastatic disease. 2. Re demonstrated wall thickening of the sigmoid colon and  rectum which may be secondary to an inflammatory process. Electronically Signed   By: Lovey Newcomer M.D.   On: 01/28/2018 16:53   Mr Brain Wo Contrast  Result Date: 01/26/2018 CLINICAL DATA:  Blurred vision and difficulty walking EXAM: MRI HEAD WITHOUT CONTRAST TECHNIQUE: Multiplanar, multiecho pulse sequences of the brain and surrounding structures were obtained without intravenous contrast. COMPARISON:  Head CT from earlier today FINDINGS: Brain: Subcentimeter foci of restricted diffusion along the left para median floor of the fourth ventricle (near the facial colliculus), right cerebellum, bilateral occipital cortex, and bilateral posterior frontal cortex, and right centrum semiovale. No prior ischemic injury is noted. No hemorrhage, hydrocephalus, or masslike finding Vascular: Major flow voids are preserved Skull and upper cervical spine: Negative for marrow lesion Sinuses/Orbits: Negative IMPRESSION: Small scattered acute infarcts in the bilateral cerebrum, right cerebellum, and left posterior pons. The pontine infarct is near the facial colliculus and correlates with the visual complaints. The pattern suggests central embolic disease. Electronically Signed   By: Monte Fantasia M.D.   On: 01/26/2018 12:20   Mr Brain W Contrast  Result Date: 01/28/2018 CLINICAL DATA:  Invasive breast cancer, stage IV, recurrence. Abnormal MRI the brain. Blurred vision and difficulty walking. EXAM: MRI HEAD WITH CONTRAST TECHNIQUE: Multiplanar, multiecho pulse sequences of the brain and surrounding structures were obtained with intravenous contrast. CONTRAST:  8  mL Gadavist COMPARISON:  MRI brain 01/26/2018. CTA of the head and neck 01/27/2018 FINDINGS: Brain: A 4 mm enhancing lesion present posteriorly in the right cerebellum on image 17 of series 3. Two punctate foci of enhancement are present in the anterior inferior left cerebellum on image 14 of series 3. Two additional punctate foci are present at the inferior  aspect of the cerebellum bilaterally on image 11 of series 3. A punctate enhancing lesion is present at the inferior left temporal lobe on image 22 of series 3. A lesion in the left parietal white matter is best seen on images 36 and 37 of series 3. Cortical and subcortical punctate a lesions are present in the left parietal lobe images 42 and 41. An anterior right frontal lobe 3 mm enhancing nodule is present on image 43 of series 3. A linear cortical lesion measures 5.5 mm in the anterior right frontal lobe on image 41 of series 3. 3 mm lesion is present the anterior right frontal lobe white matter on image 38. A subcortical white matter lesion in the right occipital lobe measures 3 mm on image 32. The foci of enhancement are separate from the areas of restricted diffusion. The ventricles are of normal size. No significant extra-axial fluid collection is present. Vascular: Normal vascular enhancement is present Skull and upper cervical spine: Craniocervical junction is normal. Discrete osseous lesions are present. The upper cervical spine is within normal limits. Sinuses/Orbits: The paranasal sinuses and mastoid air cells are clear. IMPRESSION: 1. At least 14 focal enhancing lesions are present in the cerebrum and cerebellum. The largest lesion is in the anterior right frontal lobe measuring 5.5 mm. These are separate from the previously seen foci of restricted diffusion and likely represent diffuse metastases. 2. No enhancement associated with the areas of restricted diffusion previously seen. These are likely separate areas of embolic infarcts. Electronically Signed   By: San Morelle M.D.   On: 01/28/2018 11:07   Ct Abdomen Pelvis W Contrast  Result Date: 01/28/2018 CLINICAL DATA:  History of breast cancer. Evaluate for metastatic disease. EXAM: CT CHEST, ABDOMEN, AND PELVIS WITH CONTRAST TECHNIQUE: Multidetector CT imaging of the chest, abdomen and pelvis was performed following the standard  protocol during bolus administration of intravenous contrast. CONTRAST:  15m OMNIPAQUE IOHEXOL 300 MG/ML  SOLN COMPARISON:  Chest CT 05/13/2016; abdomen pelvic CT 05/31/2017 FINDINGS: CT CHEST FINDINGS Cardiovascular: Normal heart size.  Trace pericardial effusion. Mediastinum/Nodes: Interval development of mediastinal and right hilar adenopathy. Reference 2.1 cm right paratracheal lymph node (image 17; series 3). Reference 2.5 cm subcarinal node (image 25; series 3). Reference 2.6 x 4.2 cm right pericardiophrenic mass (image 31; series 3). There is a 1.5 cm left supraclavicular lymph node (image 7; series 3). Lungs/Pleura: Irregular right hilar soft tissue measures 3.4 x 3.2 cm compatible with right hilar adenopathy. This markedly narrows the right middle and lower lobe bronchi. New moderate right pleural effusion. Possible enhancing pleural nodularity within the inferior right pleural space (image 41; series 3). Interval development of multiple bilateral pulmonary nodules. Reference 1.4 cm nodule in the lingula (image 91; series 5). Reference 0.4 cm nodule within the left lower lobe (image 104; series 5). Multiple right upper lobe subpleural nodules measuring up to 5 mm (image 46; series 5). No pneumothorax. Musculoskeletal: Thoracic spine degenerative changes. No aggressive or acute appearing osseous lesions. Postsurgical changes left breast. CT ABDOMEN PELVIS FINDINGS Hepatobiliary: Interval development of a 5.2 x 4.2 cm low-attenuation mass within the right hepatic  lobe (image 41; series 3). There is an adjacent 1.5 cm low-attenuation nodule (image 39; series 3). New 2.5 x 1.6 cm low-attenuation mass right hepatic lobe (image 51; series 3). Additional small low-attenuation lesions are demonstrated within the liver. Gallbladder is unremarkable. No intrahepatic or extrahepatic biliary ductal dilatation. Pancreas: Unremarkable Spleen: Interval development of a 2.2 x 1.8 cm low-attenuation mass within the spleen  (image 52; series 3). Adrenals/Urinary Tract: Stable small left adrenal nodule. Small right adrenal myelolipoma. Kidneys enhance symmetrically with contrast. No hydronephrosis. Streak artifact limits evaluation of the pelvis. Stomach/Bowel: Re demonstrated circumferential wall thickening of the sigmoid colon and rectum. Small pericolonic lymph nodes are demonstrated. Normal appendix. Vascular/Lymphatic: Normal caliber abdominal aorta. No retroperitoneal lymphadenopathy. Reproductive: Streak artifact limits evaluation of the pelvis. Other: None. Musculoskeletal: Bilateral hip arthroplasties. Stable patchy sclerosis left ilium lumbar spine degenerative changes. No aggressive or acute appearing osseous lesions. IMPRESSION: 1. Interval development of metastatic disease including mediastinal and right hilar adenopathy, bilateral pulmonary nodules, right pleural effusion with suggestion of peritoneal nodularity, hepatic metastatic disease and splenic metastatic disease. 2. Re demonstrated wall thickening of the sigmoid colon and rectum which may be secondary to an inflammatory process. Electronically Signed   By: Lovey Newcomer M.D.   On: 01/28/2018 16:53   Vas [place] Lower Extremity Venous (dvt)  Result Date: 01/27/2018  Lower Venous Study Indications: Stroke.  Performing Technologist: Abram Sander RVS  Examination Guidelines: A complete evaluation includes B-mode imaging, spectral Doppler, color Doppler, and power Doppler as needed of all accessible portions of each vessel. Bilateral testing is considered an integral part of a complete examination. Limited examinations for reoccurring indications may be performed as noted.  Right Venous Findings: +---------+---------------+---------+-----------+----------+-------+          CompressibilityPhasicitySpontaneityPropertiesSummary +---------+---------------+---------+-----------+----------+-------+ CFV      Full           Yes      Yes                           +---------+---------------+---------+-----------+----------+-------+ SFJ      Full                                                 +---------+---------------+---------+-----------+----------+-------+ FV Prox  Full                                                 +---------+---------------+---------+-----------+----------+-------+ FV Mid   Full                                                 +---------+---------------+---------+-----------+----------+-------+ FV DistalFull                                                 +---------+---------------+---------+-----------+----------+-------+ PFV      Full                                                 +---------+---------------+---------+-----------+----------+-------+  POP      Full           Yes      Yes                          +---------+---------------+---------+-----------+----------+-------+ PTV      Full                                                 +---------+---------------+---------+-----------+----------+-------+ PERO     Full                                                 +---------+---------------+---------+-----------+----------+-------+  Left Venous Findings: +---------+---------------+---------+-----------+----------+-------+          CompressibilityPhasicitySpontaneityPropertiesSummary +---------+---------------+---------+-----------+----------+-------+ CFV      Full           Yes      Yes                          +---------+---------------+---------+-----------+----------+-------+ SFJ      Full                                                 +---------+---------------+---------+-----------+----------+-------+ FV Prox  Full                                                 +---------+---------------+---------+-----------+----------+-------+ FV Mid   Full                                                  +---------+---------------+---------+-----------+----------+-------+ FV DistalFull                                                 +---------+---------------+---------+-----------+----------+-------+ PFV      Full                                                 +---------+---------------+---------+-----------+----------+-------+ POP      Full           Yes      Yes                          +---------+---------------+---------+-----------+----------+-------+ PTV      Full                                                 +---------+---------------+---------+-----------+----------+-------+  PERO     Full                                                 +---------+---------------+---------+-----------+----------+-------+    Summary: Right: There is no evidence of deep vein thrombosis in the lower extremity. No cystic structure found in the popliteal fossa. Left: There is no evidence of deep vein thrombosis in the lower extremity. No cystic structure found in the popliteal fossa.  *See table(s) above for measurements and observations. Electronically signed by Deitra Mayo MD on 01/27/2018 at 5:13:11 PM.    Final    US Thoracentesis Asp Pleural Space W/img Guide  Result Date: 01/30/2018 INDICATION: Patient with history of breast cancer, dyspnea, right pleural effusion. Request made for diagnostic and therapeutic right thoracentesis. EXAM: ULTRASOUND GUIDED DIAGNOSTIC AND THERAPEUTIC RIGHT THORACENTESIS MEDICATIONS: None COMPLICATIONS: None immediate. PROCEDURE: An ultrasound guided thoracentesis was thoroughly discussed with the patient and questions answered. The benefits, risks, alternatives and complications were also discussed. The patient understands and wishes to proceed with the procedure. Written consent was obtained. Ultrasound was performed to localize and mark an adequate pocket of fluid in the right chest. The area was then prepped and draped in the normal sterile  fashion. 1% Lidocaine was used for local anesthesia. Under ultrasound guidance a 6 Fr Safe-T-Centesis catheter was introduced. Thoracentesis was performed. The catheter was removed and a dressing applied. FINDINGS: A total of approximately 500 cc of hazy, amber fluid was removed. Samples were sent to the laboratory as requested by the clinical team. IMPRESSION: Successful ultrasound guided diagnostic and therapeutic right thoracentesis yielding 500 cc of pleural fluid. Read by: Rowe Robert, PA-C Electronically Signed   By: Markus Daft M.D.   On: 01/30/2018 11:44    (Echo, Carotid, EGD, Colonoscopy, ERCP)    Subjective:   Discharge Exam: Vitals:   01/31/18 0425 01/31/18 0835  BP: 119/84 (!) 143/93  Pulse: 79 98  Resp: 17 17  Temp: 98.4 F (36.9 C) 98.3 F (36.8 C)  SpO2: 99% 97%   Vitals:   01/30/18 1334 01/30/18 2115 01/31/18 0425 01/31/18 0835  BP: 132/89 116/79 119/84 (!) 143/93  Pulse: 100 (!) 106 79 98  Resp: 20 16 17 17   Temp: 98.6 F (37 C) 98.9 F (37.2 C) 98.4 F (36.9 C) 98.3 F (36.8 C)  TempSrc: Oral Oral Oral Oral  SpO2: 100% 98% 99% 97%  Weight:      Height:        General: Pt is alert, awake, not in acute distress Cardiovascular: RRR, S1/S2 +, no rubs, no gallops Respiratory: CTA bilaterally, no wheezing, no rhonchi Abdominal: Soft, NT, ND, bowel sounds + Extremities: no edema, no cyanosis    The results of significant diagnostics from this hospitalization (including imaging, microbiology, ancillary and laboratory) are listed below for reference.     Microbiology: No results found for this or any previous visit (from the past 240 hour(s)).   Labs: BNP (last 3 results) No results for input(s): BNP in the last 8760 hours. Basic Metabolic Panel: Recent Labs  Lab 01/26/18 0051 01/28/18 0431 01/31/18 0342  NA 138 140 137  K 3.7 4.0 3.7  CL 103 105 107  CO2 27 25 21*  GLUCOSE 120* 95 109*  BUN 17 8 11   CREATININE 1.02* 0.84 0.77  CALCIUM 9.0  8.9 8.3*  Liver Function Tests: Recent Labs  Lab 01/26/18 0051 01/31/18 0342  AST 20 19  ALT 15 14  ALKPHOS 85 61  BILITOT 0.4 0.6  PROT 7.2 6.3*  ALBUMIN 3.3* 2.7*   No results for input(s): LIPASE, AMYLASE in the last 168 hours. No results for input(s): AMMONIA in the last 168 hours. CBC: Recent Labs  Lab 01/26/18 0051 01/28/18 0431 01/31/18 0342  WBC 7.8 4.7 4.6  NEUTROABS 5.2  --  2.4  HGB 12.1 11.5* 10.8*  HCT 39.7 37.5 35.8*  MCV 85.7 83.5 88.0  PLT 296 229 228   Cardiac Enzymes: No results for input(s): CKTOTAL, CKMB, CKMBINDEX, TROPONINI in the last 168 hours. BNP: Invalid input(s): POCBNP CBG: Recent Labs  Lab 01/30/18 1659 01/30/18 1726 01/30/18 2117 01/31/18 0740 01/31/18 0904  GLUCAP 53* 117* 99 95 92   D-Dimer No results for input(s): DDIMER in the last 72 hours. Hgb A1c No results for input(s): HGBA1C in the last 72 hours. Lipid Profile No results for input(s): CHOL, HDL, LDLCALC, TRIG, CHOLHDL, LDLDIRECT in the last 72 hours. Thyroid function studies No results for input(s): TSH, T4TOTAL, T3FREE, THYROIDAB in the last 72 hours.  Invalid input(s): FREET3 Anemia work up No results for input(s): VITAMINB12, FOLATE, FERRITIN, TIBC, IRON, RETICCTPCT in the last 72 hours. Urinalysis    Component Value Date/Time   COLORURINE YELLOW 11/28/2017 2059   APPEARANCEUR CLEAR 11/28/2017 2059   LABSPEC 1.011 11/28/2017 2059   PHURINE 6.0 11/28/2017 2059   GLUCOSEU NEGATIVE 11/28/2017 2059   HGBUR NEGATIVE 11/28/2017 2059   BILIRUBINUR NEGATIVE 11/28/2017 2059   [place] NEGATIVE 11/28/2017 2059   PROTEINUR NEGATIVE 11/28/2017 2059   UROBILINOGEN 0.2 12/30/2008 1317   NITRITE NEGATIVE 11/28/2017 2059   LEUKOCYTESUR TRACE (A) 11/28/2017 2059   Sepsis Labs Invalid input(s): PROCALCITONIN,  WBC,  LACTICIDVEN Microbiology No results found for this or any previous visit (from the past 240 hour(s)).   Time coordinating discharge: 34  minutes  SIGNED:   Georgette Shell, MD  [facility] 01/31/2018, 11:02 AM Pager   If 7PM-7AM, please contact night-coverage [email or web address] Password [email or web address]

## 2018-01-31 NOTE — Progress Notes (Signed)
Pt discharged home with care of husband. All pt belongings sent with pt. Discharge education completed with pt and husband including all medications and stroke education. No questions or concerns at this time. Biopsy site clean, dry and intact. VS WNL.

## 2018-01-31 NOTE — Progress Notes (Signed)
PT Cancellation Note  Patient Details Name: Brandi Dickson MRN: [id number] DOB: 02-19-1960   Cancelled Treatment:    Reason Eval/Treat Not Completed: Patient declined, no reason specified;Other (comment)(Pt going to radiation soon, and also must have liver biopsy today prior to d/c home today. Pt deferring PT, stating no PT needs prior to d/c. PT to check back with pt next week if not d/c.  )  Julien Girt, PT Acute Rehabilitation Services Pager [phone]  Office [phone]   [place] 01/31/2018, 12:41 PM

## 2018-01-31 NOTE — Care Management (Signed)
PT eval recommendations gone over with pt at bedside. Pt states she is "as strong as I always am" and doesn't need home health services. She states she has all equipment she needs at home too. Marney Doctor RN,BSN [phone]

## 2018-02-03 ENCOUNTER — Other Ambulatory Visit: Payer: 59

## 2018-02-03 ENCOUNTER — Ambulatory Visit
Admission: RE | Admit: 2018-02-03 | Discharge: 2018-02-03 | Disposition: A | Discharge status: Home / Self Care | Payer: 59 | Source: Ambulatory Visit | Attending: Radiation Oncology | Admitting: Radiation Oncology

## 2018-02-03 ENCOUNTER — Ambulatory Visit: Payer: 59 | Admitting: Hematology

## 2018-02-03 DIAGNOSIS — C778 Secondary and unspecified malignant neoplasm of lymph nodes of multiple regions: Secondary | ICD-10-CM | POA: Diagnosis not present

## 2018-02-03 DIAGNOSIS — K5190 Ulcerative colitis, unspecified, without complications: Secondary | ICD-10-CM | POA: Diagnosis not present

## 2018-02-03 DIAGNOSIS — Z9181 History of falling: Secondary | ICD-10-CM | POA: Diagnosis not present

## 2018-02-03 DIAGNOSIS — E860 Dehydration: Secondary | ICD-10-CM | POA: Diagnosis not present

## 2018-02-03 DIAGNOSIS — Z171 Estrogen receptor negative status [ER-]: Secondary | ICD-10-CM | POA: Diagnosis not present

## 2018-02-03 DIAGNOSIS — Z7901 Long term (current) use of anticoagulants: Secondary | ICD-10-CM | POA: Diagnosis not present

## 2018-02-03 DIAGNOSIS — I10 Essential (primary) hypertension: Secondary | ICD-10-CM | POA: Diagnosis not present

## 2018-02-03 DIAGNOSIS — E1142 Type 2 diabetes mellitus with diabetic polyneuropathy: Secondary | ICD-10-CM | POA: Diagnosis not present

## 2018-02-03 DIAGNOSIS — C782 Secondary malignant neoplasm of pleura: Secondary | ICD-10-CM | POA: Diagnosis not present

## 2018-02-03 DIAGNOSIS — Z9221 Personal history of antineoplastic chemotherapy: Secondary | ICD-10-CM | POA: Diagnosis not present

## 2018-02-03 DIAGNOSIS — Z5112 Encounter for antineoplastic immunotherapy: Secondary | ICD-10-CM | POA: Diagnosis not present

## 2018-02-03 DIAGNOSIS — I639 Cerebral infarction, unspecified: Secondary | ICD-10-CM | POA: Diagnosis not present

## 2018-02-03 DIAGNOSIS — C787 Secondary malignant neoplasm of liver and intrahepatic bile duct: Secondary | ICD-10-CM | POA: Diagnosis not present

## 2018-02-03 DIAGNOSIS — Z5111 Encounter for antineoplastic chemotherapy: Secondary | ICD-10-CM | POA: Diagnosis present

## 2018-02-03 DIAGNOSIS — M1990 Unspecified osteoarthritis, unspecified site: Secondary | ICD-10-CM | POA: Diagnosis not present

## 2018-02-03 DIAGNOSIS — Z923 Personal history of irradiation: Secondary | ICD-10-CM | POA: Diagnosis not present

## 2018-02-03 DIAGNOSIS — C7931 Secondary malignant neoplasm of brain: Secondary | ICD-10-CM | POA: Diagnosis not present

## 2018-02-03 DIAGNOSIS — K51919 Ulcerative colitis, unspecified with unspecified complications: Secondary | ICD-10-CM | POA: Diagnosis not present

## 2018-02-03 DIAGNOSIS — Z79899 Other long term (current) drug therapy: Secondary | ICD-10-CM | POA: Diagnosis not present

## 2018-02-03 DIAGNOSIS — Z79811 Long term (current) use of aromatase inhibitors: Secondary | ICD-10-CM | POA: Diagnosis not present

## 2018-02-03 DIAGNOSIS — C50412 Malignant neoplasm of upper-outer quadrant of left female breast: Secondary | ICD-10-CM | POA: Diagnosis present

## 2018-02-03 DIAGNOSIS — H532 Diplopia: Secondary | ICD-10-CM | POA: Diagnosis not present

## 2018-02-03 NOTE — Progress Notes (Signed)
[place]   [phone] Fax:([phone]) [phone]   Clinic Follow up Note   Patient Care Team: Orpah Melter, MD as PCP - General (Family Medicine) [name], MD as Consulting Physician (General Surgery) Truitt Merle, MD as Consulting Physician (Hematology) Gery Pray, MD as Consulting Physician (Radiation Oncology) Ladene Artist, MD as Consulting Physician (Gastroenterology) Delice Bison Charlestine Massed, NP as Nurse Practitioner (Hematology and Oncology)  Date of Service:  02/05/2018  CHIEF COMPLAINT: F/u of left breast cancer  SUMMARY OF ONCOLOGIC HISTORY: Oncology History   Cancer Staging Breast cancer of upper-outer quadrant of left female breast [facility]) Staging form: Breast, AJCC 8th Edition - Clinical stage from 04/25/2016: Stage IIA (cT2, cN0, cM0, G3, ER: Negative, PR: Negative, HER2: Positive) - Signed by Truitt Merle, MD on 05/02/2016 - Pathologic stage from 10/09/2016: No Stage Recommended (ypT2, pN1a, cM0, G3, ER+, PR-, HER2+) - Signed by Truitt Merle, MD on 07/11/2017       Breast cancer of upper-outer quadrant of left female breast ([place])   04/24/2016 Mammogram    Category B breasts with a new irregular mass in the UOQ left breast middle depth. Ultrasound revealed a 3.9 cm mass in the 1:00 position. The left axilla was negative.     04/25/2016 Initial Biopsy    Biopsy of the left breast showed grade 3 invasive ductal carcinoma, DCIS, and lymphovascular invasion was present.    04/25/2016 Receptors her2    ER 0% negative, PR 0% negative, HER2 positive, Ki67 30%    05/02/2016 Initial Diagnosis    Breast cancer of upper-outer quadrant of left female breast ([place])    05/07/2016 Imaging    MRI of the bilateral breast 05/07/16 IMPRESSION: Lobulated enhancing mass (4.0 x 2.7 x 3.1 cm) in the upper-outer quadrant of the left breast corresponding with the recently diagnosed invasive mammary carcinoma. Linear enhancement extends 2.6 cm posterior to the  mass worrisome for ductal carcinoma in-situ.    05/09/2016 Echocardiogram    Echo 05/09/16 -LF EF: 55-60%    05/11/2016 - 08/31/2016 Neo-Adjuvant Chemotherapy    Cycle 1 Docetaxel, Carboplatin, Herceptin and pejeta (TCHP), cycle 2 Docetaxel changed to Taxol, carbo dose reduced to AUC 4.5 (from 5) due to severe diarrhea and cytopenia, Perjeta held after cycle 2.   Due to poor toleration, [name] was held after cycle 4 and Taxol changed to weekly X4. Herceptin was continued during the above chemo.      05/13/2016 Imaging    CT Angio Chest PE IMPRESSION: No evidence of pulmonary emboli. Left breast mass consistent with the given clinical history. Stable left adrenal lesion likely representing a small adenoma.    05/18/2016 - 05/24/2016 Hospital Admission    Patient presented with nausea, vomiting, and diarrhea; admitted to hospital with Hyponatremia    06/07/2016 - 06/09/2016 Hospital Admission    Patient presents to hospital complaints of rectal bleeding and abdominal cramps when defacating    06/07/2016 Imaging    CT ABDOMEN PELVIS W CONTRAST  IMPRESSION: 1. Wall thickening of the descending and sigmoid colon consistent with an infectious or inflammatory colitis. 2. There is also a short segment of narrowing and possible wall thickening of the superior ascending colon which could reflect a constricting mass or, more likely, be an area of persistent colonic spasm. This could be further assessed with either colonoscopy or a barium enema after the current symptoms of colitis have resolved. 3. Small, subcentimeter, low-density liver lesions which may all be benign. However, 3 these are  not evident on prior CT. Liver metastatic disease possible. These could be further assessed with liver MRI with and without contrast. 4. Adrenal lesions which are stable, on the right and myelolipoma and on the left most likely an adenoma.    06/07/2016 Imaging    CT A/P IMPRESSION: 1. Wall thickening of the  descending and sigmoid colon consistent with an infectious or inflammatory colitis. 2. There is also a short segment of narrowing and possible wall thickening of the superior ascending colon which could reflect a constricting mass or, more likely, be an area of persistent colonic spasm. This could be further assessed with either colonoscopy or a barium enema after the current symptoms of colitis have resolved. 3. Small, subcentimeter, low-density liver lesions which may all be benign. However, 3 these are not evident on prior CT. Liver metastatic disease possible. These could be further assessed with liver MRI with and without contrast. 4. Adrenal lesions which are stable, on the right and myelolipoma and on the left most likely an adenoma.    06/07/2016 - 06/09/2016 Hospital Admission    Diarrhea and rectal Bleeding    07/19/2016 - 07/20/2016 Hospital Admission    Diarrhea and dehydration    08/09/2016 Mammogram    Mammogram 08/09/16 IMPRESSION:  The 3.1 cm x 2 cm x 1.5 cm irregular equal density mass in the left breast is consistent with the known carcinoma showing mammographic evidence of preoperative chemotherapy response.       08/09/2016 Imaging    [place] of left breast 08/09/16 IMPRESSION:  The 2.7 cm lobulated mass in the left breast is a known biopsy positive for malignancy. Surgical and oncology consult in progress.    08/16/2016 Imaging    MRI Abdomen W WO Contrast IMPRESSION: Tiny sub-cm hepatic cysts. No evidence of metastatic disease or other acute findings. Tiny benign left adrenal adenoma and right adrenal myelolipoma.    09/14/2016 - 05/10/2017 Chemotherapy    Maintenance Herceptin every 3 weeks. Added Perjeta back on 10/26/16. Completed on 05/10/17    09/17/2016 Imaging    MRI Breast Bilateral 09/17/16 IMPRESSION: Smaller left breast mass, now measuring 2.5 cm. No new or suspicious enhancement in either breast.  RECOMMENDATION: Treatment plan.    10/09/2016 Surgery     LEFT BREAST LUMPECTOMY WITH RADIOACTIVE SEED AND L4EFT AXILLARY SENTINEL LYMPH NODE BIOPSY by Dr. Lucia Gaskins on 10/09/16    10/09/2016 Pathology Results    Diagnosis 10/09/16 1. Breast, lumpectomy, Left - INVASIVE DUCTAL CARCINOMA, GRADE 3, SPANNING 2.4 CM. - HIGH GRADE DUCTAL CARCINOMA IN SITU WITH NECROSIS. - RESECTION MARGINS ARE NEGATIVE FOR CARCINOMA. - BIOPSY SITE. - SEE ONCOLOGY TABLE. 2. Breast, excision, Left additional medial margin - FIBROCYSTIC CHANGE. - NO MALIGNANCY IDENTIFIED. 3. Lymph node, sentinel, biopsy, Left Axillary - METASTATIC CARCINOMA IN ONE OF ONE LYMPH NODES (1/1).      11/22/2016 - 01/10/2017 Radiation Therapy    Radiation with Dr. Sondra Come and complete on 01/10/17    11/22/2016 - 05/15/2017 Chemotherapy    Xeloda With radiation she will be on low dose 4 tablets in the am and 3 tablets in the pm. Starting 11/22/16 and complete on 01/10/17.   Will continue Xeloda, 14 days on and 7 days off, 4 tablets BID starting 01/18/17 and completed on 05/15/17.  -Due to severe diarrhea, her Xeloda dose was decreased from 2000 mg twice daily to 2017m in am and 15023min pm on 02/11/17.      03/12/2017 Echocardiogram    ECHO 03/12/17  Impressions: - Normal LV size with EF 55-60%. Strain as above. Normal RV size   and systolic function. No significant valvular abnormalities.    04/18/2017 Mammogram    IMPRESSION:  New post treatment changes of the left breast. There is no mammographic evidence of malignancy. A follow-up mammogram in 12 months is recommended.     06/10/2017 Echocardiogram    ECHO 06/10/2017 LV EF: 55% -   60%    06/2017 - 12/2017 Anti-estrogen oral therapy    Anastrozole 71m daily, starting 06/2017. Stopped in 12/2017 due to cancer recurrence    07/20/2017 Imaging    DEXA Scan WNLs    07/20/2017 Imaging    DEXA Scan with T-score of 0.4    01/27/2018 Imaging    CT Angio Head WO Contrast 01/27/18  IMPRESSION: 1. Malignant-appearing adenopathy in the chest  and left lower neck presumably related patient's history of breast cancer. Bilateral upper lobe subpleural nodularity suggesting lymphatic spread of tumor. Moderate right pleural effusion. 2. No significant vascular finding.    01/28/2018 Imaging    MRI BRain W Contrast 01/28/18  IMPRESSION: 1. At least 14 focal enhancing lesions are present in the cerebrum and cerebellum. The largest lesion is in the anterior right frontal lobe measuring 5.5 mm. These are separate from the previously seen foci of restricted diffusion and likely represent diffuse metastases. 2. No enhancement associated with the areas of restricted diffusion previously seen. These are likely separate areas of embolic infarcts.    01/28/2018 Imaging    CT CAP W Contast 01/28/18  IMPRESSION: 1. Interval development of metastatic disease including mediastinal and right hilar adenopathy, bilateral pulmonary nodules, right pleural effusion with suggestion of peritoneal nodularity, hepatic metastatic disease and splenic metastatic disease. 2. Re demonstrated wall thickening of the sigmoid colon and rectum which may be secondary to an inflammatory process.    12/2017 Relapse/Recurrence    During hospitalization for stroke, her workup showed evidence of diffuse metastatic disease from previous breast cancer. Cervical LN Biopsy confimed this.     01/30/2018 Pathology Results    Thoracentesis Cytology 01/30/18 Diagnosis PLEURAL FLUID, RIGHT (SPECIMEN 1 OF 1 COLLECTED 01/30/18): MALIGNANT CELLS CONSISTENT WITH METASTATIC ADENOCARCINOMA.    01/30/2018 -  Radiation Therapy    RT with Dr. MTammi Klippelon 01/30/18 and plans to complete on 02/18/18    01/31/2018 Pathology Results    Diagnosis 01/31/18 Lymph node, needle/core biopsy, cervical - METASTATIC CARCINOMA CONSISTENT WITH BREAST PRIMARY. Microscopic Comment The carcinoma with immunohistochemistry is positive with GATA-3 and negative with ER, PR and GCDFP. The morphology and  immunophenotype are consistent with metastatic breast carcinoma.      CURRENT THERAPY:   WBRT with Dr. MTammi Klippelon 01/30/18 and plans to complete on 02/18/18  PENDING Kadcyla every 3 weeks starting 02/21/18   INTERVAL HISTORY:  Brandi BERHEis here for a follow up of left breast cancer after recent hospitalization for stroke from possible embolism. Her workup showed evidence of metastatic cancer and her LN biopsy recently confirmed this.  She presents to the clinic today accompanied by her husband. She notes she is still has double vision in certain positions. Given her vision and brain mets she is has not been driving. She notes completing her FMLA before applying to disability.      REVIEW OF SYSTEMS:   Constitutional: Denies fevers, chills or abnormal weight loss Eyes: Denies blurriness of vision (+) double vision in certain positions  Ears, nose, mouth, throat, and face: Denies mucositis  or sore throat Respiratory: Denies cough, dyspnea or wheezes Cardiovascular: Denies palpitation, chest discomfort or lower extremity swelling Gastrointestinal:  Denies nausea, heartburn or change in bowel habits Skin: Denies abnormal skin rashes Lymphatics: Denies new lymphadenopathy or easy bruising Neurological:Denies numbness, tingling or new weaknesses (+) loss of memory Behavioral/Psych: Mood is stable, no new changes  All other systems were reviewed with the patient and are negative.  MEDICAL HISTORY:  Past Medical History:  Diagnosis Date  . Arthritis   . Asthma    triggered with [name] perfumes and cigarette smoke  . Cancer ([place])   . Colitis   . Diabetes mellitus without complication ([place])   . History of radiation therapy 11/22/16-01/10/17   left breast 50.4 Gy in 28 fractions, axillary region 45 Gy in 25 fractions, lumpectomy cavity boost 10 Gy tin 5 fractions  . Hypertension   . Neuropathy     SURGICAL HISTORY: Past Surgical History:  Procedure Laterality Date  .  ABDOMINAL HYSTERECTOMY    . BREAST LUMPECTOMY WITH RADIOACTIVE SEED AND SENTINEL LYMPH NODE BIOPSY Left 10/09/2016   Procedure: LEFT BREAST LUMPECTOMY WITH RADIOACTIVE SEED AND L4EFT AXILLARY SENTINEL LYMPH NODE BIOPSY;  Surgeon: [name], MD;  Location: [facility];  Service: General;  Laterality: Left;  . DILATION AND CURETTAGE OF UTERUS    . KNEE ARTHROSCOPY Left   . PORTACATH PLACEMENT Right 05/08/2016   Procedure: INSERTION PORT-A-CATH WITH [place];  Surgeon: [name], MD;  Location: [facility];  Service: General;  Laterality: Right;  . TONSILLECTOMY    . TOTAL HIP ARTHROPLASTY Right   . TOTAL HIP ARTHROPLASTY Left 03/06/2016   Procedure: LEFT TOTAL HIP ARTHROPLASTY ANTERIOR APPROACH;  Surgeon: [name], MD;  Location: [facility];  Service: Orthopedics;  Laterality: Left;    I have reviewed the social history and family history with the patient and they are unchanged from previous note.  ALLERGIES:  is allergic to gadavist [gadobutrol]; lisinopril; augmentin [amoxicillin-pot clavulanate]; benadryl [diphenhydramine]; and losartan potassium.  MEDICATIONS:  Current Outpatient Medications  Medication Sig Dispense Refill  . albuterol (PROVENTIL HFA;VENTOLIN HFA) 108 (90 Base) MCG/ACT inhaler Inhale 1-2 puffs into the lungs every 6 (six) hours as needed for wheezing or shortness of breath.    . anastrozole (ARIMIDEX) 1 MG tablet Take 1 tablet (1 mg total) by mouth daily. 30 tablet 5  . apixaban (ELIQUIS) 5 MG TABS tablet Take 1 tablet (5 mg total) by mouth 2 (two) times daily. 60 tablet 1  . aspirin EC 81 MG tablet Take 81 mg by mouth 2 (two) times daily.    [name] Kitchen atorvastatin (LIPITOR) 20 MG tablet Take 1 tablet (20 mg total) by mouth daily. 30 tablet 1  . dexamethasone (DECADRON) 4 MG tablet Take 1 tablet (4 mg total) by mouth 2 (two) times daily with a meal. 40 tablet 2  . dicyclomine (BENTYL) 20 MG tablet Take 1 tablet (20 mg total) by mouth 4 (four) times daily  -  before meals and at bedtime. (Patient taking differently: Take 20 mg by mouth 3 (three) times daily as needed for spasms. ) 30 tablet 0  . gabapentin (NEURONTIN) 100 MG capsule Take 200 mg by mouth at bedtime.     . Garlic (GARLIQUE) 253 MG TBEC Take 400 mg by mouth daily.    . Ginkgo Biloba Extract 120 MG CAPS Take 120 mg by mouth daily with breakfast.    . loperamide (IMODIUM) 2 MG capsule Take 4 mg by  mouth as needed for diarrhea or loose stools.    . Mesalamine 800 MG TBEC 2 TABLETS BY MOUTH THREE TIMES A DAY (Patient taking differently: Take 2 tablets by mouth 2 (two) times daily. ) 180 tablet 2  . metFORMIN (GLUCOPHAGE-XR) 500 MG 24 hr tablet Take 1,000 mg by mouth at bedtime.    . metoprolol tartrate (LOPRESSOR) 25 MG tablet Take 1 tablet (25 mg total) by mouth 2 (two) times daily. 60 tablet 11  . potassium chloride SA (KLOR-CON M20) 20 MEQ tablet Take 1 tablet (20 mEq total) by mouth 2 (two) times daily. (Patient taking differently: Take 20 mEq by mouth daily with breakfast. ) 60 tablet 2  . Probiotic Product (PROBIOTIC-10) CAPS Take 2 capsules by mouth daily with breakfast.     No current facility-administered medications for this visit.    Facility-Administered Medications Ordered in Other Visits  Medication Dose Route Frequency Provider Last Rate Last Dose  . heparin lock flush 100 unit/mL  500 Units Intracatheter Once PRN Truitt Merle, MD      . sodium chloride flush (NS) 0.9 % injection 10 mL  10 mL Intracatheter PRN Truitt Merle, MD      . sodium chloride flush (NS) 0.9 % injection 10 mL  10 mL Intracatheter PRN Truitt Merle, MD   10 mL at 08/24/16 1725  . sodium chloride flush (NS) 0.9 % injection 10 mL  10 mL Intracatheter PRN Truitt Merle, MD   10 mL at 11/16/16 1628    PHYSICAL EXAMINATION: ECOG PERFORMANCE STATUS: 2 - Symptomatic, <50% confined to bed  Vitals:   02/05/18 1258 02/05/18 1300  BP: (!) 145/95 (!) 144/84  Pulse: 81   Resp: 17   Temp: 98.1 F (36.7 C)   SpO2: 96%      Filed Weights   02/05/18 1258  Weight: 173 lb 9.6 oz (78.7 kg)    GENERAL:alert, no distress and comfortable SKIN: skin color, texture, turgor are normal, no rashes or significant lesions EYES: normal, Conjunctiva are pink and non-injected, sclera clear OROPHARYNX:no exudate, no erythema and lips, buccal mucosa, and tongue normal  NECK: supple, thyroid normal size, non-tender, without nodularity LYMPH:  no palpable lymphadenopathy in the cervical, axillary or inguinal LUNGS: clear to auscultation and percussion with normal breathing effort HEART: regular rate & rhythm and no murmurs and no lower extremity edema ABDOMEN:abdomen soft, non-tender and normal bowel sounds Musculoskeletal:no cyanosis of digits and no clubbing  NEURO: alert & oriented x 3 with fluent speech, no focal motor/sensory deficits  LABORATORY DATA:  I have reviewed the data as listed CBC Latest Ref Rng & Units 01/31/2018 01/28/2018 01/26/2018  WBC 4.0 - 10.5 K/uL 4.6 4.7 7.8  Hemoglobin 12.0 - 15.0 g/dL 10.8(L) 11.5(L) 12.1  Hematocrit 36.0 - 46.0 % 35.8(L) 37.5 39.7  Platelets 150 - 400 K/uL 228 229 296     CMP Latest Ref Rng & Units 01/31/2018 01/28/2018 01/26/2018  Glucose 70 - 99 mg/dL 109(H) 95 120(H)  BUN 6 - 20 mg/dL 11 8 17   Creatinine 0.44 - 1.00 mg/dL 0.77 0.84 1.02(H)  Sodium 135 - 145 mmol/L 137 140 138  Potassium 3.5 - 5.1 mmol/L 3.7 4.0 3.7  Chloride 98 - 111 mmol/L 107 105 103  CO2 22 - 32 mmol/L 21(L) 25 27  Calcium 8.9 - 10.3 mg/dL 8.3(L) 8.9 9.0  Total Protein 6.5 - 8.1 g/dL 6.3(L) - 7.2  Total Bilirubin 0.3 - 1.2 mg/dL 0.6 - 0.4  Alkaline Phos 38 -  126 U/L 61 - 85  AST 15 - 41 U/L 19 - 20  ALT 0 - 44 U/L 14 - 15      RADIOGRAPHIC STUDIES: I have personally reviewed the radiological images as listed and agreed with the findings in the report. No results found.   ASSESSMENT & PLAN:  JOUD PETTINATO is a 58 y.o. female with    1. Breast cancer of upper-outer quadrant of left  breast, invasive ductal carcinoma, stage IIA (cT2N0M0) grade 3, ER-, PR-, HER2 amplified, ypT2N1a, ER30% weakly+, PR-, HER2+, Metastatic to brain, liver, pleura and nodes in 12/2017, ER-/PR- -She was initially diagnosed in 03/2016. She is s/p neo-adjuvant TCHP with one year maintenance Herceptin and Perjeta, Left breast lumpectomy, Adjuvant chemoradiation and adjuvant Xeloda.  -She started antiestrogen therapy with Anastrozole in 06/2017, tolerated well.  -She was hospitalized on 01/26/18 for Bilateral multifocal stroke, embolic pattern.  CT and MRI unfortunately showed diffuse brain, cold, liver, and pleura metastasis -I discussed the cytology from right thoracentesis, and lymph node biopsy, both confirmed metastatic adenocarcinoma.  ER and PR were negative, HER-2 still pending. -We discussed her CT CAP and Brain MRI which shows metastasis to brain, liver and pleural cavity with LN involvement.  -We discussed her thoracentesis, cervical LN and liver biopsy show metastatic ER/PR negative disease from her prior breast cancer. Her HER2 marker is still pending.   -Given her diffuse metastatic disease I discussed her cancer is not curable but still treatable. We discussed the prognosis  -She has started whole brain radiation with Dr. Tammi Klippel on 01/30/18 and plans to complete on 02/18/18. She is tolerating well with mild memory issues.  -I discussed systemic treatment including chemo and HER2 anti-bodies. I discussed there will be more HER2 antibody treatment options be approved in the near future. She has received TCHP as different and adjuvant therapy, last dose in April 2019.  So I recommend her to start Kadcyla after brain RT, if the metastatic disease is confirmed as a HER-2 positive. -Goal of therapy is to control her disease and to prolong her life. Will continue for as long as she can tolerate and will give chemo break as needed or requested.  -Will get PAC placed in the next 1-2 months (she is currently on  [facility])  -I discussed working towards healthy diet, exercise and manageable weight and positive attitude to help overall improve her immune system -I strongly encouraged her to apply for longterm disability after her FMLA ends. I will send message to SW.  -F/u in 2 weeks, plan to start [place] on 1/24   2. Genetics -Her family history is concerning for Lynch Syndrome, she previously declined genetic testing.   3. Type 2 Diabetes mellitus, HTN -Managed by her PCP. -DM and HTN, controlled.   4. Ulcerative colitis and chronic diarrhea  -She is not taking steroids for this. She is on mesalamine. Continue to follow up with Dr. Fuller Plan  -I recommend a repeat coloscopy in 2020.  -Her diarrhea is controlled with imodium, stable.   5. Arthritis -I previously encouraged her to f/u with PCP for arthritic pain management. Plans to see him about her right knee -Will monitor while on Anastrozole.   6. Peripheral Neuropathy, secondary to DM and worsened with  chemotherapy  -On Gabapentin 200 mg at night, may increase to 300-400 mg at bedtime as needed. Continue Vitamin B12. -Stable, mainly in feet.    7. Bone Health  -Her DEXA on 07/20/17 was WNLs -Will monitor every  2 years.   8. Bilateral multifocal stroke, embolic pattern  -On Eliquis  -Followed by neurologist  -Residual blurred and double vision   9. Right Pleural Effusion  -She had Thoracentesis on 01/30/17 -I discussed given her pleural cavity mets, this will likely recur -Will monitor. If she develops significant SOB or significant effusion indicated on exam, will repeat thoracentesis.   10. Goal of care discussion  -We again discussed the incurable nature of her cancer, and the overall poor prognosis, especially if she does not have good response to chemotherapy or progress on chemo -The patient understands the goal of care is palliative. -I recommend DNR/DNI, she will think about it    PLAN She will continue RT I will  request FO on her recent node biopsy Lab and f/u on 1/21, Kadcyla on 1/24   No problem-specific Assessment & Plan notes found for this encounter.   No orders of the defined types were placed in this encounter.  All questions were answered. The patient knows to call the clinic with any problems, questions or concerns. No barriers to learning was detected. I spent 30 minutes counseling the patient face to face. The total time spent in the appointment was 40 minutes and more than 50% was on counseling and review of test results     Truitt Merle, MD  02/05/2018   I, Joslyn Devon, am acting as scribe for Truitt Merle, MD.   I have reviewed the above documentation for accuracy and completeness, and I agree with the above.

## 2018-02-04 ENCOUNTER — Ambulatory Visit
Admission: RE | Admit: 2018-02-04 | Discharge: 2018-02-04 | Disposition: A | Discharge status: Home / Self Care | Payer: 59 | Source: Ambulatory Visit | Attending: Radiation Oncology | Admitting: Radiation Oncology

## 2018-02-04 ENCOUNTER — Telehealth: Payer: Self-pay | Admitting: Hematology

## 2018-02-04 DIAGNOSIS — Z5112 Encounter for antineoplastic immunotherapy: Secondary | ICD-10-CM | POA: Diagnosis not present

## 2018-02-04 NOTE — Telephone Encounter (Signed)
Scheduled appt per 1/6 sch message - pt is aware of appt date and time   

## 2018-02-05 ENCOUNTER — Encounter: Payer: Self-pay | Admitting: Hematology

## 2018-02-05 ENCOUNTER — Telehealth: Payer: Self-pay

## 2018-02-05 ENCOUNTER — Inpatient Hospital Stay: Payer: 59 | Attending: Hematology | Admitting: Hematology

## 2018-02-05 ENCOUNTER — Ambulatory Visit
Admission: RE | Admit: 2018-02-05 | Discharge: 2018-02-05 | Disposition: A | Discharge status: Home / Self Care | Payer: 59 | Source: Ambulatory Visit | Attending: Radiation Oncology | Admitting: Radiation Oncology

## 2018-02-05 VITALS — BP 144/84 | HR 81 | Temp 98.1°F | Resp 17 | Ht 64.0 in | Wt 173.6 lb

## 2018-02-05 DIAGNOSIS — C50412 Malignant neoplasm of upper-outer quadrant of left female breast: Secondary | ICD-10-CM | POA: Insufficient documentation

## 2018-02-05 DIAGNOSIS — I6340 Cerebral infarction due to embolism of unspecified cerebral artery: Secondary | ICD-10-CM

## 2018-02-05 DIAGNOSIS — C7931 Secondary malignant neoplasm of brain: Secondary | ICD-10-CM | POA: Insufficient documentation

## 2018-02-05 DIAGNOSIS — Z8673 Personal history of transient ischemic attack (TIA), and cerebral infarction without residual deficits: Secondary | ICD-10-CM

## 2018-02-05 DIAGNOSIS — Z7189 Other specified counseling: Secondary | ICD-10-CM

## 2018-02-05 DIAGNOSIS — Z5112 Encounter for antineoplastic immunotherapy: Secondary | ICD-10-CM | POA: Diagnosis not present

## 2018-02-05 DIAGNOSIS — Z79899 Other long term (current) drug therapy: Secondary | ICD-10-CM | POA: Insufficient documentation

## 2018-02-05 DIAGNOSIS — H532 Diplopia: Secondary | ICD-10-CM | POA: Insufficient documentation

## 2018-02-05 DIAGNOSIS — Z9221 Personal history of antineoplastic chemotherapy: Secondary | ICD-10-CM | POA: Insufficient documentation

## 2018-02-05 DIAGNOSIS — I639 Cerebral infarction, unspecified: Secondary | ICD-10-CM | POA: Insufficient documentation

## 2018-02-05 DIAGNOSIS — E1142 Type 2 diabetes mellitus with diabetic polyneuropathy: Secondary | ICD-10-CM | POA: Insufficient documentation

## 2018-02-05 DIAGNOSIS — C782 Secondary malignant neoplasm of pleura: Secondary | ICD-10-CM | POA: Insufficient documentation

## 2018-02-05 DIAGNOSIS — Z9181 History of falling: Secondary | ICD-10-CM | POA: Insufficient documentation

## 2018-02-05 DIAGNOSIS — E860 Dehydration: Secondary | ICD-10-CM | POA: Insufficient documentation

## 2018-02-05 DIAGNOSIS — Z170 Estrogen receptor positive status [ER+]: Secondary | ICD-10-CM

## 2018-02-05 DIAGNOSIS — Z923 Personal history of irradiation: Secondary | ICD-10-CM | POA: Insufficient documentation

## 2018-02-05 DIAGNOSIS — Z171 Estrogen receptor negative status [ER-]: Secondary | ICD-10-CM | POA: Insufficient documentation

## 2018-02-05 DIAGNOSIS — Z79811 Long term (current) use of aromatase inhibitors: Secondary | ICD-10-CM | POA: Insufficient documentation

## 2018-02-05 DIAGNOSIS — J90 Pleural effusion, not elsewhere classified: Secondary | ICD-10-CM

## 2018-02-05 DIAGNOSIS — K51919 Ulcerative colitis, unspecified with unspecified complications: Secondary | ICD-10-CM | POA: Diagnosis not present

## 2018-02-05 DIAGNOSIS — K5190 Ulcerative colitis, unspecified, without complications: Secondary | ICD-10-CM | POA: Insufficient documentation

## 2018-02-05 DIAGNOSIS — C778 Secondary and unspecified malignant neoplasm of lymph nodes of multiple regions: Secondary | ICD-10-CM | POA: Insufficient documentation

## 2018-02-05 DIAGNOSIS — I10 Essential (primary) hypertension: Secondary | ICD-10-CM | POA: Insufficient documentation

## 2018-02-05 DIAGNOSIS — M1990 Unspecified osteoarthritis, unspecified site: Secondary | ICD-10-CM | POA: Insufficient documentation

## 2018-02-05 DIAGNOSIS — E119 Type 2 diabetes mellitus without complications: Secondary | ICD-10-CM

## 2018-02-05 DIAGNOSIS — C787 Secondary malignant neoplasm of liver and intrahepatic bile duct: Secondary | ICD-10-CM | POA: Insufficient documentation

## 2018-02-05 DIAGNOSIS — Z7901 Long term (current) use of anticoagulants: Secondary | ICD-10-CM | POA: Insufficient documentation

## 2018-02-05 NOTE — Telephone Encounter (Signed)
Printed avs and calender of upcoming appointment. Per 1/8 los 

## 2018-02-06 ENCOUNTER — Ambulatory Visit
Admission: RE | Admit: 2018-02-06 | Discharge: 2018-02-06 | Disposition: A | Discharge status: Home / Self Care | Payer: 59 | Source: Ambulatory Visit | Attending: Radiation Oncology | Admitting: Radiation Oncology

## 2018-02-06 ENCOUNTER — Other Ambulatory Visit: Payer: Self-pay | Admitting: Hematology

## 2018-02-06 ENCOUNTER — Telehealth: Payer: Self-pay

## 2018-02-06 DIAGNOSIS — C50412 Malignant neoplasm of upper-outer quadrant of left female breast: Secondary | ICD-10-CM

## 2018-02-06 DIAGNOSIS — Z171 Estrogen receptor negative status [ER-]: Principal | ICD-10-CM

## 2018-02-06 DIAGNOSIS — Z5112 Encounter for antineoplastic immunotherapy: Secondary | ICD-10-CM | POA: Diagnosis not present

## 2018-02-06 NOTE — Telephone Encounter (Signed)
Spoke with patient per [name] to let her know we will wait 1 to 2 months before we get her port placed due to her recent stroke and being on Eliquis.  She is fine with that.  Also she agrees with getting the genetic referral especially if it could given her more treatment options.  Made [name] aware.

## 2018-02-07 ENCOUNTER — Telehealth: Payer: Self-pay | Admitting: Hematology

## 2018-02-07 ENCOUNTER — Ambulatory Visit
Admission: RE | Admit: 2018-02-07 | Discharge: 2018-02-07 | Disposition: A | Discharge status: Home / Self Care | Payer: 59 | Source: Ambulatory Visit | Attending: Radiation Oncology | Admitting: Radiation Oncology

## 2018-02-07 DIAGNOSIS — Z5112 Encounter for antineoplastic immunotherapy: Secondary | ICD-10-CM | POA: Diagnosis not present

## 2018-02-07 NOTE — Telephone Encounter (Signed)
Scheduled appt per 1/9 sch message - pt is aware of appt date and time   

## 2018-02-10 ENCOUNTER — Ambulatory Visit
Admission: RE | Admit: 2018-02-10 | Discharge: 2018-02-10 | Disposition: A | Discharge status: Home / Self Care | Payer: 59 | Source: Ambulatory Visit | Attending: Radiation Oncology | Admitting: Radiation Oncology

## 2018-02-10 DIAGNOSIS — Z5112 Encounter for antineoplastic immunotherapy: Secondary | ICD-10-CM | POA: Diagnosis not present

## 2018-02-11 ENCOUNTER — Ambulatory Visit
Admission: RE | Admit: 2018-02-11 | Discharge: 2018-02-11 | Disposition: A | Discharge status: Home / Self Care | Payer: 59 | Source: Ambulatory Visit | Attending: Radiation Oncology | Admitting: Radiation Oncology

## 2018-02-11 DIAGNOSIS — Z5112 Encounter for antineoplastic immunotherapy: Secondary | ICD-10-CM | POA: Diagnosis not present

## 2018-02-12 ENCOUNTER — Ambulatory Visit: Payer: 59

## 2018-02-12 ENCOUNTER — Ambulatory Visit
Admission: RE | Admit: 2018-02-12 | Discharge: 2018-02-12 | Disposition: A | Discharge status: Home / Self Care | Payer: 59 | Source: Ambulatory Visit | Attending: Radiation Oncology | Admitting: Radiation Oncology

## 2018-02-12 DIAGNOSIS — I639 Cerebral infarction, unspecified: Secondary | ICD-10-CM | POA: Diagnosis not present

## 2018-02-12 DIAGNOSIS — R42 Dizziness and giddiness: Secondary | ICD-10-CM | POA: Diagnosis not present

## 2018-02-12 DIAGNOSIS — C50412 Malignant neoplasm of upper-outer quadrant of left female breast: Secondary | ICD-10-CM | POA: Diagnosis not present

## 2018-02-12 DIAGNOSIS — C7931 Secondary malignant neoplasm of brain: Secondary | ICD-10-CM | POA: Diagnosis not present

## 2018-02-12 NOTE — Progress Notes (Signed)
[place]   Telephone:([phone]) [phone] Fax:(336) [phone]   Clinic Follow up Note   Patient Care Team: Orpah Melter, MD as PCP - General (Family Medicine) [name], MD as Consulting Physician (General Surgery) Truitt Merle, MD as Consulting Physician (Hematology) Gery Pray, MD as Consulting Physician (Radiation Oncology) Ladene Artist, MD as Consulting Physician (Gastroenterology) Gardenia Phlegm, NP as Nurse Practitioner (Hematology and Oncology) 02/18/2018  CHIEF COMPLAINT: F/u on left breast cancer   SUMMARY OF ONCOLOGIC HISTORY: Oncology History   Cancer Staging Breast cancer of upper-outer quadrant of left female breast [facility]) Staging form: Breast, AJCC 8th Edition - Clinical stage from 04/25/2016: Stage IIA (cT2, cN0, cM0, G3, ER: Negative, PR: Negative, HER2: Positive) - Signed by Truitt Merle, MD on 05/02/2016 - Pathologic stage from 10/09/2016: No Stage Recommended (ypT2, pN1a, cM0, G3, ER+, PR-, HER2+) - Signed by Truitt Merle, MD on 07/11/2017       Breast cancer of upper-outer quadrant of left female breast ([place])   04/24/2016 Mammogram    Category B breasts with a new irregular mass in the UOQ left breast middle depth. Ultrasound revealed a 3.9 cm mass in the 1:00 position. The left axilla was negative.     04/25/2016 Initial Biopsy    Biopsy of the left breast showed grade 3 invasive ductal carcinoma, DCIS, and lymphovascular invasion was present.    04/25/2016 Receptors her2    ER 0% negative, PR 0% negative, HER2 positive, Ki67 30%    05/02/2016 Initial Diagnosis    Breast cancer of upper-outer quadrant of left female breast ([place])    05/07/2016 Imaging    MRI of the bilateral breast 05/07/16 IMPRESSION: Lobulated enhancing mass (4.0 x 2.7 x 3.1 cm) in the upper-outer quadrant of the left breast corresponding with the recently diagnosed invasive mammary carcinoma. Linear enhancement extends 2.6 cm posterior to the mass worrisome for ductal  carcinoma in-situ.    05/09/2016 Echocardiogram    Echo 05/09/16 -LF EF: 55-60%    05/11/2016 - 08/31/2016 Neo-Adjuvant Chemotherapy    Cycle 1 Docetaxel, Carboplatin, Herceptin and pejeta (TCHP), cycle 2 Docetaxel changed to Taxol, carbo dose reduced to AUC 4.5 (from 5) due to severe diarrhea and cytopenia, Perjeta held after cycle 2.   Due to poor toleration, [name] was held after cycle 4 and Taxol changed to weekly X4. Herceptin was continued during the above chemo.      05/13/2016 Imaging    CT Angio Chest PE IMPRESSION: No evidence of pulmonary emboli. Left breast mass consistent with the given clinical history. Stable left adrenal lesion likely representing a small adenoma.    05/18/2016 - 05/24/2016 Hospital Admission    Patient presented with nausea, vomiting, and diarrhea; admitted to hospital with Hyponatremia    06/07/2016 - 06/09/2016 Hospital Admission    Patient presents to hospital complaints of rectal bleeding and abdominal cramps when defacating    06/07/2016 Imaging    CT ABDOMEN PELVIS W CONTRAST  IMPRESSION: 1. Wall thickening of the descending and sigmoid colon consistent with an infectious or inflammatory colitis. 2. There is also a short segment of narrowing and possible wall thickening of the superior ascending colon which could reflect a constricting mass or, more likely, be an area of persistent colonic spasm. This could be further assessed with either colonoscopy or a barium enema after the current symptoms of colitis have resolved. 3. Small, subcentimeter, low-density liver lesions which may all be benign. However, 3 these are not evident on prior  CT. Liver metastatic disease possible. These could be further assessed with liver MRI with and without contrast. 4. Adrenal lesions which are stable, on the right and myelolipoma and on the left most likely an adenoma.    06/07/2016 Imaging    CT A/P IMPRESSION: 1. Wall thickening of the descending and sigmoid  colon consistent with an infectious or inflammatory colitis. 2. There is also a short segment of narrowing and possible wall thickening of the superior ascending colon which could reflect a constricting mass or, more likely, be an area of persistent colonic spasm. This could be further assessed with either colonoscopy or a barium enema after the current symptoms of colitis have resolved. 3. Small, subcentimeter, low-density liver lesions which may all be benign. However, 3 these are not evident on prior CT. Liver metastatic disease possible. These could be further assessed with liver MRI with and without contrast. 4. Adrenal lesions which are stable, on the right and myelolipoma and on the left most likely an adenoma.    06/07/2016 - 06/09/2016 Hospital Admission    Diarrhea and rectal Bleeding    07/19/2016 - 07/20/2016 Hospital Admission    Diarrhea and dehydration    08/09/2016 Mammogram    Mammogram 08/09/16 IMPRESSION:  The 3.1 cm x 2 cm x 1.5 cm irregular equal density mass in the left breast is consistent with the known carcinoma showing mammographic evidence of preoperative chemotherapy response.       08/09/2016 Imaging    [place] of left breast 08/09/16 IMPRESSION:  The 2.7 cm lobulated mass in the left breast is a known biopsy positive for malignancy. Surgical and oncology consult in progress.    08/16/2016 Imaging    MRI Abdomen W WO Contrast IMPRESSION: Tiny sub-cm hepatic cysts. No evidence of metastatic disease or other acute findings. Tiny benign left adrenal adenoma and right adrenal myelolipoma.    09/14/2016 - 05/10/2017 Chemotherapy    Maintenance Herceptin every 3 weeks. Added Perjeta back on 10/26/16. Completed on 05/10/17    09/17/2016 Imaging    MRI Breast Bilateral 09/17/16 IMPRESSION: Smaller left breast mass, now measuring 2.5 cm. No new or suspicious enhancement in either breast.  RECOMMENDATION: Treatment plan.    10/09/2016 Surgery    LEFT BREAST  LUMPECTOMY WITH RADIOACTIVE SEED AND L4EFT AXILLARY SENTINEL LYMPH NODE BIOPSY by Dr. Lucia Gaskins on 10/09/16    10/09/2016 Pathology Results    Diagnosis 10/09/16 1. Breast, lumpectomy, Left - INVASIVE DUCTAL CARCINOMA, GRADE 3, SPANNING 2.4 CM. - HIGH GRADE DUCTAL CARCINOMA IN SITU WITH NECROSIS. - RESECTION MARGINS ARE NEGATIVE FOR CARCINOMA. - BIOPSY SITE. - SEE ONCOLOGY TABLE. 2. Breast, excision, Left additional medial margin - FIBROCYSTIC CHANGE. - NO MALIGNANCY IDENTIFIED. 3. Lymph node, sentinel, biopsy, Left Axillary - METASTATIC CARCINOMA IN ONE OF ONE LYMPH NODES (1/1).      11/22/2016 - 01/10/2017 Radiation Therapy    Radiation with Dr. Sondra Come and complete on 01/10/17    11/22/2016 - 05/15/2017 Chemotherapy    Xeloda With radiation she will be on low dose 4 tablets in the am and 3 tablets in the pm. Starting 11/22/16 and complete on 01/10/17.   Will continue Xeloda, 14 days on and 7 days off, 4 tablets BID starting 01/18/17 and completed on 05/15/17.  -Due to severe diarrhea, her Xeloda dose was decreased from 2000 mg twice daily to 2043m in am and 15090min pm on 02/11/17.      03/12/2017 Echocardiogram    ECHO 03/12/17 Impressions: - Normal LV  size with EF 55-60%. Strain as above. Normal RV size   and systolic function. No significant valvular abnormalities.    04/18/2017 Mammogram    IMPRESSION:  New post treatment changes of the left breast. There is no mammographic evidence of malignancy. A follow-up mammogram in 12 months is recommended.     06/10/2017 Echocardiogram    ECHO 06/10/2017 LV EF: 55% -   60%    06/2017 - 12/2017 Anti-estrogen oral therapy    Anastrozole 77m daily, starting 06/2017. Stopped in 12/2017 due to cancer recurrence    07/20/2017 Imaging    DEXA Scan WNLs    07/20/2017 Imaging    DEXA Scan with T-score of 0.4    12/2017 Relapse/Recurrence    During hospitalization for stroke, her workup showed evidence of diffuse metastatic disease from  previous breast cancer. Cervical LN Biopsy confimed this.     01/27/2018 Imaging    CT Angio Head WO Contrast 01/27/18  IMPRESSION: 1. Malignant-appearing adenopathy in the chest and left lower neck presumably related patient's history of breast cancer. Bilateral upper lobe subpleural nodularity suggesting lymphatic spread of tumor. Moderate right pleural effusion. 2. No significant vascular finding.    01/28/2018 Imaging    MRI BRain W Contrast 01/28/18  IMPRESSION: 1. At least 14 focal enhancing lesions are present in the cerebrum and cerebellum. The largest lesion is in the anterior right frontal lobe measuring 5.5 mm. These are separate from the previously seen foci of restricted diffusion and likely represent diffuse metastases. 2. No enhancement associated with the areas of restricted diffusion previously seen. These are likely separate areas of embolic infarcts.    01/28/2018 Imaging    CT CAP W Contast 01/28/18  IMPRESSION: 1. Interval development of metastatic disease including mediastinal and right hilar adenopathy, bilateral pulmonary nodules, right pleural effusion with suggestion of peritoneal nodularity, hepatic metastatic disease and splenic metastatic disease. 2. Re demonstrated wall thickening of the sigmoid colon and rectum which may be secondary to an inflammatory process.    01/30/2018 Pathology Results    Thoracentesis Cytology 01/30/18 Diagnosis PLEURAL FLUID, RIGHT (SPECIMEN 1 OF 1 COLLECTED 01/30/18): MALIGNANT CELLS CONSISTENT WITH METASTATIC ADENOCARCINOMA.    01/30/2018 -  Radiation Therapy    RT with Dr. MTammi Klippelon 01/30/18 and plans to complete on 02/18/18    01/31/2018 Pathology Results    Diagnosis 01/31/18 Lymph node, needle/core biopsy, cervical - METASTATIC CARCINOMA CONSISTENT WITH BREAST PRIMARY. Microscopic Comment The carcinoma with immunohistochemistry is positive with GATA-3 and negative with ER, PR and GCDFP. The morphology and immunophenotype  are consistent with metastatic breast carcinoma.    02/21/2018 -  Chemotherapy    The patient had ado-trastuzumab emtansine (KADCYLA) 260 mg in sodium chloride 0.9 % 250 mL chemo infusion, 3.6 mg/kg = 260 mg, Intravenous, Once, 0 of 5 cycles  for chemotherapy treatment.      CURRENT THERAPY WBRT with Dr. MTammi Klippelon 01/30/18 and plans to complete on 02/18/18  PENDING Kadcyla every 3 weeks starting 02/21/18 Anastrozole since 06/2017   INTERVAL HISTORY: RJOWANDA HEEGis a 58y.o. female who is here for follow-up. Today, she is here with her husband. She is on a wheelchair. She recently had an episode of difficulty speeching while having dinner with friends. She was awake and aware at the time of the event and was able to understand her friends, but was not able to form meaningful sentences. Her strength is good. Her symptoms happened around her Lovenox shot time. She skipped  her radiation appointment yesterday.   Pertinent positives and negatives of review of systems are listed and detailed within the above HPI.  REVIEW OF SYSTEMS:   Constitutional: Denies fevers, chills or abnormal weight loss (+) recent episode of dysphasia  Eyes: Denies blurriness of vision Ears, nose, mouth, throat, and face: Denies mucositis or sore throat Respiratory: Denies cough, dyspnea or wheezes Cardiovascular: Denies palpitation, chest discomfort or lower extremity swelling Gastrointestinal:  Denies nausea, heartburn or change in bowel habits Skin: Denies abnormal skin rashes Lymphatics: Denies new lymphadenopathy or easy bruising Neurological:Denies numbness, tingling or new weaknesses Behavioral/Psych: Mood is stable, no new changes  All other systems were reviewed with the patient and are negative.  MEDICAL HISTORY:  Past Medical History:  Diagnosis Date  . Arthritis   . Asthma    triggered with [name] perfumes and cigarette smoke  . Cancer ([place])   . Colitis   . Diabetes mellitus without  complication ([place])   . History of radiation therapy 11/22/16-01/10/17   left breast 50.4 Gy in 28 fractions, axillary region 45 Gy in 25 fractions, lumpectomy cavity boost 10 Gy tin 5 fractions  . Hypertension   . Neuropathy     SURGICAL HISTORY: Past Surgical History:  Procedure Laterality Date  . ABDOMINAL HYSTERECTOMY    . BREAST LUMPECTOMY WITH RADIOACTIVE SEED AND SENTINEL LYMPH NODE BIOPSY Left 10/09/2016   Procedure: LEFT BREAST LUMPECTOMY WITH RADIOACTIVE SEED AND L4EFT AXILLARY SENTINEL LYMPH NODE BIOPSY;  Surgeon: [name], MD;  Location: [facility];  Service: General;  Laterality: Left;  . DILATION AND CURETTAGE OF UTERUS    . KNEE ARTHROSCOPY Left   . PORTACATH PLACEMENT Right 05/08/2016   Procedure: INSERTION PORT-A-CATH WITH [place];  Surgeon: [name], MD;  Location: [facility];  Service: General;  Laterality: Right;  . TONSILLECTOMY    . TOTAL HIP ARTHROPLASTY Right   . TOTAL HIP ARTHROPLASTY Left 03/06/2016   Procedure: LEFT TOTAL HIP ARTHROPLASTY ANTERIOR APPROACH;  Surgeon: [name], MD;  Location: [facility];  Service: Orthopedics;  Laterality: Left;    I have reviewed the social history and family history with the patient and they are unchanged from previous note.  ALLERGIES:  is allergic to gadavist [gadobutrol]; lisinopril; augmentin [amoxicillin-pot clavulanate]; benadryl [diphenhydramine]; and losartan potassium.  MEDICATIONS:  Current Outpatient Medications  Medication Sig Dispense Refill  . albuterol (PROVENTIL HFA;VENTOLIN HFA) 108 (90 Base) MCG/ACT inhaler Inhale 1-2 puffs into the lungs every 6 (six) hours as needed for wheezing or shortness of breath.    . anastrozole (ARIMIDEX) 1 MG tablet Take 1 tablet (1 mg total) by mouth daily. 30 tablet 5  . aspirin EC 81 MG tablet Take 81 mg by mouth 2 (two) times daily.    [name] Kitchen atorvastatin (LIPITOR) 20 MG tablet Take 1 tablet (20 mg total) by mouth daily. 30 tablet 1  . dexamethasone  (DECADRON) 4 MG tablet Take 1 tablet (4 mg total) by mouth 2 (two) times daily with a meal. 40 tablet 2  . dicyclomine (BENTYL) 20 MG tablet Take 1 tablet (20 mg total) by mouth 4 (four) times daily -  before meals and at bedtime. (Patient taking differently: Take 20 mg by mouth 3 (three) times daily as needed for spasms. ) 30 tablet 0  . enoxaparin (LOVENOX) 80 MG/0.8ML injection Inject 0.8 mLs (80 mg total) into the skin every 12 (twelve) hours for 30 days. 48 mL 0  . gabapentin (NEURONTIN) 100 MG capsule  Take 200 mg by mouth at bedtime.     . Garlic (GARLIQUE) 903 MG TBEC Take 400 mg by mouth daily.    . Ginkgo Biloba Extract 120 MG CAPS Take 120 mg by mouth daily with breakfast.    . levETIRAcetam (KEPPRA) 500 MG tablet Take 1 tablet (500 mg total) by mouth 2 (two) times daily for 30 days. 60 tablet 0  . loperamide (IMODIUM) 2 MG capsule Take 4 mg by mouth as needed for diarrhea or loose stools.    . Mesalamine 800 MG TBEC 2 TABLETS BY MOUTH THREE TIMES A DAY (Patient taking differently: Take 2 tablets by mouth 2 (two) times daily. ) 180 tablet 2  . metFORMIN (GLUCOPHAGE-XR) 500 MG 24 hr tablet Take 1,000 mg by mouth at bedtime.    . metoprolol tartrate (LOPRESSOR) 25 MG tablet Take 1 tablet (25 mg total) by mouth 2 (two) times daily. 60 tablet 11  . potassium chloride SA (KLOR-CON M20) 20 MEQ tablet Take 1 tablet (20 mEq total) by mouth 2 (two) times daily. (Patient taking differently: Take 20 mEq by mouth daily with breakfast. ) 60 tablet 2   No current facility-administered medications for this visit.    Facility-Administered Medications Ordered in Other Visits  Medication Dose Route Frequency Provider Last Rate Last Dose  . heparin lock flush 100 unit/mL  500 Units Intracatheter Once PRN Truitt Merle, MD      . sodium chloride flush (NS) 0.9 % injection 10 mL  10 mL Intracatheter PRN Truitt Merle, MD        PHYSICAL EXAMINATION: ECOG PERFORMANCE STATUS: 3 - Symptomatic, >50% confined to  bed  Vitals:   02/18/18 1437  BP: 93/79  Pulse: 80  Resp: 17  Temp: 98.5 F (36.9 C)  SpO2: 99%   Filed Weights   02/18/18 1437  Weight: 173 lb 3.2 oz (78.6 kg)    GENERAL:alert, no distress and comfortable (+) on wheelchair  SKIN: skin color, texture, turgor are normal, no rashes or significant lesions EYES: normal, Conjunctiva are pink and non-injected, sclera clear OROPHARYNX:no exudate, no erythema and lips, buccal mucosa, and tongue normal  NECK: supple, thyroid normal size, non-tender, without nodularity LYMPH:  no palpable lymphadenopathy in the cervical, axillary or inguinal LUNGS: clear to auscultation and percussion with normal breathing effort HEART: regular rate & rhythm and no murmurs and no lower extremity edema ABDOMEN:abdomen soft, non-tender and normal bowel sounds Musculoskeletal:no cyanosis of digits and no clubbing  NEURO: alert & oriented x 3 with fluent speech, no focal motor/sensory deficits  LABORATORY DATA:  I have reviewed the data as listed CBC Latest Ref Rng & Units 02/18/2018 02/17/2018 02/17/2018  WBC 4.0 - 10.5 K/uL 7.7 - 7.1  Hemoglobin 12.0 - 15.0 g/dL 13.4 13.6 12.2  Hematocrit 36.0 - 46.0 % 42.4 40.0 40.3  Platelets 150 - 400 K/uL 270 - 233     CMP Latest Ref Rng & Units 02/18/2018 02/17/2018 02/17/2018  Glucose 70 - 99 mg/dL 105(H) 93 99  BUN 6 - 20 mg/dL 13 15 12   Creatinine 0.44 - 1.00 mg/dL 0.86 0.80 0.88  Sodium 135 - 145 mmol/L 138 140 141  Potassium 3.5 - 5.1 mmol/L 3.9 3.3(L) 3.4(L)  Chloride 98 - 111 mmol/L 103 104 105  CO2 22 - 32 mmol/L 25 - 25  Calcium 8.9 - 10.3 mg/dL 9.4 - 8.8(L)  Total Protein 6.5 - 8.1 g/dL 7.1 - 6.3(L)  Total Bilirubin 0.3 - 1.2 mg/dL 0.6 - 0.8  Alkaline Phos 38 - 126 U/L 123 - 95  AST 15 - 41 U/L 28 - 28  ALT 0 - 44 U/L 24 - 22      RADIOGRAPHIC STUDIES: I have personally reviewed the radiological images as listed and agreed with the findings in the report. Ct Head Wo Contrast  Result Date:  02/17/2018 CLINICAL DATA:  Right-sided weakness.  Metastatic breast carcinoma EXAM: CT HEAD WITHOUT CONTRAST TECHNIQUE: Contiguous axial images were obtained from the base of the skull through the vertex without intravenous contrast. COMPARISON:  Head CT February 14, 2018 and brain MR February 15, 2018. Breast carcinoma FINDINGS: Brain: The ventricles are normal in size and configuration. No well-defined masses seen on noncontrast enhanced study. The multiple small masses seen on recent MR are not appreciated on this study. There is no hemorrhage, extra-axial fluid collection, or midline shift. Decreased attenuation in the posterior right parietal lobe is stable, likely representing recent infarct. No new parenchymal lesion is appreciable on noncontrast enhanced study. Vascular: No hyperdense vessel. There is no appreciable vascular calcification. Skull: The bony calvarium appears intact. No blastic or lytic bone lesions are evident. Sinuses/Orbits: There are retention cysts in the inferior left maxillary antrum. There is mucosal thickening in several ethmoid air cells as well as in the left maxillary antrum. Orbits appear symmetric bilaterally. Other: Mastoids on the left are clear. There is opacification in several inferior right-sided mastoid air cells. IMPRESSION: 1. Recent infarct in the right posterior parietal region white matter is again noted without change by noncontrast enhanced CT. Small enhancing mass lesions seen on recent MR are not appreciable by CT. No new foci of edema or apparent acute infarct beyond the lesion in the posterior right parietal region noted on noncontrast enhanced head CT examination. No hemorrhage. 2. There are foci of paranasal sinus disease. There is mild inferior right-sided mastoid air cell disease, stable. Electronically Signed   By: Lowella Grip III M.D.   On: 02/17/2018 12:56     ASSESSMENT & PLAN:  Brandi Dickson is a 58 y.o. female with history of  1. Breast  cancer of upper-outer quadrant of left breast, invasive ductal carcinoma, stage IIA (cT2N0M0) grade 3, ER-, PR-, HER2 amplified, ypT2N1a, ER30% weakly+, PR-, HER2+, Metastatic to brain, liver, pleura and nodes in 12/2017, ER-/PR- -left breast cancer initially diagnosed in 03/2016. Treated with neoadjuvant TCHP with herceptin and Perjeta, left breast lumpectomy, adjuvant chemoRT and Xeloda. She is currently on Anastrozole.  -unfortunately she developed diffuse brain mets, and also mets to liver, lungs, pleura, and node metastasis, biopsy confirmed  -she is finishing brain radiation tomorrow   -Plan to start Kadcyla this Friday, chemo consent obtained on last visit  -She recently experienced an episode of dysphasia, and another episode of right arm weakness, repeated brain MRI showed new acute ischemic stroke, her anticoagulation was changed to Lovenox 80 mg twice daily after the first episode, and second episode happened after she missed 1 dose of Lovenox. -We discussed her that that she is at extremely high risk for thrombosis, especially stroke, and the importance of compliance with anticoagulation of Lovenox, she agrees. I refilled her lovenox today  -she knows to call for any concerns  -she will start Kadcyla this Friday.  -The HER2 test on her biopsy is still pending, I sent a message to path today  -f/u next Friday  -I will also request FO genomic testing on her biopsy   2. Type 2 Diabetes mellitus, HTN -f/u with  PCP  3. Ulcerative colitis and chronic diarrhea -Currently on Mesalamine. Continue and f/u with Dr. Fuller Plan. -Plan to repeat colonoscopy this year -Diarrhea is controlled with imodium. Continue.  4. Arthritis -She knows that Anastrozole can contribute to her arthritis. -f/u with PCP and monitor  5. Peripheral Neuropathy, secondary toDM and worsened withchemotherapy - Currently on Gabapentin 200 mg bedtime and Vitamin B12. Continue. Will adjust Gabapentin dose as needed.  6.  Bone Health -Last DEXA in 2019 was normal  7. Bilateral multifocal stroke, embolic pattern, recurrent  -Residual blurred and double vision. -She was initially put on Eliquis twice daily, due to her recurrent stroke, it was switched to Lovenox 80 mg twice daily, will continue   8. Right Pleural Effusion  -Last thoracocentesis was in 01/2017. Will likely reoccur due to pleural metastasis. Will monitor.  9.Goal of care discussion  -We again discussed the incurable nature of her cancer, and the overall poor prognosis, especially if she does not have good response to chemotherapy or progress on chemo -The patient understands the goal of care is palliative. -I recommend DNR/DNI, she will think about it   Plan  -I refilled Lovenox, continue 57m q12h  -she will proceed first cycle Kaycyla this Friday  -I will reschedule her genetics appointment -f/u in 1 week  No problem-specific Assessment & Plan notes found for this encounter.   No orders of the defined types were placed in this encounter.  All questions were answered. The patient knows to call the clinic with any problems, questions or concerns. No barriers to learning was detected. I spent 20 minutes counseling the patient face to face. The total time spent in the appointment was 25 minutes and more than 50% was on counseling and review of test results  I, Noor Dweik am acting as scribe for Dr. YTruitt Merle  I have reviewed the above documentation for accuracy and completeness, and I agree with the above.     YTruitt Merle MD 02/18/2018

## 2018-02-13 ENCOUNTER — Other Ambulatory Visit: Payer: Self-pay | Admitting: Hematology

## 2018-02-13 ENCOUNTER — Ambulatory Visit
Admission: RE | Admit: 2018-02-13 | Discharge: 2018-02-13 | Disposition: A | Discharge status: Home / Self Care | Payer: 59 | Source: Ambulatory Visit | Attending: Radiation Oncology | Admitting: Radiation Oncology

## 2018-02-14 ENCOUNTER — Other Ambulatory Visit: Payer: Self-pay

## 2018-02-14 ENCOUNTER — Inpatient Hospital Stay (HOSPITAL_COMMUNITY)
Admission: EM | Admit: 2018-02-14 | Discharge: 2018-02-16 | DRG: 065 | Disposition: A | Discharge status: Home / Self Care | Payer: 59 | Attending: Family Medicine | Admitting: Family Medicine

## 2018-02-14 ENCOUNTER — Emergency Department (HOSPITAL_COMMUNITY): Payer: 59

## 2018-02-14 ENCOUNTER — Encounter (HOSPITAL_COMMUNITY): Payer: Self-pay | Admitting: Emergency Medicine

## 2018-02-14 ENCOUNTER — Ambulatory Visit
Admission: RE | Admit: 2018-02-14 | Discharge: 2018-02-14 | Disposition: A | Discharge status: Home / Self Care | Payer: 59 | Source: Ambulatory Visit | Attending: Radiation Oncology | Admitting: Radiation Oncology

## 2018-02-14 ENCOUNTER — Other Ambulatory Visit: Payer: Self-pay | Admitting: Hematology

## 2018-02-14 DIAGNOSIS — Z90710 Acquired absence of both cervix and uterus: Secondary | ICD-10-CM | POA: Diagnosis not present

## 2018-02-14 DIAGNOSIS — C7800 Secondary malignant neoplasm of unspecified lung: Secondary | ICD-10-CM | POA: Diagnosis present

## 2018-02-14 DIAGNOSIS — C787 Secondary malignant neoplasm of liver and intrahepatic bile duct: Secondary | ICD-10-CM | POA: Diagnosis not present

## 2018-02-14 DIAGNOSIS — G629 Polyneuropathy, unspecified: Secondary | ICD-10-CM

## 2018-02-14 DIAGNOSIS — R2990 Unspecified symptoms and signs involving the nervous system: Secondary | ICD-10-CM

## 2018-02-14 DIAGNOSIS — C50412 Malignant neoplasm of upper-outer quadrant of left female breast: Secondary | ICD-10-CM | POA: Diagnosis present

## 2018-02-14 DIAGNOSIS — Z923 Personal history of irradiation: Secondary | ICD-10-CM

## 2018-02-14 DIAGNOSIS — R451 Restlessness and agitation: Secondary | ICD-10-CM | POA: Diagnosis not present

## 2018-02-14 DIAGNOSIS — Z803 Family history of malignant neoplasm of breast: Secondary | ICD-10-CM | POA: Diagnosis not present

## 2018-02-14 DIAGNOSIS — R29702 NIHSS score 2: Secondary | ICD-10-CM | POA: Diagnosis not present

## 2018-02-14 DIAGNOSIS — J45909 Unspecified asthma, uncomplicated: Secondary | ICD-10-CM | POA: Diagnosis present

## 2018-02-14 DIAGNOSIS — Z7982 Long term (current) use of aspirin: Secondary | ICD-10-CM | POA: Diagnosis not present

## 2018-02-14 DIAGNOSIS — R270 Ataxia, unspecified: Secondary | ICD-10-CM | POA: Diagnosis not present

## 2018-02-14 DIAGNOSIS — C7931 Secondary malignant neoplasm of brain: Secondary | ICD-10-CM | POA: Diagnosis not present

## 2018-02-14 DIAGNOSIS — J90 Pleural effusion, not elsewhere classified: Secondary | ICD-10-CM | POA: Diagnosis not present

## 2018-02-14 DIAGNOSIS — C50919 Malignant neoplasm of unspecified site of unspecified female breast: Secondary | ICD-10-CM | POA: Diagnosis not present

## 2018-02-14 DIAGNOSIS — Z87891 Personal history of nicotine dependence: Secondary | ICD-10-CM

## 2018-02-14 DIAGNOSIS — E119 Type 2 diabetes mellitus without complications: Secondary | ICD-10-CM

## 2018-02-14 DIAGNOSIS — Z79899 Other long term (current) drug therapy: Secondary | ICD-10-CM

## 2018-02-14 DIAGNOSIS — Z7901 Long term (current) use of anticoagulants: Secondary | ICD-10-CM | POA: Diagnosis not present

## 2018-02-14 DIAGNOSIS — E663 Overweight: Secondary | ICD-10-CM | POA: Diagnosis not present

## 2018-02-14 DIAGNOSIS — I639 Cerebral infarction, unspecified: Secondary | ICD-10-CM | POA: Diagnosis present

## 2018-02-14 DIAGNOSIS — G459 Transient cerebral ischemic attack, unspecified: Secondary | ICD-10-CM | POA: Diagnosis present

## 2018-02-14 DIAGNOSIS — K5190 Ulcerative colitis, unspecified, without complications: Secondary | ICD-10-CM | POA: Diagnosis present

## 2018-02-14 DIAGNOSIS — E785 Hyperlipidemia, unspecified: Secondary | ICD-10-CM | POA: Diagnosis not present

## 2018-02-14 DIAGNOSIS — I10 Essential (primary) hypertension: Secondary | ICD-10-CM | POA: Diagnosis not present

## 2018-02-14 DIAGNOSIS — F802 Mixed receptive-expressive language disorder: Secondary | ICD-10-CM | POA: Diagnosis not present

## 2018-02-14 DIAGNOSIS — Z96643 Presence of artificial hip joint, bilateral: Secondary | ICD-10-CM | POA: Diagnosis not present

## 2018-02-14 DIAGNOSIS — Z888 Allergy status to other drugs, medicaments and biological substances status: Secondary | ICD-10-CM

## 2018-02-14 DIAGNOSIS — R42 Dizziness and giddiness: Secondary | ICD-10-CM | POA: Diagnosis not present

## 2018-02-14 DIAGNOSIS — Z808 Family history of malignant neoplasm of other organs or systems: Secondary | ICD-10-CM | POA: Diagnosis not present

## 2018-02-14 DIAGNOSIS — Z6828 Body mass index (BMI) 28.0-28.9, adult: Secondary | ICD-10-CM

## 2018-02-14 DIAGNOSIS — Z8673 Personal history of transient ischemic attack (TIA), and cerebral infarction without residual deficits: Secondary | ICD-10-CM | POA: Diagnosis not present

## 2018-02-14 DIAGNOSIS — Z881 Allergy status to other antibiotic agents status: Secondary | ICD-10-CM

## 2018-02-14 DIAGNOSIS — Z7984 Long term (current) use of oral hypoglycemic drugs: Secondary | ICD-10-CM | POA: Diagnosis not present

## 2018-02-14 DIAGNOSIS — E1140 Type 2 diabetes mellitus with diabetic neuropathy, unspecified: Secondary | ICD-10-CM | POA: Diagnosis not present

## 2018-02-14 DIAGNOSIS — Z171 Estrogen receptor negative status [ER-]: Secondary | ICD-10-CM

## 2018-02-14 DIAGNOSIS — K5130 Ulcerative (chronic) rectosigmoiditis without complications: Secondary | ICD-10-CM

## 2018-02-14 LAB — DIFFERENTIAL
Abs Immature Granulocytes: 0.07 10*3/uL (ref 0.00–0.07)
BASOS PCT: 0 %
Basophils Absolute: 0 10*3/uL (ref 0.0–0.1)
Eosinophils Absolute: 0.1 10*3/uL (ref 0.0–0.5)
Eosinophils Relative: 1 %
Immature Granulocytes: 1 %
Lymphocytes Relative: 14 %
Lymphs Abs: 1.5 10*3/uL (ref 0.7–4.0)
Monocytes Absolute: 1.2 10*3/uL — ABNORMAL HIGH (ref 0.1–1.0)
Monocytes Relative: 11 %
NEUTROS ABS: 8 10*3/uL — AB (ref 1.7–7.7)
Neutrophils Relative %: 73 %

## 2018-02-14 LAB — URINALYSIS, ROUTINE W REFLEX MICROSCOPIC
Bilirubin Urine: NEGATIVE
Glucose, UA: NEGATIVE mg/dL
Hgb urine dipstick: NEGATIVE
Ketones, ur: NEGATIVE mg/dL
Leukocytes, UA: NEGATIVE
Nitrite: NEGATIVE
Protein, ur: NEGATIVE mg/dL
Specific Gravity, Urine: >1.046 — ABNORMAL HIGH (ref 1.005–1.030)
pH: 6 (ref 5.0–8.0)

## 2018-02-14 LAB — COMPREHENSIVE METABOLIC PANEL
ALK PHOS: 100 U/L (ref 38–126)
ALT: 24 U/L (ref 0–44)
AST: 27 U/L (ref 15–41)
Albumin: 3.7 g/dL (ref 3.5–5.0)
Anion gap: 12 (ref 5–15)
BUN: 21 mg/dL — ABNORMAL HIGH (ref 6–20)
CALCIUM: 9 mg/dL (ref 8.9–10.3)
CO2: 24 mmol/L (ref 22–32)
Chloride: 102 mmol/L (ref 98–111)
Creatinine, Ser: 0.94 mg/dL (ref 0.44–1.00)
GFR calc Af Amer: >60 mL/min (ref >60)
GFR calc non Af Amer: >60 mL/min (ref >60)
Glucose, Bld: 120 mg/dL — ABNORMAL HIGH (ref 70–99)
Potassium: 3.7 mmol/L (ref 3.5–5.1)
Sodium: 138 mmol/L (ref 135–145)
Total Bilirubin: 1.1 mg/dL (ref 0.3–1.2)
Total Protein: 7.4 g/dL (ref 6.5–8.1)

## 2018-02-14 LAB — I-STAT TROPONIN, ED: Troponin i, poc: 0.01 ng/mL (ref 0.00–0.08)

## 2018-02-14 LAB — I-STAT CHEM 8, ED
BUN: 19 mg/dL (ref 6–20)
Calcium, Ion: 1.14 mmol/L — ABNORMAL LOW (ref 1.15–1.40)
Chloride: 102 mmol/L (ref 98–111)
Creatinine, Ser: 0.8 mg/dL (ref 0.44–1.00)
Glucose, Bld: 120 mg/dL — ABNORMAL HIGH (ref 70–99)
HCT: 46 % (ref 36.0–46.0)
Hemoglobin: 15.6 g/dL — ABNORMAL HIGH (ref 12.0–15.0)
Potassium: 3.8 mmol/L (ref 3.5–5.1)
Sodium: 137 mmol/L (ref 135–145)
TCO2: 25 mmol/L (ref 22–32)

## 2018-02-14 LAB — APTT: aPTT: 30 seconds (ref 24–36)

## 2018-02-14 LAB — CBC
HCT: 45.1 % (ref 36.0–46.0)
Hemoglobin: 14 g/dL (ref 12.0–15.0)
MCH: 26.2 pg (ref 26.0–34.0)
MCHC: 31 g/dL (ref 30.0–36.0)
MCV: 84.5 fL (ref 80.0–100.0)
Platelets: 311 10*3/uL (ref 150–400)
RBC: 5.34 MIL/uL — ABNORMAL HIGH (ref 3.87–5.11)
RDW: 14.3 % (ref 11.5–15.5)
WBC: 10.8 10*3/uL — AB (ref 4.0–10.5)
nRBC: 0 % (ref 0.0–0.2)

## 2018-02-14 LAB — PROTIME-INR
INR: 0.96
Prothrombin Time: 12.7 seconds (ref 11.4–15.2)

## 2018-02-14 LAB — I-STAT BETA HCG BLOOD, ED (MC, WL, AP ONLY): I-stat hCG, quantitative: 6.7 m[IU]/mL — ABNORMAL HIGH (ref <5)

## 2018-02-14 MED ORDER — ANASTROZOLE 1 MG PO TABS
1.0000 mg | ORAL_TABLET | Freq: Every day | ORAL | Status: DC
  Administered 2018-02-15 – 2018-02-16 (×2): 1 mg via ORAL
  Filled 2018-02-14 (×2): qty 1

## 2018-02-14 MED ORDER — ACETAMINOPHEN 160 MG/5ML PO SOLN: 650.0000 mg | ORAL | Status: DC | PRN

## 2018-02-14 MED ORDER — ACETAMINOPHEN 650 MG RE SUPP: 650.0000 mg | RECTAL | Status: DC | PRN

## 2018-02-14 MED ORDER — IOPAMIDOL (ISOVUE-370) INJECTION 76%
INTRAVENOUS | Status: AC
  Filled 2018-02-14: qty 100

## 2018-02-14 MED ORDER — ASPIRIN EC 81 MG PO TBEC
81.0000 mg | DELAYED_RELEASE_TABLET | Freq: Two times a day (BID) | ORAL | Status: DC
  Administered 2018-02-15 – 2018-02-16 (×4): 81 mg via ORAL
  Filled 2018-02-14 (×4): qty 1

## 2018-02-14 MED ORDER — STROKE: EARLY STAGES OF RECOVERY BOOK
Freq: Once | Status: AC
  Administered 2018-02-15: 01:00:00
  Filled 2018-02-14: qty 1

## 2018-02-14 MED ORDER — ATORVASTATIN CALCIUM 10 MG PO TABS
20.0000 mg | ORAL_TABLET | Freq: Every day | ORAL | Status: DC
  Administered 2018-02-15 – 2018-02-16 (×2): 20 mg via ORAL
  Filled 2018-02-14 (×2): qty 2

## 2018-02-14 MED ORDER — GABAPENTIN 100 MG PO CAPS
200.0000 mg | ORAL_CAPSULE | Freq: Every day | ORAL | Status: DC
  Administered 2018-02-15 (×2): 200 mg via ORAL
  Filled 2018-02-14 (×2): qty 2

## 2018-02-14 MED ORDER — SODIUM CHLORIDE (PF) 0.9 % IJ SOLN
INTRAMUSCULAR | Status: AC
  Filled 2018-02-14: qty 50

## 2018-02-14 MED ORDER — MESALAMINE 400 MG PO CPDR
1600.0000 mg | DELAYED_RELEASE_CAPSULE | Freq: Two times a day (BID) | ORAL | Status: DC
  Administered 2018-02-15 – 2018-02-16 (×3): 1600 mg via ORAL
  Filled 2018-02-14 (×4): qty 4

## 2018-02-14 MED ORDER — LEVETIRACETAM 500 MG PO TABS
500.0000 mg | ORAL_TABLET | Freq: Two times a day (BID) | ORAL | Status: DC
  Administered 2018-02-15 – 2018-02-16 (×3): 500 mg via ORAL
  Filled 2018-02-14 (×3): qty 1

## 2018-02-14 MED ORDER — SODIUM CHLORIDE 0.9 % IV SOLN: 250.0000 mL | INTRAVENOUS | Status: DC | PRN

## 2018-02-14 MED ORDER — IOPAMIDOL (ISOVUE-370) INJECTION 76%
100.0000 mL | Freq: Once | INTRAVENOUS | Status: AC | PRN
  Administered 2018-02-14: 100 mL via INTRAVENOUS

## 2018-02-14 MED ORDER — SODIUM CHLORIDE 0.9 % IV SOLN
INTRAVENOUS | Status: DC
  Administered 2018-02-15 (×2): via INTRAVENOUS

## 2018-02-14 MED ORDER — DEXAMETHASONE 2 MG PO TABS
2.0000 mg | ORAL_TABLET | Freq: Two times a day (BID) | ORAL | Status: DC
  Administered 2018-02-15 – 2018-02-16 (×3): 2 mg via ORAL
  Filled 2018-02-14 (×3): qty 1

## 2018-02-14 MED ORDER — SODIUM CHLORIDE 0.9% FLUSH
3.0000 mL | Freq: Once | INTRAVENOUS | Status: AC
Start: 2018-02-14 — End: 2018-02-15

## 2018-02-14 MED ORDER — LEVETIRACETAM IN NACL 1000 MG/100ML IV SOLN
1000.0000 mg | Freq: Once | INTRAVENOUS | Status: AC
  Administered 2018-02-15: 1000 mg via INTRAVENOUS
  Filled 2018-02-14 (×2): qty 100

## 2018-02-14 MED ORDER — SODIUM CHLORIDE 0.9% FLUSH
3.0000 mL | Freq: Two times a day (BID) | INTRAVENOUS | Status: DC
  Administered 2018-02-15 – 2018-02-16 (×4): 3 mL via INTRAVENOUS

## 2018-02-14 MED ORDER — APIXABAN 5 MG PO TABS
5.0000 mg | ORAL_TABLET | Freq: Two times a day (BID) | ORAL | Status: DC
  Administered 2018-02-15 (×2): 5 mg via ORAL
  Filled 2018-02-14 (×2): qty 1

## 2018-02-14 MED ORDER — ACETAMINOPHEN 325 MG PO TABS
650.0000 mg | ORAL_TABLET | ORAL | Status: DC | PRN
  Administered 2018-02-15: 650 mg via ORAL
  Filled 2018-02-14: qty 2

## 2018-02-14 MED ORDER — SODIUM CHLORIDE 0.9% FLUSH: 3.0000 mL | INTRAVENOUS | Status: DC | PRN

## 2018-02-14 NOTE — ED Provider Notes (Signed)
[facility] DEPT Provider Note   CSN: [id number] Arrival date & time: 02/14/18  1929     History   Chief Complaint Chief Complaint  Patient presents with  . Stroke Symptoms    HPI [name] is a 59 y.o. female.  The history is provided by the patient.  Cerebrovascular Accident  This is a new problem. The current episode started less than 1 hour ago. The problem occurs constantly. The problem has not changed since onset.Pertinent negatives include no chest pain, no abdominal pain, no headaches and no shortness of breath. Associated symptoms comments: Difficulty speaking, word salad. Nothing aggravates the symptoms. Nothing relieves the symptoms. She has tried nothing for the symptoms. The treatment provided no relief.    Past Medical History:  Diagnosis Date  . Arthritis   . Asthma    triggered with Mindi Dickson perfumes and cigarette smoke  . Cancer ([place])   . Colitis   . Diabetes mellitus without complication ([place])   . History of radiation therapy 11/22/16-01/10/17   left breast 50.4 Gy in 28 fractions, axillary region 45 Gy in 25 fractions, lumpectomy cavity boost 10 Gy tin 5 fractions  . Hypertension   . Neuropathy     Patient Active Problem List   Diagnosis Date Noted  . TIA (transient ischemic attack) 02/14/2018  . Cancer ([place])   . Metastatic malignant neoplasm ([place])   . Goals of care, counseling/discussion   . Brain metastasis ([place]) 01/26/2018  . Stage IV breast cancer in female [facility]) 01/26/2018  . Ulcerative colitis ([place]) 01/26/2018  . Type 2 diabetes mellitus ([place]) 01/26/2018  . Asthma 01/26/2018  . Neuropathy 01/26/2018  . Cerebral embolism with cerebral infarction 01/26/2018  . Ulcerative colitis with rectal bleeding ([place]) 05/31/2017  . Ulcerative colitis with complication ([place]) 78/29/5621  . Lactic acidosis 07/19/2016  . Antineoplastic chemotherapy induced pancytopenia ([place])   . GIB (gastrointestinal bleeding)  06/07/2016  . Hypertension   . Diabetes mellitus without complication ([place])   . Ulcerative colitis without complications ([place]) 30/86/5784  . Hematochezia   . Generalized abdominal pain   . Nausea vomiting and diarrhea   . Diarrhea of presumed infectious origin   . Hyponatremia 05/18/2016  . Hypokalemia 05/18/2016  . Hypomagnesemia 05/18/2016  . Dehydration 05/18/2016  . Blood in stool 05/18/2016  . Thrombocytopenia ([place]) 05/18/2016  . GI bleeding 05/18/2016  . Port catheter in place 05/11/2016  . Breast cancer of upper-outer quadrant of left female breast ([place]) 05/02/2016  . S/P left THA, AA 03/06/2016  . Rectal bleeding 07/14/2015  . LLQ abdominal pain 07/14/2015  . Special screening for malignant neoplasms, colon 07/14/2015  . Loss of weight 07/14/2015  . Family history of colon cancer 07/14/2015    Past Surgical History:  Procedure Laterality Date  . ABDOMINAL HYSTERECTOMY    . BREAST LUMPECTOMY WITH RADIOACTIVE SEED AND SENTINEL LYMPH NODE BIOPSY Left 10/09/2016   Procedure: LEFT BREAST LUMPECTOMY WITH RADIOACTIVE SEED AND L4EFT AXILLARY SENTINEL LYMPH NODE BIOPSY;  Surgeon: [name], MD;  Location: [facility];  Service: General;  Laterality: Left;  . DILATION AND CURETTAGE OF UTERUS    . KNEE ARTHROSCOPY Left   . PORTACATH PLACEMENT Right 05/08/2016   Procedure: INSERTION PORT-A-CATH WITH [place];  Surgeon: [name], MD;  Location: [facility];  Service: General;  Laterality: Right;  . TONSILLECTOMY    . TOTAL HIP ARTHROPLASTY Right   . TOTAL HIP ARTHROPLASTY Left 03/06/2016   Procedure:  LEFT TOTAL HIP ARTHROPLASTY ANTERIOR APPROACH;  Surgeon: [name], MD;  Location: [facility];  Service: Orthopedics;  Laterality: Left;     OB History   No obstetric history on file.      Home Medications    Prior to Admission medications   Medication Sig Start Date End Date Taking? Authorizing Provider  anastrozole (ARIMIDEX) 1 MG tablet Take 1  tablet (1 mg total) by mouth daily. 10/08/17  Yes Truitt Merle, MD  apixaban (ELIQUIS) 5 MG TABS tablet Take 1 tablet (5 mg total) by mouth 2 (two) times daily. 02/01/18  Yes Georgette Shell, MD  aspirin EC 81 MG tablet Take 81 mg by mouth 2 (two) times daily.   Yes [provider]  atorvastatin (LIPITOR) 20 MG tablet Take 1 tablet (20 mg total) by mouth daily. 01/31/18 01/31/19 Yes Georgette Shell, MD  dexamethasone (DECADRON) 4 MG tablet Take 1 tablet (4 mg total) by mouth 2 (two) times daily with a meal. 01/31/18  Yes Tyler Pita, MD  gabapentin (NEURONTIN) 100 MG capsule Take 200 mg by mouth at bedtime.    Yes [provider]  Garlic (GARLIQUE) 665 MG TBEC Take 400 mg by mouth daily.   Yes [provider]  Ginkgo Biloba Extract 120 MG CAPS Take 120 mg by mouth daily with breakfast.   Yes [provider]  Mesalamine 800 MG TBEC 2 TABLETS BY MOUTH THREE TIMES A DAY Patient taking differently: Take 2 tablets by mouth 2 (two) times daily.  07/26/17  Yes Ladene Artist, MD  metFORMIN (GLUCOPHAGE-XR) 500 MG 24 hr tablet Take 1,000 mg by mouth at bedtime.   Yes [provider]  metoprolol tartrate (LOPRESSOR) 25 MG tablet Take 1 tablet (25 mg total) by mouth 2 (two) times daily. 01/31/18 01/31/19 Yes Georgette Shell, MD  potassium chloride SA (KLOR-CON M20) 20 MEQ tablet Take 1 tablet (20 mEq total) by mouth 2 (two) times daily. Patient taking differently: Take 20 mEq by mouth daily with breakfast.  05/10/17  Yes Truitt Merle, MD  albuterol (PROVENTIL HFA;VENTOLIN HFA) 108 (90 Base) MCG/ACT inhaler Inhale 1-2 puffs into the lungs every 6 (six) hours as needed for wheezing or shortness of breath.    [provider]  dicyclomine (BENTYL) 20 MG tablet Take 1 tablet (20 mg total) by mouth 4 (four) times daily -  before meals and at bedtime. Patient taking differently: Take 20 mg by mouth 3 (three) times daily as needed for spasms.  06/05/17   Truitt Merle,  MD  loperamide (IMODIUM) 2 MG capsule Take 4 mg by mouth as needed for diarrhea or loose stools.    [provider]    Family History Family History  Problem Relation Age of Onset  . Colon cancer Father   . Stomach cancer Paternal Uncle   . Stomach cancer Paternal Uncle   . Melanoma Brother   . Thyroid cancer Brother   . Breast cancer Maternal Aunt   . Breast cancer Maternal Aunt   . Breast cancer Cousin     Social History Social History   Tobacco Use  . Smoking status: Former Smoker    Packs/day: 1.00    Years: 29.00    Pack years: 29.00    Types: Cigarettes    Last attempt to quit: 10/27/2002    Years since quitting: 15.3  . Smokeless tobacco: Never Used  Substance Use Topics  . Alcohol use: No    Alcohol/week: 0.0 standard  drinks  . Drug use: No     Allergies   Gadavist [gadobutrol]; Lisinopril; Augmentin [amoxicillin-pot clavulanate]; Benadryl [diphenhydramine]; and Losartan potassium   Review of Systems Review of Systems  Constitutional: Negative for chills and fever.  HENT: Negative for ear pain, sore throat and trouble swallowing.   Eyes: Negative for photophobia, pain, discharge, redness, itching and visual disturbance.  Respiratory: Negative for cough and shortness of breath.   Cardiovascular: Negative for chest pain and palpitations.  Gastrointestinal: Negative for abdominal pain and vomiting.  Genitourinary: Negative for dysuria and hematuria.  Musculoskeletal: Negative for arthralgias and back pain.  Skin: Negative for color change and rash.  Neurological: Positive for dizziness and speech difficulty. Negative for tremors, seizures, syncope, weakness and headaches.  All other systems reviewed and are negative.    Physical Exam Updated Vital Signs BP 118/74 (BP Location: Right Arm)   Pulse 90   Temp 98.3 F (36.8 C) (Oral)   Resp 18   Ht 5' 5"  (1.651 m)   Wt 77.7 kg   SpO2 100%   BMI 28.51 kg/m   Physical Exam Vitals signs and  nursing note reviewed.  Constitutional:      General: She is not in acute distress.    Appearance: She is well-developed.  HENT:     Head: Normocephalic and atraumatic.     Nose: Nose normal.     Mouth/Throat:     Mouth: Mucous membranes are moist.  Eyes:     Conjunctiva/sclera: Conjunctivae normal.     Pupils: Pupils are equal, round, and reactive to light.  Neck:     Musculoskeletal: Neck supple.  Cardiovascular:     Rate and Rhythm: Normal rate and regular rhythm.     Pulses: Normal pulses.     Heart sounds: Normal heart sounds. No murmur.  Pulmonary:     Effort: Pulmonary effort is normal. No respiratory distress.     Breath sounds: Normal breath sounds.  Abdominal:     General: Abdomen is flat.     Palpations: Abdomen is soft.     Tenderness: There is no abdominal tenderness.  Skin:    General: Skin is warm and dry.  Neurological:     Mental Status: She is alert and oriented to person, place, and time.     Comments: Patient with 5+ out of 5 strength throughout, normal sensation, rightward gaze, unable to past midline with her gaze to the left, no visual field deficit, normal finger-to-nose finger, no drift, patient with some dysarthria, word salad and inability to get the words that she wants, aphasia  Psychiatric:        Mood and Affect: Mood normal.      ED Treatments / Results  Labs (all labs ordered are listed, but only abnormal results are displayed) Labs Reviewed  CBC - Abnormal; Notable for the following components:      Result Value   WBC 10.8 (*)    RBC 5.34 (*)    All other components within normal limits  DIFFERENTIAL - Abnormal; Notable for the following components:   Neutro Abs 8.0 (*)    Monocytes Absolute 1.2 (*)    All other components within normal limits  COMPREHENSIVE METABOLIC PANEL - Abnormal; Notable for the following components:   Glucose, Bld 120 (*)    BUN 21 (*)    All other components within normal limits  URINALYSIS, ROUTINE W  REFLEX MICROSCOPIC - Abnormal; Notable for the following components:   Color, Urine  STRAW (*)    Specific Gravity, Urine >1.046 (*)    All other components within normal limits  I-STAT CHEM 8, ED - Abnormal; Notable for the following components:   Glucose, Bld 120 (*)    Calcium, Ion 1.14 (*)    Hemoglobin 15.6 (*)    All other components within normal limits  I-STAT BETA HCG BLOOD, ED ([facility], [facility], [facility] ONLY) - Abnormal; Notable for the following components:   I-stat hCG, quantitative 6.7 (*)    All other components within normal limits  PROTIME-INR  APTT  TROPONIN I  HEMOGLOBIN A1C  LIPID PANEL  URINALYSIS, DIPSTICK ONLY  I-STAT TROPONIN, ED  CBG MONITORING, ED    EKG None  Radiology Ct Angio Head W Or Wo Contrast  Result Date: 02/14/2018 CLINICAL DATA:  Stroke.  Aphasia EXAM: CT ANGIOGRAPHY HEAD AND NECK TECHNIQUE: Multidetector CT imaging of the head and neck was performed using the standard protocol during bolus administration of intravenous contrast. Multiplanar CT image reconstructions and MIPs were obtained to evaluate the vascular anatomy. Carotid stenosis measurements (when applicable) are obtained utilizing NASCET criteria, using the distal internal carotid diameter as the denominator. CONTRAST:  128m ISOVUE-370 IOPAMIDOL (ISOVUE-370) INJECTION 76% COMPARISON:  CT head 02/14/2018 FINDINGS: CTA NECK FINDINGS Aortic arch: Standard branching. Imaged portion shows no evidence of aneurysm or dissection. No significant stenosis of the major arch vessel origins. Right carotid system: Negative for stenosis or atherosclerotic disease Left carotid system: Negative for stenosis or atherosclerotic disease Vertebral arteries: Both vertebral arteries widely patent without stenosis. Skeleton: Cervical spondylosis.  No acute skeletal abnormality. Other neck: Negative for mass or adenopathy. Upper chest: Moderately large right effusion. Peri carina adenopathy. Anterior mediastinal adenopathy. Review  of the MIP images confirms the above findings CTA HEAD FINDINGS Anterior circulation: Cavernous carotid widely patent bilaterally. Anterior and middle cerebral arteries widely patent bilaterally. Posterior circulation: Both vertebral arteries patent to the basilar. PICA patent bilaterally. Superior cerebellar and posterior cerebral arteries widely patent bilaterally. Fetal origin left posterior cerebral artery. Venous sinuses: Patent Anatomic variants: None Delayed phase: Normal enhancement on delayed imaging. Hypodensity in the corona radiata on the right unchanged. Review of the MIP images confirms the above findings IMPRESSION: 1. Negative for emergent large vessel occlusion. 2. No significant carotid or vertebral artery stenosis. No significant intracranial stenosis. Electronically Signed   By: CFranchot GalloM.D.   On: 02/14/2018 20:20   Dg Chest 2 View  Result Date: 02/14/2018 CLINICAL DATA:  58year old female with acute onset difficulty speaking. Breast cancer. Multiple small embolic brain infarcts last month. EXAM: CHEST - 2 VIEW COMPARISON:  CTA head and neck earlier today. Portable chest 01/30/2018. FINDINGS: Moderate right pleural effusion appears mildly increased from earlier this month. Associated right lung base opacification. Mediastinal contours remain within normal limits. No pneumothorax or pulmonary edema. The left lung appears clear. No acute osseous abnormality identified. Negative visible bowel gas pattern. IMPRESSION: Moderate right pleural effusion appears increased from earlier this month. Electronically Signed   By: HGenevie AnnM.D.   On: 02/14/2018 21:39   Ct Angio Neck W And/or Wo Contrast  Result Date: 02/14/2018 CLINICAL DATA:  Stroke.  Aphasia EXAM: CT ANGIOGRAPHY HEAD AND NECK TECHNIQUE: Multidetector CT imaging of the head and neck was performed using the standard protocol during bolus administration of intravenous contrast. Multiplanar CT image reconstructions and MIPs were  obtained to evaluate the vascular anatomy. Carotid stenosis measurements (when applicable) are obtained utilizing NASCET criteria, using the distal internal  carotid diameter as the denominator. CONTRAST:  122m ISOVUE-370 IOPAMIDOL (ISOVUE-370) INJECTION 76% COMPARISON:  CT head 02/14/2018 FINDINGS: CTA NECK FINDINGS Aortic arch: Standard branching. Imaged portion shows no evidence of aneurysm or dissection. No significant stenosis of the major arch vessel origins. Right carotid system: Negative for stenosis or atherosclerotic disease Left carotid system: Negative for stenosis or atherosclerotic disease Vertebral arteries: Both vertebral arteries widely patent without stenosis. Skeleton: Cervical spondylosis.  No acute skeletal abnormality. Other neck: Negative for mass or adenopathy. Upper chest: Moderately large right effusion. Peri carina adenopathy. Anterior mediastinal adenopathy. Review of the MIP images confirms the above findings CTA HEAD FINDINGS Anterior circulation: Cavernous carotid widely patent bilaterally. Anterior and middle cerebral arteries widely patent bilaterally. Posterior circulation: Both vertebral arteries patent to the basilar. PICA patent bilaterally. Superior cerebellar and posterior cerebral arteries widely patent bilaterally. Fetal origin left posterior cerebral artery. Venous sinuses: Patent Anatomic variants: None Delayed phase: Normal enhancement on delayed imaging. Hypodensity in the corona radiata on the right unchanged. Review of the MIP images confirms the above findings IMPRESSION: 1. Negative for emergent large vessel occlusion. 2. No significant carotid or vertebral artery stenosis. No significant intracranial stenosis. Electronically Signed   By: CFranchot GalloM.D.   On: 02/14/2018 20:20   Ct Head Code Stroke Wo Contrast  Result Date: 02/14/2018 CLINICAL DATA:  Code stroke. Initial evaluation for acute speech difficulty. EXAM: CT HEAD WITHOUT CONTRAST TECHNIQUE:  Contiguous axial images were obtained from the base of the skull through the vertex without intravenous contrast. COMPARISON:  Prior CT from 01/26/2018. FINDINGS: Brain: 11 mm hypodensity involving the deep white matter of the posterior right centrum semi ovale, likely reflecting sequelae of subacute ischemia, as a small acute infarct was previously seen within this region on prior brain MRI (series 2, image 22). No other evidence for acute or subacute infarct. Gray-white matter differentiation otherwise maintained. No acute intracranial hemorrhage. No mass lesion, midline shift or mass effect. No hydrocephalus. No extra-axial fluid collection. Vascular: No hyperdense vessel. Skull: Scalp soft tissues and calvarium within normal limits. Sinuses/Orbits: Globes and orbital soft tissues within normal limits. Paranasal sinuses and mastoid air cells are clear. Other: None. ASPECTS ([facility] IiStroke Program Early CT Score) - Ganglionic level infarction (caudate, lentiform nuclei, internal capsule, insula, M1-M3 cortex): 7 - Supraganglionic infarction (M4-M6 cortex): 3 Total score (0-10 with 10 being normal): 10 IMPRESSION: 1. Approximate 1 cm hypodensity involving the deep white matter of the posterior right centrum semi ovale, likely reflecting sequelae of subacute ischemia/interval evolution of previously seen infarct at this location. 2. No other acute intracranial abnormality identified. No hemorrhage. 3. ASPECTS is 10. Critical Value/emergent results were called by telephone at the time of interpretation on 02/14/2018 at 8:08 pm to Dr. ALennice Sites, who verbally acknowledged these results. Electronically Signed   By: BJeannine BogaM.D.   On: 02/14/2018 20:12    Procedures .Critical Care Performed by: CLennice Sites DO Authorized by: CLennice Sites DO   Critical care provider statement:    Critical care time (minutes):  35   Critical care was necessary to treat or prevent imminent or life-threatening  deterioration of the following conditions:  CNS failure or compromise   Critical care was time spent personally by me on the following activities:  Development of treatment plan with patient or surrogate, discussions with consultants, evaluation of patient's response to treatment, discussions with primary provider, interpretation of cardiac output measurements, ordering and performing treatments and interventions, ordering and review  of laboratory studies, ordering and review of radiographic studies, pulse oximetry, re-evaluation of patient's condition and review of old charts   I assumed direction of critical care for this patient from another provider in my specialty: no     (including critical care time)  Medications Ordered in ED Medications  aspirin EC tablet 81 mg (81 mg Oral Given 02/15/18 0039)  anastrozole (ARIMIDEX) tablet 1 mg (has no administration in time range)  atorvastatin (LIPITOR) tablet 20 mg (has no administration in time range)  dexamethasone (DECADRON) tablet 2 mg (has no administration in time range)  Mesalamine (ASACOL) DR capsule 1,600 mg (has no administration in time range)  apixaban (ELIQUIS) tablet 5 mg (5 mg Oral Given 02/15/18 0039)  gabapentin (NEURONTIN) capsule 200 mg (200 mg Oral Given 02/15/18 0039)  acetaminophen (TYLENOL) tablet 650 mg (650 mg Oral Given 02/15/18 0107)    Or  acetaminophen (TYLENOL) solution 650 mg ( Per Tube See Alternative 02/15/18 0107)    Or  acetaminophen (TYLENOL) suppository 650 mg ( Rectal See Alternative 02/15/18 0107)  0.9 %  sodium chloride infusion ( Intravenous New Bag/Given 02/15/18 0041)  sodium chloride flush (NS) 0.9 % injection 3 mL (3 mLs Intravenous Given 02/15/18 0048)  sodium chloride flush (NS) 0.9 % injection 3 mL (has no administration in time range)  0.9 %  sodium chloride infusion (has no administration in time range)  levETIRAcetam (KEPPRA) IVPB 1000 mg/100 mL premix (1,000 mg Intravenous New Bag/Given 02/15/18 0110)    levETIRAcetam (KEPPRA) tablet 500 mg (has no administration in time range)  sodium chloride flush (NS) 0.9 % injection 3 mL (0 mLs Intravenous Duplicate 3/90/30 0923)  iopamidol (ISOVUE-370) 76 % injection 100 mL (100 mLs Intravenous Contrast Given 02/14/18 1959)   stroke: mapping our early stages of recovery book ( Does not apply Given 02/15/18 0047)     Initial Impression / Assessment and Plan / ED Course  I have reviewed the triage vital signs and the nursing notes.  Pertinent labs & imaging results that were available during my care of the patient were reviewed by me and considered in my medical decision making (see chart for details).     Brandi Dickson is a 58 year old female with history of hypertension, diabetes, breast cancer with metastasis to the brain, liver status post recent radiation therapy to the brain who presents to the ED with strokelike symptoms.  Patient with normal vitals.  No fever.  30 minutes prior to arrival patient had difficulty with speech.  She has had chronic and intermittent dizziness, inability to cross midline to the left with her eyes from prior stroke several weeks ago.  She is now status post radiation treatment to her brain.  She is about to start chemotherapy next week.  Patient was made a code stroke upon arrival due to speech changes.  She has dysarthria.  Patient otherwise has normal neurological exam.  No visual field deficit.  CT scan of the head and CTA of the head and neck were overall unremarkable.  Radiology states on the phone that there is no acute findings from prior imaging.  Patient is not a TPA candidate due to anticoagulation, brain metastasis.   Teleneurology was consulted and they recommend admission and states that this could have been a seizure.  They recommend EEG.  Do not recommend repeat MRI at this time. Likely TIA vs simple partial sz per neurology. Otherwise patient had no significant electrolyte abnormality, leukocytosis, anemia.   Troponin within normal  limits.  No urine infection.  Patient was admitted to hospitalist service for further work-up.  Was hemodynamically stable throughout my care.  This chart was dictated using voice recognition software.  Despite best efforts to proofread,  errors can occur which can change the documentation meaning.   Final Clinical Impressions(s) / ED Diagnoses   Final diagnoses:  Stroke-like symptoms    ED Discharge Orders    None       Lennice Sites, DO 02/15/18 0112

## 2018-02-14 NOTE — ED Triage Notes (Signed)
Pt out to eat approx 30 minutes ago, began having difficulty speaking, hx of CVA 12/28.  No arm drift present, has diplopia from recent CVA.  Currently taking radiation for cancer, has 2 tx's left.

## 2018-02-14 NOTE — ED Notes (Signed)
Bed: WA17 Expected date:  Expected time:  Means of arrival:  Comments: 

## 2018-02-14 NOTE — H&P (Signed)
Brandi Dickson CZY:[id number] DOB: 12-28-1960 DOA: 02/14/2018     PCP: Orpah Melter, MD   Outpatient Specialists:      Oncology   Dr. Olena Mater   Patient arrived to ER on 02/14/18 at [facility]  Patient coming from: home Lives   With family    Chief Complaint:  Chief Complaint  Patient presents with  . Stroke Symptoms    HPI: Brandi Dickson is a 58 y.o. female with medical history significant of metastatic  breast cancer to the brain on anastrozole, peripheral neuropathy, type 2 diabetes, hypertension, ulcerative colitis , CVA    Presented with sudden onset around 19:15 of difficulty speaking and instead of words she was muttering, nonsensical utterances. Able to walk no facial droop. When she got to ER she was a bit ataxia. NO localized weakness.  Family was out with friends and she could not speak. Since arrival to ER she have improved and currently resolved. The episode lust about 30 min,  She has persistent diplopia which is unchanged from prior Recent radiation therapy for brain metastases from breast cancer. No severe shortness of breath but dry cough.   Last admission 26 January 2018 at that time presented with gaze deviation to the right and unable to cross midline MRI brain showed small scattered acute infarct in the bilateral cerebrum, right cerebellum and left posterior pons which explains her visual symptoms. CT of the head and neck also showed malignant appearing adenopathy in the chest and left lower neck, bilateral lobe subpleural nodules  suggestive of lymphatics spread of tumor with moderate right pleural effusion peritoneal nodularity with metastatic hepatic and splenic disease. Brain MRI with contrast showed at least 14 focal lesions suggestive of malignancy. On  apixaban 49m BID and ASA was discharged on 3 January.  Day of discharge she undergone liver biopsy by IR. Pleural fluid was collected showing MALIGNANT CELLS CONSISTENT WITH METASTATIC  ADENOCARCINOMA.  Regarding pertinent Chronic problems:   Hx of Diabetes mellitus type 2, controlled A1c of 5.9. on metformin. Hx of HTN recently decreased Beta- blocker, ACE on hold  30 of ulcerative colitis not taking any steroids for this on mesalamine by GI While in ER: Been seen by tele neurologist who recommends EEG a candidate for TPA The following Work up has been ordered so far:  Orders Placed This Encounter  Procedures  . CT HEAD CODE STROKE WO CONTRAST  . CT Angio Head W or Wo Contrast  . CT Angio Neck W and/or Wo Contrast  . Protime-INR  . APTT  . CBC  . Differential  . Comprehensive metabolic panel  . Urinalysis, Routine w reflex microscopic  . Diet NPO time specified  . Cardiac monitoring  . Stroke swallow screen  . NIH Stroke Scale  . Modified Stroke Scale (mNIHSS) Document mNIHSS assessment every 2 hours for a total of 12 hours  . Saline Lock IV, Maintain IV access  . If O2 Sat <94% administer O2 at 2 liters/minute via nasal cannula  . CJaneece Riggersto [phone] . Consult to hospitalist  . Pulse oximetry, continuous  . I-stat troponin, ED  . CBG monitoring, ED  . I-Stat Chem 8, ED  . I-Stat beta hCG blood, ED  . ED EKG    Following Medications were ordered in ER: Medications  sodium chloride flush (NS) 0.9 % injection 3 mL (has no administration in time range)  iopamidol (ISOVUE-370) 76 % injection 100 mL (  100 mLs Intravenous Contrast Given 02/14/18 1959)    Significant initial  Findings: Abnormal Labs Reviewed  CBC - Abnormal; Notable for the following components:      Result Value   WBC 10.8 (*)    RBC 5.34 (*)    All other components within normal limits  DIFFERENTIAL - Abnormal; Notable for the following components:   Neutro Abs 8.0 (*)    Monocytes Absolute 1.2 (*)    All other components within normal limits  I-STAT CHEM 8, ED - Abnormal; Notable for the following components:   Glucose, Bld 120 (*)     Calcium, Ion 1.14 (*)    Hemoglobin 15.6 (*)    All other components within normal limits  I-STAT BETA HCG BLOOD, ED ([facility], [facility], [facility] ONLY) - Abnormal; Notable for the following components:   I-stat hCG, quantitative 6.7 (*)    All other components within normal limits     Lactic Acid, Venous    Component Value Date/Time   LATICACIDVEN 2.0 (HH) 07/20/2016 0420    Na 137 K 3.8  Cr    Stable  Lab Results  Component Value Date   CREATININE 0.80 02/14/2018   CREATININE 0.77 01/31/2018   CREATININE 0.84 01/28/2018      WBC  10.8 HG/HCT Up from baseline see below    Component Value Date/Time   HGB 15.6 (H) 02/14/2018 2005   HGB 12.5 01/18/2017 0853   HCT 46.0 02/14/2018 2005   HCT 38.2 01/18/2017 0853   INR 0.96   trop 0.01  Troponin (Point of Care Test) Recent Labs    02/14/18 2001  TROPIPOC 0.01      UA ordered   CT HEAD CT angio head and neck NON acute  CXR -  NON acute   ECG:  Not ordered   ED Triage Vitals  Enc Vitals Group     BP 02/14/18 1940 111/78     Pulse Rate 02/14/18 1940 88     Resp 02/14/18 1940 (!) 21     Temp 02/14/18 1940 97.9 F (36.6 C)     Temp Source 02/14/18 1940 Oral     SpO2 02/14/18 1940 96 %     Weight 02/14/18 1949 164 lb 14.5 oz (74.8 kg)     Height 02/14/18 1949 5' 4"  (1.626 m)     Head Circumference --      Peak Flow --      Pain Score 02/14/18 1949 0     Pain Loc --      Pain Edu? --      Excl. in [place]? --   TMAX(24)@       Latest  Blood pressure 115/77, pulse 90, temperature 97.9 F (36.6 C), temperature source Oral, resp. rate 20, height 5' 4"  (1.626 m), weight 74.8 kg, SpO2 100 %.   ER Provider Called:    Tele Neurology   They Recommend admit to medicine EEG Will see on arrival   Hospitalist was called for admission for TIA   Review of Systems:    Pertinent positives include: word salad,   Constitutional:  No weight loss, night sweats, Fevers, chills, fatigue, weight loss  HEENT:  No headaches,  Difficulty swallowing,Tooth/dental problems,Sore throat,  No sneezing, itching, ear ache, nasal congestion, post nasal drip,  Cardio-vascular:  No chest pain, Orthopnea, PND, anasarca, dizziness, palpitations.no Bilateral lower extremity swelling  GI:  No heartburn, indigestion, abdominal pain, nausea, vomiting, diarrhea, change in bowel habits, loss of appetite, melena,  blood in stool, hematemesis Resp:  no shortness of breath at rest. No dyspnea on exertion, No excess mucus, no productive cough, No non-productive cough, No coughing up of blood.No change in color of mucus.No wheezing. Skin:  no rash or lesions. No jaundice GU:  no dysuria, change in color of urine, no urgency or frequency. No straining to urinate.  No flank pain.  Musculoskeletal:  No joint pain or no joint swelling. No decreased range of motion. No back pain.  Psych:  No change in mood or affect. No depression or anxiety. No memory loss.  Neuro: no localizing neurological complaints, no tingling, no weakness, no double vision, no gait abnormality, no slurred speech, no confusion  All systems reviewed and apart from [place] all are negative  Past Medical History:   Past Medical History:  Diagnosis Date  . Arthritis   . Asthma    triggered with [name] perfumes and cigarette smoke  . Cancer ([place])   . Colitis   . Diabetes mellitus without complication ([place])   . History of radiation therapy 11/22/16-01/10/17   left breast 50.4 Gy in 28 fractions, axillary region 45 Gy in 25 fractions, lumpectomy cavity boost 10 Gy tin 5 fractions  . Hypertension   . Neuropathy       Past Surgical History:  Procedure Laterality Date  . ABDOMINAL HYSTERECTOMY    . BREAST LUMPECTOMY WITH RADIOACTIVE SEED AND SENTINEL LYMPH NODE BIOPSY Left 10/09/2016   Procedure: LEFT BREAST LUMPECTOMY WITH RADIOACTIVE SEED AND L4EFT AXILLARY SENTINEL LYMPH NODE BIOPSY;  Surgeon: [name], MD;  Location: [facility];  Service:  General;  Laterality: Left;  . DILATION AND CURETTAGE OF UTERUS    . KNEE ARTHROSCOPY Left   . PORTACATH PLACEMENT Right 05/08/2016   Procedure: INSERTION PORT-A-CATH WITH [place];  Surgeon: [name], MD;  Location: [facility];  Service: General;  Laterality: Right;  . TONSILLECTOMY    . TOTAL HIP ARTHROPLASTY Right   . TOTAL HIP ARTHROPLASTY Left 03/06/2016   Procedure: LEFT TOTAL HIP ARTHROPLASTY ANTERIOR APPROACH;  Surgeon: [name], MD;  Location: [facility];  Service: Orthopedics;  Laterality: Left;    Social History:  Ambulatory   independently      reports that she quit smoking about 15 years ago. Her smoking use included cigarettes. She has a 29.00 pack-year smoking history. She has never used smokeless tobacco. She reports that she does not drink alcohol or use drugs.     Family History:   Family History  Problem Relation Age of Onset  . Colon cancer Father   . Stomach cancer Paternal Uncle   . Stomach cancer Paternal Uncle   . Melanoma Brother   . Thyroid cancer Brother   . Breast cancer Maternal Aunt   . Breast cancer Maternal Aunt   . Breast cancer Cousin     Allergies: Allergies  Allergen Reactions  . Gadavist [Gadobutrol] Nausea And Vomiting    Severe vomiting within 1 min of injection.   . Lisinopril Palpitations  . Augmentin [Amoxicillin-Pot Clavulanate] Other (See Comments)    sts gives her a yeast infection Has patient had a PCN reaction causing immediate rash, facial/tongue/throat swelling, SOB or lightheadedness with hypotension: no Has patient had a PCN reaction causing severe rash involving mucus membranes or skin necrosis: no Has patient had a PCN reaction that required hospitalization no Has patient had a PCN reaction occurring within the last 10 years: unknown If all of the above answers  are "NO", then may proceed with Cephalosporin use.   . Benadryl [Diphenhydramine] Itching and Anxiety    Per pt: "Makes my skin crawl"; makes pt  sensitive to touch  . Losartan Potassium Palpitations     Prior to Admission medications   Medication Sig Start Date End Date Taking? Authorizing Provider  anastrozole (ARIMIDEX) 1 MG tablet Take 1 tablet (1 mg total) by mouth daily. 10/08/17  Yes Truitt Merle, MD  apixaban (ELIQUIS) 5 MG TABS tablet Take 1 tablet (5 mg total) by mouth 2 (two) times daily. 02/01/18  Yes Georgette Shell, MD  aspirin EC 81 MG tablet Take 81 mg by mouth 2 (two) times daily.   Yes [provider]  atorvastatin (LIPITOR) 20 MG tablet Take 1 tablet (20 mg total) by mouth daily. 01/31/18 01/31/19 Yes Georgette Shell, MD  dexamethasone (DECADRON) 4 MG tablet Take 1 tablet (4 mg total) by mouth 2 (two) times daily with a meal. 01/31/18  Yes Tyler Pita, MD  gabapentin (NEURONTIN) 100 MG capsule Take 200 mg by mouth at bedtime.    Yes [provider]  Garlic (GARLIQUE) 595 MG TBEC Take 400 mg by mouth daily.   Yes [provider]  Ginkgo Biloba Extract 120 MG CAPS Take 120 mg by mouth daily with breakfast.   Yes [provider]  Mesalamine 800 MG TBEC 2 TABLETS BY MOUTH THREE TIMES A DAY Patient taking differently: Take 2 tablets by mouth 2 (two) times daily.  07/26/17  Yes Ladene Artist, MD  metFORMIN (GLUCOPHAGE-XR) 500 MG 24 hr tablet Take 1,000 mg by mouth at bedtime.   Yes [provider]  metoprolol tartrate (LOPRESSOR) 25 MG tablet Take 1 tablet (25 mg total) by mouth 2 (two) times daily. 01/31/18 01/31/19 Yes Georgette Shell, MD  potassium chloride SA (KLOR-CON M20) 20 MEQ tablet Take 1 tablet (20 mEq total) by mouth 2 (two) times daily. Patient taking differently: Take 20 mEq by mouth daily with breakfast.  05/10/17  Yes Truitt Merle, MD  albuterol (PROVENTIL HFA;VENTOLIN HFA) 108 (90 Base) MCG/ACT inhaler Inhale 1-2 puffs into the lungs every 6 (six) hours as needed for wheezing or shortness of breath.    [provider]  dicyclomine (BENTYL) 20 MG tablet  Take 1 tablet (20 mg total) by mouth 4 (four) times daily -  before meals and at bedtime. Patient taking differently: Take 20 mg by mouth 3 (three) times daily as needed for spasms.  06/05/17   Truitt Merle, MD  loperamide (IMODIUM) 2 MG capsule Take 4 mg by mouth as needed for diarrhea or loose stools.    [provider]   Physical Exam: Blood pressure 115/77, pulse 90, temperature 97.9 F (36.6 C), temperature source Oral, resp. rate 20, height 5' 4"  (1.626 m), weight 74.8 kg, SpO2 100 %. 1. General:  in No Acute distress   Chronically ill -appearing 2. Psychological: Alert and   Oriented 3. Head/ENT:    Dry Mucous Membranes                          Head Non traumatic, neck supple                           Poor Dentition 4. SKIN:  decreased Skin turgor,  Skin clean Dry and intact no rash 5. Heart: Regular rate and rhythm no  Murmur, no Rub or gallop  6. Lungs:  , no wheezes or crackles   7. Abdomen: Soft non-tender, Non distended bowel sounds present 8. Lower extremities: no clubbing, cyanosis, or edema 9. Neurologically Grossly intact, moving all 4 extremities equally   strength 5 out of 5 in all 4 extremities patient unable to deviate gaze past midline to the left which is chronic  10. MSK: Normal range of motion   LABS:     Recent Labs  Lab 02/14/18 1956 02/14/18 2005  WBC 10.8*  --   NEUTROABS 8.0*  --   HGB 14.0 15.6*  HCT 45.1 46.0  MCV 84.5  --   PLT 311  --    Basic Metabolic Panel: Recent Labs  Lab 02/14/18 2005  NA 137  K 3.8  CL 102  GLUCOSE 120*  BUN 19  CREATININE 0.80      No results for input(s): AST, ALT, ALKPHOS, BILITOT, PROT, ALBUMIN in the last 168 hours. No results for input(s): LIPASE, AMYLASE in the last 168 hours. No results for input(s): AMMONIA in the last 168 hours.    HbA1C: No results for input(s): HGBA1C in the last 72 hours. CBG: No results for input(s): GLUCAP in the last 168 hours.    Urine analysis:    Component  Value Date/Time   COLORURINE YELLOW 11/28/2017 2059   APPEARANCEUR CLEAR 11/28/2017 2059   LABSPEC 1.011 11/28/2017 2059   PHURINE 6.0 11/28/2017 2059   GLUCOSEU NEGATIVE 11/28/2017 2059   HGBUR NEGATIVE 11/28/2017 2059   BILIRUBINUR NEGATIVE 11/28/2017 2059   KETONESUR NEGATIVE 11/28/2017 2059   PROTEINUR NEGATIVE 11/28/2017 2059   UROBILINOGEN 0.2 12/30/2008 1317   NITRITE NEGATIVE 11/28/2017 2059   LEUKOCYTESUR TRACE (A) 11/28/2017 2059       Cultures:    Component Value Date/Time   SDES URINE, RANDOM 07/19/2016 1238   SPECREQUEST NONE 07/19/2016 1238   CULT 30,000 COLONIES/mL ESCHERICHIA COLI (A) 07/19/2016 1238   REPTSTATUS 07/21/2016 FINAL 07/19/2016 1238     Radiological Exams on Admission: Ct Angio Head W Or Wo Contrast  Result Date: 02/14/2018 CLINICAL DATA:  Stroke.  Aphasia EXAM: CT ANGIOGRAPHY HEAD AND NECK TECHNIQUE: Multidetector CT imaging of the head and neck was performed using the standard protocol during bolus administration of intravenous contrast. Multiplanar CT image reconstructions and MIPs were obtained to evaluate the vascular anatomy. Carotid stenosis measurements (when applicable) are obtained utilizing NASCET criteria, using the distal internal carotid diameter as the denominator. CONTRAST:  13m ISOVUE-370 IOPAMIDOL (ISOVUE-370) INJECTION 76% COMPARISON:  CT head 02/14/2018 FINDINGS: CTA NECK FINDINGS Aortic arch: Standard branching. Imaged portion shows no evidence of aneurysm or dissection. No significant stenosis of the major arch vessel origins. Right carotid system: Negative for stenosis or atherosclerotic disease Left carotid system: Negative for stenosis or atherosclerotic disease Vertebral arteries: Both vertebral arteries widely patent without stenosis. Skeleton: Cervical spondylosis.  No acute skeletal abnormality. Other neck: Negative for mass or adenopathy. Upper chest: Moderately large right effusion. Peri carina adenopathy. Anterior mediastinal  adenopathy. Review of the MIP images confirms the above findings CTA HEAD FINDINGS Anterior circulation: Cavernous carotid widely patent bilaterally. Anterior and middle cerebral arteries widely patent bilaterally. Posterior circulation: Both vertebral arteries patent to the basilar. PICA patent bilaterally. Superior cerebellar and posterior cerebral arteries widely patent bilaterally. Fetal origin left posterior cerebral artery. Venous sinuses: Patent Anatomic variants: None Delayed phase: Normal enhancement on delayed imaging. Hypodensity in the corona radiata on the right unchanged. Review of the MIP images confirms the above findings  IMPRESSION: 1. Negative for emergent large vessel occlusion. 2. No significant carotid or vertebral artery stenosis. No significant intracranial stenosis. Electronically Signed   By: Franchot Gallo M.D.   On: 02/14/2018 20:20   Ct Angio Neck W And/or Wo Contrast  Result Date: 02/14/2018 CLINICAL DATA:  Stroke.  Aphasia EXAM: CT ANGIOGRAPHY HEAD AND NECK TECHNIQUE: Multidetector CT imaging of the head and neck was performed using the standard protocol during bolus administration of intravenous contrast. Multiplanar CT image reconstructions and MIPs were obtained to evaluate the vascular anatomy. Carotid stenosis measurements (when applicable) are obtained utilizing NASCET criteria, using the distal internal carotid diameter as the denominator. CONTRAST:  17m ISOVUE-370 IOPAMIDOL (ISOVUE-370) INJECTION 76% COMPARISON:  CT head 02/14/2018 FINDINGS: CTA NECK FINDINGS Aortic arch: Standard branching. Imaged portion shows no evidence of aneurysm or dissection. No significant stenosis of the major arch vessel origins. Right carotid system: Negative for stenosis or atherosclerotic disease Left carotid system: Negative for stenosis or atherosclerotic disease Vertebral arteries: Both vertebral arteries widely patent without stenosis. Skeleton: Cervical spondylosis.  No acute skeletal  abnormality. Other neck: Negative for mass or adenopathy. Upper chest: Moderately large right effusion. Peri carina adenopathy. Anterior mediastinal adenopathy. Review of the MIP images confirms the above findings CTA HEAD FINDINGS Anterior circulation: Cavernous carotid widely patent bilaterally. Anterior and middle cerebral arteries widely patent bilaterally. Posterior circulation: Both vertebral arteries patent to the basilar. PICA patent bilaterally. Superior cerebellar and posterior cerebral arteries widely patent bilaterally. Fetal origin left posterior cerebral artery. Venous sinuses: Patent Anatomic variants: None Delayed phase: Normal enhancement on delayed imaging. Hypodensity in the corona radiata on the right unchanged. Review of the MIP images confirms the above findings IMPRESSION: 1. Negative for emergent large vessel occlusion. 2. No significant carotid or vertebral artery stenosis. No significant intracranial stenosis. Electronically Signed   By: CFranchot GalloM.D.   On: 02/14/2018 20:20   Ct Head Code Stroke Wo Contrast  Result Date: 02/14/2018 CLINICAL DATA:  Code stroke. Initial evaluation for acute speech difficulty. EXAM: CT HEAD WITHOUT CONTRAST TECHNIQUE: Contiguous axial images were obtained from the base of the skull through the vertex without intravenous contrast. COMPARISON:  Prior CT from 01/26/2018. FINDINGS: Brain: 11 mm hypodensity involving the deep white matter of the posterior right centrum semi ovale, likely reflecting sequelae of subacute ischemia, as a small acute infarct was previously seen within this region on prior brain MRI (series 2, image 22). No other evidence for acute or subacute infarct. Gray-white matter differentiation otherwise maintained. No acute intracranial hemorrhage. No mass lesion, midline shift or mass effect. No hydrocephalus. No extra-axial fluid collection. Vascular: No hyperdense vessel. Skull: Scalp soft tissues and calvarium within normal limits.  Sinuses/Orbits: Globes and orbital soft tissues within normal limits. Paranasal sinuses and mastoid air cells are clear. Other: None. ASPECTS ([facility] Early CT Score) - Ganglionic level infarction (caudate, lentiform nuclei, internal capsule, insula, M1-M3 cortex): 7 - Supraganglionic infarction (M4-M6 cortex): 3 Total score (0-10 with 10 being normal): 10 IMPRESSION: 1. Approximate 1 cm hypodensity involving the deep white matter of the posterior right centrum semi ovale, likely reflecting sequelae of subacute ischemia/interval evolution of previously seen infarct at this location. 2. No other acute intracranial abnormality identified. No hemorrhage. 3. ASPECTS is 10. Critical Value/emergent results were called by telephone at the time of interpretation on 02/14/2018 at 8:08 pm to Dr. ALennice Sites, who verbally acknowledged these results. Electronically Signed   By: BPincus BadderD.  On: 02/14/2018 20:12    Chart has been reviewed    Assessment/Plan  58 y.o. female with medical history significant of metastatic  breast cancer to the brain on anastrozole, peripheral neuropathy, type 2 diabetes, hypertension, ulcerative colitis , CVA  Admitted for TIA  Present on Admission: . TIA (transient ischemic attack) -  - will admit based on TIA/CVA protocol, Initial Stroke scale 2          Other differential includes seizure spoke to need for neurology will load with Keppra and order EEG       Monitor on Tele      Defer to neurology if MRI would be warranted at this time versus if this is seizure related and patient may need EEG       CTA no significant vascular disease noted       Echo recently done 30 December showing no source of embolic disease    TSH.        Order PT/OT evaluation.                Will make sure patient is on antiplatelet ASA 81   and statin        Allow permissive Hypertension keep BP <220/120        Neurology consulted Have seen pt on arrival    .  Ulcerative colitis without complications ([place]) - stable cont mesalamin . Hypertension - allow permissive hypertension  . Brain metastasis ([place]) - currently undergoing radiation to the brain next in on Monday 02/17/2018 continue Decadron initiate Keppra for possible seizure activity . Stage IV breast cancer in female [facility]) - follow up with oncology  . Asthma - stable cont home medications.    DM 2-  - Order Sensitive SSI   -not on insulin   -  check TSH and HgA1C  - Hold by mouth medications    Other plan as per orders.  DVT prophylaxis: Eliquis  Code Status:  FULL CODE  as per patient    I had personally discussed CODE STATUS with patient and family   Family Communication:   Family  at  Bedside  plan of care was discussed with  Husband,   Disposition Plan:       To home once workup is complete and patient is stable                    Would benefit from PT/OT eval prior to [place] called:  NEurology    Admission status:  Obs    Level of care     Tele  indefinitely please discontinue once patient no longer qualifies       Braydin Aloi 02/14/2018, 9:41 PM    [facility]     after 2 AM please page floor coverage PA If 7AM-7PM, please contact the day team taking care of the patient using [email or web address]

## 2018-02-14 NOTE — Consult Note (Signed)
   [facility] Tele[facility]urology Consult Services    Date of Service:   02/14/2018 19:51:12  Impression:     .  Left hemispheric TIA vs simple partial seizure  Comments: Transient apahsia, resolved, recent stroke, known brain mets from brast Ca, on apixaban anticoagulation. Not eligible for IV tPA, deficit resolved, anticoagulants, mets and recent stroke. No LVO for NIR. Admission for supportive care and inpatient EEG  Mechanism of Stroke:  Unknown  Metrics: Last Known Well: 02/14/2018 19:15:00 [facility] Notification Time: 02/14/2018 19:51:12 Arrival Time: 02/14/2018 19:29:00 Stamp Time: 02/14/2018 19:51:12 Time First Login Attempt: 02/14/2018 19:55:00 Video Start Time: 02/14/2018 19:55:00  Symptoms: trouble speaking Patient is not a candidate for tPA. Patient was not deemed candidate for tPA thrombolytics because of Current use of NOA's. Video End Time: 02/14/2018 20:16:04  CT head showed no acute hemorrhage or acute core infarct.  Advanced imaging was reviewed, No Indication of Large Vessel Occlusive Thrombus.   Radiologist was not called back for review of advanced imaging because Self review and RAPID ED Physician notified of diagnostic impression and management plan on 02/14/2018 20:17:11  Sign Out:     .  Discussed with Emergency Department Provider    ------------------------------------------------------------------------------  History of Present Illness: Patient is a 58 year old Female.  Patient was brought by private transportation with symptoms of trouble speaking  Recent stroke December 2019, on apixaban 5mg  BID and ASA, today trouble speaking, HA earlier in the day. Improving  CT head showed no acute hemorrhage or acute core infarct.  There is history of Recent Anticoagulants. There is history of recent stroke.  Examination: BP(104/79), Blood Glucose(120) 1A: Level of Consciousness - Alert; keenly responsive + 0 1B: Ask Month and Age -  Both Questions Right + 0 1C: Blink Eyes & Squeeze Hands - Performs Both Tasks + 0 2: Test Horizontal Extraocular Movements - Forced Gaze Palsy: Cannot Be Overcome + 2 3: Test Visual Fields - No Visual Loss + 0 4: Test Facial Palsy (Use Grimace if Obtunded) - Normal symmetry + 0 5A: Test Left Arm Motor Drift - No Drift for 10 Seconds + 0 5B: Test Right Arm Motor Drift - No Drift for 10 Seconds + 0 6A: Test Left Leg Motor Drift - No Drift for 5 Seconds + 0 6B: Test Right Leg Motor Drift - No Drift for 5 Seconds + 0 7: Test Limb Ataxia (FNF/Heel-Shin) - No Ataxia + 0 8: Test Sensation - Normal; No sensory loss + 0 9: Test Language/Aphasia - Normal; No aphasia + 0 10: Test Dysarthria - Normal + 0 11: Test Extinction/Inattention - No abnormality + 0  NIHSS Score: 2  Patient was informed the Neurology Consult would happen via TeleHealth consult by way of interactive audio and video telecommunications and consented to receiving care in this manner.  Due to the immediate potential for life-threatening deterioration due to underlying acute neurologic illness, I spent 35 minutes providing critical care. This time includes time for face to face visit via telemedicine, review of medical records, imaging studies and discussion of findings with providers, the patient and/or family.   Dr Lita Mains   [facility] [phone]   Case [id number]

## 2018-02-14 NOTE — ED Notes (Signed)
ED TO INPATIENT HANDOFF REPORT  Name/Age/Gender Brandi Dickson 58 y.o. female  Code Status Code Status History    Date Active Date Inactive Code Status Order ID Comments User Context   01/26/2018 0848 01/31/2018 2106 Full Code [id number]  Shela Leff, MD ED   05/31/2017 2220 06/03/2017 1516 Full Code [id number]  Toy Baker, MD Inpatient   07/19/2016 1644 07/20/2016 1559 Full Code [id number]  Verlee Monte, MD Inpatient   06/07/2016 2150 06/09/2016 1515 Full Code [id number]  Phillips Grout, MD Inpatient   05/19/2016 0043 05/24/2016 1758 Full Code [id number]  Toy Baker, MD Inpatient   03/06/2016 1246 03/07/2016 1858 Full Code [id number]  Norman Herrlich Inpatient      Home/SNF/Other Home  Chief Complaint Stroke sympt/difficulty speaking-31minsago  Level of Care/Admitting Diagnosis ED Disposition    ED Disposition Condition [place]: Ideal [100100]  Level of Care: Cardiac Telemetry [103]  I expect the patient will be discharged within 24 hours: No (not a candidate for 5C-Observation unit)  Diagnosis: TIA (transient ischemic attack) [333545]  Admitting Physician: Toy Baker [[id number]]  Attending Physician: Toy Baker [[id number]]  Bed request comments: neurology  PT Class (Do Not Modify): Observation [104]  PT Acc Code (Do Not Modify): Observation [10022]       Medical History Past Medical History:  Diagnosis Date  . Arthritis   . Asthma    triggered with [name] perfumes and cigarette smoke  . Cancer ([place])   . Colitis   . Diabetes mellitus without complication ([place])   . History of radiation therapy 11/22/16-01/10/17   left breast 50.4 Gy in 28 fractions, axillary region 45 Gy in 25 fractions, lumpectomy cavity boost 10 Gy tin 5 fractions  . Hypertension   . Neuropathy     Allergies Allergies  Allergen Reactions  . Gadavist [Gadobutrol] Nausea And Vomiting    Severe vomiting within 1 min of  injection.   . Lisinopril Palpitations  . Augmentin [Amoxicillin-Pot Clavulanate] Other (See Comments)    sts gives her a yeast infection Has patient had a PCN reaction causing immediate rash, facial/tongue/throat swelling, SOB or lightheadedness with hypotension: no Has patient had a PCN reaction causing severe rash involving mucus membranes or skin necrosis: no Has patient had a PCN reaction that required hospitalization no Has patient had a PCN reaction occurring within the last 10 years: unknown If all of the above answers are "NO", then may proceed with Cephalosporin use.   . Benadryl [Diphenhydramine] Itching and Anxiety    Per pt: "Makes my skin crawl"; makes pt sensitive to touch  . Losartan Potassium Palpitations    IV Location/Drains/Wounds Patient Lines/Drains/Airways Status   Active Line/Drains/Airways    Name:   Placement date:   Placement time:   Site:   Days:   Peripheral IV 02/14/18 Right Antecubital   02/14/18    1944    Antecubital   less than 1   Incision (Closed) 01/31/18 Neck Left   01/31/18    1543     14          Labs/Imaging Results for orders placed or performed during the hospital encounter of 02/14/18 (from the past 48 hour(s))  Protime-INR     Status: None   Collection Time: 02/14/18  7:56 PM  Result Value Ref Range   Prothrombin Time 12.7 11.4 - 15.2 seconds   INR 0.96     Comment: Performed at [facility], [address] [place], [place] [address]  APTT     Status: None   Collection Time: 02/14/18  7:56 PM  Result Value Ref Range   aPTT 30 24 - 36 seconds    Comment: Performed at [facility], [place] [address] [place], [place] [address]  CBC     Status: Abnormal   Collection Time: 02/14/18  7:56 PM  Result Value Ref Range   WBC 10.8 (H) 4.0 - 10.5 K/uL   RBC 5.34 (H) 3.87 - 5.11 MIL/uL   Hemoglobin 14.0 12.0 - 15.0 g/dL   HCT 45.1 36.0 - 46.0 %   MCV 84.5 80.0 - 100.0 fL   MCH 26.2 26.0 - 34.0 pg    MCHC 31.0 30.0 - 36.0 g/dL   RDW 14.3 11.5 - 15.5 %   Platelets 311 150 - 400 K/uL   nRBC 0.0 0.0 - 0.2 %    Comment: Performed at [facility], [place] [address] [place], [place] [address]  Differential     Status: Abnormal   Collection Time: 02/14/18  7:56 PM  Result Value Ref Range   Neutrophils Relative % 73 %   Neutro Abs 8.0 (H) 1.7 - 7.7 K/uL   Lymphocytes Relative 14 %   Lymphs Abs 1.5 0.7 - 4.0 K/uL   Monocytes Relative 11 %   Monocytes Absolute 1.2 (H) 0.1 - 1.0 K/uL   Eosinophils Relative 1 %   Eosinophils Absolute 0.1 0.0 - 0.5 K/uL   Basophils Relative 0 %   Basophils Absolute 0.0 0.0 - 0.1 K/uL   Immature Granulocytes 1 %   Abs Immature Granulocytes 0.07 0.00 - 0.07 K/uL    Comment: Performed at [facility], [place] [address], [place], [place] [address]  Comprehensive metabolic panel     Status: Abnormal   Collection Time: 02/14/18  7:56 PM  Result Value Ref Range   Sodium 138 135 - 145 mmol/L   Potassium 3.7 3.5 - 5.1 mmol/L   Chloride 102 98 - 111 mmol/L   CO2 24 22 - 32 mmol/L   Glucose, Bld 120 (H) 70 - 99 mg/dL   BUN 21 (H) 6 - 20 mg/dL   Creatinine, Ser 0.94 0.44 - 1.00 mg/dL   Calcium 9.0 8.9 - 10.3 mg/dL   Total Protein 7.4 6.5 - 8.1 g/dL   Albumin 3.7 3.5 - 5.0 g/dL   AST 27 15 - 41 U/L   ALT 24 0 - 44 U/L   Alkaline Phosphatase 100 38 - 126 U/L   Total Bilirubin 1.1 0.3 - 1.2 mg/dL   GFR calc non Af Amer >60 >60 mL/min   GFR calc Af Amer >60 >60 mL/min   Anion gap 12 5 - 15    Comment: Performed at [facility], [place] [address] [place], [place] [address]  I-stat troponin, ED     Status: None   Collection Time: 02/14/18  8:01 PM  Result Value Ref Range   Troponin i, poc 0.01 0.00 - 0.08 ng/mL   Comment 3            Comment: Due to the release kinetics of cTnI, a negative result within the first hours of the onset of symptoms does not rule out myocardial infarction with certainty. If  myocardial infarction is still suspected, repeat the test at appropriate intervals.   I-Stat beta hCG blood, ED     Status: Abnormal   Collection Time: 02/14/18  8:02  PM  Result Value Ref Range   I-stat hCG, quantitative 6.7 (H) <5 mIU/mL   Comment 3            Comment:   GEST. AGE      CONC.  (mIU/mL)   <=1 WEEK        5 - 50     2 WEEKS       50 - 500     3 WEEKS       100 - 10,000     4 WEEKS     1,000 - 30,000        FEMALE AND NON-PREGNANT FEMALE:     LESS THAN 5 mIU/mL   I-Stat Chem 8, ED     Status: Abnormal   Collection Time: 02/14/18  8:05 PM  Result Value Ref Range   Sodium 137 135 - 145 mmol/L   Potassium 3.8 3.5 - 5.1 mmol/L   Chloride 102 98 - 111 mmol/L   BUN 19 6 - 20 mg/dL   Creatinine, Ser 0.80 0.44 - 1.00 mg/dL   Glucose, Bld 120 (H) 70 - 99 mg/dL   Calcium, Ion 1.14 (L) 1.15 - 1.40 mmol/L   TCO2 25 22 - 32 mmol/L   Hemoglobin 15.6 (H) 12.0 - 15.0 g/dL   HCT 46.0 36.0 - 46.0 %   Ct Angio Head W Or Wo Contrast  Result Date: 02/14/2018 CLINICAL DATA:  Stroke.  Aphasia EXAM: CT ANGIOGRAPHY HEAD AND NECK TECHNIQUE: Multidetector CT imaging of the head and neck was performed using the standard protocol during bolus administration of intravenous contrast. Multiplanar CT image reconstructions and MIPs were obtained to evaluate the vascular anatomy. Carotid stenosis measurements (when applicable) are obtained utilizing NASCET criteria, using the distal internal carotid diameter as the denominator. CONTRAST:  165mL ISOVUE-370 IOPAMIDOL (ISOVUE-370) INJECTION 76% COMPARISON:  CT head 02/14/2018 FINDINGS: CTA NECK FINDINGS Aortic arch: Standard branching. Imaged portion shows no evidence of aneurysm or dissection. No significant stenosis of the major arch vessel origins. Right carotid system: Negative for stenosis or atherosclerotic disease Left carotid system: Negative for stenosis or atherosclerotic disease Vertebral arteries: Both vertebral arteries widely patent without  stenosis. Skeleton: Cervical spondylosis.  No acute skeletal abnormality. Other neck: Negative for mass or adenopathy. Upper chest: Moderately large right effusion. Peri carina adenopathy. Anterior mediastinal adenopathy. Review of the MIP images confirms the above findings CTA HEAD FINDINGS Anterior circulation: Cavernous carotid widely patent bilaterally. Anterior and middle cerebral arteries widely patent bilaterally. Posterior circulation: Both vertebral arteries patent to the basilar. PICA patent bilaterally. Superior cerebellar and posterior cerebral arteries widely patent bilaterally. Fetal origin left posterior cerebral artery. Venous sinuses: Patent Anatomic variants: None Delayed phase: Normal enhancement on delayed imaging. Hypodensity in the corona radiata on the right unchanged. Review of the MIP images confirms the above findings IMPRESSION: 1. Negative for emergent large vessel occlusion. 2. No significant carotid or vertebral artery stenosis. No significant intracranial stenosis. Electronically Signed   By: Franchot Gallo M.D.   On: 02/14/2018 20:20   Dg Chest 2 View  Result Date: 02/14/2018 CLINICAL DATA:  58 year old female with acute onset difficulty speaking. Breast cancer. Multiple small embolic brain infarcts last month. EXAM: CHEST - 2 VIEW COMPARISON:  CTA head and neck earlier today. Portable chest 01/30/2018. FINDINGS: Moderate right pleural effusion appears mildly increased from earlier this month. Associated right lung base opacification. Mediastinal contours remain within normal limits. No pneumothorax or pulmonary edema. The left lung appears clear. No  acute osseous abnormality identified. Negative visible bowel gas pattern. IMPRESSION: Moderate right pleural effusion appears increased from earlier this month. Electronically Signed   By: Genevie Ann M.D.   On: 02/14/2018 21:39   Ct Angio Neck W And/or Wo Contrast  Result Date: 02/14/2018 CLINICAL DATA:  Stroke.  Aphasia EXAM: CT  ANGIOGRAPHY HEAD AND NECK TECHNIQUE: Multidetector CT imaging of the head and neck was performed using the standard protocol during bolus administration of intravenous contrast. Multiplanar CT image reconstructions and MIPs were obtained to evaluate the vascular anatomy. Carotid stenosis measurements (when applicable) are obtained utilizing NASCET criteria, using the distal internal carotid diameter as the denominator. CONTRAST:  155mL ISOVUE-370 IOPAMIDOL (ISOVUE-370) INJECTION 76% COMPARISON:  CT head 02/14/2018 FINDINGS: CTA NECK FINDINGS Aortic arch: Standard branching. Imaged portion shows no evidence of aneurysm or dissection. No significant stenosis of the major arch vessel origins. Right carotid system: Negative for stenosis or atherosclerotic disease Left carotid system: Negative for stenosis or atherosclerotic disease Vertebral arteries: Both vertebral arteries widely patent without stenosis. Skeleton: Cervical spondylosis.  No acute skeletal abnormality. Other neck: Negative for mass or adenopathy. Upper chest: Moderately large right effusion. Peri carina adenopathy. Anterior mediastinal adenopathy. Review of the MIP images confirms the above findings CTA HEAD FINDINGS Anterior circulation: Cavernous carotid widely patent bilaterally. Anterior and middle cerebral arteries widely patent bilaterally. Posterior circulation: Both vertebral arteries patent to the basilar. PICA patent bilaterally. Superior cerebellar and posterior cerebral arteries widely patent bilaterally. Fetal origin left posterior cerebral artery. Venous sinuses: Patent Anatomic variants: None Delayed phase: Normal enhancement on delayed imaging. Hypodensity in the corona radiata on the right unchanged. Review of the MIP images confirms the above findings IMPRESSION: 1. Negative for emergent large vessel occlusion. 2. No significant carotid or vertebral artery stenosis. No significant intracranial stenosis. Electronically Signed   By:  Franchot Gallo M.D.   On: 02/14/2018 20:20   Ct Head Code Stroke Wo Contrast  Result Date: 02/14/2018 CLINICAL DATA:  Code stroke. Initial evaluation for acute speech difficulty. EXAM: CT HEAD WITHOUT CONTRAST TECHNIQUE: Contiguous axial images were obtained from the base of the skull through the vertex without intravenous contrast. COMPARISON:  Prior CT from 01/26/2018. FINDINGS: Brain: 11 mm hypodensity involving the deep white matter of the posterior right centrum semi ovale, likely reflecting sequelae of subacute ischemia, as a small acute infarct was previously seen within this region on prior brain MRI (series 2, image 22). No other evidence for acute or subacute infarct. Gray-white matter differentiation otherwise maintained. No acute intracranial hemorrhage. No mass lesion, midline shift or mass effect. No hydrocephalus. No extra-axial fluid collection. Vascular: No hyperdense vessel. Skull: Scalp soft tissues and calvarium within normal limits. Sinuses/Orbits: Globes and orbital soft tissues within normal limits. Paranasal sinuses and mastoid air cells are clear. Other: None. ASPECTS [facility] Stroke Program Early CT Score) - Ganglionic level infarction (caudate, lentiform nuclei, internal capsule, insula, M1-M3 cortex): 7 - Supraganglionic infarction (M4-M6 cortex): 3 Total score (0-10 with 10 being normal): 10 IMPRESSION: 1. Approximate 1 cm hypodensity involving the deep white matter of the posterior right centrum semi ovale, likely reflecting sequelae of subacute ischemia/interval evolution of previously seen infarct at this location. 2. No other acute intracranial abnormality identified. No hemorrhage. 3. ASPECTS is 10. Critical Value/emergent results were called by telephone at the time of interpretation on 02/14/2018 at 8:08 pm to Dr. Lennice Sites , who verbally acknowledged these results. Electronically Signed   By: Pincus Badder.D.  On: 02/14/2018 20:12   None  Pending  Labs Unresulted Labs (From admission, onward)    Start     Ordered   02/14/18 1944  Urinalysis, Routine w reflex microscopic  Once,   R     02/14/18 1943   Signed and Held  Troponin I - Add-On to previous collection  Add-on,   R     Signed and Held   Signed and Held  Hemoglobin A1c  Tomorrow morning,   R     Signed and Held   Signed and Held  Lipid panel  Tomorrow morning,   R    Comments:  Fasting    Signed and Held   Signed and Held  Urinalysis, dipstick only  Once,   R    Comments:  On admission    Signed and Held          Vitals/Pain Today's Vitals   02/14/18 2000 02/14/18 2020 02/14/18 2045 02/14/18 2115  BP: 104/79 115/77  115/87  Pulse: 92 90 88 84  Resp: 15 20 20 19   Temp:      TempSrc:      SpO2: 99% 100% 100% 98%  Weight:      Height:      PainSc:        Isolation Precautions No active isolations  Medications Medications  sodium chloride flush (NS) 0.9 % injection 3 mL (has no administration in time range)  iopamidol (ISOVUE-370) 76 % injection 100 mL (100 mLs Intravenous Contrast Given 02/14/18 1959)    Mobility walks with person assist

## 2018-02-14 NOTE — ED Notes (Signed)
Requested patient to urinate. 

## 2018-02-14 NOTE — Progress Notes (Signed)
DISCONTINUE SELECTED CLINICAL TRIAL - Breast  Trial: NRG BR004  **Trial eligibility and accrual should be confirmed by your research team**  REASON: Other Reason PRIOR TREATMENT: Trial: NRG BR004 TREATMENT RESPONSE: Unable to Evaluate  START ON PATHWAY REGIMEN - Breast     A cycle is every 21 days:     Ado-trastuzumab emtansine   **Always confirm dose/schedule in your pharmacy ordering system**  Patient Characteristics: Distant Metastases or Locoregional Recurrent Disease - Unresected or Locally Advanced Unresectable Disease Progressing after Neoadjuvant and Local Therapies, HER2 Positive, ER Negative/Unknown, Chemotherapy, Second Line Therapeutic Status: Distant Metastases BRCA Mutation Status: Absent ER Status: Negative (-) HER2 Status: Positive (+) PR Status: Negative (-) Line of Therapy: Second Line Intent of Therapy: Non-Curative / Palliative Intent, Discussed with Patient 

## 2018-02-14 NOTE — ED Notes (Signed)
[facility] took patient to [facility]

## 2018-02-14 NOTE — Progress Notes (Signed)
CLINICAL TRIAL SELECTED - Breast  No Change  Continue With Treatment as Ordered.  Trial: NRG BR004  **Trial eligibility and accrual should be confirmed by your research team**  Patient Characteristics: Distant Metastases or Locoregional Recurrent Disease - Unresected or Locally Advanced Unresectable Disease Progressing after Neoadjuvant and Local Therapies, HER2 Positive, ER Negative/Unknown, Chemotherapy, First Line Therapeutic Status: Distant Metastases BRCA Mutation Status: Absent ER Status: Negative (-) HER2 Status: Positive (+) PR Status: Negative (-) Line of Therapy: First Line Intent of Therapy: Non-Curative / Palliative Intent, Discussed with Patient

## 2018-02-15 ENCOUNTER — Observation Stay (HOSPITAL_COMMUNITY): Payer: 59

## 2018-02-15 DIAGNOSIS — J90 Pleural effusion, not elsewhere classified: Secondary | ICD-10-CM | POA: Diagnosis not present

## 2018-02-15 DIAGNOSIS — Z8673 Personal history of transient ischemic attack (TIA), and cerebral infarction without residual deficits: Secondary | ICD-10-CM | POA: Diagnosis not present

## 2018-02-15 DIAGNOSIS — K5190 Ulcerative colitis, unspecified, without complications: Secondary | ICD-10-CM | POA: Diagnosis not present

## 2018-02-15 DIAGNOSIS — Z7901 Long term (current) use of anticoagulants: Secondary | ICD-10-CM | POA: Diagnosis not present

## 2018-02-15 DIAGNOSIS — C50919 Malignant neoplasm of unspecified site of unspecified female breast: Secondary | ICD-10-CM | POA: Diagnosis not present

## 2018-02-15 DIAGNOSIS — Z803 Family history of malignant neoplasm of breast: Secondary | ICD-10-CM | POA: Diagnosis not present

## 2018-02-15 DIAGNOSIS — E1169 Type 2 diabetes mellitus with other specified complication: Secondary | ICD-10-CM

## 2018-02-15 DIAGNOSIS — C787 Secondary malignant neoplasm of liver and intrahepatic bile duct: Secondary | ICD-10-CM | POA: Diagnosis not present

## 2018-02-15 DIAGNOSIS — F802 Mixed receptive-expressive language disorder: Secondary | ICD-10-CM | POA: Diagnosis not present

## 2018-02-15 DIAGNOSIS — Z7982 Long term (current) use of aspirin: Secondary | ICD-10-CM | POA: Diagnosis not present

## 2018-02-15 DIAGNOSIS — G459 Transient cerebral ischemic attack, unspecified: Secondary | ICD-10-CM

## 2018-02-15 DIAGNOSIS — Z79899 Other long term (current) drug therapy: Secondary | ICD-10-CM | POA: Diagnosis not present

## 2018-02-15 DIAGNOSIS — E1140 Type 2 diabetes mellitus with diabetic neuropathy, unspecified: Secondary | ICD-10-CM | POA: Diagnosis not present

## 2018-02-15 DIAGNOSIS — C7800 Secondary malignant neoplasm of unspecified lung: Secondary | ICD-10-CM | POA: Diagnosis not present

## 2018-02-15 DIAGNOSIS — Z808 Family history of malignant neoplasm of other organs or systems: Secondary | ICD-10-CM | POA: Diagnosis not present

## 2018-02-15 DIAGNOSIS — R2990 Unspecified symptoms and signs involving the nervous system: Secondary | ICD-10-CM

## 2018-02-15 DIAGNOSIS — R29702 NIHSS score 2: Secondary | ICD-10-CM | POA: Diagnosis not present

## 2018-02-15 DIAGNOSIS — E663 Overweight: Secondary | ICD-10-CM | POA: Diagnosis present

## 2018-02-15 DIAGNOSIS — I639 Cerebral infarction, unspecified: Secondary | ICD-10-CM | POA: Diagnosis present

## 2018-02-15 DIAGNOSIS — Z90710 Acquired absence of both cervix and uterus: Secondary | ICD-10-CM | POA: Diagnosis not present

## 2018-02-15 DIAGNOSIS — Z96643 Presence of artificial hip joint, bilateral: Secondary | ICD-10-CM | POA: Diagnosis present

## 2018-02-15 DIAGNOSIS — Z7984 Long term (current) use of oral hypoglycemic drugs: Secondary | ICD-10-CM | POA: Diagnosis not present

## 2018-02-15 DIAGNOSIS — I10 Essential (primary) hypertension: Secondary | ICD-10-CM | POA: Diagnosis not present

## 2018-02-15 DIAGNOSIS — R270 Ataxia, unspecified: Secondary | ICD-10-CM | POA: Diagnosis not present

## 2018-02-15 DIAGNOSIS — E785 Hyperlipidemia, unspecified: Secondary | ICD-10-CM | POA: Diagnosis present

## 2018-02-15 DIAGNOSIS — E119 Type 2 diabetes mellitus without complications: Secondary | ICD-10-CM | POA: Diagnosis not present

## 2018-02-15 DIAGNOSIS — C7931 Secondary malignant neoplasm of brain: Secondary | ICD-10-CM | POA: Diagnosis not present

## 2018-02-15 DIAGNOSIS — R451 Restlessness and agitation: Secondary | ICD-10-CM | POA: Diagnosis not present

## 2018-02-15 DIAGNOSIS — G629 Polyneuropathy, unspecified: Secondary | ICD-10-CM | POA: Diagnosis not present

## 2018-02-15 DIAGNOSIS — C50412 Malignant neoplasm of upper-outer quadrant of left female breast: Secondary | ICD-10-CM | POA: Diagnosis not present

## 2018-02-15 LAB — URINALYSIS, DIPSTICK ONLY
BILIRUBIN URINE: NEGATIVE
Glucose, UA: NEGATIVE mg/dL
Hgb urine dipstick: NEGATIVE
Ketones, ur: NEGATIVE mg/dL
Leukocytes, UA: NEGATIVE
Nitrite: NEGATIVE
Protein, ur: NEGATIVE mg/dL
Specific Gravity, Urine: 1.015 (ref 1.005–1.030)
pH: 7 (ref 5.0–8.0)

## 2018-02-15 LAB — HEMOGLOBIN A1C
Hgb A1c MFr Bld: 6 % — ABNORMAL HIGH (ref 4.8–5.6)
Mean Plasma Glucose: 125.5 mg/dL

## 2018-02-15 LAB — LIPID PANEL
Cholesterol: 177 mg/dL (ref 0–200)
HDL: 51 mg/dL (ref >40)
LDL CALC: 107 mg/dL — AB (ref 0–99)
Total CHOL/HDL Ratio: 3.5 RATIO
Triglycerides: 93 mg/dL (ref <150)
VLDL: 19 mg/dL (ref 0–40)

## 2018-02-15 LAB — GLUCOSE, CAPILLARY
Glucose-Capillary: 106 mg/dL — ABNORMAL HIGH (ref 70–99)
Glucose-Capillary: 156 mg/dL — ABNORMAL HIGH (ref 70–99)

## 2018-02-15 LAB — TROPONIN I: Troponin I: 0.03 ng/mL (ref <0.03)

## 2018-02-15 MED ORDER — ENOXAPARIN SODIUM 80 MG/0.8ML ~~LOC~~ SOLN
1.0000 mg/kg | Freq: Two times a day (BID) | SUBCUTANEOUS | Status: DC
  Administered 2018-02-15 – 2018-02-16 (×2): 80 mg via SUBCUTANEOUS
  Filled 2018-02-15 (×2): qty 0.8

## 2018-02-15 MED ORDER — LORAZEPAM 2 MG/ML IJ SOLN
0.5000 mg | Freq: Once | INTRAMUSCULAR | Status: AC | PRN
  Administered 2018-02-15: 0.5 mg via INTRAVENOUS
  Filled 2018-02-15: qty 1

## 2018-02-15 MED ORDER — ONDANSETRON HCL 4 MG/2ML IJ SOLN: 4.0000 mg | Freq: Four times a day (QID) | INTRAMUSCULAR | Status: DC | PRN

## 2018-02-15 MED ORDER — GADOBUTROL 1 MMOL/ML IV SOLN
7.5000 mL | Freq: Once | INTRAVENOUS | Status: AC | PRN
  Administered 2018-02-15: 7.5 mL via INTRAVENOUS

## 2018-02-15 NOTE — Progress Notes (Signed)
EEG completed; results pending.    

## 2018-02-15 NOTE — Evaluation (Addendum)
Occupational Therapy Evaluation Patient Details Name: Brandi Dickson MRN: [id number] DOB: November 23, 1960 Today's Date: 02/15/2018    History of Present Illness Pt is a 58 y/o F who presents with difficulty speaking and word substitutions. MRI showing multiple enhancing metastatic deposits throughotu the brain and cerebellum. Most lesions are stable since prior study but left parietal cortical lesion has progressed and measures 4 mm in diameter. Multiple new areas of restricted diffusion are seen throughout the cerebral hemispheres and cerebellum bilaterally suggestive of acute infarct. PMH: breast CA, B THA, arthritis, HTN. CT 12/31 + Interval development of metastatic disease including mediastinal and right hilar adenopathy, B pulmonary nodlules, R pleural effusion with suggestion of peritoneal nodularity, hepatic metastatic disease and splenic metastatic desease.    Clinical Impression   This 58 yo female admitted with above presents to acute OT with decreased balance and diplopia all affecting her safety and independence with basic ADLs. She will benefit from acute OT with follow up OPOT and 24 hour/S until husband feels she is safe enough to leave alone.    Follow Up Recommendations  Outpatient OT;Supervision/Assistance - 24 hour    Equipment Recommendations  None recommended by OT       Precautions / Restrictions Precautions Precautions: Fall Precaution Comments: diplopia (has taped glasses) Restrictions Weight Bearing Restrictions: No      Mobility Bed Mobility Overal bed mobility: Needs Assistance Bed Mobility: Supine to Sit     Supine to sit: Supervision        Transfers Overall transfer level: Needs assistance Equipment used: None Transfers: Sit to/from Stand Sit to Stand: Min guard         General transfer comment: Ambulation min A    Balance Overall balance assessment: Needs assistance Sitting-balance support: No upper extremity supported;Feet  supported Sitting balance-Leahy Scale: Good     Standing balance support: No upper extremity supported Standing balance-Leahy Scale: Poor Standing balance comment: statically fair; dynamically poor                           ADL either performed or assessed with clinical judgement   ADL Overall ADL's : Needs assistance/impaired Eating/Feeding: Independent;Sitting   Grooming: Set up;Supervision/safety;Sitting   Upper Body Bathing: Supervision/ safety;Set up;Sitting   Lower Body Bathing: Set up;Supervison/ safety Lower Body Bathing Details (indicate cue type and reason): min guard A sit<>stand Upper Body Dressing : Supervision/safety;Set up;Sitting   Lower Body Dressing: Supervision/safety;Set up Lower Body Dressing Details (indicate cue type and reason): min guard A sit<>stand Toilet Transfer: Minimal assistance;Ambulation Toilet Transfer Details (indicate cue type and reason): No AD Toileting- Clothing Manipulation and Hygiene: Min guard;Sit to/from stand         General ADL Comments: Pt quite unsteady when up on her feet (especially with turns--may be due to Ativan given earlier). Gave pt's husband gait belt and recommended that he use it and be with her anytime she is up on her feet due to balance issues encountered this session     Vision Baseline Vision/History: Wears glasses Wears Glasses: Reading only Patient Visual Report: Blurring of vision;Diplopia Diplopia Assessment: Disappears with one eye closed;Present in primary gaze;Present in far gaze Additional Comments: Pt could converge eyes ~10 inches away to get one object only without taped glasses on            Pertinent Vitals/Pain Pain Assessment: No/denies pain     Hand Dominance Right   Extremity/Trunk Assessment Upper Extremity Assessment Upper  Extremity Assessment: Overall WFL for tasks assessed           Communication Communication Communication: No difficulties   Cognition  Arousal/Alertness: Awake/alert Behavior During Therapy: (mostly WFL for tasks assesed, but was getting agitated due to not being able to do what she wanted to on her phone (she was trying to show me a picture of her cats)) Overall Cognitive Status: Impaired/Different from baseline Area of Impairment: Safety/judgement;Problem solving                         Safety/Judgement: Decreased awareness of safety;Decreased awareness of deficits   Problem Solving: Difficulty sequencing;Requires tactile cues;Requires verbal cues                Home Living Family/patient expects to be discharged to:: Private residence Living Arrangements: Spouse/significant other Available Help at Discharge: Family;Available PRN/intermittently Type of Home: House Home Access: Stairs to enter [employer] of Steps: 4 Entrance Stairs-Rails: Right Home Layout: One level     Bathroom Shower/Tub: Teacher, early years/pre: Handicapped height     Home Equipment: Cane - single point;Walker - 2 wheels;Crutches;Wheelchair - [employer];Shower seat   Additional Comments: husband works      Prior Functioning/Environment Level of Independence: Engineer, materials / Transfers Assistance Needed: intermittent S ADL's / Homemaking Assistance Needed: intermittent S Communication / Swallowing Assistance Needed: Per husband she was having trouble using her phone, home phone and TV remote since last admission Comments: working, driving, enjoys traveling, has 3 cats and 2 dogs (up until last Dec when she had first CVA)        OT Problem List: Impaired balance (sitting and/or standing);Impaired vision/perception;Decreased safety awareness      OT Treatment/Interventions: Self-care/ADL training;Visual/perceptual remediation/compensation;Balance training;Patient/family education    OT Goals(Current goals can be found in the care plan section) Acute Rehab OT Goals Patient  Stated Goal: to go home  OT Goal Formulation: With patient/family Time For Goal Achievement: 03/01/18 Potential to Achieve Goals: Good  OT Frequency: Min 3X/week              AM-PAC OT "6 Clicks" Daily Activity     Outcome Measure Help from another person eating meals?: None Help from another person taking care of personal grooming?: A Little Help from another person toileting, which includes using toliet, bedpan, or urinal?: A Little Help from another person bathing (including washing, rinsing, drying)?: A Little Help from another person to put on and taking off regular upper body clothing?: A Little Help from another person to put on and taking off regular lower body clothing?: A Little 6 Click Score: 19   End of Session Equipment Utilized During Treatment: Gait belt Nurse Communication: Mobility status  Activity Tolerance: Patient tolerated treatment well Patient left: in chair;with call bell/phone within reach;with chair alarm set  OT Visit Diagnosis: Unsteadiness on feet (R26.81);Other (comment)(double vision)                Time: 9417-4081 OT Time Calculation (min): 51 min Charges:  OT General Charges $OT Visit: 1 Visit OT Evaluation $OT Eval Moderate Complexity: 1 Mod OT Treatments $Self Care/Home Management : 23-37 mins  Golden Circle, OTR/L Acute [employer] Pager [phone] Office [phone]    Almon Register 02/15/2018, 2:10 PM

## 2018-02-15 NOTE — Progress Notes (Signed)
OT Cancellation Note  Patient Details Name: Brandi Dickson MRN: [id number] DOB: 05-Nov-1960   Cancelled Treatment:    Reason Eval/Treat Not Completed: Patient at procedure or test/ unavailable. Pt currently of floor for MRI.  Golden Circle, OTR/L Acute Rehab Services Pager [phone] Office [phone]     Almon Register 02/15/2018, 11:22 AM

## 2018-02-15 NOTE — Progress Notes (Signed)
Patient irritable about being awaken for neuro exams during the night.Patient states,"I'm tired and need to sleep.I don't need you coming in here and waking me up." This nurse explained to patient that she is being monitored for neurological changes and doctor wants this done every two hours and to report changes.Patient stated," I don't believe that's necessary."Will notify charge nurse Phyl and continue to monitor patient as she allows.

## 2018-02-15 NOTE — Progress Notes (Signed)
[facility] PROGRESS NOTE  Brandi Dickson QQI:[id number] DOB: April 18, 1960 DOA: 02/14/2018 PCP: Orpah Melter, MD  Assessment/Plan:  TIA (transient ischemic attack) - hx metastatic brain cancer presented with aphasia, word salad. CT reveals likely sequela of subacute ischemic/interval evolution of previously seen infarct. Initial Stroke scale 2. Evaluated by neurology who opine other differential includes seizure. Recommended MRI and EEG as well as Keppra.  CTA no significant vascular disease noted. Echo recently done 30 December showing no source of embolic disease. -TSH- - Order PT/OT evaluation.  -  continue current anticoagulation per neurology -follow MRI results -keppra per neuro -await further recommendations per stroke team         . Ulcerative colitis without complications ([place]) - stable cont mesalamin  . Hypertension - controlled. Home meds include metoprolol. -hold metoprolol to allow permissive hypertension -monitor   . Brain metastasis ([place]) - currently undergoing radiation to the brain next in on Monday 02/17/2018 - continue Decadron -continue Keppra for possible seizure activity  . Stage IV breast cancer in female [facility]) - follow up with oncology .  [name] Kitchen Asthma - stable cont home medications.   DM 2-  - controled. Home meds include prednisone. A1c 6.0 Not on insulin   -  check TSH    Code Status: full Family Communication: none present Disposition Plan: home hopefully tomorrow   Consultants:  [facility]   Procedures:  eeg  Antibiotics:    HPI/Subjective: Brandi Dickson is a 58 y.o. female past medical history of metastatic breast cancer with mets to the brain, lungs, recent stroke in December after which she is started on apixaban and aspirin, presented to the emergency room 02/14/18 for evaluation of difficulty speaking and word substitutions. CT concerning for subacute ischemia/interval evolution of the previously seen infart  Patient  alert oriented x3 this am. Continues with aphagia and word difficulty. Denies pain/discomfort  Objective: Vitals:   02/15/18 0555 02/15/18 0809  BP: 110/78 (!) 111/58  Pulse:  (!) 102  Resp: 18 13  Temp: 98 F (36.7 C) 97.6 F (36.4 C)  SpO2: 95% 100%    Intake/Output Summary (Last 24 hours) at 02/15/2018 1229 Last data filed at 02/15/2018 0600 Gross per 24 hour  Intake 175.79 ml  Output 600 ml  Net -424.21 ml   Filed Weights   02/14/18 1949 02/14/18 2318  Weight: 74.8 kg 77.7 kg    Exam:   General:  Awake alert oriented x3.   Cardiovascular: rrr no mgr no LE edema  Respiratory: normal effort respirations slightly shallow. No crackles no wheezes  Abdomen: non-distended soft +BS no guarding or rebounding  Musculoskeletal: joints without swelling/erythema   Neuro: alert speech clear but word searching evident. Bilateral grip 5/5 tongue midline   Data Reviewed: Basic Metabolic Panel: Recent Labs  Lab 02/14/18 1956 02/14/18 2005  NA 138 137  K 3.7 3.8  CL 102 102  CO2 24  --   GLUCOSE 120* 120*  BUN 21* 19  CREATININE 0.94 0.80  CALCIUM 9.0  --    Liver Function Tests: Recent Labs  Lab 02/14/18 1956  AST 27  ALT 24  ALKPHOS 100  BILITOT 1.1  PROT 7.4  ALBUMIN 3.7   No results for input(s): LIPASE, AMYLASE in the last 168 hours. No results for input(s): AMMONIA in the last 168 hours. CBC: Recent Labs  Lab 02/14/18 1956 02/14/18 2005  WBC 10.8*  --   NEUTROABS 8.0*  --   HGB 14.0 15.6*  HCT  45.1 46.0  MCV 84.5  --   PLT 311  --    Cardiac Enzymes: Recent Labs  Lab 02/15/18 0650  TROPONINI 0.03*   BNP (last 3 results) No results for input(s): BNP in the last 8760 hours.  ProBNP (last 3 results) No results for input(s): PROBNP in the last 8760 hours.  CBG: Recent Labs  Lab 02/15/18 0616  GLUCAP 106*    No results found for this or any previous visit (from the past 240 hour(s)).   Studies: Ct Angio Head W Or Wo  Contrast  Result Date: 02/14/2018 CLINICAL DATA:  Stroke.  Aphasia EXAM: CT ANGIOGRAPHY HEAD AND NECK TECHNIQUE: Multidetector CT imaging of the head and neck was performed using the standard protocol during bolus administration of intravenous contrast. Multiplanar CT image reconstructions and MIPs were obtained to evaluate the vascular anatomy. Carotid stenosis measurements (when applicable) are obtained utilizing NASCET criteria, using the distal internal carotid diameter as the denominator. CONTRAST:  125mL ISOVUE-370 IOPAMIDOL (ISOVUE-370) INJECTION 76% COMPARISON:  CT head 02/14/2018 FINDINGS: CTA NECK FINDINGS Aortic arch: Standard branching. Imaged portion shows no evidence of aneurysm or dissection. No significant stenosis of the major arch vessel origins. Right carotid system: Negative for stenosis or atherosclerotic disease Left carotid system: Negative for stenosis or atherosclerotic disease Vertebral arteries: Both vertebral arteries widely patent without stenosis. Skeleton: Cervical spondylosis.  No acute skeletal abnormality. Other neck: Negative for mass or adenopathy. Upper chest: Moderately large right effusion. Peri carina adenopathy. Anterior mediastinal adenopathy. Review of the MIP images confirms the above findings CTA HEAD FINDINGS Anterior circulation: Cavernous carotid widely patent bilaterally. Anterior and middle cerebral arteries widely patent bilaterally. Posterior circulation: Both vertebral arteries patent to the basilar. PICA patent bilaterally. Superior cerebellar and posterior cerebral arteries widely patent bilaterally. Fetal origin left posterior cerebral artery. Venous sinuses: Patent Anatomic variants: None Delayed phase: Normal enhancement on delayed imaging. Hypodensity in the corona radiata on the right unchanged. Review of the MIP images confirms the above findings IMPRESSION: 1. Negative for emergent large vessel occlusion. 2. No significant carotid or vertebral artery  stenosis. No significant intracranial stenosis. Electronically Signed   By: Franchot Gallo M.D.   On: 02/14/2018 20:20   Dg Chest 2 View  Result Date: 02/14/2018 CLINICAL DATA:  58 year old female with acute onset difficulty speaking. Breast cancer. Multiple small embolic brain infarcts last month. EXAM: CHEST - 2 VIEW COMPARISON:  CTA head and neck earlier today. Portable chest 01/30/2018. FINDINGS: Moderate right pleural effusion appears mildly increased from earlier this month. Associated right lung base opacification. Mediastinal contours remain within normal limits. No pneumothorax or pulmonary edema. The left lung appears clear. No acute osseous abnormality identified. Negative visible bowel gas pattern. IMPRESSION: Moderate right pleural effusion appears increased from earlier this month. Electronically Signed   By: Genevie Ann M.D.   On: 02/14/2018 21:39   Ct Angio Neck W And/or Wo Contrast  Result Date: 02/14/2018 CLINICAL DATA:  Stroke.  Aphasia EXAM: CT ANGIOGRAPHY HEAD AND NECK TECHNIQUE: Multidetector CT imaging of the head and neck was performed using the standard protocol during bolus administration of intravenous contrast. Multiplanar CT image reconstructions and MIPs were obtained to evaluate the vascular anatomy. Carotid stenosis measurements (when applicable) are obtained utilizing NASCET criteria, using the distal internal carotid diameter as the denominator. CONTRAST:  122mL ISOVUE-370 IOPAMIDOL (ISOVUE-370) INJECTION 76% COMPARISON:  CT head 02/14/2018 FINDINGS: CTA NECK FINDINGS Aortic arch: Standard branching. Imaged portion shows no evidence of aneurysm or dissection.  No significant stenosis of the major arch vessel origins. Right carotid system: Negative for stenosis or atherosclerotic disease Left carotid system: Negative for stenosis or atherosclerotic disease Vertebral arteries: Both vertebral arteries widely patent without stenosis. Skeleton: Cervical spondylosis.  No acute skeletal  abnormality. Other neck: Negative for mass or adenopathy. Upper chest: Moderately large right effusion. Peri carina adenopathy. Anterior mediastinal adenopathy. Review of the MIP images confirms the above findings CTA HEAD FINDINGS Anterior circulation: Cavernous carotid widely patent bilaterally. Anterior and middle cerebral arteries widely patent bilaterally. Posterior circulation: Both vertebral arteries patent to the basilar. PICA patent bilaterally. Superior cerebellar and posterior cerebral arteries widely patent bilaterally. Fetal origin left posterior cerebral artery. Venous sinuses: Patent Anatomic variants: None Delayed phase: Normal enhancement on delayed imaging. Hypodensity in the corona radiata on the right unchanged. Review of the MIP images confirms the above findings IMPRESSION: 1. Negative for emergent large vessel occlusion. 2. No significant carotid or vertebral artery stenosis. No significant intracranial stenosis. Electronically Signed   By: Franchot Gallo M.D.   On: 02/14/2018 20:20   Ct Head Code Stroke Wo Contrast  Result Date: 02/14/2018 CLINICAL DATA:  Code stroke. Initial evaluation for acute speech difficulty. EXAM: CT HEAD WITHOUT CONTRAST TECHNIQUE: Contiguous axial images were obtained from the base of the skull through the vertex without intravenous contrast. COMPARISON:  Prior CT from 01/26/2018. FINDINGS: Brain: 11 mm hypodensity involving the deep white matter of the posterior right centrum semi ovale, likely reflecting sequelae of subacute ischemia, as a small acute infarct was previously seen within this region on prior brain MRI (series 2, image 22). No other evidence for acute or subacute infarct. Gray-white matter differentiation otherwise maintained. No acute intracranial hemorrhage. No mass lesion, midline shift or mass effect. No hydrocephalus. No extra-axial fluid collection. Vascular: No hyperdense vessel. Skull: Scalp soft tissues and calvarium within normal limits.  Sinuses/Orbits: Globes and orbital soft tissues within normal limits. Paranasal sinuses and mastoid air cells are clear. Other: None. ASPECTS [facility] Stroke Program Early CT Score) - Ganglionic level infarction (caudate, lentiform nuclei, internal capsule, insula, M1-M3 cortex): 7 - Supraganglionic infarction (M4-M6 cortex): 3 Total score (0-10 with 10 being normal): 10 IMPRESSION: 1. Approximate 1 cm hypodensity involving the deep white matter of the posterior right centrum semi ovale, likely reflecting sequelae of subacute ischemia/interval evolution of previously seen infarct at this location. 2. No other acute intracranial abnormality identified. No hemorrhage. 3. ASPECTS is 10. Critical Value/emergent results were called by telephone at the time of interpretation on 02/14/2018 at 8:08 pm to Dr. Lennice Sites , who verbally acknowledged these results. Electronically Signed   By: Jeannine Boga M.D.   On: 02/14/2018 20:12    Scheduled Meds: . anastrozole  1 mg Oral Daily  . apixaban  5 mg Oral BID  . aspirin EC  81 mg Oral BID  . atorvastatin  20 mg Oral Daily  . dexamethasone  2 mg Oral BID WC  . gabapentin  200 mg Oral QHS  . levETIRAcetam  500 mg Oral BID  . Mesalamine  1,600 mg Oral BID WC  . sodium chloride flush  3 mL Intravenous Q12H   Continuous Infusions: . sodium chloride 75 mL/hr at 02/15/18 0041  . sodium chloride      Principal Problem:   TIA (transient ischemic attack) Active Problems:   Breast cancer of upper-outer quadrant of left female breast ([place])   Brain metastasis ([place])   Hypertension   Diabetes mellitus without complication ([place])  Stage IV breast cancer in female [facility])   Type 2 diabetes mellitus ([place])   Ulcerative colitis without complications ([facility])   Asthma   Neuropathy    Time spent: 65 minutes    Severn NP  [facility] If 7PM-7AM, please contact night-coverage at [email or web address], password [facility] 02/15/2018, 12:29 PM  LOS: 0 days

## 2018-02-15 NOTE — Consult Note (Addendum)
Neurology Consultation  Reason for Consult: Speech changes, concern for stroke versus seizures Referring Physician: Dr. Roel Cluck  CC: Speech difficulty, word finding difficulty, word salad  History is obtained from: Chart, patient unable to provide reliable history.  HPI: Brandi Dickson is a 58 y.o. female past medical history of metastatic breast cancer with mets to the brain, lungs, recent stroke in December after which she is started on apixaban and aspirin, presented to the emergency room at [facility] for evaluation of difficulty speaking and word substitutions. Reportedly she was having dinner with friends when she started having difficulty forming sentences and words and her speech sounded like a word salad.  This had not happened before and it was brought in for concern for stroke.  Telemedicine neurology consultation was obtained which found her symptoms to be only related to a aphasia-NIH 2, hence TPA was not offered along with the fact that she had a stroke about 3 weeks ago. Patient was transferred over to [facility] for further work-up as well as an EEG with the thought that this might be complex partial seizure given multiple brain mets. Patient was very agitated at the time of this exam and did not want to cooperate.  She said that she has not been able to sleep and very tired and part of her problems might be the fact that she has not slept. She cooperated only for a very cursory NIH stroke scale as documented below.   LKW: 1915 hrs. on 02/14/2018 tpa given?: no, symptoms deemed to be too mild-initial evaluation by telemedicine neurology at [facility]. Premorbid modified Rankin scale (mRS):0  ROS: ROS as documented in the HPI, otherwise negative but is not very reliable given the patient's aphasia.  Past Medical History:  Diagnosis Date  . Arthritis   . Asthma    triggered with [name] perfumes and cigarette smoke  . Cancer ([place])   . Colitis   .  Diabetes mellitus without complication ([place])   . History of radiation therapy 11/22/16-01/10/17   left breast 50.4 Gy in 28 fractions, axillary region 45 Gy in 25 fractions, lumpectomy cavity boost 10 Gy tin 5 fractions  . Hypertension   . Neuropathy    Family History  Problem Relation Age of Onset  . Colon cancer Father   . Stomach cancer Paternal Uncle   . Stomach cancer Paternal Uncle   . Melanoma Brother   . Thyroid cancer Brother   . Breast cancer Maternal Aunt   . Breast cancer Maternal Aunt   . Breast cancer Cousin     Social History:   reports that she quit smoking about 15 years ago. Her smoking use included cigarettes. She has a 29.00 pack-year smoking history. She has never used smokeless tobacco. She reports that she does not drink alcohol or use drugs.  Medications  Current Facility-Administered Medications:  .  0.9 %  sodium chloride infusion, , Intravenous, Continuous, Doutova, Anastassia, MD, Last Rate: 75 mL/hr at 02/15/18 0041 .  0.9 %  sodium chloride infusion, 250 mL, Intravenous, PRN, Roel Cluck, Anastassia, MD .  acetaminophen (TYLENOL) tablet 650 mg, 650 mg, Oral, Q4H PRN, 650 mg at 02/15/18 0107 **OR** acetaminophen (TYLENOL) solution 650 mg, 650 mg, Per Tube, Q4H PRN **OR** acetaminophen (TYLENOL) suppository 650 mg, 650 mg, Rectal, Q4H PRN, Doutova, Anastassia, MD .  anastrozole (ARIMIDEX) tablet 1 mg, 1 mg, Oral, Daily, Doutova, Anastassia, MD .  apixaban (ELIQUIS) tablet 5 mg, 5 mg, Oral, BID, Doutova,  Anastassia, MD, 5 mg at 02/15/18 0039 .  aspirin EC tablet 81 mg, 81 mg, Oral, BID, Doutova, Anastassia, MD, 81 mg at 02/15/18 0039 .  atorvastatin (LIPITOR) tablet 20 mg, 20 mg, Oral, Daily, Doutova, Anastassia, MD .  dexamethasone (DECADRON) tablet 2 mg, 2 mg, Oral, BID WC, Doutova, Anastassia, MD .  gabapentin (NEURONTIN) capsule 200 mg, 200 mg, Oral, QHS, Doutova, Anastassia, MD, 200 mg at 02/15/18 0039 .  levETIRAcetam (KEPPRA) tablet 500 mg, 500 mg, Oral,  BID, Doutova, Anastassia, MD .  Mesalamine (ASACOL) DR capsule 1,600 mg, 1,600 mg, Oral, BID WC, Doutova, Anastassia, MD .  sodium chloride flush (NS) 0.9 % injection 3 mL, 3 mL, Intravenous, Q12H, Doutova, Anastassia, MD, 3 mL at 02/15/18 0048 .  sodium chloride flush (NS) 0.9 % injection 3 mL, 3 mL, Intravenous, PRN, Roel Cluck, Anastassia, MD  Facility-Administered Medications Ordered in Other Encounters:  .  heparin lock flush 100 unit/mL, 500 Units, Intracatheter, Once PRN, Truitt Merle, MD .  sodium chloride flush (NS) 0.9 % injection 10 mL, 10 mL, Intracatheter, PRN, Truitt Merle, MD  Exam: Current vital signs: BP 96/79 (BP Location: Right Arm)   Pulse 92   Temp 98.1 F (36.7 C) (Oral)   Resp (!) 21   Ht 5' 5"  (1.651 m)   Wt 77.7 kg   SpO2 98%   BMI 28.51 kg/m  Vital signs in last 24 hours: Temp:  [97.9 F (36.6 C)-98.4 F (36.9 C)] 98.1 F (36.7 C) (01/18 0202) Pulse Rate:  [31-92] 92 (01/18 0202) Resp:  [15-21] 21 (01/18 0202) BP: (91-119)/(64-88) 96/79 (01/18 0202) SpO2:  [95 %-100 %] 98 % (01/18 0202) Weight:  [74.8 kg-77.7 kg] 77.7 kg (01/17 2318) General: Comfortably sleeping in bed, becomes a little annoyed when asked to do certain things on the neurological exam. HEENT: Normocephalic atraumatic Lungs: Breathing normally saturating well on room air CVS: No arrhythmias noted on the monitor. Extremities: Warm peripheral perfused Neurological exam She was sleeping, was awake alert oriented when woke up. She has no dysarthria but she has expressive aphasia- Wernicke's type. She was able to follow simple commands without problem. Cranial nerves: Pupils are equal round reactive to light, her left eye cannot cross the midline and AB duct, no visual field cuts appreciated, no facial asymmetry appreciated. Motor exam: 5/5 without drift in all 4 extremities Sensory exam: Intact light touch all over Did not perform coordination NIH stroke scale-2 for aphasia.  Labs I have  reviewed labs in epic and the results pertinent to this consultation are: Mild leukocytosis, polycythemia, mild hyperglycemia. CBC    Component Value Date/Time   WBC 10.8 (H) 02/14/2018 1956   RBC 5.34 (H) 02/14/2018 1956   HGB 15.6 (H) 02/14/2018 2005   HGB 12.5 01/18/2017 0853   HCT 46.0 02/14/2018 2005   HCT 38.2 01/18/2017 0853   PLT 311 02/14/2018 1956   PLT 178 01/18/2017 0853   MCV 84.5 02/14/2018 1956   MCV 92.0 01/18/2017 0853   MCH 26.2 02/14/2018 1956   MCHC 31.0 02/14/2018 1956   RDW 14.3 02/14/2018 1956   RDW 18.1 (H) 01/18/2017 0853   LYMPHSABS 1.5 02/14/2018 1956   LYMPHSABS 1.4 01/18/2017 0853   MONOABS 1.2 (H) 02/14/2018 1956   MONOABS 0.6 01/18/2017 0853   EOSABS 0.1 02/14/2018 1956   EOSABS 0.5 01/18/2017 0853   BASOSABS 0.0 02/14/2018 1956   BASOSABS 0.0 01/18/2017 0853    CMP     Component Value Date/Time   NA  137 02/14/2018 2005   NA 138 01/18/2017 0853   K 3.8 02/14/2018 2005   K 3.5 01/18/2017 0853   CL 102 02/14/2018 2005   CO2 24 02/14/2018 1956   CO2 25 01/18/2017 0853   GLUCOSE 120 (H) 02/14/2018 2005   GLUCOSE 102 01/18/2017 0853   BUN 19 02/14/2018 2005   BUN 18.6 01/18/2017 0853   CREATININE 0.80 02/14/2018 2005   CREATININE 0.9 01/18/2017 0853   CALCIUM 9.0 02/14/2018 1956   CALCIUM 9.1 01/18/2017 0853   PROT 7.4 02/14/2018 1956   PROT 6.9 01/18/2017 0853   ALBUMIN 3.7 02/14/2018 1956   ALBUMIN 3.6 01/18/2017 0853   AST 27 02/14/2018 1956   AST 17 01/18/2017 0853   ALT 24 02/14/2018 1956   ALT 15 01/18/2017 0853   ALKPHOS 100 02/14/2018 1956   ALKPHOS 99 01/18/2017 0853   BILITOT 1.1 02/14/2018 1956   BILITOT 0.68 01/18/2017 0853   GFRNONAA >60 02/14/2018 1956   GFRAA >60 02/14/2018 1956    Lipid Panel     Component Value Date/Time   CHOL 190 01/28/2018 0431   TRIG 131 01/28/2018 0431   HDL 41 01/28/2018 0431   CHOLHDL 4.6 01/28/2018 0431   VLDL 26 01/28/2018 0431   LDLCALC 123 (H) 01/28/2018 0431   Imaging I  have reviewed the images obtained:  CT-scan of the brain obtained as a part of code stroke showed a 1 cm hypodensity in the deep white matter of the posterior right centrum semiovale likely reflecting sequela of subacute ischemia/interval evolution of the previously seen infarct in this location.  No acute abnormality.  Aspects 10.  No bleed. MRI of the brain from 01/28/2018 showed at least 14 focal enhancing lesions in the cerebrum and cerebellum which are separate from the foci of restricted diffusion-representing metastases and embolic infarcts at the time.  Assessment:  58 year old with metastatic breast cancer to the brain presenting for an episode of word finding difficulty and a aphasia, continues to have aphasia at this time, was evaluated by telemedicine neurology and not deemed to be a candidate for IV TPA due to recent stroke and mild symptoms. She is outside of the window for IV TPA at this time.  She certainly can be hypercoagulable and have strokes as an etiology of this presentation, but due to multiple metastases in the brain, seizure etiology should also be evaluated. In any case she will need further imaging and EEG.   Impression: Evaluate for acute ischemic stroke Evaluate for seizures History of brain metastases - evaluate for progression of metastases History of recent acute ischemic stroke- embolic-looking  Recommendations: Continue on current anticoagulant EEG in the morning MRI of the brain with and without contrast the morning Load with Keppra and then continue Keppra 500 BID. No need to repeat stroke work-up. If she continues to have embolic strokes on Eliquis and aspirin, her anticoagulation should be changed to Lovenox if the etiology of the strokes is deemed to be secondary to hypercoagulability from cancer. Will defer to stroke team if MRI shows more strokes.    -- Amie Portland, MD [facility] Pager: [phone] If 7pm to 7am, please call on  call as listed on AMION.

## 2018-02-15 NOTE — Progress Notes (Signed)
Started on Keppra on admission for possible seizure. Although subsequent EEG was normal, the risk of seizures is felt to be high enough from cortical metastases to continue her on anticonvulsant medication.   MRI reveals multifocal small acute ischemic infarctions, appearing most consistent with either strokes due to hypercoagulability or cardioembolic strokes.   Stroke team to follow in the AM.   Electronically signed: Dr. Kerney Elbe

## 2018-02-15 NOTE — Evaluation (Signed)
Speech Language Pathology Evaluation Patient Details Name: Brandi Dickson MRN: [id number] DOB: 05/20/1960 Today's Date: 02/15/2018 Time: 3474-2595 SLP Time Calculation (min) (ACUTE ONLY): 40 min  Problem List:  Patient Active Problem List   Diagnosis Date Noted  . Stroke ([place]) 02/15/2018  . TIA (transient ischemic attack) 02/14/2018  . Cancer ([place])   . Metastatic malignant neoplasm ([place])   . Goals of care, counseling/discussion   . Brain metastasis ([place]) 01/26/2018  . Stage IV breast cancer in female [facility]) 01/26/2018  . Ulcerative colitis ([place]) 01/26/2018  . Type 2 diabetes mellitus ([place]) 01/26/2018  . Asthma 01/26/2018  . Neuropathy 01/26/2018  . Cerebral embolism with cerebral infarction 01/26/2018  . Ulcerative colitis with rectal bleeding ([place]) 05/31/2017  . Ulcerative colitis with complication ([place]) 63/87/5643  . Lactic acidosis 07/19/2016  . Antineoplastic chemotherapy induced pancytopenia ([place])   . GIB (gastrointestinal bleeding) 06/07/2016  . Hypertension   . Diabetes mellitus without complication ([place])   . Ulcerative colitis without complications ([place]) 32/95/1884  . Hematochezia   . Generalized abdominal pain   . Nausea vomiting and diarrhea   . Diarrhea of presumed infectious origin   . Hyponatremia 05/18/2016  . Hypokalemia 05/18/2016  . Hypomagnesemia 05/18/2016  . Dehydration 05/18/2016  . Blood in stool 05/18/2016  . Thrombocytopenia ([place]) 05/18/2016  . GI bleeding 05/18/2016  . Port catheter in place 05/11/2016  . Breast cancer of upper-outer quadrant of left female breast (Brandi Dickson) 05/02/2016  . S/P left THA, AA 03/06/2016  . Rectal bleeding 07/14/2015  . LLQ abdominal pain 07/14/2015  . Special screening for malignant neoplasms, colon 07/14/2015  . Loss of weight 07/14/2015  . Family history of colon cancer 07/14/2015   Past Medical History:  Past Medical History:  Diagnosis Date  . Arthritis   . Asthma    triggered with Brandi Dickson perfumes and  cigarette smoke  . Cancer ([place]andi Dickson)   . Colitis   . Diabetes mellitus without complication (Brandi Dickson)   . History of radiation therapy 11/22/16-01/10/17   left breast 50.4 Gy in 28 fractions, axillary region 45 Gy in 25 fractions, lumpectomy cavity boost 10 Gy tin 5 fractions  . Hypertension   . Neuropathy    Past Surgical History:  Past Surgical History:  Procedure Laterality Date  . ABDOMINAL HYSTERECTOMY    . BREAST LUMPECTOMY WITH RADIOACTIVE SEED AND SENTINEL LYMPH NODE BIOPSY Left 10/09/2016   Procedure: LEFT BREAST LUMPECTOMY WITH RADIOACTIVE SEED AND L4EFT AXILLARY SENTINEL LYMPH NODE BIOPSY;  Surgeon: [name], MD;  Location: [place];  Service: General;  Laterality: Left;  . DILATION AND CURETTAGE OF UTERUS    . KNEE ARTHROSCOPY Left   . PORTACATH PLACEMENT Right 05/08/2016   Procedure: INSERTION PORT-A-CATH WITH Brandi Dickson;  Surgeon: [name], MD;  Location: [place];  Service: General;  Laterality: Right;  . TONSILLECTOMY    . TOTAL HIP ARTHROPLASTY Right   . TOTAL HIP ARTHROPLASTY Left 03/06/2016   Procedure: LEFT TOTAL HIP ARTHROPLASTY ANTERIOR APPROACH;  Surgeon: [name], MD;  Location: [facility];  Service: Orthopedics;  Laterality: Left;   HPI:  Pt is a 58 y/o female admitted 01/26/18 presenting with blurred vision and difficulty walking. MRI reveals "Small scattered acute infarcts in the bilateral cerebrum, right cerebellum, and left posterior pons." and "enhancing lesion in the left parietal cortex which now measures approximately 4 mm"  PMH: breast CA with mets to brain, B THA, arthritis, HTN. CT 12/31 + Interval development of metastatic  disease including mediastinal and right hilar adenopathy, B pulmonary nodlules, R pleural effusion with suggestion of peritoneal nodularity, hepatic metastatic disease and splenic metastatic disease.   Assessment / Plan / Recommendation Clinical Impression   Patient presents with moderate cognitive  communication impairment, mild expressive aphasia and mild verbal apraxia. Simple conversation is functional, as is confrontation naming, however with mod complex conversation pt with frequent dysnomia, phonemic/verbal/semantic paraphasias, of which she is largely aware. Apraxic errors noted with increased length of utterance. Administered MOCA- Basic; pt scored 15/30 (26 and above is considered within normal limits). Pt with good intellectual awareness of difficulties with problem solving, memory, attention, and organization; she verbalized difficulties during clock drawing, functional math, delayed recall, and selective digit reading tasks. She also compensates well for visual deficits, scanning and rotating images for improved accuracy. Pt expresses desire to return home; will have 24 hour supervision. I recommend skilled ST in outpatient setting to improve communication abilities, maximize cognition for safety and quality of life.     SLP Assessment  SLP Recommendation/Assessment: Patient needs continued Speech Lanaguage Pathology Services SLP Visit Diagnosis: Aphasia (R47.01);Apraxia (R48.2);Cognitive communication deficit (R41.841)    Follow Up Recommendations  Outpatient SLP, 24 hour supervision/assistance   Frequency and Duration min 2x/week  2 weeks      SLP Evaluation Cognition  Overall Cognitive Status: Impaired/Different from baseline Arousal/Alertness: Awake/alert Orientation Level: Oriented X4 Attention: Sustained;Selective Sustained Attention: Impaired Sustained Attention Impairment: Verbal basic;Functional basic Selective Attention: Impaired Selective Attention Impairment: Verbal basic;Functional basic Memory: Impaired Memory Impairment: Decreased recall of new information;Decreased short term memory(immediate recall 5/5, delayed 0/5) Decreased Short Term Memory: Verbal basic Awareness: Impaired Awareness Impairment: Emergent impairment;Anticipatory impairment Problem  Solving: Impaired Problem Solving Impairment: Verbal basic(pt aware of difficulties) Executive Function: Reasoning;Organizing Reasoning: Impaired Reasoning Impairment: Verbal basic Organizing: Impaired Organizing Impairment: Verbal basic;Functional basic Behaviors: Impulsive Safety/Judgment: Impaired       Comprehension  Auditory Comprehension Overall Auditory Comprehension: Appears within functional limits for tasks assessed Yes/No Questions: Within Functional Limits Commands: Within Functional Limits Conversation: (Mod complex) Interfering Components: Attention Visual Recognition/Discrimination Discrimination: Within Function Limits Reading Comprehension Reading Status: Impaired(paraphasias at word level, self-corrects) Interfering Components: Attention;Visual scanning    Expression Expression Primary Mode of Expression: Verbal Verbal Expression Overall Verbal Expression: Impaired Initiation: No impairment Automatic Speech: Name;Social Response Level of Generative/Spontaneous Verbalization: Conversation Repetition: No impairment(intact for complex sentences; apraxic error x1 self correcte) Naming: Impairment Confrontation: Within functional limits(objects in room and pictures) Divergent: 25-49% accurate(5 fruits in 60 seconds) Verbal Errors: Neologisms;Phonemic paraphasias;Semantic paraphasias;Aware of errors Pragmatics: No impairment Effective Techniques: Semantic cues;Sentence completion Written Expression Dominant Hand: Right Written Expression: Exceptions to [facility] Self Formulation Ability: Sentence(written paraphasias in sentence level )   Oral / Motor  Motor Speech Overall Motor Speech: Impaired Respiration: Within functional limits Articulation: Within functional limitis Intelligibility: Intelligible Motor Planning: Impaired Level of Impairment: Insurance risk surveyor Errors: Groping for words;Aware;Inconsistent Effective Techniques: Slow rate   GO                    Deneise Lever, MS, CCC-SLP Speech-Language Pathologist Acute Rehabilitation Services Pager: [phone] Office: [phone]  Aliene Altes 02/15/2018, 4:26 PM

## 2018-02-15 NOTE — Progress Notes (Signed)
Patient refused exam check.Dr.Arora aware allow patient to rest until 0600.Will continue to monitor.

## 2018-02-15 NOTE — Progress Notes (Signed)
ANTICOAGULATION CONSULT NOTE - Initial Consult  Pharmacy Consult for lovenox Indication: stroke  Allergies  Allergen Reactions  . Gadavist [Gadobutrol] Nausea And Vomiting    Severe vomiting within 1 min of injection.   . Lisinopril Palpitations  . Augmentin [Amoxicillin-Pot Clavulanate] Other (See Comments)    sts gives her a yeast infection Has patient had a PCN reaction causing immediate rash, facial/tongue/throat swelling, SOB or lightheadedness with hypotension: no Has patient had a PCN reaction causing severe rash involving mucus membranes or skin necrosis: no Has patient had a PCN reaction that required hospitalization no Has patient had a PCN reaction occurring within the last 10 years: unknown If all of the above answers are "NO", then may proceed with Cephalosporin use.   . Benadryl [Diphenhydramine] Itching and Anxiety    Per pt: "Makes my skin crawl"; makes pt sensitive to touch  . Losartan Potassium Palpitations    Patient Measurements: Height: 5' 5"  (165.1 cm) Weight: 171 lb 4.8 oz (77.7 kg) IBW/kg (Calculated) : 57 Heparin Dosing Weight:   Vital Signs: Temp: 97.6 F (36.4 C) (01/18 0809) Temp Source: Oral (01/18 0809) BP: 111/58 (01/18 0809) Pulse Rate: 102 (01/18 0809)  Labs: Recent Labs    02/14/18 1956 02/14/18 2005 02/15/18 0650  HGB 14.0 15.6*  --   HCT 45.1 46.0  --   PLT 311  --   --   APTT 30  --   --   LABPROT 12.7  --   --   INR 0.96  --   --   CREATININE 0.94 0.80  --   TROPONINI  --   --  0.03*    Estimated Creatinine Clearance: 78.1 mL/min (by C-G formula based on SCr of 0.8 mg/dL).   Medical History: Past Medical History:  Diagnosis Date  . Arthritis   . Asthma    triggered with [name] perfumes and cigarette smoke  . Cancer ([place])   . Colitis   . Diabetes mellitus without complication ([place])   . History of radiation therapy 11/22/16-01/10/17   left breast 50.4 Gy in 28 fractions, axillary region 45 Gy in 25 fractions,  lumpectomy cavity boost 10 Gy tin 5 fractions  . Hypertension   . Neuropathy     Medications:  Medications Prior to Admission  Medication Sig Dispense Refill Last Dose  . anastrozole (ARIMIDEX) 1 MG tablet Take 1 tablet (1 mg total) by mouth daily. 30 tablet 5 02/13/2018 at Unknown time  . apixaban (ELIQUIS) 5 MG TABS tablet Take 1 tablet (5 mg total) by mouth 2 (two) times daily. 60 tablet 1 02/13/2018 at 2000  . aspirin EC 81 MG tablet Take 81 mg by mouth 2 (two) times daily.   02/14/2018 at Unknown time  . atorvastatin (LIPITOR) 20 MG tablet Take 1 tablet (20 mg total) by mouth daily. 30 tablet 1 02/13/2018 at Unknown time  . dexamethasone (DECADRON) 4 MG tablet Take 1 tablet (4 mg total) by mouth 2 (two) times daily with a meal. 40 tablet 2 02/13/2018 at Unknown time  . gabapentin (NEURONTIN) 100 MG capsule Take 200 mg by mouth at bedtime.    02/13/2018 at Unknown time  . Garlic (GARLIQUE) 096 MG TBEC Take 400 mg by mouth daily.   Past Week at Unknown time  . Ginkgo Biloba Extract 120 MG CAPS Take 120 mg by mouth daily with breakfast.   Past Week at Unknown time  . Mesalamine 800 MG TBEC 2 TABLETS BY MOUTH THREE TIMES A  DAY (Patient taking differently: Take 2 tablets by mouth 2 (two) times daily. ) 180 tablet 2 02/14/2018 at Unknown time  . metFORMIN (GLUCOPHAGE-XR) 500 MG 24 hr tablet Take 1,000 mg by mouth at bedtime.   02/13/2018 at Unknown time  . metoprolol tartrate (LOPRESSOR) 25 MG tablet Take 1 tablet (25 mg total) by mouth 2 (two) times daily. 60 tablet 11 02/14/2018 at 0800  . potassium chloride SA (KLOR-CON M20) 20 MEQ tablet Take 1 tablet (20 mEq total) by mouth 2 (two) times daily. (Patient taking differently: Take 20 mEq by mouth daily with breakfast. ) 60 tablet 2 02/13/2018 at Unknown time  . albuterol (PROVENTIL HFA;VENTOLIN HFA) 108 (90 Base) MCG/ACT inhaler Inhale 1-2 puffs into the lungs every 6 (six) hours as needed for wheezing or shortness of breath.   unknown  . dicyclomine  (BENTYL) 20 MG tablet Take 1 tablet (20 mg total) by mouth 4 (four) times daily -  before meals and at bedtime. (Patient taking differently: Take 20 mg by mouth 3 (three) times daily as needed for spasms. ) 30 tablet 0 unknown  . loperamide (IMODIUM) 2 MG capsule Take 4 mg by mouth as needed for diarrhea or loose stools.   unknown   Scheduled:  . anastrozole  1 mg Oral Daily  . aspirin EC  81 mg Oral BID  . atorvastatin  20 mg Oral Daily  . dexamethasone  2 mg Oral BID WC  . gabapentin  200 mg Oral QHS  . levETIRAcetam  500 mg Oral BID  . Mesalamine  1,600 mg Oral BID WC  . sodium chloride flush  3 mL Intravenous Q12H   Infusions:  . sodium chloride 75 mL/hr at 02/15/18 0041  . sodium chloride      Assessment: Pt has has a hx of breast CA with mets to lungs and brain who presented symptoms of stroke. She has been on apixaban for a recent stroke and her dose was given this AM. Her MRI came back with likely new infarct. The plan is to use enoxaparin instead with the suspicion that the clot is from the cancer source. We will start lovenox tonight when the next dose of apixaban is due.   Goal of Therapy:  Anti-Xa level 0.6-1 units/ml 4hrs after LMWH dose given Monitor platelets by anticoagulation protocol: Yes   Plan:   Dc apixaban Lovenox 1m SQ q12 F/u with bleeding   MOnnie Boer PharmD, BCIDP, AAHIVP, CPP Infectious Disease Pharmacist 02/15/2018 2:02 PM

## 2018-02-15 NOTE — Evaluation (Signed)
Physical Therapy Evaluation Patient Details Name: Brandi Dickson MRN: [id number] DOB: April 13, 1960 Today's Date: 02/15/2018   History of Present Illness  Pt is a 58 y/o F who presents with difficulty speaking and word substitutions. MRI showing multiple enhancing metastatic deposits throughotu the brain and cerebellum. Most lesions are stable since prior study but left parietal cortical lesion has progressed and measures 4 mm in diameter. Multiple new areas of restricted diffusion are seen throughout the cerebral hemispheres and cerebellum bilaterally suggestive of acute infarct. PMH: breast CA, B THA, arthritis, HTN. CT 12/31 + Interval development of metastatic disease including mediastinal and right hilar adenopathy, B pulmonary nodlules, R pleural effusion with suggestion of peritoneal nodularity, hepatic metastatic disease and splenic metastatic desease.   Clinical Impression  Pt admitted with above diagnosis. Pt currently with functional limitations due to the deficits listed below (see PT Problem List). Patient presenting with decreased functional mobility secondary to decreased balance, diminished activity tolerance, and cognitive deficits including decreased awareness and attention. Pt ambulating 60 feet with min-mod assist, but suspect she would be more independent with use of assistive device. HR peak at 122 bpm. Pt will benefit from skilled PT to increase their independence and safety with mobility to allow discharge to the venue listed below.       Follow Up Recommendations Supervision/Assistance - 24 hour;Outpatient PT    Equipment Recommendations  None recommended by PT    Recommendations for Other Services       Precautions / Restrictions Precautions Precautions: Fall Precaution Comments: diplopia (has taped glasses) Restrictions Weight Bearing Restrictions: No      Mobility  Bed Mobility Overal bed mobility: Needs Assistance Bed Mobility: Supine to Sit     Supine  to sit: Supervision     General bed mobility comments: OOB in chair  Transfers Overall transfer level: Needs assistance Equipment used: None Transfers: Sit to/from Stand Sit to Stand: Min guard         General transfer comment: Ambulation min A  Ambulation/Gait Ambulation/Gait assistance: Min assist;Mod assist Gait Distance (Feet): 80 Feet Assistive device: None Gait Pattern/deviations: Step-through pattern;Decreased stride length;Narrow base of support;Leaning posteriorly Gait velocity: decreased Gait velocity interpretation: <1.8 ft/sec, indicate of risk for recurrent falls General Gait Details: Pt with slow, unsteady gait, reaching for objects for increased support. Occasional posterior lean with loss of balance dynamically requiring min-modA  Stairs            Wheelchair Mobility    Modified Rankin (Stroke Patients Only) Modified Rankin (Stroke Patients Only) Pre-Morbid Rankin Score: Moderate disability Modified Rankin: Moderately severe disability     Balance Overall balance assessment: Needs assistance Sitting-balance support: No upper extremity supported;Feet supported Sitting balance-Leahy Scale: Good     Standing balance support: No upper extremity supported Standing balance-Leahy Scale: Fair Standing balance comment: statically fair                             Pertinent Vitals/Pain Pain Assessment: Faces Faces Pain Scale: Hurts a little bit Pain Location: headache Pain Descriptors / Indicators: Headache Pain Intervention(s): Monitored during session    Home Living Family/patient expects to be discharged to:: Private residence Living Arrangements: Spouse/significant other Available Help at Discharge: Family;Available 24 hours/day Type of Home: House Home Access: Stairs to enter Entrance Stairs-Rails: Right Entrance Stairs-Number of Steps: 4 Home Layout: One level Home Equipment: Cane - single point;Walker - 2  wheels;Crutches;Wheelchair - [employer];Shower seat Additional Comments:  husband works    Prior Function Level of Independence: Secondary school teacher / Transfers Assistance Needed: intermittent S  ADL's / Homemaking Assistance Needed: intermittent S  Comments: working, driving, enjoys traveling, has 3 cats and 2 dogs (up until last Dec when she had first CVA)     Hand Dominance   Dominant Hand: Right    Extremity/Trunk Assessment   Upper Extremity Assessment Upper Extremity Assessment: Overall WFL for tasks assessed    Lower Extremity Assessment Lower Extremity Assessment: RLE deficits/detail;LLE deficits/detail RLE Coordination: decreased gross motor LLE Coordination: decreased gross motor       Communication   Communication: Expressive difficulties(expressive aphasia)  Cognition Arousal/Alertness: Awake/alert Behavior During Therapy: WFL for tasks assessed/performed Overall Cognitive Status: Impaired/Different from baseline Area of Impairment: Safety/judgement;Problem solving                   Current Attention Level: Selective     Safety/Judgement: Decreased awareness of safety;Decreased awareness of deficits   Problem Solving: Difficulty sequencing;Requires tactile cues;Requires verbal cues General Comments: Pt with mild aphasia and occasional "word salad." Benefits from extra time to find appropriate word       General Comments      Exercises     Assessment/Plan    PT Assessment Patient needs continued PT services  PT Problem List Decreased activity tolerance;Decreased balance;Decreased mobility;Decreased coordination;Decreased cognition;Decreased knowledge of use of DME;Decreased safety awareness;Decreased knowledge of precautions       PT Treatment Interventions DME instruction;Gait training;Stair training;Functional mobility training;Therapeutic activities;Therapeutic exercise;Neuromuscular re-education;Balance training;Cognitive  remediation;Patient/family education    PT Goals (Current goals can be found in the Care Plan section)  Acute Rehab PT Goals Patient Stated Goal: to go home  PT Goal Formulation: With patient Time For Goal Achievement: 03/01/18 Potential to Achieve Goals: Good    Frequency Min 4X/week   Barriers to discharge        Co-evaluation               AM-PAC PT "6 Clicks" Mobility  Outcome Measure Help needed turning from your back to your side while in a flat bed without using bedrails?: None Help needed moving from lying on your back to sitting on the side of a flat bed without using bedrails?: None Help needed moving to and from a bed to a chair (including a wheelchair)?: A Little Help needed standing up from a chair using your arms (e.g., wheelchair or bedside chair)?: None Help needed to walk in hospital room?: A Little Help needed climbing 3-5 steps with a railing? : A Lot 6 Click Score: 20    End of Session Equipment Utilized During Treatment: Gait belt Activity Tolerance: Patient tolerated treatment well Patient left: in chair;with call bell/phone within reach;with chair alarm set;with family/visitor present   PT Visit Diagnosis: Unsteadiness on feet (R26.81);Other abnormalities of gait and mobility (R26.89);Other symptoms and signs involving the nervous system (R29.898)    Time: 4010-2725 PT Time Calculation (min) (ACUTE ONLY): 23 min   Charges:   PT Evaluation $PT Eval Moderate Complexity: 1 Mod PT Treatments $Therapeutic Activity: 8-22 mins       Ellamae Sia, PT, DPT Acute Rehabilitation Services Pager [phone] Office [phone]   Willy Eddy 02/15/2018, 4:50 PM

## 2018-02-15 NOTE — Procedures (Signed)
History: 58 year old female being evaluated for speech changes  Sedation: None  Technique: This is a 21 channel routine scalp EEG performed at the bedside with bipolar and monopolar montages arranged in accordance to the international 10/20 system of electrode placement. One channel was dedicated to EKG recording.    Background: The background consists of intermixed alpha and beta activities. There is a well defined posterior dominant rhythm of 8 hz that attenuates with eye opening.  There is an increase in delta associated with drowsiness.  Sleep is not recorded.   Photic stimulation: Physiologic driving is not performed  EEG Abnormalities: None  Clinical Interpretation: This normal EEG is recorded in the waking and drowsy state. There was no seizure or seizure predisposition recorded on this study. Please note that lack of epileptiform activity on EEG does not preclude the possibility of epilepsy.   Roland Rack, MD [facility] [phone]  If 7pm- 7am, please page neurology on call as listed in [place].

## 2018-02-16 LAB — CBC
HCT: 37.9 % (ref 36.0–46.0)
Hemoglobin: 12.2 g/dL (ref 12.0–15.0)
MCH: 26.6 pg (ref 26.0–34.0)
MCHC: 32.2 g/dL (ref 30.0–36.0)
MCV: 82.8 fL (ref 80.0–100.0)
NRBC: 0 % (ref 0.0–0.2)
Platelets: 260 10*3/uL (ref 150–400)
RBC: 4.58 MIL/uL (ref 3.87–5.11)
RDW: 14.4 % (ref 11.5–15.5)
WBC: 6.7 10*3/uL (ref 4.0–10.5)

## 2018-02-16 LAB — BASIC METABOLIC PANEL
Anion gap: 10 (ref 5–15)
BUN: 9 mg/dL (ref 6–20)
CHLORIDE: 105 mmol/L (ref 98–111)
CO2: 24 mmol/L (ref 22–32)
Calcium: 8.6 mg/dL — ABNORMAL LOW (ref 8.9–10.3)
Creatinine, Ser: 0.76 mg/dL (ref 0.44–1.00)
GFR calc Af Amer: >60 mL/min (ref >60)
Glucose, Bld: 115 mg/dL — ABNORMAL HIGH (ref 70–99)
POTASSIUM: 3.8 mmol/L (ref 3.5–5.1)
Sodium: 139 mmol/L (ref 135–145)

## 2018-02-16 LAB — TSH: TSH: 0.269 u[IU]/mL — ABNORMAL LOW (ref 0.350–4.500)

## 2018-02-16 LAB — GLUCOSE, CAPILLARY: Glucose-Capillary: 107 mg/dL — ABNORMAL HIGH (ref 70–99)

## 2018-02-16 MED ORDER — ATORVASTATIN CALCIUM 40 MG PO TABS: 40.0000 mg | ORAL_TABLET | Freq: Every day | ORAL | Status: DC

## 2018-02-16 MED ORDER — ENOXAPARIN SODIUM 80 MG/0.8ML ~~LOC~~ SOLN: 1.0000 mg/kg | Freq: Two times a day (BID) | SUBCUTANEOUS | 0 refills | Status: DC

## 2018-02-16 MED ORDER — LEVETIRACETAM 500 MG PO TABS: 500.0000 mg | ORAL_TABLET | Freq: Two times a day (BID) | ORAL | 0 refills | Status: DC

## 2018-02-16 NOTE — Addendum Note (Signed)
Encounter addended by: Tyler Pita, MD on: 02/16/2018 2:30 PM  Actions taken: Medication List reviewed, Problem List reviewed, Allergies reviewed

## 2018-02-16 NOTE — Progress Notes (Signed)
Occupational Therapy Treatment Patient Details Name: Brandi Dickson MRN: [id number] DOB: Mar 10, 1960 Today's Date: 02/16/2018    History of present illness Pt is a 58 y/o F who presents with difficulty speaking and word substitutions. MRI showing multiple enhancing metastatic deposits throughotu the brain and cerebellum. Most lesions are stable since prior study but left parietal cortical lesion has progressed and measures 4 mm in diameter. Multiple new areas of restricted diffusion are seen throughout the cerebral hemispheres and cerebellum bilaterally suggestive of acute infarct. PMH: breast CA, B THA, arthritis, HTN. CT 12/31 + Interval development of metastatic disease including mediastinal and right hilar adenopathy, B pulmonary nodlules, R pleural effusion with suggestion of peritoneal nodularity, hepatic metastatic disease and splenic metastatic desease.    OT comments  Educated pt and husband on convergence and tracking exercises today as well as talked to them about husband being with pt anytime she i up on her feet until he feels she is safe up and about by herself-- he verbalized understanding. Gave husband a gait belt yesterday. Recommended that pt continue to use the tub seat she has for safety and they both verbalized understanding. Pt will continue to benefit from acute OT with follow up OPOT.   Follow Up Recommendations  Outpatient OT;Supervision/Assistance - 24 hour    Equipment Recommendations  None recommended by OT       Precautions / Restrictions Precautions Precautions: Fall Precaution Comments: diplopia (has taped glasses) Restrictions Weight Bearing Restrictions: No       Mobility Bed Mobility               General bed mobility comments: Pt up in recliner  Transfers Overall transfer level: Needs assistance Equipment used: None Transfers: Sit to/from Stand Sit to Stand: Min guard         General transfer comment: ambulation without AD pt with a  couple of LOBs today where I felt like I had to help her recover, but I had her do alot more than yesterday when up on her feet (ie: walking with head turns, stopping abruptly and turning around, turning 360--yesterday she only walked 20 feet with 2 LOB)    Balance Overall balance assessment: Needs assistance Sitting-balance support: No upper extremity supported;Feet supported Sitting balance-Leahy Scale: Good     Standing balance support: No upper extremity supported Standing balance-Leahy Scale: Poor Standing balance comment: statically fair; dynamcally poor                           ADL either performed or assessed with clinical judgement   ADL                                               Vision Baseline Vision/History: Wears glasses Wears Glasses: Reading only(has not corrective glasses to wear when up and about or watching TV) Patient Visual Report: Blurring of vision;Diplopia Vision Assessment?: Yes Eye Alignment: Impaired (comment)(left eye is not at midline--a little to right) Ocular Range of Motion: Restricted on the left(left eye only, can go minimally past midline (pt reports that previoiusly eye would not move left at all)) Tracking/Visual Pursuits: Left eye does not track laterally Convergence: Within functional limits Visual Fields: Left visual field deficit(on left side) Additional Comments: When pt does not have her taped glasses on she turns her head left and  eyes right to make diplopia disappear as well as intermittently closing left eye          Cognition Arousal/Alertness: Awake/alert Behavior During Therapy: WFL for tasks assessed/performed Overall Cognitive Status: Impaired/Different from baseline Area of Impairment: Safety/judgement                               General Comments: says she can furniture walk safely in house--she may be able to but not sure since we are not in her home environment         Exercises Other Exercises Other Exercises: educated pt and husband on convergence exercise for pt (3x/day only 2-3 minutes each time) as well as working on tracking to left and upward and left and downward           Pertinent Vitals/ Pain       Pain Assessment: No/denies pain         Frequency  Min 3X/week        Progress Toward Goals  OT Goals(current goals can now be found in the care plan section)  Progress towards OT goals: Progressing toward goals     Plan Discharge plan remains appropriate       AM-PAC OT "6 Clicks" Daily Activity     Outcome Measure   Help from another person eating meals?: None Help from another person taking care of personal grooming?: A Little Help from another person toileting, which includes using toliet, bedpan, or urinal?: A Little Help from another person bathing (including washing, rinsing, drying)?: A Little Help from another person to put on and taking off regular upper body clothing?: A Little Help from another person to put on and taking off regular lower body clothing?: A Little 6 Click Score: 19    End of Session Equipment Utilized During Treatment: Gait belt  OT Visit Diagnosis: Unsteadiness on feet (R26.81);Other (comment)(double vision)   Activity Tolerance Patient tolerated treatment well   Patient Left in chair;with call bell/phone within reach;with chair alarm set;with family/visitor present   Nurse Communication          Time: 1206-1228 OT Time Calculation (min): 22 min  Charges: OT General Charges $OT Visit: 1 Visit OT Treatments $Self Care/Home Management : 8-22 mins  [address], OTR/L Acute [employer] Pager [phone] Office [phone]      Almon Register 02/16/2018, 12:56 PM

## 2018-02-16 NOTE — Care Management (Signed)
Verified [facility] has lovenox in stock. It will be $55.

## 2018-02-16 NOTE — Discharge Summary (Signed)
Physician Discharge Summary  Brandi Dickson WLN:[id number] DOB: 11/09/1960 DOA: 02/14/2018  PCP: Orpah Melter, MD  Admit date: 02/14/2018 Discharge date: 02/16/2018  Admitted From: Home Disposition:  Home  Recommendations for Outpatient Follow-up:  1. Follow up with PCP in 1-2 weeks 2. Please follow up with oncologist and neuro oncologist tomorrow 3. Please obtain BMP/CBC in one week Medications as prescribed  Home Health:None  Equipment/Devices: None   Discharge Condition: Stable  CODE STATUS: Full code  Diet recommendation: Heart healthy carb modified diet  Brief/Interim Summary: Brandi Dickson is a 58 y.o. female with a Past Medical History of breast cancer with metastases to brain and recent stroke in 12/2017; who presented for difficulty speaking.  MRI revealed new bilateral infarcts with signs of progression of previous metastases. Changing over to Lovenox and discontinuing Eliquis per neurology recommendation.  Patient reports that she would like to discharge on 02/16/18.  She mentions that she would not like to undergo further testing as she has three doctor's appointments tomorrow. Prescription was given for lovenox and patient was told to discuss further anticoagulation with her oncologist tomorrow.  Discharge Diagnoses:  Principal Problem:   TIA (transient ischemic attack) Active Problems:   Breast cancer of upper-outer quadrant of left female breast ([place])   Ulcerative colitis without complications (HCC)   Hypertension   Diabetes mellitus without complication ([place])   Brain metastasis ([place])   Stage IV breast cancer in female [facility])   Type 2 diabetes mellitus (HCC)   Asthma   Neuropathy   Stroke ([place])  TIA (transient ischemic attack)-hx metastatic brain cancerpresented with aphasia, word salad. CT reveals likely sequela of subacute ischemic/interval evolution of previously seen infarct. Initial Stroke scale 2. Evaluated by neurology who opine other  differential includes seizure. Recommended MRI and EEG as well as Keppra. CTA no significant vascular disease noted. Echorecently done 30 December showing no source of embolic disease. -TSH- -PT/OT evaluation -lovenox for anticoagulation  . Ulcerative colitis without complications ([place])- stable cont mesalamin  . Hypertension- controlled. Home meds include metoprolol.  . Brain metastasis ([place])- currently undergoing radiation to the brain next in on Monday 02/17/2018 -continue Decadron -continue Keppra for possible seizure activity  . Stage IV breast cancer in female [facility])- follow up with oncology.  . Asthma- stable cont home medications.  DM 2-- controled. Home meds include prednisone. A1c 6.0 Not on insulin  Discharge Instructions  Discharge Instructions    Call MD for:  difficulty breathing, headache or visual disturbances   Complete by:  As directed    Call MD for:  extreme fatigue   Complete by:  As directed    Call MD for:  persistant dizziness or light-headedness   Complete by:  As directed    Call MD for:  persistant nausea and vomiting   Complete by:  As directed    Call MD for:  temperature >100.4   Complete by:  As directed    Diet - low sodium heart healthy   Complete by:  As directed    Discharge instructions   Complete by:  As directed    F/u with all physicians as previously discussed   Increase activity slowly   Complete by:  As directed      Allergies as of 02/16/2018      Reactions   Gadavist [gadobutrol] Nausea And Vomiting   Severe vomiting within 1 min of injection.    Lisinopril Palpitations   Augmentin [amoxicillin-pot Clavulanate] Other (See Comments)   sts  gives her a yeast infection Has patient had a PCN reaction causing immediate rash, facial/tongue/throat swelling, SOB or lightheadedness with hypotension: no Has patient had a PCN reaction causing severe rash involving mucus membranes or skin necrosis: no Has patient had a  PCN reaction that required hospitalization no Has patient had a PCN reaction occurring within the last 10 years: unknown If all of the above answers are "NO", then may proceed with Cephalosporin use.   Benadryl [diphenhydramine] Itching, Anxiety   Per pt: "Makes my skin crawl"; makes pt sensitive to touch   Losartan Potassium Palpitations      Medication List    STOP taking these medications   apixaban 5 MG Tabs tablet Commonly known as:  ELIQUIS     TAKE these medications   albuterol 108 (90 Base) MCG/ACT inhaler Commonly known as:  PROVENTIL HFA;VENTOLIN HFA Inhale 1-2 puffs into the lungs every 6 (six) hours as needed for wheezing or shortness of breath.   anastrozole 1 MG tablet Commonly known as:  ARIMIDEX Take 1 tablet (1 mg total) by mouth daily.   aspirin EC 81 MG tablet Take 81 mg by mouth 2 (two) times daily.   atorvastatin 20 MG tablet Commonly known as:  LIPITOR Take 1 tablet (20 mg total) by mouth daily.   dexamethasone 4 MG tablet Commonly known as:  DECADRON Take 1 tablet (4 mg total) by mouth 2 (two) times daily with a meal.   dicyclomine 20 MG tablet Commonly known as:  BENTYL Take 1 tablet (20 mg total) by mouth 4 (four) times daily -  before meals and at bedtime. What changed:    when to take this  reasons to take this   enoxaparin 80 MG/0.8ML injection Commonly known as:  LOVENOX Inject 0.8 mLs (80 mg total) into the skin every 12 (twelve) hours for 10 days.   gabapentin 100 MG capsule Commonly known as:  NEURONTIN Take 200 mg by mouth at bedtime.   GARLIQUE 400 MG Tbec Generic drug:  Garlic Take 073 mg by mouth daily.   Ginkgo Biloba Extract 120 MG Caps Take 120 mg by mouth daily with breakfast.   levETIRAcetam 500 MG tablet Commonly known as:  KEPPRA Take 1 tablet (500 mg total) by mouth 2 (two) times daily for 30 days.   loperamide 2 MG capsule Commonly known as:  IMODIUM Take 4 mg by mouth as needed for diarrhea or loose  stools.   Mesalamine 800 MG Tbec 2 TABLETS BY MOUTH THREE TIMES A DAY What changed:    how much to take  how to take this  when to take this  additional instructions   metFORMIN 500 MG 24 hr tablet Commonly known as:  GLUCOPHAGE-XR Take 1,000 mg by mouth at bedtime.   metoprolol tartrate 25 MG tablet Commonly known as:  LOPRESSOR Take 1 tablet (25 mg total) by mouth 2 (two) times daily.   potassium chloride SA 20 MEQ tablet Commonly known as:  KLOR-CON M20 Take 1 tablet (20 mEq total) by mouth 2 (two) times daily. What changed:  when to take this       Allergies  Allergen Reactions  . Gadavist [Gadobutrol] Nausea And Vomiting    Severe vomiting within 1 min of injection.   . Lisinopril Palpitations  . Augmentin [Amoxicillin-Pot Clavulanate] Other (See Comments)    sts gives her a yeast infection Has patient had a PCN reaction causing immediate rash, facial/tongue/throat swelling, SOB or lightheadedness with hypotension: no Has patient  had a PCN reaction causing severe rash involving mucus membranes or skin necrosis: no Has patient had a PCN reaction that required hospitalization no Has patient had a PCN reaction occurring within the last 10 years: unknown If all of the above answers are "NO", then may proceed with Cephalosporin use.   . Benadryl [Diphenhydramine] Itching and Anxiety    Per pt: "Makes my skin crawl"; makes pt sensitive to touch  . Losartan Potassium Palpitations    Consultations:   Neurology  PT  OT  SLP   Procedures/Studies: Ct Angio Head W Or Wo Contrast  Result Date: 02/14/2018 CLINICAL DATA:  Stroke.  Aphasia EXAM: CT ANGIOGRAPHY HEAD AND NECK TECHNIQUE: Multidetector CT imaging of the head and neck was performed using the standard protocol during bolus administration of intravenous contrast. Multiplanar CT image reconstructions and MIPs were obtained to evaluate the vascular anatomy. Carotid stenosis measurements (when applicable) are  obtained utilizing NASCET criteria, using the distal internal carotid diameter as the denominator. CONTRAST:  150m ISOVUE-370 IOPAMIDOL (ISOVUE-370) INJECTION 76% COMPARISON:  CT head 02/14/2018 FINDINGS: CTA NECK FINDINGS Aortic arch: Standard branching. Imaged portion shows no evidence of aneurysm or dissection. No significant stenosis of the major arch vessel origins. Right carotid system: Negative for stenosis or atherosclerotic disease Left carotid system: Negative for stenosis or atherosclerotic disease Vertebral arteries: Both vertebral arteries widely patent without stenosis. Skeleton: Cervical spondylosis.  No acute skeletal abnormality. Other neck: Negative for mass or adenopathy. Upper chest: Moderately large right effusion. Peri carina adenopathy. Anterior mediastinal adenopathy. Review of the MIP images confirms the above findings CTA HEAD FINDINGS Anterior circulation: Cavernous carotid widely patent bilaterally. Anterior and middle cerebral arteries widely patent bilaterally. Posterior circulation: Both vertebral arteries patent to the basilar. PICA patent bilaterally. Superior cerebellar and posterior cerebral arteries widely patent bilaterally. Fetal origin left posterior cerebral artery. Venous sinuses: Patent Anatomic variants: None Delayed phase: Normal enhancement on delayed imaging. Hypodensity in the corona radiata on the right unchanged. Review of the MIP images confirms the above findings IMPRESSION: 1. Negative for emergent large vessel occlusion. 2. No significant carotid or vertebral artery stenosis. No significant intracranial stenosis. Electronically Signed   By: CFranchot GalloM.D.   On: 02/14/2018 20:20   Ct Angio Head W Or Wo Contrast  Result Date: 01/27/2018 CLINICAL DATA:  Stroke follow-up EXAM: CT ANGIOGRAPHY HEAD AND NECK TECHNIQUE: Multidetector CT imaging of the head and neck was performed using the standard protocol during bolus administration of intravenous contrast.  Multiplanar CT image reconstructions and MIPs were obtained to evaluate the vascular anatomy. Carotid stenosis measurements (when applicable) are obtained utilizing NASCET criteria, using the distal internal carotid diameter as the denominator. CONTRAST:  103mISOVUE-370 IOPAMIDOL (ISOVUE-370) INJECTION 76% COMPARISON:  Head CT and brain MRI from yesterday FINDINGS: CTA NECK FINDINGS Aortic arch: Normal appearance Right carotid system: Vessels are smooth and widely patent Left carotid system: Vessels are smooth and widely patent. No noted atheromatous changes Vertebral arteries: Vessels are smooth and widely patent. Early origin left PICA which branches from the V3 segment. Skeleton: Degenerative disease without acute finding Other neck: Enlarged lymph nodes in the low left posterior triangle and supraclavicular fossa. Incidental right thyroid nodule Upper chest: There is worrisome, centrally low-dense adenopathy in the bilateral mediastinum, thickest at the level of the subcarinal space where there is a 2 cm diameter. Layering right pleural effusion, at least moderate. There is subpleural nodularity and both upper lobes. Review of the MIP images confirms the above  findings CTA HEAD FINDINGS Anterior circulation: Vessels are smooth and widely patent Posterior circulation: Vessels are smooth and widely patent. Hypoplastic left P1 segment Venous sinuses: Patent Anatomic variants: As above Delayed phase: No abnormal intracranial enhancement Review of the MIP images confirms the above findings IMPRESSION: 1. Malignant-appearing adenopathy in the chest and left lower neck presumably related patient's history of breast cancer. Bilateral upper lobe subpleural nodularity suggesting lymphatic spread of tumor. Moderate right pleural effusion. 2. No significant vascular finding. Electronically Signed   By: Monte Fantasia M.D.   On: 01/27/2018 09:59   Dg Chest 1 View  Result Date: 01/30/2018 CLINICAL DATA:  Post right  thoracentesis EXAM: CHEST  1 VIEW COMPARISON:  11/28/2017 FINDINGS: Small moderate right pleural effusion with right base atelectasis. Left lung clear. Heart is normal size. No pneumothorax following thoracentesis. IMPRESSION: Small to moderate right pleural effusion with right lower lobe atelectasis. No pneumothorax following thoracentesis. Electronically Signed   By: Rolm Baptise M.D.   On: 01/30/2018 11:19   Dg Chest 2 View  Result Date: 02/14/2018 CLINICAL DATA:  58 year old female with acute onset difficulty speaking. Breast cancer. Multiple small embolic brain infarcts last month. EXAM: CHEST - 2 VIEW COMPARISON:  CTA head and neck earlier today. Portable chest 01/30/2018. FINDINGS: Moderate right pleural effusion appears mildly increased from earlier this month. Associated right lung base opacification. Mediastinal contours remain within normal limits. No pneumothorax or pulmonary edema. The left lung appears clear. No acute osseous abnormality identified. Negative visible bowel gas pattern. IMPRESSION: Moderate right pleural effusion appears increased from earlier this month. Electronically Signed   By: Genevie Ann M.D.   On: 02/14/2018 21:39   Ct Head Wo Contrast  Result Date: 01/26/2018 CLINICAL DATA:  Acute onset of altered level of consciousness. Dizziness and double vision. Eyes drifting to the right. EXAM: CT HEAD WITHOUT CONTRAST TECHNIQUE: Contiguous axial images were obtained from the base of the skull through the vertex without intravenous contrast. COMPARISON:  MRI of the brain performed 11/28/2017 FINDINGS: Brain: No evidence of acute infarction, hemorrhage, hydrocephalus, extra-axial collection or mass lesion/mass effect. The posterior fossa, including the cerebellum, brainstem and fourth ventricle, is within normal limits. The third and lateral ventricles, and basal ganglia are unremarkable in appearance. The cerebral hemispheres are symmetric in appearance, with normal gray-white  differentiation. No mass effect or midline shift is seen. Vascular: No hyperdense vessel or unexpected calcification. Skull: There is no evidence of fracture; visualized osseous structures are unremarkable in appearance. Sinuses/Orbits: There is deviation of gaze to the right. The orbits are otherwise unremarkable. The paranasal sinuses and mastoid air cells are well-aerated. Other: No significant soft tissue abnormalities are seen. IMPRESSION: 1. No acute intracranial pathology seen on CT. 2. Deviation of gaze to the right, as clinically described. Electronically Signed   By: Garald Balding M.D.   On: 01/26/2018 01:56   Ct Angio Neck W And/or Wo Contrast  Result Date: 02/14/2018 CLINICAL DATA:  Stroke.  Aphasia EXAM: CT ANGIOGRAPHY HEAD AND NECK TECHNIQUE: Multidetector CT imaging of the head and neck was performed using the standard protocol during bolus administration of intravenous contrast. Multiplanar CT image reconstructions and MIPs were obtained to evaluate the vascular anatomy. Carotid stenosis measurements (when applicable) are obtained utilizing NASCET criteria, using the distal internal carotid diameter as the denominator. CONTRAST:  173m ISOVUE-370 IOPAMIDOL (ISOVUE-370) INJECTION 76% COMPARISON:  CT head 02/14/2018 FINDINGS: CTA NECK FINDINGS Aortic arch: Standard branching. Imaged portion shows no evidence of aneurysm or dissection.  No significant stenosis of the major arch vessel origins. Right carotid system: Negative for stenosis or atherosclerotic disease Left carotid system: Negative for stenosis or atherosclerotic disease Vertebral arteries: Both vertebral arteries widely patent without stenosis. Skeleton: Cervical spondylosis.  No acute skeletal abnormality. Other neck: Negative for mass or adenopathy. Upper chest: Moderately large right effusion. Peri carina adenopathy. Anterior mediastinal adenopathy. Review of the MIP images confirms the above findings CTA HEAD FINDINGS Anterior  circulation: Cavernous carotid widely patent bilaterally. Anterior and middle cerebral arteries widely patent bilaterally. Posterior circulation: Both vertebral arteries patent to the basilar. PICA patent bilaterally. Superior cerebellar and posterior cerebral arteries widely patent bilaterally. Fetal origin left posterior cerebral artery. Venous sinuses: Patent Anatomic variants: None Delayed phase: Normal enhancement on delayed imaging. Hypodensity in the corona radiata on the right unchanged. Review of the MIP images confirms the above findings IMPRESSION: 1. Negative for emergent large vessel occlusion. 2. No significant carotid or vertebral artery stenosis. No significant intracranial stenosis. Electronically Signed   By: Franchot Gallo M.D.   On: 02/14/2018 20:20   Ct Angio Neck W Or Wo Contrast  Result Date: 01/27/2018 CLINICAL DATA:  Stroke follow-up EXAM: CT ANGIOGRAPHY HEAD AND NECK TECHNIQUE: Multidetector CT imaging of the head and neck was performed using the standard protocol during bolus administration of intravenous contrast. Multiplanar CT image reconstructions and MIPs were obtained to evaluate the vascular anatomy. Carotid stenosis measurements (when applicable) are obtained utilizing NASCET criteria, using the distal internal carotid diameter as the denominator. CONTRAST:  151m ISOVUE-370 IOPAMIDOL (ISOVUE-370) INJECTION 76% COMPARISON:  Head CT and brain MRI from yesterday FINDINGS: CTA NECK FINDINGS Aortic arch: Normal appearance Right carotid system: Vessels are smooth and widely patent Left carotid system: Vessels are smooth and widely patent. No noted atheromatous changes Vertebral arteries: Vessels are smooth and widely patent. Early origin left PICA which branches from the V3 segment. Skeleton: Degenerative disease without acute finding Other neck: Enlarged lymph nodes in the low left posterior triangle and supraclavicular fossa. Incidental right thyroid nodule Upper chest: There is  worrisome, centrally low-dense adenopathy in the bilateral mediastinum, thickest at the level of the subcarinal space where there is a 2 cm diameter. Layering right pleural effusion, at least moderate. There is subpleural nodularity and both upper lobes. Review of the MIP images confirms the above findings CTA HEAD FINDINGS Anterior circulation: Vessels are smooth and widely patent Posterior circulation: Vessels are smooth and widely patent. Hypoplastic left P1 segment Venous sinuses: Patent Anatomic variants: As above Delayed phase: No abnormal intracranial enhancement Review of the MIP images confirms the above findings IMPRESSION: 1. Malignant-appearing adenopathy in the chest and left lower neck presumably related patient's history of breast cancer. Bilateral upper lobe subpleural nodularity suggesting lymphatic spread of tumor. Moderate right pleural effusion. 2. No significant vascular finding. Electronically Signed   By: JMonte FantasiaM.D.   On: 01/27/2018 09:59   Ct Chest W Contrast  Result Date: 01/28/2018 CLINICAL DATA:  History of breast cancer. Evaluate for metastatic disease. EXAM: CT CHEST, ABDOMEN, AND PELVIS WITH CONTRAST TECHNIQUE: Multidetector CT imaging of the chest, abdomen and pelvis was performed following the standard protocol during bolus administration of intravenous contrast. CONTRAST:  1025mOMNIPAQUE IOHEXOL 300 MG/ML  SOLN COMPARISON:  Chest CT 05/13/2016; abdomen pelvic CT 05/31/2017 FINDINGS: CT CHEST FINDINGS Cardiovascular: Normal heart size.  Trace pericardial effusion. Mediastinum/Nodes: Interval development of mediastinal and right hilar adenopathy. Reference 2.1 cm right paratracheal lymph node (image 17; series 3). Reference 2.5  cm subcarinal node (image 25; series 3). Reference 2.6 x 4.2 cm right pericardiophrenic mass (image 31; series 3). There is a 1.5 cm left supraclavicular lymph node (image 7; series 3). Lungs/Pleura: Irregular right hilar soft tissue measures 3.4  x 3.2 cm compatible with right hilar adenopathy. This markedly narrows the right middle and lower lobe bronchi. New moderate right pleural effusion. Possible enhancing pleural nodularity within the inferior right pleural space (image 41; series 3). Interval development of multiple bilateral pulmonary nodules. Reference 1.4 cm nodule in the lingula (image 91; series 5). Reference 0.4 cm nodule within the left lower lobe (image 104; series 5). Multiple right upper lobe subpleural nodules measuring up to 5 mm (image 46; series 5). No pneumothorax. Musculoskeletal: Thoracic spine degenerative changes. No aggressive or acute appearing osseous lesions. Postsurgical changes left breast. CT ABDOMEN PELVIS FINDINGS Hepatobiliary: Interval development of a 5.2 x 4.2 cm low-attenuation mass within the right hepatic lobe (image 41; series 3). There is an adjacent 1.5 cm low-attenuation nodule (image 39; series 3). New 2.5 x 1.6 cm low-attenuation mass right hepatic lobe (image 51; series 3). Additional small low-attenuation lesions are demonstrated within the liver. Gallbladder is unremarkable. No intrahepatic or extrahepatic biliary ductal dilatation. Pancreas: Unremarkable Spleen: Interval development of a 2.2 x 1.8 cm low-attenuation mass within the spleen (image 52; series 3). Adrenals/Urinary Tract: Stable small left adrenal nodule. Small right adrenal myelolipoma. Kidneys enhance symmetrically with contrast. No hydronephrosis. Streak artifact limits evaluation of the pelvis. Stomach/Bowel: Re demonstrated circumferential wall thickening of the sigmoid colon and rectum. Small pericolonic lymph nodes are demonstrated. Normal appendix. Vascular/Lymphatic: Normal caliber abdominal aorta. No retroperitoneal lymphadenopathy. Reproductive: Streak artifact limits evaluation of the pelvis. Other: None. Musculoskeletal: Bilateral hip arthroplasties. Stable patchy sclerosis left ilium lumbar spine degenerative changes. No aggressive  or acute appearing osseous lesions. IMPRESSION: 1. Interval development of metastatic disease including mediastinal and right hilar adenopathy, bilateral pulmonary nodules, right pleural effusion with suggestion of peritoneal nodularity, hepatic metastatic disease and splenic metastatic disease. 2. Re demonstrated wall thickening of the sigmoid colon and rectum which may be secondary to an inflammatory process. Electronically Signed   By: Lovey Newcomer M.D.   On: 01/28/2018 16:53   Mr Brain Wo Contrast  Result Date: 01/26/2018 CLINICAL DATA:  Blurred vision and difficulty walking EXAM: MRI HEAD WITHOUT CONTRAST TECHNIQUE: Multiplanar, multiecho pulse sequences of the brain and surrounding structures were obtained without intravenous contrast. COMPARISON:  Head CT from earlier today FINDINGS: Brain: Subcentimeter foci of restricted diffusion along the left para median floor of the fourth ventricle (near the facial colliculus), right cerebellum, bilateral occipital cortex, and bilateral posterior frontal cortex, and right centrum semiovale. No prior ischemic injury is noted. No hemorrhage, hydrocephalus, or masslike finding Vascular: Major flow voids are preserved Skull and upper cervical spine: Negative for marrow lesion Sinuses/Orbits: Negative IMPRESSION: Small scattered acute infarcts in the bilateral cerebrum, right cerebellum, and left posterior pons. The pontine infarct is near the facial colliculus and correlates with the visual complaints. The pattern suggests central embolic disease. Electronically Signed   By: Monte Fantasia M.D.   On: 01/26/2018 12:20   Mr Brain W Contrast  Result Date: 01/28/2018 CLINICAL DATA:  Invasive breast cancer, stage IV, recurrence. Abnormal MRI the brain. Blurred vision and difficulty walking. EXAM: MRI HEAD WITH CONTRAST TECHNIQUE: Multiplanar, multiecho pulse sequences of the brain and surrounding structures were obtained with intravenous contrast. CONTRAST:  8 mL  Gadavist COMPARISON:  MRI brain 01/26/2018. CTA of  the head and neck 01/27/2018 FINDINGS: Brain: A 4 mm enhancing lesion present posteriorly in the right cerebellum on image 17 of series 3. Two punctate foci of enhancement are present in the anterior inferior left cerebellum on image 14 of series 3. Two additional punctate foci are present at the inferior aspect of the cerebellum bilaterally on image 11 of series 3. A punctate enhancing lesion is present at the inferior left temporal lobe on image 22 of series 3. A lesion in the left parietal white matter is best seen on images 36 and 37 of series 3. Cortical and subcortical punctate a lesions are present in the left parietal lobe images 42 and 41. An anterior right frontal lobe 3 mm enhancing nodule is present on image 43 of series 3. A linear cortical lesion measures 5.5 mm in the anterior right frontal lobe on image 41 of series 3. 3 mm lesion is present the anterior right frontal lobe white matter on image 38. A subcortical white matter lesion in the right occipital lobe measures 3 mm on image 32. The foci of enhancement are separate from the areas of restricted diffusion. The ventricles are of normal size. No significant extra-axial fluid collection is present. Vascular: Normal vascular enhancement is present Skull and upper cervical spine: Craniocervical junction is normal. Discrete osseous lesions are present. The upper cervical spine is within normal limits. Sinuses/Orbits: The paranasal sinuses and mastoid air cells are clear. IMPRESSION: 1. At least 14 focal enhancing lesions are present in the cerebrum and cerebellum. The largest lesion is in the anterior right frontal lobe measuring 5.5 mm. These are separate from the previously seen foci of restricted diffusion and likely represent diffuse metastases. 2. No enhancement associated with the areas of restricted diffusion previously seen. These are likely separate areas of embolic infarcts. Electronically  Signed   By: San Morelle M.D.   On: 01/28/2018 11:07   Mr Jeri Cos SE Contrast  Result Date: 02/15/2018 CLINICAL DATA:  Metastatic breast cancer.  Aphasia. EXAM: MRI HEAD WITHOUT AND WITH CONTRAST TECHNIQUE: Multiplanar, multiecho pulse sequences of the brain and surrounding structures were obtained without and with intravenous contrast. CONTRAST:  7.5 mL Gadovist IV COMPARISON:  CT head 02/14/2018, MRI head with contrast 01/18/2018 FINDINGS: Brain: Multiple small enhancing metastatic deposits are seen throughout the brain and cerebellum bilaterally. Most of these are stable since the prior MRI however there is progression in size of enhancing lesion in the left parietal cortex which now measures approximately 4 mm. Multiple areas of restricted diffusion in the brain also have progressed since the prior MRI of 01/26/2018. Many these are separate from the areas of enhancement and are likely due to acute infarct. In particular, left posterior insular restricted diffusion and left medial occipital restricted diffusion are new and are most typical for acute infarct. Metastatic deposits can show restricted diffusion however these are new and different locations from the enhancing lesion suggesting that the patient has had multiple interval infarcts which may be due to emboli or hypercoagulability. Ventricle size is normal. New hypodensity in the right parietal white matter does not enhance. Mild restricted diffusion. Probable subacute infarct. Vascular: Normal arterial flow voids Skull and upper cervical spine: Negative Sinuses/Orbits: Mucosal edema paranasal sinuses.  Negative orbit Other: None IMPRESSION: 1. Multiple enhancing metastatic deposits throughout the brain and cerebellum. Most lesions are stable since the prior study however the left parietal cortical lesion has progressed and now measures 4 mm in diameter. 2. Multiple new areas of  restricted diffusion are seen throughout the cerebral hemispheres  and cerebellum bilaterally suggestive of acute infarct due to emboli or hypercoagulability. Electronically Signed   By: Franchot Gallo M.D.   On: 02/15/2018 12:35   Ct Abdomen Pelvis W Contrast  Result Date: 01/28/2018 CLINICAL DATA:  History of breast cancer. Evaluate for metastatic disease. EXAM: CT CHEST, ABDOMEN, AND PELVIS WITH CONTRAST TECHNIQUE: Multidetector CT imaging of the chest, abdomen and pelvis was performed following the standard protocol during bolus administration of intravenous contrast. CONTRAST:  158m OMNIPAQUE IOHEXOL 300 MG/ML  SOLN COMPARISON:  Chest CT 05/13/2016; abdomen pelvic CT 05/31/2017 FINDINGS: CT CHEST FINDINGS Cardiovascular: Normal heart size.  Trace pericardial effusion. Mediastinum/Nodes: Interval development of mediastinal and right hilar adenopathy. Reference 2.1 cm right paratracheal lymph node (image 17; series 3). Reference 2.5 cm subcarinal node (image 25; series 3). Reference 2.6 x 4.2 cm right pericardiophrenic mass (image 31; series 3). There is a 1.5 cm left supraclavicular lymph node (image 7; series 3). Lungs/Pleura: Irregular right hilar soft tissue measures 3.4 x 3.2 cm compatible with right hilar adenopathy. This markedly narrows the right middle and lower lobe bronchi. New moderate right pleural effusion. Possible enhancing pleural nodularity within the inferior right pleural space (image 41; series 3). Interval development of multiple bilateral pulmonary nodules. Reference 1.4 cm nodule in the lingula (image 91; series 5). Reference 0.4 cm nodule within the left lower lobe (image 104; series 5). Multiple right upper lobe subpleural nodules measuring up to 5 mm (image 46; series 5). No pneumothorax. Musculoskeletal: Thoracic spine degenerative changes. No aggressive or acute appearing osseous lesions. Postsurgical changes left breast. CT ABDOMEN PELVIS FINDINGS Hepatobiliary: Interval development of a 5.2 x 4.2 cm low-attenuation mass within the right  hepatic lobe (image 41; series 3). There is an adjacent 1.5 cm low-attenuation nodule (image 39; series 3). New 2.5 x 1.6 cm low-attenuation mass right hepatic lobe (image 51; series 3). Additional small low-attenuation lesions are demonstrated within the liver. Gallbladder is unremarkable. No intrahepatic or extrahepatic biliary ductal dilatation. Pancreas: Unremarkable Spleen: Interval development of a 2.2 x 1.8 cm low-attenuation mass within the spleen (image 52; series 3). Adrenals/Urinary Tract: Stable small left adrenal nodule. Small right adrenal myelolipoma. Kidneys enhance symmetrically with contrast. No hydronephrosis. Streak artifact limits evaluation of the pelvis. Stomach/Bowel: Re demonstrated circumferential wall thickening of the sigmoid colon and rectum. Small pericolonic lymph nodes are demonstrated. Normal appendix. Vascular/Lymphatic: Normal caliber abdominal aorta. No retroperitoneal lymphadenopathy. Reproductive: Streak artifact limits evaluation of the pelvis. Other: None. Musculoskeletal: Bilateral hip arthroplasties. Stable patchy sclerosis left ilium lumbar spine degenerative changes. No aggressive or acute appearing osseous lesions. IMPRESSION: 1. Interval development of metastatic disease including mediastinal and right hilar adenopathy, bilateral pulmonary nodules, right pleural effusion with suggestion of peritoneal nodularity, hepatic metastatic disease and splenic metastatic disease. 2. Re demonstrated wall thickening of the sigmoid colon and rectum which may be secondary to an inflammatory process. Electronically Signed   By: DLovey NewcomerM.D.   On: 01/28/2018 16:53   [place] Biopsy (lymph Nodes)  Result Date: 01/31/2018 INDICATION: History of breast cancer, now with left supraclavicular lymph reason for metastatic disease. Please from ultrasound-guided biopsy for tissue diagnostic purposes. EXAM: ULTRASOUND-GUIDED LEFT SUPRACLAVICULAR LYMPH NODE BIOPSY COMPARISON:  CT of the  chest, abdomen and pelvis - 01/28/2018 MEDICATIONS: None ANESTHESIA/SEDATION: Moderate (conscious) sedation was employed during this procedure. A total of Versed 2 mg and Fentanyl 100 mcg was administered intravenously. Moderate Sedation Time: 10 minutes. The patient's  level of consciousness and vital signs were monitored continuously by radiology nursing throughout the procedure under my direct supervision. COMPLICATIONS: None immediate. TECHNIQUE: Informed written consent was obtained from the patient after a discussion of the risks, benefits and alternatives to treatment. Questions regarding the procedure were encouraged and answered. Initial ultrasound scanning demonstrated several enlarged left supraclavicular lymph nodes with dominant left supraclavicular lymph node measuring approximately 1.2 cm in greatest diameter (image 2). An ultrasound image was saved for documentation purposes. The procedure was planned. A timeout was performed prior to the initiation of the procedure. The operative was prepped and draped in the usual sterile fashion, and a sterile drape was applied covering the operative field. A timeout was performed prior to the initiation of the procedure. Local anesthesia was provided with 1% lidocaine with epinephrine. Under direct ultrasound guidance, an 18 gauge core needle device was utilized to obtain to obtain 6 core needle biopsies of the dominant left supraclavicular lymph node. The samples were placed in saline and submitted to pathology. The needle was removed and hemostasis was achieved with manual compression. Post procedure scan was negative for significant hematoma. A dressing was placed. The patient tolerated the procedure well without immediate postprocedural complication. IMPRESSION: Technically successful ultrasound guided biopsy of dominant left supraclavicular lymph node. Electronically Signed   By: Sandi Mariscal M.D.   On: 01/31/2018 17:32   Ct Head Code Stroke Wo  Contrast  Result Date: 02/14/2018 CLINICAL DATA:  Code stroke. Initial evaluation for acute speech difficulty. EXAM: CT HEAD WITHOUT CONTRAST TECHNIQUE: Contiguous axial images were obtained from the base of the skull through the vertex without intravenous contrast. COMPARISON:  Prior CT from 01/26/2018. FINDINGS: Brain: 11 mm hypodensity involving the deep white matter of the posterior right centrum semi ovale, likely reflecting sequelae of subacute ischemia, as a small acute infarct was previously seen within this region on prior brain MRI (series 2, image 22). No other evidence for acute or subacute infarct. Gray-white matter differentiation otherwise maintained. No acute intracranial hemorrhage. No mass lesion, midline shift or mass effect. No hydrocephalus. No extra-axial fluid collection. Vascular: No hyperdense vessel. Skull: Scalp soft tissues and calvarium within normal limits. Sinuses/Orbits: Globes and orbital soft tissues within normal limits. Paranasal sinuses and mastoid air cells are clear. Other: None. ASPECTS [facility] Stroke Program Early CT Score) - Ganglionic level infarction (caudate, lentiform nuclei, internal capsule, insula, M1-M3 cortex): 7 - Supraganglionic infarction (M4-M6 cortex): 3 Total score (0-10 with 10 being normal): 10 IMPRESSION: 1. Approximate 1 cm hypodensity involving the deep white matter of the posterior right centrum semi ovale, likely reflecting sequelae of subacute ischemia/interval evolution of previously seen infarct at this location. 2. No other acute intracranial abnormality identified. No hemorrhage. 3. ASPECTS is 10. Critical Value/emergent results were called by telephone at the time of interpretation on 02/14/2018 at 8:08 pm to Dr. Lennice Sites , who verbally acknowledged these results. Electronically Signed   By: Jeannine Boga M.D.   On: 02/14/2018 20:12   Vas [place] Lower Extremity Venous (dvt)  Result Date: 01/27/2018  Lower Venous Study Indications:  Stroke.  Performing Technologist: Abram Sander RVS  Examination Guidelines: A complete evaluation includes B-mode imaging, spectral Doppler, color Doppler, and power Doppler as needed of all accessible portions of each vessel. Bilateral testing is considered an integral part of a complete examination. Limited examinations for reoccurring indications may be performed as noted.  Right Venous Findings: +---------+---------------+---------+-----------+----------+-------+          CompressibilityPhasicitySpontaneityPropertiesSummary +---------+---------------+---------+-----------+----------+-------+  CFV      Full           Yes      Yes                          +---------+---------------+---------+-----------+----------+-------+ SFJ      Full                                                 +---------+---------------+---------+-----------+----------+-------+ FV Prox  Full                                                 +---------+---------------+---------+-----------+----------+-------+ FV Mid   Full                                                 +---------+---------------+---------+-----------+----------+-------+ FV DistalFull                                                 +---------+---------------+---------+-----------+----------+-------+ PFV      Full                                                 +---------+---------------+---------+-----------+----------+-------+ POP      Full           Yes      Yes                          +---------+---------------+---------+-----------+----------+-------+ PTV      Full                                                 +---------+---------------+---------+-----------+----------+-------+ PERO     Full                                                 +---------+---------------+---------+-----------+----------+-------+  Left Venous Findings: +---------+---------------+---------+-----------+----------+-------+           CompressibilityPhasicitySpontaneityPropertiesSummary +---------+---------------+---------+-----------+----------+-------+ CFV      Full           Yes      Yes                          +---------+---------------+---------+-----------+----------+-------+ SFJ      Full                                                 +---------+---------------+---------+-----------+----------+-------+  FV Prox  Full                                                 +---------+---------------+---------+-----------+----------+-------+ FV Mid   Full                                                 +---------+---------------+---------+-----------+----------+-------+ FV DistalFull                                                 +---------+---------------+---------+-----------+----------+-------+ PFV      Full                                                 +---------+---------------+---------+-----------+----------+-------+ POP      Full           Yes      Yes                          +---------+---------------+---------+-----------+----------+-------+ PTV      Full                                                 +---------+---------------+---------+-----------+----------+-------+ PERO     Full                                                 +---------+---------------+---------+-----------+----------+-------+    Summary: Right: There is no evidence of deep vein thrombosis in the lower extremity. No cystic structure found in the popliteal fossa. Left: There is no evidence of deep vein thrombosis in the lower extremity. No cystic structure found in the popliteal fossa.  *See table(s) above for measurements and observations. Electronically signed by Deitra Mayo MD on 01/27/2018 at 5:13:11 PM.    Final    US Thoracentesis Asp Pleural Space W/img Guide  Result Date: 01/30/2018 INDICATION: Patient with history of breast cancer, dyspnea, right pleural effusion. Request  made for diagnostic and therapeutic right thoracentesis. EXAM: ULTRASOUND GUIDED DIAGNOSTIC AND THERAPEUTIC RIGHT THORACENTESIS MEDICATIONS: None COMPLICATIONS: None immediate. PROCEDURE: An ultrasound guided thoracentesis was thoroughly discussed with the patient and questions answered. The benefits, risks, alternatives and complications were also discussed. The patient understands and wishes to proceed with the procedure. Written consent was obtained. Ultrasound was performed to localize and mark an adequate pocket of fluid in the right chest. The area was then prepped and draped in the normal sterile fashion. 1% Lidocaine was used for local anesthesia. Under ultrasound guidance a 6 Fr Safe-T-Centesis catheter was introduced. Thoracentesis was performed. The catheter was removed and a dressing applied. FINDINGS: A total of approximately 500 cc of hazy, amber fluid was removed. Samples were sent  to the laboratory as requested by the clinical team. IMPRESSION: Successful ultrasound guided diagnostic and therapeutic right thoracentesis yielding 500 cc of pleural fluid. Read by: Rowe Robert, PA-C Electronically Signed   By: Markus Daft M.D.   On: 01/30/2018 11:44     Subjective: Patient was watching television at time of exam.  She voices that she is able to speak in full sentences, something she was unable to do previously.  She mentions that she feels well and that she would like to discharge.  She has three doctor's appointments tomorrow.  Discharge Exam: Vitals:   02/16/18 0815 02/16/18 1100  BP: 126/78 111/78  Pulse: 81 88  Resp: 18   Temp: 98 F (36.7 C) 98.1 F (36.7 C)  SpO2: 97% 98%   Vitals:   02/15/18 2359 02/16/18 0409 02/16/18 0815 02/16/18 1100  BP: 132/76 130/74 126/78 111/78  Pulse: 78 80 81 88  Resp: 19 18 18    Temp: 97.9 F (36.6 C) 97.9 F (36.6 C) 98 F (36.7 C) 98.1 F (36.7 C)  TempSrc: Oral Oral Oral Oral  SpO2: 97% 98% 97% 98%  Weight:      Height:         General: Pt is alert, awake, not in acute distress Cardiovascular: RRR, S1/S2 +, no rubs, no gallops Respiratory: CTA bilaterally, no wheezing, no rhonchi Abdominal: Soft, NT, ND, bowel sounds + Extremities: no edema, no cyanosis    The results of significant diagnostics from this hospitalization (including imaging, microbiology, ancillary and laboratory) are listed below for reference.     Microbiology: No results found for this or any previous visit (from the past 240 hour(s)).   Labs: BNP (last 3 results) No results for input(s): BNP in the last 8760 hours. Basic Metabolic Panel: Recent Labs  Lab 02/14/18 1956 02/14/18 2005 02/16/18 0535  NA 138 137 139  K 3.7 3.8 3.8  CL 102 102 105  CO2 24  --  24  GLUCOSE 120* 120* 115*  BUN 21* 19 9  CREATININE 0.94 0.80 0.76  CALCIUM 9.0  --  8.6*   Liver Function Tests: Recent Labs  Lab 02/14/18 1956  AST 27  ALT 24  ALKPHOS 100  BILITOT 1.1  PROT 7.4  ALBUMIN 3.7   No results for input(s): LIPASE, AMYLASE in the last 168 hours. No results for input(s): AMMONIA in the last 168 hours. CBC: Recent Labs  Lab 02/14/18 1956 02/14/18 2005 02/16/18 0535  WBC 10.8*  --  6.7  NEUTROABS 8.0*  --   --   HGB 14.0 15.6* 12.2  HCT 45.1 46.0 37.9  MCV 84.5  --  82.8  PLT 311  --  260   Cardiac Enzymes: Recent Labs  Lab 02/15/18 0650  TROPONINI 0.03*   BNP: Invalid input(s): POCBNP CBG: Recent Labs  Lab 02/15/18 0616 02/15/18 2130 02/16/18 0634  GLUCAP 106* 156* 107*   D-Dimer No results for input(s): DDIMER in the last 72 hours. Hgb A1c Recent Labs    02/15/18 0555  HGBA1C 6.0*   Lipid Profile Recent Labs    02/15/18 0650  CHOL 177  HDL 51  LDLCALC 107*  TRIG 93  CHOLHDL 3.5   Thyroid function studies Recent Labs    02/16/18 0535  TSH 0.269*   Anemia work up No results for input(s): VITAMINB12, FOLATE, FERRITIN, TIBC, IRON, RETICCTPCT in the last 72 hours. Urinalysis    Component  Value Date/Time   COLORURINE YELLOW (A) 02/15/2018 1287  APPEARANCEUR CLEAR (A) 02/15/2018 0629   LABSPEC 1.015 02/15/2018 0629   PHURINE 7.0 02/15/2018 0629   GLUCOSEU NEGATIVE 02/15/2018 0629   HGBUR NEGATIVE 02/15/2018 0629   BILIRUBINUR NEGATIVE 02/15/2018 0629   KETONESUR NEGATIVE 02/15/2018 0629   PROTEINUR NEGATIVE 02/15/2018 0629   UROBILINOGEN 0.2 12/30/2008 1317   NITRITE NEGATIVE 02/15/2018 0629   LEUKOCYTESUR NEGATIVE 02/15/2018 0629   Sepsis Labs Invalid input(s): PROCALCITONIN,  WBC,  LACTICIDVEN Microbiology No results found for this or any previous visit (from the past 240 hour(s)).   Time coordinating discharge: Over 30 minutes  SIGNED:   Loretha Stapler, MD  [facility] 02/16/2018, 1:53 PM Pager [phone] If 7PM-7AM, please contact night-coverage [email or web address] Password [email or web address]

## 2018-02-16 NOTE — Progress Notes (Signed)
Pt given discharge summary and was discharged via family transportation.

## 2018-02-16 NOTE — Progress Notes (Signed)
Physical Therapy Treatment Patient Details Name: Brandi Dickson MRN: [id number] DOB: 1961-01-24 Today's Date: 02/16/2018    History of Present Illness Pt is a 58 y/o F who presents with difficulty speaking and word substitutions. MRI showing multiple enhancing metastatic deposits throughotu the brain and cerebellum. Most lesions are stable since prior study but left parietal cortical lesion has progressed and measures 4 mm in diameter. Multiple new areas of restricted diffusion are seen throughout the cerebral hemispheres and cerebellum bilaterally suggestive of acute infarct. PMH: breast CA, B THA, arthritis, HTN. CT 12/31 + Interval development of metastatic disease including mediastinal and right hilar adenopathy, B pulmonary nodlules, R pleural effusion with suggestion of peritoneal nodularity, hepatic metastatic disease and splenic metastatic desease.     PT Comments    Patient seen for mobility progression. Pt continues to present with balance deficits and requires at least single UE support for ambulation. Pt requires min A for short distance gait in room with pt "furniture walking" and min guard assist with use of RW. Pt and pt's husband agreeable that pt will need RW and 24 hour supervision/assist initially upon d/c. Pt reports having RW at home. Continue to recommend outpatient PT for further skilled PT services to maximize independence and safety with mobility.     Follow Up Recommendations  Supervision/Assistance - 24 hour;Outpatient PT     Equipment Recommendations  None recommended by PT    Recommendations for Other Services       Precautions / Restrictions Precautions Precautions: Fall Precaution Comments: diplopia (has taped glasses) Restrictions Weight Bearing Restrictions: No    Mobility  Bed Mobility               General bed mobility comments: Pt up in recliner upon arrival  Transfers Overall transfer level: Needs assistance Equipment used:  None Transfers: Sit to/from Stand Sit to Stand: Min guard         General transfer comment: min guard for safety;  no physical assist to stand  Ambulation/Gait Ambulation/Gait assistance: Min guard Gait Distance (Feet): 220 Feet Assistive device: Rolling walker (2 wheeled) Gait Pattern/deviations: Step-through pattern;Decreased stride length Gait velocity: decreased   General Gait Details: cues for keeping hands on RW and for safety; min guard assist while pt uses bilat UE support; min A for short distance in room and pt reaching for objects to hold onto   Stairs   Stairs assistance: Min guard Stair Management: One rail Right;Step to pattern;Forwards Number of Stairs: 2 General stair comments: unsteady when descending possibly due to visual deficits; no overt LOB    Wheelchair Mobility    Modified Rankin (Stroke Patients Only) Modified Rankin (Stroke Patients Only) Pre-Morbid Rankin Score: Moderate disability Modified Rankin: Moderately severe disability     Balance Overall balance assessment: Needs assistance Sitting-balance support: No upper extremity supported;Feet supported Sitting balance-Leahy Scale: Good     Standing balance support: No upper extremity supported Standing balance-Leahy Scale: Poor Standing balance comment: statically fair; dynamcally poor                            Cognition Arousal/Alertness: Awake/alert Behavior During Therapy: WFL for tasks assessed/performed Overall Cognitive Status: Impaired/Different from baseline Area of Impairment: Safety/judgement                         Safety/Judgement: Decreased awareness of safety;Decreased awareness of deficits     General Comments: says she  can furniture walk safely in house--she may be able to but not sure since we are not in her home environment      Exercises Other Exercises Other Exercises: educated pt and husband on convergence exercise for pt (3x/day only  2-3 minutes each time) as well as working on tracking to left and upward and left and downward    General Comments        Pertinent Vitals/Pain Pain Assessment: No/denies pain    Home Living                      Prior Function            PT Goals (current goals can now be found in the care plan section) Acute Rehab PT Goals Patient Stated Goal: to go home  Progress towards PT goals: Progressing toward goals    Frequency    Min 4X/week      PT Plan Current plan remains appropriate    Co-evaluation              AM-PAC PT "6 Clicks" Mobility   Outcome Measure  Help needed turning from your back to your side while in a flat bed without using bedrails?: None Help needed moving from lying on your back to sitting on the side of a flat bed without using bedrails?: None Help needed moving to and from a bed to a chair (including a wheelchair)?: A Little Help needed standing up from a chair using your arms (e.g., wheelchair or bedside chair)?: None Help needed to walk in hospital room?: A Little Help needed climbing 3-5 steps with a railing? : A Little 6 Click Score: 21    End of Session Equipment Utilized During Treatment: Gait belt Activity Tolerance: Patient tolerated treatment well Patient left: in chair;with call bell/phone within reach;with family/visitor present Nurse Communication: Mobility status PT Visit Diagnosis: Unsteadiness on feet (R26.81);Other abnormalities of gait and mobility (R26.89);Other symptoms and signs involving the nervous system (R29.898)     Time: 3794-3276 PT Time Calculation (min) (ACUTE ONLY): 14 min  Charges:  $Gait Training: 8-22 mins                     Earney Navy, PTA Acute Rehabilitation Services Pager: [phone] Office: [phone]     Darliss Cheney 02/16/2018, 1:43 PM

## 2018-02-16 NOTE — Progress Notes (Signed)
Telemetry had called to inform RN of pt having 12-13 secs long run of SVT during shift. Pt assessed to be asymptomatic; resting comfortably in bed with call light within reach. Pt remained in NSR during remainder of shift with no abnormal rhythm reported. Reported off to oncoming RN. Delia Heady RN

## 2018-02-16 NOTE — Progress Notes (Signed)
STROKE TEAM PROGRESS NOTE   HISTORY OF PRESENT ILLNESS (per record) FARTUN PARADISO is a 58 y.o. female past medical history of metastatic breast cancer with mets to the brain, lungs, recent stroke in December after which she is started on apixaban and aspirin, presented to the emergency room at [facility] for evaluation of difficulty speaking and word substitutions. Reportedly she was having dinner with friends when she started having difficulty forming sentences and words and her speech sounded like a word salad. This had not happened before and it was brought in for concern for stroke.  Telemedicine neurology consultation was obtained which found her symptoms to be only related to a aphasia-NIH 2, hence TPA was not offered along with the fact that she had a stroke about 3 weeks ago. Patient was transferred over to [facility] for further work-up as well as an EEG with the thought that this might be complex partial seizure given multiple brain mets. Patient was very agitated at the time of this exam and did not want to cooperate.  She said that she has not been able to sleep and very tired and part of her problems might be the fact that she has not slept. She cooperated only for a very cursory NIH stroke scale as documented below.   LKW: 1915 hrs. on 02/14/2018 tpa given?: no, symptoms deemed to be too mild-initial evaluation by telemedicine neurology at [facility] Premorbid modified Rankin scale (mRS):0   SUBJECTIVE (INTERVAL HISTORY) Her husband is at the bedside.  Had a very long discussion with husband and patient.  Embolic strokes likely due to hypercoagulability secondary to cancer however patient has never had a TEE.  When discussed with her patient responds "hell no".  Patient would like to go home today.  We will discharge her on Lovenox and she sees her oncologist and radiation oncologist tomorrow and she would like to further discuss next steps with  them.    OBJECTIVE Vitals:   02/15/18 2027 02/15/18 2359 02/16/18 0409 02/16/18 0815  BP: 126/62 132/76 130/74 126/78  Pulse: (!) 52 78 80 81  Resp: (!) 22 19 18 18   Temp: 98 F (36.7 C) 97.9 F (36.6 C) 97.9 F (36.6 C) 98 F (36.7 C)  TempSrc: Oral Oral Oral Oral  SpO2: 98% 97% 98% 97%  Weight:      Height:        CBC:  Recent Labs  Lab 02/14/18 1956 02/14/18 2005 02/16/18 0535  WBC 10.8*  --  6.7  NEUTROABS 8.0*  --   --   HGB 14.0 15.6* 12.2  HCT 45.1 46.0 37.9  MCV 84.5  --  82.8  PLT 311  --  683    Basic Metabolic Panel:  Recent Labs  Lab 02/14/18 1956 02/14/18 2005 02/16/18 0535  NA 138 137 139  K 3.7 3.8 3.8  CL 102 102 105  CO2 24  --  24  GLUCOSE 120* 120* 115*  BUN 21* 19 9  CREATININE 0.94 0.80 0.76  CALCIUM 9.0  --  8.6*    Lipid Panel:     Component Value Date/Time   CHOL 177 02/15/2018 0650   TRIG 93 02/15/2018 0650   HDL 51 02/15/2018 0650   CHOLHDL 3.5 02/15/2018 0650   VLDL 19 02/15/2018 0650   LDLCALC 107 (H) 02/15/2018 0650   HgbA1c:  Lab Results  Component Value Date   HGBA1C 6.0 (H) 02/15/2018   Urine Drug Screen:  Component Value Date/Time   LABOPIA NONE DETECTED 11/28/2017 2059   COCAINSCRNUR NONE DETECTED 11/28/2017 2059   LABBENZ NONE DETECTED 11/28/2017 2059   AMPHETMU NONE DETECTED 11/28/2017 2059   THCU NONE DETECTED 11/28/2017 2059   LABBARB NONE DETECTED 11/28/2017 2059    Alcohol Level     Component Value Date/Time   ETH <10 11/28/2017 2101    IMAGING   Ct Angio Head W Or Wo Contrast Ct Angio Neck W And/or Wo Contrast 02/14/2018 IMPRESSION:  1. Negative for emergent large vessel occlusion.  2. No significant carotid or vertebral artery stenosis. No significant intracranial stenosis.    Dg Chest 2 View 02/14/2018 IMPRESSION:  Moderate right pleural effusion appears increased from earlier this month.   Mr Jeri Cos Wo Contrast 02/15/2018 IMPRESSION:  1. Multiple enhancing metastatic  deposits throughout the brain and cerebellum. Most lesions are stable since the prior study however the left parietal cortical lesion has progressed and now measures 4 mm in diameter.  2. Multiple new areas of restricted diffusion are seen throughout the cerebral hemispheres and cerebellum bilaterally suggestive of acute infarct due to emboli or hypercoagulability.    Ct Head Code Stroke Wo Contrast 02/14/2018 IMPRESSION:  1. Approximate 1 cm hypodensity involving the deep white matter of the posterior right centrum semi ovale, likely reflecting sequelae of subacute ischemia/interval evolution of previously seen infarct at this location.  2. No other acute intracranial abnormality identified. No hemorrhage.  3. ASPECTS is 10.     Transthoracic Echocardiogram  01/27/2018 Study Conclusions - Left ventricle: The cavity size was normal. Wall thickness was   normal. Systolic function was normal. The estimated ejection   fraction was in the range of 60% to 65%. Wall motion was normal;   there were no regional wall motion abnormalities. Doppler   parameters are consistent with abnormal left ventricular   relaxation (grade 1 diastolic dysfunction). - Mitral valve: There was trivial regurgitation. Impressions: - No cardiac source of emboli was indentified.    Bilateral LE Dopplers  01/27/2018 Summary: Right: There is no evidence of deep vein thrombosis in the lower extremity. No cystic structure found in the popliteal fossa. Left: There is no evidence of deep vein thrombosis in the lower extremity. No cystic structure found in the popliteal fossa.   EEG 02/15/2017 Clinical Interpretation: This normal EEG is recorded in the waking and drowsy state. There was no seizure or seizure predisposition recorded on this study. Please note that lack of epileptiform activity on EEG does not preclude the possibility of epilepsy.    PHYSICAL EXAM Blood pressure 126/78, pulse 81, temperature 98 F  (36.7 C), temperature source Oral, resp. rate 18, height 5\' 5"  (1.651 m), weight 77.7 kg, SpO2 97 %.   Exam: NAD Speech:    Speech is normal; fluent and spontaneous with normal comprehension.  Cognition:    The patient is oriented to person, place, and time;     recent and remote memory intact;     language fluent;    Cranial Nerves:    The pupils are equal, round, and reactive to light.  Isa tropia of the left eye cannot cross the midline's.  Trigeminal sensation is intact and the muscles of mastication are normal. The face is symmetric. The palate elevates in the midline. Hearing intact. Voice is normal. Shoulder shrug is normal. The tongue has normal motion without fasciculations.   Coordination:  No dysmetria  Motor Observation:    No asymmetry, no atrophy, and no  involuntary movements noted. Tone:    Normal muscle tone.     Strength:    Strength is V/V in the upper and lower limbs.      Sensation: intact to LT       ASSESSMENT/PLAN Ms. DALINDA HEIDT is a 58 y.o. female with history of asthma, DM, Htn, neuropathy, and metastatic breast cancer with mets to the brain, lungs, recent stroke in December after which she is started on apixaban and aspirin presenting with speech difficulties. She did not receive IV t-PA due to mild deficits and recent stroke.  Stroke: Multiple infarcts - emboli or hypercoagulability - ASA and Eliquis PTA  Resultant aphasia resolved.  CT head - Approximate 1 cm hypodensity involving the deep white matter of the posterior right centrum semi ovale, likely reflecting sequelae of subacute ischemia/interval evolution of previously seen infarct at this location.   MRI head - Multiple new areas of restricted diffusion are seen throughout the cerebral hemispheres and cerebellum bilaterally suggestive of acute infarct due to emboli or hypercoagulability. Multiple enhancing metastatic deposits.  MRA head - not performed  CTA H&N - negative Carotid  Doppler - CTA neck performed - carotid dopplers not indicated.  2D Echo - 01/27/2018 - No cardiac source of emboli was indentified.  EEG - normal EEG is recorded in the waking and drowsy state.  LDL - 107  HgbA1c - 6.0  UDS - not performed  VTE prophylaxis - Lovenox  Diet  - Heart healthy with thin liquids.  aspirin 81 mg daily and Eliquis (apixaban) daily prior to admission, now on aspirin 81 mg daily  Patient counseled to be compliant with her antithrombotic medications  Ongoing aggressive stroke risk factor management  Therapy recommendations:  Outpt OT - No F/U PT recommended  Disposition:  Pending  Hypertension  Stable . Permissive hypertension (OK if < 220/120) but gradually normalize in 5-7 days . Long-term BP goal normotensive  Hyperlipidemia  Lipid lowering medication PTA:  Lipitor 20 mg daily  LDL 107, goal < 70  Current lipid lowering medication: Lipitor 20 mg daily -> will increase to 40 mg daily  Continue statin at discharge  Diabetes  HgbA1c 6.0, goal < 7.0  Controlled  Other Stroke Risk Factors  Former cigarette smoker - quit 15 years ago  Overweight, Body mass index is 28.51 kg/m., recommend weight loss, diet and exercise as appropriate   Hx stroke/TIA  Cancer - hypercoagulable   Other Active Problems  Possible seizure - EEG normal - Keppra started this admission  Metastatic breast cancer with mets to the brain and lungs  PLAN  Spoke by phone to Dr Mickeal Skinner (Neuro Oncology) regarding the patient. He did not feel there was anything else to offer the patient from a pharmacologic aspect; although he did recommend a TEE and loop if not already done. He will update the pt's primary oncologist, Dr Burr Medico, on Monday. Patient declined TEE and loop with "hell no" and was discharged home on Lovenox pending appointments tomorrow with her oncologist and RadOnc.   Hospital day # 1  Personally examined patient and images, and have participated in  and made any corrections needed to history, physical, neuro exam,assessment and plan as stated above.  I have personally obtained the history, evaluated lab date, reviewed imaging studies and agree with radiology interpretations.    Sarina Ill, MD Stroke Neurology   A total of 45 minutes was spent for the care of this patient, spent on counseling patient and family on  different diagnostic and therapeutic options, counseling and coordination of care, riskd ans benefits of management, compliance, or risk factor reduction and education.   To contact Stroke Continuity provider, please refer to [email or web address] After hours, contact General Neurology

## 2018-02-17 ENCOUNTER — Inpatient Hospital Stay: Payer: 59

## 2018-02-17 ENCOUNTER — Emergency Department (HOSPITAL_COMMUNITY)
Admission: EM | Admit: 2018-02-17 | Discharge: 2018-02-17 | Disposition: A | Discharge status: Home / Self Care | Payer: 59 | Attending: Emergency Medicine | Admitting: Emergency Medicine

## 2018-02-17 ENCOUNTER — Emergency Department (HOSPITAL_COMMUNITY): Payer: 59

## 2018-02-17 ENCOUNTER — Inpatient Hospital Stay: Payer: 59 | Admitting: Licensed Clinical Social Worker

## 2018-02-17 ENCOUNTER — Telehealth: Payer: Self-pay | Admitting: Radiation Oncology

## 2018-02-17 ENCOUNTER — Ambulatory Visit: Payer: 59

## 2018-02-17 ENCOUNTER — Encounter (HOSPITAL_COMMUNITY): Payer: Self-pay

## 2018-02-17 DIAGNOSIS — E119 Type 2 diabetes mellitus without complications: Secondary | ICD-10-CM | POA: Insufficient documentation

## 2018-02-17 DIAGNOSIS — Z7982 Long term (current) use of aspirin: Secondary | ICD-10-CM | POA: Insufficient documentation

## 2018-02-17 DIAGNOSIS — I10 Essential (primary) hypertension: Secondary | ICD-10-CM | POA: Insufficient documentation

## 2018-02-17 DIAGNOSIS — Z79899 Other long term (current) drug therapy: Secondary | ICD-10-CM | POA: Insufficient documentation

## 2018-02-17 DIAGNOSIS — R531 Weakness: Secondary | ICD-10-CM | POA: Diagnosis not present

## 2018-02-17 DIAGNOSIS — I6389 Other cerebral infarction: Secondary | ICD-10-CM | POA: Diagnosis not present

## 2018-02-17 DIAGNOSIS — Z87891 Personal history of nicotine dependence: Secondary | ICD-10-CM | POA: Diagnosis not present

## 2018-02-17 DIAGNOSIS — Z7984 Long term (current) use of oral hypoglycemic drugs: Secondary | ICD-10-CM | POA: Diagnosis not present

## 2018-02-17 LAB — DIFFERENTIAL
Abs Immature Granulocytes: 0.06 10*3/uL (ref 0.00–0.07)
Basophils Absolute: 0 10*3/uL (ref 0.0–0.1)
Basophils Relative: 0 %
Eosinophils Absolute: 0 10*3/uL (ref 0.0–0.5)
Eosinophils Relative: 0 %
Immature Granulocytes: 1 %
LYMPHS ABS: 1.2 10*3/uL (ref 0.7–4.0)
Lymphocytes Relative: 16 %
MONOS PCT: 12 %
Monocytes Absolute: 0.8 10*3/uL (ref 0.1–1.0)
Neutro Abs: 5 10*3/uL (ref 1.7–7.7)
Neutrophils Relative %: 71 %

## 2018-02-17 LAB — COMPREHENSIVE METABOLIC PANEL
ALT: 22 U/L (ref 0–44)
AST: 28 U/L (ref 15–41)
Albumin: 2.9 g/dL — ABNORMAL LOW (ref 3.5–5.0)
Alkaline Phosphatase: 95 U/L (ref 38–126)
Anion gap: 11 (ref 5–15)
BUN: 12 mg/dL (ref 6–20)
CO2: 25 mmol/L (ref 22–32)
Calcium: 8.8 mg/dL — ABNORMAL LOW (ref 8.9–10.3)
Chloride: 105 mmol/L (ref 98–111)
Creatinine, Ser: 0.88 mg/dL (ref 0.44–1.00)
GFR calc Af Amer: >60 mL/min (ref >60)
Glucose, Bld: 99 mg/dL (ref 70–99)
Potassium: 3.4 mmol/L — ABNORMAL LOW (ref 3.5–5.1)
Sodium: 141 mmol/L (ref 135–145)
Total Bilirubin: 0.8 mg/dL (ref 0.3–1.2)
Total Protein: 6.3 g/dL — ABNORMAL LOW (ref 6.5–8.1)

## 2018-02-17 LAB — PROTIME-INR
INR: 1.06
Prothrombin Time: 13.8 seconds (ref 11.4–15.2)

## 2018-02-17 LAB — I-STAT BETA HCG BLOOD, ED (MC, WL, AP ONLY): I-stat hCG, quantitative: <5 m[IU]/mL (ref <5)

## 2018-02-17 LAB — CBC
HCT: 40.3 % (ref 36.0–46.0)
Hemoglobin: 12.2 g/dL (ref 12.0–15.0)
MCH: 25.6 pg — ABNORMAL LOW (ref 26.0–34.0)
MCHC: 30.3 g/dL (ref 30.0–36.0)
MCV: 84.5 fL (ref 80.0–100.0)
Platelets: 233 10*3/uL (ref 150–400)
RBC: 4.77 MIL/uL (ref 3.87–5.11)
RDW: 14.4 % (ref 11.5–15.5)
WBC: 7.1 10*3/uL (ref 4.0–10.5)
nRBC: 0 % (ref 0.0–0.2)

## 2018-02-17 LAB — GLUCOSE, CAPILLARY: Glucose-Capillary: 104 mg/dL — ABNORMAL HIGH (ref 70–99)

## 2018-02-17 LAB — I-STAT CHEM 8, ED
BUN: 15 mg/dL (ref 6–20)
Calcium, Ion: 1.15 mmol/L (ref 1.15–1.40)
Chloride: 104 mmol/L (ref 98–111)
Creatinine, Ser: 0.8 mg/dL (ref 0.44–1.00)
Glucose, Bld: 93 mg/dL (ref 70–99)
HCT: 40 % (ref 36.0–46.0)
Hemoglobin: 13.6 g/dL (ref 12.0–15.0)
Potassium: 3.3 mmol/L — ABNORMAL LOW (ref 3.5–5.1)
Sodium: 140 mmol/L (ref 135–145)
TCO2: 27 mmol/L (ref 22–32)

## 2018-02-17 LAB — ETHANOL: Alcohol, Ethyl (B): <10 mg/dL (ref <10)

## 2018-02-17 LAB — I-STAT TROPONIN, ED: Troponin i, poc: 0.01 ng/mL (ref 0.00–0.08)

## 2018-02-17 LAB — APTT: APTT: 34 s (ref 24–36)

## 2018-02-17 NOTE — ED Notes (Signed)
Pt eating a  hogie when I came in to dio stroke swallow she passed the swallow

## 2018-02-17 NOTE — ED Triage Notes (Signed)
Patient arrived by [facility] following episode of right arm weakness this am 0930. Patient has brain tumor and receiving radiation for same. Alert and oriented, no facial droop, mae x 4. States that her right arm feels weak. NAD. MD at bedside

## 2018-02-17 NOTE — ED Notes (Signed)
Pt ambulated with a steady gait. Dr. Tyrone Nine aware.

## 2018-02-17 NOTE — Telephone Encounter (Signed)
Received voicemail message from patient's husband that she had a stroke over the weekend and will not be presenting for radiation treatment today. Informed L1 of these findings.

## 2018-02-17 NOTE — Telephone Encounter (Signed)
Received voicemail message from [employer] company requesting additional information on an insurance claim. Phoned [employer] back reference claim no. [id number] and spoke with Percell Miller. Provided information requested: ICD-10 code, consult date, discharge date and return to work date (out indefinitely due to side effects of 14 brain mets).

## 2018-02-17 NOTE — ED Provider Notes (Signed)
[facility]PART[facility] Provider Note   CSN: [id number] Arrival date & time: 02/17/18  1039     History   Chief Complaint No chief complaint on file.   HPI Brandi Dickson is a 58 y.o. female.  58 yo F with a chief complaint of right upper extremity weakness.  This happened suddenly today while she was at home.  She was recently discharged yesterday from the hospital for a possible stroke.  She had known metastasis to the brain and had some signs that she possibly had an ischemic stroke.  She had follow-up today with her radiation oncologist as well as her neurologist however she did not make it to these appointments due to this sudden worsening symptoms.  She denies prior issue with weakness with his right upper extremity.  Denies head injury or fall.  Denies recent illness cough congestion fevers chills or myalgias.  The history is provided by the patient, the spouse and the EMS personnel.  Illness  Severity:  Mild Onset quality:  Sudden Duration:  2 hours Timing:  Constant Progression:  Unchanged Chronicity:  New Associated symptoms: no chest pain, no congestion, no fever, no headaches, no myalgias, no nausea, no rhinorrhea, no shortness of breath, no vomiting and no wheezing     Past Medical History:  Diagnosis Date  . Arthritis   . Asthma    triggered with [name] perfumes and cigarette smoke  . Cancer ([place])   . Colitis   . Diabetes mellitus without complication ([place])   . History of radiation therapy 11/22/16-01/10/17   left breast 50.4 Gy in 28 fractions, axillary region 45 Gy in 25 fractions, lumpectomy cavity boost 10 Gy tin 5 fractions  . Hypertension   . Neuropathy     Patient Active Problem List   Diagnosis Date Noted  . Stroke ([place]) 02/15/2018  . TIA (transient ischemic attack) 02/14/2018  . Cancer ([place])   . Metastatic malignant neoplasm ([place])   . Goals of care, counseling/discussion   . Brain metastasis ([place]) 01/26/2018   . Stage IV breast cancer in female [facility]) 01/26/2018  . Ulcerative colitis ([place]) 01/26/2018  . Type 2 diabetes mellitus ([place]) 01/26/2018  . Asthma 01/26/2018  . Neuropathy 01/26/2018  . Cerebral embolism with cerebral infarction 01/26/2018  . Ulcerative colitis with rectal bleeding ([place]) 05/31/2017  . Ulcerative colitis with complication ([place]) 09/32/6712  . Lactic acidosis 07/19/2016  . Antineoplastic chemotherapy induced pancytopenia ([place])   . GIB (gastrointestinal bleeding) 06/07/2016  . Hypertension   . Diabetes mellitus without complication ([place])   . Ulcerative colitis without complications ([place]) 45/80/9983  . Hematochezia   . Generalized abdominal pain   . Nausea vomiting and diarrhea   . Diarrhea of presumed infectious origin   . Hyponatremia 05/18/2016  . Hypokalemia 05/18/2016  . Hypomagnesemia 05/18/2016  . Dehydration 05/18/2016  . Blood in stool 05/18/2016  . Thrombocytopenia ([place]) 05/18/2016  . GI bleeding 05/18/2016  . Port catheter in place 05/11/2016  . Breast cancer of upper-outer quadrant of left female breast ([place]) 05/02/2016  . S/P left THA, AA 03/06/2016  . Rectal bleeding 07/14/2015  . LLQ abdominal pain 07/14/2015  . Special screening for malignant neoplasms, colon 07/14/2015  . Loss of weight 07/14/2015  . Family history of colon cancer 07/14/2015    Past Surgical History:  Procedure Laterality Date  . ABDOMINAL HYSTERECTOMY    . BREAST LUMPECTOMY WITH RADIOACTIVE SEED AND SENTINEL LYMPH NODE BIOPSY Left 10/09/2016   Procedure:  LEFT BREAST LUMPECTOMY WITH RADIOACTIVE SEED AND L4EFT AXILLARY SENTINEL LYMPH NODE BIOPSY;  Surgeon: [name], MD;  Location: [facility];  Service: General;  Laterality: Left;  . DILATION AND CURETTAGE OF UTERUS    . KNEE ARTHROSCOPY Left   . PORTACATH PLACEMENT Right 05/08/2016   Procedure: INSERTION PORT-A-CATH WITH [place];  Surgeon: [name], MD;  Location: [facility];  Service: General;   Laterality: Right;  . TONSILLECTOMY    . TOTAL HIP ARTHROPLASTY Right   . TOTAL HIP ARTHROPLASTY Left 03/06/2016   Procedure: LEFT TOTAL HIP ARTHROPLASTY ANTERIOR APPROACH;  Surgeon: [name], MD;  Location: [facility];  Service: Orthopedics;  Laterality: Left;     OB History   No obstetric history on file.      Home Medications    Prior to Admission medications   Medication Sig Start Date End Date Taking? Authorizing Provider  albuterol (PROVENTIL HFA;VENTOLIN HFA) 108 (90 Base) MCG/ACT inhaler Inhale 1-2 puffs into the lungs every 6 (six) hours as needed for wheezing or shortness of breath.   Yes [provider]  anastrozole (ARIMIDEX) 1 MG tablet Take 1 tablet (1 mg total) by mouth daily. 10/08/17  Yes Truitt Merle, MD  aspirin EC 81 MG tablet Take 81 mg by mouth 2 (two) times daily.   Yes [provider]  atorvastatin (LIPITOR) 20 MG tablet Take 1 tablet (20 mg total) by mouth daily. 01/31/18 01/31/19 Yes Georgette Shell, MD  dexamethasone (DECADRON) 4 MG tablet Take 1 tablet (4 mg total) by mouth 2 (two) times daily with a meal. 01/31/18  Yes Tyler Pita, MD  dicyclomine (BENTYL) 20 MG tablet Take 1 tablet (20 mg total) by mouth 4 (four) times daily -  before meals and at bedtime. Patient taking differently: Take 20 mg by mouth 3 (three) times daily as needed for spasms.  06/05/17  Yes Truitt Merle, MD  enoxaparin (LOVENOX) 80 MG/0.8ML injection Inject 0.8 mLs (80 mg total) into the skin every 12 (twelve) hours for 10 days. 02/16/18 02/26/18 Yes Eber Jones, MD  gabapentin (NEURONTIN) 100 MG capsule Take 200 mg by mouth at bedtime.    Yes [provider]  Garlic (GARLIQUE) 947 MG TBEC Take 400 mg by mouth daily.   Yes [provider]  Ginkgo Biloba Extract 120 MG CAPS Take 120 mg by mouth daily with breakfast.   Yes [provider]  levETIRAcetam (KEPPRA) 500 MG tablet Take 1 tablet (500 mg total) by mouth 2 (two) times daily for 30 days.  02/16/18 03/18/18 Yes Eber Jones, MD  loperamide (IMODIUM) 2 MG capsule Take 4 mg by mouth as needed for diarrhea or loose stools.   Yes [provider]  Mesalamine 800 MG TBEC 2 TABLETS BY MOUTH THREE TIMES A DAY Patient taking differently: Take 2 tablets by mouth 2 (two) times daily.  07/26/17  Yes Ladene Artist, MD  metFORMIN (GLUCOPHAGE-XR) 500 MG 24 hr tablet Take 1,000 mg by mouth at bedtime.   Yes [provider]  metoprolol tartrate (LOPRESSOR) 25 MG tablet Take 1 tablet (25 mg total) by mouth 2 (two) times daily. 01/31/18 01/31/19 Yes Georgette Shell, MD  potassium chloride SA (KLOR-CON M20) 20 MEQ tablet Take 1 tablet (20 mEq total) by mouth 2 (two) times daily. Patient taking differently: Take 20 mEq by mouth daily with breakfast.  05/10/17  Yes Truitt Merle, MD    Family History Family History  Problem Relation  Age of Onset  . Colon cancer Father   . Stomach cancer Paternal Uncle   . Stomach cancer Paternal Uncle   . Melanoma Brother   . Thyroid cancer Brother   . Breast cancer Maternal Aunt   . Breast cancer Maternal Aunt   . Breast cancer Cousin     Social History Social History   Tobacco Use  . Smoking status: Former Smoker    Packs/day: 1.00    Years: 29.00    Pack years: 29.00    Types: Cigarettes    Last attempt to quit: 10/27/2002    Years since quitting: 15.3  . Smokeless tobacco: Never Used  Substance Use Topics  . Alcohol use: No    Alcohol/week: 0.0 standard drinks  . Drug use: No     Allergies   Gadavist [gadobutrol]; Lisinopril; Augmentin [amoxicillin-pot clavulanate]; Benadryl [diphenhydramine]; and Losartan potassium   Review of Systems Review of Systems  Constitutional: Negative for chills and fever.  HENT: Negative for congestion and rhinorrhea.   Eyes: Negative for redness and visual disturbance.  Respiratory: Negative for shortness of breath and wheezing.   Cardiovascular: Negative for chest pain and  palpitations.  Gastrointestinal: Negative for nausea and vomiting.  Genitourinary: Negative for dysuria and urgency.  Musculoskeletal: Negative for arthralgias and myalgias.  Skin: Negative for pallor and wound.  Neurological: Positive for weakness Damien Fusi). Negative for dizziness and headaches.     Physical Exam Updated Vital Signs BP 133/84   Pulse 82   Temp 98.3 F (36.8 C) (Oral)   Resp 19   SpO2 97%   Physical Exam Vitals signs and nursing note reviewed.  Constitutional:      General: She is not in acute distress.    Appearance: She is well-developed. She is not diaphoretic.  HENT:     Head: Normocephalic and atraumatic.  Eyes:     Pupils: Pupils are equal, round, and reactive to light.  Neck:     Musculoskeletal: Normal range of motion and neck supple.  Cardiovascular:     Rate and Rhythm: Normal rate and regular rhythm.     Heart sounds: No murmur. No friction rub. No gallop.   Pulmonary:     Effort: Pulmonary effort is normal.     Breath sounds: No wheezing or rales.  Abdominal:     General: There is no distension.     Palpations: Abdomen is soft.     Tenderness: There is no abdominal tenderness.  Musculoskeletal:        General: No tenderness.  Skin:    General: Skin is warm and dry.  Neurological:     Mental Status: She is alert and oriented to person, place, and time.     Comments: 4-5 muscle strength to the right upper extremity.  Left eye has no lateral gaze.  [place] finger-to-nose with the right upper extremity.  No drift to the right upper extremity  Psychiatric:        Behavior: Behavior normal.      ED Treatments / Results  Labs (all labs ordered are listed, but only abnormal results are displayed) Labs Reviewed  CBC - Abnormal; Notable for the following components:      Result Value   MCH 25.6 (*)    All other components within normal limits  COMPREHENSIVE METABOLIC PANEL - Abnormal; Notable for the following components:   Potassium 3.4  (*)    Calcium 8.8 (*)    Total Protein 6.3 (*)  Albumin 2.9 (*)    All other components within normal limits  I-STAT CHEM 8, ED - Abnormal; Notable for the following components:   Potassium 3.3 (*)    All other components within normal limits  ETHANOL  PROTIME-INR  APTT  DIFFERENTIAL  RAPID URINE DRUG SCREEN, HOSP PERFORMED  URINALYSIS, ROUTINE W REFLEX MICROSCOPIC  I-STAT TROPONIN, ED  I-STAT BETA HCG BLOOD, ED ([facility], [facility], [facility] ONLY)    EKG EKG Interpretation  Date/Time:  Monday February 17 2018 10:53:12 EST Ventricular Rate:  85 PR Interval:    QRS Duration: 80 QT Interval:  466 QTC Calculation: 555 R Axis:   77 Text Interpretation:  Sinus rhythm Borderline low voltage, extremity leads Prolonged QT interval No significant change since last tracing Confirmed by Deno Etienne [phone]) on 02/17/2018 11:54:36 AM   Radiology Ct Head Wo Contrast  Result Date: 02/17/2018 CLINICAL DATA:  Right-sided weakness.  Metastatic breast carcinoma EXAM: CT HEAD WITHOUT CONTRAST TECHNIQUE: Contiguous axial images were obtained from the base of the skull through the vertex without intravenous contrast. COMPARISON:  Head CT February 14, 2018 and brain MR February 15, 2018. Breast carcinoma FINDINGS: Brain: The ventricles are normal in size and configuration. No well-defined masses seen on noncontrast enhanced study. The multiple small masses seen on recent MR are not appreciated on this study. There is no hemorrhage, extra-axial fluid collection, or midline shift. Decreased attenuation in the posterior right parietal lobe is stable, likely representing recent infarct. No new parenchymal lesion is appreciable on noncontrast enhanced study. Vascular: No hyperdense vessel. There is no appreciable vascular calcification. Skull: The bony calvarium appears intact. No blastic or lytic bone lesions are evident. Sinuses/Orbits: There are retention cysts in the inferior left maxillary antrum. There is mucosal thickening  in several ethmoid air cells as well as in the left maxillary antrum. Orbits appear symmetric bilaterally. Other: Mastoids on the left are clear. There is opacification in several inferior right-sided mastoid air cells. IMPRESSION: 1. Recent infarct in the right posterior parietal region white matter is again noted without change by noncontrast enhanced CT. Small enhancing mass lesions seen on recent MR are not appreciable by CT. No new foci of edema or apparent acute infarct beyond the lesion in the posterior right parietal region noted on noncontrast enhanced head CT examination. No hemorrhage. 2. There are foci of paranasal sinus disease. There is mild inferior right-sided mastoid air cell disease, stable. Electronically Signed   By: Lowella Grip III M.D.   On: 02/17/2018 12:56    Procedures Procedures (including critical care time)  Medications Ordered in ED Medications - No data to display   Initial Impression / Assessment and Plan / ED Course  I have reviewed the triage vital signs and the nursing notes.  Pertinent labs & imaging results that were available during my care of the patient were reviewed by me and considered in my medical decision making (see chart for details).     58 yo F with a chief complaint of right upper extremity weakness.  This happened acutely a few hours ago.  The patient was recently in the hospital for stroke.  Patient is not a TPA candidate with active metastasis to the brain.  She also is not VAN +.  Will obtain a CT of the head and then discussed the case with neurology.  I discussed the case with Dr. Cheral Marker, neurology as her symptoms had significantly improved he was not sure what we would be able to offer  in the hospital aside from the testing that she had previously declined yesterday.  He recommending having a discussion with her about obtaining those studies.  I discussed this with the patient who at this time would rather discuss it with her  radiation oncologist and family doctor and see what they recommended.  I did offer an MRI to evaluate for new or changed stroke activity.  She would prefer to hold off at this time.  She was able to ambulate around the emergency department without difficulty.  Will discharge home.  3:24 PM:  I have discussed the diagnosis/risks/treatment options with the patient and family and believe the pt to be eligible for discharge home to follow-up with PCP. We also discussed returning to the ED immediately if new or worsening sx occur. We discussed the sx which are most concerning (e.g., sudden worsening pain, fever, inability to tolerate by mouth) that necessitate immediate return. Medications administered to the patient during their visit and any new prescriptions provided to the patient are listed below.  Medications given during this visit Medications - No data to display   The patient appears reasonably screen and/or stabilized for discharge and I doubt any other medical condition or other [facility] requiring further screening, evaluation, or treatment in the ED at this time prior to discharge.    Final Clinical Impressions(s) / ED Diagnoses   Final diagnoses:  Right sided weakness    ED Discharge Orders    None       Deno Etienne, DO 02/17/18 1524

## 2018-02-17 NOTE — ED Notes (Signed)
Pt's IV removed from left [facility], per Patty - RN. IV site was clean, dry and intact.

## 2018-02-17 NOTE — ED Notes (Signed)
Pt up to walk dr Tyrone Nine aware pt doing well

## 2018-02-18 ENCOUNTER — Inpatient Hospital Stay (HOSPITAL_BASED_OUTPATIENT_CLINIC_OR_DEPARTMENT_OTHER): Payer: 59 | Admitting: Hematology

## 2018-02-18 ENCOUNTER — Encounter: Payer: Self-pay | Admitting: Hematology

## 2018-02-18 ENCOUNTER — Inpatient Hospital Stay: Payer: 59

## 2018-02-18 ENCOUNTER — Ambulatory Visit: Payer: 59

## 2018-02-18 ENCOUNTER — Ambulatory Visit
Admission: RE | Admit: 2018-02-18 | Discharge: 2018-02-18 | Disposition: A | Discharge status: Home / Self Care | Payer: 59 | Source: Ambulatory Visit | Attending: Radiation Oncology | Admitting: Radiation Oncology

## 2018-02-18 VITALS — BP 93/79 | HR 80 | Temp 98.5°F | Resp 17 | Ht 65.0 in | Wt 173.2 lb

## 2018-02-18 DIAGNOSIS — Z9181 History of falling: Secondary | ICD-10-CM | POA: Diagnosis not present

## 2018-02-18 DIAGNOSIS — E1142 Type 2 diabetes mellitus with diabetic polyneuropathy: Secondary | ICD-10-CM

## 2018-02-18 DIAGNOSIS — Z79899 Other long term (current) drug therapy: Secondary | ICD-10-CM

## 2018-02-18 DIAGNOSIS — C787 Secondary malignant neoplasm of liver and intrahepatic bile duct: Secondary | ICD-10-CM | POA: Diagnosis not present

## 2018-02-18 DIAGNOSIS — Z923 Personal history of irradiation: Secondary | ICD-10-CM

## 2018-02-18 DIAGNOSIS — Z5111 Encounter for antineoplastic chemotherapy: Secondary | ICD-10-CM | POA: Diagnosis present

## 2018-02-18 DIAGNOSIS — C50412 Malignant neoplasm of upper-outer quadrant of left female breast: Secondary | ICD-10-CM

## 2018-02-18 DIAGNOSIS — Z171 Estrogen receptor negative status [ER-]: Secondary | ICD-10-CM | POA: Diagnosis not present

## 2018-02-18 DIAGNOSIS — M1990 Unspecified osteoarthritis, unspecified site: Secondary | ICD-10-CM | POA: Diagnosis not present

## 2018-02-18 DIAGNOSIS — C778 Secondary and unspecified malignant neoplasm of lymph nodes of multiple regions: Secondary | ICD-10-CM | POA: Diagnosis not present

## 2018-02-18 DIAGNOSIS — K5190 Ulcerative colitis, unspecified, without complications: Secondary | ICD-10-CM

## 2018-02-18 DIAGNOSIS — I639 Cerebral infarction, unspecified: Secondary | ICD-10-CM

## 2018-02-18 DIAGNOSIS — E119 Type 2 diabetes mellitus without complications: Secondary | ICD-10-CM

## 2018-02-18 DIAGNOSIS — K51919 Ulcerative colitis, unspecified with unspecified complications: Secondary | ICD-10-CM | POA: Diagnosis not present

## 2018-02-18 DIAGNOSIS — H532 Diplopia: Secondary | ICD-10-CM | POA: Diagnosis not present

## 2018-02-18 DIAGNOSIS — Z7901 Long term (current) use of anticoagulants: Secondary | ICD-10-CM | POA: Diagnosis not present

## 2018-02-18 DIAGNOSIS — I10 Essential (primary) hypertension: Secondary | ICD-10-CM

## 2018-02-18 DIAGNOSIS — Z5112 Encounter for antineoplastic immunotherapy: Secondary | ICD-10-CM | POA: Diagnosis not present

## 2018-02-18 DIAGNOSIS — Z79811 Long term (current) use of aromatase inhibitors: Secondary | ICD-10-CM | POA: Diagnosis not present

## 2018-02-18 DIAGNOSIS — Z9221 Personal history of antineoplastic chemotherapy: Secondary | ICD-10-CM

## 2018-02-18 DIAGNOSIS — E860 Dehydration: Secondary | ICD-10-CM | POA: Diagnosis not present

## 2018-02-18 DIAGNOSIS — C782 Secondary malignant neoplasm of pleura: Secondary | ICD-10-CM

## 2018-02-18 DIAGNOSIS — C7931 Secondary malignant neoplasm of brain: Secondary | ICD-10-CM

## 2018-02-18 DIAGNOSIS — I6340 Cerebral infarction due to embolism of unspecified cerebral artery: Secondary | ICD-10-CM

## 2018-02-18 LAB — COMPREHENSIVE METABOLIC PANEL
ALT: 24 U/L (ref 0–44)
AST: 28 U/L (ref 15–41)
Albumin: 3.1 g/dL — ABNORMAL LOW (ref 3.5–5.0)
Alkaline Phosphatase: 123 U/L (ref 38–126)
Anion gap: 10 (ref 5–15)
BUN: 13 mg/dL (ref 6–20)
CO2: 25 mmol/L (ref 22–32)
Calcium: 9.4 mg/dL (ref 8.9–10.3)
Chloride: 103 mmol/L (ref 98–111)
Creatinine, Ser: 0.86 mg/dL (ref 0.44–1.00)
GFR calc Af Amer: >60 mL/min (ref >60)
GFR calc non Af Amer: >60 mL/min (ref >60)
Glucose, Bld: 105 mg/dL — ABNORMAL HIGH (ref 70–99)
Potassium: 3.9 mmol/L (ref 3.5–5.1)
Sodium: 138 mmol/L (ref 135–145)
Total Bilirubin: 0.6 mg/dL (ref 0.3–1.2)
Total Protein: 7.1 g/dL (ref 6.5–8.1)

## 2018-02-18 LAB — CBC WITH DIFFERENTIAL/PLATELET
Abs Immature Granulocytes: 0.05 10*3/uL (ref 0.00–0.07)
Basophils Absolute: 0 10*3/uL (ref 0.0–0.1)
Basophils Relative: 0 %
Eosinophils Absolute: 0.1 10*3/uL (ref 0.0–0.5)
Eosinophils Relative: 1 %
HCT: 42.4 % (ref 36.0–46.0)
Hemoglobin: 13.4 g/dL (ref 12.0–15.0)
Immature Granulocytes: 1 %
Lymphocytes Relative: 14 %
Lymphs Abs: 1.1 10*3/uL (ref 0.7–4.0)
MCH: 26 pg (ref 26.0–34.0)
MCHC: 31.6 g/dL (ref 30.0–36.0)
MCV: 82.2 fL (ref 80.0–100.0)
Monocytes Absolute: 0.8 10*3/uL (ref 0.1–1.0)
Monocytes Relative: 10 %
Neutro Abs: 5.7 10*3/uL (ref 1.7–7.7)
Neutrophils Relative %: 74 %
Platelets: 270 10*3/uL (ref 150–400)
RBC: 5.16 MIL/uL — ABNORMAL HIGH (ref 3.87–5.11)
RDW: 14.5 % (ref 11.5–15.5)
WBC: 7.7 10*3/uL (ref 4.0–10.5)
nRBC: 0 % (ref 0.0–0.2)

## 2018-02-18 MED ORDER — ENOXAPARIN SODIUM 80 MG/0.8ML ~~LOC~~ SOLN: 1.0000 mg/kg | Freq: Two times a day (BID) | SUBCUTANEOUS | 0 refills | Status: DC

## 2018-02-19 ENCOUNTER — Telehealth: Payer: Self-pay | Admitting: Hematology

## 2018-02-19 ENCOUNTER — Ambulatory Visit
Admission: RE | Admit: 2018-02-19 | Discharge: 2018-02-19 | Disposition: A | Discharge status: Home / Self Care | Payer: 59 | Source: Ambulatory Visit | Attending: Radiation Oncology | Admitting: Radiation Oncology

## 2018-02-19 DIAGNOSIS — C50412 Malignant neoplasm of upper-outer quadrant of left female breast: Secondary | ICD-10-CM | POA: Diagnosis not present

## 2018-02-19 DIAGNOSIS — C7931 Secondary malignant neoplasm of brain: Secondary | ICD-10-CM | POA: Diagnosis not present

## 2018-02-19 DIAGNOSIS — Z5112 Encounter for antineoplastic immunotherapy: Secondary | ICD-10-CM | POA: Diagnosis not present

## 2018-02-19 NOTE — Telephone Encounter (Signed)
Scheduled appts per 01/21 los. Rescheduled genetic counselor appt.  Patient aware of appts.  Per patient request, printed and mailed calendar.

## 2018-02-21 ENCOUNTER — Inpatient Hospital Stay: Payer: 59

## 2018-02-21 ENCOUNTER — Encounter: Payer: Self-pay | Admitting: Radiation Oncology

## 2018-02-21 VITALS — BP 126/78 | HR 69 | Temp 98.0°F | Resp 18

## 2018-02-21 DIAGNOSIS — Z5112 Encounter for antineoplastic immunotherapy: Secondary | ICD-10-CM | POA: Diagnosis not present

## 2018-02-21 DIAGNOSIS — C50412 Malignant neoplasm of upper-outer quadrant of left female breast: Secondary | ICD-10-CM

## 2018-02-21 DIAGNOSIS — Z171 Estrogen receptor negative status [ER-]: Principal | ICD-10-CM

## 2018-02-21 MED ORDER — LORATADINE 10 MG PO TABS
10.0000 mg | ORAL_TABLET | Freq: Once | ORAL | Status: AC
  Administered 2018-02-21: 10 mg via ORAL

## 2018-02-21 MED ORDER — ACETAMINOPHEN 325 MG PO TABS
ORAL_TABLET | ORAL | Status: AC
  Filled 2018-02-21: qty 2

## 2018-02-21 MED ORDER — SODIUM CHLORIDE 0.9 % IV SOLN
Freq: Once | INTRAVENOUS | Status: AC
  Administered 2018-02-21: 08:00:00 via INTRAVENOUS
  Filled 2018-02-21: qty 250

## 2018-02-21 MED ORDER — ACETAMINOPHEN 325 MG PO TABS
650.0000 mg | ORAL_TABLET | Freq: Once | ORAL | Status: AC
  Administered 2018-02-21: 650 mg via ORAL

## 2018-02-21 MED ORDER — SODIUM CHLORIDE 0.9 % IV SOLN
3.6000 mg/kg | Freq: Once | INTRAVENOUS | Status: AC
  Administered 2018-02-21: 260 mg via INTRAVENOUS
  Filled 2018-02-21: qty 5

## 2018-02-21 MED ORDER — FAMOTIDINE IN NACL 20-0.9 MG/50ML-% IV SOLN
20.0000 mg | Freq: Once | INTRAVENOUS | Status: AC
  Administered 2018-02-21: 20 mg via INTRAVENOUS

## 2018-02-21 MED ORDER — FAMOTIDINE IN NACL 20-0.9 MG/50ML-% IV SOLN
INTRAVENOUS | Status: AC
  Filled 2018-02-21: qty 50

## 2018-02-21 MED ORDER — LORATADINE 10 MG PO TABS
ORAL_TABLET | ORAL | Status: AC
  Filled 2018-02-21: qty 1

## 2018-02-21 NOTE — Patient Instructions (Signed)
[facility] Discharge Instructions for Patients Receiving Chemotherapy  Today you received the following chemotherapy agents Kadcyla  To help prevent nausea and vomiting after your treatment, we encourage you to take your nausea medication as prescribed.   If you develop nausea and vomiting that is not controlled by your nausea medication, call the clinic.   BELOW ARE SYMPTOMS THAT SHOULD BE REPORTED IMMEDIATELY:  *FEVER GREATER THAN 100.5 F  *CHILLS WITH OR WITHOUT FEVER  NAUSEA AND VOMITING THAT IS NOT CONTROLLED WITH YOUR NAUSEA MEDICATION  *UNUSUAL SHORTNESS OF BREATH  *UNUSUAL BRUISING OR BLEEDING  TENDERNESS IN MOUTH AND THROAT WITH OR WITHOUT PRESENCE OF ULCERS  *URINARY PROBLEMS  *BOWEL PROBLEMS  UNUSUAL RASH Items with * indicate a potential emergency and should be followed up as soon as possible.  Feel free to call the clinic should you have any questions or concerns. The clinic phone number is [phone].  Please show the [place] at check-in to the Emergency Department and triage nurse.  Kadcyla Ado-Trastuzumab Emtansine for injection What is this medicine? ADO-TRASTUZUMAB EMTANSINE (ADD oh traz TOO zuh mab em TAN zine) is a monoclonal antibody combined with chemotherapy. It is used to treat breast cancer. This medicine may be used for other purposes; ask your health care provider or pharmacist if you have questions. COMMON BRAND NAME(S): Kadcyla What should I tell my health care provider before I take this medicine? They need to know if you have any of these conditions: -heart disease -heart failure -infection (especially a virus infection such as chickenpox, cold sores, or herpes) -liver disease -lung or breathing disease, like asthma -an unusual or allergic reaction to ado-trastuzumab emtansine, other medications, foods, dyes, or preservatives -pregnant or trying to get pregnant -breast-feeding How should I use this  medicine? This medicine is for infusion into a vein. It is given by a health care professional in a hospital or clinic setting. Talk to your pediatrician regarding the use of this medicine in children. Special care may be needed. Overdosage: If you think you have taken too much of this medicine contact a poison control center or emergency room at once. NOTE: This medicine is only for you. Do not share this medicine with others. What if I miss a dose? It is important not to miss your dose. Call your doctor or health care professional if you are unable to keep an appointment. What may interact with this medicine? This medicine may also interact with the following medications: -atazanavir -boceprevir -clarithromycin -delavirdine -indinavir -dalfopristin; quinupristin -isoniazid, INH -itraconazole -ketoconazole -nefazodone -nelfinavir -ritonavir -telaprevir -telithromycin -tipranavir -voriconazole This list may not describe all possible interactions. Give your health care provider a list of all the medicines, herbs, non-prescription drugs, or dietary supplements you use. Also tell them if you smoke, drink alcohol, or use illegal drugs. Some items may interact with your medicine. What should I watch for while using this medicine? Visit your doctor for checks on your progress. This drug may make you feel generally unwell. This is not uncommon, as chemotherapy can affect healthy cells as well as cancer cells. Report any side effects. Continue your course of treatment even though you feel ill unless your doctor tells you to stop. You may need blood work done while you are taking this medicine. Call your doctor or health care professional for advice if you get a fever, chills or sore throat, or other symptoms of a cold or flu. Do not treat yourself. This drug decreases your  body's ability to fight infections. Try to avoid being around people who are sick. Be careful brushing and flossing your  teeth or using a toothpick because you may get an infection or bleed more easily. If you have any dental work done, tell your dentist you are receiving this medicine. Avoid taking products that contain aspirin, acetaminophen, ibuprofen, naproxen, or ketoprofen unless instructed by your doctor. These medicines may hide a fever. Do not become pregnant while taking this medicine or for 7 months after stopping it, men with female partners should use contraception during treatment and for 4 months after the last dose. Women should inform their doctor if they wish to become pregnant or think they might be pregnant. There is a potential for serious side effects to an unborn child. Do not breast-feed an infant while taking this medicine or for 7 months after the last dose. Men who have a partner who is pregnant or who is capable of becoming pregnant should use a condom during sexual activity while taking this medicine and for 4 months after stopping it. Men should inform their doctors if they wish to father a child. This medicine may lower sperm counts. Talk to your health care professional or pharmacist for more information. What side effects may I notice from receiving this medicine? Side effects that you should report to your doctor or health care professional as soon as possible: -allergic reactions like skin rash, itching or hives, swelling of the face, lips, or tongue -breathing problems -chest pain or palpitations -fever or chills, sore throat -general ill feeling or flu-like symptoms -light-colored stools -nausea, vomiting -pain, tingling, numbness in the hands or feet -signs and symptoms of bleeding such as bloody or black, tarry stools; red or dark-brown urine; spitting up blood or brown material that looks like coffee grounds; red spots on the skin; unusual bruising or bleeding from the eye, gums, or nose -swelling of the legs or ankles -yellowing of the eyes or skin Side effects that usually do  not require medical attention (report to your doctor or health care professional if they continue or are bothersome): -changes in taste -constipation -dizziness -headache -joint pain -muscle pain -trouble sleeping -unusually weak or tired This list may not describe all possible side effects. Call your doctor for medical advice about side effects. You may report side effects to FDA at 1-800-FDA-1088. Where should I keep my medicine? This drug is given in a hospital or clinic and will not be stored at home. NOTE: This sheet is a summary. It may not cover all possible information. If you have questions about this medicine, talk to your doctor, pharmacist, or health care provider.  2019 Elsevier/Gold Standard (2015-03-07 12:11:06)

## 2018-02-21 NOTE — Progress Notes (Signed)
°  Radiation Oncology         [phone] ________________________________  Name: Brandi Dickson MRN: [id number]  Date: 02/21/2018  DOB: [birth year]  End of Treatment Note  Diagnosis:   58 y.o.woman with at least 14 brain metastasesfrom IDC of the left breast UOQ     Indication for treatment:  Palliative       Radiation treatment dates:   01/30/2018 - 02/19/2018  Site/dose:   The whole brain was treated to 35 Gy in 14 fractions of 2.5 Gy  Beams/energy:   Right and Left radiation fields were treated using 6 MV X-rays with custom MLC collimation to shield the eyes and face.  The patient was immobilized with a thermoplastic mask and isocenter was verified with weekly port films.  Narrative: The patient tolerated radiation treatment relatively well. She experienced blurred vision, double vision, left peripheral issues prior to beginning radiation that improved with treatments. She denied auditory changes, fine motor skill changes, and gait changes. She reported occasional mild headaches and some memory issues.  Plan: The patient has completed radiation treatment. The patient will return to radiation oncology clinic for routine followup in one month. I advised them to call or return sooner if they have any questions or concerns related to their recovery or treatment. ________________________________  Sheral Apley. Tammi Klippel, M.D.  This document serves as a record of services personally performed by Tyler Pita, MD. It was created on his behalf by Wilburn Mylar, a trained medical scribe. The creation of this record is based on the scribe's personal observations and the provider's statements to them. This document has been checked and approved by the attending provider.

## 2018-02-21 NOTE — Progress Notes (Signed)
02/21/18  Patient reports Benadryl allergy, not given with treatment plan.  Spoke with [name], DC benadryl, replace in treatment plan with Claritin 10 mg po and Pepcid 20 mg IVPB.  V.O. Dr. Lavonda Jumbo, PharmD

## 2018-02-24 NOTE — Progress Notes (Signed)
[place]   Telephone:([phone]) [phone] Fax:([phone]) [phone]   Clinic Follow up Note   Patient Care Team: Brandi Melter, MD as PCP - General (Family Medicine) Brandi Overall, MD as Consulting Physician (General Surgery) Brandi Merle, MD as Consulting Physician (Hematology) Brandi Pray, MD as Consulting Physician (Radiation Oncology) Brandi Artist, MD as Consulting Physician (Gastroenterology) Brandi Phlegm, NP as Nurse Practitioner (Hematology and Oncology) 02/25/2018  CHIEF COMPLAINT: F/u on left breast cancer   SUMMARY OF ONCOLOGIC HISTORY: Oncology History   Cancer Staging Breast cancer of upper-outer quadrant of left female breast [facility]) Staging form: Breast, AJCC 8th Edition - Clinical stage from 04/25/2016: Stage IIA (cT2, cN0, cM0, G3, ER: Negative, PR: Negative, HER2: Positive) - Signed by Brandi Merle, MD on 05/02/2016 - Pathologic stage from 10/09/2016: No Stage Recommended (ypT2, pN1a, cM0, G3, ER+, PR-, HER2+) - Signed by Brandi Merle, MD on 07/11/2017       Breast cancer of upper-outer quadrant of left female breast ([place])   04/24/2016 Mammogram    Category B breasts with a new irregular mass in the UOQ left breast middle depth. Ultrasound revealed a 3.9 cm mass in the 1:00 position. The left axilla was negative.     04/25/2016 Initial Biopsy    Biopsy of the left breast showed grade 3 invasive ductal carcinoma, DCIS, and lymphovascular invasion was present.    04/25/2016 Receptors her2    ER 0% negative, PR 0% negative, HER2 positive, Ki67 30%    05/02/2016 Initial Diagnosis    Breast cancer of upper-outer quadrant of left female breast ([place])    05/07/2016 Imaging    MRI of the bilateral breast 05/07/16 IMPRESSION: Lobulated enhancing mass (4.0 x 2.7 x 3.1 cm) in the upper-outer quadrant of the left breast corresponding with the recently diagnosed invasive mammary carcinoma. Linear enhancement extends 2.6 cm posterior to the mass worrisome for ductal  carcinoma in-situ.    05/09/2016 Echocardiogram    Echo 05/09/16 -LF EF: 55-60%    05/11/2016 - 08/31/2016 Neo-Adjuvant Chemotherapy    Cycle 1 Docetaxel, Carboplatin, Herceptin and pejeta (TCHP), cycle 2 Docetaxel changed to Taxol, carbo dose reduced to AUC 4.5 (from 5) due to severe diarrhea and cytopenia, Perjeta held after cycle 2.   Due to poor toleration, Brandi Dickson was held after cycle 4 and Taxol changed to weekly X4. Herceptin was continued during the above chemo.      05/13/2016 Imaging    CT Angio Chest PE IMPRESSION: No evidence of pulmonary emboli. Left breast mass consistent with the given clinical history. Stable left adrenal lesion likely representing a small adenoma.    05/18/2016 - 05/24/2016 Hospital Admission    Patient presented with nausea, vomiting, and diarrhea; admitted to hospital with Hyponatremia    06/07/2016 - 06/09/2016 Hospital Admission    Patient presents to hospital complaints of rectal bleeding and abdominal cramps when defacating    06/07/2016 Imaging    CT ABDOMEN PELVIS W CONTRAST  IMPRESSION: 1. Wall thickening of the descending and sigmoid colon consistent with an infectious or inflammatory colitis. 2. There is also a short segment of narrowing and possible wall thickening of the superior ascending colon which could reflect a constricting mass or, more likely, be an area of persistent colonic spasm. This could be further assessed with either colonoscopy or a barium enema after the current symptoms of colitis have resolved. 3. Small, subcentimeter, low-density liver lesions which may all be benign. However, 3 these are not evident on prior  CT. Liver metastatic disease possible. These could be further assessed with liver MRI with and without contrast. 4. Adrenal lesions which are stable, on the right and myelolipoma and on the left most likely an adenoma.    06/07/2016 Imaging    CT A/P IMPRESSION: 1. Wall thickening of the descending and sigmoid  colon consistent with an infectious or inflammatory colitis. 2. There is also a short segment of narrowing and possible wall thickening of the superior ascending colon which could reflect a constricting mass or, more likely, be an area of persistent colonic spasm. This could be further assessed with either colonoscopy or a barium enema after the current symptoms of colitis have resolved. 3. Small, subcentimeter, low-density liver lesions which may all be benign. However, 3 these are not evident on prior CT. Liver metastatic disease possible. These could be further assessed with liver MRI with and without contrast. 4. Adrenal lesions which are stable, on the right and myelolipoma and on the left most likely an adenoma.    06/07/2016 - 06/09/2016 Hospital Admission    Diarrhea and rectal Bleeding    07/19/2016 - 07/20/2016 Hospital Admission    Diarrhea and dehydration    08/09/2016 Mammogram    Mammogram 08/09/16 IMPRESSION:  The 3.1 cm x 2 cm x 1.5 cm irregular equal density mass in the left breast is consistent with the known carcinoma showing mammographic evidence of preoperative chemotherapy response.       08/09/2016 Imaging    [place] of left breast 08/09/16 IMPRESSION:  The 2.7 cm lobulated mass in the left breast is a known biopsy positive for malignancy. Surgical and oncology consult in progress.    08/16/2016 Imaging    MRI Abdomen W WO Contrast IMPRESSION: Tiny sub-cm hepatic cysts. No evidence of metastatic disease or other acute findings. Tiny benign left adrenal adenoma and right adrenal myelolipoma.    09/14/2016 - 05/10/2017 Chemotherapy    Maintenance Herceptin every 3 weeks. Added Perjeta back on 10/26/16. Completed on 05/10/17    09/17/2016 Imaging    MRI Breast Bilateral 09/17/16 IMPRESSION: Smaller left breast mass, now measuring 2.5 cm. No new or suspicious enhancement in either breast.  RECOMMENDATION: Treatment plan.    10/09/2016 Surgery    LEFT BREAST  LUMPECTOMY WITH RADIOACTIVE SEED AND L4EFT AXILLARY SENTINEL LYMPH NODE BIOPSY by Dr. Lucia Dickson on 10/09/16    10/09/2016 Pathology Results    Diagnosis 10/09/16 1. Breast, lumpectomy, Left - INVASIVE DUCTAL CARCINOMA, GRADE 3, SPANNING 2.4 CM. - HIGH GRADE DUCTAL CARCINOMA IN SITU WITH NECROSIS. - RESECTION MARGINS ARE NEGATIVE FOR CARCINOMA. - BIOPSY SITE. - SEE ONCOLOGY TABLE. 2. Breast, excision, Left additional medial margin - FIBROCYSTIC CHANGE. - NO MALIGNANCY IDENTIFIED. 3. Lymph node, sentinel, biopsy, Left Axillary - METASTATIC CARCINOMA IN ONE OF ONE LYMPH NODES (1/1).      11/22/2016 - 01/10/2017 Radiation Therapy    Radiation with Dr. Sondra Come and complete on 01/10/17    11/22/2016 - 05/15/2017 Chemotherapy    Xeloda With radiation she will be on low dose 4 tablets in the am and 3 tablets in the pm. Starting 11/22/16 and complete on 01/10/17.   Will continue Xeloda, 14 days on and 7 days off, 4 tablets BID starting 01/18/17 and completed on 05/15/17.  -Due to severe diarrhea, her Xeloda dose was decreased from 2000 mg twice daily to 2027m in am and 15051min pm on 02/11/17.      03/12/2017 Echocardiogram    ECHO 03/12/17 Impressions: - Normal LV  size with EF 55-60%. Strain as above. Normal RV size   and systolic function. No significant valvular abnormalities.    04/18/2017 Mammogram    IMPRESSION:  New post treatment changes of the left breast. There is no mammographic evidence of malignancy. A follow-up mammogram in 12 months is recommended.     06/10/2017 Echocardiogram    ECHO 06/10/2017 LV EF: 55% -   60%    06/2017 - 12/2017 Anti-estrogen oral therapy    Anastrozole 78m daily, starting 06/2017. Stopped in 12/2017 due to cancer recurrence    07/20/2017 Imaging    DEXA Scan WNLs    07/20/2017 Imaging    DEXA Scan with T-score of 0.4    12/2017 Relapse/Recurrence    During hospitalization for stroke, her workup showed evidence of diffuse metastatic disease from  previous breast cancer. Cervical LN Biopsy confimed this.     01/27/2018 Imaging    CT Angio Head WO Contrast 01/27/18  IMPRESSION: 1. Malignant-appearing adenopathy in the chest and left lower neck presumably related patient's history of breast cancer. Bilateral upper lobe subpleural nodularity suggesting lymphatic spread of tumor. Moderate right pleural effusion. 2. No significant vascular finding.    01/28/2018 Imaging    MRI BRain W Contrast 01/28/18  IMPRESSION: 1. At least 14 focal enhancing lesions are present in the cerebrum and cerebellum. The largest lesion is in the anterior right frontal lobe measuring 5.5 mm. These are separate from the previously seen foci of restricted diffusion and likely represent diffuse metastases. 2. No enhancement associated with the areas of restricted diffusion previously seen. These are likely separate areas of embolic infarcts.    01/28/2018 Imaging    CT CAP W Contast 01/28/18  IMPRESSION: 1. Interval development of metastatic disease including mediastinal and right hilar adenopathy, bilateral pulmonary nodules, right pleural effusion with suggestion of peritoneal nodularity, hepatic metastatic disease and splenic metastatic disease. 2. Re demonstrated wall thickening of the sigmoid colon and rectum which may be secondary to an inflammatory process.    01/30/2018 Pathology Results    Thoracentesis Cytology 01/30/18 Diagnosis PLEURAL FLUID, RIGHT (SPECIMEN 1 OF 1 COLLECTED 01/30/18): MALIGNANT CELLS CONSISTENT WITH METASTATIC ADENOCARCINOMA.    01/30/2018 -  Radiation Therapy    RT with Dr. MTammi Klippelon 01/30/18 and plans to complete on 02/18/18    01/31/2018 Pathology Results    Diagnosis 01/31/18 Lymph node, needle/core biopsy, cervical - METASTATIC CARCINOMA CONSISTENT WITH BREAST PRIMARY. Microscopic Comment The carcinoma with immunohistochemistry is positive with GATA-3 and negative with ER, PR and GCDFP. The morphology and immunophenotype  are consistent with metastatic breast carcinoma.    02/20/2018 Pathology Results    Pathology 02/20/2018 ADDITIONAL INFORMATION: By immunohistochemistry, the tumor cells are NEGATIVE for Her2 (1+).    02/21/2018 -  Chemotherapy    The patient had ado-trastuzumab emtansine (KADCYLA) 260 mg in sodium chloride 0.9 % 250 mL chemo infusion, 3.6 mg/kg = 260 mg, Intravenous, Once, 1 of 5 cycles Administration: 260 mg (02/21/2018)  for chemotherapy treatment.      CURRENT THERAPY  Kadcyla every 3 weeks started 02/21/18 Anastrozole since 06/2017   INTERVAL HISTORY: Brandi PENDELLis a 58y.o. female who is here for follow-up. She completed radiation on 02/19/2017.  Today, she is here with her husband. She recently fell after last treatment and bruised he right arm, hand, and leg. She fell while changing her clothes. She took Tramadol for the pain associated with the recent fall. She can walk. She feels tired. She  vomited after eating and feels nauseated. She says that her taste had changed and she no longer enjoys some foods. She also reported mid abdominal pain that started this morning. She reports normal BM. No dysuria, blood is stool, or fever. She has a dry cough. She uses a walker when she walks, and presents today on a wheelchair. She had mild tingling in her fingers, but denies numbness or dropping objects. She gets confused easily.   Pertinent positives and negatives of review of systems are listed and detailed within the above HPI.  REVIEW OF SYSTEMS:   Constitutional: Denies fevers, chills or abnormal weight loss (+) nausea and vomiting (+) fatigue (+) confusion Eyes: Denies blurriness of vision Ears, nose, mouth, throat, and face: Denies mucositis or sore throat Respiratory: Denies cough, dyspnea or wheezes Cardiovascular: Denies palpitation, chest discomfort or lower extremity swelling Gastrointestinal:  Denies nausea, heartburn or change in bowel habits (+) mid abdominal pain,  started this morning Skin: Denies abnormal skin rashes (+) bruises on her right side of the body due to recent fall Lymphatics: Denies new lymphadenopathy or easy bruising Neurological:Denies numbness or new weaknesses (+) mild tingling Behavioral/Psych: Mood is stable, no new changes  All other systems were reviewed with the patient and are negative.  MEDICAL HISTORY:  Past Medical History:  Diagnosis Date  . Arthritis   . Asthma    triggered with [name] perfumes and cigarette smoke  . Cancer ([place])   . Colitis   . Diabetes mellitus without complication ([place])   . History of radiation therapy 11/22/16-01/10/17   left breast 50.4 Gy in 28 fractions, axillary region 45 Gy in 25 fractions, lumpectomy cavity boost 10 Gy tin 5 fractions  . Hypertension   . Neuropathy     SURGICAL HISTORY: Past Surgical History:  Procedure Laterality Date  . ABDOMINAL HYSTERECTOMY    . BREAST LUMPECTOMY WITH RADIOACTIVE SEED AND SENTINEL LYMPH NODE BIOPSY Left 10/09/2016   Procedure: LEFT BREAST LUMPECTOMY WITH RADIOACTIVE SEED AND L4EFT AXILLARY SENTINEL LYMPH NODE BIOPSY;  Surgeon: Brandi Overall, MD;  Location: [facility];  Service: General;  Laterality: Left;  . DILATION AND CURETTAGE OF UTERUS    . KNEE ARTHROSCOPY Left   . PORTACATH PLACEMENT Right 05/08/2016   Procedure: INSERTION PORT-A-CATH WITH [place];  Surgeon: Brandi Overall, MD;  Location: [facility];  Service: General;  Laterality: Right;  . TONSILLECTOMY    . TOTAL HIP ARTHROPLASTY Right   . TOTAL HIP ARTHROPLASTY Left 03/06/2016   Procedure: LEFT TOTAL HIP ARTHROPLASTY ANTERIOR APPROACH;  Surgeon: [name], MD;  Location: [facility];  Service: Orthopedics;  Laterality: Left;    I have reviewed the social history and family history with the patient and they are unchanged from previous note.  ALLERGIES:  is allergic to gadavist [gadobutrol]; lisinopril; augmentin [amoxicillin-pot clavulanate]; benadryl  [diphenhydramine]; and losartan potassium.  MEDICATIONS:  Current Outpatient Medications  Medication Sig Dispense Refill  . anastrozole (ARIMIDEX) 1 MG tablet Take 1 tablet (1 mg total) by mouth daily. 30 tablet 5  . aspirin EC 81 MG tablet Take 81 mg by mouth 2 (two) times daily.    [name] Kitchen atorvastatin (LIPITOR) 20 MG tablet Take 1 tablet (20 mg total) by mouth daily. 30 tablet 1  . dexamethasone (DECADRON) 4 MG tablet Take 1 tablet (4 mg total) by mouth 2 (two) times daily with a meal. 40 tablet 2  . enoxaparin (LOVENOX) 80 MG/0.8ML injection Inject 0.8 mLs (80 mg total) into  the skin every 12 (twelve) hours for 30 days. 48 mL 0  . gabapentin (NEURONTIN) 100 MG capsule Take 200 mg by mouth at bedtime.     . Garlic (GARLIQUE) 007 MG TBEC Take 400 mg by mouth daily.    . Ginkgo Biloba Extract 120 MG CAPS Take 120 mg by mouth daily with breakfast.    . levETIRAcetam (KEPPRA) 500 MG tablet Take 1 tablet (500 mg total) by mouth 2 (two) times daily for 30 days. 60 tablet 0  . Mesalamine 800 MG TBEC 2 TABLETS BY MOUTH THREE TIMES A DAY (Patient taking differently: Take 2 tablets by mouth 2 (two) times daily. ) 180 tablet 2  . metFORMIN (GLUCOPHAGE-XR) 500 MG 24 hr tablet Take 1,000 mg by mouth at bedtime.    . metoprolol tartrate (LOPRESSOR) 25 MG tablet Take 1 tablet (25 mg total) by mouth 2 (two) times daily. 60 tablet 11  . potassium chloride SA (KLOR-CON M20) 20 MEQ tablet Take 1 tablet (20 mEq total) by mouth 2 (two) times daily. (Patient taking differently: Take 20 mEq by mouth daily with breakfast. ) 60 tablet 2  . albuterol (PROVENTIL HFA;VENTOLIN HFA) 108 (90 Base) MCG/ACT inhaler Inhale 1-2 puffs into the lungs every 6 (six) hours as needed for wheezing or shortness of breath.    . dicyclomine (BENTYL) 20 MG tablet Take 1 tablet (20 mg total) by mouth 4 (four) times daily -  before meals and at bedtime. (Patient not taking: Reported on 02/25/2018) 30 tablet 0  . loperamide (IMODIUM) 2 MG capsule  Take 4 mg by mouth as needed for diarrhea or loose stools.    . traMADol (ULTRAM) 50 MG tablet      No current facility-administered medications for this visit.    Facility-Administered Medications Ordered in Other Visits  Medication Dose Route Frequency Provider Last Rate Last Dose  . 0.9 %  sodium chloride infusion   Intravenous Continuous Brandi Merle, MD      . heparin lock flush 100 unit/mL  500 Units Intracatheter Once PRN Brandi Merle, MD      . sodium chloride flush (NS) 0.9 % injection 10 mL  10 mL Intracatheter PRN Brandi Merle, MD        PHYSICAL EXAMINATION: ECOG PERFORMANCE STATUS: 3 - Symptomatic, >50% confined to bed  Vitals:   02/25/18 1300  BP: 112/86  Pulse: 85  Resp: 18  SpO2: 99%   Filed Weights    GENERAL:alert, no distress and comfortable (+) on a wheelchair  SKIN: skin color, texture, turgor are normal, no rashes or significant lesions (+) bruises on her right hand, arm, and leg EYES: normal, Conjunctiva are pink and non-injected, sclera clear OROPHARYNX:no exudate, no erythema and lips, buccal mucosa, and tongue normal  NECK: supple, thyroid normal size, non-tender, without nodularity LYMPH:  no palpable lymphadenopathy in the cervical, axillary or inguinal LUNGS: clear to auscultation and percussion with normal breathing effort HEART: regular rate & rhythm and no murmurs and no lower extremity edema ABDOMEN:abdomen soft, non-tender, (+) sluggish bowel sounds Musculoskeletal:no cyanosis of digits and no clubbing (+) right sided body pain when she tries to stand up NEURO: alert & oriented x 3 with fluent speech, (+)left eye abduction palsy.  no focal motor/sensory deficits  LABORATORY DATA:  I have reviewed the data as listed CBC Latest Ref Rng & Units 02/25/2018 02/18/2018 02/17/2018  WBC 4.0 - 10.5 K/uL 11.6(H) 7.7 -  Hemoglobin 12.0 - 15.0 g/dL 11.7(L) 13.4 13.6  Hematocrit  36.0 - 46.0 % 36.8 42.4 40.0  Platelets 150 - 400 K/uL 189 270 -     CMP Latest Ref  Rng & Units 02/25/2018 02/18/2018 02/17/2018  Glucose 70 - 99 mg/dL 153(H) 105(H) 93  BUN 6 - 20 mg/dL 24(H) 13 15  Creatinine 0.44 - 1.00 mg/dL 1.17(H) 0.86 0.80  Sodium 135 - 145 mmol/L 135 138 140  Potassium 3.5 - 5.1 mmol/L 4.2 3.9 3.3(L)  Chloride 98 - 111 mmol/L 98 103 104  CO2 22 - 32 mmol/L 22 25 -  Calcium 8.9 - 10.3 mg/dL 10.0 9.4 -  Total Protein 6.5 - 8.1 g/dL 7.5 7.1 -  Total Bilirubin 0.3 - 1.2 mg/dL 0.6 0.6 -  Alkaline Phos 38 - 126 U/L 158(H) 123 -  AST 15 - 41 U/L 73(H) 28 -  ALT 0 - 44 U/L 58(H) 24 -   Pathology 02/20/2018 ADDITIONAL INFORMATION: By immunohistochemistry, the tumor cells are NEGATIVE for Her2 (1+).  RADIOGRAPHIC STUDIES: I have personally reviewed the radiological images as listed and agreed with the findings in the report. No results found.   ASSESSMENT & PLAN:  Brandi Dickson is a 58 y.o. female with history of  1. Breast cancer of upper-outer quadrant of left breast, invasive ductal carcinoma, stage IIA (cT2N0M0) grade 3, ER-, PR-, HER2 amplified, ypT2N1a, ER30% weakly+, PR-, HER2+, Metastatic to brain, liver, pleuraand nodes in 12/2017, ER-/PR-/HER2- -initially diagnosed with stage IIA left breast cancer in 03/2016. Treated with neoadjuvant TCHP,  left breast lumpectomy, adjuvant chemoRT and Xeloda and one year Herceptin and Perjeta maintenance therapy. She was also started on adjuvant Anastrozole -Unfortunately she developed recurrent metastatic breast cancer in brain, liver, pleura and lymph nodes in December 2019.  Lymph node biopsy was negative for ER and PR, HER-2 came back last weekend which was negative too.  Her pleural effusion cytology was also tested for HER-2 which was negative -I have started her on Kadcyla last week, due to the negative HER-2, I will stop it.  -We discussed that triple negative breast cancer is more aggressive, and the treatment is mainly chemotherapy, I will test her tumor for PDL 1, to see if she is a candidate for  immunotherapy.  I have requested her tumor to be tested for Foundation One last week, result is still pending  -Labs reviewed, CBC pending. CMP showed BG 153 BUN 24 Cr 1.17 AST 73 ALT 58 ALK 158 Albumin 3.3 GFR 51. -She is dehydrated and is not eating well. I discussed giving IVF today. She agrees. -She recently sustained a fall and bruised the right side of her body. She has multiple bruises on the right arm and leg. She also reports not eating well due to taste changes, nausea and vomiting.  -she will also have a genetic testing done.  -I plan to switch to Abraxane weekly. If her tumor is PD-L1(+), will add Tecentriq -She knows to call for any concerns   2. Type 2 Diabetes mellitus, HTN -f/u with PCP  3. Ulcerative colitis and chronic diarrhea -Currently on Mesalamine. Continue and f/u with Dr. Fuller Plan. -Plan to repeat colonoscopy this year -Diarrhea is controlled with imodium. Continue.  4. Arthritis -She knows that Anastrozole can worsen her arthritis. -f/u with PCP and monitor  5. Peripheral Neuropathy, secondary toDM and worsened withchemotherapy - Currently on Gabapentin 200 mg bedtime and Vitamin B12. Continue. Will adjust Gabapentin dose as needed. -She denies numbness. She has mild tingling. She denies dropping objects.  6.Bilateral multifocal stroke,  embolic pattern, recurrent  -Residual blurred and double vision. -She was initially put on Eliquis twice daily, due to her recurrent stroke, it was switched to Lovenox 80 mg twice daily, will continue -She is compliant with Lovenox injection twice daily. She denies bleeding or new neurological symptoms.   7. Right Pleural Effusion -Last thoracocentesis was in 01/2017. Will likely reoccur due to pleural metastasis. Will monitor.  8.Goal of care discussion  -We again discussed the incurable nature of her cancer, and the Dickson poor prognosis, especially if she does not have good response to chemotherapy or progress on  chemo -The patient understands the goal of care is palliative. -I recommend DNR/DNI, she will think about it   Plan  -will give her IVF NS 1057m over 2 hours today  -I will see if we can move her genetic testing to an earlier date  -f/u in 2 weeks, will stop Kadcyla, plan to switch to weekly Abraxane  -FO and PD-L1 tests pending   No problem-specific Assessment & Plan notes found for this encounter.   No orders of the defined types were placed in this encounter.  All questions were answered. The patient knows to call the clinic with any problems, questions or concerns. No barriers to learning was detected. I spent 30 minutes counseling the patient face to face. The total time spent in the appointment was 40 minutes and more than 50% was on counseling and review of test results  I, Noor Dweik am acting as scribe for Dr. YTruitt Dickson  I have reviewed the above documentation for accuracy and completeness, and I agree with the above.     YTruitt Merle MD 02/25/2018

## 2018-02-25 ENCOUNTER — Inpatient Hospital Stay: Payer: 59

## 2018-02-25 ENCOUNTER — Inpatient Hospital Stay (HOSPITAL_BASED_OUTPATIENT_CLINIC_OR_DEPARTMENT_OTHER): Payer: 59 | Admitting: Hematology

## 2018-02-25 ENCOUNTER — Encounter: Payer: Self-pay | Admitting: Hematology

## 2018-02-25 VITALS — BP 112/86 | HR 85 | Resp 18 | Ht 65.0 in

## 2018-02-25 DIAGNOSIS — M1990 Unspecified osteoarthritis, unspecified site: Secondary | ICD-10-CM

## 2018-02-25 DIAGNOSIS — C7931 Secondary malignant neoplasm of brain: Secondary | ICD-10-CM

## 2018-02-25 DIAGNOSIS — C50412 Malignant neoplasm of upper-outer quadrant of left female breast: Secondary | ICD-10-CM | POA: Diagnosis not present

## 2018-02-25 DIAGNOSIS — C782 Secondary malignant neoplasm of pleura: Secondary | ICD-10-CM

## 2018-02-25 DIAGNOSIS — I10 Essential (primary) hypertension: Secondary | ICD-10-CM

## 2018-02-25 DIAGNOSIS — K5190 Ulcerative colitis, unspecified, without complications: Secondary | ICD-10-CM

## 2018-02-25 DIAGNOSIS — E860 Dehydration: Secondary | ICD-10-CM

## 2018-02-25 DIAGNOSIS — Z9181 History of falling: Secondary | ICD-10-CM

## 2018-02-25 DIAGNOSIS — Z9221 Personal history of antineoplastic chemotherapy: Secondary | ICD-10-CM

## 2018-02-25 DIAGNOSIS — Z79811 Long term (current) use of aromatase inhibitors: Secondary | ICD-10-CM

## 2018-02-25 DIAGNOSIS — Z5112 Encounter for antineoplastic immunotherapy: Secondary | ICD-10-CM | POA: Diagnosis not present

## 2018-02-25 DIAGNOSIS — S7011XA Contusion of right thigh, initial encounter: Secondary | ICD-10-CM | POA: Diagnosis not present

## 2018-02-25 DIAGNOSIS — Z7901 Long term (current) use of anticoagulants: Secondary | ICD-10-CM

## 2018-02-25 DIAGNOSIS — K51919 Ulcerative colitis, unspecified with unspecified complications: Secondary | ICD-10-CM

## 2018-02-25 DIAGNOSIS — H532 Diplopia: Secondary | ICD-10-CM

## 2018-02-25 DIAGNOSIS — I639 Cerebral infarction, unspecified: Secondary | ICD-10-CM

## 2018-02-25 DIAGNOSIS — Z171 Estrogen receptor negative status [ER-]: Secondary | ICD-10-CM

## 2018-02-25 DIAGNOSIS — E1142 Type 2 diabetes mellitus with diabetic polyneuropathy: Secondary | ICD-10-CM

## 2018-02-25 DIAGNOSIS — C787 Secondary malignant neoplasm of liver and intrahepatic bile duct: Secondary | ICD-10-CM

## 2018-02-25 DIAGNOSIS — C778 Secondary and unspecified malignant neoplasm of lymph nodes of multiple regions: Secondary | ICD-10-CM

## 2018-02-25 DIAGNOSIS — Z923 Personal history of irradiation: Secondary | ICD-10-CM

## 2018-02-25 DIAGNOSIS — Z79899 Other long term (current) drug therapy: Secondary | ICD-10-CM

## 2018-02-25 LAB — CBC WITH DIFFERENTIAL/PLATELET
Abs Immature Granulocytes: 0.15 10*3/uL — ABNORMAL HIGH (ref 0.00–0.07)
BASOS PCT: 0 %
Basophils Absolute: 0 10*3/uL (ref 0.0–0.1)
Eosinophils Absolute: 0 10*3/uL (ref 0.0–0.5)
Eosinophils Relative: 0 %
HCT: 36.8 % (ref 36.0–46.0)
Hemoglobin: 11.7 g/dL — ABNORMAL LOW (ref 12.0–15.0)
Immature Granulocytes: 1 %
Lymphocytes Relative: 9 %
Lymphs Abs: 1.1 10*3/uL (ref 0.7–4.0)
MCH: 26.2 pg (ref 26.0–34.0)
MCHC: 31.8 g/dL (ref 30.0–36.0)
MCV: 82.3 fL (ref 80.0–100.0)
Monocytes Absolute: 0.8 10*3/uL (ref 0.1–1.0)
Monocytes Relative: 7 %
Neutro Abs: 9.6 10*3/uL — ABNORMAL HIGH (ref 1.7–7.7)
Neutrophils Relative %: 83 %
PLATELETS: 189 10*3/uL (ref 150–400)
RBC: 4.47 MIL/uL (ref 3.87–5.11)
RDW: 14.3 % (ref 11.5–15.5)
WBC: 11.6 10*3/uL — ABNORMAL HIGH (ref 4.0–10.5)
nRBC: 0 % (ref 0.0–0.2)

## 2018-02-25 LAB — COMPREHENSIVE METABOLIC PANEL
ALT: 58 U/L — ABNORMAL HIGH (ref 0–44)
AST: 73 U/L — ABNORMAL HIGH (ref 15–41)
Albumin: 3.3 g/dL — ABNORMAL LOW (ref 3.5–5.0)
Alkaline Phosphatase: 158 U/L — ABNORMAL HIGH (ref 38–126)
Anion gap: 15 (ref 5–15)
BUN: 24 mg/dL — ABNORMAL HIGH (ref 6–20)
CO2: 22 mmol/L (ref 22–32)
Calcium: 10 mg/dL (ref 8.9–10.3)
Chloride: 98 mmol/L (ref 98–111)
Creatinine, Ser: 1.17 mg/dL — ABNORMAL HIGH (ref 0.44–1.00)
GFR calc non Af Amer: 51 mL/min — ABNORMAL LOW (ref >60)
GFR, EST AFRICAN AMERICAN: 59 mL/min — AB (ref >60)
Glucose, Bld: 153 mg/dL — ABNORMAL HIGH (ref 70–99)
Potassium: 4.2 mmol/L (ref 3.5–5.1)
Sodium: 135 mmol/L (ref 135–145)
TOTAL PROTEIN: 7.5 g/dL (ref 6.5–8.1)
Total Bilirubin: 0.6 mg/dL (ref 0.3–1.2)

## 2018-02-25 MED ORDER — SODIUM CHLORIDE 0.9 % IV SOLN
INTRAVENOUS | Status: AC
  Filled 2018-02-25: qty 250

## 2018-02-25 MED ORDER — SODIUM CHLORIDE 0.9 % IV SOLN
INTRAVENOUS | Status: DC
  Filled 2018-02-25: qty 250

## 2018-02-25 MED ORDER — SODIUM CHLORIDE 0.9 % IV SOLN
INTRAVENOUS | Status: AC
  Administered 2018-02-25: 14:00:00 via INTRAVENOUS
  Filled 2018-02-25 (×2): qty 250

## 2018-02-25 MED ORDER — ALUM & MAG HYDROXIDE-SIMETH 200-200-20 MG/5ML PO SUSP
30.0000 mL | Freq: Once | ORAL | Status: AC
  Administered 2018-02-25: 30 mL via ORAL

## 2018-02-25 MED ORDER — ALUM & MAG HYDROXIDE-SIMETH 200-200-20 MG/5ML PO SUSP
ORAL | Status: AC
  Filled 2018-02-25: qty 30

## 2018-02-25 NOTE — Progress Notes (Signed)
DISCONTINUE ON PATHWAY REGIMEN - Breast     A cycle is every 21 days:     Ado-trastuzumab emtansine   **Always confirm dose/schedule in your pharmacy ordering system**  REASON: Other Reason PRIOR TREATMENT: [id number]: Ado-Trastuzumab Emtansine q21 Days Until Progression or Toxicity TREATMENT RESPONSE: Unable to Evaluate  START ON PATHWAY REGIMEN - Breast     A cycle is every 28 days (3 weeks on and 1 week off):     Paclitaxel   **Always confirm dose/schedule in your pharmacy ordering system**  Patient Characteristics: Distant Metastases or Locoregional Recurrent Disease - Unresected or Locally Advanced Unresectable Disease Progressing after Neoadjuvant and Local Therapies, HER2 Negative/Unknown/Equivocal, ER Negative/Unknown, Chemotherapy, First Line, ER Negative,  PD-L1 Expression Negative/Unknown Therapeutic Status: Distant Metastases BRCA Mutation Status: Absent ER Status: Negative (-) HER2 Status: Negative (-) PR Status: Negative (-) Line of Therapy: First Line PD-L1 Expression Status: Awaiting Test Results Intent of Therapy: Non-Curative / Palliative Intent, Discussed with Patient

## 2018-02-26 ENCOUNTER — Telehealth: Payer: Self-pay | Admitting: Hematology

## 2018-02-26 NOTE — Telephone Encounter (Signed)
No los per 01/28.

## 2018-02-27 ENCOUNTER — Telehealth: Payer: Self-pay

## 2018-02-27 ENCOUNTER — Other Ambulatory Visit: Payer: Self-pay | Admitting: Hematology

## 2018-02-27 MED ORDER — OXYCODONE HCL 5 MG PO TABS: 2.5000 mg | ORAL_TABLET | Freq: Four times a day (QID) | ORAL | 0 refills | Status: DC | PRN

## 2018-02-27 NOTE — Telephone Encounter (Signed)
Patient's husband calls patient having increased pain in lower extremities, taking Tramadol not really helping, per [name] she will send in oxycodone to [facility], instructed to start off taking half tablets to see how she does, can cause some sedation, nausea, constipation.  Patient's husband verbalized an understanding.

## 2018-02-28 ENCOUNTER — Encounter (HOSPITAL_COMMUNITY): Payer: Self-pay | Admitting: Hematology

## 2018-03-03 ENCOUNTER — Emergency Department (HOSPITAL_COMMUNITY): Payer: 59

## 2018-03-03 ENCOUNTER — Other Ambulatory Visit: Payer: Self-pay

## 2018-03-03 ENCOUNTER — Encounter: Payer: 59 | Admitting: Licensed Clinical Social Worker

## 2018-03-03 ENCOUNTER — Encounter (HOSPITAL_COMMUNITY): Payer: Self-pay

## 2018-03-03 ENCOUNTER — Inpatient Hospital Stay (HOSPITAL_COMMUNITY)
Admission: EM | Admit: 2018-03-03 | Discharge: 2018-03-05 | DRG: 871 | Disposition: A | Discharge status: Hospice | Payer: 59 | Attending: Internal Medicine | Admitting: Internal Medicine

## 2018-03-03 DIAGNOSIS — Z8673 Personal history of transient ischemic attack (TIA), and cerebral infarction without residual deficits: Secondary | ICD-10-CM | POA: Diagnosis not present

## 2018-03-03 DIAGNOSIS — Z87891 Personal history of nicotine dependence: Secondary | ICD-10-CM

## 2018-03-03 DIAGNOSIS — E1140 Type 2 diabetes mellitus with diabetic neuropathy, unspecified: Secondary | ICD-10-CM | POA: Diagnosis present

## 2018-03-03 DIAGNOSIS — A419 Sepsis, unspecified organism: Secondary | ICD-10-CM | POA: Diagnosis not present

## 2018-03-03 DIAGNOSIS — C779 Secondary and unspecified malignant neoplasm of lymph node, unspecified: Secondary | ICD-10-CM | POA: Diagnosis present

## 2018-03-03 DIAGNOSIS — J45909 Unspecified asthma, uncomplicated: Secondary | ICD-10-CM | POA: Diagnosis present

## 2018-03-03 DIAGNOSIS — R4701 Aphasia: Secondary | ICD-10-CM | POA: Diagnosis present

## 2018-03-03 DIAGNOSIS — Z515 Encounter for palliative care: Secondary | ICD-10-CM | POA: Diagnosis not present

## 2018-03-03 DIAGNOSIS — G9340 Encephalopathy, unspecified: Secondary | ICD-10-CM

## 2018-03-03 DIAGNOSIS — Z923 Personal history of irradiation: Secondary | ICD-10-CM | POA: Diagnosis not present

## 2018-03-03 DIAGNOSIS — D62 Acute posthemorrhagic anemia: Secondary | ICD-10-CM | POA: Diagnosis present

## 2018-03-03 DIAGNOSIS — I959 Hypotension, unspecified: Secondary | ICD-10-CM | POA: Diagnosis not present

## 2018-03-03 DIAGNOSIS — Z7901 Long term (current) use of anticoagulants: Secondary | ICD-10-CM | POA: Diagnosis not present

## 2018-03-03 DIAGNOSIS — Z9221 Personal history of antineoplastic chemotherapy: Secondary | ICD-10-CM | POA: Diagnosis not present

## 2018-03-03 DIAGNOSIS — Z853 Personal history of malignant neoplasm of breast: Secondary | ICD-10-CM | POA: Diagnosis not present

## 2018-03-03 DIAGNOSIS — G9341 Metabolic encephalopathy: Secondary | ICD-10-CM | POA: Diagnosis present

## 2018-03-03 DIAGNOSIS — Z171 Estrogen receptor negative status [ER-]: Secondary | ICD-10-CM

## 2018-03-03 DIAGNOSIS — Z96643 Presence of artificial hip joint, bilateral: Secondary | ICD-10-CM | POA: Diagnosis present

## 2018-03-03 DIAGNOSIS — Z789 Other specified health status: Secondary | ICD-10-CM

## 2018-03-03 DIAGNOSIS — Z7984 Long term (current) use of oral hypoglycemic drugs: Secondary | ICD-10-CM

## 2018-03-03 DIAGNOSIS — Z7189 Other specified counseling: Secondary | ICD-10-CM | POA: Diagnosis not present

## 2018-03-03 DIAGNOSIS — R404 Transient alteration of awareness: Secondary | ICD-10-CM | POA: Diagnosis not present

## 2018-03-03 DIAGNOSIS — Z9012 Acquired absence of left breast and nipple: Secondary | ICD-10-CM

## 2018-03-03 DIAGNOSIS — R0602 Shortness of breath: Secondary | ICD-10-CM | POA: Diagnosis not present

## 2018-03-03 DIAGNOSIS — Z66 Do not resuscitate: Secondary | ICD-10-CM | POA: Diagnosis present

## 2018-03-03 DIAGNOSIS — J9601 Acute respiratory failure with hypoxia: Secondary | ICD-10-CM | POA: Diagnosis not present

## 2018-03-03 DIAGNOSIS — M2550 Pain in unspecified joint: Secondary | ICD-10-CM | POA: Diagnosis not present

## 2018-03-03 DIAGNOSIS — Z90710 Acquired absence of both cervix and uterus: Secondary | ICD-10-CM | POA: Diagnosis not present

## 2018-03-03 DIAGNOSIS — Z7401 Bed confinement status: Secondary | ICD-10-CM | POA: Diagnosis not present

## 2018-03-03 DIAGNOSIS — R579 Shock, unspecified: Secondary | ICD-10-CM | POA: Diagnosis not present

## 2018-03-03 DIAGNOSIS — Z803 Family history of malignant neoplasm of breast: Secondary | ICD-10-CM

## 2018-03-03 DIAGNOSIS — C50412 Malignant neoplasm of upper-outer quadrant of left female breast: Secondary | ICD-10-CM | POA: Diagnosis not present

## 2018-03-03 DIAGNOSIS — E872 Acidosis, unspecified: Secondary | ICD-10-CM

## 2018-03-03 DIAGNOSIS — C782 Secondary malignant neoplasm of pleura: Secondary | ICD-10-CM | POA: Diagnosis present

## 2018-03-03 DIAGNOSIS — D696 Thrombocytopenia, unspecified: Secondary | ICD-10-CM | POA: Diagnosis present

## 2018-03-03 DIAGNOSIS — Z808 Family history of malignant neoplasm of other organs or systems: Secondary | ICD-10-CM | POA: Diagnosis not present

## 2018-03-03 DIAGNOSIS — Z452 Encounter for adjustment and management of vascular access device: Secondary | ICD-10-CM | POA: Diagnosis not present

## 2018-03-03 DIAGNOSIS — R6521 Severe sepsis with septic shock: Secondary | ICD-10-CM | POA: Diagnosis not present

## 2018-03-03 DIAGNOSIS — C50919 Malignant neoplasm of unspecified site of unspecified female breast: Secondary | ICD-10-CM | POA: Diagnosis not present

## 2018-03-03 DIAGNOSIS — K922 Gastrointestinal hemorrhage, unspecified: Secondary | ICD-10-CM | POA: Diagnosis not present

## 2018-03-03 DIAGNOSIS — D3502 Benign neoplasm of left adrenal gland: Secondary | ICD-10-CM | POA: Diagnosis not present

## 2018-03-03 DIAGNOSIS — Z7982 Long term (current) use of aspirin: Secondary | ICD-10-CM

## 2018-03-03 DIAGNOSIS — Z79899 Other long term (current) drug therapy: Secondary | ICD-10-CM

## 2018-03-03 DIAGNOSIS — C7931 Secondary malignant neoplasm of brain: Secondary | ICD-10-CM | POA: Diagnosis present

## 2018-03-03 DIAGNOSIS — C787 Secondary malignant neoplasm of liver and intrahepatic bile duct: Secondary | ICD-10-CM | POA: Diagnosis present

## 2018-03-03 DIAGNOSIS — Z800 Family history of malignant neoplasm of digestive organs: Secondary | ICD-10-CM | POA: Diagnosis not present

## 2018-03-03 DIAGNOSIS — J9691 Respiratory failure, unspecified with hypoxia: Secondary | ICD-10-CM | POA: Diagnosis present

## 2018-03-03 DIAGNOSIS — G893 Neoplasm related pain (acute) (chronic): Secondary | ICD-10-CM | POA: Diagnosis present

## 2018-03-03 DIAGNOSIS — Z79811 Long term (current) use of aromatase inhibitors: Secondary | ICD-10-CM

## 2018-03-03 DIAGNOSIS — I00 Rheumatic fever without heart involvement: Secondary | ICD-10-CM | POA: Diagnosis not present

## 2018-03-03 DIAGNOSIS — J90 Pleural effusion, not elsewhere classified: Secondary | ICD-10-CM | POA: Diagnosis not present

## 2018-03-03 DIAGNOSIS — F419 Anxiety disorder, unspecified: Secondary | ICD-10-CM | POA: Diagnosis present

## 2018-03-03 DIAGNOSIS — I10 Essential (primary) hypertension: Secondary | ICD-10-CM | POA: Diagnosis present

## 2018-03-03 LAB — POCT I-STAT EG7
Acid-base deficit: 2 mmol/L (ref 0.0–2.0)
Bicarbonate: 21 mmol/L (ref 20.0–28.0)
Calcium, Ion: 1.1 mmol/L — ABNORMAL LOW (ref 1.15–1.40)
HCT: 29 % — ABNORMAL LOW (ref 36.0–46.0)
Hemoglobin: 9.9 g/dL — ABNORMAL LOW (ref 12.0–15.0)
O2 Saturation: 52 %
Potassium: 4.1 mmol/L (ref 3.5–5.1)
Sodium: 132 mmol/L — ABNORMAL LOW (ref 135–145)
TCO2: 22 mmol/L (ref 22–32)
pCO2, Ven: 29 mmHg — ABNORMAL LOW (ref 44.0–60.0)
pH, Ven: 7.468 — ABNORMAL HIGH (ref 7.250–7.430)
pO2, Ven: 25 mmHg — CL (ref 32.0–45.0)

## 2018-03-03 LAB — CBC WITH DIFFERENTIAL/PLATELET
Abs Immature Granulocytes: 0.33 10*3/uL — ABNORMAL HIGH (ref 0.00–0.07)
Basophils Absolute: 0 10*3/uL (ref 0.0–0.1)
Basophils Relative: 0 %
Eosinophils Absolute: 0 10*3/uL (ref 0.0–0.5)
Eosinophils Relative: 0 %
HEMATOCRIT: 28.1 % — AB (ref 36.0–46.0)
Hemoglobin: 8.6 g/dL — ABNORMAL LOW (ref 12.0–15.0)
Immature Granulocytes: 2 %
Lymphocytes Relative: 10 %
Lymphs Abs: 1.5 10*3/uL (ref 0.7–4.0)
MCH: 26.5 pg (ref 26.0–34.0)
MCHC: 30.6 g/dL (ref 30.0–36.0)
MCV: 86.5 fL (ref 80.0–100.0)
Monocytes Absolute: 1.2 10*3/uL — ABNORMAL HIGH (ref 0.1–1.0)
Monocytes Relative: 8 %
NEUTROS ABS: 12 10*3/uL — AB (ref 1.7–7.7)
Neutrophils Relative %: 80 %
Platelets: 108 10*3/uL — ABNORMAL LOW (ref 150–400)
RBC: 3.25 MIL/uL — ABNORMAL LOW (ref 3.87–5.11)
RDW: 17 % — ABNORMAL HIGH (ref 11.5–15.5)
WBC: 15 10*3/uL — ABNORMAL HIGH (ref 4.0–10.5)
nRBC: 0.8 % — ABNORMAL HIGH (ref 0.0–0.2)

## 2018-03-03 LAB — COMPREHENSIVE METABOLIC PANEL
ALT: 55 U/L — ABNORMAL HIGH (ref 0–44)
AST: 75 U/L — ABNORMAL HIGH (ref 15–41)
Albumin: 3.3 g/dL — ABNORMAL LOW (ref 3.5–5.0)
Alkaline Phosphatase: 120 U/L (ref 38–126)
Anion gap: 15 (ref 5–15)
BUN: 37 mg/dL — ABNORMAL HIGH (ref 6–20)
CO2: 20 mmol/L — ABNORMAL LOW (ref 22–32)
Calcium: 9.2 mg/dL (ref 8.9–10.3)
Chloride: 100 mmol/L (ref 98–111)
Creatinine, Ser: 0.96 mg/dL (ref 0.44–1.00)
GFR calc Af Amer: >60 mL/min (ref >60)
GFR calc non Af Amer: >60 mL/min (ref >60)
Glucose, Bld: 138 mg/dL — ABNORMAL HIGH (ref 70–99)
POTASSIUM: 3.7 mmol/L (ref 3.5–5.1)
Sodium: 135 mmol/L (ref 135–145)
Total Bilirubin: 1.5 mg/dL — ABNORMAL HIGH (ref 0.3–1.2)
Total Protein: 7.2 g/dL (ref 6.5–8.1)

## 2018-03-03 LAB — POC OCCULT BLOOD, ED: Fecal Occult Bld: POSITIVE — AB

## 2018-03-03 LAB — I-STAT CREATININE, ED: CREATININE: 0.9 mg/dL (ref 0.44–1.00)

## 2018-03-03 LAB — LACTIC ACID, PLASMA
Lactic Acid, Venous: 6.4 mmol/L (ref 0.5–1.9)
Lactic Acid, Venous: 4.9 mmol/L (ref 0.5–1.9)

## 2018-03-03 LAB — PREPARE RBC (CROSSMATCH)

## 2018-03-03 LAB — PROTIME-INR
INR: 1.08
Prothrombin Time: 14 seconds (ref 11.4–15.2)

## 2018-03-03 LAB — TROPONIN I: Troponin I: 0.12 ng/mL (ref <0.03)

## 2018-03-03 LAB — CBG MONITORING, ED: Glucose-Capillary: 119 mg/dL — ABNORMAL HIGH (ref 70–99)

## 2018-03-03 MED ORDER — HALOPERIDOL LACTATE 5 MG/ML IJ SOLN
INTRAMUSCULAR | Status: AC
  Administered 2018-03-03: 2 mg via INTRAVENOUS
  Filled 2018-03-03: qty 1

## 2018-03-03 MED ORDER — VANCOMYCIN HCL IN DEXTROSE 1-5 GM/200ML-% IV SOLN
1000.0000 mg | Freq: Once | INTRAVENOUS | Status: AC
  Administered 2018-03-03: 1000 mg via INTRAVENOUS
  Filled 2018-03-03: qty 200

## 2018-03-03 MED ORDER — HALOPERIDOL 0.5 MG PO TABS: 0.5000 mg | ORAL_TABLET | ORAL | Status: DC | PRN

## 2018-03-03 MED ORDER — SODIUM CHLORIDE 0.9 % IV SOLN
INTRAVENOUS | Status: DC
  Administered 2018-03-03: 10 mL via INTRAVENOUS

## 2018-03-03 MED ORDER — ONDANSETRON HCL 4 MG/2ML IJ SOLN: 4.0000 mg | Freq: Four times a day (QID) | INTRAMUSCULAR | Status: DC | PRN

## 2018-03-03 MED ORDER — GLYCOPYRROLATE 1 MG PO TABS: 1.0000 mg | ORAL_TABLET | ORAL | Status: DC | PRN

## 2018-03-03 MED ORDER — FENTANYL CITRATE (PF) 100 MCG/2ML IJ SOLN
25.0000 ug | INTRAMUSCULAR | Status: DC | PRN
  Administered 2018-03-03 – 2018-03-04 (×3): 25 ug via INTRAVENOUS
  Filled 2018-03-03 (×3): qty 2

## 2018-03-03 MED ORDER — HALOPERIDOL LACTATE 5 MG/ML IJ SOLN
0.5000 mg | INTRAMUSCULAR | Status: DC | PRN
  Administered 2018-03-04 – 2018-03-05 (×2): 0.5 mg via INTRAVENOUS
  Filled 2018-03-03 (×2): qty 1

## 2018-03-03 MED ORDER — LORAZEPAM 2 MG/ML IJ SOLN
0.5000 mg | Freq: Once | INTRAMUSCULAR | Status: AC
  Administered 2018-03-03: 0.5 mg via INTRAVENOUS
  Filled 2018-03-03: qty 1

## 2018-03-03 MED ORDER — LEVETIRACETAM IN NACL 500 MG/100ML IV SOLN
500.0000 mg | Freq: Two times a day (BID) | INTRAVENOUS | Status: DC
  Administered 2018-03-04 – 2018-03-05 (×3): 500 mg via INTRAVENOUS
  Filled 2018-03-03 (×4): qty 100

## 2018-03-03 MED ORDER — SODIUM CHLORIDE 0.9 % IV SOLN
2.0000 g | Freq: Once | INTRAVENOUS | Status: AC
  Administered 2018-03-03: 2 g via INTRAVENOUS
  Filled 2018-03-03: qty 2

## 2018-03-03 MED ORDER — SODIUM CHLORIDE 0.9 % IV SOLN: 1.0000 g | Freq: Once | INTRAVENOUS | Status: DC

## 2018-03-03 MED ORDER — CALCIUM GLUCONATE-NACL 1-0.675 GM/50ML-% IV SOLN
1.0000 g | Freq: Once | INTRAVENOUS | Status: AC
  Administered 2018-03-03: 1000 mg via INTRAVENOUS
  Filled 2018-03-03: qty 50

## 2018-03-03 MED ORDER — PANTOPRAZOLE SODIUM 40 MG IV SOLR
40.0000 mg | Freq: Two times a day (BID) | INTRAVENOUS | Status: DC
  Administered 2018-03-03 – 2018-03-05 (×4): 40 mg via INTRAVENOUS
  Filled 2018-03-03 (×4): qty 40

## 2018-03-03 MED ORDER — LORAZEPAM 1 MG PO TABS: 1.0000 mg | ORAL_TABLET | ORAL | Status: DC | PRN

## 2018-03-03 MED ORDER — BISACODYL 10 MG RE SUPP: 10.0000 mg | Freq: Every day | RECTAL | Status: DC | PRN

## 2018-03-03 MED ORDER — FENTANYL CITRATE (PF) 100 MCG/2ML IJ SOLN
50.0000 ug | Freq: Once | INTRAMUSCULAR | Status: AC
  Administered 2018-03-03: 50 ug via INTRAVENOUS
  Filled 2018-03-03: qty 2

## 2018-03-03 MED ORDER — LIDOCAINE HCL 1 % IJ SOLN
INTRAMUSCULAR | Status: AC | PRN
  Administered 2018-03-03: 5 mL via INTRADERMAL

## 2018-03-03 MED ORDER — NOREPINEPHRINE-SODIUM CHLORIDE 4-0.9 MG/250ML-% IV SOLN
0.0000 ug/min | INTRAVENOUS | Status: DC
  Filled 2018-03-03: qty 250

## 2018-03-03 MED ORDER — LACTATED RINGERS IV BOLUS (SEPSIS)
1000.0000 mL | Freq: Once | INTRAVENOUS | Status: AC
  Administered 2018-03-03: 1000 mL via INTRAVENOUS

## 2018-03-03 MED ORDER — DEXAMETHASONE SODIUM PHOSPHATE 4 MG/ML IJ SOLN
4.0000 mg | Freq: Four times a day (QID) | INTRAMUSCULAR | Status: DC
  Administered 2018-03-04 – 2018-03-05 (×5): 4 mg via INTRAVENOUS
  Filled 2018-03-03 (×6): qty 1

## 2018-03-03 MED ORDER — LACTATED RINGERS IV BOLUS (SEPSIS)
500.0000 mL | Freq: Once | INTRAVENOUS | Status: AC
  Administered 2018-03-03: 500 mL via INTRAVENOUS

## 2018-03-03 MED ORDER — DEXAMETHASONE SODIUM PHOSPHATE 10 MG/ML IJ SOLN
10.0000 mg | Freq: Once | INTRAMUSCULAR | Status: AC
  Administered 2018-03-03: 10 mg via INTRAVENOUS
  Filled 2018-03-03: qty 1

## 2018-03-03 MED ORDER — IOPAMIDOL (ISOVUE-370) INJECTION 76%
INTRAVENOUS | Status: AC
  Filled 2018-03-03: qty 100

## 2018-03-03 MED ORDER — IOPAMIDOL (ISOVUE-370) INJECTION 76%
100.0000 mL | Freq: Once | INTRAVENOUS | Status: AC | PRN
  Administered 2018-03-03: 100 mL via INTRAVENOUS

## 2018-03-03 MED ORDER — BIOTENE DRY MOUTH MT LIQD: 15.0000 mL | OROMUCOSAL | Status: DC | PRN

## 2018-03-03 MED ORDER — GLYCOPYRROLATE 0.2 MG/ML IJ SOLN: 0.2000 mg | INTRAMUSCULAR | Status: DC | PRN

## 2018-03-03 MED ORDER — METRONIDAZOLE IN NACL 5-0.79 MG/ML-% IV SOLN
500.0000 mg | Freq: Three times a day (TID) | INTRAVENOUS | Status: DC
  Administered 2018-03-03: 500 mg via INTRAVENOUS
  Filled 2018-03-03: qty 100

## 2018-03-03 MED ORDER — METRONIDAZOLE IN NACL 5-0.79 MG/ML-% IV SOLN: 500.0000 mg | Freq: Three times a day (TID) | INTRAVENOUS | Status: DC

## 2018-03-03 MED ORDER — HALOPERIDOL LACTATE 5 MG/ML IJ SOLN
2.0000 mg | Freq: Once | INTRAMUSCULAR | Status: AC
  Administered 2018-03-03: 2 mg via INTRAVENOUS

## 2018-03-03 MED ORDER — SODIUM CHLORIDE (PF) 0.9 % IJ SOLN
INTRAMUSCULAR | Status: AC
  Filled 2018-03-03: qty 50

## 2018-03-03 MED ORDER — LIDOCAINE HCL 1 % IJ SOLN
INTRAMUSCULAR | Status: AC
  Filled 2018-03-03: qty 20

## 2018-03-03 MED ORDER — LORAZEPAM 2 MG/ML PO CONC: 1.0000 mg | ORAL | Status: DC | PRN

## 2018-03-03 MED ORDER — HALOPERIDOL LACTATE 2 MG/ML PO CONC
0.5000 mg | ORAL | Status: DC | PRN
  Filled 2018-03-03: qty 0.3

## 2018-03-03 MED ORDER — ONDANSETRON 4 MG PO TBDP: 4.0000 mg | ORAL_TABLET | Freq: Four times a day (QID) | ORAL | Status: DC | PRN

## 2018-03-03 MED ORDER — LORAZEPAM 2 MG/ML IJ SOLN
1.0000 mg | INTRAMUSCULAR | Status: DC | PRN
  Administered 2018-03-04 – 2018-03-05 (×5): 1 mg via INTRAVENOUS
  Filled 2018-03-03 (×5): qty 1

## 2018-03-03 MED ORDER — SODIUM CHLORIDE 0.9 % IV SOLN: 2.0000 g | Freq: Once | INTRAVENOUS | Status: DC

## 2018-03-03 NOTE — ED Notes (Signed)
Date and time results received: 03/03/18 1807 (use smartphrase ".now" to insert current time)  Test: lactic acid Critical Value: 4.9  Name of Provider Notified: Dr Quintella Reichert  Orders Received? Or Actions Taken?: Actions Taken: notified Dr Quintella Reichert of lactic acide 4.9.

## 2018-03-03 NOTE — ED Notes (Signed)
GI has came to speak with patients spouse, but it was decided to not perform an assessment. Family are at bedside.

## 2018-03-03 NOTE — ED Notes (Signed)
Patient transported to CT for head CT.

## 2018-03-03 NOTE — ED Triage Notes (Signed)
Patient's husband states he was headed to the [facility] today and the patient began c/o SOB, weakness, and pain all over. Patient began chemo last week.

## 2018-03-03 NOTE — Consult Note (Addendum)
Consultation  Referring Provider: Er MD Lonia Chimera Care Physician:  Orpah Melter, MD Primary Gastroenterologist:  Dr.Stark  Reason for Consultation:   Anemia, rectal bleeding , diffuse pain, hypotension  HPI: Brandi Dickson is a 58 y.o. female who we are asked to see the ER physician for finding of bloody stool on rectal exam, and anemia with hemoglobin in the 8 range. Patient had presented to the emergency room earlier this afternoon, brought in by her husband.  They had been on the way to the cancer center for some genetic testing, and patient started complaining of pain "all over", and told her husband felt like she was dying.  She was on able to be specific about where she was hurting.  Patient's husband says that she has not been eating or drinking much of anything since she underwent chemotherapy with [facility] on 02/21/2018.  Unfortunately patient has history of breast cancer which was initially diagnosed in 2018 and she underwent an initial course of chemotherapy and radiation. She was then hospitalized in December 2019 with a CVA and work-up at that time positive for diffusely metastatic breast cancer.  On MRI of the brain she was noted to have multiple metastatic lesions in the cerebrum and cerebellum.  She is just completed a course of radiation.  CT of the abdomen and pelvis on 01/28/2018 did show wall thickening in the sigmoid colon and rectum and there was also suggestion of peritoneal nodularity and metastatic disease in the liver and spleen.  She had thoracentesis on 01/30/2018 due to new pleural effusions and path pertinent for metastatic adenocarcinoma. She has just initiated therapy with the [facility]  on 02/21/2018.  She is anticipated to do 5 cycles. Is felt by [name] her oncologist to be at high risk for thrombotic events and has been on Lovenox 80 mg subcu twice daily at home.  Per Oncology notes will of care is for palliation.  Apparently has not had any specific  complaints of abdominal pain at home over the past week, no nausea or vomiting, no documented fever.  She had been using a bedside commode and was not having any diarrhea melena or blood in stool that her husband has been aware of. Does have diagnosis of left-sided ulcerative colitis which was made in 2017 when she underwent Colonoscopy.  She had evidence of moderate inflammation in the rectum, sigmoid, descending colon, splenic flexure mild changes in the transverse colon and hepatic flexure.  Biopsy showed chronic mildly active colitis.  She has been managed with mesalamine 800 mg, 2 p.o. 3 times daily has never required escalation of therapy.  Patient was hypotensive on arrival to the ER, blood pressure 77/64 pulse 130.  She was difficult to access for hydration and has had central line placed and is being volume repleted.  Hemoglobin 8.6 from her baseline of 11-13.  Hemoglobin was 11.7 as of 02/25/2018. WBC 15,000 platelets 108, lactic acid elevated at 6.4, INR 1.08. T bili 1.5, AST 75, ALT of 55.  Troponin positive at 0.12 Chest x-ray able right effusion question infiltrate.  CT of the head has been ordered and is pending, CT Angio of the chest in progress. CT of the abdomen and pelvis has been ordered this afternoon and is pending.   I have spoken with patient's husband the patient has been in radiology multiple exams.     Past Medical History:  Diagnosis Date  . Arthritis   . Asthma    triggered with [name]  perfumes and cigarette smoke  . Cancer ([place])   . Colitis   . Diabetes mellitus without complication ([place])   . History of radiation therapy 11/22/16-01/10/17   left breast 50.4 Gy in 28 fractions, axillary region 45 Gy in 25 fractions, lumpectomy cavity boost 10 Gy tin 5 fractions  . Hypertension   . Neuropathy     Past Surgical History:  Procedure Laterality Date  . ABDOMINAL HYSTERECTOMY    . BREAST LUMPECTOMY WITH RADIOACTIVE SEED AND SENTINEL LYMPH NODE BIOPSY Left  10/09/2016   Procedure: LEFT BREAST LUMPECTOMY WITH RADIOACTIVE SEED AND L4EFT AXILLARY SENTINEL LYMPH NODE BIOPSY;  Surgeon: [name], MD;  Location: [place];  Service: General;  Laterality: Left;  . DILATION AND CURETTAGE OF UTERUS    . KNEE ARTHROSCOPY Left   . PORTACATH PLACEMENT Right 05/08/2016   Procedure: INSERTION PORT-A-CATH WITH [place];  Surgeon: [name], MD;  Location: [place];  Service: General;  Laterality: Right;  . TONSILLECTOMY    . TOTAL HIP ARTHROPLASTY Right   . TOTAL HIP ARTHROPLASTY Left 03/06/2016   Procedure: LEFT TOTAL HIP ARTHROPLASTY ANTERIOR APPROACH;  Surgeon: [name], MD;  Location: [facility];  Service: Orthopedics;  Laterality: Left;    Prior to Admission medications   Medication Sig Start Date End Date Taking? Authorizing Provider  metoprolol tartrate (LOPRESSOR) 25 MG tablet Take 1 tablet (25 mg total) by mouth 2 (two) times daily. 01/31/18 01/31/19 Yes Georgette Shell, MD  albuterol (PROVENTIL HFA;VENTOLIN HFA) 108 (90 Base) MCG/ACT inhaler Inhale 1-2 puffs into the lungs every 6 (six) hours as needed for wheezing or shortness of breath.    [provider]  anastrozole (ARIMIDEX) 1 MG tablet Take 1 tablet (1 mg total) by mouth daily. 10/08/17   Truitt Merle, MD  aspirin EC 81 MG tablet Take 81 mg by mouth 2 (two) times daily.    [provider]  atorvastatin (LIPITOR) 20 MG tablet Take 1 tablet (20 mg total) by mouth daily. 01/31/18 01/31/19  Georgette Shell, MD  dexamethasone (DECADRON) 4 MG tablet Take 1 tablet (4 mg total) by mouth 2 (two) times daily with a meal. 01/31/18   Tyler Pita, MD  dicyclomine (BENTYL) 20 MG tablet Take 1 tablet (20 mg total) by mouth 4 (four) times daily -  before meals and at bedtime. Patient not taking: Reported on 02/25/2018 06/05/17   Truitt Merle, MD  enoxaparin (LOVENOX) 80 MG/0.8ML injection Inject 0.8 mLs (80 mg total) into the skin every 12 (twelve) hours for 30 days.  02/18/18 03/20/18  Truitt Merle, MD  gabapentin (NEURONTIN) 100 MG capsule Take 200 mg by mouth at bedtime.     [provider]  Garlic (GARLIQUE) 818 MG TBEC Take 400 mg by mouth daily.    [provider]  Ginkgo Biloba Extract 120 MG CAPS Take 120 mg by mouth daily with breakfast.    [provider]  levETIRAcetam (KEPPRA) 500 MG tablet Take 1 tablet (500 mg total) by mouth 2 (two) times daily for 30 days. 02/16/18 03/18/18  Eber Jones, MD  loperamide (IMODIUM) 2 MG capsule Take 4 mg by mouth as needed for diarrhea or loose stools.    [provider]  Mesalamine 800 MG TBEC 2 TABLETS BY MOUTH THREE TIMES A DAY Patient taking differently: Take 2 tablets by mouth 2 (two) times daily.  07/26/17   Ladene Artist, MD  metFORMIN (GLUCOPHAGE-XR) 500 MG 24 hr tablet Take  1,000 mg by mouth at bedtime.    [provider]  metoprolol tartrate (LOPRESSOR) 50 MG tablet Take 50 mg by mouth 2 (two) times daily. 03/01/18   [provider]  oxyCODONE (OXY IR/ROXICODONE) 5 MG immediate release tablet Take 0.5-1 tablets (2.5-5 mg total) by mouth every 6 (six) hours as needed for severe pain. 02/27/18   Truitt Merle, MD  potassium chloride SA (KLOR-CON M20) 20 MEQ tablet Take 1 tablet (20 mEq total) by mouth 2 (two) times daily. Patient taking differently: Take 20 mEq by mouth daily with breakfast.  05/10/17   Truitt Merle, MD  traMADol Veatrice Bourbon) 50 MG tablet  02/24/18   [provider]    Current Facility-Administered Medications  Medication Dose Route Frequency Provider Last Rate Last Dose  . calcium gluconate 1 g/ 50 mL sodium chloride IVPB  1 g Intravenous Once Minda Ditto, RPH      . iopamidol (ISOVUE-370) 76 % injection           . lactated ringers bolus 500 mL  500 mL Intravenous Once Sherwood Gambler, MD      . lidocaine (XYLOCAINE) 1 % (with pres) injection           . metroNIDAZOLE (FLAGYL) IVPB 500 mg  500 mg Intravenous Q8H Sherwood Gambler, MD    Stopped at 03/03/18 1457  . norepinephrine (LEVOPHED) 34m in NS 2537mpremix infusion  0-40 mcg/min Intravenous Continuous GoSherwood GamblerMD      . sodium chloride (PF) 0.9 % injection            Current Outpatient Medications  Medication Sig Dispense Refill  . metoprolol tartrate (LOPRESSOR) 25 MG tablet Take 1 tablet (25 mg total) by mouth 2 (two) times daily. 60 tablet 11  . albuterol (PROVENTIL HFA;VENTOLIN HFA) 108 (90 Base) MCG/ACT inhaler Inhale 1-2 puffs into the lungs every 6 (six) hours as needed for wheezing or shortness of breath.    . anastrozole (ARIMIDEX) 1 MG tablet Take 1 tablet (1 mg total) by mouth daily. 30 tablet 5  . aspirin EC 81 MG tablet Take 81 mg by mouth 2 (two) times daily.    . [name] Kitchentorvastatin (LIPITOR) 20 MG tablet Take 1 tablet (20 mg total) by mouth daily. 30 tablet 1  . dexamethasone (DECADRON) 4 MG tablet Take 1 tablet (4 mg total) by mouth 2 (two) times daily with a meal. 40 tablet 2  . dicyclomine (BENTYL) 20 MG tablet Take 1 tablet (20 mg total) by mouth 4 (four) times daily -  before meals and at bedtime. (Patient not taking: Reported on 02/25/2018) 30 tablet 0  . enoxaparin (LOVENOX) 80 MG/0.8ML injection Inject 0.8 mLs (80 mg total) into the skin every 12 (twelve) hours for 30 days. 48 mL 0  . gabapentin (NEURONTIN) 100 MG capsule Take 200 mg by mouth at bedtime.     . Garlic (GARLIQUE) 40244G TBEC Take 400 mg by mouth daily.    . Ginkgo Biloba Extract 120 MG CAPS Take 120 mg by mouth daily with breakfast.    . levETIRAcetam (KEPPRA) 500 MG tablet Take 1 tablet (500 mg total) by mouth 2 (two) times daily for 30 days. 60 tablet 0  . loperamide (IMODIUM) 2 MG capsule Take 4 mg by mouth as needed for diarrhea or loose stools.    . Mesalamine 800 MG TBEC 2 TABLETS BY MOUTH THREE TIMES A DAY (Patient taking differently: Take 2 tablets by mouth 2 (two) times daily. )  180 tablet 2  . metFORMIN (GLUCOPHAGE-XR) 500 MG 24 hr tablet Take 1,000 mg by mouth at bedtime.     . metoprolol tartrate (LOPRESSOR) 50 MG tablet Take 50 mg by mouth 2 (two) times daily.    [name] Kitchen oxyCODONE (OXY IR/ROXICODONE) 5 MG immediate release tablet Take 0.5-1 tablets (2.5-5 mg total) by mouth every 6 (six) hours as needed for severe pain. 30 tablet 0  . potassium chloride SA (KLOR-CON M20) 20 MEQ tablet Take 1 tablet (20 mEq total) by mouth 2 (two) times daily. (Patient taking differently: Take 20 mEq by mouth daily with breakfast. ) 60 tablet 2  . traMADol (ULTRAM) 50 MG tablet      Facility-Administered Medications Ordered in Other Encounters  Medication Dose Route Frequency Provider Last Rate Last Dose  . heparin lock flush 100 unit/mL  500 Units Intracatheter Once PRN Truitt Merle, MD      . sodium chloride flush (NS) 0.9 % injection 10 mL  10 mL Intracatheter PRN Truitt Merle, MD        Allergies as of 03/03/2018 - Review Complete 03/03/2018  Allergen Reaction Noted  . Gadavist [gadobutrol] Nausea And Vomiting 11/28/2017  . Lisinopril Palpitations 07/11/2015  . Augmentin [amoxicillin-pot clavulanate] Other (See Comments) 09/21/2014  . Benadryl [diphenhydramine] Itching and Anxiety 05/13/2016  . Losartan potassium Palpitations 10/05/2016    Family History  Problem Relation Age of Onset  . Colon cancer Father   . Stomach cancer Paternal Uncle   . Stomach cancer Paternal Uncle   . Melanoma Brother   . Thyroid cancer Brother   . Breast cancer Maternal Aunt   . Breast cancer Maternal Aunt   . Breast cancer Cousin     Social History   Socioeconomic History  . Marital status: Married    Spouse name: Not on file  . Number of children: 0  . Years of education: Not on file  . Highest education level: Not on file  Occupational History    Employer: [employer]  . Financial resource strain: Not on file  . Food insecurity:    Worry: Not on file    Inability: Not on file  . Transportation needs:    Medical: Not on file    Non-medical: Not on file  Tobacco Use    . Smoking status: Former Smoker    Packs/day: 1.00    Years: 29.00    Pack years: 29.00    Types: Cigarettes    Last attempt to quit: 10/27/2002    Years since quitting: 15.3  . Smokeless tobacco: Never Used  Substance and Sexual Activity  . Alcohol use: No    Alcohol/week: 0.0 standard drinks  . Drug use: No  . Sexual activity: Yes    Birth control/protection: Surgical  Lifestyle  . Physical activity:    Days per week: Not on file    Minutes per session: Not on file  . Stress: Not on file  Relationships  . Social connections:    Talks on phone: Not on file    Gets together: Not on file    Attends religious service: Not on file    Active member of club or organization: Not on file    Attends meetings of clubs or organizations: Not on file    Relationship status: Not on file  . Intimate partner violence:    Fear of current or ex partner: Not on file    Emotionally abused: Not on file  Physically abused: Not on file    Forced sexual activity: Not on file  Other Topics Concern  . Not on file  Social History Narrative   Married   Former smoker   No ETOH, drug use    Review of Systems: Pertinent positive and negative review of systems were noted in the above HPI section.  All other review of systems was otherwise negative.  Physical Exam: Vital signs in last 24 hours: Temp:  [96.3 F (35.7 C)] 96.3 F (35.7 C) (02/03 1353) Pulse Rate:  [87-130] 87 (02/03 1415) Resp:  [16-34] 20 (02/03 1610) BP: (77-113)/(50-82) 102/72 (02/03 1606) SpO2:  [100 %] 100 % (02/03 1415) Weight:  [78.5 kg] 78.5 kg (02/03 1328)   General: Not examined myself.  Patient has been out of the room Head:  Normocephalic and atraumatic. Eyes:  Sclera clear, no icterus.   Conjunctiva pink. Ears:  Normal auditory acuity. Nose:  No deformity, discharge,  or lesions. Mouth:  No deformity or lesions.   Neck:  Supple; no masses or thyromegaly. Lungs:  Clear throughout to auscultation.   No  wheezes, crackles, or rhonchi. Heart:  Regular rate and rhythm; no murmurs, clicks, rubs,  or gallops. Abdomen:  Soft,nontender, BS active,nonpalp mass or hsm.   Rectal:  Deferred  Msk:  Symmetrical without gross deformities. . Pulses:  Normal pulses noted. Extremities:  Without clubbing or edema. Neurologic:  Alert and  oriented x4;  grossly normal neurologically. Skin:  Intact without significant lesions or rashes.. Psych:  Alert and cooperative. Normal mood and affect.  Intake/Output from previous day: No intake/output data recorded. Intake/Output this shift: No intake/output data recorded.  Lab Results: Recent Labs    03/03/18 1332 03/03/18 1403  WBC 15.0*  --   HGB 8.6* 9.9*  HCT 28.1* 29.0*  PLT 108*  --    BMET Recent Labs    03/03/18 1332 03/03/18 1400 03/03/18 1403  NA 135  --  132*  K 3.7  --  4.1  CL 100  --   --   CO2 20*  --   --   GLUCOSE 138*  --   --   BUN 37*  --   --   CREATININE 0.96 0.90  --   CALCIUM 9.2  --   --    LFT Recent Labs    03/03/18 1332  PROT 7.2  ALBUMIN 3.3*  AST 75*  ALT 55*  ALKPHOS 120  BILITOT 1.5*   PT/INR Recent Labs    03/03/18 1332  LABPROT 14.0  INR 1.08     IMPRESSION:  #89 58 year old white female, unfortunately with widely metastatic breast cancer diagnosed at time of admission December 2019 when she presented with a CVA. She has multiple brain mets, a malignant effusion, multiple pulmonary nodules a large right hilar soft tissue mass, several hepatic lesions consistent with mets, interval development of a splenic metastatic lesion, suggestion of peritoneal nodularity. Patient just completed radiation of the brain, and initiated new chemotherapy as above on 02/21/2018.  She has not been doing well at home, not eating or drinking much at all according to patient's husband, and been complaining of diffuse body pain early this afternoon.  She is hypotensive ,acidotic,, anemic without evidence of recent GI  bleeding found to have blood on rectal exam today.  Etiology of acute decompensation not clear at present  Patient has been undergoing multiple diagnostic studies this afternoon which are pending  #2 history of mild left-sided ulcerative colitis  diagnosed 2017-she has been maintained on a mesalamine and has never required escalation of therapy.  She did have evidence of left-sided colonic wall thickening on most recent CT about a month ago.  Certainly she may have some bleeding from active colitis.  This may be exacerbated by recent addition of Lovenox. I do not however feel that GI bleeding from colitis is her primary issue today   PLAN: Dr. Loletha Carrow will see this evening. Await pending CT of the abdomen and pelvis CT Angie of the chest PICC line being placed currently Orders placed by ER MD for transfusion x2 Please involve oncology/[name] We will follow and help with her care.  At present, not see indication for endoscopic evaluation.   Amy Esterwood  03/03/2018, 4:52 PM  After reviewing chart and scans and discussing case with our PA, I came to see this unfortunate patient.  She has been made comfort care, and the room is full of her family while Komal remains unresponsive.  I spoke with her husband [name] and decided not to disturb Deshia or her family, as there is sadly no more our service can do for her.  Comfort care seems best plan.   Wilfrid Lund, MD    [facility]

## 2018-03-03 NOTE — H&P (Addendum)
Patient Status: [name] - ED  Assessment and Plan:  Patient in need of venous access for CTA, due to concern for PE - uanble to inject right IJ placed in ED for study. Request made to IR for image guided PICC placement. Consent has been obtained from the husband after discussion of indications, alternatives and risks including but not limited to infection, bleeding, damage to adjacent structures, hematoma and DVT. Will proceed with PICC placement today in IR.   ______________________________________________________________________   History of Present Illness: Brandi Dickson is a 58 y.o. female with past medical history of DM, ulcerative colitis, arthritis, recurrent bilateral multifocal embolic stroke on Lovenox 80 mg BID and metastatic breast cancer followed by [name]. Patient presented to [facility] ED today with sudden onset dyspnea, weakness and pain all over. Patient unable to provide any meaningful history today due to AMS - per husband patient has just started chemotherapy again and she had a similar reaction last time she underwent chemotherapy which necessitated an ICU stay. Per husband patient was in her normal state of health while they were getting prepared to head to the cancer center when she suddenly started to complain of trouble breathing, pain all over her body and weakness - she then began to become more confused after several minutes and she was brought to the ED.  Husband states that she has not had a PICC line before to his knowledge but staff often has trouble placing an IV. She did previously have a port however this was removed after her first round of chemotherapy was complete. He states she has had a left mastectomy. He also reports that she often refuses to eat or drink and he is having a difficult time encouraging her to do so. He is concerned that she has had another stroke. He reports she has been compliant with Lovenox.   Allergies and medications reviewed.   Review  of Systems: A 12 point ROS discussed and pertinent positives are indicated in the HPI above.  All other systems are negative.  Review of Systems  Unable to perform ROS: Mental status change    Vital Signs: BP 102/72   Pulse 87   Temp (!) 96.3 F (35.7 C) (Rectal)   Resp 20   Ht 5\' 5"  (1.651 m)   Wt 173 lb (78.5 kg)   SpO2 100%   BMI 28.79 kg/m   Physical Exam Vitals signs reviewed.  Constitutional:      General: She is not in acute distress.    Appearance: She is ill-appearing. She is not diaphoretic.     Comments: Patient sitting up in bed laughing, mumbling at times, stating she is cold.   HENT:     Head: Normocephalic.  Cardiovascular:     Rate and Rhythm: Tachycardia present.  Pulmonary:     Effort: Pulmonary effort is normal.     Comments: O2 via [place] - patient pulling at [place], attempting to remove. Skin:    General: Skin is warm and dry.  Neurological:     Mental Status: She is alert. She is disoriented.     Comments: She is able to tell me she is in the hospital - when asked other orientation questions she just laughs or babbles incomprehensibly.       Imaging reviewed.   Labs:  COAGS: Recent Labs    11/28/17 2101 01/26/18 0051 01/30/18 1456 02/14/18 1956 02/17/18 1125 03/03/18 1332  INR 0.92 0.98 0.99 0.96 1.06 1.08  APTT 31  32  --  30 34  --     BMP: Recent Labs    02/17/18 1125 02/17/18 1138 02/18/18 1409 02/25/18 1235 03/03/18 1332 03/03/18 1400 03/03/18 1403  NA 141 140 138 135 135  --  132*  K 3.4* 3.3* 3.9 4.2 3.7  --  4.1  CL 105 104 103 98 100  --   --   CO2 25  --  25 22 20*  --   --   GLUCOSE 99 93 105* 153* 138*  --   --   BUN 12 15 13  24* 37*  --   --   CALCIUM 8.8*  --  9.4 10.0 9.2  --   --   CREATININE 0.88 0.80 0.86 1.17* 0.96 0.90  --   GFRNONAA >60  --  >60 51* >60  --   --   GFRAA >60  --  >60 59* >60  --   --        Electronically Signed: Joaquim Nam, PA-C 03/03/2018, 4:16 PM   I spent a total of 15  minutes in face to face in clinical consultation, greater than 50% of which was counseling/coordinating care for venous access.

## 2018-03-03 NOTE — ED Notes (Signed)
Hospitalist assessing patient at this time.

## 2018-03-03 NOTE — ED Notes (Signed)
Date and time results received: 03/03/18 1523 (use smartphrase ".now" to insert current time)  Test: troponin Critical Value: 0.12  Name of Provider Notified: Dr Sherwood Gambler  Orders Received? Or Actions Taken?: Actions Taken: reported troponin 0.12 to Dr Sherwood Gambler.

## 2018-03-03 NOTE — ED Notes (Signed)
Bleeding noted to patient IJ site, despite pressure coagulant dressing, placed by IR. Dr. Ralene Bathe Notified.

## 2018-03-03 NOTE — ED Notes (Signed)
Bear hugger has been placed. Pt family member has been informed.

## 2018-03-03 NOTE — ED Notes (Signed)
ED TO INPATIENT HANDOFF REPORT  Name/Age/Gender Brandi Dickson 58 y.o. female  Code Status    Code Status Orders  (From admission, onward)         Start     Ordered   03/03/18 1828  Do not attempt resuscitation (DNR)  Continuous    Question Answer Comment  Maintain current active treatments Yes   Do not initiate new interventions Yes      03/03/18 1827        Code Status History    Date Active Date Inactive Code Status Order ID Comments User Context   02/14/2018 2332 02/16/2018 1911 Full Code [id number]  Toy Baker, MD Inpatient   01/26/2018 0848 01/31/2018 2106 Full Code [id number]  Shela Leff, MD ED   05/31/2017 2220 06/03/2017 1516 Full Code [id number]  Toy Baker, MD Inpatient   07/19/2016 1644 07/20/2016 1559 Full Code [id number]  Verlee Monte, MD Inpatient   06/07/2016 2150 06/09/2016 1515 Full Code [id number]  Phillips Grout, MD Inpatient   05/19/2016 0043 05/24/2016 1758 Full Code [id number]  Toy Baker, MD Inpatient   03/06/2016 1246 03/07/2016 1858 Full Code [id number]  Norman Herrlich Inpatient      Home/SNF/Other Home  Chief Complaint Abd Pain; SOB  Level of Care/Admitting Diagnosis ED Disposition    ED Disposition Condition [facility] Area: [facility] [[id number]]  Level of Care: Med-Surg [16]  Diagnosis: End of life care [phone]  Admitting Physician: Vilma Prader [[id number]]  Attending Physician: Vilma Prader [[id number]]  Estimated length of stay: past midnight tomorrow  Certification:: I certify this patient will need inpatient services for at least 2 midnights  PT Class (Do Not Modify): Inpatient [101]  PT Acc Code (Do Not Modify): Private [1]       Medical History Past Medical History:  Diagnosis Date  . Arthritis   . Asthma    triggered with [name] perfumes and cigarette smoke  . Cancer ([place])   . Colitis   . Diabetes mellitus without complication ([place])   . History  of radiation therapy 11/22/16-01/10/17   left breast 50.4 Gy in 28 fractions, axillary region 45 Gy in 25 fractions, lumpectomy cavity boost 10 Gy tin 5 fractions  . Hypertension   . Neuropathy     Allergies Allergies  Allergen Reactions  . Gadavist [Gadobutrol] Nausea And Vomiting    Severe vomiting within 1 min of injection.   . Lisinopril Palpitations  . Augmentin [Amoxicillin-Pot Clavulanate] Other (See Comments)    sts gives her a yeast infection Has patient had a PCN reaction causing immediate rash, facial/tongue/throat swelling, SOB or lightheadedness with hypotension: no Has patient had a PCN reaction causing severe rash involving mucus membranes or skin necrosis: no Has patient had a PCN reaction that required hospitalization no Has patient had a PCN reaction occurring within the last 10 years: unknown If all of the above answers are "NO", then may proceed with Cephalosporin use.   . Benadryl [Diphenhydramine] Itching and Anxiety    Per pt: "Makes my skin crawl"; makes pt sensitive to touch  . Losartan Potassium Palpitations    IV Location/Drains/Wounds Patient Lines/Drains/Airways Status   Active Line/Drains/Airways    Name:   Placement date:   Placement time:   Site:   Days:   PICC Double Lumen 32/35/57 PICC Right Basilic 34 cm   32/20/25    1703     less than 1   CVC Triple Lumen  03/03/18 Right Internal jugular   03/03/18    1555     less than 1   Incision (Closed) 01/31/18 Neck Left   01/31/18    1543     31          Labs/Imaging Results for orders placed or performed during the hospital encounter of 03/03/18 (from the past 48 hour(s))  Lactic acid, plasma     Status: Abnormal   Collection Time: 03/03/18  1:32 PM  Result Value Ref Range   Lactic Acid, Venous 6.4 (HH) 0.5 - 1.9 mmol/L    Comment: CRITICAL RESULT CALLED TO, READ BACK BY AND VERIFIED WITH: Ouida Sills [id number] @ [place] Performed at [facility] [address] [place], [place] [address]   Comprehensive metabolic panel     Status: Abnormal   Collection Time: 03/03/18  1:32 PM  Result Value Ref Range   Sodium 135 135 - 145 mmol/L   Potassium 3.7 3.5 - 5.1 mmol/L   Chloride 100 98 - 111 mmol/L   CO2 20 (L) 22 - 32 mmol/L   Glucose, Bld 138 (H) 70 - 99 mg/dL   BUN 37 (H) 6 - 20 mg/dL   Creatinine, Ser 0.96 0.44 - 1.00 mg/dL   Calcium 9.2 8.9 - 10.3 mg/dL   Total Protein 7.2 6.5 - 8.1 g/dL   Albumin 3.3 (L) 3.5 - 5.0 g/dL   AST 75 (H) 15 - 41 U/L   ALT 55 (H) 0 - 44 U/L   Alkaline Phosphatase 120 38 - 126 U/L   Total Bilirubin 1.5 (H) 0.3 - 1.2 mg/dL   GFR calc non Af Amer >60 >60 mL/min   GFR calc Af Amer >60 >60 mL/min   Anion gap 15 5 - 15    Comment: Performed at [facility], [place] [address] [place], [place] [address]  CBC WITH DIFFERENTIAL     Status: Abnormal   Collection Time: 03/03/18  1:32 PM  Result Value Ref Range   WBC 15.0 (H) 4.0 - 10.5 K/uL   RBC 3.25 (L) 3.87 - 5.11 MIL/uL   Hemoglobin 8.6 (L) 12.0 - 15.0 g/dL   HCT 28.1 (L) 36.0 - 46.0 %   MCV 86.5 80.0 - 100.0 fL   MCH 26.5 26.0 - 34.0 pg   MCHC 30.6 30.0 - 36.0 g/dL   RDW 17.0 (H) 11.5 - 15.5 %   Platelets 108 (L) 150 - 400 K/uL    Comment: Immature Platelet Fraction may be clinically indicated, consider ordering this additional test [id number]    nRBC 0.8 (H) 0.0 - 0.2 %   Neutrophils Relative % 80 %   Neutro Abs 12.0 (H) 1.7 - 7.7 K/uL   Lymphocytes Relative 10 %   Lymphs Abs 1.5 0.7 - 4.0 K/uL   Monocytes Relative 8 %   Monocytes Absolute 1.2 (H) 0.1 - 1.0 K/uL   Eosinophils Relative 0 %   Eosinophils Absolute 0.0 0.0 - 0.5 K/uL   Basophils Relative 0 %   Basophils Absolute 0.0 0.0 - 0.1 K/uL   Immature Granulocytes 2 %   Abs Immature Granulocytes 0.33 (H) 0.00 - 0.07 K/uL    Comment: Performed at [facility], [place] [address] [place], [place] [address]  Troponin I - Once     Status: Abnormal   Collection Time:  03/03/18  1:32 PM  Result Value Ref Range   Troponin I 0.12 (HH) <0.03 ng/mL  Comment: CRITICAL RESULT CALLED TO, READ BACK BY AND VERIFIED WITH: Ouida Sills [id number] @ [address] Performed at [facility] [address], [place], [place] [address]   Protime-INR     Status: None   Collection Time: 03/03/18  1:32 PM  Result Value Ref Range   Prothrombin Time 14.0 11.4 - 15.2 seconds   INR 1.08     Comment: Performed at [facility], [place] [address] [place], [place] [address]  CBG monitoring, ED     Status: Abnormal   Collection Time: 03/03/18  1:33 PM  Result Value Ref Range   Glucose-Capillary 119 (H) 70 - 99 mg/dL  I-stat Creatinine, ED     Status: None   Collection Time: 03/03/18  2:00 PM  Result Value Ref Range   Creatinine, Ser 0.90 0.44 - 1.00 mg/dL  POCT I-Stat EG7     Status: Abnormal   Collection Time: 03/03/18  2:03 PM  Result Value Ref Range   pH, Ven 7.468 (H) 7.250 - 7.430   pCO2, Ven 29.0 (L) 44.0 - 60.0 mmHg   pO2, Ven 25.0 (LL) 32.0 - 45.0 mmHg   Bicarbonate 21.0 20.0 - 28.0 mmol/L   TCO2 22 22 - 32 mmol/L   O2 Saturation 52.0 %   Acid-base deficit 2.0 0.0 - 2.0 mmol/L   Sodium 132 (L) 135 - 145 mmol/L   Potassium 4.1 3.5 - 5.1 mmol/L   Calcium, Ion 1.10 (L) 1.15 - 1.40 mmol/L   HCT 29.0 (L) 36.0 - 46.0 %   Hemoglobin 9.9 (L) 12.0 - 15.0 g/dL   Patient temperature HIDE    Sample type VENOUS    Comment NOTIFIED PHYSICIAN   Prepare RBC     Status: None   Collection Time: 03/03/18  4:12 PM  Result Value Ref Range   Order Confirmation      ORDER PROCESSED BY BLOOD BANK Performed at [facility], [place] [address] [place], [place] [address]   POC occult blood, ED Provider will collect     Status: Abnormal   Collection Time: 03/03/18  4:33 PM  Result Value Ref Range   Fecal Occult Bld POSITIVE (A) NEGATIVE  Lactic acid, plasma     Status: Abnormal   Collection Time: 03/03/18  5:30 PM   Result Value Ref Range   Lactic Acid, Venous 4.9 (HH) 0.5 - 1.9 mmol/L    Comment: CRITICAL RESULT CALLED TO, READ BACK BY AND VERIFIED WITH: Ouida Sills [id number] @ [address] Performed at [facility] [address] [place], [place] [address]   Type and screen     Status: None (Preliminary result)   Collection Time: 03/03/18  5:39 PM  Result Value Ref Range   ABO/RH(D) O POS    Antibody Screen NEG    Sample Expiration 03/06/2018    Unit Number [id number]    Blood Component Type RED CELLS,LR    Unit division 00    Status of Unit ISSUED    Unit tag comment EMERGENCY RELEASE    Transfusion Status OK TO TRANSFUSE    Crossmatch Result COMPATIBLE    Unit Number [id number]    Blood Component Type RED CELLS,LR    Unit division 00    Status of Unit ISSUED    Unit tag comment EMERGENCY RELEASE    Transfusion Status OK TO TRANSFUSE    Crossmatch Result COMPATIBLE    Ct Head Wo Contrast  Result  Date: 03/03/2018 CLINICAL DATA:  Altered mental status. History of metastatic breast cancer. EXAM: CT HEAD WITHOUT CONTRAST TECHNIQUE: Contiguous axial images were obtained from the base of the skull through the vertex without intravenous contrast. COMPARISON:  CT head dated February 17, 2018. MRI brain dated February 15, 2018. FINDINGS: Brain: Slightly more pronounced small areas of hypodensity in both cerebellar hemispheres likely correspond to expected evolution of previously seen small acute infarcts on prior MRI. No definite new acute infarct. The small metastases seen on recent MRI are not well visualized by CT. No evidence of acute hemorrhage, hydrocephalus, or extra-axial collection. Vascular: No hyperdense vessel or unexpected calcification. Skull: Normal. Negative for fracture or focal lesion. Sinuses/Orbits: No acute finding. Other: None. IMPRESSION: 1.  No acute intracranial abnormality. 2. Slightly more pronounced small areas of hypodensity in both cerebellar  hemispheres likely correspond to expected evolution of previously seen small acute infarcts on prior MRI. Additional scattered bilateral cerebral infarcts remain not well visualized by CT. No hemorrhage. 3. Known small brain metastases also not well seen by CT. Electronically Signed   By: Titus Dubin M.D.   On: 03/03/2018 16:45   Ct Angio Chest Pe W And/or Wo Contrast  Result Date: 03/03/2018 CLINICAL DATA:  Shortness of breath and diffuse weakness today with diffuse pain and altered mental status. Currently being treated for metastatic breast cancer. EXAM: CT ANGIOGRAPHY CHEST CT ABDOMEN AND PELVIS WITH CONTRAST TECHNIQUE: Multidetector CT imaging of the chest was performed using the standard protocol during bolus administration of intravenous contrast. Multiplanar CT image reconstructions and MIPs were obtained to evaluate the vascular anatomy. Multidetector CT imaging of the abdomen and pelvis was performed using the standard protocol during bolus administration of intravenous contrast. CONTRAST:  135m ISOVUE-370 IOPAMIDOL (ISOVUE-370) INJECTION 76% COMPARISON:  Portable chest obtained earlier today. Chest, abdomen and pelvis CT dated 01/28/2018. FINDINGS: CTA CHEST FINDINGS Cardiovascular: Normally opacified pulmonary arteries with no pulmonary arterial filling defects. The right inferior and superior pulmonary veins are markedly narrowed by and encasing right hilar mass. Normal sized heart. Mediastinum/Nodes: Again demonstrated are multiple enlarged mediastinal nodes. These include a 20 mm short axis subcarinal node on image number 38 series 9, previously 25 mm. The previously demonstrated 21 mm short axis right anterior paratracheal node has a short axis diameter of 15 mm today on image number 27 series 9. The previously demonstrated 15 mm short axis left supraclavicular node has a short axis diameter of 12 mm on image number 14 series 9. Interval mildly enlarged left hilar nodes, the largest with a  short axis diameter of 8 mm on image number 37 series 9. A confluent right hilar nodal mass or primary lung malignancy is again demonstrated causing moderate central bronchial narrowing and marked narrowing of the superior and inferior pulmonary veins on the right. This mass measures approximately 3.9 x 3.8 cm on image number 62 series 11, previously approximately 3.6 x 3.4 cm. Lungs/Pleura: Moderate-sized right pleural effusion, increased. Overall increase in size and number multiple bilateral lung nodules. The largest is in the lingula and has decreased in size, measuring 12 x 10 mm on image number 84 series 28, previously 14 x 13 mm. Mild right lower lobe compressive atelectasis. Musculoskeletal: Thoracic spine degenerative changes. No evidence of bony metastatic disease. Review of the MIP images confirms the above findings. CT ABDOMEN and PELVIS FINDINGS Hepatobiliary: Increased size of multiple liver masses. The largest is a right lobe mass measuring 7.6 x 4.1 cm on image number 4  of series 6, previously 4.8 x 3.6 cm. This is becoming confluent with a smaller mass more medially. There are also some new liver masses. Unremarkable gallbladder. Pancreas: Unremarkable. No pancreatic ductal dilatation or surrounding inflammatory changes. Spleen: Interval increase in size of an inferior splenic mass, currently measuring 10 mm on image number 21 series 6, previously 8 mm. A previously demonstrated larger mass more superiorly is not as well visualized. Adrenals/Urinary Tract: Stable somewhat low density left adrenal mass measuring 1.7 x 1.2 cm on image number 18 series 6. Stable small fat density right adrenal mass. Portions of the urinary bladder and distal ureters are obscured by streak artifacts from bilateral hip prostheses. The visualized portions are unremarkable, as are the kidneys. A small lower pole left renal cyst is unchanged. Stomach/Bowel: Oval high density ingested object in the proximal stomach. No  significant change in diffuse low density wall thickening involving the rectum and distal sigmoid colon. Unremarkable small bowel and appendix. Vascular/Lymphatic: Interval enlarged medial portacaval node with a short axis diameter of 14 mm on image number 18 series 6. No other enlarged lymph nodes are seen. Mild atheromatous plaque formation in the abdominal aorta. Reproductive: Status post hysterectomy. No adnexal masses. Other: Interval multiple anterior subcutaneous nodules at the level of the pelvis, the largest measuring 1.7 cm on image number 54 series 6. There are also multiple sites of subcutaneous air in that region on both sides. Musculoskeletal: Bilateral hip prostheses with associated streak artifacts. Lumbar spine degenerative changes. No evidence of bony metastatic disease. Review of the MIP images confirms the above findings. IMPRESSION: 1. No pulmonary emboli. 2. Interval increase in size of the previously demonstrated right hilar nodal mass or primary lung malignancy with marked narrowing of the right inferior and superior pulmonary veins and moderate narrowing of the central bronchi. 3. Mildly progressive metastatic mediastinal and interval left hilar adenopathy. 4. Interval increase in size and number of multiple liver metastases. 5. Interval increase in size of an inferior splenic metastasis. 6. Interval metastatic portacaval lymph node. 7. Stable left adrenal metastasis or adenoma and stable small right adrenal myelolipoma. 8. No significant change in diffuse low density wall thickening involving the rectum and distal sigmoid colon. This could be infectious or inflammatory nature. This appearance can also be seen following pelvic radiation therapy. 9. Interval multiple sites of bilateral pelvic subcutaneous injections with multiple injection granulomata or subcutaneous metastases. Aortic Atherosclerosis (ICD10-I70.0). Electronically Signed   By: Claudie Revering M.D.   On: 03/03/2018 18:14   Ct  Abdomen Pelvis W Contrast  Result Date: 03/03/2018 CLINICAL DATA:  Shortness of breath and diffuse weakness today with diffuse pain and altered mental status. Currently being treated for metastatic breast cancer. EXAM: CT ANGIOGRAPHY CHEST CT ABDOMEN AND PELVIS WITH CONTRAST TECHNIQUE: Multidetector CT imaging of the chest was performed using the standard protocol during bolus administration of intravenous contrast. Multiplanar CT image reconstructions and MIPs were obtained to evaluate the vascular anatomy. Multidetector CT imaging of the abdomen and pelvis was performed using the standard protocol during bolus administration of intravenous contrast. CONTRAST:  128m ISOVUE-370 IOPAMIDOL (ISOVUE-370) INJECTION 76% COMPARISON:  Portable chest obtained earlier today. Chest, abdomen and pelvis CT dated 01/28/2018. FINDINGS: CTA CHEST FINDINGS Cardiovascular: Normally opacified pulmonary arteries with no pulmonary arterial filling defects. The right inferior and superior pulmonary veins are markedly narrowed by and encasing right hilar mass. Normal sized heart. Mediastinum/Nodes: Again demonstrated are multiple enlarged mediastinal nodes. These include a 20 mm short axis subcarinal node  on image number 38 series 9, previously 25 mm. The previously demonstrated 21 mm short axis right anterior paratracheal node has a short axis diameter of 15 mm today on image number 27 series 9. The previously demonstrated 15 mm short axis left supraclavicular node has a short axis diameter of 12 mm on image number 14 series 9. Interval mildly enlarged left hilar nodes, the largest with a short axis diameter of 8 mm on image number 37 series 9. A confluent right hilar nodal mass or primary lung malignancy is again demonstrated causing moderate central bronchial narrowing and marked narrowing of the superior and inferior pulmonary veins on the right. This mass measures approximately 3.9 x 3.8 cm on image number 62 series 11, previously  approximately 3.6 x 3.4 cm. Lungs/Pleura: Moderate-sized right pleural effusion, increased. Overall increase in size and number multiple bilateral lung nodules. The largest is in the lingula and has decreased in size, measuring 12 x 10 mm on image number 84 series 28, previously 14 x 13 mm. Mild right lower lobe compressive atelectasis. Musculoskeletal: Thoracic spine degenerative changes. No evidence of bony metastatic disease. Review of the MIP images confirms the above findings. CT ABDOMEN and PELVIS FINDINGS Hepatobiliary: Increased size of multiple liver masses. The largest is a right lobe mass measuring 7.6 x 4.1 cm on image number 4 of series 6, previously 4.8 x 3.6 cm. This is becoming confluent with a smaller mass more medially. There are also some new liver masses. Unremarkable gallbladder. Pancreas: Unremarkable. No pancreatic ductal dilatation or surrounding inflammatory changes. Spleen: Interval increase in size of an inferior splenic mass, currently measuring 10 mm on image number 21 series 6, previously 8 mm. A previously demonstrated larger mass more superiorly is not as well visualized. Adrenals/Urinary Tract: Stable somewhat low density left adrenal mass measuring 1.7 x 1.2 cm on image number 18 series 6. Stable small fat density right adrenal mass. Portions of the urinary bladder and distal ureters are obscured by streak artifacts from bilateral hip prostheses. The visualized portions are unremarkable, as are the kidneys. A small lower pole left renal cyst is unchanged. Stomach/Bowel: Oval high density ingested object in the proximal stomach. No significant change in diffuse low density wall thickening involving the rectum and distal sigmoid colon. Unremarkable small bowel and appendix. Vascular/Lymphatic: Interval enlarged medial portacaval node with a short axis diameter of 14 mm on image number 18 series 6. No other enlarged lymph nodes are seen. Mild atheromatous plaque formation in the  abdominal aorta. Reproductive: Status post hysterectomy. No adnexal masses. Other: Interval multiple anterior subcutaneous nodules at the level of the pelvis, the largest measuring 1.7 cm on image number 54 series 6. There are also multiple sites of subcutaneous air in that region on both sides. Musculoskeletal: Bilateral hip prostheses with associated streak artifacts. Lumbar spine degenerative changes. No evidence of bony metastatic disease. Review of the MIP images confirms the above findings. IMPRESSION: 1. No pulmonary emboli. 2. Interval increase in size of the previously demonstrated right hilar nodal mass or primary lung malignancy with marked narrowing of the right inferior and superior pulmonary veins and moderate narrowing of the central bronchi. 3. Mildly progressive metastatic mediastinal and interval left hilar adenopathy. 4. Interval increase in size and number of multiple liver metastases. 5. Interval increase in size of an inferior splenic metastasis. 6. Interval metastatic portacaval lymph node. 7. Stable left adrenal metastasis or adenoma and stable small right adrenal myelolipoma. 8. No significant change in diffuse low density  wall thickening involving the rectum and distal sigmoid colon. This could be infectious or inflammatory nature. This appearance can also be seen following pelvic radiation therapy. 9. Interval multiple sites of bilateral pelvic subcutaneous injections with multiple injection granulomata or subcutaneous metastases. Aortic Atherosclerosis (ICD10-I70.0). Electronically Signed   By: Claudie Revering M.D.   On: 03/03/2018 18:14   Dg Chest Portable 1 View  Result Date: 03/03/2018 CLINICAL DATA:  Encounter for central line placement. EXAM: PORTABLE CHEST 1 VIEW COMPARISON:  Radiograph of March 03, 2018. FINDINGS: The heart size and mediastinal contours are within normal limits. Interval placement of right internal jugular catheter with distal tip in expected position of  cavoatrial junction. No pneumothorax is noted. Left lung is clear. Stable moderate right pleural effusion is noted with associated atelectasis or infiltrate. The visualized skeletal structures are unremarkable. IMPRESSION: Interval placement of right internal jugular catheter with distal tip in expected position of cavoatrial junction. No pneumothorax is noted. Stable moderate right pleural effusion with associated atelectasis or infiltrate Electronically Signed   By: Marijo Conception, M.D.   On: 03/03/2018 16:10   Dg Chest Portable 1 View  Result Date: 03/03/2018 CLINICAL DATA:  Shortness of breath, weakness and diffuse pain. Started chemotherapy last week for metastatic breast cancer. EXAM: PORTABLE CHEST 1 VIEW COMPARISON:  02/14/2018. FINDINGS: Normal sized heart. No significant change in a moderate-sized right pleural effusion. Decreased patchy density at the right lung base. Interval slight increase in a small amount of patchy density at the left lateral lung base. Diffuse osteopenia. IMPRESSION: 1. Stable moderate-sized right pleural effusion. 2. Decreased patchy atelectasis or pneumonia at the right lung base. 3. Interval slight increase in a small amount of patchy atelectasis at the left lateral lung base. Electronically Signed   By: Claudie Revering M.D.   On: 03/03/2018 13:55   Irpicc Placement Left >5 Yrs Inc Img Guide  Result Date: 03/03/2018 INDICATION: 58 year old female with a history of shortness of breath and concern for PE EXAM: PICC LINE PLACEMENT WITH ULTRASOUND AND FLUOROSCOPIC GUIDANCE MEDICATIONS: None. ANESTHESIA/SEDATION: None FLUOROSCOPY TIME:  Fluoroscopy Time: 0 minutes 12 seconds (1.6 mGy). COMPLICATIONS: None PROCEDURE: Informed written consent was obtained from the patient after a thorough discussion of the procedural risks, benefits and alternatives. All questions were addressed. Maximal Sterile Barrier Technique was utilized including caps, mask, sterile gowns, sterile gloves,  sterile drape, hand hygiene and skin antiseptic. A timeout was performed prior to the initiation of the procedure. Patient was position in the supine position on the fluoroscopy table with the right arm abducted 90 degrees. Ultrasound survey of the upper extremity was performed with images stored and sent to PACs. The right basilic vein was selected for access. Once the patient was prepped and draped in the usual sterile fashion, the skin and subcutaneous tissues were generously infiltrated with 1% lidocaine for local anesthesia. A micropuncture access kit was then used to access the targeted vein. Wire was passed centrally, confirmed to be within the venous system under fluoroscopy. A small stab incision was made with an 11 blade scalpel and the sheath was then placed over the wire. Estimated length of the catheter was then performed with the indwelling wire. Catheter was amputated at 34 cm length and placed with coaxial wire through the peel-away. Double, power injectable PICC in the basilic vein. Tip confirmed at the cavoatrial junction, and the catheter is ready for use. Stat lock was placed. Patient tolerated the procedure well and remained hemodynamically stable throughout. No  complications were encountered and no significant blood loss was encountered. IMPRESSION: Status post right basilic vein PICC.  Catheter ready for use. Signed, Dulcy Fanny. Dellia Nims, RPVI [facility] Radiology Electronically Signed   By: Corrie Mckusick D.O.   On: 03/03/2018 17:20   None  Pending Labs Unresulted Labs (From admission, onward)    Start     Ordered   03/03/18 1333  Urine culture  ONCE - STAT,   STAT     03/03/18 1332   03/03/18 1332  Blood Culture (routine x 2)  BLOOD CULTURE X 2,   STAT     03/03/18 1331   03/03/18 1332  Urinalysis, Routine w reflex microscopic  ONCE - STAT,   STAT     03/03/18 1331          Vitals/Pain Today's Vitals   03/03/18 1900 03/03/18  1915 03/03/18 1930 03/03/18 1945  BP: 117/80 115/60 (!) 115/49 (!) 109/42  Pulse:  80    Resp: (!) 22 (!) _0 Temp:      TempSrc:      SpO2:  100%    Weight:      Height:      PainSc:        Isolation Precautions No active isolations  Medications Medications  metroNIDAZOLE (FLAGYL) IVPB 500 mg (0 mg Intravenous Stopped 03/03/18 1834)  iopamidol (ISOVUE-370) 76 % injection (has no administration in time range)  sodium chloride (PF) 0.9 % injection (has no administration in time range)  norepinephrine (LEVOPHED) 68m in NS 2513mpremix infusion (0 mcg/min Intravenous Hold 03/03/18 1851)  lidocaine (XYLOCAINE) 1 % (with pres) injection (has no administration in time range)  sodium chloride (PF) 0.9 % injection (has no administration in time range)  iopamidol (ISOVUE-370) 76 % injection (has no administration in time range)  vancomycin (VANCOCIN) IVPB 1000 mg/200 mL premix (0 mg Intravenous Stopped 03/03/18 1900)  lactated ringers bolus 1,000 mL (0 mLs Intravenous Stopped 03/03/18 1836)    And  lactated ringers bolus 1,000 mL (0 mLs Intravenous Stopped 03/03/18 1930)    And  lactated ringers bolus 500 mL (0 mLs Intravenous Stopped 03/03/18 1800)  ceFEPIme (MAXIPIME) 2 g in sodium chloride 0.9 % 100 mL IVPB (0 g Intravenous Stopped 03/03/18 1420)  iopamidol (ISOVUE-370) 76 % injection 100 mL (100 mLs Intravenous Contrast Given 03/03/18 1434)  LORazepam (ATIVAN) injection 0.5 mg (0.5 mg Intravenous Given 03/03/18 1422)  calcium gluconate 1 g/ 50 mL sodium chloride IVPB (0 g Intravenous Stopped 03/03/18 1929)  lidocaine (XYLOCAINE) 1 % (with pres) injection (5 mLs Intradermal Given 03/03/18 1655)  haloperidol lactate (HALDOL) injection 2 mg (2 mg Intravenous Given 03/03/18 1756)  fentaNYL (SUBLIMAZE) injection 50 mcg (50 mcg Intravenous Given 03/03/18 1908)    Mobility:  Walk with assistance.

## 2018-03-03 NOTE — Progress Notes (Signed)
Spouse has asked for comfort care only, DX: EOL care. Spouse does not want IV meds scheduled:flagyl, keppra, etc. Will document as refusal. Notified MD on call via text/page. Awaiting call back.

## 2018-03-03 NOTE — Progress Notes (Signed)
A consult was received from an ED physician for aztreonam and vancomycin per pharmacy dosing.  The patient's profile has been reviewed for ht/wt/allergies/indication/available labs.   After discussion with EDP, will change aztreonam to cefepime as patient tolerates cephalosporins.  A one time order has been placed for vancomycin and cefepime.  Further antibiotics/pharmacy consults should be ordered by admitting physician if indicated.                       Thank you, Napoleon Form 03/03/2018  1:44 PM

## 2018-03-03 NOTE — Progress Notes (Signed)
ABG delayed due to pt gone to IR.

## 2018-03-03 NOTE — Progress Notes (Signed)
Notified MD on call via page/text that spouse has refused 2nd unit of PRBC's at this time due to comfort care, end of life care. Also, request for discontinue of cont pulse ox order. Awaiting call back.

## 2018-03-03 NOTE — Consult Note (Signed)
NAME:  Brandi Dickson, MRN:  [id number], DOB:  August 02, 1960, LOS: 0 ADMISSION DATE:  03/03/2018, CONSULTATION DATE:  03/03/18 REFERRING MD:  Regenia Skeeter  CHIEF COMPLAINT:  Dyspnea   Brief History   Brandi Dickson is a 58 y.o. female who has metastatic stage 4 breast cancer who had AMS this afternoon on the way to the cancer center and was sent to the ED.  History is provided by the husband who reports that the patient has been miserable and so weak to the point that she can not change the channels on the TV remote and has made her wishes clear not to have any aggressive care.  The patient also has a lower GI bleed on full anti-coagulation for CVA for failing eliquis.  In the ED, she was noted to have lactic acidosis, transient hypotension and hypoxemia.  History of present illness   Brandi Dickson is a 58 y.o. female who has a PMH as outlined below including but not limited to left sided breast CA March 2018 (invasive ductal carcinoma) s/p neoadjuvant TCHP / left breast lumpectomy / adjuvant chemoRT; however, complicated by mets to brain, liver, pleura, and lymph nodes in Dec 2019, CVA (currently on 39m lovenox BID after failing BID eliquis dosing).  [facility] dyspnea, weakness, pain all over, AMS.  Per husband, she just recently started chemo again roughly 1 week ago and when she last had chemo, she had an episode with similar symptoms.  In ED, she was found to have lactate of 6.4, trop 0.12, Hgb 8.6 with positive FOBT.  GI was called in consultation.  She was sent to IR for PICC line insertion so that CTA chest could be obtained to r/o PE (hx difficult IV access; therefore, CVL was placed in ED).  Of note, per oncology notes, current plan of care / chemo is palliative.  Dr. FBurr Medicohad recommended DNR / DNI at last appointment Jan 2020 and pt stated that she would think about this.   Past Medical History  Breast CA s/p chemo / lumpectomy / XRT but complicated by mets to brain,  liver, pleura, lymph nodes. HTN, DM, CVA, asthma.  Significant Hospital Events   2/3 > admit.  Consults:  PCCM.  Procedures:  RUE PICC 2/3 >   Significant Diagnostic Tests:  CXR 2/3 > mod sized right pleural effusion that is stable.  Bibasilar atelectasis. CTH 2/3 >  CTA chest 2/3 >   Micro Data:  Blood 2/3 >  Urine 2/  Antimicrobials:  Metronidazole 2/3 >  Vanc 2/3 >   Interim history/subjective:  Moaning but not interactive  Objective:  Blood pressure 102/72, pulse 87, temperature (!) 96.3 F (35.7 C), temperature source Rectal, resp. rate 20, height 5' 5"  (1.651 m), weight 78.5 kg, SpO2 100 %.       No intake or output data in the 24 hours ending 03/03/18 1643 Filed Weights   03/03/18 1328  Weight: 78.5 kg    Examination: General: Acute on chronically ill appearing female, moderate respiratory distress Neuro: Altered, not following commands but moving all ext to command HEENT: [place]/AT, PERRL, EOM-spontaneous and DMM Cardiovascular: Regular, tachy Lungs: Coarse BS diffusely Abdomen: Soft, NT, ND and +BS Musculoskeletal: -edema and -tenderness Skin: Intact  Assessment & Plan:   58year old female with metastatic breast cancer to the brain, liver and spleen who presents with a GI bleed, AMS and now respiratory failure.  The patient's quality of life  has been very poor and she has made her wishes clear that she does not want any aggressive care.  Given that and the respiratory failure, I see little recourse than proceeding with comfort measures.  I spoke with the husband at length about this and he is in agreement that comfort measures would be the appropriate course of action.  Will make the patient a full DNR and recommend admission to the palliative care floor.  Discussed with EDP.  PCCM will sign off, please call back if needed.   Labs   CBC: Recent Labs  Lab 02/25/18 1235 03/03/18 1332 03/03/18 1403  WBC 11.6* 15.0*  --   NEUTROABS 9.6* 12.0*  --     HGB 11.7* 8.6* 9.9*  HCT 36.8 28.1* 29.0*  MCV 82.3 86.5  --   PLT 189 108*  --    Basic Metabolic Panel: Recent Labs  Lab 02/25/18 1235 03/03/18 1332 03/03/18 1400 03/03/18 1403  NA 135 135  --  132*  K 4.2 3.7  --  4.1  CL 98 100  --   --   CO2 22 20*  --   --   GLUCOSE 153* 138*  --   --   BUN 24* 37*  --   --   CREATININE 1.17* 0.96 0.90  --   CALCIUM 10.0 9.2  --   --    GFR: Estimated Creatinine Clearance: 69.7 mL/min (by C-G formula based on SCr of 0.9 mg/dL). Recent Labs  Lab 02/25/18 1235 03/03/18 1332  WBC 11.6* 15.0*  LATICACIDVEN  --  6.4*   Liver Function Tests: Recent Labs  Lab 02/25/18 1235 03/03/18 1332  AST 73* 75*  ALT 58* 55*  ALKPHOS 158* 120  BILITOT 0.6 1.5*  PROT 7.5 7.2  ALBUMIN 3.3* 3.3*   No results for input(s): LIPASE, AMYLASE in the last 168 hours. No results for input(s): AMMONIA in the last 168 hours. ABG    Component Value Date/Time   HCO3 21.0 03/03/2018 1403   TCO2 22 03/03/2018 1403   ACIDBASEDEF 2.0 03/03/2018 1403   O2SAT 52.0 03/03/2018 1403    Coagulation Profile: Recent Labs  Lab 03/03/18 1332  INR 1.08   Cardiac Enzymes: Recent Labs  Lab 03/03/18 1332  TROPONINI 0.12*   HbA1C: Hgb A1c MFr Bld  Date/Time Value Ref Range Status  02/15/2018 05:55 AM 6.0 (H) 4.8 - 5.6 % Final    Comment:    (NOTE) Pre diabetes:          5.7%-6.4% Diabetes:              >6.4% Glycemic control for   <7.0% adults with diabetes   01/28/2018 04:31 AM 5.3 4.8 - 5.6 % Final    Comment:    (NOTE) Pre diabetes:          5.7%-6.4% Diabetes:              >6.4% Glycemic control for   <7.0% adults with diabetes    CBG: Recent Labs  Lab 03/03/18 1333  GLUCAP 119*    Review of Systems:   Unattainable  Past medical history  She,  has a past medical history of Arthritis, Asthma, Cancer ([place]), Colitis, Diabetes mellitus without complication ([place]), History of radiation therapy (11/22/16-01/10/17), Hypertension, and  Neuropathy.   Surgical History    Past Surgical History:  Procedure Laterality Date  . ABDOMINAL HYSTERECTOMY    . BREAST LUMPECTOMY WITH RADIOACTIVE SEED AND SENTINEL LYMPH NODE BIOPSY Left  10/09/2016   Procedure: LEFT BREAST LUMPECTOMY WITH RADIOACTIVE SEED AND L4EFT AXILLARY SENTINEL LYMPH NODE BIOPSY;  Surgeon: [name], MD;  Location: [facility];  Service: General;  Laterality: Left;  . DILATION AND CURETTAGE OF UTERUS    . KNEE ARTHROSCOPY Left   . PORTACATH PLACEMENT Right 05/08/2016   Procedure: INSERTION PORT-A-CATH WITH [place];  Surgeon: [name], MD;  Location: [facility];  Service: General;  Laterality: Right;  . TONSILLECTOMY    . TOTAL HIP ARTHROPLASTY Right   . TOTAL HIP ARTHROPLASTY Left 03/06/2016   Procedure: LEFT TOTAL HIP ARTHROPLASTY ANTERIOR APPROACH;  Surgeon: [name], MD;  Location: [facility];  Service: Orthopedics;  Laterality: Left;     Social History   reports that she quit smoking about 15 years ago. Her smoking use included cigarettes. She has a 29.00 pack-year smoking history. She has never used smokeless tobacco. She reports that she does not drink alcohol or use drugs.   Family history   Her family history includes Breast cancer in her cousin, maternal aunt, and maternal aunt; Colon cancer in her father; Melanoma in her brother; Stomach cancer in her paternal uncle and paternal uncle; Thyroid cancer in her brother.   Allergies Allergies  Allergen Reactions  . Gadavist [Gadobutrol] Nausea And Vomiting    Severe vomiting within 1 min of injection.   . Lisinopril Palpitations  . Augmentin [Amoxicillin-Pot Clavulanate] Other (See Comments)    sts gives her a yeast infection Has patient had a PCN reaction causing immediate rash, facial/tongue/throat swelling, SOB or lightheadedness with hypotension: no Has patient had a PCN reaction causing severe rash involving mucus membranes or skin necrosis: no Has patient had a PCN  reaction that required hospitalization no Has patient had a PCN reaction occurring within the last 10 years: unknown If all of the above answers are "NO", then may proceed with Cephalosporin use.   . Benadryl [Diphenhydramine] Itching and Anxiety    Per pt: "Makes my skin crawl"; makes pt sensitive to touch  . Losartan Potassium Palpitations     Home meds  Prior to Admission medications   Medication Sig Start Date End Date Taking? Authorizing Provider  metoprolol tartrate (LOPRESSOR) 25 MG tablet Take 1 tablet (25 mg total) by mouth 2 (two) times daily. 01/31/18 01/31/19 Yes Georgette Shell, MD  albuterol (PROVENTIL HFA;VENTOLIN HFA) 108 (90 Base) MCG/ACT inhaler Inhale 1-2 puffs into the lungs every 6 (six) hours as needed for wheezing or shortness of breath.    [provider]  anastrozole (ARIMIDEX) 1 MG tablet Take 1 tablet (1 mg total) by mouth daily. 10/08/17   Truitt Merle, MD  aspirin EC 81 MG tablet Take 81 mg by mouth 2 (two) times daily.    [provider]  atorvastatin (LIPITOR) 20 MG tablet Take 1 tablet (20 mg total) by mouth daily. 01/31/18 01/31/19  Georgette Shell, MD  dexamethasone (DECADRON) 4 MG tablet Take 1 tablet (4 mg total) by mouth 2 (two) times daily with a meal. 01/31/18   Tyler Pita, MD  dicyclomine (BENTYL) 20 MG tablet Take 1 tablet (20 mg total) by mouth 4 (four) times daily -  before meals and at bedtime. Patient not taking: Reported on 02/25/2018 06/05/17   Truitt Merle, MD  enoxaparin (LOVENOX) 80 MG/0.8ML injection Inject 0.8 mLs (80 mg total) into the skin every 12 (twelve) hours for 30 days. 02/18/18 03/20/18  Truitt Merle, MD  gabapentin (NEURONTIN) 100 MG capsule Take  200 mg by mouth at bedtime.     [provider]  Garlic (GARLIQUE) 161 MG TBEC Take 400 mg by mouth daily.    [provider]  Ginkgo Biloba Extract 120 MG CAPS Take 120 mg by mouth daily with breakfast.    [provider]  levETIRAcetam (KEPPRA) 500 MG  tablet Take 1 tablet (500 mg total) by mouth 2 (two) times daily for 30 days. 02/16/18 03/18/18  Eber Jones, MD  loperamide (IMODIUM) 2 MG capsule Take 4 mg by mouth as needed for diarrhea or loose stools.    [provider]  Mesalamine 800 MG TBEC 2 TABLETS BY MOUTH THREE TIMES A DAY Patient taking differently: Take 2 tablets by mouth 2 (two) times daily.  07/26/17   Ladene Artist, MD  metFORMIN (GLUCOPHAGE-XR) 500 MG 24 hr tablet Take 1,000 mg by mouth at bedtime.    [provider]  metoprolol tartrate (LOPRESSOR) 50 MG tablet Take 50 mg by mouth 2 (two) times daily. 03/01/18   [provider]  oxyCODONE (OXY IR/ROXICODONE) 5 MG immediate release tablet Take 0.5-1 tablets (2.5-5 mg total) by mouth every 6 (six) hours as needed for severe pain. 02/27/18   Truitt Merle, MD  potassium chloride SA (KLOR-CON M20) 20 MEQ tablet Take 1 tablet (20 mEq total) by mouth 2 (two) times daily. Patient taking differently: Take 20 mEq by mouth daily with breakfast.  05/10/17   Truitt Merle, MD  traMADol Veatrice Bourbon) 50 MG tablet  02/24/18   [provider]   The patient is critically ill with multiple organ systems failure and requires high complexity decision making for assessment and support, frequent evaluation and titration of therapies, application of advanced monitoring technologies and extensive interpretation of multiple databases.   Critical Care Time devoted to patient care services described in this note is  40  Minutes. This time reflects time of care of this signee Dr Jennet Maduro. This critical care time does not reflect procedure time, or teaching time or supervisory time of PA/NP/Med student/Med Resident etc but could involve care discussion time.  Rush Farmer, M.D. [facility] Pulmonary/Critical Care Medicine. Pager: [phone]. After hours pager: [phone].

## 2018-03-03 NOTE — ED Notes (Signed)
Patient transported to IR directly from CT for Power line placement. Patient husband signed consents with IR PA. Patients husband updated on patient plan of care by Dr. Regenia Skeeter.

## 2018-03-03 NOTE — ED Notes (Signed)
Patient Brandi Dickson's in bilateral arms found to be infiltrated by CT scan. Unable to complete CT with contrast at this time. Dr. Regenia Skeeter notified. Patient to be prepped for central line placement by Dr Regenia Skeeter in ED. IR consulted regarding Power Line placement, due to central line not being specified as "power" line and thus unable to be used for CT contrast.

## 2018-03-03 NOTE — ED Notes (Signed)
Gave report to [place], Therapist, sports for room [phone].

## 2018-03-03 NOTE — ED Provider Notes (Signed)
Patient care assumed at 1600. Patient with metastatic breast cancer here with hypotension, AMS. She is receiving blood transfusions. She was treated with Haldol for agitation and fentanyl for pain. She was evaluated by critical care in the emergency department and decision was made to transition to comfort care. Medicine consulted for admission and further treatment.   Quintella Reichert, MD 03/03/18 [phone]

## 2018-03-03 NOTE — ED Notes (Signed)
Date and time results received: 03/03/18 1509 (use smartphrase ".now" to insert current time)  Test: lactic acid Critical Value: 6.4  Name of Provider Notified: Dr Sherwood Gambler  Orders Received? Or Actions Taken?: Actions Taken: reported to Dr Sherwood Gambler lactic acid 6.4.

## 2018-03-03 NOTE — ED Notes (Signed)
US guided IV obtained; attempt to draw labs; unable. [place] at bedside with [place] to attempt blood draw/IV.

## 2018-03-03 NOTE — Progress Notes (Signed)
2030 Patient received to room 1618 from ED per stretcher, accompanied by spouse and other family/friends. Patient arouseable, eyes closed, and able to follow commands minimally. O2 currently in place. Small active bleed at EJ site to R site of neck that was reported as continued bleeding since insertion. Reported that MD was aware. Pressure dressing in place and reinforced. 1st unit of PRBC's infusing upon arrival to EJ. 2nd unit accompanied in cooler. Spouse voiced that patient was comfort care and did not feel necessary for 2nd unit at this time. HGB 9.9 at 1403 today. Currently PAINAD 0 for objective signs of pain.

## 2018-03-03 NOTE — ED Provider Notes (Signed)
[place] DEPT Provider Note   CSN: [id number] Arrival date & time: 03/03/18  1308  LEVEL 5 CAVEAT - ALTERED MENTAL STATUS/UNSTABLE VITALS   History   Chief Complaint Chief Complaint  Patient presents with  . generalized pain  . Hypotension  . Shortness of Breath    HPI Brandi Dickson is a 58 y.o. female.  HPI  58 year old female currently being treated with chemotherapy for cancer presents with acute dyspnea and abdominal pain.  The patient is confused and so the history is taken from the husband.  She is had a rough course over the last month or 2 with multiple strokes as well as cancer diagnosis and being started on chemotherapy.  She is currently on subcutaneous Lovenox.  The patient had chemotherapy 2 weeks ago and has been eating and drinking less and feeling weaker and weaker.  Was going to oncology appointment today and started to all of a sudden complain of shortness of breath and pain in her abdomen/chest.  Was brought here instead.  Past Medical History:  Diagnosis Date  . Arthritis   . Asthma    triggered with [name] perfumes and cigarette smoke  . Cancer ([place])   . Colitis   . Diabetes mellitus without complication ([place])   . History of radiation therapy 11/22/16-01/10/17   left breast 50.4 Gy in 28 fractions, axillary region 45 Gy in 25 fractions, lumpectomy cavity boost 10 Gy tin 5 fractions  . Hypertension   . Neuropathy     Patient Active Problem List   Diagnosis Date Noted  . Stroke ([place]) 02/15/2018  . TIA (transient ischemic attack) 02/14/2018  . Cancer ([place])   . Metastatic malignant neoplasm ([place])   . Goals of care, counseling/discussion   . Brain metastasis ([place]) 01/26/2018  . Stage IV breast cancer in female [facility]) 01/26/2018  . Ulcerative colitis ([place]) 01/26/2018  . Type 2 diabetes mellitus ([place]) 01/26/2018  . Asthma 01/26/2018  . Neuropathy 01/26/2018  . Cerebral embolism with cerebral infarction  01/26/2018  . Ulcerative colitis with rectal bleeding ([place]) 05/31/2017  . Ulcerative colitis with complication ([place]) 85/63/1497  . Lactic acidosis 07/19/2016  . Antineoplastic chemotherapy induced pancytopenia ([place])   . GIB (gastrointestinal bleeding) 06/07/2016  . Hypertension   . Diabetes mellitus without complication ([place])   . Ulcerative colitis without complications ([place]) 02/63/7858  . Hematochezia   . Generalized abdominal pain   . Nausea vomiting and diarrhea   . Diarrhea of presumed infectious origin   . Hyponatremia 05/18/2016  . Hypokalemia 05/18/2016  . Hypomagnesemia 05/18/2016  . Dehydration 05/18/2016  . Blood in stool 05/18/2016  . Thrombocytopenia ([place]) 05/18/2016  . GI bleeding 05/18/2016  . Port catheter in place 05/11/2016  . Breast cancer of upper-outer quadrant of left female breast ([place]) 05/02/2016  . S/P left THA, AA 03/06/2016  . Rectal bleeding 07/14/2015  . LLQ abdominal pain 07/14/2015  . Special screening for malignant neoplasms, colon 07/14/2015  . Loss of weight 07/14/2015  . Family history of colon cancer 07/14/2015    Past Surgical History:  Procedure Laterality Date  . ABDOMINAL HYSTERECTOMY    . BREAST LUMPECTOMY WITH RADIOACTIVE SEED AND SENTINEL LYMPH NODE BIOPSY Left 10/09/2016   Procedure: LEFT BREAST LUMPECTOMY WITH RADIOACTIVE SEED AND L4EFT AXILLARY SENTINEL LYMPH NODE BIOPSY;  Surgeon: [name], MD;  Location: [place];  Service: General;  Laterality: Left;  . DILATION AND CURETTAGE OF UTERUS    . KNEE ARTHROSCOPY  Left   . PORTACATH PLACEMENT Right 05/08/2016   Procedure: INSERTION PORT-A-CATH WITH [place];  Surgeon: [name], MD;  Location: [place] [facility];  Service: General;  Laterality: Right;  . TONSILLECTOMY    . TOTAL HIP ARTHROPLASTY Right   . TOTAL HIP ARTHROPLASTY Left 03/06/2016   Procedure: LEFT TOTAL HIP ARTHROPLASTY ANTERIOR APPROACH;  Surgeon: [name], MD;  Location: [facility];  Service:  Orthopedics;  Laterality: Left;     OB History   No obstetric history on file.      Home Medications    Prior to Admission medications   Medication Sig Start Date End Date Taking? Authorizing Provider  albuterol (PROVENTIL HFA;VENTOLIN HFA) 108 (90 Base) MCG/ACT inhaler Inhale 1-2 puffs into the lungs every 6 (six) hours as needed for wheezing or shortness of breath.    [provider]  anastrozole (ARIMIDEX) 1 MG tablet Take 1 tablet (1 mg total) by mouth daily. 10/08/17   Truitt Merle, MD  aspirin EC 81 MG tablet Take 81 mg by mouth 2 (two) times daily.    [provider]  atorvastatin (LIPITOR) 20 MG tablet Take 1 tablet (20 mg total) by mouth daily. 01/31/18 01/31/19  Georgette Shell, MD  dexamethasone (DECADRON) 4 MG tablet Take 1 tablet (4 mg total) by mouth 2 (two) times daily with a meal. 01/31/18   Tyler Pita, MD  dicyclomine (BENTYL) 20 MG tablet Take 1 tablet (20 mg total) by mouth 4 (four) times daily -  before meals and at bedtime. Patient not taking: Reported on 02/25/2018 06/05/17   Truitt Merle, MD  enoxaparin (LOVENOX) 80 MG/0.8ML injection Inject 0.8 mLs (80 mg total) into the skin every 12 (twelve) hours for 30 days. 02/18/18 03/20/18  Truitt Merle, MD  gabapentin (NEURONTIN) 100 MG capsule Take 200 mg by mouth at bedtime.     [provider]  Garlic (GARLIQUE) 814 MG TBEC Take 400 mg by mouth daily.    [provider]  Ginkgo Biloba Extract 120 MG CAPS Take 120 mg by mouth daily with breakfast.    [provider]  levETIRAcetam (KEPPRA) 500 MG tablet Take 1 tablet (500 mg total) by mouth 2 (two) times daily for 30 days. 02/16/18 03/18/18  Eber Jones, MD  loperamide (IMODIUM) 2 MG capsule Take 4 mg by mouth as needed for diarrhea or loose stools.    [provider]  Mesalamine 800 MG TBEC 2 TABLETS BY MOUTH THREE TIMES A DAY Patient taking differently: Take 2 tablets by mouth 2 (two) times daily.  07/26/17   Ladene Artist, MD  metFORMIN (GLUCOPHAGE-XR) 500 MG 24 hr tablet Take 1,000 mg by mouth at bedtime.    [provider]  metoprolol tartrate (LOPRESSOR) 25 MG tablet Take 1 tablet (25 mg total) by mouth 2 (two) times daily. 01/31/18 01/31/19  Georgette Shell, MD  oxyCODONE (OXY IR/ROXICODONE) 5 MG immediate release tablet Take 0.5-1 tablets (2.5-5 mg total) by mouth every 6 (six) hours as needed for severe pain. 02/27/18   Truitt Merle, MD  potassium chloride SA (KLOR-CON M20) 20 MEQ tablet Take 1 tablet (20 mEq total) by mouth 2 (two) times daily. Patient taking differently: Take 20 mEq by mouth daily with breakfast.  05/10/17   Truitt Merle, MD  traMADol Veatrice Bourbon) 50 MG tablet  02/24/18   [provider]    Family History Family History  Problem Relation Age of Onset  . Colon cancer Father   .  Stomach cancer Paternal Uncle   . Stomach cancer Paternal Uncle   . Melanoma Brother   . Thyroid cancer Brother   . Breast cancer Maternal Aunt   . Breast cancer Maternal Aunt   . Breast cancer Cousin     Social History Social History   Tobacco Use  . Smoking status: Former Smoker    Packs/day: 1.00    Years: 29.00    Pack years: 29.00    Types: Cigarettes    Last attempt to quit: 10/27/2002    Years since quitting: 15.3  . Smokeless tobacco: Never Used  Substance Use Topics  . Alcohol use: No    Alcohol/week: 0.0 standard drinks  . Drug use: No     Allergies   Gadavist [gadobutrol]; Lisinopril; Augmentin [amoxicillin-pot clavulanate]; Benadryl [diphenhydramine]; and Losartan potassium   Review of Systems Review of Systems  Unable to perform ROS: Mental status change     Physical Exam Updated Vital Signs BP (!) 77/64 (BP Location: Left Arm)   Pulse (!) 130   Resp (!) 22   Ht 5\' 5"  (1.651 m)   Wt 78.5 kg   BMI 28.79 kg/m   Physical Exam Vitals signs and nursing note reviewed.  Constitutional:      Appearance: She is well-developed. She is ill-appearing.    HENT:     Head: Normocephalic and atraumatic.     Right Ear: External ear normal.     Left Ear: External ear normal.     Nose: Nose normal.  Eyes:     General:        Right eye: No discharge.        Left eye: No discharge.  Cardiovascular:     Rate and Rhythm: Normal rate and regular rhythm.     Heart sounds: Normal heart sounds.  Pulmonary:     Effort: Pulmonary effort is normal. No tachypnea or accessory muscle usage.     Breath sounds: Examination of the right-lower field reveals decreased breath sounds. Decreased breath sounds present.  Abdominal:     Palpations: Abdomen is soft.     Tenderness: There is no abdominal tenderness.  Musculoskeletal:     Right lower leg: Edema present.     Left lower leg: Edema present.     Comments: Nonpitting edema to bilateral ankles/feet. Ecchymosis to left back, as well as large ecchymosis to right posterior thigh  Skin:    General: Skin is warm and dry.     Coloration: Skin is pale.  Neurological:     Mental Status: She is alert. She is disoriented.     Comments: Moves all 4 extremities, though often does not follow commands  Psychiatric:        Mood and Affect: Mood is not anxious.      ED Treatments / Results  Labs (all labs ordered are listed, but only abnormal results are displayed) Labs Reviewed  LACTIC ACID, PLASMA - Abnormal; Notable for the following components:      Result Value   Lactic Acid, Venous 6.4 (*)    All other components within normal limits  COMPREHENSIVE METABOLIC PANEL - Abnormal; Notable for the following components:   CO2 20 (*)    Glucose, Bld 138 (*)    BUN 37 (*)    Albumin 3.3 (*)    AST 75 (*)    ALT 55 (*)    Total Bilirubin 1.5 (*)    All other components within normal limits  CBC WITH  DIFFERENTIAL/PLATELET - Abnormal; Notable for the following components:   WBC 15.0 (*)    RBC 3.25 (*)    Hemoglobin 8.6 (*)    HCT 28.1 (*)    RDW 17.0 (*)    Platelets 108 (*)    nRBC 0.8 (*)     Neutro Abs 12.0 (*)    Monocytes Absolute 1.2 (*)    Abs Immature Granulocytes 0.33 (*)    All other components within normal limits  TROPONIN I - Abnormal; Notable for the following components:   Troponin I 0.12 (*)    All other components within normal limits  CBG MONITORING, ED - Abnormal; Notable for the following components:   Glucose-Capillary 119 (*)    All other components within normal limits  POCT I-STAT EG7 - Abnormal; Notable for the following components:   pH, Ven 7.468 (*)    pCO2, Ven 29.0 (*)    pO2, Ven 25.0 (*)    Sodium 132 (*)    Calcium, Ion 1.10 (*)    HCT 29.0 (*)    Hemoglobin 9.9 (*)    All other components within normal limits  POC OCCULT BLOOD, ED - Abnormal; Notable for the following components:   Fecal Occult Bld POSITIVE (*)    All other components within normal limits  CULTURE, BLOOD (ROUTINE X 2)  CULTURE, BLOOD (ROUTINE X 2)  URINE CULTURE  PROTIME-INR  LACTIC ACID, PLASMA  URINALYSIS, ROUTINE W REFLEX MICROSCOPIC  BLOOD GAS, ARTERIAL  I-STAT CREATININE, ED  PREPARE RBC (CROSSMATCH)  TYPE AND SCREEN  Left  EKG None  Radiology Ct Head Wo Contrast  Result Date: 03/03/2018 CLINICAL DATA:  Altered mental status. History of metastatic breast cancer. EXAM: CT HEAD WITHOUT CONTRAST TECHNIQUE: Contiguous axial images were obtained from the base of the skull through the vertex without intravenous contrast. COMPARISON:  CT head dated February 17, 2018. MRI brain dated February 15, 2018. FINDINGS: Brain: Slightly more pronounced small areas of hypodensity in both cerebellar hemispheres likely correspond to expected evolution of previously seen small acute infarcts on prior MRI. No definite new acute infarct. The small metastases seen on recent MRI are not well visualized by CT. No evidence of acute hemorrhage, hydrocephalus, or extra-axial collection. Vascular: No hyperdense vessel or unexpected calcification. Skull: Normal. Negative for fracture or focal  lesion. Sinuses/Orbits: No acute finding. Other: None. IMPRESSION: 1.  No acute intracranial abnormality. 2. Slightly more pronounced small areas of hypodensity in both cerebellar hemispheres likely correspond to expected evolution of previously seen small acute infarcts on prior MRI. Additional scattered bilateral cerebral infarcts remain not well visualized by CT. No hemorrhage. 3. Known small brain metastases also not well seen by CT. Electronically Signed   By: Titus Dubin M.D.   On: 03/03/2018 16:45   Dg Chest Portable 1 View  Result Date: 03/03/2018 CLINICAL DATA:  Encounter for central line placement. EXAM: PORTABLE CHEST 1 VIEW COMPARISON:  Radiograph of March 03, 2018. FINDINGS: The heart size and mediastinal contours are within normal limits. Interval placement of right internal jugular catheter with distal tip in expected position of cavoatrial junction. No pneumothorax is noted. Left lung is clear. Stable moderate right pleural effusion is noted with associated atelectasis or infiltrate. The visualized skeletal structures are unremarkable. IMPRESSION: Interval placement of right internal jugular catheter with distal tip in expected position of cavoatrial junction. No pneumothorax is noted. Stable moderate right pleural effusion with associated atelectasis or infiltrate Electronically Signed   By: Sabino Dick  Brooke Bonito, M.D.   On: 03/03/2018 16:10   Dg Chest Portable 1 View  Result Date: 03/03/2018 CLINICAL DATA:  Shortness of breath, weakness and diffuse pain. Started chemotherapy last week for metastatic breast cancer. EXAM: PORTABLE CHEST 1 VIEW COMPARISON:  02/14/2018. FINDINGS: Normal sized heart. No significant change in a moderate-sized right pleural effusion. Decreased patchy density at the right lung base. Interval slight increase in a small amount of patchy density at the left lateral lung base. Diffuse osteopenia. IMPRESSION: 1. Stable moderate-sized right pleural effusion. 2. Decreased  patchy atelectasis or pneumonia at the right lung base. 3. Interval slight increase in a small amount of patchy atelectasis at the left lateral lung base. Electronically Signed   By: Claudie Revering M.D.   On: 03/03/2018 13:55    Procedures .Critical Care Performed by: Sherwood Gambler, MD Authorized by: Sherwood Gambler, MD   Critical care provider statement:    Critical care time (minutes):  60   Critical care time was exclusive of:  Separately billable procedures and treating other patients   Critical care was necessary to treat or prevent imminent or life-threatening deterioration of the following conditions:  Shock   Critical care was time spent personally by me on the following activities:  Discussions with consultants, evaluation of patient's response to treatment, examination of patient, ordering and performing treatments and interventions, ordering and review of laboratory studies, ordering and review of radiographic studies, pulse oximetry, re-evaluation of patient's condition, obtaining history from patient or surrogate and review of old charts .Central Line Date/Time: 03/03/2018 4:57 PM Performed by: Sherwood Gambler, MD Authorized by: Sherwood Gambler, MD   Consent:    Consent obtained:  Verbal and written   Consent given by:  Spouse   Risks discussed:  Arterial puncture, bleeding, infection, incorrect placement and nerve damage   Alternatives discussed:  No treatment Pre-procedure details:    Hand hygiene: Hand hygiene performed prior to insertion     Sterile barrier technique: All elements of maximal sterile technique followed     Skin preparation:  ChloraPrep   Skin preparation agent: Skin preparation agent completely dried prior to procedure   Anesthesia (see MAR for exact dosages):    Anesthesia method:  Local infiltration   Local anesthetic:  Lidocaine 1% w/o epi Procedure details:    Location:  R internal jugular   Patient position:  Trendelenburg   Procedural supplies:   Triple lumen   Catheter size:  7 Fr   Landmarks identified: yes     Ultrasound guidance: yes     Sterile ultrasound techniques: Sterile gel and sterile probe covers were used     Number of attempts:  1   Successful placement: yes   Post-procedure details:    Post-procedure:  Dressing applied and line sutured   Assessment:  Blood return through all ports, no pneumothorax on x-ray, placement verified by x-ray and free fluid flow   Patient tolerance of procedure:  Tolerated well, no immediate complications   (including critical care time)  Medications Ordered in ED Medications  metroNIDAZOLE (FLAGYL) IVPB 500 mg (0 mg Intravenous Stopped 03/03/18 1457)  vancomycin (VANCOCIN) IVPB 1000 mg/200 mL premix (0 mg Intravenous Hold 03/03/18 1405)  lactated ringers bolus 1,000 mL (0 mLs Intravenous Hold 03/03/18 1420)    And  lactated ringers bolus 1,000 mL (0 mLs Intravenous Hold 03/03/18 1415)    And  lactated ringers bolus 500 mL (has no administration in time range)  iopamidol (ISOVUE-370) 76 % injection (has no  administration in time range)  sodium chloride (PF) 0.9 % injection (has no administration in time range)  calcium gluconate 1 g/ 50 mL sodium chloride IVPB (has no administration in time range)  norepinephrine (LEVOPHED) 4mg  in NS 279mL premix infusion (has no administration in time range)  lidocaine (XYLOCAINE) 1 % (with pres) injection (has no administration in time range)  sodium chloride (PF) 0.9 % injection (has no administration in time range)  iopamidol (ISOVUE-370) 76 % injection (has no administration in time range)  ceFEPIme (MAXIPIME) 2 g in sodium chloride 0.9 % 100 mL IVPB (0 g Intravenous Stopped 03/03/18 1420)  iopamidol (ISOVUE-370) 76 % injection 100 mL (100 mLs Intravenous Contrast Given 03/03/18 1434)  LORazepam (ATIVAN) injection 0.5 mg (0.5 mg Intravenous Given 03/03/18 1422)     Initial Impression / Assessment and Plan / ED Course  I have reviewed the triage vital signs  and the nursing notes.  Pertinent labs & imaging results that were available during my care of the patient were reviewed by me and considered in my medical decision making (see chart for details).     The patient is ill-appearing.  She is protecting her airway.  However she is hypotensive requiring IV fluid boluses.  Given chest x-ray findings and overall poor appearance with shock, she was treated broadly with antibiotics as a code sepsis.  Blood pressure has improved.  Given the acute dyspnea with the malignancy, PE is of high concern.  Unfortunately, both upper extremity IVs placed by nursing have blown and are unable to be used.  I had multiple discussions with radiology because apparently our central line kits cannot be used as power injectors for the CT angiography.  Thus, IR will place a PICC.  However I needed to also place a central line as above due to no access and her overall ill appearance.  I discussed with ICU, Dr. Nelda Marseille, they will consult.  Also talked to GI as after the central line was placed, labs have started to come back and show that she has acute on chronic anemia.  Rectal exam performed after this shows bright red blood on digital rectal exam.  She will be ordered blood transfusion as well.  She is currently critically ill and I believe will need intensive care at this time.  Care transferred to Dr. Ralene Bathe with CTs pending.   Of note, patient would like to be treated per husband, but would be DNR if she were to code.  Final Clinical Impressions(s) / ED Diagnoses   Final diagnoses:  Shock ([place])  Acute GI bleeding  Lactic acidosis    ED Discharge Orders    None       Sherwood Gambler, MD 03/03/18 [phone]

## 2018-03-03 NOTE — ED Notes (Signed)
Chaplain called for patient's husband. Patient discussed DNR and comfort care with provider.

## 2018-03-03 NOTE — Progress Notes (Signed)
Brandi Dickson   DOB:July 14, 1960   XT#:[id number]   GDJ#:[id number]  Oncology follow up   Subjective: Pt came into our cancer center for ointment this afternoon, and crushed in the lobby.  She was brought to emergency room, was found to be hypotensive, lower GI bleeding, and patient became lethargic.  Cording to her husband, she has not been doing well lately, with worsening fatigue, and dyspnea.    Objective:  Vitals:   03/03/18 1930 03/03/18 1945  BP: (!) 115/49 (!) 109/42  Pulse:    Resp: 14 15  Temp:    SpO2:      Body mass index is 28.79 kg/m.  Intake/Output Summary (Last 24 hours) at 03/03/2018 2203 Last data filed at 03/03/2018 2131 Gross per 24 hour  Intake 315 ml  Output -  Net 315 ml     Sclerae unicteric, pale, lethargic   Oropharynx clear  No peripheral adenopathy  Lungs clear -- no rales or rhonchi  Heart regular rate and rhythm  Abdomen benign  MSK no focal spinal tenderness, no peripheral edema  Neuro nonfocal    CBG (last 3)  Recent Labs    03/03/18 1333  GLUCAP 119*     Labs:  Lab Results  Component Value Date   WBC 15.0 (H) 03/03/2018   HGB 9.9 (L) 03/03/2018   HCT 29.0 (L) 03/03/2018   MCV 86.5 03/03/2018   PLT 108 (L) 03/03/2018   NEUTROABS 12.0 (H) 03/03/2018   CMP Latest Ref Rng & Units 03/03/2018 03/03/2018 03/03/2018  Glucose 70 - 99 mg/dL - - 138(H)  BUN 6 - 20 mg/dL - - 37(H)  Creatinine 0.44 - 1.00 mg/dL - 0.90 0.96  Sodium 135 - 145 mmol/L 132(L) - 135  Potassium 3.5 - 5.1 mmol/L 4.1 - 3.7  Chloride 98 - 111 mmol/L - - 100  CO2 22 - 32 mmol/L - - 20(L)  Calcium 8.9 - 10.3 mg/dL - - 9.2  Total Protein 6.5 - 8.1 g/dL - - 7.2  Total Bilirubin 0.3 - 1.2 mg/dL - - 1.5(H)  Alkaline Phos 38 - 126 U/L - - 120  AST 15 - 41 U/L - - 75(H)  ALT 0 - 44 U/L - - 55(H)    Urine Studies No results for input(s): UHGB, CRYS in the last 72 hours.  Invalid input(s): UACOL, UAPR, USPG, UPH, UTP, UGL, UKET, UBIL, UNIT, UROB, ULEU, UEPI, UWBC, URBC,  UBAC, CAST, UCOM, [place]  Basic Metabolic Panel: Recent Labs  Lab 02/25/18 1235 03/03/18 1332 03/03/18 1400 03/03/18 1403  NA 135 135  --  132*  K 4.2 3.7  --  4.1  CL 98 100  --   --   CO2 22 20*  --   --   GLUCOSE 153* 138*  --   --   BUN 24* 37*  --   --   CREATININE 1.17* 0.96 0.90  --   CALCIUM 10.0 9.2  --   --    GFR Estimated Creatinine Clearance: 69.7 mL/min (by C-G formula based on SCr of 0.9 mg/dL). Liver Function Tests: Recent Labs  Lab 02/25/18 1235 03/03/18 1332  AST 73* 75*  ALT 58* 55*  ALKPHOS 158* 120  BILITOT 0.6 1.5*  PROT 7.5 7.2  ALBUMIN 3.3* 3.3*   No results for input(s): LIPASE, AMYLASE in the last 168 hours. No results for input(s): AMMONIA in the last 168 hours. Coagulation profile Recent Labs  Lab 03/03/18 1332  INR 1.08  CBC: Recent Labs  Lab 02/25/18 1235 03/03/18 1332 03/03/18 1403  WBC 11.6* 15.0*  --   NEUTROABS 9.6* 12.0*  --   HGB 11.7* 8.6* 9.9*  HCT 36.8 28.1* 29.0*  MCV 82.3 86.5  --   PLT 189 108*  --    Cardiac Enzymes: Recent Labs  Lab 03/03/18 1332  TROPONINI 0.12*   BNP: Invalid input(s): POCBNP CBG: Recent Labs  Lab 03/03/18 1333  GLUCAP 119*   D-Dimer No results for input(s): DDIMER in the last 72 hours. Hgb A1c No results for input(s): HGBA1C in the last 72 hours. Lipid Profile No results for input(s): CHOL, HDL, LDLCALC, TRIG, CHOLHDL, LDLDIRECT in the last 72 hours. Thyroid function studies No results for input(s): TSH, T4TOTAL, T3FREE, THYROIDAB in the last 72 hours.  Invalid input(s): FREET3 Anemia work up No results for input(s): VITAMINB12, FOLATE, FERRITIN, TIBC, IRON, RETICCTPCT in the last 72 hours. Microbiology No results found for this or any previous visit (from the past 240 hour(s)).    Studies:  Ct Head Wo Contrast  Result Date: 03/03/2018 CLINICAL DATA:  Altered mental status. History of metastatic breast cancer. EXAM: CT HEAD WITHOUT CONTRAST TECHNIQUE: Contiguous  axial images were obtained from the base of the skull through the vertex without intravenous contrast. COMPARISON:  CT head dated February 17, 2018. MRI brain dated February 15, 2018. FINDINGS: Brain: Slightly more pronounced small areas of hypodensity in both cerebellar hemispheres likely correspond to expected evolution of previously seen small acute infarcts on prior MRI. No definite new acute infarct. The small metastases seen on recent MRI are not well visualized by CT. No evidence of acute hemorrhage, hydrocephalus, or extra-axial collection. Vascular: No hyperdense vessel or unexpected calcification. Skull: Normal. Negative for fracture or focal lesion. Sinuses/Orbits: No acute finding. Other: None. IMPRESSION: 1.  No acute intracranial abnormality. 2. Slightly more pronounced small areas of hypodensity in both cerebellar hemispheres likely correspond to expected evolution of previously seen small acute infarcts on prior MRI. Additional scattered bilateral cerebral infarcts remain not well visualized by CT. No hemorrhage. 3. Known small brain metastases also not well seen by CT. Electronically Signed   By: Titus Dubin M.D.   On: 03/03/2018 16:45   Ct Angio Chest Pe W And/or Wo Contrast  Result Date: 03/03/2018 CLINICAL DATA:  Shortness of breath and diffuse weakness today with diffuse pain and altered mental status. Currently being treated for metastatic breast cancer. EXAM: CT ANGIOGRAPHY CHEST CT ABDOMEN AND PELVIS WITH CONTRAST TECHNIQUE: Multidetector CT imaging of the chest was performed using the standard protocol during bolus administration of intravenous contrast. Multiplanar CT image reconstructions and MIPs were obtained to evaluate the vascular anatomy. Multidetector CT imaging of the abdomen and pelvis was performed using the standard protocol during bolus administration of intravenous contrast. CONTRAST:  139m ISOVUE-370 IOPAMIDOL (ISOVUE-370) INJECTION 76% COMPARISON:  Portable chest  obtained earlier today. Chest, abdomen and pelvis CT dated 01/28/2018. FINDINGS: CTA CHEST FINDINGS Cardiovascular: Normally opacified pulmonary arteries with no pulmonary arterial filling defects. The right inferior and superior pulmonary veins are markedly narrowed by and encasing right hilar mass. Normal sized heart. Mediastinum/Nodes: Again demonstrated are multiple enlarged mediastinal nodes. These include a 20 mm short axis subcarinal node on image number 38 series 9, previously 25 mm. The previously demonstrated 21 mm short axis right anterior paratracheal node has a short axis diameter of 15 mm today on image number 27 series 9. The previously demonstrated 15 mm short axis left supraclavicular node  has a short axis diameter of 12 mm on image number 14 series 9. Interval mildly enlarged left hilar nodes, the largest with a short axis diameter of 8 mm on image number 37 series 9. A confluent right hilar nodal mass or primary lung malignancy is again demonstrated causing moderate central bronchial narrowing and marked narrowing of the superior and inferior pulmonary veins on the right. This mass measures approximately 3.9 x 3.8 cm on image number 62 series 11, previously approximately 3.6 x 3.4 cm. Lungs/Pleura: Moderate-sized right pleural effusion, increased. Overall increase in size and number multiple bilateral lung nodules. The largest is in the lingula and has decreased in size, measuring 12 x 10 mm on image number 84 series 28, previously 14 x 13 mm. Mild right lower lobe compressive atelectasis. Musculoskeletal: Thoracic spine degenerative changes. No evidence of bony metastatic disease. Review of the MIP images confirms the above findings. CT ABDOMEN and PELVIS FINDINGS Hepatobiliary: Increased size of multiple liver masses. The largest is a right lobe mass measuring 7.6 x 4.1 cm on image number 4 of series 6, previously 4.8 x 3.6 cm. This is becoming confluent with a smaller mass more medially. There  are also some new liver masses. Unremarkable gallbladder. Pancreas: Unremarkable. No pancreatic ductal dilatation or surrounding inflammatory changes. Spleen: Interval increase in size of an inferior splenic mass, currently measuring 10 mm on image number 21 series 6, previously 8 mm. A previously demonstrated larger mass more superiorly is not as well visualized. Adrenals/Urinary Tract: Stable somewhat low density left adrenal mass measuring 1.7 x 1.2 cm on image number 18 series 6. Stable small fat density right adrenal mass. Portions of the urinary bladder and distal ureters are obscured by streak artifacts from bilateral hip prostheses. The visualized portions are unremarkable, as are the kidneys. A small lower pole left renal cyst is unchanged. Stomach/Bowel: Oval high density ingested object in the proximal stomach. No significant change in diffuse low density wall thickening involving the rectum and distal sigmoid colon. Unremarkable small bowel and appendix. Vascular/Lymphatic: Interval enlarged medial portacaval node with a short axis diameter of 14 mm on image number 18 series 6. No other enlarged lymph nodes are seen. Mild atheromatous plaque formation in the abdominal aorta. Reproductive: Status post hysterectomy. No adnexal masses. Other: Interval multiple anterior subcutaneous nodules at the level of the pelvis, the largest measuring 1.7 cm on image number 54 series 6. There are also multiple sites of subcutaneous air in that region on both sides. Musculoskeletal: Bilateral hip prostheses with associated streak artifacts. Lumbar spine degenerative changes. No evidence of bony metastatic disease. Review of the MIP images confirms the above findings. IMPRESSION: 1. No pulmonary emboli. 2. Interval increase in size of the previously demonstrated right hilar nodal mass or primary lung malignancy with marked narrowing of the right inferior and superior pulmonary veins and moderate narrowing of the central  bronchi. 3. Mildly progressive metastatic mediastinal and interval left hilar adenopathy. 4. Interval increase in size and number of multiple liver metastases. 5. Interval increase in size of an inferior splenic metastasis. 6. Interval metastatic portacaval lymph node. 7. Stable left adrenal metastasis or adenoma and stable small right adrenal myelolipoma. 8. No significant change in diffuse low density wall thickening involving the rectum and distal sigmoid colon. This could be infectious or inflammatory nature. This appearance can also be seen following pelvic radiation therapy. 9. Interval multiple sites of bilateral pelvic subcutaneous injections with multiple injection granulomata or subcutaneous metastases. Aortic Atherosclerosis (ICD10-I70.0).  Electronically Signed   By: Claudie Revering M.D.   On: 03/03/2018 18:14   Ct Abdomen Pelvis W Contrast  Result Date: 03/03/2018 CLINICAL DATA:  Shortness of breath and diffuse weakness today with diffuse pain and altered mental status. Currently being treated for metastatic breast cancer. EXAM: CT ANGIOGRAPHY CHEST CT ABDOMEN AND PELVIS WITH CONTRAST TECHNIQUE: Multidetector CT imaging of the chest was performed using the standard protocol during bolus administration of intravenous contrast. Multiplanar CT image reconstructions and MIPs were obtained to evaluate the vascular anatomy. Multidetector CT imaging of the abdomen and pelvis was performed using the standard protocol during bolus administration of intravenous contrast. CONTRAST:  180m ISOVUE-370 IOPAMIDOL (ISOVUE-370) INJECTION 76% COMPARISON:  Portable chest obtained earlier today. Chest, abdomen and pelvis CT dated 01/28/2018. FINDINGS: CTA CHEST FINDINGS Cardiovascular: Normally opacified pulmonary arteries with no pulmonary arterial filling defects. The right inferior and superior pulmonary veins are markedly narrowed by and encasing right hilar mass. Normal sized heart. Mediastinum/Nodes: Again  demonstrated are multiple enlarged mediastinal nodes. These include a 20 mm short axis subcarinal node on image number 38 series 9, previously 25 mm. The previously demonstrated 21 mm short axis right anterior paratracheal node has a short axis diameter of 15 mm today on image number 27 series 9. The previously demonstrated 15 mm short axis left supraclavicular node has a short axis diameter of 12 mm on image number 14 series 9. Interval mildly enlarged left hilar nodes, the largest with a short axis diameter of 8 mm on image number 37 series 9. A confluent right hilar nodal mass or primary lung malignancy is again demonstrated causing moderate central bronchial narrowing and marked narrowing of the superior and inferior pulmonary veins on the right. This mass measures approximately 3.9 x 3.8 cm on image number 62 series 11, previously approximately 3.6 x 3.4 cm. Lungs/Pleura: Moderate-sized right pleural effusion, increased. Overall increase in size and number multiple bilateral lung nodules. The largest is in the lingula and has decreased in size, measuring 12 x 10 mm on image number 84 series 28, previously 14 x 13 mm. Mild right lower lobe compressive atelectasis. Musculoskeletal: Thoracic spine degenerative changes. No evidence of bony metastatic disease. Review of the MIP images confirms the above findings. CT ABDOMEN and PELVIS FINDINGS Hepatobiliary: Increased size of multiple liver masses. The largest is a right lobe mass measuring 7.6 x 4.1 cm on image number 4 of series 6, previously 4.8 x 3.6 cm. This is becoming confluent with a smaller mass more medially. There are also some new liver masses. Unremarkable gallbladder. Pancreas: Unremarkable. No pancreatic ductal dilatation or surrounding inflammatory changes. Spleen: Interval increase in size of an inferior splenic mass, currently measuring 10 mm on image number 21 series 6, previously 8 mm. A previously demonstrated larger mass more superiorly is not  as well visualized. Adrenals/Urinary Tract: Stable somewhat low density left adrenal mass measuring 1.7 x 1.2 cm on image number 18 series 6. Stable small fat density right adrenal mass. Portions of the urinary bladder and distal ureters are obscured by streak artifacts from bilateral hip prostheses. The visualized portions are unremarkable, as are the kidneys. A small lower pole left renal cyst is unchanged. Stomach/Bowel: Oval high density ingested object in the proximal stomach. No significant change in diffuse low density wall thickening involving the rectum and distal sigmoid colon. Unremarkable small bowel and appendix. Vascular/Lymphatic: Interval enlarged medial portacaval node with a short axis diameter of 14 mm on image number 18 series 6.  No other enlarged lymph nodes are seen. Mild atheromatous plaque formation in the abdominal aorta. Reproductive: Status post hysterectomy. No adnexal masses. Other: Interval multiple anterior subcutaneous nodules at the level of the pelvis, the largest measuring 1.7 cm on image number 54 series 6. There are also multiple sites of subcutaneous air in that region on both sides. Musculoskeletal: Bilateral hip prostheses with associated streak artifacts. Lumbar spine degenerative changes. No evidence of bony metastatic disease. Review of the MIP images confirms the above findings. IMPRESSION: 1. No pulmonary emboli. 2. Interval increase in size of the previously demonstrated right hilar nodal mass or primary lung malignancy with marked narrowing of the right inferior and superior pulmonary veins and moderate narrowing of the central bronchi. 3. Mildly progressive metastatic mediastinal and interval left hilar adenopathy. 4. Interval increase in size and number of multiple liver metastases. 5. Interval increase in size of an inferior splenic metastasis. 6. Interval metastatic portacaval lymph node. 7. Stable left adrenal metastasis or adenoma and stable small right adrenal  myelolipoma. 8. No significant change in diffuse low density wall thickening involving the rectum and distal sigmoid colon. This could be infectious or inflammatory nature. This appearance can also be seen following pelvic radiation therapy. 9. Interval multiple sites of bilateral pelvic subcutaneous injections with multiple injection granulomata or subcutaneous metastases. Aortic Atherosclerosis (ICD10-I70.0). Electronically Signed   By: Claudie Revering M.D.   On: 03/03/2018 18:14   Dg Chest Portable 1 View  Result Date: 03/03/2018 CLINICAL DATA:  Encounter for central line placement. EXAM: PORTABLE CHEST 1 VIEW COMPARISON:  Radiograph of March 03, 2018. FINDINGS: The heart size and mediastinal contours are within normal limits. Interval placement of right internal jugular catheter with distal tip in expected position of cavoatrial junction. No pneumothorax is noted. Left lung is clear. Stable moderate right pleural effusion is noted with associated atelectasis or infiltrate. The visualized skeletal structures are unremarkable. IMPRESSION: Interval placement of right internal jugular catheter with distal tip in expected position of cavoatrial junction. No pneumothorax is noted. Stable moderate right pleural effusion with associated atelectasis or infiltrate Electronically Signed   By: Marijo Conception, M.D.   On: 03/03/2018 16:10   Dg Chest Portable 1 View  Result Date: 03/03/2018 CLINICAL DATA:  Shortness of breath, weakness and diffuse pain. Started chemotherapy last week for metastatic breast cancer. EXAM: PORTABLE CHEST 1 VIEW COMPARISON:  02/14/2018. FINDINGS: Normal sized heart. No significant change in a moderate-sized right pleural effusion. Decreased patchy density at the right lung base. Interval slight increase in a small amount of patchy density at the left lateral lung base. Diffuse osteopenia. IMPRESSION: 1. Stable moderate-sized right pleural effusion. 2. Decreased patchy atelectasis or pneumonia  at the right lung base. 3. Interval slight increase in a small amount of patchy atelectasis at the left lateral lung base. Electronically Signed   By: Claudie Revering M.D.   On: 03/03/2018 13:55   Irpicc Placement Left >5 Yrs Inc Img Guide  Result Date: 03/03/2018 INDICATION: 58 year old female with a history of shortness of breath and concern for PE EXAM: PICC LINE PLACEMENT WITH ULTRASOUND AND FLUOROSCOPIC GUIDANCE MEDICATIONS: None. ANESTHESIA/SEDATION: None FLUOROSCOPY TIME:  Fluoroscopy Time: 0 minutes 12 seconds (1.6 mGy). COMPLICATIONS: None PROCEDURE: Informed written consent was obtained from the patient after a thorough discussion of the procedural risks, benefits and alternatives. All questions were addressed. Maximal Sterile Barrier Technique was utilized including caps, mask, sterile gowns, sterile gloves, sterile drape, hand hygiene and skin antiseptic. A timeout  was performed prior to the initiation of the procedure. Patient was position in the supine position on the fluoroscopy table with the right arm abducted 90 degrees. Ultrasound survey of the upper extremity was performed with images stored and sent to PACs. The right basilic vein was selected for access. Once the patient was prepped and draped in the usual sterile fashion, the skin and subcutaneous tissues were generously infiltrated with 1% lidocaine for local anesthesia. A micropuncture access kit was then used to access the targeted vein. Wire was passed centrally, confirmed to be within the venous system under fluoroscopy. A small stab incision was made with an 11 blade scalpel and the sheath was then placed over the wire. Estimated length of the catheter was then performed with the indwelling wire. Catheter was amputated at 34 cm length and placed with coaxial wire through the peel-away. Double, power injectable PICC in the basilic vein. Tip confirmed at the cavoatrial junction, and the catheter is ready for use. Stat lock was placed.  Patient tolerated the procedure well and remained hemodynamically stable throughout. No complications were encountered and no significant blood loss was encountered. IMPRESSION: Status post right basilic vein PICC.  Catheter ready for use. Signed, Dulcy Fanny. Dellia Nims, RPVI [facility] Rad[facility] Electronically Signed   By: Corrie Mckusick D.O.   On: 03/03/2018 17:20    Assessment: 58 y.o. with metastatic triple negative breast cancer, brain metastasis, status post whole brain radiation, started palliative chemotherapy he decided on 02/21/2018, and presented with hypotension and GI bleeding. She is currently on full dose Lovenox for history of recurrent stroke.  1. Hypotension and shock 2. GI bleeding  3.  Acute encephalopathy 4. Diffusely metastatic breast cancer, triple negative 5. Brain metastasis, s/p radiation  6. Recurrent embolic stroke, on Lovenox 7.  Lactic acidosis   Plan:  Pt is critically ill, her husband has agreed with comfort care only. I support his decision. Pt unfortunately has deteriorated significantly since her diagnosis of metastatic breast cancer 5-6 weeks ago, due to her extremely poor performance status, medical comorbidities, she would not be a candidate for systemic chemotherapy, even if she survives this episode. Her prognosis is dismal, and she will likely pass away soon. Her husband is in agreement with full comfort care. I will f/u tomorrow.    Truitt Merle, MD 03/03/2018

## 2018-03-03 NOTE — ED Notes (Signed)
Dr. Regenia Skeeter placed right CVC triple lumen using sterile technique. Patient tolerated well. Xray ordered for verification. Patient husband updated on patient status. Consents signed for patient to go to IR for Power Line placement in order to allow for CT PE study to be completed.

## 2018-03-03 NOTE — Procedures (Signed)
Interventional Radiology Procedure Note  Procedure: Placement of a right arm PICC.  34cm.  OK to use.  .  Complications: None  Recommendations:  - Ok to use - Do not submerge   - Routine line care   Signed,  Dulcy Fanny. Earleen Newport, DO

## 2018-03-03 NOTE — Progress Notes (Signed)
2131 Unit of PRBC's completed without signs/symptoms of adverse reaction.

## 2018-03-04 DIAGNOSIS — Z515 Encounter for palliative care: Secondary | ICD-10-CM

## 2018-03-04 LAB — TYPE AND SCREEN
ABO/RH(D): O POS
Antibody Screen: NEGATIVE
Unit division: 0
Unit division: 0

## 2018-03-04 LAB — BPAM RBC
BLOOD PRODUCT EXPIRATION DATE: 202003012359
Blood Product Expiration Date: 202002292359
ISSUE DATE / TIME: 202002031702
ISSUE DATE / TIME: 202002031702
Unit Type and Rh: 9500
Unit Type and Rh: 9500

## 2018-03-04 MED ORDER — FENTANYL CITRATE (PF) 100 MCG/2ML IJ SOLN
50.0000 ug | INTRAMUSCULAR | Status: DC | PRN
  Administered 2018-03-04 (×8): 50 ug via INTRAVENOUS
  Filled 2018-03-04 (×8): qty 2

## 2018-03-04 MED ORDER — MORPHINE 100MG IN NS 100ML (1MG/ML) PREMIX INFUSION
2.0000 mg/h | INTRAVENOUS | Status: DC
  Administered 2018-03-04: 1 mg/h via INTRAVENOUS

## 2018-03-04 NOTE — Progress Notes (Signed)
[facility] room available for Mrs. [employer] tomorrow. Paper work completed with spouse and brother today. Dr. Orpah Melter to assume care per family request.   Please send discharge summary to [phone].  RN please call report to [phone].  Thank you, Erling Conte, LCSW [phone]

## 2018-03-04 NOTE — Progress Notes (Signed)
Patient was briefly seen and examined this morning. Unfortunate middle-aged Caucasian female with metastatic breast cancer. Patient was brought into the ED for increasing confusion and pain.  She required frequent doses of fentanyl IV in the ED for pain control and control of agitation. Patient was made comfort care on admission. At the time of my evaluation this morning, patient was accompanied by her brother and pastor. Patient had just received a dose of fentanyl and she was in the rest.  I did not wake her up for a conversation on examination. Her husband/power of attorney was not at bedside. Family members at bedside were at pretty good understanding about the severity of her condition.  They chose hospice facility as discharge disposition.    Patient seems to significant tolerance to opioid and has frequently required IV fentanyl. For now, I will start the patient on morphine drip at a low rate of 1 mg/h.  I have advised the nurse to titrate up morphine to the required rate to keep the patient comfortable. We can discharge the patient to hospice facility whenever it is available.

## 2018-03-04 NOTE — Progress Notes (Signed)
Patient has had episode of increased pain, agitation, restlessness, and anxiety beginning at 2350. Patient has been yelling out in pain, repeating "please help me", trying to take off gown, and get OOB. Spouse at bedside attempting to provide emotional support to patient. Fentanyl prn per order given at 2354, with no effectiveness. Haldol prn per order given at [place], with no effectiveness. Patient episode continued, administered fentanyl at 0039 with no effectiveness. Spouse concerned at bedside of patient's pain, restlessness, and anxiety. Ativan per order administered at 0107 for symptom management. Will monitor for effectiveness of symptoms.

## 2018-03-04 NOTE — Progress Notes (Signed)
Discussed disposition wishes with pt's husband via phone (he is planning to be back at hospital this afternoon). He explains they have decided comfort care is goal. Was initially thinking that pt would pass in hospital but now states it has been explained pt's prognosis more likely several days/couple of weeks. Is requesting referral to hospice home. Discussed options and prefer [facility] if available. CSW made referral, will follow.  Sharren Bridge, MSW, LCSW Clinical Social Work 03/04/2018 [phone]

## 2018-03-04 NOTE — H&P (Signed)
History and Physical   Brandi Dickson PJS:[id number] DOB: 09-29-1960 DOA: 03/03/2018  PCP: Orpah Melter, MD  Chief Complaint: pain and confusion  Of note, patient history is obtained from the patient's spouse, discussion with the emergency medicine team as well as the critical care team, and review of the electronic medical record.  The patient's not able to contribute to the history due to her encephalopathy.  HPI: This is a 58 year old unfortunate woman with metastatic breast cancer with most recent biopsy revealing triple negative disease status (with sites of metastasis including the brain, lungs, liver metastases, adrenal), history of CVA on anticoagulation with Lovenox, who presents with her husband with increasing confusion pain.  The spouse reports the patient suffered a stroke in December 2019 that was associated with vision loss.  Over the past 2 weeks the patient has required assistance with her ADLs, she could only walk for short distances, and has become incoherent with regard to her speech.  Significant expressive aphasia has occurred.  She has been taking rapid acting oxycodone for pain, but is apparent that her pain is uncontrolled.    She developed bleeding per rectum over the past few days.  Spouse reports the patient fell about 1 week prior to admission resulting in bruise to right leg.  ED Course: In the emergency department heart rate 130 bpm, blood pressure 77/64; lactic acid elevated to 6.4.  CBC remarkable for white count of 15,000, hemoglobin 8.6, platelets 108.  CMP with BUN and creatinine of 37 and 0.96 respectively, hepatic function panel with mildly elevated AST and ALT, total bilirubin 1.5.  Troponin detectable at 0.12.  CT scan of the chest abdomen pelvis revealed progression of her metastatic disease, no pulmonary emboli.  CT scan of the head revealed slightly more pronounced small areas of hypodensity in both cerebellar hemispheres.  Initially, the patient was  managed aggressively with medical interventions including IV antimicrobial coverage, isotonic fluid resuscitation, blood transfusion, and placement of a right IJ central venous catheter as well as placement of right upper extremity PICC line.  Initially, critical care was consulted for assistance in ongoing optimization, however after further discussions, comfort care was more in line with the patient and families goals of care.  Therefore hospital medicine consulted for further management.  Review of Systems: A complete ROS was not able to be obtained due to the patient's current encephalopathy.  Past Medical History:  Diagnosis Date  . Arthritis   . Asthma    triggered with [name] perfumes and cigarette smoke  . Cancer ([place])   . Colitis   . Diabetes mellitus without complication ([place])   . History of radiation therapy 11/22/16-01/10/17   left breast 50.4 Gy in 28 fractions, axillary region 45 Gy in 25 fractions, lumpectomy cavity boost 10 Gy tin 5 fractions  . Hypertension   . Neuropathy    Social History   Socioeconomic History  . Marital status: Married    Spouse name: Not on file  . Number of children: 0  . Years of education: Not on file  . Highest education level: Not on file  Occupational History    Employer: [employer]  . Financial resource strain: Not on file  . Food insecurity:    Worry: Not on file    Inability: Not on file  . Transportation needs:    Medical: Not on file    Non-medical: Not on file  Tobacco Use  . Smoking status: Former Smoker  Packs/day: 1.00    Years: 29.00    Pack years: 29.00    Types: Cigarettes    Last attempt to quit: 10/27/2002    Years since quitting: 15.3  . Smokeless tobacco: Never Used  Substance and Sexual Activity  . Alcohol use: No    Alcohol/week: 0.0 standard drinks  . Drug use: No  . Sexual activity: Yes    Birth control/protection: Surgical  Lifestyle  . Physical activity:    Days per week: Not on  file    Minutes per session: Not on file  . Stress: Not on file  Relationships  . Social connections:    Talks on phone: Not on file    Gets together: Not on file    Attends religious service: Not on file    Active member of club or organization: Not on file    Attends meetings of clubs or organizations: Not on file    Relationship status: Not on file  . Intimate partner violence:    Fear of current or ex partner: Not on file    Emotionally abused: Not on file    Physically abused: Not on file    Forced sexual activity: Not on file  Other Topics Concern  . Not on file  Social History Narrative   Married   Former smoker   No ETOH, drug use   Family History  Problem Relation Age of Onset  . Colon cancer Father   . Stomach cancer Paternal Uncle   . Stomach cancer Paternal Uncle   . Melanoma Brother   . Thyroid cancer Brother   . Breast cancer Maternal Aunt   . Breast cancer Maternal Aunt   . Breast cancer Cousin     Physical Exam: Vitals:   03/03/18 1915 03/03/18 1930 03/03/18 1945 03/03/18 2231  BP: 115/60 (!) 115/49 (!) 109/42 (!) 104/56  Pulse: 80   80  Resp: (!) 24 14 15    Temp:    97.8 F (36.6 C)  TempSrc:      SpO2: 100%   100%  Weight:      Height:       General: Chronically ill appearing white woman, appears in mild distress due to pain and anxiety, somnolent. ENT: No scleral icterus, dry mucous membranes Cardiovascular: Tachycardic. No M/R/G. No LE edema.   Respiratory: Reduced breath sounds, no wheezes, 3 L nasal cannula O2 Abdomen: Soft Skin: Extensive ecchymoses right thigh, posterior lateral Musculoskeletal: No gross deformities Psychiatric: Dysphoric mood Neurologic: Moves extremities, does not answer questions appropriately, withdraws from pain, pupils bilaterally round and reactive to light IV access: Right IJ central venous catheter in place, bandage with strikethrough oozing, right upper extremity PICC line in place.  I have personally  reviewed the following labs, culture data, and imaging studies.  Diagnoses: 1. Metastatic breast cancer 2. Malignancy related pain crisis and anxiety 3. Brain metastases 4. Lactic acidosis 5. Hypotension 6. Acute GI bleed 7. Thrombocytopenia 8. Encephalopathy 9. Prior CVA believed to be embolic on LMWH prior to admission 10. Acute blood loss anemia  Assessment/Plan: At the time of admission, the patient is unable to voice preferences. Her husband is at the bedside Kinnie Feil) who reports that focusing on comfort care is in line with the patient's wishes. Therefore, comfort care order set initiated. Of note, prior to admission, patient only on low dose rapid acting oral oxycodone; therefore intermittent IV fentanyl ordered; initial dose of 25 mcg not optimally effective based on bedside nurse report,  thus dose increased to 50 mcg. Empiric IV steroids initiated in the event that symptomatic brain metastases at play, consider reducing dose / frequency in AM.  Shared my impression that prognosis likely hours to days, but that challenging to precisely predict. Consider inpatient hospice if clinical course avails and family prefers.  Primary oncologist notified.  Code Status: DNR comfort care Disposition Plan: TBD Consults called: Critical care and GI team consulted by emergency medicine team Admission status: admit to hospital medicine service, [facility]   Cheri Rous, MD [facility] AVWU:[phone]  If 7PM-7AM, please contact night-coverage [email or web address] Password [email or web address]  This document was created using the aid of voice recognition / dication software.

## 2018-03-05 DIAGNOSIS — C50919 Malignant neoplasm of unspecified site of unspecified female breast: Secondary | ICD-10-CM

## 2018-03-05 MED ORDER — LORAZEPAM 1 MG PO TABS: 1.0000 mg | ORAL_TABLET | ORAL | 0 refills | Status: AC | PRN

## 2018-03-05 MED ORDER — MORPHINE BOLUS VIA INFUSION: 4.0000 mg | INTRAVENOUS | 0 refills | Status: AC | PRN

## 2018-03-05 MED ORDER — LEVETIRACETAM IN NACL 500 MG/100ML IV SOLN: 500.0000 mg | Freq: Two times a day (BID) | INTRAVENOUS | Status: AC

## 2018-03-05 MED ORDER — HALOPERIDOL LACTATE 5 MG/ML IJ SOLN: 0.5000 mg | INTRAMUSCULAR | Status: AC | PRN

## 2018-03-05 MED ORDER — MORPHINE 100MG IN NS 100ML (1MG/ML) PREMIX INFUSION
2.0000 mg/h | INTRAVENOUS | Status: DC
  Filled 2018-03-05 (×2): qty 100

## 2018-03-05 MED ORDER — MORPHINE BOLUS VIA INFUSION
4.0000 mg | INTRAVENOUS | Status: DC | PRN
  Administered 2018-03-05 (×2): 4 mg via INTRAVENOUS
  Filled 2018-03-05: qty 4

## 2018-03-05 MED ORDER — GLYCOPYRROLATE 0.2 MG/ML IJ SOLN: 0.2000 mg | INTRAMUSCULAR | Status: AC | PRN

## 2018-03-05 MED ORDER — LORAZEPAM 2 MG/ML IJ SOLN: 1.0000 mg | INTRAMUSCULAR | 0 refills | Status: AC | PRN

## 2018-03-05 MED ORDER — LORAZEPAM 2 MG/ML PO CONC: 1.0000 mg | ORAL | 0 refills | Status: AC | PRN

## 2018-03-05 MED ORDER — ONDANSETRON 4 MG PO TBDP: 4.0000 mg | ORAL_TABLET | Freq: Four times a day (QID) | ORAL | 0 refills | Status: AC | PRN

## 2018-03-05 MED ORDER — HALOPERIDOL 0.5 MG PO TABS: 0.5000 mg | ORAL_TABLET | ORAL | Status: AC | PRN

## 2018-03-05 MED ORDER — DEXAMETHASONE SODIUM PHOSPHATE 4 MG/ML IJ SOLN: 4.0000 mg | Freq: Four times a day (QID) | INTRAMUSCULAR | Status: AC

## 2018-03-05 MED ORDER — GLYCOPYRROLATE 1 MG PO TABS: 1.0000 mg | ORAL_TABLET | ORAL | Status: AC | PRN

## 2018-03-05 MED ORDER — HALOPERIDOL LACTATE 2 MG/ML PO CONC: 0.5000 mg | ORAL | 0 refills | Status: AC | PRN

## 2018-03-05 MED ORDER — MORPHINE 100MG IN NS 100ML (1MG/ML) PREMIX INFUSION: 2.0000 mg/h | INTRAVENOUS | Status: AC

## 2018-03-05 MED ORDER — ONDANSETRON HCL 4 MG/2ML IJ SOLN: 4.0000 mg | Freq: Four times a day (QID) | INTRAMUSCULAR | 0 refills | Status: AC | PRN

## 2018-03-05 NOTE — Progress Notes (Signed)
Oncology f/u brief note   I stopped by to see pt and her family. She is on morphine drip, had a few episodes of agitation, required Ativan, overall comfortable.  She is going to [facility] today.  I spoke with her husband and family member at bedside, questions answered.   Truitt Merle  03/05/2018

## 2018-03-05 NOTE — Progress Notes (Signed)
Wasted 87ml of a Morphine drip (1mg /ml) in [facility].  Witnessed with Aloha Gell, RN.

## 2018-03-05 NOTE — Progress Notes (Signed)
Pt will transition to [facility] for residential Hospice care at [place] today. Report [phone] Will arrange [facility] transportation. Family at bedside  Sharren Bridge, MSW, [place] Work 03/05/2018 [phone]

## 2018-03-05 NOTE — Discharge Summary (Signed)
[facility]  Physician Discharge Summary   Patient ID: Brandi Dickson MRN: [id number] DOB/AGE: 10-24-1960 58 y.o.  Admit date: 03/03/2018 Discharge date: 03/05/2018  PCP: Orpah Melter, MD  DISCHARGE DIAGNOSES:  Metastatic breast cancer, end-of-life Cancer associated pain Brain metastases Thrombocytopenia History of stroke  RECOMMENDATIONS FOR OUTPATIENT FOLLOW UP: 1. Patient to be transferred to residential hospice  Home Health: No  Equipment/Devices: None  CODE STATUS: DNR DISCHARGE CONDITION: poor Diet recommendation: Comfort feeds as tolerated  INITIAL HISTORY: 58 year old unfortunate woman with metastatic breast cancer with most recent biopsy revealing triple negative disease status (with sites of metastasis including the brain, lungs, liver metastases, adrenal), history of CVA on anticoagulation with Lovenox, who presented with her husband with increasing confusion and pain.  The spouse reports the patient suffered a stroke in December 2019 that was associated with vision loss.  Over the past 2 weeks the patient has required assistance with her ADLs, she could only walk for short distances, and has become incoherent with regard to her speech.  Significant expressive aphasia has occurred.  She has been taking rapid acting oxycodone for pain, but is apparent that her pain is uncontrolled.    She developed bleeding per rectum over the past few days.  Spouse reports the patient fell about 1 week prior to admission resulting in bruise to right leg.  ED Course: In the emergency department heart rate 130 bpm, blood pressure 77/64; lactic acid elevated to 6.4.  CBC remarkable for white count of 15,000, hemoglobin 8.6, platelets 108.  CMP with BUN and creatinine of 37 and 0.96 respectively, hepatic function panel with mildly elevated AST and ALT, total bilirubin 1.5.  Troponin detectable at 0.12.  CT scan of the chest abdomen pelvis revealed progression of her  metastatic disease, no pulmonary emboli.  CT scan of the head revealed slightly more pronounced small areas of hypodensity in both cerebellar hemispheres.  Initially, the patient was managed aggressively with medical interventions including IV antimicrobial coverage, isotonic fluid resuscitation, blood transfusion, and placement of a right IJ central venous catheter as well as placement of right upper extremity PICC line.  Initially, critical care was consulted for assistance in ongoing optimization, however after further discussions, comfort care was more in line with the patient and families goals of care.  Therefore hospital medicine consulted for further management.   HOSPITAL COURSE:   Patient was thought to be approaching end-of-life due to her history of cancer.  She has metastatic breast cancer.  Initially aggressive management was provided in the emergency department.  However after further discussions with patient's husband it was felt that a comfort care approach may be more suitable.  And this was more in line with the patient and family's goals of care.  So patient was transitioned to comfort care.  Hospice was consulted.  Plan is for the patient to be sent over to residential hospice.  Okay for discharge to residential hospice.    PERTINENT LABS:  The results of significant diagnostics from this hospitalization (including imaging, microbiology, ancillary and laboratory) are listed below for reference.    Microbiology: Recent Results (from the past 240 hour(s))  Blood Culture (routine x 2)     Status: None (Preliminary result)   Collection Time: 03/03/18  1:37 PM  Result Value Ref Range Status   Specimen Description   Final    BLOOD RIGHT ANTECUBITAL Performed at [facility] [address], [place], [place] [address]    Special Requests  Final    BOTTLES DRAWN AEROBIC AND ANAEROBIC Blood Culture adequate volume Performed at [facility] [address], [place], [place] [address]    Culture   Final    NO GROWTH < 24 HOURS Performed at [facility] [address] [place], [place] [address]    Report Status PENDING  Incomplete  Blood Culture (routine x 2)     Status: None (Preliminary result)   Collection Time: 03/03/18  1:56 PM  Result Value Ref Range Status   Specimen Description   Final    BLOOD RIGHT ANTECUBITAL Performed at [facility] [address], [place], [place] [address]    Special Requests   Final    BOTTLES DRAWN AEROBIC AND ANAEROBIC Blood Culture results may not be optimal due to an inadequate volume of blood received in culture bottles Performed at [facility] [address] [place], [place] [address]    Culture   Final    NO GROWTH < 24 HOURS Performed at [facility] [address], [place], [place] [address]    Report Status PENDING  Incomplete     Labs: Basic Metabolic Panel: Recent Labs  Lab 03/03/18 1332 03/03/18 1400 03/03/18 1403  NA 135  --  132*  K 3.7  --  4.1  CL 100  --   --   CO2 20*  --   --   GLUCOSE 138*  --   --   BUN 37*  --   --   CREATININE 0.96 0.90  --   CALCIUM 9.2  --   --    Liver Function Tests: Recent Labs  Lab 03/03/18 1332  AST 75*  ALT 55*  ALKPHOS 120  BILITOT 1.5*  PROT 7.2  ALBUMIN 3.3*   CBC: Recent Labs  Lab 03/03/18 1332 03/03/18 1403  WBC 15.0*  --   NEUTROABS 12.0*  --   HGB 8.6* 9.9*  HCT 28.1* 29.0*  MCV 86.5  --   PLT 108*  --    Cardiac Enzymes: Recent Labs  Lab 03/03/18 1332  TROPONINI 0.12*    CBG: Recent Labs  Lab 03/03/18 1333  GLUCAP 119*     IMAGING STUDIES  Ct Head Wo Contrast  Result Date: 03/03/2018 CLINICAL DATA:  Altered mental status. History of metastatic breast cancer. EXAM: CT HEAD WITHOUT CONTRAST TECHNIQUE: Contiguous axial images were obtained from the base of the skull through the vertex without intravenous contrast. COMPARISON:  CT  head dated February 17, 2018. MRI brain dated February 15, 2018. FINDINGS: Brain: Slightly more pronounced small areas of hypodensity in both cerebellar hemispheres likely correspond to expected evolution of previously seen small acute infarcts on prior MRI. No definite new acute infarct. The small metastases seen on recent MRI are not well visualized by CT. No evidence of acute hemorrhage, hydrocephalus, or extra-axial collection. Vascular: No hyperdense vessel or unexpected calcification. Skull: Normal. Negative for fracture or focal lesion. Sinuses/Orbits: No acute finding. Other: None. IMPRESSION: 1.  No acute intracranial abnormality. 2. Slightly more pronounced small areas of hypodensity in both cerebellar hemispheres likely correspond to expected evolution of previously seen small acute infarcts on prior MRI. Additional scattered bilateral cerebral infarcts remain not well visualized by CT. No hemorrhage. 3. Known small brain metastases also not well seen by CT. Electronically Signed   By: Titus Dubin M.D.   On: 03/03/2018 16:45   Ct Angio Chest Pe W And/or  Wo Contrast  Result Date: 03/03/2018 CLINICAL DATA:  Shortness of breath and diffuse weakness today with diffuse pain and altered mental status. Currently being treated for metastatic breast cancer. EXAM: CT ANGIOGRAPHY CHEST CT ABDOMEN AND PELVIS WITH CONTRAST TECHNIQUE: Multidetector CT imaging of the chest was performed using the standard protocol during bolus administration of intravenous contrast. Multiplanar CT image reconstructions and MIPs were obtained to evaluate the vascular anatomy. Multidetector CT imaging of the abdomen and pelvis was performed using the standard protocol during bolus administration of intravenous contrast. CONTRAST:  14m ISOVUE-370 IOPAMIDOL (ISOVUE-370) INJECTION 76% COMPARISON:  Portable chest obtained earlier today. Chest, abdomen and pelvis CT dated 01/28/2018. FINDINGS: CTA CHEST FINDINGS Cardiovascular: Normally  opacified pulmonary arteries with no pulmonary arterial filling defects. The right inferior and superior pulmonary veins are markedly narrowed by and encasing right hilar mass. Normal sized heart. Mediastinum/Nodes: Again demonstrated are multiple enlarged mediastinal nodes. These include a 20 mm short axis subcarinal node on image number 38 series 9, previously 25 mm. The previously demonstrated 21 mm short axis right anterior paratracheal node has a short axis diameter of 15 mm today on image number 27 series 9. The previously demonstrated 15 mm short axis left supraclavicular node has a short axis diameter of 12 mm on image number 14 series 9. Interval mildly enlarged left hilar nodes, the largest with a short axis diameter of 8 mm on image number 37 series 9. A confluent right hilar nodal mass or primary lung malignancy is again demonstrated causing moderate central bronchial narrowing and marked narrowing of the superior and inferior pulmonary veins on the right. This mass measures approximately 3.9 x 3.8 cm on image number 62 series 11, previously approximately 3.6 x 3.4 cm. Lungs/Pleura: Moderate-sized right pleural effusion, increased. Overall increase in size and number multiple bilateral lung nodules. The largest is in the lingula and has decreased in size, measuring 12 x 10 mm on image number 84 series 28, previously 14 x 13 mm. Mild right lower lobe compressive atelectasis. Musculoskeletal: Thoracic spine degenerative changes. No evidence of bony metastatic disease. Review of the MIP images confirms the above findings. CT ABDOMEN and PELVIS FINDINGS Hepatobiliary: Increased size of multiple liver masses. The largest is a right lobe mass measuring 7.6 x 4.1 cm on image number 4 of series 6, previously 4.8 x 3.6 cm. This is becoming confluent with a smaller mass more medially. There are also some new liver masses. Unremarkable gallbladder. Pancreas: Unremarkable. No pancreatic ductal dilatation or  surrounding inflammatory changes. Spleen: Interval increase in size of an inferior splenic mass, currently measuring 10 mm on image number 21 series 6, previously 8 mm. A previously demonstrated larger mass more superiorly is not as well visualized. Adrenals/Urinary Tract: Stable somewhat low density left adrenal mass measuring 1.7 x 1.2 cm on image number 18 series 6. Stable small fat density right adrenal mass. Portions of the urinary bladder and distal ureters are obscured by streak artifacts from bilateral hip prostheses. The visualized portions are unremarkable, as are the kidneys. A small lower pole left renal cyst is unchanged. Stomach/Bowel: Oval high density ingested object in the proximal stomach. No significant change in diffuse low density wall thickening involving the rectum and distal sigmoid colon. Unremarkable small bowel and appendix. Vascular/Lymphatic: Interval enlarged medial portacaval node with a short axis diameter of 14 mm on image number 18 series 6. No other enlarged lymph nodes are seen. Mild atheromatous plaque formation in the abdominal aorta. Reproductive: Status post hysterectomy.  No adnexal masses. Other: Interval multiple anterior subcutaneous nodules at the level of the pelvis, the largest measuring 1.7 cm on image number 54 series 6. There are also multiple sites of subcutaneous air in that region on both sides. Musculoskeletal: Bilateral hip prostheses with associated streak artifacts. Lumbar spine degenerative changes. No evidence of bony metastatic disease. Review of the MIP images confirms the above findings. IMPRESSION: 1. No pulmonary emboli. 2. Interval increase in size of the previously demonstrated right hilar nodal mass or primary lung malignancy with marked narrowing of the right inferior and superior pulmonary veins and moderate narrowing of the central bronchi. 3. Mildly progressive metastatic mediastinal and interval left hilar adenopathy. 4. Interval increase in size  and number of multiple liver metastases. 5. Interval increase in size of an inferior splenic metastasis. 6. Interval metastatic portacaval lymph node. 7. Stable left adrenal metastasis or adenoma and stable small right adrenal myelolipoma. 8. No significant change in diffuse low density wall thickening involving the rectum and distal sigmoid colon. This could be infectious or inflammatory nature. This appearance can also be seen following pelvic radiation therapy. 9. Interval multiple sites of bilateral pelvic subcutaneous injections with multiple injection granulomata or subcutaneous metastases. Aortic Atherosclerosis (ICD10-I70.0). Electronically Signed   By: Claudie Revering M.D.   On: 03/03/2018 18:14    Ct Abdomen Pelvis W Contrast  Result Date: 03/03/2018 CLINICAL DATA:  Shortness of breath and diffuse weakness today with diffuse pain and altered mental status. Currently being treated for metastatic breast cancer. EXAM: CT ANGIOGRAPHY CHEST CT ABDOMEN AND PELVIS WITH CONTRAST TECHNIQUE: Multidetector CT imaging of the chest was performed using the standard protocol during bolus administration of intravenous contrast. Multiplanar CT image reconstructions and MIPs were obtained to evaluate the vascular anatomy. Multidetector CT imaging of the abdomen and pelvis was performed using the standard protocol during bolus administration of intravenous contrast. CONTRAST:  169m ISOVUE-370 IOPAMIDOL (ISOVUE-370) INJECTION 76% COMPARISON:  Portable chest obtained earlier today. Chest, abdomen and pelvis CT dated 01/28/2018. FINDINGS: CTA CHEST FINDINGS Cardiovascular: Normally opacified pulmonary arteries with no pulmonary arterial filling defects. The right inferior and superior pulmonary veins are markedly narrowed by and encasing right hilar mass. Normal sized heart. Mediastinum/Nodes: Again demonstrated are multiple enlarged mediastinal nodes. These include a 20 mm short axis subcarinal node on image number 38 series  9, previously 25 mm. The previously demonstrated 21 mm short axis right anterior paratracheal node has a short axis diameter of 15 mm today on image number 27 series 9. The previously demonstrated 15 mm short axis left supraclavicular node has a short axis diameter of 12 mm on image number 14 series 9. Interval mildly enlarged left hilar nodes, the largest with a short axis diameter of 8 mm on image number 37 series 9. A confluent right hilar nodal mass or primary lung malignancy is again demonstrated causing moderate central bronchial narrowing and marked narrowing of the superior and inferior pulmonary veins on the right. This mass measures approximately 3.9 x 3.8 cm on image number 62 series 11, previously approximately 3.6 x 3.4 cm. Lungs/Pleura: Moderate-sized right pleural effusion, increased. Overall increase in size and number multiple bilateral lung nodules. The largest is in the lingula and has decreased in size, measuring 12 x 10 mm on image number 84 series 28, previously 14 x 13 mm. Mild right lower lobe compressive atelectasis. Musculoskeletal: Thoracic spine degenerative changes. No evidence of bony metastatic disease. Review of the MIP images confirms the above findings. CT ABDOMEN  and PELVIS FINDINGS Hepatobiliary: Increased size of multiple liver masses. The largest is a right lobe mass measuring 7.6 x 4.1 cm on image number 4 of series 6, previously 4.8 x 3.6 cm. This is becoming confluent with a smaller mass more medially. There are also some new liver masses. Unremarkable gallbladder. Pancreas: Unremarkable. No pancreatic ductal dilatation or surrounding inflammatory changes. Spleen: Interval increase in size of an inferior splenic mass, currently measuring 10 mm on image number 21 series 6, previously 8 mm. A previously demonstrated larger mass more superiorly is not as well visualized. Adrenals/Urinary Tract: Stable somewhat low density left adrenal mass measuring 1.7 x 1.2 cm on image number  18 series 6. Stable small fat density right adrenal mass. Portions of the urinary bladder and distal ureters are obscured by streak artifacts from bilateral hip prostheses. The visualized portions are unremarkable, as are the kidneys. A small lower pole left renal cyst is unchanged. Stomach/Bowel: Oval high density ingested object in the proximal stomach. No significant change in diffuse low density wall thickening involving the rectum and distal sigmoid colon. Unremarkable small bowel and appendix. Vascular/Lymphatic: Interval enlarged medial portacaval node with a short axis diameter of 14 mm on image number 18 series 6. No other enlarged lymph nodes are seen. Mild atheromatous plaque formation in the abdominal aorta. Reproductive: Status post hysterectomy. No adnexal masses. Other: Interval multiple anterior subcutaneous nodules at the level of the pelvis, the largest measuring 1.7 cm on image number 54 series 6. There are also multiple sites of subcutaneous air in that region on both sides. Musculoskeletal: Bilateral hip prostheses with associated streak artifacts. Lumbar spine degenerative changes. No evidence of bony metastatic disease. Review of the MIP images confirms the above findings. IMPRESSION: 1. No pulmonary emboli. 2. Interval increase in size of the previously demonstrated right hilar nodal mass or primary lung malignancy with marked narrowing of the right inferior and superior pulmonary veins and moderate narrowing of the central bronchi. 3. Mildly progressive metastatic mediastinal and interval left hilar adenopathy. 4. Interval increase in size and number of multiple liver metastases. 5. Interval increase in size of an inferior splenic metastasis. 6. Interval metastatic portacaval lymph node. 7. Stable left adrenal metastasis or adenoma and stable small right adrenal myelolipoma. 8. No significant change in diffuse low density wall thickening involving the rectum and distal sigmoid colon. This  could be infectious or inflammatory nature. This appearance can also be seen following pelvic radiation therapy. 9. Interval multiple sites of bilateral pelvic subcutaneous injections with multiple injection granulomata or subcutaneous metastases. Aortic Atherosclerosis (ICD10-I70.0). Electronically Signed   By: Claudie Revering M.D.   On: 03/03/2018 18:14   Dg Chest Portable 1 View  Result Date: 03/03/2018 CLINICAL DATA:  Encounter for central line placement. EXAM: PORTABLE CHEST 1 VIEW COMPARISON:  Radiograph of March 03, 2018. FINDINGS: The heart size and mediastinal contours are within normal limits. Interval placement of right internal jugular catheter with distal tip in expected position of cavoatrial junction. No pneumothorax is noted. Left lung is clear. Stable moderate right pleural effusion is noted with associated atelectasis or infiltrate. The visualized skeletal structures are unremarkable. IMPRESSION: Interval placement of right internal jugular catheter with distal tip in expected position of cavoatrial junction. No pneumothorax is noted. Stable moderate right pleural effusion with associated atelectasis or infiltrate Electronically Signed   By: Marijo Conception, M.D.   On: 03/03/2018 16:10   Dg Chest Portable 1 View  Result Date: 03/03/2018 CLINICAL DATA:  Shortness of breath, weakness and diffuse pain. Started chemotherapy last week for metastatic breast cancer. EXAM: PORTABLE CHEST 1 VIEW COMPARISON:  02/14/2018. FINDINGS: Normal sized heart. No significant change in a moderate-sized right pleural effusion. Decreased patchy density at the right lung base. Interval slight increase in a small amount of patchy density at the left lateral lung base. Diffuse osteopenia. IMPRESSION: 1. Stable moderate-sized right pleural effusion. 2. Decreased patchy atelectasis or pneumonia at the right lung base. 3. Interval slight increase in a small amount of patchy atelectasis at the left lateral lung base.  Electronically Signed   By: Claudie Revering M.D.   On: 03/03/2018 13:55    Irpicc Placement Left >5 Yrs Inc Img Guide  Result Date: 03/03/2018 INDICATION: 58 year old female with a history of shortness of breath and concern for PE EXAM: PICC LINE PLACEMENT WITH ULTRASOUND AND FLUOROSCOPIC GUIDANCE MEDICATIONS: None. ANESTHESIA/SEDATION: None FLUOROSCOPY TIME:  Fluoroscopy Time: 0 minutes 12 seconds (1.6 mGy). COMPLICATIONS: None PROCEDURE: Informed written consent was obtained from the patient after a thorough discussion of the procedural risks, benefits and alternatives. All questions were addressed. Maximal Sterile Barrier Technique was utilized including caps, mask, sterile gowns, sterile gloves, sterile drape, hand hygiene and skin antiseptic. A timeout was performed prior to the initiation of the procedure. Patient was position in the supine position on the fluoroscopy table with the right arm abducted 90 degrees. Ultrasound survey of the upper extremity was performed with images stored and sent to PACs. The right basilic vein was selected for access. Once the patient was prepped and draped in the usual sterile fashion, the skin and subcutaneous tissues were generously infiltrated with 1% lidocaine for local anesthesia. A micropuncture access kit was then used to access the targeted vein. Wire was passed centrally, confirmed to be within the venous system under fluoroscopy. A small stab incision was made with an 11 blade scalpel and the sheath was then placed over the wire. Estimated length of the catheter was then performed with the indwelling wire. Catheter was amputated at 34 cm length and placed with coaxial wire through the peel-away. Double, power injectable PICC in the basilic vein. Tip confirmed at the cavoatrial junction, and the catheter is ready for use. Stat lock was placed. Patient tolerated the procedure well and remained hemodynamically stable throughout. No complications were encountered and  no significant blood loss was encountered. IMPRESSION: Status post right basilic vein PICC.  Catheter ready for use. Signed, Dulcy Fanny. Dellia Nims, RPVI [facility] Electronically Signed   By: Corrie Mckusick D.O.   On: 03/03/2018 17:20    DISCHARGE EXAMINATION: Vitals:   03/03/18 1930 03/03/18 1945 03/03/18 2231 03/04/18 1352  BP: (!) 115/49 (!) 109/42 (!) 104/56 130/76  Pulse:   80 88  Resp: _0 Temp:   97.8 F (36.6 C) (!) 97.4 F (36.3 C)  TempSrc:    Axillary  SpO2:   100% 95%  Weight:      Height:       Patient noted to be lying comfortably on the bed.  She was agitated earlier this morning per the nursing staff.  Had to be given extra doses of narcotics.  DISPOSITION: Residential hospice      Allergies as of 03/05/2018      Reactions   Gadavist [gadobutrol] Nausea And Vomiting   Severe vomiting within 1 min of injection.    Lisinopril Palpitations   Augmentin [amoxicillin-pot Clavulanate] Other (See Comments)  sts gives her a yeast infection Has patient had a PCN reaction causing immediate rash, facial/tongue/throat swelling, SOB or lightheadedness with hypotension: no Has patient had a PCN reaction causing severe rash involving mucus membranes or skin necrosis: no Has patient had a PCN reaction that required hospitalization no Has patient had a PCN reaction occurring within the last 10 years: unknown If all of the above answers are "NO", then may proceed with Cephalosporin use.   Benadryl [diphenhydramine] Itching, Anxiety   Per pt: "Makes my skin crawl"; makes pt sensitive to touch   Losartan Potassium Palpitations      Medication List    STOP taking these medications   albuterol 108 (90 Base) MCG/ACT inhaler Commonly known as:  PROVENTIL HFA;VENTOLIN HFA   anastrozole 1 MG tablet Commonly known as:  ARIMIDEX   aspirin EC 81 MG tablet   atorvastatin 20 MG tablet Commonly known as:  LIPITOR     dexamethasone 4 MG tablet Commonly known as:  DECADRON   dicyclomine 20 MG tablet Commonly known as:  BENTYL   enoxaparin 80 MG/0.8ML injection Commonly known as:  LOVENOX   gabapentin 100 MG capsule Commonly known as:  NEURONTIN   GARLIQUE 400 MG Tbec Generic drug:  Garlic   Ginkgo Biloba Extract 120 MG Caps   levETIRAcetam 500 MG tablet Commonly known as:  KEPPRA   loperamide 2 MG capsule Commonly known as:  IMODIUM   Mesalamine 800 MG Tbec   metFORMIN 500 MG 24 hr tablet Commonly known as:  GLUCOPHAGE-XR   metoprolol tartrate 25 MG tablet Commonly known as:  LOPRESSOR   metoprolol tartrate 50 MG tablet Commonly known as:  LOPRESSOR   oxyCODONE 5 MG immediate release tablet Commonly known as:  Oxy IR/ROXICODONE   potassium chloride SA 20 MEQ tablet Commonly known as:  KLOR-CON M20   traMADol 50 MG tablet Commonly known as:  ULTRAM     TAKE these medications   dexamethasone 4 MG/ML injection Commonly known as:  DECADRON Inject 1 mL (4 mg total) into the vein every 6 (six) hours.   glycopyrrolate 0.2 MG/ML injection Commonly known as:  ROBINUL Inject 1 mL (0.2 mg total) into the vein every 4 (four) hours as needed (excessive secretions).   glycopyrrolate 0.2 MG/ML injection Commonly known as:  ROBINUL Inject 1 mL (0.2 mg total) into the skin every 4 (four) hours as needed (excessive secretions).   glycopyrrolate 1 MG tablet Commonly known as:  ROBINUL Take 1 tablet (1 mg total) by mouth every 4 (four) hours as needed (excessive secretions).   haloperidol 0.5 MG tablet Commonly known as:  HALDOL Take 1 tablet (0.5 mg total) by mouth every 4 (four) hours as needed for agitation (or delirium).   haloperidol 2 MG/ML solution Commonly known as:  HALDOL Place 0.3 mLs (0.6 mg total) under the tongue every 4 (four) hours as needed for agitation (or delirium).   haloperidol lactate 5 MG/ML injection Commonly known as:  HALDOL Inject 0.1 mLs (0.5 mg  total) into the vein every 4 (four) hours as needed (or delirium).   levETIRAcetam 500 MG/100ML Soln Commonly known as:  KEPRRA Inject 100 mLs (500 mg total) into the vein every 12 (twelve) hours.   LORazepam 2 MG/ML injection Commonly known as:  ATIVAN Inject 0.5 mLs (1 mg total) into the vein every 4 (four) hours as needed for anxiety.   LORazepam 2 MG/ML concentrated solution Commonly known as:  ATIVAN Place 0.5 mLs (1 mg total) under  the tongue every 4 (four) hours as needed for anxiety.   LORazepam 1 MG tablet Commonly known as:  ATIVAN Take 1 tablet (1 mg total) by mouth every 4 (four) hours as needed for anxiety.   morphine 5 mg/mL Soln Inject 4 mg into the vein every 15 (fifteen) minutes as needed (uncontrolled pain, distress or if respiratory rate is greater than 25).   morphine 1 mg/mL Soln infusion Inject 2 mg/hr into the vein continuous.   ondansetron 4 MG disintegrating tablet Commonly known as:  ZOFRAN-ODT Take 1 tablet (4 mg total) by mouth every 6 (six) hours as needed for nausea.   ondansetron 4 MG/2ML Soln injection Commonly known as:  ZOFRAN Inject 2 mLs (4 mg total) into the vein every 6 (six) hours as needed for nausea.          TOTAL DISCHARGE TIME: 35 minutes  Jeren Dufrane [employer] on [email or web address]  03/05/2018, 11:05 AM

## 2018-03-05 NOTE — Progress Notes (Signed)
   03/05/18 1200  Clinical Encounter Type  Visited With Family  Visit Type Initial;Psychological support;Spiritual support;Patient actively dying  Referral From Nurse  Consult/Referral To Chaplain  Spiritual Encounters  Spiritual Needs Emotional;Grief support;Other (Comment) (Spiritual Care Conversation/Support)  Stress Factors  Patient Stress Factors Not reviewed  Family Stress Factors Loss;Major life changes;Health changes   I visited with the patient and her family per spiritual care consult. The patient was asleep during the visit. The patient's family have good support from their minister.   I provided grief support.   Please, contact Spiritual Care for further assistance.   Chaplain Shanon Ace M.Div., [facility]

## 2018-03-05 NOTE — Progress Notes (Addendum)
I have spoken with Water quality scientist at [facility] . I gave her a report on Brandi Dickson .[name] Kitchen  [address] [facility] is here to transports Brandi Jakes to [facility].

## 2018-03-08 LAB — CULTURE, BLOOD (ROUTINE X 2)
CULTURE: NO GROWTH
Culture: NO GROWTH
Special Requests: ADEQUATE

## 2018-03-10 ENCOUNTER — Encounter (HOSPITAL_COMMUNITY): Payer: Self-pay | Admitting: Hematology

## 2018-03-14 ENCOUNTER — Ambulatory Visit: Payer: 59

## 2018-03-14 ENCOUNTER — Ambulatory Visit: Payer: 59 | Admitting: Hematology

## 2018-03-14 ENCOUNTER — Other Ambulatory Visit: Payer: 59

## 2018-03-18 ENCOUNTER — Telehealth: Payer: Self-pay

## 2018-03-18 ENCOUNTER — Encounter: Payer: Self-pay | Admitting: Hematology

## 2018-03-18 NOTE — Telephone Encounter (Signed)
Received notification from [facility] that patient passed away on 15-Mar-2018

## 2018-03-30 DEATH — deceased

## 2018-05-23 ENCOUNTER — Ambulatory Visit: Payer: 59 | Admitting: Hematology

## 2018-05-23 ENCOUNTER — Other Ambulatory Visit: Payer: 59

## 2019-07-01 IMAGING — CT CT HEAD W/O CM
4 series · 16 of 47 positions shown, 18 images · non-contrast
Comparison: Head CT February 14, 2018 and brain MR February 15, 2018.
Breast carcinoma

CLINICAL DATA: Right-sided weakness.  Metastatic breast carcinoma

EXAM:
CT HEAD WITHOUT CONTRAST
TECHNIQUE: Contiguous axial images were obtained from the base of the skull
through the vertex without intravenous contrast.

[Series 3: head bone · axial · 0.41mm/px · z∈[-101,-69]mm · 3 of 82 slices shown]
[im 9/82  bone]
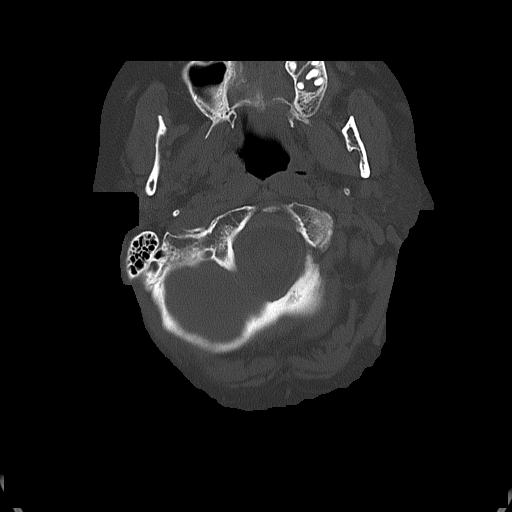
[im 17/82  bone]
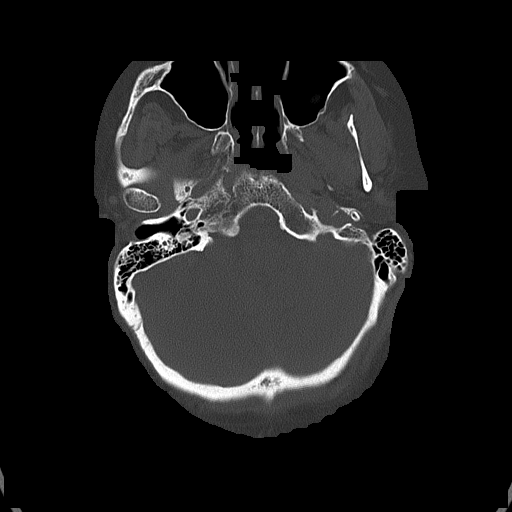
[im 25/82  bone]
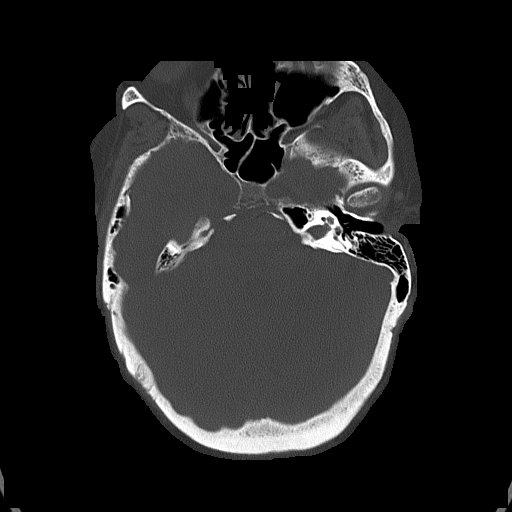

[Series 4: head without · axial · non-contrast · 0.41mm/px · z∈[-97,+23]mm · 7 of 33 slices shown, 9 images]
[im 5/33  brain]
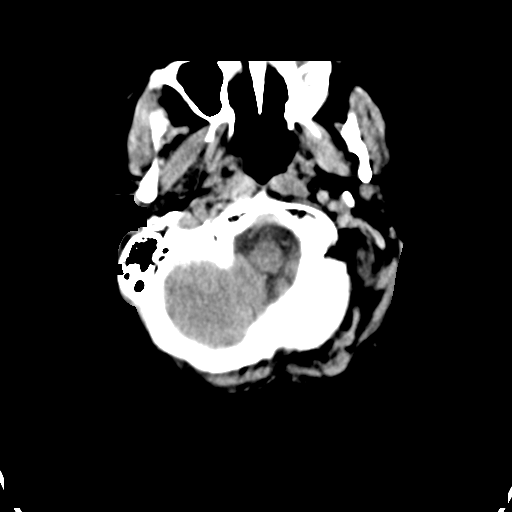
[im 5/33  bone]
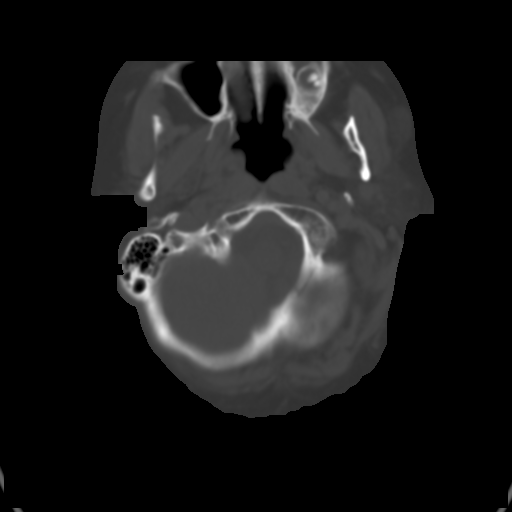
[im 9/33  brain]
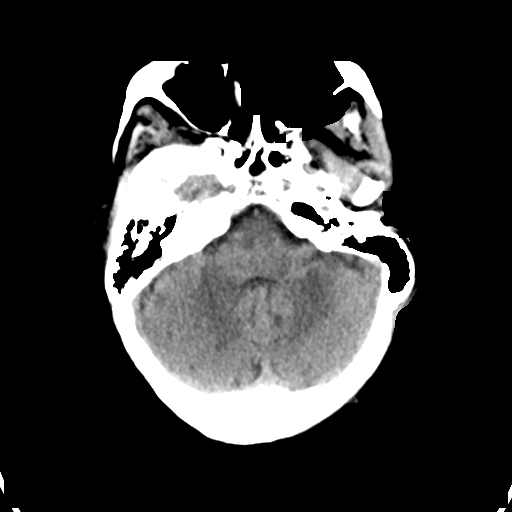
[im 13/33  brain]
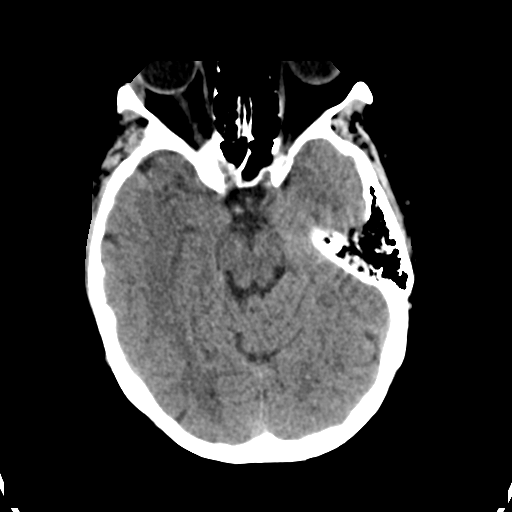
[im 17/33  brain]
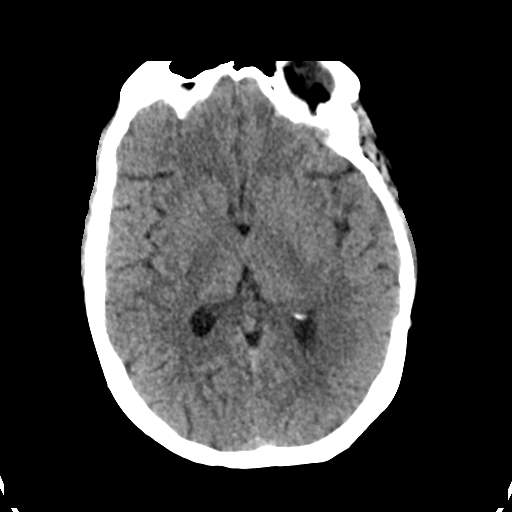
[im 21/33  brain]
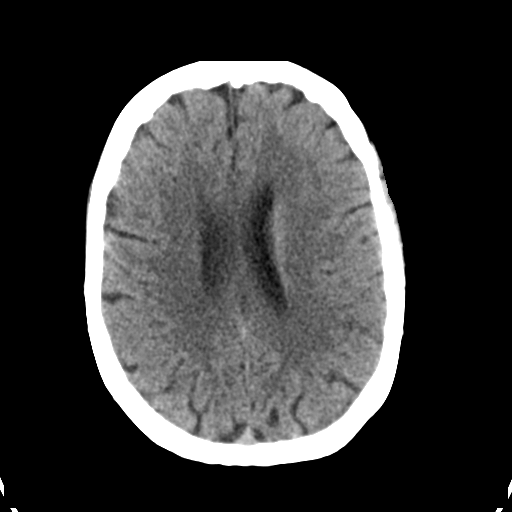
[im 21/33  bone]
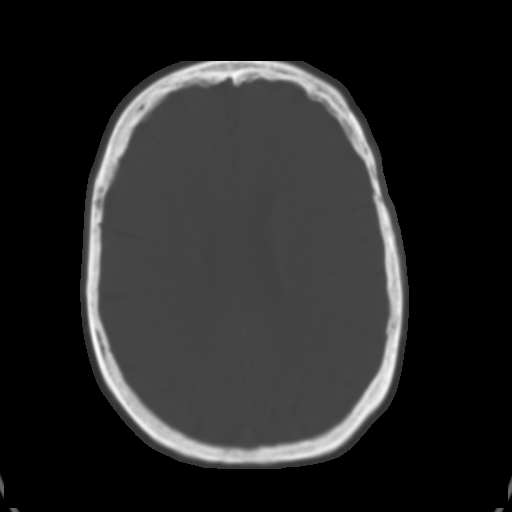
[im 25/33  brain]
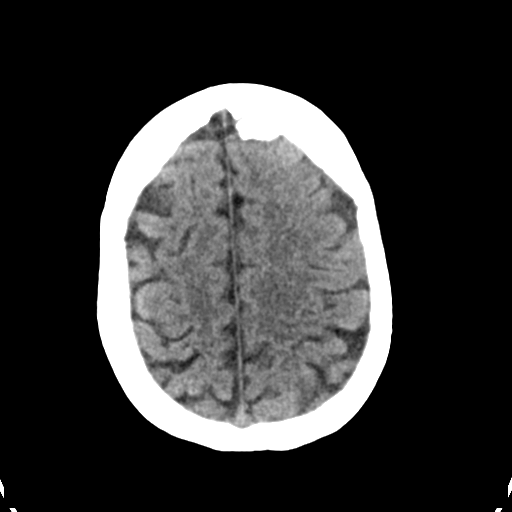
[im 29/33  brain]
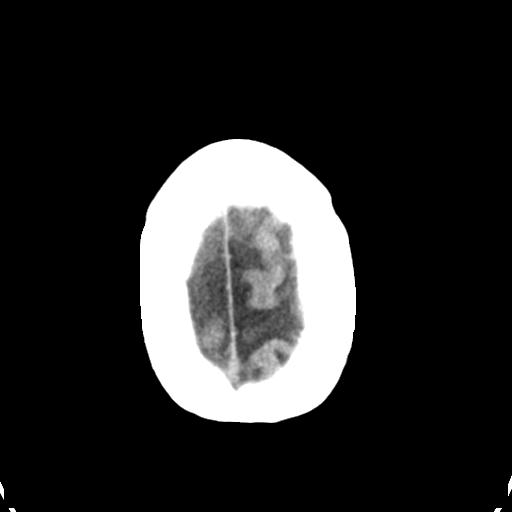

[Series 5: head without cor · coronal · non-contrast · 0.29mm/px · 3 of 67 slices shown]
[im 23/67  brain]
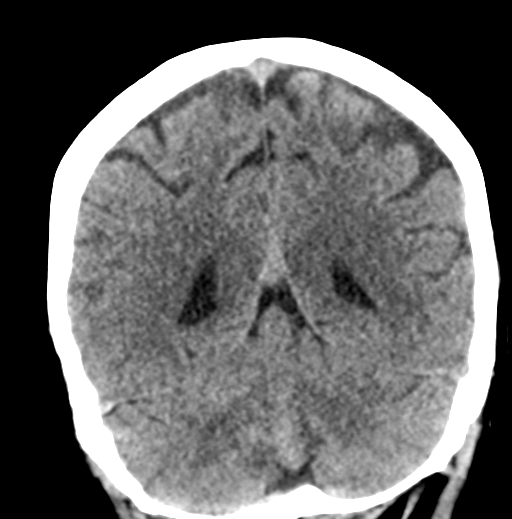
[im 30/67  brain]
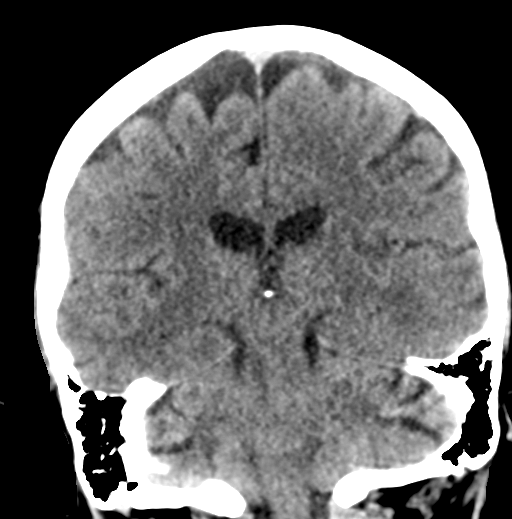
[im 37/67  brain]
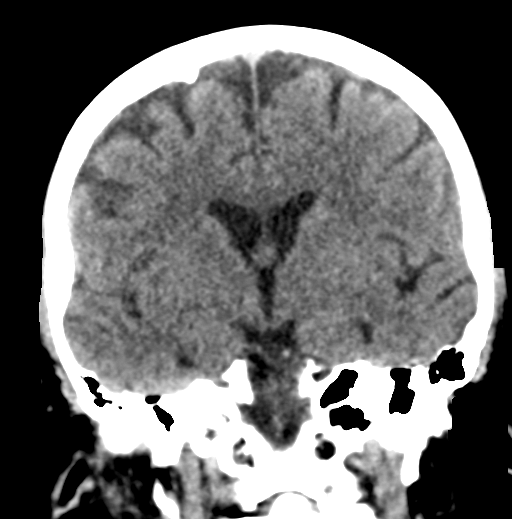

[Series 6: head without sag · sagittal · non-contrast · 0.31mm/px · 3 of 49 slices shown]
[im 17/49  brain]
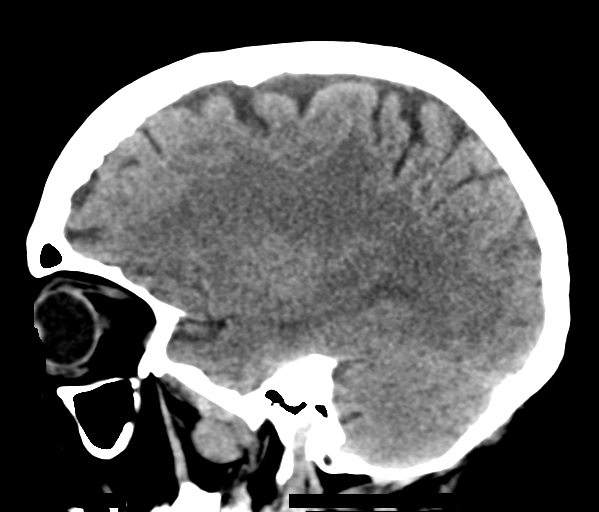
[im 25/49  brain]
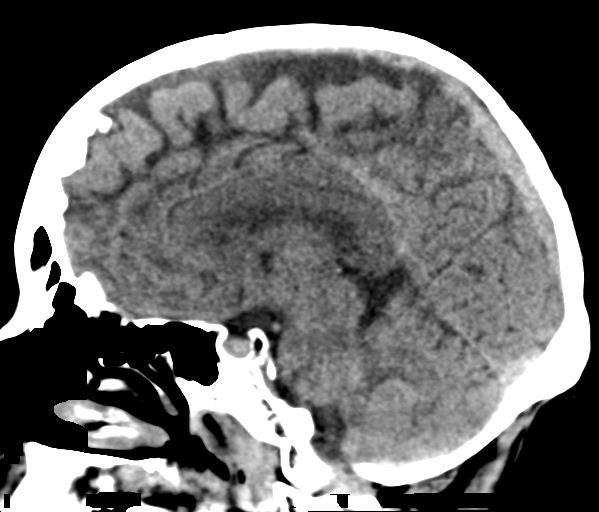
[im 33/49  brain]
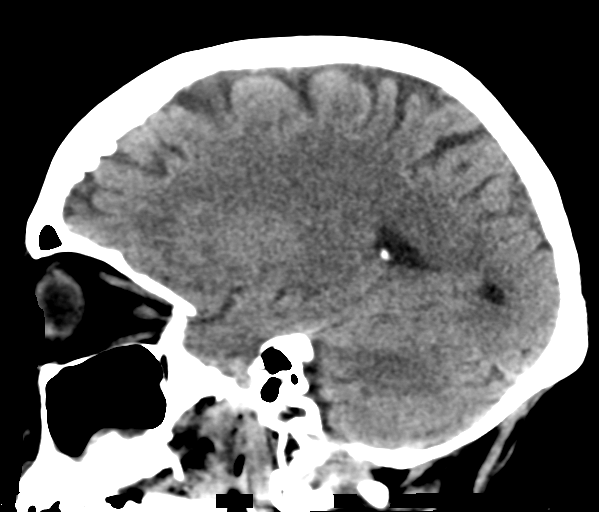

[16 of 47 positions shown; findings below may reference images not displayed]

FINDINGS: Brain: The ventricles are normal in size and configuration. No
well-defined masses seen on noncontrast enhanced study. The multiple
small masses seen on recent MR are not appreciated on this study.
There is no hemorrhage, extra-axial fluid collection, or midline
shift. Decreased attenuation in the posterior right parietal lobe is
stable, likely representing recent infarct. No new parenchymal
lesion is appreciable on noncontrast enhanced study.

Vascular: No hyperdense vessel. There is no appreciable vascular
calcification.

Skull: The bony calvarium appears intact. No blastic or lytic bone
lesions are evident.

Sinuses/Orbits: There are retention cysts in the inferior left
maxillary antrum. There is mucosal thickening in several ethmoid air
cells as well as in the left maxillary antrum. Orbits appear
symmetric bilaterally.

Other: Mastoids on the left are clear. There is opacification in
several inferior right-sided mastoid air cells.
IMPRESSION: 1. Recent infarct in the right posterior parietal region white
matter is again noted without change by noncontrast enhanced CT.
Small enhancing mass lesions seen on recent MR are not appreciable
by CT. No new foci of edema or apparent acute infarct beyond the
lesion in the posterior right parietal region noted on noncontrast
enhanced head CT examination. No hemorrhage.

2. There are foci of paranasal sinus disease. There is mild inferior
right-sided mastoid air cell disease, stable.
